# Patient Record
Sex: Male | Born: 1955 | Race: White | Hispanic: No | Marital: Married | Smoking: Former smoker
Health system: Southern US, Community
[De-identification: ages and names within clinical notes are randomized; demographics above are authoritative.]

## PROBLEM LIST (undated history)

## (undated) DIAGNOSIS — E119 Type 2 diabetes mellitus without complications: Secondary | ICD-10-CM

## (undated) DIAGNOSIS — I2510 Atherosclerotic heart disease of native coronary artery without angina pectoris: Secondary | ICD-10-CM

## (undated) DIAGNOSIS — E785 Hyperlipidemia, unspecified: Secondary | ICD-10-CM

## (undated) DIAGNOSIS — Z789 Other specified health status: Secondary | ICD-10-CM

## (undated) DIAGNOSIS — R570 Cardiogenic shock: Secondary | ICD-10-CM

## (undated) DIAGNOSIS — I509 Heart failure, unspecified: Secondary | ICD-10-CM

## (undated) HISTORY — PX: NO PAST SURGERIES: SHX2092

## (undated) HISTORY — PX: CARDIAC CATHETERIZATION: SHX172

## (undated) HISTORY — DX: Cardiogenic shock: R57.0

---

## 2005-01-17 ENCOUNTER — Emergency Department (HOSPITAL_COMMUNITY): Admission: EM | Admit: 2005-01-17 | Discharge: 2005-01-17 | Payer: Self-pay | Admitting: Emergency Medicine

## 2005-08-30 ENCOUNTER — Emergency Department (HOSPITAL_COMMUNITY): Admission: EM | Admit: 2005-08-30 | Discharge: 2005-08-30 | Payer: Self-pay | Admitting: Emergency Medicine

## 2010-08-21 ENCOUNTER — Emergency Department (HOSPITAL_COMMUNITY)
Admission: EM | Admit: 2010-08-21 | Discharge: 2010-08-21 | Payer: Self-pay | Source: Home / Self Care | Admitting: Emergency Medicine

## 2010-11-01 LAB — POCT I-STAT, CHEM 8
BUN: 15 mg/dL (ref 6–23)
Calcium, Ion: 1.12 mmol/L (ref 1.12–1.32)
HCT: 48 % (ref 39.0–52.0)
Hemoglobin: 16.3 g/dL (ref 13.0–17.0)
Sodium: 139 mEq/L (ref 135–145)

## 2010-11-01 LAB — PROTIME-INR
INR: 0.95 (ref 0.00–1.49)
Prothrombin Time: 12.9 seconds (ref 11.6–15.2)

## 2010-11-01 LAB — DIFFERENTIAL
Basophils Relative: 0 % (ref 0–1)
Eosinophils Absolute: 0.2 10*3/uL (ref 0.0–0.7)
Eosinophils Relative: 1 % (ref 0–5)
Lymphs Abs: 1.8 10*3/uL (ref 0.7–4.0)
Monocytes Absolute: 0.8 10*3/uL (ref 0.1–1.0)
Monocytes Relative: 7 % (ref 3–12)

## 2010-11-01 LAB — CBC
MCV: 89.8 fL (ref 78.0–100.0)
RDW: 13.6 % (ref 11.5–15.5)
WBC: 11.2 10*3/uL — ABNORMAL HIGH (ref 4.0–10.5)

## 2014-01-08 ENCOUNTER — Emergency Department (HOSPITAL_COMMUNITY)
Admission: EM | Admit: 2014-01-08 | Discharge: 2014-01-09 | Disposition: A | Discharge status: Home / Self Care | Payer: Self-pay | Attending: Emergency Medicine | Admitting: Emergency Medicine

## 2014-01-08 ENCOUNTER — Emergency Department (HOSPITAL_COMMUNITY)
Admission: EM | Admit: 2014-01-08 | Discharge: 2014-01-08 | Discharge status: Against Medical Advice | Payer: Self-pay | Attending: Emergency Medicine | Admitting: Emergency Medicine

## 2014-01-08 ENCOUNTER — Encounter (HOSPITAL_COMMUNITY): Payer: Self-pay | Admitting: Emergency Medicine

## 2014-01-08 DIAGNOSIS — Y939 Activity, unspecified: Secondary | ICD-10-CM | POA: Insufficient documentation

## 2014-01-08 DIAGNOSIS — X58XXXA Exposure to other specified factors, initial encounter: Secondary | ICD-10-CM | POA: Insufficient documentation

## 2014-01-08 DIAGNOSIS — Y929 Unspecified place or not applicable: Secondary | ICD-10-CM | POA: Insufficient documentation

## 2014-01-08 DIAGNOSIS — F17200 Nicotine dependence, unspecified, uncomplicated: Secondary | ICD-10-CM | POA: Insufficient documentation

## 2014-01-08 DIAGNOSIS — Y9389 Activity, other specified: Secondary | ICD-10-CM | POA: Insufficient documentation

## 2014-01-08 DIAGNOSIS — S058X9A Other injuries of unspecified eye and orbit, initial encounter: Secondary | ICD-10-CM | POA: Insufficient documentation

## 2014-01-08 DIAGNOSIS — T1590XA Foreign body on external eye, part unspecified, unspecified eye, initial encounter: Secondary | ICD-10-CM | POA: Insufficient documentation

## 2014-01-08 DIAGNOSIS — S0500XA Injury of conjunctiva and corneal abrasion without foreign body, unspecified eye, initial encounter: Secondary | ICD-10-CM

## 2014-01-08 MED ORDER — TETRACAINE HCL 0.5 % OP SOLN
1.0000 [drp] | Freq: Once | OPHTHALMIC | Status: AC
  Administered 2014-01-08: 1 [drp] via OPHTHALMIC
  Filled 2014-01-08: qty 2

## 2014-01-08 MED ORDER — FLUORESCEIN SODIUM 1 MG OP STRP
1.0000 | ORAL_STRIP | Freq: Once | OPHTHALMIC | Status: AC
  Administered 2014-01-08: 1 via OPHTHALMIC
  Filled 2014-01-08: qty 1

## 2014-01-08 NOTE — ED Notes (Signed)
Pt states he has metal shard in R eye after using metal grinder today @ 1600, eye red, no vision changes

## 2014-01-08 NOTE — ED Notes (Signed)
Patient angry and not wanting to wait.  Patient left ED at this time.

## 2014-01-08 NOTE — ED Notes (Signed)
Patient states that he may have a piece of metal in right eye.  Eye is red and tearing.

## 2014-01-09 MED ORDER — ERYTHROMYCIN 5 MG/GM OP OINT: TOPICAL_OINTMENT | Freq: Three times a day (TID) | OPHTHALMIC | Status: DC

## 2014-01-09 MED ORDER — TETRACAINE HCL 0.5 % OP SOLN
1.0000 [drp] | Freq: Once | OPHTHALMIC | Status: AC
  Administered 2014-01-09: 1 [drp] via OPHTHALMIC
  Filled 2014-01-09: qty 2

## 2014-01-09 MED ORDER — TETRACAINE HCL 0.5 % OP SOLN: 1.0000 [drp] | Freq: Once | OPHTHALMIC | Status: DC

## 2014-01-09 MED ORDER — ERYTHROMYCIN 5 MG/GM OP OINT
TOPICAL_OINTMENT | Freq: Once | OPHTHALMIC | Status: AC
  Administered 2014-01-09: 01:00:00 via OPHTHALMIC
  Filled 2014-01-09: qty 3.5

## 2014-01-09 NOTE — ED Provider Notes (Signed)
Medical screening examination/treatment/procedure(s) were performed by non-physician practitioner and as supervising physician I was immediately available for consultation/collaboration.   EKG Interpretation None       Kalman Drape, MD 01/09/14 [phone]

## 2014-01-09 NOTE — Discharge Instructions (Signed)
Abrasions An abrasion is a cut or scrape of the skin. Abrasions do not go through all layers of the skin. HOME CARE  If a bandage (dressing) was put on your wound, change it as told by your doctor. If the bandage sticks, soak it off with warm.  Wash the area with water and soap 2 times a day. Rinse off the soap. Pat the area dry with a clean towel.  Put on medicated cream (ointment) as told by your doctor.  Change your bandage right away if it gets wet or dirty.  Only take medicine as told by your doctor.  See your doctor within 24 48 hours to get your wound checked.  Check your wound for redness, puffiness (swelling), or yellowish-white fluid (pus). GET HELP RIGHT AWAY IF:   You have more pain in the wound.  You have redness, swelling, or tenderness around the wound.  You have pus coming from the wound.  You have a fever or lasting symptoms for more than 2 3 days.  You have a fever and your symptoms suddenly get worse.  You have a bad smell coming from the wound or bandage. MAKE SURE YOU:   Understand these instructions.  Will watch your condition.  Will get help right away if you are not doing well or get worse. Document Released: 01/25/2008 Document Revised: 05/02/2012 Document Reviewed: 07/12/2011 [facility] Patient Information 2014 [place], [place]. You have a small abrasion on the inside of your lower Right eye lid  Please use the supplied eye ointment 3 times a day for the next 5 days   You have also been referred to an eye doctor for follow up examination

## 2014-01-09 NOTE — ED Provider Notes (Signed)
CSN: [id number]     Arrival date & time 01/08/14  2313 History   First MD Initiated Contact with Patient 01/09/14 0012     Chief Complaint  Patient presents with  . Foreign Body in [place]     (Consider location/radiation/quality/duration/timing/severity/associated sxs/prior Treatment) HPI Comments: Patient was grinding today felt something hit his E eye  Pain and redness has gradually gotten worse over the day   Patient is a 58 y.o. male presenting with foreign body in eye. The history is provided by the patient.  Foreign Body in Eye This is a new problem. The current episode started today. The problem occurs constantly. The problem has been gradually worsening. Pertinent negatives include no fever, headaches, neck pain or rash. Exacerbated by: blinking. He has tried nothing for the symptoms. The treatment provided no relief.    History reviewed. No pertinent past medical history. History reviewed. No pertinent past surgical history. No family history on file. History  Substance Use Topics  . Smoking status: Current Every Day Smoker  . Smokeless tobacco: Not on file  . Alcohol Use: No    Review of Systems  Constitutional: Negative for fever.  Eyes: Positive for pain and redness. Negative for photophobia and visual disturbance.  Musculoskeletal: Negative for neck pain.  Skin: Negative for rash and wound.  Neurological: Negative for dizziness and headaches.  All other systems reviewed and are negative.     Allergies  Review of patient's allergies indicates no known allergies.  Home Medications   Prior to Admission medications   Medication Sig Start Date End Date Taking? Authorizing Provider  ibuprofen (ADVIL,MOTRIN) 200 MG tablet Take 400 mg by mouth every 6 (six) hours as needed (for pain.).   Yes Historical Provider, MD   BP 153/87  Pulse 67  Temp(Src) 97.7 F (36.5 C) (Oral)  Resp 16  SpO2 94% Physical Exam  Nursing note and vitals reviewed. Constitutional: He  appears well-developed and well-nourished.  HENT:  Head: Normocephalic.  Eyes: EOM are normal. Pupils are equal, round, and reactive to light. Lids are everted and swept, no foreign bodies found. Right eye exhibits no discharge. No foreign body present in the right eye. Left eye exhibits no discharge. Right conjunctiva is injected.      ED Course  Procedures (including critical care time) Labs Review Labs Reviewed - No data to display  Imaging Review No results found.   EKG Interpretation None      MDM  NO FB identified no fluid leak to make me think of globe puncture.  Started on Erythromycin ointment and opthalomology follow up  Final diagnoses:  Conjunctival abrasion         Garald Balding, NP 01/09/14 [phone]

## 2019-03-31 ENCOUNTER — Inpatient Hospital Stay (HOSPITAL_COMMUNITY)
Admission: EM | Disposition: A | Discharge status: Home Health | Payer: Self-pay | Source: Home / Self Care | Attending: Cardiovascular Disease

## 2019-03-31 ENCOUNTER — Inpatient Hospital Stay (HOSPITAL_COMMUNITY)
Admission: EM | Admit: 2019-03-31 | Discharge: 2019-04-16 | DRG: 215 | Disposition: A | Discharge status: Home Health | Payer: Self-pay | Attending: Cardiovascular Disease | Admitting: Cardiovascular Disease

## 2019-03-31 ENCOUNTER — Emergency Department (HOSPITAL_COMMUNITY): Payer: Self-pay

## 2019-03-31 ENCOUNTER — Encounter (HOSPITAL_COMMUNITY): Payer: Self-pay | Admitting: Cardiovascular Disease

## 2019-03-31 ENCOUNTER — Other Ambulatory Visit: Payer: Self-pay

## 2019-03-31 ENCOUNTER — Inpatient Hospital Stay (HOSPITAL_COMMUNITY): Payer: Self-pay

## 2019-03-31 DIAGNOSIS — I361 Nonrheumatic tricuspid (valve) insufficiency: Secondary | ICD-10-CM

## 2019-03-31 DIAGNOSIS — J690 Pneumonitis due to inhalation of food and vomit: Secondary | ICD-10-CM | POA: Diagnosis not present

## 2019-03-31 DIAGNOSIS — Z95818 Presence of other cardiac implants and grafts: Secondary | ICD-10-CM

## 2019-03-31 DIAGNOSIS — R402352 Coma scale, best motor response, localizes pain, at arrival to emergency department: Secondary | ICD-10-CM | POA: Diagnosis present

## 2019-03-31 DIAGNOSIS — I2510 Atherosclerotic heart disease of native coronary artery without angina pectoris: Secondary | ICD-10-CM | POA: Diagnosis present

## 2019-03-31 DIAGNOSIS — R7309 Other abnormal glucose: Secondary | ICD-10-CM

## 2019-03-31 DIAGNOSIS — J810 Acute pulmonary edema: Secondary | ICD-10-CM

## 2019-03-31 DIAGNOSIS — D62 Acute posthemorrhagic anemia: Secondary | ICD-10-CM | POA: Diagnosis not present

## 2019-03-31 DIAGNOSIS — F17200 Nicotine dependence, unspecified, uncomplicated: Secondary | ICD-10-CM | POA: Diagnosis present

## 2019-03-31 DIAGNOSIS — J9690 Respiratory failure, unspecified, unspecified whether with hypoxia or hypercapnia: Secondary | ICD-10-CM

## 2019-03-31 DIAGNOSIS — Z978 Presence of other specified devices: Secondary | ICD-10-CM

## 2019-03-31 DIAGNOSIS — T17910A Gastric contents in respiratory tract, part unspecified causing asphyxiation, initial encounter: Secondary | ICD-10-CM

## 2019-03-31 DIAGNOSIS — I493 Ventricular premature depolarization: Secondary | ICD-10-CM | POA: Diagnosis not present

## 2019-03-31 DIAGNOSIS — R570 Cardiogenic shock: Secondary | ICD-10-CM

## 2019-03-31 DIAGNOSIS — R402212 Coma scale, best verbal response, none, at arrival to emergency department: Secondary | ICD-10-CM | POA: Diagnosis present

## 2019-03-31 DIAGNOSIS — E1165 Type 2 diabetes mellitus with hyperglycemia: Secondary | ICD-10-CM | POA: Diagnosis present

## 2019-03-31 DIAGNOSIS — J9601 Acute respiratory failure with hypoxia: Secondary | ICD-10-CM

## 2019-03-31 DIAGNOSIS — J151 Pneumonia due to Pseudomonas: Secondary | ICD-10-CM | POA: Diagnosis not present

## 2019-03-31 DIAGNOSIS — R402142 Coma scale, eyes open, spontaneous, at arrival to emergency department: Secondary | ICD-10-CM | POA: Diagnosis present

## 2019-03-31 DIAGNOSIS — R140 Abdominal distension (gaseous): Secondary | ICD-10-CM

## 2019-03-31 DIAGNOSIS — I4891 Unspecified atrial fibrillation: Secondary | ICD-10-CM | POA: Diagnosis present

## 2019-03-31 DIAGNOSIS — N179 Acute kidney failure, unspecified: Secondary | ICD-10-CM | POA: Diagnosis present

## 2019-03-31 DIAGNOSIS — Z9289 Personal history of other medical treatment: Secondary | ICD-10-CM

## 2019-03-31 DIAGNOSIS — I371 Nonrheumatic pulmonary valve insufficiency: Secondary | ICD-10-CM

## 2019-03-31 DIAGNOSIS — G9341 Metabolic encephalopathy: Secondary | ICD-10-CM | POA: Diagnosis not present

## 2019-03-31 DIAGNOSIS — I110 Hypertensive heart disease with heart failure: Secondary | ICD-10-CM | POA: Diagnosis present

## 2019-03-31 DIAGNOSIS — Z9911 Dependence on respirator [ventilator] status: Secondary | ICD-10-CM

## 2019-03-31 DIAGNOSIS — Z781 Physical restraint status: Secondary | ICD-10-CM

## 2019-03-31 DIAGNOSIS — R410 Disorientation, unspecified: Secondary | ICD-10-CM | POA: Diagnosis not present

## 2019-03-31 DIAGNOSIS — J440 Chronic obstructive pulmonary disease with acute lower respiratory infection: Secondary | ICD-10-CM | POA: Diagnosis not present

## 2019-03-31 DIAGNOSIS — I469 Cardiac arrest, cause unspecified: Secondary | ICD-10-CM

## 2019-03-31 DIAGNOSIS — D696 Thrombocytopenia, unspecified: Secondary | ICD-10-CM | POA: Diagnosis not present

## 2019-03-31 DIAGNOSIS — Z20828 Contact with and (suspected) exposure to other viral communicable diseases: Secondary | ICD-10-CM | POA: Diagnosis present

## 2019-03-31 DIAGNOSIS — I2102 ST elevation (STEMI) myocardial infarction involving left anterior descending coronary artery: Principal | ICD-10-CM | POA: Diagnosis present

## 2019-03-31 DIAGNOSIS — I255 Ischemic cardiomyopathy: Secondary | ICD-10-CM | POA: Diagnosis present

## 2019-03-31 DIAGNOSIS — Z01818 Encounter for other preprocedural examination: Secondary | ICD-10-CM

## 2019-03-31 DIAGNOSIS — N289 Disorder of kidney and ureter, unspecified: Secondary | ICD-10-CM

## 2019-03-31 DIAGNOSIS — J95851 Ventilator associated pneumonia: Secondary | ICD-10-CM | POA: Diagnosis not present

## 2019-03-31 DIAGNOSIS — Y848 Other medical procedures as the cause of abnormal reaction of the patient, or of later complication, without mention of misadventure at the time of the procedure: Secondary | ICD-10-CM | POA: Diagnosis not present

## 2019-03-31 DIAGNOSIS — Z955 Presence of coronary angioplasty implant and graft: Secondary | ICD-10-CM

## 2019-03-31 DIAGNOSIS — I5021 Acute systolic (congestive) heart failure: Secondary | ICD-10-CM | POA: Diagnosis present

## 2019-03-31 DIAGNOSIS — I213 ST elevation (STEMI) myocardial infarction of unspecified site: Secondary | ICD-10-CM | POA: Diagnosis present

## 2019-03-31 DIAGNOSIS — I472 Ventricular tachycardia: Secondary | ICD-10-CM | POA: Diagnosis not present

## 2019-03-31 DIAGNOSIS — G9340 Encephalopathy, unspecified: Secondary | ICD-10-CM

## 2019-03-31 DIAGNOSIS — J811 Chronic pulmonary edema: Secondary | ICD-10-CM

## 2019-03-31 DIAGNOSIS — N189 Chronic kidney disease, unspecified: Secondary | ICD-10-CM

## 2019-03-31 DIAGNOSIS — Z452 Encounter for adjustment and management of vascular access device: Secondary | ICD-10-CM

## 2019-03-31 HISTORY — PX: LEFT HEART CATH AND CORONARY ANGIOGRAPHY: CATH118249

## 2019-03-31 HISTORY — PX: CORONARY/GRAFT ACUTE MI REVASCULARIZATION: CATH118305

## 2019-03-31 HISTORY — PX: RIGHT HEART CATH: CATH118263

## 2019-03-31 HISTORY — DX: Other specified health status: Z78.9

## 2019-03-31 HISTORY — PX: VENTRICULAR ASSIST DEVICE INSERTION: CATH118273

## 2019-03-31 LAB — HEPATIC FUNCTION PANEL
ALT: 28 U/L (ref 0–44)
AST: 45 U/L — ABNORMAL HIGH (ref 15–41)
Albumin: 3.9 g/dL (ref 3.5–5.0)
Alkaline Phosphatase: 109 U/L (ref 38–126)
Bilirubin, Direct: 0.2 mg/dL (ref 0.0–0.2)
Indirect Bilirubin: 0.8 mg/dL (ref 0.3–0.9)
Total Bilirubin: 1 mg/dL (ref 0.3–1.2)
Total Protein: 7.8 g/dL (ref 6.5–8.1)

## 2019-03-31 LAB — BASIC METABOLIC PANEL
Anion gap: 13 (ref 5–15)
Anion gap: 9 (ref 5–15)
Anion gap: 8 (ref 5–15)
BUN: 16 mg/dL (ref 8–23)
BUN: 17 mg/dL (ref 8–23)
BUN: 18 mg/dL (ref 8–23)
CO2: 24 mmol/L (ref 22–32)
CO2: 19 mmol/L — ABNORMAL LOW (ref 22–32)
CO2: 20 mmol/L — ABNORMAL LOW (ref 22–32)
Calcium: 8.8 mg/dL — ABNORMAL LOW (ref 8.9–10.3)
Calcium: 7.4 mg/dL — ABNORMAL LOW (ref 8.9–10.3)
Calcium: 7.6 mg/dL — ABNORMAL LOW (ref 8.9–10.3)
Chloride: 102 mmol/L (ref 98–111)
Chloride: 112 mmol/L — ABNORMAL HIGH (ref 98–111)
Chloride: 115 mmol/L — ABNORMAL HIGH (ref 98–111)
Creatinine, Ser: 1.72 mg/dL — ABNORMAL HIGH (ref 0.61–1.24)
Creatinine, Ser: 1.29 mg/dL — ABNORMAL HIGH (ref 0.61–1.24)
Creatinine, Ser: 1.29 mg/dL — ABNORMAL HIGH (ref 0.61–1.24)
GFR calc Af Amer: 48 mL/min — ABNORMAL LOW (ref >60)
GFR calc Af Amer: >60 mL/min (ref >60)
GFR calc Af Amer: >60 mL/min (ref >60)
GFR calc non Af Amer: 42 mL/min — ABNORMAL LOW (ref >60)
GFR calc non Af Amer: 59 mL/min — ABNORMAL LOW (ref >60)
GFR calc non Af Amer: 59 mL/min — ABNORMAL LOW (ref >60)
Glucose, Bld: 340 mg/dL — ABNORMAL HIGH (ref 70–99)
Glucose, Bld: 187 mg/dL — ABNORMAL HIGH (ref 70–99)
Glucose, Bld: 129 mg/dL — ABNORMAL HIGH (ref 70–99)
Potassium: 3.9 mmol/L (ref 3.5–5.1)
Potassium: 4.3 mmol/L (ref 3.5–5.1)
Potassium: 3.9 mmol/L (ref 3.5–5.1)
Sodium: 139 mmol/L (ref 135–145)
Sodium: 140 mmol/L (ref 135–145)
Sodium: 143 mmol/L (ref 135–145)

## 2019-03-31 LAB — CBC
HCT: 38.9 % — ABNORMAL LOW (ref 39.0–52.0)
HCT: 36.9 % — ABNORMAL LOW (ref 39.0–52.0)
Hemoglobin: 12.8 g/dL — ABNORMAL LOW (ref 13.0–17.0)
Hemoglobin: 12.1 g/dL — ABNORMAL LOW (ref 13.0–17.0)
MCH: 31 pg (ref 26.0–34.0)
MCH: 30.9 pg (ref 26.0–34.0)
MCHC: 32.9 g/dL (ref 30.0–36.0)
MCHC: 32.8 g/dL (ref 30.0–36.0)
MCV: 94.2 fL (ref 80.0–100.0)
MCV: 94.1 fL (ref 80.0–100.0)
Platelets: 330 10*3/uL (ref 150–400)
Platelets: 291 10*3/uL (ref 150–400)
RBC: 4.13 MIL/uL — ABNORMAL LOW (ref 4.22–5.81)
RBC: 3.92 MIL/uL — ABNORMAL LOW (ref 4.22–5.81)
RDW: 14.2 % (ref 11.5–15.5)
RDW: 14.2 % (ref 11.5–15.5)
WBC: 14.5 10*3/uL — ABNORMAL HIGH (ref 4.0–10.5)
WBC: 14 10*3/uL — ABNORMAL HIGH (ref 4.0–10.5)
nRBC: 0 % (ref 0.0–0.2)
nRBC: 0 % (ref 0.0–0.2)

## 2019-03-31 LAB — POCT I-STAT 7, (LYTES, BLD GAS, ICA,H+H)
Acid-base deficit: 10 mmol/L — ABNORMAL HIGH (ref 0.0–2.0)
Acid-base deficit: 14 mmol/L — ABNORMAL HIGH (ref 0.0–2.0)
Acid-base deficit: 3 mmol/L — ABNORMAL HIGH (ref 0.0–2.0)
Acid-base deficit: 3 mmol/L — ABNORMAL HIGH (ref 0.0–2.0)
Bicarbonate: 19.2 mmol/L — ABNORMAL LOW (ref 20.0–28.0)
Bicarbonate: 20.6 mmol/L (ref 20.0–28.0)
Bicarbonate: 26.1 mmol/L (ref 20.0–28.0)
Bicarbonate: 20.5 mmol/L (ref 20.0–28.0)
Calcium, Ion: 1.01 mmol/L — ABNORMAL LOW (ref 1.15–1.40)
Calcium, Ion: 1.03 mmol/L — ABNORMAL LOW (ref 1.15–1.40)
Calcium, Ion: 1.07 mmol/L — ABNORMAL LOW (ref 1.15–1.40)
Calcium, Ion: 1.09 mmol/L — ABNORMAL LOW (ref 1.15–1.40)
HCT: 37 % — ABNORMAL LOW (ref 39.0–52.0)
HCT: 41 % (ref 39.0–52.0)
HCT: 39 % (ref 39.0–52.0)
HCT: 34 % — ABNORMAL LOW (ref 39.0–52.0)
Hemoglobin: 12.6 g/dL — ABNORMAL LOW (ref 13.0–17.0)
Hemoglobin: 13.9 g/dL (ref 13.0–17.0)
Hemoglobin: 13.3 g/dL (ref 13.0–17.0)
Hemoglobin: 11.6 g/dL — ABNORMAL LOW (ref 13.0–17.0)
O2 Saturation: 100 %
O2 Saturation: 100 %
O2 Saturation: 100 %
O2 Saturation: 95 %
Patient temperature: 38
Potassium: 3.2 mmol/L — ABNORMAL LOW (ref 3.5–5.1)
Potassium: 3.5 mmol/L (ref 3.5–5.1)
Potassium: 4.7 mmol/L (ref 3.5–5.1)
Potassium: 4.1 mmol/L (ref 3.5–5.1)
Sodium: 120 mmol/L — ABNORMAL LOW (ref 135–145)
Sodium: 127 mmol/L — ABNORMAL LOW (ref 135–145)
Sodium: 145 mmol/L (ref 135–145)
Sodium: 146 mmol/L — ABNORMAL HIGH (ref 135–145)
TCO2: 22 mmol/L (ref 22–32)
TCO2: 23 mmol/L (ref 22–32)
TCO2: 28 mmol/L (ref 22–32)
TCO2: 21 mmol/L — ABNORMAL LOW (ref 22–32)
pCO2 arterial: 71.1 mmHg (ref 32.0–48.0)
pCO2 arterial: 84.8 mmHg (ref 32.0–48.0)
pCO2 arterial: 64.2 mmHg — ABNORMAL HIGH (ref 32.0–48.0)
pCO2 arterial: 31.2 mmHg — ABNORMAL LOW (ref 32.0–48.0)
pH, Arterial: 6.964 — CL (ref 7.350–7.450)
pH, Arterial: 7.069 — CL (ref 7.350–7.450)
pH, Arterial: 7.217 — ABNORMAL LOW (ref 7.350–7.450)
pH, Arterial: 7.43 (ref 7.350–7.450)
pO2, Arterial: 285 mmHg — ABNORMAL HIGH (ref 83.0–108.0)
pO2, Arterial: 329 mmHg — ABNORMAL HIGH (ref 83.0–108.0)
pO2, Arterial: 291 mmHg — ABNORMAL HIGH (ref 83.0–108.0)
pO2, Arterial: 75 mmHg — ABNORMAL LOW (ref 83.0–108.0)

## 2019-03-31 LAB — TROPONIN I (HIGH SENSITIVITY)
Troponin I (High Sensitivity): 465 ng/L (ref <18)
Troponin I (High Sensitivity): 5839 ng/L (ref <18)
Troponin I (High Sensitivity): 15081 ng/L (ref <18)
Troponin I (High Sensitivity): 17032 ng/L (ref <18)

## 2019-03-31 LAB — COOXEMETRY PANEL
Carboxyhemoglobin: 1.9 % — ABNORMAL HIGH (ref 0.5–1.5)
Carboxyhemoglobin: 1.3 % (ref 0.5–1.5)
Methemoglobin: 1.3 % (ref 0.0–1.5)
Methemoglobin: 1 % (ref 0.0–1.5)
O2 Saturation: 86.3 %
O2 Saturation: 76.5 %
Total hemoglobin: 12.3 g/dL (ref 12.0–16.0)
Total hemoglobin: 11.7 g/dL — ABNORMAL LOW (ref 12.0–16.0)

## 2019-03-31 LAB — URINALYSIS, ROUTINE W REFLEX MICROSCOPIC
RBC / HPF: >50 RBC/hpf — ABNORMAL HIGH (ref 0–5)
WBC, UA: >50 WBC/hpf — ABNORMAL HIGH (ref 0–5)

## 2019-03-31 LAB — CBC WITH DIFFERENTIAL/PLATELET
Abs Immature Granulocytes: 0.07 10*3/uL (ref 0.00–0.07)
Abs Immature Granulocytes: 0.09 10*3/uL — ABNORMAL HIGH (ref 0.00–0.07)
Basophils Absolute: 0 10*3/uL (ref 0.0–0.1)
Basophils Absolute: 0.1 10*3/uL (ref 0.0–0.1)
Basophils Relative: 0 %
Basophils Relative: 1 %
Eosinophils Absolute: 0 10*3/uL (ref 0.0–0.5)
Eosinophils Absolute: 0.5 10*3/uL (ref 0.0–0.5)
Eosinophils Relative: 0 %
Eosinophils Relative: 3 %
HCT: 37.4 % — ABNORMAL LOW (ref 39.0–52.0)
HCT: 52.6 % — ABNORMAL HIGH (ref 39.0–52.0)
Hemoglobin: 11.9 g/dL — ABNORMAL LOW (ref 13.0–17.0)
Hemoglobin: 17 g/dL (ref 13.0–17.0)
Immature Granulocytes: 1 %
Immature Granulocytes: 1 %
Lymphocytes Relative: 44 %
Lymphocytes Relative: 9 %
Lymphs Abs: 1.4 10*3/uL (ref 0.7–4.0)
Lymphs Abs: 6.8 10*3/uL — ABNORMAL HIGH (ref 0.7–4.0)
MCH: 30.8 pg (ref 26.0–34.0)
MCH: 31.2 pg (ref 26.0–34.0)
MCHC: 31.8 g/dL (ref 30.0–36.0)
MCHC: 32.3 g/dL (ref 30.0–36.0)
MCV: 96.5 fL (ref 80.0–100.0)
MCV: 96.9 fL (ref 80.0–100.0)
Monocytes Absolute: 1 10*3/uL (ref 0.1–1.0)
Monocytes Absolute: 1.4 10*3/uL — ABNORMAL HIGH (ref 0.1–1.0)
Monocytes Relative: 7 %
Monocytes Relative: 9 %
Neutro Abs: 13.8 10*3/uL — ABNORMAL HIGH (ref 1.7–7.7)
Neutro Abs: 6.6 10*3/uL (ref 1.7–7.7)
Neutrophils Relative %: 44 %
Neutrophils Relative %: 81 %
Platelets: 314 10*3/uL (ref 150–400)
Platelets: 388 10*3/uL (ref 150–400)
RBC: 3.86 MIL/uL — ABNORMAL LOW (ref 4.22–5.81)
RBC: 5.45 MIL/uL (ref 4.22–5.81)
RDW: 14 % (ref 11.5–15.5)
RDW: 14.2 % (ref 11.5–15.5)
WBC: 15 10*3/uL — ABNORMAL HIGH (ref 4.0–10.5)
WBC: 16.8 10*3/uL — ABNORMAL HIGH (ref 4.0–10.5)
nRBC: 0 % (ref 0.0–0.2)
nRBC: 0 % (ref 0.0–0.2)

## 2019-03-31 LAB — GLUCOSE, CAPILLARY
Glucose-Capillary: 228 mg/dL — ABNORMAL HIGH (ref 70–99)
Glucose-Capillary: 245 mg/dL — ABNORMAL HIGH (ref 70–99)
Glucose-Capillary: 191 mg/dL — ABNORMAL HIGH (ref 70–99)
Glucose-Capillary: 113 mg/dL — ABNORMAL HIGH (ref 70–99)
Glucose-Capillary: 147 mg/dL — ABNORMAL HIGH (ref 70–99)

## 2019-03-31 LAB — POCT ACTIVATED CLOTTING TIME
Activated Clotting Time: 390 seconds
Activated Clotting Time: 235 seconds
Activated Clotting Time: 202 seconds
Activated Clotting Time: 186 seconds
Activated Clotting Time: 175 seconds
Activated Clotting Time: 164 seconds
Activated Clotting Time: 158 seconds
Activated Clotting Time: 169 seconds
Activated Clotting Time: 158 seconds
Activated Clotting Time: 164 seconds
Activated Clotting Time: 158 seconds
Activated Clotting Time: 164 seconds
Activated Clotting Time: 158 seconds
Activated Clotting Time: 158 seconds
Activated Clotting Time: 158 seconds
Activated Clotting Time: 158 seconds

## 2019-03-31 LAB — TSH: TSH: 0.887 u[IU]/mL (ref 0.350–4.500)

## 2019-03-31 LAB — LIPID PANEL
Cholesterol: 253 mg/dL — ABNORMAL HIGH (ref 0–200)
HDL: 46 mg/dL (ref >40)
LDL Cholesterol: 188 mg/dL — ABNORMAL HIGH (ref 0–99)
Total CHOL/HDL Ratio: 5.5 RATIO
Triglycerides: 94 mg/dL (ref <150)
VLDL: 19 mg/dL (ref 0–40)

## 2019-03-31 LAB — POCT I-STAT EG7
Acid-base deficit: 5 mmol/L — ABNORMAL HIGH (ref 0.0–2.0)
Bicarbonate: 24.4 mmol/L (ref 20.0–28.0)
Calcium, Ion: 1.1 mmol/L — ABNORMAL LOW (ref 1.15–1.40)
HCT: 36 % — ABNORMAL LOW (ref 39.0–52.0)
Hemoglobin: 12.2 g/dL — ABNORMAL LOW (ref 13.0–17.0)
O2 Saturation: 51 %
Potassium: 4.8 mmol/L (ref 3.5–5.1)
Sodium: 144 mmol/L (ref 135–145)
TCO2: 26 mmol/L (ref 22–32)
pCO2, Ven: 66.1 mmHg — ABNORMAL HIGH (ref 44.0–60.0)
pH, Ven: 7.175 — CL (ref 7.250–7.430)
pO2, Ven: 35 mmHg (ref 32.0–45.0)

## 2019-03-31 LAB — COMPREHENSIVE METABOLIC PANEL
ALT: 18 U/L (ref 0–44)
AST: 55 U/L — ABNORMAL HIGH (ref 15–41)
Albumin: 2.4 g/dL — ABNORMAL LOW (ref 3.5–5.0)
Alkaline Phosphatase: 70 U/L (ref 38–126)
Anion gap: 9 (ref 5–15)
BUN: 17 mg/dL (ref 8–23)
CO2: 20 mmol/L — ABNORMAL LOW (ref 22–32)
Calcium: 6.6 mg/dL — ABNORMAL LOW (ref 8.9–10.3)
Chloride: 113 mmol/L — ABNORMAL HIGH (ref 98–111)
Creatinine, Ser: 1.19 mg/dL (ref 0.61–1.24)
GFR calc Af Amer: >60 mL/min (ref >60)
GFR calc non Af Amer: >60 mL/min (ref >60)
Glucose, Bld: 244 mg/dL — ABNORMAL HIGH (ref 70–99)
Potassium: 3.7 mmol/L (ref 3.5–5.1)
Sodium: 142 mmol/L (ref 135–145)
Total Bilirubin: 0.8 mg/dL (ref 0.3–1.2)
Total Protein: 4.5 g/dL — ABNORMAL LOW (ref 6.5–8.1)

## 2019-03-31 LAB — PROTIME-INR
INR: >10 (ref 0.8–1.2)
Prothrombin Time: 88.3 seconds — ABNORMAL HIGH (ref 11.4–15.2)

## 2019-03-31 LAB — APTT: aPTT: 176 seconds (ref 24–36)

## 2019-03-31 LAB — MRSA PCR SCREENING: MRSA by PCR: NEGATIVE

## 2019-03-31 LAB — ECHOCARDIOGRAM COMPLETE
Height: 69.016 in
Weight: 2960 oz

## 2019-03-31 LAB — HEMOGLOBIN A1C
Hgb A1c MFr Bld: 6 % — ABNORMAL HIGH (ref 4.8–5.6)
Mean Plasma Glucose: 125.5 mg/dL

## 2019-03-31 LAB — MAGNESIUM
Magnesium: 1.8 mg/dL (ref 1.7–2.4)
Magnesium: 1.8 mg/dL (ref 1.7–2.4)

## 2019-03-31 LAB — LACTIC ACID, PLASMA
Lactic Acid, Venous: 3 mmol/L (ref 0.5–1.9)
Lactic Acid, Venous: 3.5 mmol/L (ref 0.5–1.9)
Lactic Acid, Venous: 2.5 mmol/L (ref 0.5–1.9)

## 2019-03-31 LAB — BILIRUBIN, DIRECT: Bilirubin, Direct: 0.3 mg/dL — ABNORMAL HIGH (ref 0.0–0.2)

## 2019-03-31 LAB — LACTATE DEHYDROGENASE: LDH: 273 U/L — ABNORMAL HIGH (ref 98–192)

## 2019-03-31 LAB — SARS CORONAVIRUS 2 BY RT PCR (HOSPITAL ORDER, PERFORMED IN ~~LOC~~ HOSPITAL LAB): SARS Coronavirus 2: NEGATIVE

## 2019-03-31 LAB — BRAIN NATRIURETIC PEPTIDE: B Natriuretic Peptide: 1247.4 pg/mL — ABNORMAL HIGH (ref 0.0–100.0)

## 2019-03-31 SURGERY — CORONARY/GRAFT ACUTE MI REVASCULARIZATION
Anesthesia: LOCAL

## 2019-03-31 MED ORDER — PROPOFOL 1000 MG/100ML IV EMUL: 5.0000 ug/kg/min | INTRAVENOUS | Status: DC

## 2019-03-31 MED ORDER — PRO-STAT SUGAR FREE PO LIQD
30.0000 mL | Freq: Two times a day (BID) | ORAL | Status: DC
  Administered 2019-03-31 – 2019-04-01 (×3): 30 mL
  Filled 2019-03-31 (×3): qty 30

## 2019-03-31 MED ORDER — VITAL HIGH PROTEIN PO LIQD
1000.0000 mL | ORAL | Status: DC
  Administered 2019-03-31 – 2019-04-01 (×2): 1000 mL

## 2019-03-31 MED ORDER — TICAGRELOR 90 MG PO TABS: 90.0000 mg | ORAL_TABLET | Freq: Two times a day (BID) | ORAL | Status: DC

## 2019-03-31 MED ORDER — SUCCINYLCHOLINE CHLORIDE 20 MG/ML IJ SOLN
INTRAMUSCULAR | Status: AC | PRN
  Administered 2019-03-31: 125 mg via INTRAVENOUS

## 2019-03-31 MED ORDER — SODIUM CHLORIDE 0.9 % IV SOLN
2.0000 g | Freq: Two times a day (BID) | INTRAVENOUS | Status: DC
  Administered 2019-04-01 – 2019-04-05 (×9): 2 g via INTRAVENOUS
  Filled 2019-03-31 (×10): qty 2

## 2019-03-31 MED ORDER — EPINEPHRINE PF 1 MG/ML IJ SOLN
0.5000 ug/min | INTRAVENOUS | Status: DC
  Administered 2019-03-31: 5 ug/min via INTRAVENOUS
  Administered 2019-03-31: 8 ug/min via INTRAVENOUS
  Administered 2019-04-02 – 2019-04-04 (×2): 2 ug/min via INTRAVENOUS
  Filled 2019-03-31 (×5): qty 4

## 2019-03-31 MED ORDER — ASPIRIN 81 MG PO CHEW
81.0000 mg | CHEWABLE_TABLET | Freq: Every day | ORAL | Status: DC
  Administered 2019-04-01 – 2019-04-07 (×7): 81 mg
  Filled 2019-03-31 (×7): qty 1

## 2019-03-31 MED ORDER — SODIUM CHLORIDE 0.9 % IV SOLN
2.0000 g | Freq: Once | INTRAVENOUS | Status: AC
  Administered 2019-03-31: 18:00:00 2 g via INTRAVENOUS
  Filled 2019-03-31: qty 2

## 2019-03-31 MED ORDER — ORAL CARE MOUTH RINSE
15.0000 mL | OROMUCOSAL | Status: DC
  Administered 2019-03-31 – 2019-04-07 (×74): 15 mL via OROMUCOSAL

## 2019-03-31 MED ORDER — SODIUM BICARBONATE 8.4 % IV SOLN
INTRAVENOUS | Status: DC | PRN
  Administered 2019-03-31: 50 meq via INTRAVENOUS

## 2019-03-31 MED ORDER — NOREPINEPHRINE 4 MG/250ML-% IV SOLN
INTRAVENOUS | Status: AC
  Filled 2019-03-31: qty 250

## 2019-03-31 MED ORDER — HEPARIN SODIUM (PORCINE) 5000 UNIT/ML IJ SOLN
5000.0000 [IU] | Freq: Once | INTRAMUSCULAR | Status: AC
  Administered 2019-03-31: 01:00:00 5000 [IU] via INTRAVENOUS

## 2019-03-31 MED ORDER — CANGRELOR TETRASODIUM 50 MG IV SOLR
INTRAVENOUS | Status: AC
  Filled 2019-03-31: qty 50

## 2019-03-31 MED ORDER — FENTANYL 2500MCG IN NS 250ML (10MCG/ML) PREMIX INFUSION
0.0000 ug/h | INTRAVENOUS | Status: AC
  Administered 2019-03-31: 25 ug/h via INTRAVENOUS
  Filled 2019-03-31 (×2): qty 250

## 2019-03-31 MED ORDER — TIROFIBAN HCL IN NACL 5-0.9 MG/100ML-% IV SOLN
0.1500 ug/kg/min | INTRAVENOUS | Status: AC
  Administered 2019-03-31 (×3): 0.15 ug/kg/min via INTRAVENOUS
  Filled 2019-03-31 (×3): qty 100

## 2019-03-31 MED ORDER — SODIUM CHLORIDE 0.9 % IV SOLN: 250.0000 mL | INTRAVENOUS | Status: DC | PRN

## 2019-03-31 MED ORDER — ASPIRIN EC 81 MG PO TBEC
81.0000 mg | DELAYED_RELEASE_TABLET | Freq: Every day | ORAL | Status: DC
  Filled 2019-03-31: qty 1

## 2019-03-31 MED ORDER — ASPIRIN 300 MG RE SUPP
300.0000 mg | Freq: Once | RECTAL | Status: AC
  Administered 2019-03-31: 01:00:00 300 mg via RECTAL
  Filled 2019-03-31: qty 1

## 2019-03-31 MED ORDER — TICAGRELOR 90 MG PO TABS
90.0000 mg | ORAL_TABLET | Freq: Two times a day (BID) | ORAL | Status: DC
  Administered 2019-03-31 – 2019-04-07 (×13): 90 mg
  Filled 2019-03-31 (×13): qty 1

## 2019-03-31 MED ORDER — NITROGLYCERIN 1 MG/10 ML FOR IR/CATH LAB
INTRA_ARTERIAL | Status: AC
  Filled 2019-03-31: qty 10

## 2019-03-31 MED ORDER — HEPARIN (PORCINE) IN NACL 1000-0.9 UT/500ML-% IV SOLN
INTRAVENOUS | Status: AC
  Filled 2019-03-31: qty 1000

## 2019-03-31 MED ORDER — VERAPAMIL HCL 2.5 MG/ML IV SOLN
INTRAVENOUS | Status: DC | PRN
  Administered 2019-03-31: 02:00:00 10 mL via INTRA_ARTERIAL

## 2019-03-31 MED ORDER — CANGRELOR BOLUS VIA INFUSION
INTRAVENOUS | Status: DC | PRN
  Administered 2019-03-31: 2517 ug via INTRAVENOUS

## 2019-03-31 MED ORDER — VERAPAMIL HCL 2.5 MG/ML IV SOLN
INTRAVENOUS | Status: AC
  Filled 2019-03-31: qty 2

## 2019-03-31 MED ORDER — SODIUM CHLORIDE 0.9 % IV SOLN
1.7500 mg/kg/h | INTRAVENOUS | Status: DC
  Filled 2019-03-31: qty 250

## 2019-03-31 MED ORDER — LIDOCAINE HCL (PF) 1 % IJ SOLN
INTRAMUSCULAR | Status: AC
  Filled 2019-03-31: qty 30

## 2019-03-31 MED ORDER — PROPOFOL 1000 MG/100ML IV EMUL
INTRAVENOUS | Status: AC
  Filled 2019-03-31: qty 100

## 2019-03-31 MED ORDER — FENTANYL CITRATE (PF) 100 MCG/2ML IJ SOLN
INTRAMUSCULAR | Status: AC
  Filled 2019-03-31: qty 2

## 2019-03-31 MED ORDER — SODIUM CHLORIDE 0.9 % IV SOLN
INTRAVENOUS | Status: DC
  Administered 2019-03-31: 02:00:00 10 mL/h via INTRAVENOUS

## 2019-03-31 MED ORDER — HEPARIN SODIUM (PORCINE) 5000 UNIT/ML IJ SOLN
50000.0000 [IU] | INTRAVENOUS | Status: DC
  Administered 2019-03-31 – 2019-04-02 (×3): 50000 [IU]
  Filled 2019-03-31 (×4): qty 10

## 2019-03-31 MED ORDER — LABETALOL HCL 5 MG/ML IV SOLN: 10.0000 mg | INTRAVENOUS | Status: AC | PRN

## 2019-03-31 MED ORDER — CHLORHEXIDINE GLUCONATE 0.12% ORAL RINSE (MEDLINE KIT)
15.0000 mL | Freq: Two times a day (BID) | OROMUCOSAL | Status: DC
  Administered 2019-03-31 – 2019-04-07 (×16): 15 mL via OROMUCOSAL

## 2019-03-31 MED ORDER — BIVALIRUDIN TRIFLUOROACETATE 250 MG IV SOLR
INTRAVENOUS | Status: AC
  Filled 2019-03-31: qty 250

## 2019-03-31 MED ORDER — VANCOMYCIN HCL 10 G IV SOLR
1750.0000 mg | Freq: Once | INTRAVENOUS | Status: AC
  Administered 2019-03-31: 19:00:00 1750 mg via INTRAVENOUS
  Filled 2019-03-31: qty 1750

## 2019-03-31 MED ORDER — EPINEPHRINE 1 MG/10ML IJ SOSY
PREFILLED_SYRINGE | INTRAMUSCULAR | Status: DC | PRN
  Administered 2019-03-31: 0.5 mg via INTRAVENOUS
  Administered 2019-03-31: 1 mg via INTRAVENOUS
  Administered 2019-03-31: 0.5 mg via INTRAVENOUS

## 2019-03-31 MED ORDER — TIROFIBAN HCL IN NACL 5-0.9 MG/100ML-% IV SOLN
INTRAVENOUS | Status: AC
  Filled 2019-03-31: qty 100

## 2019-03-31 MED ORDER — MIDAZOLAM 50MG/50ML (1MG/ML) PREMIX INFUSION
1.0000 mg/h | INTRAVENOUS | Status: DC
  Administered 2019-03-31: 6 mg/h via INTRAVENOUS
  Administered 2019-04-01 – 2019-04-02 (×3): 5 mg/h via INTRAVENOUS
  Administered 2019-04-02: 20:00:00 2 mg/h via INTRAVENOUS
  Administered 2019-04-03: 3 mg/h via INTRAVENOUS
  Filled 2019-03-31 (×6): qty 50

## 2019-03-31 MED ORDER — MIDAZOLAM 50MG/50ML (1MG/ML) PREMIX INFUSION
0.0000 mg/h | INTRAVENOUS | Status: AC
  Administered 2019-03-31: 10:00:00 4 mg/h via INTRAVENOUS
  Filled 2019-03-31: qty 50

## 2019-03-31 MED ORDER — BIVALIRUDIN BOLUS VIA INFUSION - CUPID
INTRAVENOUS | Status: DC | PRN
  Administered 2019-03-31: 62.925 mg via INTRAVENOUS

## 2019-03-31 MED ORDER — MIDAZOLAM HCL 2 MG/2ML IJ SOLN
INTRAMUSCULAR | Status: AC
  Filled 2019-03-31: qty 2

## 2019-03-31 MED ORDER — LIDOCAINE HCL (PF) 1 % IJ SOLN
INTRAMUSCULAR | Status: DC | PRN
  Administered 2019-03-31: 2 mL

## 2019-03-31 MED ORDER — TICAGRELOR 90 MG PO TABS
180.0000 mg | ORAL_TABLET | Freq: Two times a day (BID) | ORAL | Status: AC
  Administered 2019-03-31: 07:00:00 180 mg via ORAL
  Filled 2019-03-31: qty 2

## 2019-03-31 MED ORDER — SODIUM CHLORIDE 0.9% FLUSH
3.0000 mL | Freq: Two times a day (BID) | INTRAVENOUS | Status: DC
  Administered 2019-03-31 – 2019-04-05 (×11): 3 mL via INTRAVENOUS

## 2019-03-31 MED ORDER — MIDAZOLAM HCL 2 MG/2ML IJ SOLN
INTRAMUSCULAR | Status: DC | PRN
  Administered 2019-03-31 (×3): 2 mg via INTRAVENOUS

## 2019-03-31 MED ORDER — ONDANSETRON HCL 4 MG/2ML IJ SOLN
4.0000 mg | Freq: Four times a day (QID) | INTRAMUSCULAR | Status: DC | PRN
  Filled 2019-03-31: qty 2

## 2019-03-31 MED ORDER — EPINEPHRINE 1 MG/10ML IJ SOSY
PREFILLED_SYRINGE | INTRAMUSCULAR | Status: AC
  Filled 2019-03-31: qty 10

## 2019-03-31 MED ORDER — MIDAZOLAM 50MG/50ML (1MG/ML) PREMIX INFUSION
0.5000 mg/h | INTRAVENOUS | Status: AC
  Administered 2019-03-31: 0.5 mg/h via INTRAVENOUS
  Filled 2019-03-31: qty 50

## 2019-03-31 MED ORDER — ACETAMINOPHEN 325 MG PO TABS: 650.0000 mg | ORAL_TABLET | ORAL | Status: DC | PRN

## 2019-03-31 MED ORDER — TIROFIBAN (AGGRASTAT) BOLUS VIA INFUSION
INTRAVENOUS | Status: DC | PRN
  Administered 2019-03-31: 2097.5 ug via INTRAVENOUS

## 2019-03-31 MED ORDER — FENTANYL 2500MCG IN NS 250ML (10MCG/ML) PREMIX INFUSION
0.0000 ug/h | INTRAVENOUS | Status: DC
  Administered 2019-03-31 – 2019-04-02 (×5): 300 ug/h via INTRAVENOUS
  Administered 2019-04-03 (×3): 250 ug/h via INTRAVENOUS
  Administered 2019-04-05: 175 ug/h via INTRAVENOUS
  Filled 2019-03-31 (×11): qty 250

## 2019-03-31 MED ORDER — SODIUM CHLORIDE 0.9 % IV SOLN
INTRAVENOUS | Status: AC | PRN
  Administered 2019-03-31 (×3): 1.75 mg/kg/h via INTRAVENOUS

## 2019-03-31 MED ORDER — IOHEXOL 350 MG/ML SOLN
INTRAVENOUS | Status: DC | PRN
  Administered 2019-03-31: 05:00:00 200 mL via INTRA_ARTERIAL

## 2019-03-31 MED ORDER — SODIUM CHLORIDE 0.9 % IV SOLN
INTRAVENOUS | Status: DC | PRN
  Administered 2019-03-31: 14:00:00 500 mL via INTRAVENOUS

## 2019-03-31 MED ORDER — NITROGLYCERIN 0.4 MG SL SUBL: 0.4000 mg | SUBLINGUAL_TABLET | SUBLINGUAL | Status: DC | PRN

## 2019-03-31 MED ORDER — SODIUM CHLORIDE 0.9% FLUSH: 10.0000 mL | INTRAVENOUS | Status: DC | PRN

## 2019-03-31 MED ORDER — HEPARIN (PORCINE) 25000 UT/250ML-% IV SOLN
200.0000 [IU]/h | INTRAVENOUS | Status: DC
  Administered 2019-03-31: 14:00:00 200 [IU]/h via INTRAVENOUS
  Administered 2019-04-02 – 2019-04-03 (×2): 800 [IU]/h via INTRAVENOUS
  Filled 2019-03-31 (×3): qty 250

## 2019-03-31 MED ORDER — POTASSIUM CHLORIDE 20 MEQ/15ML (10%) PO SOLN
40.0000 meq | Freq: Once | ORAL | Status: AC
  Administered 2019-03-31: 08:00:00 40 meq
  Filled 2019-03-31: qty 30

## 2019-03-31 MED ORDER — SODIUM CHLORIDE 0.9% FLUSH
10.0000 mL | Freq: Two times a day (BID) | INTRAVENOUS | Status: DC
  Administered 2019-03-31 – 2019-04-09 (×15): 10 mL
  Administered 2019-04-09: 30 mL
  Administered 2019-04-10 – 2019-04-16 (×12): 10 mL

## 2019-03-31 MED ORDER — FENTANYL CITRATE (PF) 100 MCG/2ML IJ SOLN
INTRAMUSCULAR | Status: DC | PRN
  Administered 2019-03-31: 50 ug via INTRAVENOUS

## 2019-03-31 MED ORDER — ATORVASTATIN CALCIUM 80 MG PO TABS
80.0000 mg | ORAL_TABLET | Freq: Every day | ORAL | Status: DC
  Administered 2019-03-31 – 2019-04-07 (×7): 80 mg
  Filled 2019-03-31 (×7): qty 1

## 2019-03-31 MED ORDER — MAGNESIUM SULFATE 2 GM/50ML IV SOLN: 2.0000 g | Freq: Once | INTRAVENOUS | Status: AC

## 2019-03-31 MED ORDER — PROPOFOL 1000 MG/100ML IV EMUL
5.0000 ug/kg/min | INTRAVENOUS | Status: DC
  Administered 2019-03-31: 50 ug/kg/min via INTRAVENOUS
  Administered 2019-03-31: 60 ug/kg/min via INTRAVENOUS
  Administered 2019-03-31: 5 ug/kg/min via INTRAVENOUS
  Filled 2019-03-31: qty 100

## 2019-03-31 MED ORDER — ONDANSETRON HCL 4 MG/2ML IJ SOLN: 4.0000 mg | Freq: Four times a day (QID) | INTRAMUSCULAR | Status: DC | PRN

## 2019-03-31 MED ORDER — MAGNESIUM SULFATE 2 GM/50ML IV SOLN
2.0000 g | Freq: Once | INTRAVENOUS | Status: AC
  Administered 2019-03-31: 17:00:00 2 g via INTRAVENOUS
  Filled 2019-03-31: qty 50

## 2019-03-31 MED ORDER — SODIUM CHLORIDE 0.9% FLUSH: 3.0000 mL | INTRAVENOUS | Status: DC | PRN

## 2019-03-31 MED ORDER — SODIUM CHLORIDE 0.9 % IV SOLN
INTRAVENOUS | Status: AC | PRN
  Administered 2019-03-31 (×2): 4 ug/kg/min via INTRAVENOUS

## 2019-03-31 MED ORDER — INSULIN ASPART 100 UNIT/ML ~~LOC~~ SOLN
0.0000 [IU] | SUBCUTANEOUS | Status: DC
  Administered 2019-03-31: 20:00:00 1 [IU] via SUBCUTANEOUS
  Administered 2019-03-31: 08:00:00 3 [IU] via SUBCUTANEOUS
  Administered 2019-03-31: 11:00:00 2 [IU] via SUBCUTANEOUS
  Administered 2019-04-01 (×2): 1 [IU] via SUBCUTANEOUS
  Administered 2019-04-01: 11:00:00 2 [IU] via SUBCUTANEOUS
  Administered 2019-04-01: 20:00:00 1 [IU] via SUBCUTANEOUS
  Administered 2019-04-01: 2 [IU] via SUBCUTANEOUS
  Administered 2019-04-01 (×2): 1 [IU] via SUBCUTANEOUS
  Administered 2019-04-02: 2 [IU] via SUBCUTANEOUS
  Administered 2019-04-02: 1 [IU] via SUBCUTANEOUS
  Administered 2019-04-02: 05:00:00 2 [IU] via SUBCUTANEOUS
  Administered 2019-04-02 – 2019-04-03 (×4): 1 [IU] via SUBCUTANEOUS
  Administered 2019-04-03 (×3): 2 [IU] via SUBCUTANEOUS
  Administered 2019-04-04: 20:00:00 1 [IU] via SUBCUTANEOUS
  Administered 2019-04-04 (×2): 2 [IU] via SUBCUTANEOUS
  Administered 2019-04-04: 1 [IU] via SUBCUTANEOUS
  Administered 2019-04-05: 2 [IU] via SUBCUTANEOUS
  Administered 2019-04-05 – 2019-04-15 (×19): 1 [IU] via SUBCUTANEOUS

## 2019-03-31 MED ORDER — ATORVASTATIN CALCIUM 80 MG PO TABS: 80.0000 mg | ORAL_TABLET | Freq: Every day | ORAL | Status: DC

## 2019-03-31 MED ORDER — TIROFIBAN (AGGRASTAT) BOLUS VIA INFUSION
25.0000 ug/kg | Freq: Once | INTRAVENOUS | Status: DC
  Filled 2019-03-31: qty 42

## 2019-03-31 MED ORDER — HEPARIN (PORCINE) IN NACL 1000-0.9 UT/500ML-% IV SOLN
INTRAVENOUS | Status: DC | PRN
  Administered 2019-03-31 (×2): 500 mL

## 2019-03-31 MED ORDER — ACETAMINOPHEN 325 MG PO TABS
650.0000 mg | ORAL_TABLET | ORAL | Status: DC | PRN
  Filled 2019-03-31: qty 2

## 2019-03-31 MED ORDER — PANTOPRAZOLE SODIUM 40 MG PO PACK
40.0000 mg | PACK | Freq: Every day | ORAL | Status: DC
  Administered 2019-03-31 – 2019-04-07 (×8): 40 mg
  Filled 2019-03-31 (×8): qty 20

## 2019-03-31 MED ORDER — FENTANYL 2500MCG IN NS 250ML (10MCG/ML) PREMIX INFUSION
0.0000 ug/h | INTRAVENOUS | Status: AC
  Administered 2019-03-31: 13:00:00 300 ug/h via INTRAVENOUS
  Filled 2019-03-31: qty 250

## 2019-03-31 MED ORDER — CHLORHEXIDINE GLUCONATE CLOTH 2 % EX PADS
6.0000 | MEDICATED_PAD | Freq: Every day | CUTANEOUS | Status: DC
  Administered 2019-03-31 – 2019-04-14 (×11): 6 via TOPICAL

## 2019-03-31 MED ORDER — VANCOMYCIN HCL 10 G IV SOLR
1500.0000 mg | INTRAVENOUS | Status: DC
  Filled 2019-03-31: qty 1500

## 2019-03-31 MED ORDER — NOREPINEPHRINE BITARTRATE 1 MG/ML IV SOLN
INTRAVENOUS | Status: AC | PRN
  Administered 2019-03-31: 4 ug/min via INTRAVENOUS

## 2019-03-31 MED ORDER — POTASSIUM CHLORIDE CRYS ER 20 MEQ PO TBCR: 40.0000 meq | EXTENDED_RELEASE_TABLET | Freq: Once | ORAL | Status: DC

## 2019-03-31 MED ORDER — NOREPINEPHRINE 16 MG/250ML-% IV SOLN
2.0000 ug/min | INTRAVENOUS | Status: DC
  Administered 2019-03-31: 14:00:00 28 ug/min via INTRAVENOUS
  Administered 2019-03-31: 07:00:00 40 ug/min via INTRAVENOUS
  Administered 2019-04-01: 23 ug/min via INTRAVENOUS
  Administered 2019-04-01: 15 ug/min via INTRAVENOUS
  Administered 2019-04-02: 10 ug/min via INTRAVENOUS
  Administered 2019-04-05: 5 ug/min via INTRAVENOUS
  Filled 2019-03-31 (×6): qty 250

## 2019-03-31 MED ORDER — MAGNESIUM SULFATE 2 GM/50ML IV SOLN
INTRAVENOUS | Status: AC
  Administered 2019-03-31: 07:00:00 2 g
  Filled 2019-03-31: qty 50

## 2019-03-31 MED ORDER — EPINEPHRINE PF 1 MG/ML IJ SOLN
0.5000 ug/min | INTRAVENOUS | Status: AC
  Administered 2019-03-31: 05:00:00 5 ug/min via INTRAVENOUS
  Filled 2019-03-31: qty 4

## 2019-03-31 MED ORDER — HYDRALAZINE HCL 20 MG/ML IJ SOLN: 10.0000 mg | INTRAMUSCULAR | Status: AC | PRN

## 2019-03-31 MED ORDER — TIROFIBAN HCL IN NACL 5-0.9 MG/100ML-% IV SOLN
INTRAVENOUS | Status: AC | PRN
  Administered 2019-03-31: 0.075 ug/kg/min via INTRAVENOUS

## 2019-03-31 MED ORDER — ASPIRIN 81 MG PO CHEW: 81.0000 mg | CHEWABLE_TABLET | Freq: Every day | ORAL | Status: DC

## 2019-03-31 MED ORDER — NOREPINEPHRINE 4 MG/250ML-% IV SOLN
2.0000 ug/min | INTRAVENOUS | Status: DC
  Administered 2019-03-31: 06:00:00 30 ug/min via INTRAVENOUS
  Filled 2019-03-31: qty 250

## 2019-03-31 MED ORDER — FENTANYL CITRATE (PF) 100 MCG/2ML IJ SOLN
INTRAMUSCULAR | Status: AC | PRN
  Administered 2019-03-31: 100 ug via INTRAVENOUS

## 2019-03-31 MED ORDER — INSULIN ASPART 100 UNIT/ML ~~LOC~~ SOLN
3.0000 [IU] | SUBCUTANEOUS | Status: DC
  Administered 2019-03-31 – 2019-04-12 (×32): 3 [IU] via SUBCUTANEOUS

## 2019-03-31 MED ORDER — ACETAMINOPHEN 325 MG PO TABS
650.0000 mg | ORAL_TABLET | ORAL | Status: DC | PRN
  Administered 2019-03-31 – 2019-04-14 (×10): 650 mg
  Filled 2019-03-31 (×10): qty 2

## 2019-03-31 MED ORDER — ONDANSETRON HCL 4 MG/2ML IJ SOLN
4.0000 mg | Freq: Once | INTRAMUSCULAR | Status: AC
  Administered 2019-03-31: 01:00:00 4 mg via INTRAVENOUS
  Filled 2019-03-31: qty 2

## 2019-03-31 MED ORDER — SODIUM CHLORIDE 0.9 % IV SOLN
1.7500 mg/kg/h | INTRAVENOUS | Status: AC
  Administered 2019-03-31: 06:00:00 1.75 mg/kg/h via INTRAVENOUS
  Filled 2019-03-31 (×2): qty 250

## 2019-03-31 MED ORDER — ETOMIDATE 2 MG/ML IV SOLN
INTRAVENOUS | Status: AC | PRN
  Administered 2019-03-31: 20 mg via INTRAVENOUS

## 2019-03-31 SURGICAL SUPPLY — 33 items
BALLN SAPPHIRE 2.0X12 (BALLOONS) ×2
BALLN SAPPHIRE 2.5X20 (BALLOONS) ×2
BALLN SAPPHIRE ~~LOC~~ 2.75X15 (BALLOONS) ×1 IMPLANT
BALLOON SAPPHIRE 2.0X12 (BALLOONS) IMPLANT
BALLOON SAPPHIRE 2.5X20 (BALLOONS) IMPLANT
CATH INFINITI 5FR ANG PIGTAIL (CATHETERS) ×1 IMPLANT
CATH OPTITORQUE TIG 4.0 5F (CATHETERS) ×1 IMPLANT
CATH SWAN GANZ 7F STRAIGHT (CATHETERS) ×1 IMPLANT
CATH VISTA GUIDE 6FR XBLAD3.5 (CATHETERS) ×1 IMPLANT
DEVICE RAD COMP TR BAND LRG (VASCULAR PRODUCTS) ×1 IMPLANT
FEM STOP ARCH (HEMOSTASIS) ×1
GLIDESHEATH SLEND SS 6F .021 (SHEATH) ×1 IMPLANT
GUIDEWIRE INQWIRE 1.5J.035X260 (WIRE) IMPLANT
HOVERMATT SINGLE USE (MISCELLANEOUS) ×1 IMPLANT
INQWIRE 1.5J .035X260CM (WIRE) ×2
KIT ENCORE 26 ADVANTAGE (KITS) ×1 IMPLANT
KIT HEART LEFT (KITS) ×2 IMPLANT
PACK CARDIAC CATHETERIZATION (CUSTOM PROCEDURE TRAY) ×2 IMPLANT
SET IMPELLA CP PUMP (CATHETERS) ×1 IMPLANT
SHEATH PINNACLE 6F 10CM (SHEATH) ×1 IMPLANT
SHEATH PINNACLE 7F 10CM (SHEATH) ×1 IMPLANT
SHEATH PROBE COVER 6X72 (BAG) ×1 IMPLANT
SLEEVE REPOSITIONING LENGTH 30 (MISCELLANEOUS) ×1 IMPLANT
STENT RESOLUTE ONYX 2.75X26 (Permanent Stent) ×1 IMPLANT
SYSTEM COMPRESSION FEMOSTOP (HEMOSTASIS) IMPLANT
TRANSDUCER W/STOPCOCK (MISCELLANEOUS) ×3 IMPLANT
TUBING ART PRESS 72  MALE/FEM (TUBING) ×1
TUBING ART PRESS 72 MALE/FEM (TUBING) IMPLANT
TUBING CIL FLEX 10 FLL-RA (TUBING) ×2 IMPLANT
WIRE EMERALD 3MM-J .025X260CM (WIRE) ×1 IMPLANT
WIRE EMERALD 3MM-J .035X150CM (WIRE) ×1 IMPLANT
WIRE HI TORQ BMW 190CM (WIRE) ×1 IMPLANT
WIRE PT2 MS 185 (WIRE) ×1 IMPLANT

## 2019-03-31 NOTE — H&P (Signed)
Cardiology History & Physical    Patient ID: Stephen Jennings MRN: [id number]; DOB: [birth year]   Admission date: 03/31/2019  Primary Care Provider: No primary care provider on file. Primary Cardiologist: No primary care provider on file.  Primary Electrophysiologist:  None   Chief Complaint:  Respiratory distress  Patient Profile:   Stephen Jennings is a 63 y.o. male with no known PMH who presents with respiratory distress.  History of Present Illness:   History is limited due to the patient being intubated.  The patient arrived by EMS in severe respiratory distress and was intubated in the ED.  12 lead ECG after intubation showed possible STEMI (ST elevation in V1-V2, aVL, ST depression inferiorly).  CXR showed pulmonary edema.  Patient was brought to the cath lab.  Angiography demonstrated an occluded proximal LAD which was successfully stented.  During the procedure the patient developed severe hypotension despite norepinephrine infusion.  RHC was performed which demonstrated elevated filling pressures and a Cardiac Index of 1.1.  An Impella CP device was placed via the RFA.  The patient required several pushes of epinephrine to maintain an adequate blood pressure.  Eventually he was [facility] to [facility] in stable but critical condition.  Heart Pathway Score:      Past Medical History:  Diagnosis Date  . Medical history non-contributory     Past Surgical History:  Procedure Laterality Date  . NO PAST SURGERIES       Medications Prior to Admission: Prior to Admission medications   Medication Sig Start Date End Date Taking? Authorizing Provider  erythromycin ophthalmic ointment Place into the right eye 3 (three) times daily. 01/09/14   Junius Creamer, NP  ibuprofen (ADVIL,MOTRIN) 200 MG tablet Take 400 mg by mouth every 6 (six) hours as needed (for pain.).    [provider]     Allergies:   No Known Allergies  Social History:   Social History   Socioeconomic History  .  Marital status: Single    Spouse name: Not on file  . Number of children: Not on file  . Years of education: Not on file  . Highest education level: Not on file  Occupational History  . Not on file  Social Needs  . Financial resource strain: Not on file  . Food insecurity    Worry: Not on file    Inability: Not on file  . Transportation needs    Medical: Not on file    Non-medical: Not on file  Tobacco Use  . Smoking status: Current Every Day Smoker  Substance and Sexual Activity  . Alcohol use: No  . Drug use: No  . Sexual activity: Not on file  Lifestyle  . Physical activity    Days per week: Not on file    Minutes per session: Not on file  . Stress: Not on file  Relationships  . Social Herbalist on phone: Not on file    Gets together: Not on file    Attends religious service: Not on file    Active member of club or organization: Not on file    Attends meetings of clubs or organizations: Not on file    Relationship status: Not on file  . Intimate partner violence    Fear of current or ex partner: Not on file    Emotionally abused: Not on file    Physically abused: Not on file    Forced sexual activity: Not on file  Other Topics  Concern  . Not on file  Social History Narrative  . Not on file     Family History:  Noncontributory  ROS:  Unable to perform due to patient being intubated.  Physical Exam/Data:   Vitals:   03/31/19 0452 03/31/19 0457 03/31/19 0502 03/31/19 0507  BP:      Pulse:      Resp: (!) 0 (!) 0 (!) 0 (!) 0  SpO2:      Weight:      Height:       No intake or output data in the 24 hours ending 03/31/19 0539 Last 3 Weights 03/31/2019 01/08/2014  Weight (lbs) 185 lb 192 lb  Weight (kg) 83.915 kg 87.091 kg     Body mass index is 27.31 kg/m.  Wt Readings from Last 3 Encounters:  03/31/19 83.9 kg  01/08/14 87.1 kg    Physical Exam: General: intubated, sedated Head: Normocephalic, atraumatic, sclera non-icteric, no  xanthomas, nares are without discharge.  Neck: Negative for carotid bruits. JVD not visible Lungs: Symmetric breath sounds, diffuse rhonchi Heart: RRR with S1 S2. No murmurs, rubs, or gallops appreciated. Abdomen: Soft, non-tender, non-distended with normoactive bowel sounds.  Msk:  Strength and tone appear normal for age. Extremities: No clubbing or cyanosis. No edema.  Neuro: Sedated Psych:  Sedated    EKG:  The ECG that was done demonstrated NSR with acute STEMI in V1-V2  Relevant CV Studies: none  Laboratory Data:  High Sensitivity Troponin:   Recent Labs  Lab 03/31/19 0013  TROPONINIHS 465*      Cardiac EnzymesNo results for input(s): TROPONINI in the last 168 hours. No results for input(s): TROPIPOC in the last 168 hours.  Chemistry Recent Labs  Lab 03/31/19 0013  NA 139  K 3.9  CL 102  CO2 24  GLUCOSE 340*  BUN 16  CREATININE 1.72*  CALCIUM 8.8*  GFRNONAA 42*  GFRAA 48*  ANIONGAP 13    Recent Labs  Lab 03/31/19 0013  PROT 7.8  ALBUMIN 3.9  AST 45*  ALT 28  ALKPHOS 109  BILITOT 1.0   Hematology Recent Labs  Lab 03/31/19 0013  WBC 15.0*  RBC 5.45  HGB 17.0  HCT 52.6*  MCV 96.5  MCH 31.2  MCHC 32.3  RDW 14.0  PLT 388   BNP Recent Labs  Lab 03/31/19 0013  BNP 1,247.4*    DDimer No results for input(s): DDIMER in the last 168 hours.   Radiology/Studies:  Dg Chest Port 1 View  Result Date: 03/31/2019 CLINICAL DATA:  Shortness of breath EXAM: PORTABLE CHEST 1 VIEW COMPARISON:  None. FINDINGS: Cardiac shadows within normal limits. The lungs are well aerated bilaterally. Endotracheal tube is noted just below the thoracic inlet. The nasogastric catheter is noted with the tip in the stomach. Proximal side port lies in the distal esophagus. This could be advanced several cm into the stomach. Vascular congestion is noted as well as some patchy infiltrate particularly in the right mid and lower lung. IMPRESSION: Changes consistent with vascular  congestion and diffuse edema worse on the right than the left. Electronically Signed   By: Inez Catalina M.D.   On: 03/31/2019 00:31    Assessment and Plan   1. Cardiogenic shock Filling pressures and cardiac index consistent with cardiogenic shock. Patient status remains tenuous.  Continue support with Impella and pressors.  Advanced HF is following. -- impella in place -- echo pending -- will need IV diuresis once stable  2. Respiratory failure  Likely due to pulmonary edema in setting of cardiogenic shock.  Will monitor for infection.  WBC 15k.  Low threshold to start antibiotics given shock.  Will consult pulm/CC for ventilator management.  3. STEMI Successful PCI to occluded proximal LAD.   -- asa 81 -- ticagrelor -- statin -- BB once stable  4. Acute vs chronic renal failure Cr 1.7 on admission, no recent baseline.  Pt is volume up.  Will monitor Cr with diuresis.  5. Undiagnosed diabetes mellitus BG 340 on admission with no history of DM.  Will send A1C and start SQ insulin q4h while NPO.   Severity of Illness: The appropriate patient status for this patient is INPATIENT. Inpatient status is judged to be reasonable and necessary in order to provide the required intensity of service to ensure the patient's safety. The patient's presenting symptoms, physical exam findings, and initial radiographic and laboratory data in the context of their chronic comorbidities is felt to place them at high risk for further clinical deterioration. Furthermore, it is not anticipated that the patient will be medically stable for discharge from the hospital within 2 midnights of admission. The following factors support the patient status of inpatient.   " The patient's presenting symptoms include dyspnea. " The worrisome physical exam findings include pulmonary edema. " The initial radiographic and laboratory data are worrisome because of acute MI. " The chronic co-morbidities include diabetes.    * I certify that at the point of admission it is my clinical judgment that the patient will require inpatient hospital care spanning beyond 2 midnights from the point of admission due to high intensity of service, high risk for further deterioration and high frequency of surveillance required.*    For questions or updates, please contact [place] Please consult [email or web address] for contact info under      Signed, BARNETT, ADAM S, MD 03/31/2019, 5:39 AM

## 2019-03-31 NOTE — Progress Notes (Signed)
Orthopedic Tech Progress Note Patient Details:  Stephen Jennings February 25, 1956 [id number]  Ortho Devices Type of Ortho Device: Knee Immobilizer Ortho Device/Splint Location: delivered to room. Ortho Device/Splint Interventions: Renard Matter 03/31/2019, 5:42 AM

## 2019-03-31 NOTE — Consult Note (Signed)
Advanced Heart Failure Team Consult Note   Primary Physician: No primary care provider on file. PCP-Cardiologist:  No primary care provider on file.  Reason for Consultation: Cardiogenic shock  HPI:    Stephen Jennings is seen today for evaluation of cardiogenic shock  at the request of Dr. Claiborne Billings   63 y/o male with h/o COPD. No known cardiac history. Brought to ER for respiratory failure/arrest. Intubated in ER. Initial ECG with anterior ST elevation. Intubated and transported to cath lab emergently  Cath by Dr. Claiborne Billings with 3v CAD and totally occluded prox LAD. LCX anomalous off RCA. Underwent PCI of LAD which was diffusely diseased vessel.   Developed cardiogenic shock started on NE 20 and Impella placed. I was called to help assist with management.   Developed several episodes of severe hypotension and was given epi pushes. Epi dripped started. I placed left radial arterial line and LIJ TLC. I personally assisted in transport to ICU   Review of Systems: Not available as patient intubated/sedated  Home Medications Prior to Admission medications   Medication Sig Start Date End Date Taking? Authorizing Provider  erythromycin ophthalmic ointment Place into the right eye 3 (three) times daily. 01/09/14   Junius Creamer, NP  ibuprofen (ADVIL,MOTRIN) 200 MG tablet Take 400 mg by mouth every 6 (six) hours as needed (for pain.).    [provider]    Past Medical History: Past Medical History:  Diagnosis Date  . Medical history non-contributory     Past Surgical History: Past Surgical History:  Procedure Laterality Date  . NO PAST SURGERIES      Family History: Not available. Patient intubated/sedated   Social History: Social History   Socioeconomic History  . Marital status: Single    Spouse name: Not on file  . Number of children: Not on file  . Years of education: Not on file  . Highest education level: Not on file  Occupational History  . Not on file   Social Needs  . Financial resource strain: Not on file  . Food insecurity    Worry: Not on file    Inability: Not on file  . Transportation needs    Medical: Not on file    Non-medical: Not on file  Tobacco Use  . Smoking status: Current Every Day Smoker  Substance and Sexual Activity  . Alcohol use: No  . Drug use: No  . Sexual activity: Not on file  Lifestyle  . Physical activity    Days per week: Not on file    Minutes per session: Not on file  . Stress: Not on file  Relationships  . Social Herbalist on phone: Not on file    Gets together: Not on file    Attends religious service: Not on file    Active member of club or organization: Not on file    Attends meetings of clubs or organizations: Not on file    Relationship status: Not on file  Other Topics Concern  . Not on file  Social History Narrative  . Not on file    Allergies:  No Known Allergies  Objective:    Vital Signs:   Pulse Rate:  [0-90] 72 (08/09 0442) Resp:  [0-70] 0 (08/09 0507) BP: (58-189)/(40-146) 133/99 (08/09 0442) SpO2:  [0 %-100 %] 99 % (08/09 0433) FiO2 (%):  [100 %] 100 % (08/09 0458) Weight:  [83.9 kg] 83.9 kg (08/09 0008)    Weight change: Danley Danker  Weights   03/31/19 0008  Weight: 83.9 kg    Intake/Output:  No intake or output data in the 24 hours ending 03/31/19 0534    Physical Exam    General: Intubated sedated  HEENT: normal Neck: supple. JVP up LIJ TLC . Carotids 2+ bilat; no bruits. No lymphadenopathy or thyromegaly appreciated. Cor: PMI nondisplaced. Regular rate & rhythm. + impella hum Lungs: coarse Abdomen: obese soft, nontender, nondistended. No hepatosplenomegaly. No bruits or masses. Good bowel sounds. Extremities: no cyanosis, clubbing, rash, edema  RFA impella RFV swan Neuro:intubated sedated   Telemetry   NSR 80-90s ,dbpr   EKG    Sinus tach 107 Anterior ST elevation   Labs   Basic Metabolic Panel: Recent Labs  Lab 03/31/19 0013  NA  139  K 3.9  CL 102  CO2 24  GLUCOSE 340*  BUN 16  CREATININE 1.72*  CALCIUM 8.8*    Liver Function Tests: Recent Labs  Lab 03/31/19 0013  AST 45*  ALT 28  ALKPHOS 109  BILITOT 1.0  PROT 7.8  ALBUMIN 3.9   No results for input(s): LIPASE, AMYLASE in the last 168 hours. No results for input(s): AMMONIA in the last 168 hours.  CBC: Recent Labs  Lab 03/31/19 0013  WBC 15.0*  NEUTROABS 6.6  HGB 17.0  HCT 52.6*  MCV 96.5  PLT 388    Cardiac Enzymes: No results for input(s): CKTOTAL, CKMB, CKMBINDEX, TROPONINI in the last 168 hours.  BNP: BNP (last 3 results) Recent Labs    03/31/19 0013  BNP 1,247.4*    ProBNP (last 3 results) No results for input(s): PROBNP in the last 8760 hours.   CBG: No results for input(s): GLUCAP in the last 168 hours.  Coagulation Studies: No results for input(s): LABPROT, INR in the last 72 hours.   Imaging   Dg Chest Port 1 View  Result Date: 03/31/2019 CLINICAL DATA:  Shortness of breath EXAM: PORTABLE CHEST 1 VIEW COMPARISON:  None. FINDINGS: Cardiac shadows within normal limits. The lungs are well aerated bilaterally. Endotracheal tube is noted just below the thoracic inlet. The nasogastric catheter is noted with the tip in the stomach. Proximal side port lies in the distal esophagus. This could be advanced several cm into the stomach. Vascular congestion is noted as well as some patchy infiltrate particularly in the right mid and lower lung. IMPRESSION: Changes consistent with vascular congestion and diffuse edema worse on the right than the left. Electronically Signed   By: Inez Catalina M.D.   On: 03/31/2019 00:31      Medications:     Current Medications: . [START ON 04/01/2019] aspirin EC  81 mg Oral Daily  . atorvastatin  80 mg Oral q1800  . chlorhexidine gluconate (MEDLINE KIT)  15 mL Mouth Rinse BID  . Chlorhexidine Gluconate Cloth  6 each Topical Daily  . fentaNYL      . insulin aspart  0-9 Units Subcutaneous  Q4H  . insulin aspart  3 Units Subcutaneous Q4H  . mouth rinse  15 mL Mouth Rinse 10 times per day  . sodium chloride flush  10-40 mL Intracatheter Q12H  . sodium chloride flush  3 mL Intravenous Q12H  . ticagrelor  180 mg Oral BID  . ticagrelor  90 mg Oral BID     Infusions: . sodium chloride 10 mL/hr (03/31/19 0201)  . sodium chloride    . epinephrine    . fentaNYL infusion INTRAVENOUS    . midazolam    .  norepinephrine (LEVOPHED) Adult infusion    . propofol (DIPRIVAN) infusion 60 mcg/kg/min (03/31/19 0345)      Assessment/Plan   1. Cardiogenic shock due to acute anterior MI - s/p PCI/DES to LAD on 8/9 - Now with Impella support - Continue NE/EPI support. Add milrinone as needed - Swan in follow hemodynamics  2. CAD with acute anterior MI 8/9 - s/p PCI/DES to LAD. 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 Acute hypoxic respiratory failure - in setting of #1 - Intubated. -Cehck ABG -Vent protocol. Consult CCM this am  4. DM2 - new diagnosis - cover with SSI  5. COPD - ongoing tobacco use - needs cessation    Prognosis very tenuous. Family updated.   Total CCT 120 minutes not including procedures.   Length of Stay: 0  [name], MD  03/31/2019, 5:34 AM  Advanced Heart Failure Team Pager [phone] (M-F; 7a - 4p)  Please contact [facility] for night-coverage after hours (4p -7a ) and weekends on [email or web address]

## 2019-03-31 NOTE — Progress Notes (Signed)
MAP 55 with SBP 54; no pulsatility showing in impella. Dr Haroldine Laws called in the room and started CPR. 1 amp epi given per MD orders. Will continue to monitor pt closely

## 2019-03-31 NOTE — Progress Notes (Addendum)
  Patient lost his pulse on arterial line and impella.   I began CPR personally. 1 amp epi given with immediate return of pulse. I turned epi drip to 12.   Swan numbers done personally  Ao 102/66 (77) RA 14 PA 44/21 (32) PCWP 17 CO/CI 5.6/2.8 MV sat 86%  CPO = 0.96 PAPi = 1.64  Impella P-9 with 3.6L flow  Lactic acid down 5.8 -> 3.0  Continue supportive care.   Additional 71min CCT  [name], MD  6:59 AM

## 2019-03-31 NOTE — ED Notes (Signed)
Called to activate code STEMI

## 2019-03-31 NOTE — CV Procedure (Cosign Needed)
Radial arterial line placement.    Emergent consent assumed. The left wrist was prepped and draped in the routine sterile fashion a single lumen radial arterial catheter was placed in the left radial artery using a modified Seldinger technique. Good blood flow and wave forms. A dressing was placed.     [name], MD  5:57 AM

## 2019-03-31 NOTE — Progress Notes (Addendum)
Brief Nutrition Note RD working remotely.  Consult received for enteral/tube feeding initiation and management.  Adult Enteral Nutrition Protocol initiated. Full assessment to follow.  Per chest x-ray this AM pt has enteric tube that terminates in stomach (pending documentation of tube in chart so unsure if it is NGT or OGT)  Admitting Dx: Acute pulmonary edema ([place]) [J81.0] ST elevation myocardial infarction (STEMI), unspecified artery ([place]) [I21.3] Acute hypoxemic respiratory failure ([place]) [J96.01]  Body mass index is 27.31 kg/m. Pt meets criteria for overweight based on current BMI.  Labs:  Recent Labs  Lab 03/31/19 0013  03/31/19 0304 03/31/19 0339 03/31/19 0503  NA 139   < > 144 145 142  K 3.9   < > 4.8 4.7 3.7  CL 102  --   --   --  113*  CO2 24  --   --   --  20*  BUN 16  --   --   --  17  CREATININE 1.72*  --   --   --  1.19  CALCIUM 8.8*  --   --   --  6.6*  MG  --   --   --   --  1.8  GLUCOSE 340*  --   --   --  244*   < > = values in this interval not displayed.   Willey Blade, MS, [place], LDN Office: [phone] Pager: [phone] After Hours/Weekend Pager: [phone]

## 2019-03-31 NOTE — Progress Notes (Signed)
CRITICAL VALUE ALERT  Critical Value:  pTT >176, troponin 5839  Date & Time Notied:  03/31/2019 0645  Provider Notified: Dr Haroldine Laws  Orders Received/Actions taken: none

## 2019-03-31 NOTE — Progress Notes (Signed)
ANTICOAGULATION CONSULT NOTE - Initial Consult  Pharmacy Consult for heparin Indication: Impella  No Known Allergies  Patient Measurements: Height: 5' 9.02" (175.3 cm) Weight: 185 lb (83.9 kg) IBW/kg (Calculated) : 70.74  Vital Signs: Temp: 100.6 F (38.1 C) (08/09 1400) Temp Source: Core (08/09 0800) BP: 134/102 (08/09 0800) Pulse Rate: 73 (08/09 1400)  Labs: Recent Labs    03/31/19 0013  03/31/19 0339 03/31/19 0503 03/31/19 1204  HGB 17.0   < > 13.3 11.9* 12.8*  HCT 52.6*   < > 39.0 37.4* 38.9*  PLT 388  --   --  314 330  APTT  --   --   --  176*  --   LABPROT  --   --   --  88.3*  --   INR  --   --   --  >10.0*  --   CREATININE 1.72*  --   --  1.19 1.29*  TROPONINIHS 465*  --   --  5,839* 15,081*   < > = values in this interval not displayed.    Estimated Creatinine Clearance: 59.4 mL/min (A) (by C-G formula based on SCr of 1.29 mg/dL (H)).   Medical History: Past Medical History:  Diagnosis Date  . Medical history non-contributory     Medications:  Scheduled:  . [START ON 04/01/2019] aspirin  81 mg Per Tube Daily  . atorvastatin  80 mg Per Tube q1800  . chlorhexidine gluconate (MEDLINE KIT)  15 mL Mouth Rinse BID  . Chlorhexidine Gluconate Cloth  6 each Topical Daily  . feeding supplement (PRO-STAT SUGAR FREE 64)  30 mL Per Tube BID  . feeding supplement (VITAL HIGH PROTEIN)  1,000 mL Per Tube Q24H  . insulin aspart  0-9 Units Subcutaneous Q4H  . insulin aspart  3 Units Subcutaneous Q4H  . mouth rinse  15 mL Mouth Rinse 10 times per day  . pantoprazole sodium  40 mg Per Tube Daily  . sodium chloride flush  10-40 mL Intracatheter Q12H  . sodium chloride flush  3 mL Intravenous Q12H  . ticagrelor  90 mg Per Tube BID    Assessment: 63 yo M admitted for STEMI and taken to the cath lab. 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  Received bivalirudin, cangrelor,  and tirofiban in the cath. Bivalirudin off at 0815. Cangrelor off this AM with subsequent Brilinta dose. Tirofiban for 18 hrs post cath.   Heparin dosing per nursing protocol. Able to initiate heparin now that ACT is < 160 (158 per RN).  Goal of Therapy:  ACT 160-180 Monitor platelets by anticoagulation protocol: Yes   Plan:  Initiate heparin at 200 mL/hr Check ACT in 1 hour and every hour until in target range x 3 consecutive hours, then check every 4 hours unless out of range. Monitor for bleeding.  Vertis Kelch, PharmD PGY2 Cardiology Pharmacy Resident Phone [phone] 03/31/2019       2:26 PM  Please check [email or web address] for unit-specific pharmacist phone numbers    8/9 1547 Update:  ACT is 167 1 hour after initiating heparin at 200 mL/hr  Check ACT as noted in plan above.

## 2019-03-31 NOTE — Consult Note (Signed)
NAME:  Stephen Jennings, MRN:  [id number], DOB:  Jul 15, 1956, LOS: 0 ADMISSION DATE:  03/31/2019, CONSULTATION DATE:  03/31/2019 REFERRING MD:  Cards Claiborne Billings, CHIEF COMPLAINT:  Acute respiratory failure   Brief History   63 year old with no known PMH who presents with respiratory distress via EMS that was intubated in the ED.  Code STEMI was called and patient was taken to the cath lab with complete occlusion of the LAD with cardiogenic shock.  PCCM consulted for vent management and hemodynamic management.  History of present illness   63 year old with no known PMH who presents with respiratory distress via EMS that was intubated in the ED.  Code STEMI was called and patient was taken to the cath lab with complete occlusion of the LAD with cardiogenic shock.  PCCM consulted for vent management and hemodynamic management.  Past Medical History  None  Significant Hospital Events   Cardiac arrest and intubation 8/9  Consults:  PCCM Cards  Procedures:  ETT 8/9>>> Impella 8/9>>> L IJ TLC 8/9>>> Swan 8/9>>>  Significant Diagnostic Tests:  CXR that I reviewed myself 8/9>>>ETT ok, pulmonary edema  Micro Data:  N/A  Antimicrobials:  N/A   Interim history/subjective:  No events since cardiac arrest  Objective   Blood pressure (!) 134/102, pulse 72, temperature (!) 95.5 F (35.3 C), resp. rate (!) 34, height 5' 9.02" (1.753 m), weight 83.9 kg, SpO2 97 %. PAP: (32-74)/(10-38) 50/19 CVP:  [6 mmHg-22 mmHg] 12 mmHg PCWP:  [17 mmHg] 17 mmHg CO:  [3.5 L/min-5.6 L/min] 5.6 L/min CI:  [1.7 L/min/m2-2.8 L/min/m2] 2.8 L/min/m2  Vent Mode: PRVC FiO2 (%):  [50 %-100 %] 50 % Set Rate:  [18 bmp-34 bmp] 34 bmp Vt Set:  [530 mL-560 mL] 560 mL PEEP:  [5 cmH20] 5 cmH20 Plateau Pressure:  [23 cmH20-32 cmH20] 23 cmH20   Intake/Output Summary (Last 24 hours) at 03/31/2019 9563 Last data filed at 03/31/2019 0800 Gross per 24 hour  Intake 379.75 ml  Output 375 ml  Net 4.75 ml   Filed Weights   03/31/19 0008  Weight: 83.9 kg    Examination: General: Acutely ill appearing male, NAD HENT: [place]/AT, pupils are pin point but reactive, ETT in place Lungs: Crackles diffusely Cardiovascular: RRR, Nl S1/S2 and -M/R/G Abdomen: Soft, NT, ND and +BS Extremities: -edema and -tenderness Neuro: Sedate, not following commands, withdraws to pain Skin: Intact, lines noted  Resolved Hospital Problem list   N/A  Assessment & Plan:  63 year old with no known PMH who presents with respiratory distress via EMS that was intubated in the ED.  Code STEMI was called and patient was taken to the cath lab with complete occlusion of the LAD with cardiogenic shock.  PCCM consulted for vent management and hemodynamic management.  Acute respiratory failure: - Full vent support for today, hold weaning given hemodynamics - Titrate FiO2 for sat of 92-95% - CXR and ABG in AM - VAP prevention - Will need diureses when hemodynamics allow  Cardiogenic shock: - Epi as ordered - Norepi as ordered - Impella in place - Maintain MAP of 65 mmHg for now  Renal:  - Monitor for AKi with shock state - BMET in AM - Replace K  Acute encephalopathy: - Versed - Fentanyl - Maintain sedate for today  PCCM will continue to follow   Labs   CBC: Recent Labs  Lab 03/31/19 0013 03/31/19 0202 03/31/19 0217 03/31/19 0304 03/31/19 0339 03/31/19 0503  WBC 15.0*  --   --   --   --  16.8*  NEUTROABS 6.6  --   --   --   --  13.8*  HGB 17.0 13.9 12.6* 12.2* 13.3 11.9*  HCT 52.6* 41.0 37.0* 36.0* 39.0 37.4*  MCV 96.5  --   --   --   --  96.9  PLT 388  --   --   --   --  956    Basic Metabolic Panel: Recent Labs  Lab 03/31/19 0013 03/31/19 0202 03/31/19 0217 03/31/19 0304 03/31/19 0339 03/31/19 0503  NA 139 120* 127* 144 145 142  K 3.9 3.2* 3.5 4.8 4.7 3.7  CL 102  --   --   --   --  113*  CO2 24  --   --   --   --  20*  GLUCOSE 340*  --   --   --   --  244*  BUN 16  --   --   --   --  17  CREATININE  1.72*  --   --   --   --  1.19  CALCIUM 8.8*  --   --   --   --  6.6*  MG  --   --   --   --   --  1.8   GFR: Estimated Creatinine Clearance: 64.4 mL/min (by C-G formula based on SCr of 1.19 mg/dL). Recent Labs  Lab 03/31/19 0013 03/31/19 0503  WBC 15.0* 16.8*  LATICACIDVEN  --  3.0*    Liver Function Tests: Recent Labs  Lab 03/31/19 0013 03/31/19 0503  AST 45* 55*  ALT 28 18  ALKPHOS 109 70  BILITOT 1.0 0.8  PROT 7.8 4.5*  ALBUMIN 3.9 2.4*   No results for input(s): LIPASE, AMYLASE in the last 168 hours. No results for input(s): AMMONIA in the last 168 hours.  ABG    Component Value Date/Time   PHART 7.352 03/31/2019 0512   PCO2ART 37.8 03/31/2019 0512   PO2ART 199 (H) 03/31/2019 0512   HCO3 21.4 03/31/2019 0512   TCO2 28 03/31/2019 0339   ACIDBASEDEF 3.8 (H) 03/31/2019 0512   O2SAT 86.3 03/31/2019 0515     Coagulation Profile: Recent Labs  Lab 03/31/19 0503  INR >10.0*    Cardiac Enzymes: No results for input(s): CKTOTAL, CKMB, CKMBINDEX, TROPONINI in the last 168 hours.  HbA1C: Hgb A1c MFr Bld  Date/Time Value Ref Range Status  03/31/2019 05:03 AM 6.0 (H) 4.8 - 5.6 % Final    Comment:    (NOTE) Pre diabetes:          5.7%-6.4% Diabetes:              >6.4% Glycemic control for   <7.0% adults with diabetes     CBG: Recent Labs  Lab 03/31/19 0523 03/31/19 0747  GLUCAP 228* 245*    Review of Systems:   Unattainable  Past Medical History  He,  has a past medical history of Medical history non-contributory.   Surgical History    Past Surgical History:  Procedure Laterality Date  . NO PAST SURGERIES       Social History   reports that he has been smoking. He does not have any smokeless tobacco history on file. He reports that he does not drink alcohol or use drugs.   Family History   His Family history is unknown by patient.   Allergies No Known Allergies   Home Medications  Prior to Admission medications   Medication Sig  Start Date End  Date Taking? Authorizing Provider  erythromycin ophthalmic ointment Place into the right eye 3 (three) times daily. 01/09/14   Junius Creamer, NP  ibuprofen (ADVIL,MOTRIN) 200 MG tablet Take 400 mg by mouth every 6 (six) hours as needed (for pain.).    [provider]    The patient is critically ill with multiple organ systems failure and requires high complexity decision making for assessment and support, frequent evaluation and titration of therapies, application of advanced monitoring technologies and extensive interpretation of multiple databases.   Critical Care Time devoted to patient care services described in this note is  45  Minutes. This time reflects time of care of this signee Dr Jennet Maduro. This critical care time does not reflect procedure time, or teaching time or supervisory time of PA/NP/Med student/Med Resident etc but could involve care discussion time.  Rush Farmer, M.D. [facility] Pulmonary/Critical Care Medicine. Pager: [phone]. After hours pager: [phone].

## 2019-03-31 NOTE — Progress Notes (Signed)
Pharmacy Antibiotic Note  Stephen Jennings is a 63 y.o. male admitted on 03/31/2019 with unknown infection. Pt presented with cardiogenic shock, respiratory distress that required intubation in the ED. WBC 14 (elevated), Scr 1.29, febrile (Tmax 100.81F). Pharmacy has been consulted for empiric vancomycin and cefepime dosing.  Plan: Give Vancomycin load dose of 1750 mg x1 dose, then continue Vancomycin 1500 mg q24h   - AUC goal 400 - 550   - Expected AUC 462  - Scr used 1.29 Cefepime load with 2gm x1 dose, then continue Cefepime 2 gm q12h Monitor renal fx, clinical improvement, cx results, and vanc levels as appropriate  Height: 5' 9.02" (175.3 cm) Weight: 185 lb (83.9 kg) IBW/kg (Calculated) : 70.74  Temp (24hrs), Avg:96.4 F (35.8 C), Min:93.4 F (34.1 C), Max:100.9 F (38.3 C)  Recent Labs  Lab 03/31/19 0013 03/31/19 0503 03/31/19 1204 03/31/19 1205 03/31/19 1552  WBC 15.0* 16.8* 14.5*  --  14.0*  CREATININE 1.72* 1.19 1.29*  --  1.29*  LATICACIDVEN  --  3.0*  --  3.5* 2.5*    Estimated Creatinine Clearance: 59.4 mL/min (A) (by C-G formula based on SCr of 1.29 mg/dL (H)).    No Known Allergies  Antimicrobials this admission: 8/9 Vancomycin >>  8/9 Cefepime >>   Microbiology results: 8/9 BCx (x2): Pending 8/9 UCx: Pending 8/9 MRSA PCR: Neg  Thank you for allowing pharmacy to be a part of this patient's care.  Acey Lav, PharmD  PGY1 Acute Care Pharmacy Resident [phone] 03/31/2019 5:22 PM

## 2019-03-31 NOTE — CV Procedure (Cosign Needed)
Central Venous Catheter Insertion Procedure Note Stephen Jennings [id number] [birth year]    Procedure: Insertion of Central Venous Catheter Indications: Drug and/or fluid administration   Procedure Details Consent: Emergent consent Time Out: Verified patient identification, verified procedure, site/side was marked, verified correct patient position, special equipment/implants available, medications/allergies/relevent history reviewed, required imaging and test results available.  Performed   Maximum sterile technique was used including antiseptics, cap, gloves, gown, hand hygiene, mask and sheet. Skin prep: Chlorhexidine; local anesthetic administered A antimicrobial bonded/coated triple lumen catheter was placed in the left  internal jugular vein using the Seldinger technique and u/s guidance.   Evaluation Blood flow good Complications: No apparent complications Patient did tolerate procedure well. Chest X-ray ordered to verify placement.  CXR: ok   Stephen Bickers, MD  5:57 AM

## 2019-03-31 NOTE — ED Provider Notes (Signed)
[place] EMERGENCY DEPARTMENT Provider Note   CSN: [id number] Arrival date & time: 03/31/19  0001    History   Chief Complaint Chief Complaint  Patient presents with  . Respiratory Distress    HPI Albie Arizpe is a 63 y.o. male.   The history is provided by the EMS personnel. The history is limited by the condition of the patient (Severe respiratory distress).  He has no significant past history and arrived by ambulance in severe respiratory distress.  Apparently, he was doing well until shortly before family called for an ambulance.  He suddenly became very dyspneic and diaphoretic.  He is not able to give any history.  No past medical history on file.  There are no active problems to display for this patient.   No past surgical history on file.      Home Medications    Prior to Admission medications   Medication Sig Start Date End Date Taking? Authorizing Provider  erythromycin ophthalmic ointment Place into the right eye 3 (three) times daily. 01/09/14   Junius Creamer, NP  ibuprofen (ADVIL,MOTRIN) 200 MG tablet Take 400 mg by mouth every 6 (six) hours as needed (for pain.).    [provider]    Family History No family history on file.  Social History Social History   Tobacco Use  . Smoking status: Current Every Day Smoker  Substance Use Topics  . Alcohol use: No  . Drug use: No     Allergies   Patient has no known allergies.   Review of Systems Review of Systems  Unable to perform ROS: Severe respiratory distress     Physical Exam Updated Vital Signs Ht 5' 9.02" (1.753 m)   Wt 83.9 kg   SpO2 (!) 80%   BMI 27.31 kg/m   Physical Exam Vitals signs and nursing note reviewed.    63 year old male, diaphoretic and in severe respiratory distress. Vital signs are significant for elevated respiratory rate and heart rate. Oxygen saturation is 80%, which is hypoxic. Head is normocephalic and atraumatic. PERRLA, EOMI.  Oropharynx is clear. Neck is nontender and supple without adenopathy or JVD. Back is nontender and there is no CVA tenderness. Lungs are diffuse rales bilaterally. Chest is nontender. Heart has regular rate and rhythm without murmur. Abdomen is soft, flat, nontender without masses or hepatosplenomegaly and peristalsis is normoactive. Extremities have trace edema, full range of motion is present. Skin is warm and dry without rash. Neurologic: Awake but not responding to verbal stimuli, cranial nerves are intact, there are no gross motor deficits.  ED Treatments / Results  Labs (all labs ordered are listed, but only abnormal results are displayed) Labs Reviewed  BASIC METABOLIC PANEL - Abnormal; Notable for the following components:      Result Value   Glucose, Bld 340 (*)    Creatinine, Ser 1.72 (*)    Calcium 8.8 (*)    GFR calc non Af Amer 42 (*)    GFR calc Af Amer 48 (*)    All other components within normal limits  BRAIN NATRIURETIC PEPTIDE - Abnormal; Notable for the following components:   B Natriuretic Peptide 1,247.4 (*)    All other components within normal limits  CBC WITH DIFFERENTIAL/PLATELET - Abnormal; Notable for the following components:   WBC 15.0 (*)    HCT 52.6 (*)    Lymphs Abs 6.8 (*)    All other components within normal limits  TROPONIN I (HIGH SENSITIVITY) -  Abnormal; Notable for the following components:   Troponin I (High Sensitivity) 465 (*)    All other components within normal limits  SARS CORONAVIRUS 2 (HOSPITAL ORDER, [facility])  PROTIME-INR  APTT  LIPID PANEL  HEPATIC FUNCTION PANEL  HEPATIC FUNCTION PANEL  TROPONIN I (HIGH SENSITIVITY)    EKG EKG Interpretation  Date/Time:  Sunday March 31 2019 00:15:41 EDT Ventricular Rate:  107 PR Interval:    QRS Duration: 105 QT Interval:  346 QTC Calculation: 462 R Axis:   -31 Text Interpretation:  Sinus tachycardia Probable left atrial enlargement Incomplete RBBB  and LAFB Probable anterolateral infarct, acute >>> Acute MI <<< No old tracing to compare Confirmed by Delora Fuel ([id number]) on 03/31/2019 12:18:25 AM   Radiology Dg Chest Port 1 View  Result Date: 03/31/2019 CLINICAL DATA:  Shortness of breath EXAM: PORTABLE CHEST 1 VIEW COMPARISON:  None. FINDINGS: Cardiac shadows within normal limits. The lungs are well aerated bilaterally. Endotracheal tube is noted just below the thoracic inlet. The nasogastric catheter is noted with the tip in the stomach. Proximal side port lies in the distal esophagus. This could be advanced several cm into the stomach. Vascular congestion is noted as well as some patchy infiltrate particularly in the right mid and lower lung. IMPRESSION: Changes consistent with vascular congestion and diffuse edema worse on the right than the left. Electronically Signed   By: Inez Catalina M.D.   On: 03/31/2019 00:31    Procedures Procedure Name: Intubation Date/Time: 03/31/2019 12:10 AM Performed by: Delora Fuel, MD Pre-anesthesia Checklist: Patient identified, Patient being monitored, Emergency Drugs available, Timeout performed and Suction available Oxygen Delivery Method: Non-rebreather mask Preoxygenation: Pre-oxygenation with 100% oxygen Induction Type: Rapid sequence Ventilation: Mask ventilation without difficulty Laryngoscope Size: Glidescope and 3 Grade View: Grade I Tube size: 7.5 mm Number of attempts: 1 Airway Equipment and Method: Rigid stylet and Video-laryngoscopy Placement Confirmation: ETT inserted through vocal cords under direct vision,  CO2 detector and Breath sounds checked- equal and bilateral Secured at: 21 cm Tube secured with: ETT holder Dental Injury: Teeth and Oropharynx as per pre-operative assessment        CRITICAL CARE Performed by: Delora Fuel Total critical care time: 45 minutes Critical care time was exclusive of separately billable procedures and treating other patients. Critical care was  necessary to treat or prevent imminent or life-threatening deterioration. Critical care was time spent personally by me on the following activities: development of treatment plan with patient and/or surrogate as well as nursing, discussions with consultants, evaluation of patient's response to treatment, examination of patient, obtaining history from patient or surrogate, ordering and performing treatments and interventions, ordering and review of laboratory studies, ordering and review of radiographic studies, pulse oximetry and re-evaluation of patient's condition.  Medications Ordered in ED Medications  0.9 %  sodium chloride infusion (has no administration in time range)  propofol (DIPRIVAN) 1000 MG/100ML infusion (50 mcg/kg/min  83.9 kg Intravenous Bolus 03/31/19 0135)  fentaNYL (SUBLIMAZE) 100 MCG/2ML injection (has no administration in time range)  midazolam (VERSED) injection (2 mg Intravenous Given 03/31/19 0132)  Heparin (Porcine) in NaCl 1000-0.9 UT/500ML-% SOLN (500 mLs  Given 03/31/19 0133)  lidocaine (PF) (XYLOCAINE) 1 % injection (2 mLs  Given 03/31/19 0133)  Radial Cocktail/Verapamil only (10 mLs Intra-arterial Given 03/31/19 0133)  etomidate (AMIDATE) injection (20 mg Intravenous Given 03/31/19 0010)  succinylcholine (ANECTINE) injection (125 mg Intravenous Given 03/31/19 0011)  aspirin suppository 300 mg (300 mg Rectal Given  03/31/19 0033)  heparin injection 5,000 Units (5,000 Units Intravenous Given 03/31/19 0031)  ondansetron (ZOFRAN) injection 4 mg (4 mg Intravenous Given 03/31/19 0032)  fentaNYL (SUBLIMAZE) injection (100 mcg Intravenous Given 03/31/19 0047)  nitroGLYCERIN 100 mcg/mL intra-arterial injection (has no administration in time range)  verapamil (ISOPTIN) 2.5 MG/ML injection (has no administration in time range)  lidocaine (PF) (XYLOCAINE) 1 % injection (has no administration in time range)  Heparin (Porcine) in NaCl 1000-0.9 UT/500ML-% SOLN (has no administration in time range)   midazolam (VERSED) 2 MG/2ML injection (has no administration in time range)  bivalirudin (ANGIOMAX) 250 MG injection (has no administration in time range)  cangrelor (KENGREAL) 50 MG SOLR (has no administration in time range)     Initial Impression / Assessment and Plan / ED Course  I have reviewed the triage vital signs and the nursing notes.  Pertinent labs & imaging results that were available during my care of the patient were reviewed by me and considered in my medical decision making (see chart for details).  Acute respiratory distress.  Patient appeared to be starting to tire, so he was intubated.  Following intubation, ECG was obtained which shows STEMI, so code STEMI was activated.  He is given rectal aspirin and intravenous heparin.  Chest x-ray post intubation confirms pulmonary edema and adequate placement of ET tube.  Dr. Riley Lam of cardiology service is here to assist with his care until he can be taken to the catheterization lab.  Dr. Claiborne Billings has arrived and has reviewed the ECGs and is taking the patient to the catheterization lab.  Please note that ECG was not obtained immediately as patient was being treated for severe respiratory distress.  Labs have come back after patient has been taken to Cath Lab showing renal insufficiency and significantly elevated glucose, no prior labs to with which to compare.  BNP is also markedly elevated consistent with his presentation.  Final Clinical Impressions(s) / ED Diagnoses   Final diagnoses:  ST elevation myocardial infarction (STEMI), unspecified artery ([place])  Acute pulmonary edema ([place])  Acute hypoxemic respiratory failure ([facility])  Renal insufficiency  Elevated glucose level    ED Discharge Orders    None       Delora Fuel, MD 19/41/74 [phone]

## 2019-03-31 NOTE — ED Notes (Signed)
Paged fellow chmg to Dr  Mins

## 2019-03-31 NOTE — Progress Notes (Signed)
  Echocardiogram 2D Echocardiogram has been performed.  Stephen Jennings 03/31/2019, 6:13 PM

## 2019-03-31 NOTE — ED Triage Notes (Signed)
Per [facility], pt from home w/ a c/o sudden onset of resp distress. No recent hx of injury or illness. No med hx. Pt would not tolerate CPAP. Unable to speak. Tachypneic. Pale and diaphoretic. Would not track with his eyes.

## 2019-03-31 NOTE — Progress Notes (Signed)
  Under u/s guidance I personally pulled the Impella back 3cm to establish position in midventricle.  Secured in place at 86cm.   Flows and waveforms looked good on P-8  [name], MD  5:58 AM

## 2019-03-31 NOTE — Progress Notes (Signed)
eLink Physician-Brief Progress Note Patient Name: Stephen Jennings DOB: [birth year] MRN: [id number]   Date of Service  03/31/2019  HPI/Events of Note  80 F history of COPD presented with respiratory distress and intubated. EKG showed STEMI V1-V2. LHC with successful PCI of proximal LAD. Impella was eventually placed via RFA as patient required norepinephrine and several pushes of epinephrine.  eICU Interventions   Vent support  Continued on heparin, norepinephrine, epinephrine  Sedated on versed and fentanyl        Judd Lien 03/31/2019, 5:39 AM

## 2019-04-01 ENCOUNTER — Encounter (HOSPITAL_COMMUNITY): Payer: Self-pay | Admitting: *Deleted

## 2019-04-01 ENCOUNTER — Inpatient Hospital Stay (HOSPITAL_COMMUNITY): Payer: Self-pay

## 2019-04-01 LAB — BASIC METABOLIC PANEL
Anion gap: 8 (ref 5–15)
Anion gap: 7 (ref 5–15)
BUN: 18 mg/dL (ref 8–23)
BUN: 19 mg/dL (ref 8–23)
CO2: 21 mmol/L — ABNORMAL LOW (ref 22–32)
CO2: 22 mmol/L (ref 22–32)
Calcium: 7.5 mg/dL — ABNORMAL LOW (ref 8.9–10.3)
Calcium: 7.7 mg/dL — ABNORMAL LOW (ref 8.9–10.3)
Chloride: 111 mmol/L (ref 98–111)
Chloride: 107 mmol/L (ref 98–111)
Creatinine, Ser: 1.11 mg/dL (ref 0.61–1.24)
Creatinine, Ser: 0.92 mg/dL (ref 0.61–1.24)
GFR calc Af Amer: >60 mL/min (ref >60)
GFR calc Af Amer: >60 mL/min (ref >60)
GFR calc non Af Amer: >60 mL/min (ref >60)
GFR calc non Af Amer: >60 mL/min (ref >60)
Glucose, Bld: 159 mg/dL — ABNORMAL HIGH (ref 70–99)
Glucose, Bld: 149 mg/dL — ABNORMAL HIGH (ref 70–99)
Potassium: 3.6 mmol/L (ref 3.5–5.1)
Potassium: 3.5 mmol/L (ref 3.5–5.1)
Sodium: 140 mmol/L (ref 135–145)
Sodium: 136 mmol/L (ref 135–145)

## 2019-04-01 LAB — POCT ACTIVATED CLOTTING TIME
Activated Clotting Time: 169 seconds
Activated Clotting Time: 169 seconds
Activated Clotting Time: 175 seconds
Activated Clotting Time: 169 seconds
Activated Clotting Time: 180 seconds
Activated Clotting Time: 169 seconds
Activated Clotting Time: 720 seconds
Activated Clotting Time: 180 seconds
Activated Clotting Time: 169 seconds

## 2019-04-01 LAB — ABO/RH: ABO/RH(D): O POS

## 2019-04-01 LAB — GLUCOSE, CAPILLARY
Glucose-Capillary: 145 mg/dL — ABNORMAL HIGH (ref 70–99)
Glucose-Capillary: 141 mg/dL — ABNORMAL HIGH (ref 70–99)
Glucose-Capillary: 149 mg/dL — ABNORMAL HIGH (ref 70–99)
Glucose-Capillary: 150 mg/dL — ABNORMAL HIGH (ref 70–99)
Glucose-Capillary: 182 mg/dL — ABNORMAL HIGH (ref 70–99)

## 2019-04-01 LAB — COOXEMETRY PANEL
Carboxyhemoglobin: 1.5 % (ref 0.5–1.5)
Carboxyhemoglobin: 1.7 % — ABNORMAL HIGH (ref 0.5–1.5)
Methemoglobin: 0.9 % (ref 0.0–1.5)
Methemoglobin: 1 % (ref 0.0–1.5)
O2 Saturation: 64.6 %
O2 Saturation: 79 %
Total hemoglobin: 10.7 g/dL — ABNORMAL LOW (ref 12.0–16.0)
Total hemoglobin: 10.8 g/dL — ABNORMAL LOW (ref 12.0–16.0)

## 2019-04-01 LAB — HEPATIC FUNCTION PANEL
ALT: 21 U/L (ref 0–44)
AST: 79 U/L — ABNORMAL HIGH (ref 15–41)
Albumin: 2.4 g/dL — ABNORMAL LOW (ref 3.5–5.0)
Alkaline Phosphatase: 60 U/L (ref 38–126)
Bilirubin, Direct: 0.3 mg/dL — ABNORMAL HIGH (ref 0.0–0.2)
Indirect Bilirubin: 1 mg/dL — ABNORMAL HIGH (ref 0.3–0.9)
Total Bilirubin: 1.3 mg/dL — ABNORMAL HIGH (ref 0.3–1.2)
Total Protein: 4.3 g/dL — ABNORMAL LOW (ref 6.5–8.1)

## 2019-04-01 LAB — PHOSPHORUS: Phosphorus: 2.2 mg/dL — ABNORMAL LOW (ref 2.5–4.6)

## 2019-04-01 LAB — BLOOD GAS, ARTERIAL
Acid-base deficit: 3.8 mmol/L — ABNORMAL HIGH (ref 0.0–2.0)
Bicarbonate: 21.4 mmol/L (ref 20.0–28.0)
Drawn by: 33100
FIO2: 100
MECHVT: 530 mL
O2 Saturation: 99.1 %
PEEP: 5 cmH2O
Patient temperature: 93
RATE: 34 resp/min
pCO2 arterial: 37.8 mmHg (ref 32.0–48.0)
pH, Arterial: 7.352 (ref 7.350–7.450)
pO2, Arterial: 199 mmHg — ABNORMAL HIGH (ref 83.0–108.0)

## 2019-04-01 LAB — CBC
HCT: 32.5 % — ABNORMAL LOW (ref 39.0–52.0)
HCT: 29.6 % — ABNORMAL LOW (ref 39.0–52.0)
Hemoglobin: 10.7 g/dL — ABNORMAL LOW (ref 13.0–17.0)
Hemoglobin: 9.8 g/dL — ABNORMAL LOW (ref 13.0–17.0)
MCH: 30.7 pg (ref 26.0–34.0)
MCH: 30.8 pg (ref 26.0–34.0)
MCHC: 32.9 g/dL (ref 30.0–36.0)
MCHC: 33.1 g/dL (ref 30.0–36.0)
MCV: 93.4 fL (ref 80.0–100.0)
MCV: 93.1 fL (ref 80.0–100.0)
Platelets: 183 10*3/uL (ref 150–400)
Platelets: 129 10*3/uL — ABNORMAL LOW (ref 150–400)
RBC: 3.48 MIL/uL — ABNORMAL LOW (ref 4.22–5.81)
RBC: 3.18 MIL/uL — ABNORMAL LOW (ref 4.22–5.81)
RDW: 14.6 % (ref 11.5–15.5)
RDW: 14.8 % (ref 11.5–15.5)
WBC: 12.5 10*3/uL — ABNORMAL HIGH (ref 4.0–10.5)
WBC: 10.3 10*3/uL (ref 4.0–10.5)
nRBC: 0 % (ref 0.0–0.2)
nRBC: 0 % (ref 0.0–0.2)

## 2019-04-01 LAB — POCT I-STAT 7, (LYTES, BLD GAS, ICA,H+H)
Acid-base deficit: 2 mmol/L (ref 0.0–2.0)
Bicarbonate: 22.1 mmol/L (ref 20.0–28.0)
Calcium, Ion: 1.17 mmol/L (ref 1.15–1.40)
HCT: 29 % — ABNORMAL LOW (ref 39.0–52.0)
Hemoglobin: 9.9 g/dL — ABNORMAL LOW (ref 13.0–17.0)
O2 Saturation: 95 %
Patient temperature: 38.1
Potassium: 3.6 mmol/L (ref 3.5–5.1)
Sodium: 143 mmol/L (ref 135–145)
TCO2: 23 mmol/L (ref 22–32)
pCO2 arterial: 34.3 mmHg (ref 32.0–48.0)
pH, Arterial: 7.422 (ref 7.350–7.450)
pO2, Arterial: 78 mmHg — ABNORMAL LOW (ref 83.0–108.0)

## 2019-04-01 LAB — TYPE AND SCREEN
ABO/RH(D): O POS
Antibody Screen: NEGATIVE

## 2019-04-01 LAB — MAGNESIUM
Magnesium: 2 mg/dL (ref 1.7–2.4)
Magnesium: 1.7 mg/dL (ref 1.7–2.4)

## 2019-04-01 LAB — LACTIC ACID, PLASMA: Lactic Acid, Venous: 1.4 mmol/L (ref 0.5–1.9)

## 2019-04-01 LAB — HIV ANTIBODY (ROUTINE TESTING W REFLEX): HIV Screen 4th Generation wRfx: NONREACTIVE

## 2019-04-01 MED ORDER — SODIUM CHLORIDE 0.9 % IV BOLUS
250.0000 mL | Freq: Once | INTRAVENOUS | Status: AC
  Administered 2019-04-01: 16:00:00 250 mL via INTRAVENOUS

## 2019-04-01 MED ORDER — POTASSIUM CHLORIDE 10 MEQ/50ML IV SOLN
10.0000 meq | INTRAVENOUS | Status: AC
  Administered 2019-04-01 (×2): 10 meq via INTRAVENOUS
  Filled 2019-04-01 (×2): qty 50

## 2019-04-01 MED ORDER — SPIRONOLACTONE 12.5 MG HALF TABLET
12.5000 mg | ORAL_TABLET | Freq: Every day | ORAL | Status: DC
  Administered 2019-04-02 – 2019-04-07 (×6): 12.5 mg
  Filled 2019-04-01 (×6): qty 1

## 2019-04-01 MED ORDER — SODIUM CHLORIDE 0.9 % IV SOLN
INTRAVENOUS | Status: DC | PRN
  Administered 2019-04-01: 08:00:00 1000 mL via INTRAVENOUS

## 2019-04-01 MED ORDER — SPIRONOLACTONE 12.5 MG HALF TABLET
12.5000 mg | ORAL_TABLET | Freq: Every day | ORAL | Status: DC
  Administered 2019-04-01: 11:00:00 12.5 mg via ORAL
  Filled 2019-04-01: qty 1

## 2019-04-01 MED ORDER — PRO-STAT SUGAR FREE PO LIQD
30.0000 mL | Freq: Three times a day (TID) | ORAL | Status: DC
  Administered 2019-04-01 – 2019-04-07 (×16): 30 mL
  Filled 2019-04-01 (×15): qty 30

## 2019-04-01 MED ORDER — AMIODARONE LOAD VIA INFUSION
150.0000 mg | Freq: Once | INTRAVENOUS | Status: AC
  Administered 2019-04-01: 11:00:00 150 mg via INTRAVENOUS
  Filled 2019-04-01: qty 83.34

## 2019-04-01 MED ORDER — FUROSEMIDE 10 MG/ML IJ SOLN
40.0000 mg | Freq: Two times a day (BID) | INTRAMUSCULAR | Status: DC
  Administered 2019-04-01: 08:00:00 40 mg via INTRAVENOUS
  Filled 2019-04-01: qty 4

## 2019-04-01 MED ORDER — POTASSIUM CHLORIDE 10 MEQ/50ML IV SOLN
10.0000 meq | INTRAVENOUS | Status: AC
  Administered 2019-04-01 – 2019-04-02 (×4): 10 meq via INTRAVENOUS

## 2019-04-01 MED ORDER — MAGNESIUM SULFATE 2 GM/50ML IV SOLN
2.0000 g | Freq: Once | INTRAVENOUS | Status: AC
  Administered 2019-04-01: 22:00:00 2 g via INTRAVENOUS
  Filled 2019-04-01: qty 50

## 2019-04-01 MED ORDER — POTASSIUM CHLORIDE 10 MEQ/50ML IV SOLN
10.0000 meq | INTRAVENOUS | Status: AC
  Administered 2019-04-01 (×6): 10 meq via INTRAVENOUS
  Filled 2019-04-01 (×6): qty 50

## 2019-04-01 MED ORDER — AMIODARONE HCL IN DEXTROSE 360-4.14 MG/200ML-% IV SOLN
60.0000 mg/h | INTRAVENOUS | Status: DC
  Administered 2019-04-01 (×2): 60 mg/h via INTRAVENOUS
  Filled 2019-04-01 (×2): qty 200

## 2019-04-01 MED ORDER — AMIODARONE HCL IN DEXTROSE 360-4.14 MG/200ML-% IV SOLN
30.0000 mg/h | INTRAVENOUS | Status: DC
  Administered 2019-04-01 – 2019-04-09 (×16): 30 mg/h via INTRAVENOUS
  Filled 2019-04-01 (×15): qty 200

## 2019-04-01 MED ORDER — SODIUM CHLORIDE 0.9 % IV BOLUS
500.0000 mL | Freq: Once | INTRAVENOUS | Status: AC
  Administered 2019-04-01: 10:00:00 500 mL via INTRAVENOUS

## 2019-04-01 MED ORDER — DIGOXIN 125 MCG PO TABS
0.1250 mg | ORAL_TABLET | Freq: Every day | ORAL | Status: DC
  Administered 2019-04-02 – 2019-04-07 (×6): 0.125 mg
  Filled 2019-04-01 (×6): qty 1

## 2019-04-01 MED ORDER — POTASSIUM CHLORIDE 10 MEQ/50ML IV SOLN
INTRAVENOUS | Status: AC
  Administered 2019-04-01: 10 meq via INTRAVENOUS
  Filled 2019-04-01: qty 200

## 2019-04-01 MED ORDER — VITAL 1.5 CAL PO LIQD
1000.0000 mL | ORAL | Status: DC
  Administered 2019-04-01 – 2019-04-03 (×3): 1000 mL
  Filled 2019-04-01 (×13): qty 1000

## 2019-04-01 MED ORDER — DIGOXIN 125 MCG PO TABS
0.1250 mg | ORAL_TABLET | Freq: Every day | ORAL | Status: DC
  Administered 2019-04-01: 11:00:00 0.125 mg via ORAL
  Filled 2019-04-01: qty 1

## 2019-04-01 MED FILL — Medication: Qty: 1 | Status: AC

## 2019-04-01 MED FILL — Nitroglycerin IV Soln 100 MCG/ML in D5W: INTRA_ARTERIAL | Qty: 10 | Status: AC

## 2019-04-01 MED FILL — Verapamil HCl IV Soln 2.5 MG/ML: INTRAVENOUS | Qty: 2 | Status: CN

## 2019-04-01 NOTE — Progress Notes (Signed)
Called Bensimhon MD about suction alarms, CVP of 7, flows down from 3.6 to 3.3. Orders for 250 mL NS bolus.

## 2019-04-01 NOTE — Progress Notes (Signed)
NAME:  Stephen Jennings, MRN:  [id number], DOB:  [birth year], LOS: 1 ADMISSION DATE:  03/31/2019, CONSULTATION DATE:  03/31/2019 REFERRING MD:  Dr. Claiborne Billings, Cardiology, CHIEF COMPLAINT:  Acute respiratory failure  Brief History   41M with no known PMHx who presented with respiratory distress via EMS, intubated in the ED.  Code STEMI was called and patient was taken to the cath lab with complete occlusion of the LAD with cardiogenic shock.  PCCM consulted for vent management and hemodynamic management.  History of present illness   Stephen Jennings is a 7 year old gentleman with no known PMHx who presented with respiratory distress via EMS. Per EMS notes, patient's wife advised that he had been fine all day and then as he was getting ready for bed suddenly became short of breath "out of nowhere." He was intubated in the ED. A code STEMI was called and patient was taken to the cath lab with complete occlusion of the LAD and subsequently developed cardiogenic shock. Impella device was placed and he was started on dual pressor support with norepinephrine and epinephrine. PCCM consulted for vent management and hemodynamic management.  Past Medical History  No known past medical history  Significant Hospital Events   03/31/19: Intubation; Anterior STEMI with 100% LAD occlusion, s/p PCI/DES, Impella  03/31/19: Code, CPR and 1 amp epi given with immediate ROSC    Consults:  Cardiology 8/9 Heart Failure Team 8/9   Procedures:  03/31/19: ETT placed  03/31/19: PCI to LAD with DES placement. Impella placement. Left IJ TLC and Swan Ganz catheter.  Significant Diagnostic Tests:  03/31/19: Left heart cath showing 100% occlusion of LAD, 70% stenosis of RCA.  Micro Data:  MRSA PCR 8/9 >> negative SARS CoV 2 8/9 >> negative Blood Cx 8/9 >> no growth to date Urine Cx 8/9 >>  Antimicrobials:  Vancomycin (8/9) Cefepime (8/9- )  Interim history/subjective:  Patient had code yesterday 8/9, he received CPR and 1 amp of  epinephrine with immediate ROSC.  Overnight, patient had Tmax of 38.3 and he remains febrile this morning (8/10) to 38.1.  Patient is intubated and sedated with midazolam and fentanyl. MAPs above goal on norepinephrine 16 and epinephrine 5, full support with Impella (P9).  Objective   Blood pressure 93/71, pulse 69, temperature 99.9 F (37.7 C), resp. rate (!) 30, height 5' 9.02" (1.753 m), weight 92.5 kg, SpO2 97 %. PAP: (25-52)/(11-21) 33/12 CVP:  [6 mmHg-18 mmHg] 7 mmHg PCWP:  [10 mmHg-15 mmHg] 10 mmHg CO:  [5.6 L/min-6.8 L/min] 6.8 L/min CI:  [2.8 L/min/m2-3.4 L/min/m2] 3.4 L/min/m2  Vent Mode: PRVC FiO2 (%):  [40 %] 40 % Set Rate:  [30 bmp-34 bmp] 30 bmp Vt Set:  [560 mL] 560 mL PEEP:  [5 cmH20] 5 cmH20 Plateau Pressure:  [19 cmH20-23 cmH20] 23 cmH20   Intake/Output Summary (Last 24 hours) at 04/01/2019 1244 Last data filed at 04/01/2019 1200 Gross per 24 hour  Intake 6071.28 ml  Output 4810 ml  Net 1261.28 ml   Filed Weights   03/31/19 0008 04/01/19 0345  Weight: 83.9 kg 92.5 kg    Examination: General: Intubated, sedated. HENT: ETT in place. Left IJ cath in place. Pupils equal, constricted, minimally reactive on fentanyl gtt. Lungs: Clear to anterior auscultation, no crackles appreciated; mild wheezing noted. Equal breath sounds bilaterally. Cardiovascular: Normal rate, regular rhythm, S3 noted. No murmurs appreciated. Abdomen: Soft, non-tender, non-distended. Positive bowel sounds. Extremities: Trace edema in bilateral lower extremities. Impella catheter in place in right groin. Skin:  Warm and dry. Neuro: Sedated. Does not follow commands. Does withdraw to noxious stimuli. GU: Foley catheter in place, draining normal appearing urine.  CXR: Consistent with pulmonary edema. Unchanged position of ETT, left IJ catheter, and Impella device. Swan-Ganz catheter may extend slightly distal to the mediastinal shadow, recommend pulling back.   Resolved Hospital Problem list      Assessment & Plan:  Stephen Jennings is a 63 year old gentleman with no known PMH who presented with acute respiratory distress via EMS, intubated in the ED. Code STEMI was called and patient was taken to the cath lab with complete occlusion of the LAD, subsequently developed cardiogenic shock. He is s/p PCI with DES of the LAD and Impella placement, requiring dual vasopressor support with norepinephrine and epinephrine. PCCM consulted for vent management and hemodynamic management.  Acute Respiratory Failure - Continue PRVC, will not pursue weaning at this time given that positive pressure ventilation is also providing support to underlying cardiogenic shock. - Titrate FiO2 for saturation of 92-95%. - Maintain sedation with midazolam and fentanyl at this time given cardiogenic shock on full Impella and dual pressor support.  - Daily ABG to ensure he does not become excessively alkalotic. - VAP precautions. - Diuresis as tolerated.  Cardiogenic Shock Patient remains on full Impella support (P9) as well as norepinephrine and epinephrine. Echo 8/9 post-procedure showed reduced LV systolic function with ERF 20-25%, septal apical and anterior wall hypokinesis; RV with moderately reduced systolic function. - Followed by Advanced Heart Failure Team. - Continue norepinephrine and epinephrine, weaning slowly. MAP goal > 65. - Continue Impella support, management per Heart Failure Team. - Per Heart Failure Team, diuresis with furosemide 40mg  IV BID, initiate IV amiodarone, digoxin, and spironolactone. - Supplement potassium. - Follow H/H. - Swan-Ganz catheter may extend slightly distal to mediastinal shadow on 8/10 CXR, recommend pulling back slightly.  Fever, Leukocytosis Patient febrile with Tmax 38.3, leukocytosis but white count trending down. Likely systemic inflammatory response given acute illness and cardiogenic shock. BCx with no growth to date. - Discontinue vancomycin, MRSA screen negative.  - Continue cefepime (8/9- ). - Follow culture data.  Anterior STEMI, s/p PCI/DES - Continue aspirin, ticagrelor, and atorvastatin.  Renal Serum Cr down-trending from 1.72 on admission to 1.11 today (8/10). - Continue to monitor for renal injury given shock state. - Follow BMP. - Replete potassium.   Elevated Blood Glucose Blood glucose elevated to 340 on admission and hemoglobin A1c of 6.0. No known history of diabetes.  - Continue SSI.  Best practice:  Diet: NPO Pain/Anxiety/Delirium protocol (if indicated): Yes VAP protocol (if indicated): Yes DVT prophylaxis: On heparin GI prophylaxis: Pantoprazole Glucose control: SSI Mobility: Bedrest Code Status: Full code Family Communication: Plan to update patient's primary contact Disposition: ICU  Labs   CBC: Recent Labs  Lab 03/31/19 0013  03/31/19 0503 03/31/19 1204 03/31/19 1552 03/31/19 1558 04/01/19 0317 04/01/19 0444  WBC 15.0*  --  16.8* 14.5* 14.0*  --  12.5*  --   NEUTROABS 6.6  --  13.8*  --   --   --   --   --   HGB 17.0   < > 11.9* 12.8* 12.1* 11.6* 10.7* 9.9*  HCT 52.6*   < > 37.4* 38.9* 36.9* 34.0* 32.5* 29.0*  MCV 96.5  --  96.9 94.2 94.1  --  93.4  --   PLT 388  --  314 330 291  --  183  --    < > = values in this  interval not displayed.    Basic Metabolic Panel: Recent Labs  Lab 03/31/19 0013  03/31/19 0503 03/31/19 1204 03/31/19 1552 03/31/19 1558 04/01/19 0317 04/01/19 0444  NA 139   < > 142 140 143 146* 140 143  K 3.9   < > 3.7 4.3 3.9 4.1 3.6 3.6  CL 102  --  113* 112* 115*  --  111  --   CO2 24  --  20* 19* 20*  --  21*  --   GLUCOSE 340*  --  244* 187* 129*  --  159*  --   BUN 16  --  17 17 18   --  18  --   CREATININE 1.72*  --  1.19 1.29* 1.29*  --  1.11  --   CALCIUM 8.8*  --  6.6* 7.4* 7.6*  --  7.5*  --   MG  --   --  1.8  --  1.8  --  2.0  --   PHOS  --   --   --   --   --   --  2.2*  --    < > = values in this interval not displayed.   GFR: Estimated Creatinine Clearance:  77.5 mL/min (by C-G formula based on SCr of 1.11 mg/dL). Recent Labs  Lab 03/31/19 0503 03/31/19 1204 03/31/19 1205 03/31/19 1552 04/01/19 0317  WBC 16.8* 14.5*  --  14.0* 12.5*  LATICACIDVEN 3.0*  --  3.5* 2.5*  --     Liver Function Tests: Recent Labs  Lab 03/31/19 0013 03/31/19 0503 04/01/19 0317  AST 45* 55* 79*  ALT 28 18 21   ALKPHOS 109 70 60  BILITOT 1.0 0.8 1.3*  PROT 7.8 4.5* 4.3*  ALBUMIN 3.9 2.4* 2.4*   No results for input(s): LIPASE, AMYLASE in the last 168 hours. No results for input(s): AMMONIA in the last 168 hours.  ABG    Component Value Date/Time   PHART 7.422 04/01/2019 0444   PCO2ART 34.3 04/01/2019 0444   PO2ART 78.0 (L) 04/01/2019 0444   HCO3 22.1 04/01/2019 0444   TCO2 23 04/01/2019 0444   ACIDBASEDEF 2.0 04/01/2019 0444   O2SAT 95.0 04/01/2019 0444     Coagulation Profile: Recent Labs  Lab 03/31/19 0503  INR >10.0*    Cardiac Enzymes: No results for input(s): CKTOTAL, CKMB, CKMBINDEX, TROPONINI in the last 168 hours.  HbA1C: Hgb A1c MFr Bld  Date/Time Value Ref Range Status  03/31/2019 05:03 AM 6.0 (H) 4.8 - 5.6 % Final    Comment:    (NOTE) Pre diabetes:          5.7%-6.4% Diabetes:              >6.4% Glycemic control for   <7.0% adults with diabetes     CBG: Recent Labs  Lab 03/31/19 0747 03/31/19 1104 03/31/19 1504 03/31/19 1954 03/31/19 2356  GLUCAP 245* 191* 113* 147* 145*    Past Medical History  He,  has a past medical history of Medical history non-contributory.   Surgical History    Past Surgical History:  Procedure Laterality Date  . CORONARY/GRAFT ACUTE MI REVASCULARIZATION N/A 03/31/2019   Procedure: Coronary/Graft Acute MI Revascularization;  Surgeon: Troy Sine, MD;  Location: [facility];  Service: Cardiovascular;  Laterality: N/A;  . LEFT HEART CATH AND CORONARY ANGIOGRAPHY N/A 03/31/2019   Procedure: LEFT HEART CATH AND CORONARY ANGIOGRAPHY;  Surgeon: Troy Sine, MD;  Location:  [facility];  Service: Cardiovascular;  Laterality: N/A;  . NO PAST SURGERIES    . RIGHT HEART CATH N/A 03/31/2019   Procedure: RIGHT HEART CATH;  Surgeon: Troy Sine, MD;  Location: [facility];  Service: Cardiovascular;  Laterality: N/A;  . VENTRICULAR ASSIST DEVICE INSERTION N/A 03/31/2019   Procedure: VENTRICULAR ASSIST DEVICE INSERTION;  Surgeon: Troy Sine, MD;  Location: [facility];  Service: Cardiovascular;  Laterality: N/A;     Social History   reports that he has been smoking. He does not have any smokeless tobacco history on file. He reports that he does not drink alcohol or use drugs.   Family History   His Family history is unknown by patient.   Allergies No Known Allergies   Home Medications  Prior to Admission medications   Medication Sig Start Date End Date Taking? Authorizing Provider  Naphazoline HCl (CLEAR EYES OP) Place 1 drop into both eyes daily as needed (dry eyes/ irritation).   Yes [provider]     Atilano Ina, MS4

## 2019-04-01 NOTE — Progress Notes (Signed)
ANTICOAGULATION CONSULT NOTE   Pharmacy Consult for heparin Indication: Impella  No Known Allergies  Patient Measurements: Height: 5' 9.02" (175.3 cm) Weight: 203 lb 14.8 oz (92.5 kg) IBW/kg (Calculated) : 70.74  Vital Signs: Temp: 99.9 F (37.7 C) (08/10 1215) Temp Source: Core (08/10 1200) BP: 93/71 (08/10 1200) Pulse Rate: 69 (08/10 1215)  Labs: Recent Labs    03/31/19 0503 03/31/19 1204 03/31/19 1552 03/31/19 1558 04/01/19 0317 04/01/19 0444  HGB 11.9* 12.8* 12.1* 11.6* 10.7* 9.9*  HCT 37.4* 38.9* 36.9* 34.0* 32.5* 29.0*  PLT 314 330 291  --  183  --   APTT 176*  --   --   --   --   --   LABPROT 88.3*  --   --   --   --   --   INR >10.0*  --   --   --   --   --   CREATININE 1.19 1.29* 1.29*  --  1.11  --   TROPONINIHS 5,839* 15,081* 17,032*  --   --   --     Estimated Creatinine Clearance: 77.5 mL/min (by C-G formula based on SCr of 1.11 mg/dL).   Medical History: Past Medical History:  Diagnosis Date  . Medical history non-contributory     Medications:  Scheduled:  . aspirin  81 mg Per Tube Daily  . atorvastatin  80 mg Per Tube q1800  . chlorhexidine gluconate (MEDLINE KIT)  15 mL Mouth Rinse BID  . Chlorhexidine Gluconate Cloth  6 each Topical Daily  . [START ON 04/02/2019] digoxin  0.125 mg Per Tube Daily  . feeding supplement (PRO-STAT SUGAR FREE 64)  30 mL Per Tube TID  . insulin aspart  0-9 Units Subcutaneous Q4H  . insulin aspart  3 Units Subcutaneous Q4H  . mouth rinse  15 mL Mouth Rinse 10 times per day  . pantoprazole sodium  40 mg Per Tube Daily  . sodium chloride flush  10-40 mL Intracatheter Q12H  . sodium chloride flush  3 mL Intravenous Q12H  . [START ON 04/02/2019] spironolactone  12.5 mg Per Tube Daily  . ticagrelor  90 mg Per Tube BID    Assessment: 63 yo M admitted for STEMI and taken to the cath lab. Found to have severe 3-vessel CAD with complete occlusion of the LAD and cardiogenic shock - s/p DES to LAD and Impella CP  placement.  Received bivalirudin, cangrelor, and tirofiban in the cath. These have now been discontinued.  Heparin dosing per nursing protocol. Systemic heparin currently running at 800 units/hr with ACTs in 160s. No bleeding noted, hgb continues to trend down.   Goal of Therapy:  ACT 160-180 Monitor platelets by anticoagulation protocol: Yes   Plan:  Continue heparin per ACT nomogram at bedside Follow cbc closely  Erin Hearing PharmD., BCPS Clinical Pharmacist 04/01/2019 12:23 PM

## 2019-04-01 NOTE — Progress Notes (Addendum)
D/w Dr. Haroldine Laws results of CBC and BMET. Orders received for 4 runs K, 2gm Mg. Made aware or oral bleeding and bloody snot at beginning of shift. BLE pulses dopplerable. CO/CI 5.2/2.6, BP 117/64(75), HR 61, impella flows 3.3. On 15 NE, 2 Epi. Orders received for CBC, LDH, and Coox for AM labs. Continuing to monitor closely.

## 2019-04-01 NOTE — Progress Notes (Addendum)
Advanced Heart Failure Rounding Note  PCP-Cardiologist: No primary care provider on file.   Subjective:   8/9 Code - CPR,  given 1 am epi. Epi drip increased to 12  Remains intubated/sedated. Norepi 20 mcg & Epi 5 mcg.  Blood CX pending.   Impella P 9- 3.6 liters  Cardiac Power 1  Swan #s  40/19 CVP 13  SVR 776   Papi 1.6  CO 6 CI 3   Objective:   Weight Range: 92.5 kg Body mass index is 30.1 kg/m.   Vital Signs:   Temp:  [94.8 F (34.9 C)-100.9 F (38.3 C)] 100.6 F (38.1 C) (08/10 0700) Pulse Rate:  [60-79] 70 (08/10 0700) Resp:  [20-34] 30 (08/10 0700) BP: (99-134)/(59-102) 99/67 (08/10 0600) SpO2:  [95 %-100 %] 98 % (08/10 0700) Arterial Line BP: (101-159)/(58-97) 127/62 (08/10 0700) FiO2 (%):  [40 %-60 %] 40 % (08/10 0330) Weight:  [92.5 kg] 92.5 kg (08/10 0345)    Weight change: Filed Weights   03/31/19 0008 04/01/19 0345  Weight: 83.9 kg 92.5 kg    Intake/Output:   Intake/Output Summary (Last 24 hours) at 04/01/2019 6599 Last data filed at 04/01/2019 0700 Gross per 24 hour  Intake 5122.79 ml  Output 2950 ml  Net 2172.79 ml      Physical Exam    General:  Intubated HEENT: ETT Neck: Supple. JVP 12-13 . Carotids 2+ bilat; no bruits. No lymphadenopathy or thyromegaly appreciated. Cor: PMI nondisplaced. Regular rate & rhythm. No rubs or murmurs. + S3  Lungs: Clear Abdomen: Soft, nontender, nondistended. No hepatosplenomegaly. No bruits or masses. Good bowel sounds. Extremities: warm, no cyanosis, clubbing, rash, 1+edema. R groin impella . RLE immobilizer  Neuro: Intubated  GU: Foley yellow urine  Telemetry   SR NSVT 70s personally reviewed.   EKG    N/a   Labs    CBC Recent Labs    03/31/19 0013  03/31/19 0503  03/31/19 1552  04/01/19 0317 04/01/19 0444  WBC 15.0*  --  16.8*   < > 14.0*  --  12.5*  --   NEUTROABS 6.6  --  13.8*  --   --   --   --   --   HGB 17.0   < > 11.9*   < > 12.1*   < > 10.7* 9.9*  HCT 52.6*   < >  37.4*   < > 36.9*   < > 32.5* 29.0*  MCV 96.5  --  96.9   < > 94.1  --  93.4  --   PLT 388  --  314   < > 291  --  183  --    < > = values in this interval not displayed.   Basic Metabolic Panel Recent Labs    03/31/19 1552  04/01/19 0317 04/01/19 0444  NA 143   < > 140 143  K 3.9   < > 3.6 3.6  CL 115*  --  111  --   CO2 20*  --  21*  --   GLUCOSE 129*  --  159*  --   BUN 18  --  18  --   CREATININE 1.29*  --  1.11  --   CALCIUM 7.6*  --  7.5*  --   MG 1.8  --  2.0  --   PHOS  --   --  2.2*  --    < > = values in this interval not displayed.  Liver Function Tests Recent Labs    03/31/19 0503 04/01/19 0317  AST 55* 79*  ALT 18 21  ALKPHOS 70 60  BILITOT 0.8 1.3*  PROT 4.5* 4.3*  ALBUMIN 2.4* 2.4*   No results for input(s): LIPASE, AMYLASE in the last 72 hours. Cardiac Enzymes No results for input(s): CKTOTAL, CKMB, CKMBINDEX, TROPONINI in the last 72 hours.  BNP: BNP (last 3 results) Recent Labs    03/31/19 0013  BNP 1,247.4*    ProBNP (last 3 results) No results for input(s): PROBNP in the last 8760 hours.   D-Dimer No results for input(s): DDIMER in the last 72 hours. Hemoglobin A1C Recent Labs    03/31/19 0503  HGBA1C 6.0*   Fasting Lipid Panel Recent Labs    03/31/19 0013  CHOL 253*  HDL 46  LDLCALC 188*  TRIG 94  CHOLHDL 5.5   Thyroid Function Tests Recent Labs    03/31/19 1205  TSH 0.887    Other results:   Imaging     No results found.   Medications:     Scheduled Medications: . aspirin  81 mg Per Tube Daily  . atorvastatin  80 mg Per Tube q1800  . chlorhexidine gluconate (MEDLINE KIT)  15 mL Mouth Rinse BID  . Chlorhexidine Gluconate Cloth  6 each Topical Daily  . feeding supplement (PRO-STAT SUGAR FREE 64)  30 mL Per Tube BID  . feeding supplement (VITAL HIGH PROTEIN)  1,000 mL Per Tube Q24H  . insulin aspart  0-9 Units Subcutaneous Q4H  . insulin aspart  3 Units Subcutaneous Q4H  . mouth rinse  15 mL Mouth  Rinse 10 times per day  . pantoprazole sodium  40 mg Per Tube Daily  . sodium chloride flush  10-40 mL Intracatheter Q12H  . sodium chloride flush  3 mL Intravenous Q12H  . ticagrelor  90 mg Per Tube BID     Infusions: . sodium chloride 10 mL/hr at 04/01/19 0700  . sodium chloride 10 mL/hr at 03/31/19 0700  . sodium chloride 10 mL/hr at 04/01/19 0700  . ceFEPime (MAXIPIME) IV    . epinephrine 5 mcg/min (04/01/19 0700)  . fentaNYL infusion INTRAVENOUS 300 mcg/hr (04/01/19 0700)  . impella catheter heparin 50 unit/mL in dextrose 5%    . heparin 800 Units/hr (04/01/19 0700)  . midazolam 5 mg/hr (04/01/19 0700)  . norepinephrine (LEVOPHED) Adult infusion 19 mcg/min (04/01/19 0700)  . propofol (DIPRIVAN) infusion Stopped (03/31/19 0551)  . vancomycin       PRN Medications:  sodium chloride, sodium chloride, acetaminophen, nitroGLYCERIN, ondansetron (ZOFRAN) IV, sodium chloride flush, sodium chloride flush   *  Assessment/Plan   1. Cardiogenic shock due to acute anterior MI - s/p PCI/DES to LAD on 8/9 - Now with Impella support. P9 with CO 6/ CI 3.  - Continue NE 20 mcg /EPI 5 mcg  support. Continue to wean NE down. Cut  - CVP trending up. Will need to start 40 mg IV lasix twice daily. K 3.6 . Give 6 runs of K .  -SBP >100. Lactic Acid trending down 3.5>2.5.    2. CAD with acute anterior MI 8/9 - s/p PCI/DES to LAD. 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 Acute hypoxic respiratory failure - in setting of #1 - Intubated. CCM managing.   4. DM2 - new diagnosis - cover with SSI  5. COPD - ongoing tobacco use - needs cessation  Length of Stay: 1  Amy Clegg, NP  04/01/2019, 7:12 AM  Advanced Heart Failure Team Pager [phone] (M-F; [place])  Please contact [facility] for night-coverage after hours (4p -7a ) and weekends on [email or web address]  Agree with above.   He remains critically ill.  Intubated and sedated. On epi 5/NE 20. Impella P-9. On vent 40. Got one dose IV lasix with > 1L out.   Echo EF 20-25% RV moderately down Personally reviewed  Swan numbers CVP 6 PCWP 15 CO 6.3L  Co-ox 76%   On exam  Intubated/sedated +ETT Cor RRR + impella sum Ab obese NT/ND Ext warm. No edema perfusion ok  R groin Impella site ok   Under u/s guidance I pulled Impella back 1cm. Now sitting midventricle.  Good flows and waveforms. Flow 3.7   He remains critically ill on dual pressors and full Impella support. Now beginning to stabilize. Will give back some fluid today as CVP low and having more ectopy. Start IV amio. Wean inotropes very slowly. Watch h/h. Start digoxin and spiro.   CRITICAL CARE Performed by: [name]  Total critical care time: 50 minutes  Critical care time was exclusive of separately billable procedures and treating other patients.  Critical care was necessary to treat or prevent imminent or life-threatening deterioration.  Critical care was time spent personally by me (independent of midlevel providers or residents) on the following activities: development of treatment plan with patient and/or surrogate as well as nursing, discussions with consultants, evaluation of patient's response to treatment, examination of patient, obtaining history from patient or surrogate, ordering and performing treatments and interventions, ordering and review of laboratory studies, ordering and review of radiographic studies, pulse oximetry and re-evaluation of patient's condition.  [name], MD  10:50 AM

## 2019-04-01 NOTE — Progress Notes (Signed)
Initial Nutrition Assessment  DOCUMENTATION CODES:   Not applicable  INTERVENTION:   Tube feeding: - Vital 1.5 @ 55 ml/hr (1320 ml/day) via OG tube - Pro-stat 30 ml TID  Tube feeding regimen provides 2280 kcal, 134 grams of protein, and 1071 ml of H2O.  NUTRITION DIAGNOSIS:   Inadequate oral intake related to inability to eat as evidenced by NPO status.  GOAL:   Patient will meet greater than or equal to 90% of their needs  MONITOR:   Labs, Weight trends, TF tolerance, I & O's  REASON FOR ASSESSMENT:   Ventilator, Consult Enteral/tube feeding initiation and management  ASSESSMENT:   63 year old male who presented to the ED on 8/09 with severe respiratory distress. PMH of COPD. Pt required intubation in the ED. Pt found to have STEMI. CXR showing pulmonary edema. Pt brought to the cath lab. Angiography demonstrated an occluded proximal LAD which was successfully stented. Pt now with cardiogenic shock. Impella placed.  8/09 - Code, CPR  Adult ICU Tube Feeding Protocol initiated 8/09. RD to adjust to better meet pt's needs.  Discussed pt with RN and during ICU rounds. Per RN, pt tolerating current tube feeding regimen well.  OGT in place.  Pt with mild pitting edema to BUE and BLE.  Patient is currently intubated on ventilator support MV: 16.3 L/min Temp (24hrs), Avg:100.4 F (38 C), Min:98.6 F (37 C), Max:100.9 F (38.3 C) BP (a-line): 113/64 MAP (a-line): 75  Drips: Amiodarone: 33.3 ml/hr Epinephrine: 18.7 ml/hr Fentanyl: 30 ml/hr Heparin: 8 ml/hr Versed: 5 ml/hr Levophed: 15 ml/hr  Medications reviewed and include: Lasix 40 mg BID, SSI q 4 hours, Novolog 3 units q 4 hours, Protonix, IV abx, IV KCl 10 mEq x 6 runs  Labs reviewed: hemoglobin 9., phosphorus 2.2 CBG's: 113-191 x 24 hours  UOP: 2800 ml x 24 hours OGT: 150 ml x 24 hours I/O's: +1.8 L since admit  Diet Order:   Diet Order            Diet NPO time specified  Diet effective now               EDUCATION NEEDS:   No education needs have been identified at this time  Skin:  Skin Assessment: Reviewed RN Assessment  Last BM:  no documented BM  Height:   Ht Readings from Last 1 Encounters:  03/31/19 5' 9.02" (1.753 m)    Weight:   Wt Readings from Last 1 Encounters:  04/01/19 92.5 kg    Ideal Body Weight:  72.7 kg  BMI:  Body mass index is 30.1 kg/m.  Estimated Nutritional Needs:   Kcal:  2274  Protein:  120-135 grams  Fluid:  >/= 2.0 L    Gaynell Face, MS, RD, LDN Inpatient Clinical Dietitian Pager: [phone] Weekend/After Hours: [phone]

## 2019-04-01 NOTE — Progress Notes (Signed)
[facility] ADULT ICU REPLACEMENT PROTOCOL FOR AM LAB REPLACEMENT ONLY  The patient does apply for the [facility] Adult ICU Electrolyte Replacment Protocol based on the criteria listed below:   1. Is GFR >/= 40 ml/min? Yes.    Patient's GFR today is >60 2. Is urine output >/= 0.5 ml/kg/hr for the last 6 hours? Yes.   Patient's UOP is 0.9 ml/kg/hr 3. Is BUN < 60 mg/dL? Yes.    Patient's BUN today is 18 4. Abnormal electrolyte(s): k 3.6 5. Ordered repletion with: protocol 6. If a panic level lab has been reported, has the CCM MD in charge been notified? No..   Physician:   Replaced via central line d/t ectopy  Laurance Heide A 04/01/2019 4:25 AM

## 2019-04-01 NOTE — Progress Notes (Signed)
Bensimhon MD to bedside to assess Impella placement under ultrasound. Position good. Impella turned down to P7 by MD.

## 2019-04-01 NOTE — Progress Notes (Signed)
  CTSP due to frequent suction alarms on P-9.  Remains on Epi 2 and NE 12. Has received 500cc NS. Now on amio for frequent ectopy. Suction alarms improved on P-8. Getting flows of 3.2-3.4 on P-8. CO ~ 6.8 L   I personally assessed position of Impella on bedside u/s. Impella sitting mid ventricle. Perhaps slightly deep but overall good position. Pump turned down to P-7.  Groin site and LE perfusion ok. Lactic acidosis has resolved.  CBC and BMET ordered.   If remains stable overnight will start Impella wean in am.  Additional 35 mins CCT.   [name], MD  9:05 PM

## 2019-04-02 ENCOUNTER — Inpatient Hospital Stay (HOSPITAL_COMMUNITY): Payer: Self-pay

## 2019-04-02 LAB — POCT ACTIVATED CLOTTING TIME
Activated Clotting Time: 169 seconds
Activated Clotting Time: 180 seconds
Activated Clotting Time: 175 seconds
Activated Clotting Time: 169 seconds
Activated Clotting Time: 169 seconds
Activated Clotting Time: 175 s

## 2019-04-02 LAB — HEPATIC FUNCTION PANEL
ALT: 18 U/L (ref 0–44)
AST: 47 U/L — ABNORMAL HIGH (ref 15–41)
Albumin: 2.3 g/dL — ABNORMAL LOW (ref 3.5–5.0)
Alkaline Phosphatase: 52 U/L (ref 38–126)
Bilirubin, Direct: 0.2 mg/dL (ref 0.0–0.2)
Indirect Bilirubin: 0.7 mg/dL (ref 0.3–0.9)
Total Bilirubin: 0.9 mg/dL (ref 0.3–1.2)
Total Protein: 4.9 g/dL — ABNORMAL LOW (ref 6.5–8.1)

## 2019-04-02 LAB — CBC
HCT: 30 % — ABNORMAL LOW (ref 39.0–52.0)
HCT: 29.5 % — ABNORMAL LOW (ref 39.0–52.0)
Hemoglobin: 10 g/dL — ABNORMAL LOW (ref 13.0–17.0)
Hemoglobin: 9.5 g/dL — ABNORMAL LOW (ref 13.0–17.0)
MCH: 31.2 pg (ref 26.0–34.0)
MCH: 30.5 pg (ref 26.0–34.0)
MCHC: 33.3 g/dL (ref 30.0–36.0)
MCHC: 32.2 g/dL (ref 30.0–36.0)
MCV: 93.5 fL (ref 80.0–100.0)
MCV: 94.9 fL (ref 80.0–100.0)
Platelets: 128 10*3/uL — ABNORMAL LOW (ref 150–400)
Platelets: 115 10*3/uL — ABNORMAL LOW (ref 150–400)
RBC: 3.21 MIL/uL — ABNORMAL LOW (ref 4.22–5.81)
RBC: 3.11 MIL/uL — ABNORMAL LOW (ref 4.22–5.81)
RDW: 14.9 % (ref 11.5–15.5)
RDW: 15 % (ref 11.5–15.5)
WBC: 11.7 10*3/uL — ABNORMAL HIGH (ref 4.0–10.5)
WBC: 10.5 10*3/uL (ref 4.0–10.5)
nRBC: 0 % (ref 0.0–0.2)
nRBC: 0 % (ref 0.0–0.2)

## 2019-04-02 LAB — POCT I-STAT 7, (LYTES, BLD GAS, ICA,H+H)
Acid-base deficit: 2 mmol/L (ref 0.0–2.0)
Bicarbonate: 21.9 mmol/L (ref 20.0–28.0)
Calcium, Ion: 1.23 mmol/L (ref 1.15–1.40)
HCT: 27 % — ABNORMAL LOW (ref 39.0–52.0)
Hemoglobin: 9.2 g/dL — ABNORMAL LOW (ref 13.0–17.0)
O2 Saturation: 95 %
Patient temperature: 37.3
Potassium: 4.3 mmol/L (ref 3.5–5.1)
Sodium: 138 mmol/L (ref 135–145)
TCO2: 23 mmol/L (ref 22–32)
pCO2 arterial: 33.3 mmHg (ref 32.0–48.0)
pH, Arterial: 7.427 (ref 7.350–7.450)
pO2, Arterial: 72 mmHg — ABNORMAL LOW (ref 83.0–108.0)

## 2019-04-02 LAB — COOXEMETRY PANEL
Carboxyhemoglobin: 1.2 % (ref 0.5–1.5)
Carboxyhemoglobin: 1.5 % (ref 0.5–1.5)
Methemoglobin: 0.8 % (ref 0.0–1.5)
Methemoglobin: 1.4 % (ref 0.0–1.5)
O2 Saturation: 61.4 %
O2 Saturation: 64.7 %
Total hemoglobin: 9.8 g/dL — ABNORMAL LOW (ref 12.0–16.0)
Total hemoglobin: 9.7 g/dL — ABNORMAL LOW (ref 12.0–16.0)

## 2019-04-02 LAB — PHOSPHORUS: Phosphorus: 2.2 mg/dL — ABNORMAL LOW (ref 2.5–4.6)

## 2019-04-02 LAB — BASIC METABOLIC PANEL
Anion gap: 8 (ref 5–15)
BUN: 19 mg/dL (ref 8–23)
CO2: 21 mmol/L — ABNORMAL LOW (ref 22–32)
Calcium: 8.1 mg/dL — ABNORMAL LOW (ref 8.9–10.3)
Chloride: 107 mmol/L (ref 98–111)
Creatinine, Ser: 0.92 mg/dL (ref 0.61–1.24)
GFR calc Af Amer: >60 mL/min (ref >60)
GFR calc non Af Amer: >60 mL/min (ref >60)
Glucose, Bld: 161 mg/dL — ABNORMAL HIGH (ref 70–99)
Potassium: 4.3 mmol/L (ref 3.5–5.1)
Sodium: 136 mmol/L (ref 135–145)

## 2019-04-02 LAB — GLUCOSE, CAPILLARY
Glucose-Capillary: 126 mg/dL — ABNORMAL HIGH (ref 70–99)
Glucose-Capillary: 133 mg/dL — ABNORMAL HIGH (ref 70–99)
Glucose-Capillary: 145 mg/dL — ABNORMAL HIGH (ref 70–99)
Glucose-Capillary: 145 mg/dL — ABNORMAL HIGH (ref 70–99)
Glucose-Capillary: 146 mg/dL — ABNORMAL HIGH (ref 70–99)
Glucose-Capillary: 152 mg/dL — ABNORMAL HIGH (ref 70–99)
Glucose-Capillary: 158 mg/dL — ABNORMAL HIGH (ref 70–99)
Glucose-Capillary: 172 mg/dL — ABNORMAL HIGH (ref 70–99)

## 2019-04-02 LAB — MAGNESIUM: Magnesium: 2 mg/dL (ref 1.7–2.4)

## 2019-04-02 LAB — LACTATE DEHYDROGENASE: LDH: 701 U/L — ABNORMAL HIGH (ref 98–192)

## 2019-04-02 LAB — URINE CULTURE: Culture: NO GROWTH

## 2019-04-02 MED ORDER — "THROMBI-PAD 3""X3"" EX PADS"
1.0000 | MEDICATED_PAD | Freq: Once | CUTANEOUS | Status: AC
  Administered 2019-04-02: 14:00:00 1 via TOPICAL
  Filled 2019-04-02: qty 1

## 2019-04-02 MED ORDER — MILRINONE LACTATE IN DEXTROSE 20-5 MG/100ML-% IV SOLN
0.1250 ug/kg/min | INTRAVENOUS | Status: DC
  Administered 2019-04-02 – 2019-04-10 (×7): 0.125 ug/kg/min via INTRAVENOUS
  Filled 2019-04-02 (×7): qty 100

## 2019-04-02 MED ORDER — FENTANYL BOLUS VIA INFUSION
30.0000 ug | INTRAVENOUS | Status: DC | PRN
  Administered 2019-04-02 – 2019-04-04 (×11): 30 ug via INTRAVENOUS
  Filled 2019-04-02: qty 30

## 2019-04-02 NOTE — Progress Notes (Addendum)
NAME:  Stephen Jennings, MRN:  [id number], DOB:  11-16-1955, LOS: 2 ADMISSION DATE:  03/31/2019, CONSULTATION DATE:  03/31/2019 REFERRING MD:  Cards Claiborne Billings, CHIEF COMPLAINT:  Acute respiratory failure   Brief History   63 year old with no known PMH who presents with respiratory distress via EMS that was intubated in the ED.  Code STEMI was called and patient was taken to the cath lab with complete occlusion of the LAD with cardiogenic shock.  PCCM consulted for vent management and hemodynamic management.  History of present illness   63 year old with no known PMH who presents with respiratory distress via EMS that was intubated in the ED.  Code STEMI was called and patient was taken to the cath lab with complete occlusion of the LAD with cardiogenic shock.  PCCM consulted for vent management and hemodynamic management.  Past Medical History  None  Significant Hospital Events   Cardiac arrest and intubation 8/9  Consults:  PCCM Cards  Procedures:  ETT 8/9>>> Impella 8/9>>> L IJ TLC 8/9>>> Swan 8/9>>>  Significant Diagnostic Tests:  CXR that I reviewed myself 8/11>> Haziness of the chest likely 2/2 atelectasis and layering pleural fluid. Vascular congestion with improved interstitial opacity. No pneumothorax. Normal heart size. Stable, unremarkable hardware positioning. Probable improvement in pulmonary edema.  03/31/2019 Echo The left ventricle has severely reduced systolic function, with an ejection fraction of 20-25%. The cavity size was moderately dilated. Left ventricular diastolic function could not be evaluated. Septal apical and anterior wall hyppkinesis Impella device seen in LV extending to mid ventrical CW through AV suggests low output. The right ventricle has moderately reduced systolic function. The cavity was mildly enlarged. There is no increase in right ventricular wall thickness. Left atrial size was moderately dilated. Right atrial size was mildly dilated. The  aortic valve was not well visualized. Impella device seen coursing across AV. The aorta is normal in size and structure. The interatrial septum was not well visualized.  Micro Data:  N/A  Antimicrobials:  N/A   Interim history/subjective:  Remains on 40%, 5 PEEP, rate of 30 , TV of 560 , PRVC mode with ABG 7.42/ 33.3/ 72 / 21 / Sat 95% Impella turned down overnight >> P7 Didn't tolerate diuresis well>> replaced fluid 8/11  pm CVP 9-10 SVR 1121 CO 4.7 CI 2.4 Norepi 13 mcg & Epi 2 mcg. Amio gtt remains at  ( 30 mg / HR) with decrease in arrythmias Lytes supplemented overnight ( Mag and K) Added Milrinone as CI and CO decreased overnight Fentanyl at 300 mcg/ hr and versed at 5 mg/hr Lactic Acid down trending>> 1.4 LDH increasing >> 701   Objective   Blood pressure 98/67, pulse 61, temperature 98.8 F (37.1 C), resp. rate (!) 30, height 5' 9.02" (1.753 m), weight 93.5 kg, SpO2 98 %. PAP: (25-50)/(10-20) 42/13 CVP:  [6 mmHg-15 mmHg] 8 mmHg PCWP:  [10 mmHg-14 mmHg] 10 mmHg CO:  [4.8 L/min-6.8 L/min] 4.8 L/min CI:  [2.4 L/min/m2-3.4 L/min/m2] 2.4 L/min/m2  Vent Mode: PRVC FiO2 (%):  [40 %] 40 % Set Rate:  [30 bmp] 30 bmp Vt Set:  [560 mL] 560 mL PEEP:  [5 cmH20] 5 cmH20 Plateau Pressure:  [21 cmH20-29 cmH20] 23 cmH20   Intake/Output Summary (Last 24 hours) at 04/02/2019 0923 Last data filed at 04/02/2019 0900 Gross per 24 hour  Intake 5895.47 ml  Output 3655 ml  Net 2240.47 ml   Filed Weights   03/31/19 0008 04/01/19 0345 04/02/19  0500  Weight: 83.9 kg 92.5 kg 93.5 kg    Examination: General: Acutely ill appearing male, sedated and intubated HENT: [place]/AT, pupils are pin point but reactive, ETT in place, bloody oral and nasal secretions Lungs: Bilateral chest excursion, Coarse throughout with wheezing noted bilaterally Cardiovascular: RRR, with Impella hum noted Abdomen: Soft, NT, ND and +BS, TF infusing Extremities: -edema and -tenderness,Impella and SG in place via  right groin with some oozing noted to site >> Right foot slightly cool to touch, pulses dopplerable Neuro: Sedate on fentanyl at 300 mcg/ hr , not following commands, withdraws to pain Skin: Intact, lines noted, no rash or lesions, some mottling to right foot  Resolved Hospital Problem list   N/A  Assessment & Plan:  63 year old with no known PMH who presents with respiratory distress via EMS that was intubated in the ED.  Code STEMI was called and patient was taken to the cath lab with complete occlusion of the LAD with cardiogenic shock.  PCCM consulted for vent management and hemodynamic management.  Acute respiratory failure: Smoker ? COPD Wheezing noted on exam - Full vent support for today, hold weaning given hemodynamics - Titrate FiO2 for sat of 92-95% - CXR and ABG in AM - VAP prevention - No diuresis for now as patient did not tolerate 8/10 - Consider addition of Xopenex prn  Will need smoking cessation as OP  Cardiogenic shock: CO and CI decreased overnight 8/11 - Continue Epi per cards - Norepi >> wean to 10 as able per cards goal - Added Milrinone at 0.125 mcg/kg/min - Impella in place>> P 7>> wean as hemodynamics improve - Maintain MAP of 65 mmHg for now  Renal:  Repleted K and Mag overnight 8/10 Creatinine stable - Monitor for AKI with shock state - BMET in AM - Replace K  Acute encephalopathy: - Versed - Fentanyl - Maintain sedate for today - prn added for breakthrough  CAD with recent STEMI - ASA and Brillinta. - Atorvastatin - hold BB and ACEi until hemodynamics have improved  Anemia>> HGB 9.2 On Heparin and anti platelets CBC today at 1300 per cards Trend CBC and platelets daily until stable  PCCM will continue to follow   Labs   CBC: Recent Labs  Lab 03/31/19 0013  03/31/19 0503 03/31/19 1204 03/31/19 1552  04/01/19 0317 04/01/19 0444 04/01/19 2204 04/02/19 0423 04/02/19 0439  WBC 15.0*  --  16.8* 14.5* 14.0*  --  12.5*  --  10.3  11.7*  --   NEUTROABS 6.6  --  13.8*  --   --   --   --   --   --   --   --   HGB 17.0   < > 11.9* 12.8* 12.1*   < > 10.7* 9.9* 9.8* 10.0* 9.2*  HCT 52.6*   < > 37.4* 38.9* 36.9*   < > 32.5* 29.0* 29.6* 30.0* 27.0*  MCV 96.5  --  96.9 94.2 94.1  --  93.4  --  93.1 93.5  --   PLT 388  --  314 330 291  --  183  --  129* 128*  --    < > = values in this interval not displayed.    Basic Metabolic Panel: Recent Labs  Lab 03/31/19 0503 03/31/19 1204 03/31/19 1552  04/01/19 0317 04/01/19 0444 04/01/19 2109 04/02/19 0423 04/02/19 0439  NA 142 140 143   < > 140 143 136 136 138  K 3.7 4.3 3.9   < >  3.6 3.6 3.5 4.3 4.3  CL 113* 112* 115*  --  111  --  107 107  --   CO2 20* 19* 20*  --  21*  --  22 21*  --   GLUCOSE 244* 187* 129*  --  159*  --  149* 161*  --   BUN 17 17 18   --  18  --  19 19  --   CREATININE 1.19 1.29* 1.29*  --  1.11  --  0.92 0.92  --   CALCIUM 6.6* 7.4* 7.6*  --  7.5*  --  7.7* 8.1*  --   MG 1.8  --  1.8  --  2.0  --  1.7 2.0  --   PHOS  --   --   --   --  2.2*  --   --  2.2*  --    < > = values in this interval not displayed.   GFR: Estimated Creatinine Clearance: 94 mL/min (by C-G formula based on SCr of 0.92 mg/dL). Recent Labs  Lab 03/31/19 0503  03/31/19 1205 03/31/19 1552 04/01/19 0317 04/01/19 1312 04/01/19 2204 04/02/19 0423  WBC 16.8*   < >  --  14.0* 12.5*  --  10.3 11.7*  LATICACIDVEN 3.0*  --  3.5* 2.5*  --  1.4  --   --    < > = values in this interval not displayed.    Liver Function Tests: Recent Labs  Lab 03/31/19 0013 03/31/19 0503 04/01/19 0317 04/02/19 0423  AST 45* 55* 79* 47*  ALT 28 18 21 18   ALKPHOS 109 70 60 52  BILITOT 1.0 0.8 1.3* 0.9  PROT 7.8 4.5* 4.3* 4.9*  ALBUMIN 3.9 2.4* 2.4* 2.3*   No results for input(s): LIPASE, AMYLASE in the last 168 hours. No results for input(s): AMMONIA in the last 168 hours.  ABG    Component Value Date/Time   PHART 7.427 04/02/2019 0439   PCO2ART 33.3 04/02/2019 0439   PO2ART 72.0  (L) 04/02/2019 0439   HCO3 21.9 04/02/2019 0439   TCO2 23 04/02/2019 0439   ACIDBASEDEF 2.0 04/02/2019 0439   O2SAT 61.4 04/02/2019 0440     Coagulation Profile: Recent Labs  Lab 03/31/19 0503  INR >10.0*    Cardiac Enzymes: No results for input(s): CKTOTAL, CKMB, CKMBINDEX, TROPONINI in the last 168 hours.  HbA1C: Hgb A1c MFr Bld  Date/Time Value Ref Range Status  03/31/2019 05:03 AM 6.0 (H) 4.8 - 5.6 % Final    Comment:    (NOTE) Pre diabetes:          5.7%-6.4% Diabetes:              >6.4% Glycemic control for   <7.0% adults with diabetes     CBG: Recent Labs  Lab 04/01/19 1107 04/01/19 1507 04/01/19 1943 04/01/19 2345 04/02/19 0436  GLUCAP 182* 150* 145* 158* 152*    Review of Systems:   Unattainable  Past Medical History  He,  has a past medical history of Medical history non-contributory.   Surgical History    Past Surgical History:  Procedure Laterality Date  . CORONARY/GRAFT ACUTE MI REVASCULARIZATION N/A 03/31/2019   Procedure: Coronary/Graft Acute MI Revascularization;  Surgeon: Troy Sine, MD;  Location: [facility];  Service: Cardiovascular;  Laterality: N/A;  . LEFT HEART CATH AND CORONARY ANGIOGRAPHY N/A 03/31/2019   Procedure: LEFT HEART CATH AND CORONARY ANGIOGRAPHY;  Surgeon: Troy Sine, MD;  Location: [facility];  Service: Cardiovascular;  Laterality: N/A;  . NO PAST SURGERIES    . RIGHT HEART CATH N/A 03/31/2019   Procedure: RIGHT HEART CATH;  Surgeon: Troy Sine, MD;  Location: [facility];  Service: Cardiovascular;  Laterality: N/A;  . VENTRICULAR ASSIST DEVICE INSERTION N/A 03/31/2019   Procedure: VENTRICULAR ASSIST DEVICE INSERTION;  Surgeon: Troy Sine, MD;  Location: [facility];  Service: Cardiovascular;  Laterality: N/A;     Social History   reports that he has been smoking. He has never used smokeless tobacco. He reports that he does not drink alcohol or use drugs.   Family History    His Family history is unknown by patient.   Allergies No Known Allergies   Home Medications  Prior to Admission medications   Medication Sig Start Date End Date Taking? Authorizing Provider  erythromycin ophthalmic ointment Place into the right eye 3 (three) times daily. 01/09/14   Junius Creamer, NP  ibuprofen (ADVIL,MOTRIN) 200 MG tablet Take 400 mg by mouth every 6 (six) hours as needed (for pain.).    [provider]    The patient is critically ill with multiple organ systems failure and requires high complexity decision making for assessment and support, frequent evaluation and titration of therapies, application of advanced monitoring technologies and extensive interpretation of multiple databases.   Critical Care Time devoted to patient care services described in this note is  45  Minutes. This time reflects time of care of this signee Dr Jennet Maduro. This critical care time does not reflect procedure time, or teaching time or supervisory time of PA/NP/Med student/Med Resident etc but could involve care discussion time.  Magdalen Spatz, MSN, [facility]. Pager: [phone] After 4 pm call : [phone].  04/02/2019 9:24 AM  Critical care attending attestation note:  Patient seen and examined and relevant ancillary tests reviewed.  I agree with the assessment and plan of care as outlined by Gladstone Pih, NP. This patient was not seen as a shared visit. The following reflects my independent critical care time.   Synopsis of assessment and plan:  63 year old man who presented with sudden dyspnea. Family reports prior episodes of RSCP and diaphoresis.  Underwent PCI of proximal LAD with DES for AS STEMI. TIMI 3 flow post intervention.  Impella CR placed for MCS.  Impella dropped to P7 due to suction events. Echo had previously shown that LV well decompressed by Impella.  On examination: Remains sedated. Minimal grimacing to painful  stimuli. PRVC with no dyssynchrony and clear chest. HS distant. No murmur. Capillary refill 3-6s. No edema. CVP 10. Abdomen soft. Distal pulses present by Doppler with no discoloration.  Creatinine 0.9. SCvO2 64  ASSESSMENT:  Critically ill due to cardiogenic shock requiring vasopressor and inotropic support in addition to MCS. LV decompressed with low filling pressures.  No diuresis today. Wean NE, once <10, can wean EPI to off if possible  Continue MIL, may require low dose NE to tolerate.  Critically ill due to respiratory failure requiring mechanical ventilation Continue full support until after Impella removed Slow sedation wean to prevent drug accumulation but maintain RASS -3 to -2.    CRITICAL CARE Performed by: Kipp Brood   Total critical care time: 35 minutes  Critical care time was exclusive of separately billable procedures and treating other patients.  Critical care was necessary to treat or prevent imminent or life-threatening deterioration.  Critical care was time  spent personally by me on the following activities: development of treatment plan with patient and/or surrogate as well as nursing, discussions with consultants, evaluation of patient's response to treatment, examination of patient, obtaining history from patient or surrogate, ordering and performing treatments and interventions, ordering and review of laboratory studies, ordering and review of radiographic studies, pulse oximetry, re-evaluation of patient's condition and participation in multidisciplinary rounds.  Kipp Brood, MD [facility] ICU Physician [place]  Pager: [phone] Mobile: [phone] After hours: [phone].  04/02/2019, 5:21 PM

## 2019-04-02 NOTE — Progress Notes (Signed)
  Removes on EPI at 3. NE down to 14. Tolerating low-dose milrinone. CO now back up to 6L on Impella p-7.  Impella flows and waveforms look ok.   Hgb stable at 9.5. Co-ox 65% LDH 701  Electrolytes and renal function now stable.   I have turned Impella down to P-6.  Will try to begin Impella wean tomorrow.   Additional CCT 35 mins.  [name], MD  5:54 PM

## 2019-04-02 NOTE — Progress Notes (Signed)
[facility] Progress Note Patient Name: Stephen Jennings DOB: 22-Oct-1955 MRN: [id number]   Date of Service  04/02/2019  HPI/Events of Note  Orders pended by medical student never co-signed.   eICU Interventions  Will order: 1. Mg++, Phosphorus and Hepatic function panel this AM. 2. Portable CXR now.      Intervention Category Major Interventions: Other:  Lysle Dingwall 04/02/2019, 4:46 AM

## 2019-04-02 NOTE — Progress Notes (Signed)
Called [facility] to check on AM labs being in process for extended amount of time. They are checking on the sample and it will result soon.

## 2019-04-02 NOTE — Progress Notes (Signed)
NAME:  Stephen Jennings, MRN:  [id number], DOB:  [birth year], LOS: 2 ADMISSION DATE:  03/31/2019, CONSULTATION DATE:  03/31/2019 REFERRING MD:  Dr. Claiborne Billings, Cardiology, CHIEF COMPLAINT:  Acute respiratory failure  Brief History   70M with no known PMHx who presented with respiratory distress via EMS, intubated in the ED.  Code STEMI was called and patient was taken to the cath lab with complete occlusion of the LAD with cardiogenic shock.  PCCM consulted for vent management and hemodynamic management.  History of present illness   Stephen Jennings is a 33 year old gentleman with no known PMHx who presented with respiratory distress via EMS. Per EMS notes, patient's wife advised that he had been fine all day and then as he was getting ready for bed suddenly became short of breath "out of nowhere." He was intubated in the ED. A code STEMI was called and patient was taken to the cath lab with complete occlusion of the LAD and subsequently developed cardiogenic shock. Impella device was placed and he was started on dual pressor support with norepinephrine and epinephrine. PCCM consulted for vent management and hemodynamic management.  Past Medical History  No known past medical history  Significant Hospital Events   03/31/19: Intubation; Anterior STEMI with 100% LAD occlusion, s/p PCI/DES, Impella  03/31/19: Code, CPR and 1 amp epi given with immediate ROSC    Consults:  Cardiology 8/9 Heart Failure Team 8/9   Procedures:  03/31/19: ETT placed  03/31/19: PCI to LAD with DES placement. Impella placement.  03/31/19: Left IJ TLC  03/31/19: Swan-Ganz catheter  Significant Diagnostic Tests:  03/31/19 Left heart cath: Revealed 100% occlusion of LAD, 70% stenosis of RCA.  03/31/19 Echocardiogram: The left ventricle has severely reduced systolic function, with an ejection fraction of 20-25%. The cavity size was moderately dilated. Left ventricular diastolic function could not be evaluated. Septal apical and anterior wall  hyppkinesis Impella device seen in LV extending to mid ventrical CW through AV suggests low output. The right ventricle has moderately reduced systolic function. The cavity was mildly enlarged. There is no increase in right ventricular wall thickness. Left atrial size was moderately dilated. Right atrial size was mildly dilated. The aortic valve was not well visualized. Impella device seen coursing across AV. The aorta is normal in size and structure. The interatrial septum was not well visualized   Micro Data:  MRSA PCR 8/9 >> negative SARS CoV 2 8/9 >> negative Blood Cx 8/9 >> no growth to date Urine Cx 8/9 >> no growth  Antimicrobials:  Vancomycin (8/9) Cefepime (8/9- )  Interim history/subjective:  Overnight, Impella turned down to P-7. Heart Failure Team deferring further weaning today given some reduction in CO (4.8) and CI (2.4). He received one dose of Lasix 8/10 am, but did not tolerate well and received 7110mL IVF. Ectopy improved on amiodorone gtt.  Stephen Jennings remains intubated and sedated, on norepinephrine 14 and epinephrine 2. He was afebrile overnight and has a good pulse pressure. Per nursing, he has had some oozing from R groin site as well as from oral and nasal secretions.    Objective   Blood pressure 96/64, pulse 68, temperature 99 F (37.2 C), resp. rate (!) 30, height 5' 9.02" (1.753 m), weight 93.5 kg, SpO2 97 %. PAP: (29-50)/(10-21) 35/14 CVP:  [6 mmHg-15 mmHg] 8 mmHg PCWP:  [10 mmHg-14 mmHg] 10 mmHg CO:  [4.8 L/min-5.9 L/min] 5.9 L/min CI:  [2.4 L/min/m2-2.9 L/min/m2] 2.9 L/min/m2  Vent Mode: PRVC FiO2 (%):  [40 %]  40 % Set Rate:  [30 bmp] 30 bmp Vt Set:  [560 mL] 560 mL PEEP:  [5 cmH20] 5 cmH20 Plateau Pressure:  [21 cmH20-29 cmH20] 23 cmH20   Intake/Output Summary (Last 24 hours) at 04/02/2019 1335 Last data filed at 04/02/2019 1300 Gross per 24 hour  Intake 4939.71 ml  Output 2380 ml  Net 2559.71 ml   Filed Weights   03/31/19 0008 04/01/19  0345 04/02/19 0500  Weight: 83.9 kg 92.5 kg 93.5 kg    Examination: General: Intubated, sedated. HENT: ETT in place. Left IJ cath in place. Pupils equal, constricted, minimally reactive on fentanyl gtt. There are bloody oral and nasal secretions noted. Lungs: Equal breath sounds bilaterally. Wheezing noted bilaterally. Cardiovascular: Normal rate, regular rhythm, distant heart sounds. No murmurs appreciated. Abdomen: Soft, non-distended. Positive bowel sounds. Extremities: No lower extremity edema. Impella catheter in place in right groin. Right foot is slightly cool to the touch. Pulses able to be obtained with Doppler. Neuro: Sedated. Does not follow commands. Does withdraw to noxious stimuli. GU: Foley catheter in place, draining normal appearing urine.  CXR: Stable positioning of lines. Haziness of the chest likely from atelectasis and layering pleural fluid. Vascular congestion with improved interstitial opacity. No pneumothorax. No cardiomegaly. Some improvement in pulmonary edema.  Resolved Hospital Problem list     Assessment & Plan:  Stephen Jennings is a 55 year old gentleman with no known PMH who presented with acute respiratory distress via EMS, intubated in the ED. Code STEMI was called and patient was taken to the cath lab with complete occlusion of the LAD, subsequently developed cardiogenic shock. He is s/p PCI with DES of the LAD and Impella placement, requiring dual vasopressor support with norepinephrine and epinephrine. PCCM consulted for vent management and hemodynamic management.  Acute Respiratory Failure - Continue PRVC, will not pursue weaning at this time given hemodynamics and support provided by positive pressure ventilation. - Titrate FiO2 for saturation of 92-95%. - Wean sedation with midazolam and fentanyl slowly. - Daily ABG to ensure he does not become excessively alkalotic. - VAP precautions. - Holding diuresis.   Cardiogenic Shock Patient is now on  Impella support of P-7, and continues on norepinephrine and epinephrine. Echo 8/9 post-procedure showed reduced LV systolic function with ERF 20-25%, septal apical and anterior wall hypokinesis; RV with moderately reduced systolic function. - Followed by Advanced Heart Failure Team. - Continue norepinephrine and epinephrine, weaning slowly. MAP goal > 65. - Continue Impella support, management per Heart Failure Team. - Per Heart Failure Team, he will start on milrinone today and continue on amiodarone gtt, digoxin, and spironolactone. - Supplement potassium as needed. - Follow H/H.  Anemia Hemoglobin trending down from 17.0 on admission to 9.5 and per nursing patient has had some oozing from R groin site as well as nasal and oral secretions.  - Continue to monitor. - Trend CBC.   Fever, Leukocytosis Improving, no fevers overnight. Likely systemic inflammatory response given acute illness and cardiogenic shock. BCx with no growth at 2 days. UCx with no growth. - Continue cefepime (8/9- ). - Follow culture data.  Anterior STEMI, s/p PCI/DES - Continue aspirin, ticagrelor, and atorvastatin. - Holding beta blocker and ace inhibitor.  Renal Serum Cr down-trending from 1.72 on admission to 0.92 today (8/11). - Continue to monitor for renal injury given shock state. - Follow BMP.  Elevated Blood Glucose Blood glucose elevated to 340 on admission and hemoglobin A1c of 6.0. No known history of diabetes.  - Continue SSI.  Best practice:  Diet: NPO Pain/Anxiety/Delirium protocol (if indicated): Yes VAP protocol (if indicated): Yes DVT prophylaxis: On heparin GI prophylaxis: Pantoprazole Glucose control: SSI Mobility: Bedrest Code Status: Full code Family Communication: Plan to update patient's family Disposition: ICU  Labs   CBC: Recent Labs  Lab 03/31/19 0013  03/31/19 0503  03/31/19 1552  04/01/19 0317 04/01/19 0444 04/01/19 2204 04/02/19 0423 04/02/19 0439 04/02/19 1250   WBC 15.0*  --  16.8*   < > 14.0*  --  12.5*  --  10.3 11.7*  --  10.5  NEUTROABS 6.6  --  13.8*  --   --   --   --   --   --   --   --   --   HGB 17.0   < > 11.9*   < > 12.1*   < > 10.7* 9.9* 9.8* 10.0* 9.2* 9.5*  HCT 52.6*   < > 37.4*   < > 36.9*   < > 32.5* 29.0* 29.6* 30.0* 27.0* 29.5*  MCV 96.5  --  96.9   < > 94.1  --  93.4  --  93.1 93.5  --  94.9  PLT 388  --  314   < > 291  --  183  --  129* 128*  --  PENDING   < > = values in this interval not displayed.    Basic Metabolic Panel: Recent Labs  Lab 03/31/19 0503 03/31/19 1204 03/31/19 1552  04/01/19 0317 04/01/19 0444 04/01/19 2109 04/02/19 0423 04/02/19 0439  NA 142 140 143   < > 140 143 136 136 138  K 3.7 4.3 3.9   < > 3.6 3.6 3.5 4.3 4.3  CL 113* 112* 115*  --  111  --  107 107  --   CO2 20* 19* 20*  --  21*  --  22 21*  --   GLUCOSE 244* 187* 129*  --  159*  --  149* 161*  --   BUN 17 17 18   --  18  --  19 19  --   CREATININE 1.19 1.29* 1.29*  --  1.11  --  0.92 0.92  --   CALCIUM 6.6* 7.4* 7.6*  --  7.5*  --  7.7* 8.1*  --   MG 1.8  --  1.8  --  2.0  --  1.7 2.0  --   PHOS  --   --   --   --  2.2*  --   --  2.2*  --    < > = values in this interval not displayed.   GFR: Estimated Creatinine Clearance: 94 mL/min (by C-G formula based on SCr of 0.92 mg/dL). Recent Labs  Lab 03/31/19 0503  03/31/19 1205 03/31/19 1552 04/01/19 0317 04/01/19 1312 04/01/19 2204 04/02/19 0423 04/02/19 1250  WBC 16.8*   < >  --  14.0* 12.5*  --  10.3 11.7* 10.5  LATICACIDVEN 3.0*  --  3.5* 2.5*  --  1.4  --   --   --    < > = values in this interval not displayed.    Liver Function Tests: Recent Labs  Lab 03/31/19 0013 03/31/19 0503 04/01/19 0317 04/02/19 0423  AST 45* 55* 79* 47*  ALT 28 18 21 18   ALKPHOS 109 70 60 52  BILITOT 1.0 0.8 1.3* 0.9  PROT 7.8 4.5* 4.3* 4.9*  ALBUMIN 3.9 2.4* 2.4* 2.3*   No results for input(s): LIPASE, AMYLASE in  the last 168 hours. No results for input(s): AMMONIA in the last 168  hours.  ABG    Component Value Date/Time   PHART 7.427 04/02/2019 0439   PCO2ART 33.3 04/02/2019 0439   PO2ART 72.0 (L) 04/02/2019 0439   HCO3 21.9 04/02/2019 0439   TCO2 23 04/02/2019 0439   ACIDBASEDEF 2.0 04/02/2019 0439   O2SAT 64.7 04/02/2019 1110     Coagulation Profile: Recent Labs  Lab 03/31/19 0503  INR >10.0*    Cardiac Enzymes: No results for input(s): CKTOTAL, CKMB, CKMBINDEX, TROPONINI in the last 168 hours.  HbA1C: Hgb A1c MFr Bld  Date/Time Value Ref Range Status  03/31/2019 05:03 AM 6.0 (H) 4.8 - 5.6 % Final    Comment:    (NOTE) Pre diabetes:          5.7%-6.4% Diabetes:              >6.4% Glycemic control for   <7.0% adults with diabetes     CBG: Recent Labs  Lab 04/01/19 1943 04/01/19 2345 04/02/19 0436 04/02/19 0741 04/02/19 1109  GLUCAP 145* 158* 152* 146* 133*    Past Medical History  He,  has a past medical history of Medical history non-contributory.   Surgical History    Past Surgical History:  Procedure Laterality Date  . CORONARY/GRAFT ACUTE MI REVASCULARIZATION N/A 03/31/2019   Procedure: Coronary/Graft Acute MI Revascularization;  Surgeon: Troy Sine, MD;  Location: [facility];  Service: Cardiovascular;  Laterality: N/A;  . LEFT HEART CATH AND CORONARY ANGIOGRAPHY N/A 03/31/2019   Procedure: LEFT HEART CATH AND CORONARY ANGIOGRAPHY;  Surgeon: Troy Sine, MD;  Location: [facility];  Service: Cardiovascular;  Laterality: N/A;  . NO PAST SURGERIES    . RIGHT HEART CATH N/A 03/31/2019   Procedure: RIGHT HEART CATH;  Surgeon: Troy Sine, MD;  Location: [facility];  Service: Cardiovascular;  Laterality: N/A;  . VENTRICULAR ASSIST DEVICE INSERTION N/A 03/31/2019   Procedure: VENTRICULAR ASSIST DEVICE INSERTION;  Surgeon: Troy Sine, MD;  Location: [facility];  Service: Cardiovascular;  Laterality: N/A;     Social History   reports that he has been smoking. He has never used smokeless  tobacco. He reports that he does not drink alcohol or use drugs.   Family History   His Family history is unknown by patient.   Allergies No Known Allergies   Home Medications  Prior to Admission medications   Medication Sig Start Date End Date Taking? Authorizing Provider  Naphazoline HCl (CLEAR EYES OP) Place 1 drop into both eyes daily as needed (dry eyes/ irritation).   Yes [provider]     Atilano Ina, MS4

## 2019-04-02 NOTE — Progress Notes (Addendum)
Advanced Heart Failure Rounding Note  PCP-Cardiologist: No primary care provider on file.   Subjective:   8/9 Code - CPR,  given 1 am epi. Epi drip increased to 12  Yesterday he was given IV lasix with brisk diuresis and was later given 750 cc NS. Last night impella was turned down to P7 due to suction events. Started on amio drip 30 mg per hour.    Norepi 13 mcg & Epi 3 mcg.  Blood CX- NGTD    Impella P 7 4.7  Cardiac Power 0.7  Swan #s  41/18 CVP 9  SVR 1121    CO 4.7  CI 2.4    Objective:   Weight Range: 93.5 kg Body mass index is 30.43 kg/m.   Vital Signs:   Temp:  [98.8 F (37.1 C)-100.6 F (38.1 C)] 99 F (37.2 C) (08/11 0700) Pulse Rate:  [49-84] 64 (08/11 0700) Resp:  [0-30] 0 (08/11 0700) BP: (93-118)/(64-77) 113/65 (08/11 0447) SpO2:  [95 %-99 %] 99 % (08/11 0700) Arterial Line BP: (92-134)/(57-71) 109/63 (08/11 0700) FiO2 (%):  [40 %] 40 % (08/11 0447) Weight:  [93.5 kg] 93.5 kg (08/11 0500) Last BM Date: (PTA)  Weight change: Filed Weights   03/31/19 0008 04/01/19 0345 04/02/19 0500  Weight: 83.9 kg 92.5 kg 93.5 kg    Intake/Output:   Intake/Output Summary (Last 24 hours) at 04/02/2019 0706 Last data filed at 04/02/2019 0700 Gross per 24 hour  Intake 5950.92 ml  Output 4655 ml  Net 1295.92 ml      Physical Exam  CVP 9   General:  Intubated/sedated.   HEENT: ETT  Neck: supple. JVP 8-9 . Carotids 2+ bilat; no bruits. No lymphadenopathy or thryomegaly appreciated. Cor: PMI nondisplaced. Regular rate & rhythm. No rubs, or murmurs. + S3  Lungs: Rhonchi  Abdomen: soft, nontender, nondistended. No hepatosplenomegaly. No bruits or masses. Good bowel sounds. Extremities: R groin oozing. No cyanosis, clubbing, rash, edema. RLE immobilizer. R Foot cold. Able to doppler pulses.  Neuro:sedated    Telemetry  SR 60-70s with NSVT personally reviewed.   EKG    N/a   Labs    CBC Recent Labs    03/31/19 0013  03/31/19 0503  04/01/19  2204 04/02/19 0423 04/02/19 0439  WBC 15.0*  --  16.8*   < > 10.3 11.7*  --   NEUTROABS 6.6  --  13.8*  --   --   --   --   HGB 17.0   < > 11.9*   < > 9.8* 10.0* 9.2*  HCT 52.6*   < > 37.4*   < > 29.6* 30.0* 27.0*  MCV 96.5  --  96.9   < > 93.1 93.5  --   PLT 388  --  314   < > 129* 128*  --    < > = values in this interval not displayed.   Basic Metabolic Panel Recent Labs    04/01/19 0317  04/01/19 2109 04/02/19 0423 04/02/19 0439  NA 140   < > 136 136 138  K 3.6   < > 3.5 4.3 4.3  CL 111  --  107 107  --   CO2 21*  --  22 21*  --   GLUCOSE 159*  --  149* 161*  --   BUN 18  --  19 19  --   CREATININE 1.11  --  0.92 0.92  --   CALCIUM 7.5*  --  7.7*  8.1*  --   MG 2.0  --  1.7 2.0  --   PHOS 2.2*  --   --  2.2*  --    < > = values in this interval not displayed.   Liver Function Tests Recent Labs    04/01/19 0317 04/02/19 0423  AST 79* 47*  ALT 21 18  ALKPHOS 60 52  BILITOT 1.3* 0.9  PROT 4.3* 4.9*  ALBUMIN 2.4* 2.3*   No results for input(s): LIPASE, AMYLASE in the last 72 hours. Cardiac Enzymes No results for input(s): CKTOTAL, CKMB, CKMBINDEX, TROPONINI in the last 72 hours.  BNP: BNP (last 3 results) Recent Labs    03/31/19 0013  BNP 1,247.4*    ProBNP (last 3 results) No results for input(s): PROBNP in the last 8760 hours.   D-Dimer No results for input(s): DDIMER in the last 72 hours. Hemoglobin A1C Recent Labs    03/31/19 0503  HGBA1C 6.0*   Fasting Lipid Panel Recent Labs    03/31/19 0013  CHOL 253*  HDL 46  LDLCALC 188*  TRIG 94  CHOLHDL 5.5   Thyroid Function Tests Recent Labs    03/31/19 1205  TSH 0.887    Other results:   Imaging    Dg Chest Port 1 View  Result Date: 04/02/2019 CLINICAL DATA:  Endotracheal tube EXAM: PORTABLE CHEST 1 VIEW COMPARISON:  Yesterday FINDINGS: Endotracheal tube tip between the clavicular heads and carina. Left IJ catheter with tip at the SVC. Impella device from below with loop  overlapping the left ventricle. The orogastric tube reaches the stomach. Haziness of the chest likely from atelectasis and layering pleural fluid. Vascular congestion with improved interstitial opacity. No pneumothorax. Normal heart size. IMPRESSION: Stable, unremarkable hardware positioning. Probable improvement in pulmonary edema. Electronically Signed   By: Monte Fantasia M.D.   On: 04/02/2019 05:49     Medications:     Scheduled Medications: . aspirin  81 mg Per Tube Daily  . atorvastatin  80 mg Per Tube q1800  . chlorhexidine gluconate (MEDLINE KIT)  15 mL Mouth Rinse BID  . Chlorhexidine Gluconate Cloth  6 each Topical Daily  . digoxin  0.125 mg Per Tube Daily  . feeding supplement (PRO-STAT SUGAR FREE 64)  30 mL Per Tube TID  . insulin aspart  0-9 Units Subcutaneous Q4H  . insulin aspart  3 Units Subcutaneous Q4H  . mouth rinse  15 mL Mouth Rinse 10 times per day  . pantoprazole sodium  40 mg Per Tube Daily  . sodium chloride flush  10-40 mL Intracatheter Q12H  . sodium chloride flush  3 mL Intravenous Q12H  . spironolactone  12.5 mg Per Tube Daily  . ticagrelor  90 mg Per Tube BID    Infusions: . sodium chloride 10 mL/hr at 04/02/19 0700  . sodium chloride 10 mL/hr at 03/31/19 0700  . sodium chloride Stopped (04/01/19 0947)  . sodium chloride Stopped (04/01/19 1022)  . amiodarone 30 mg/hr (04/02/19 0700)  . ceFEPime (MAXIPIME) IV Stopped (04/01/19 1958)  . epinephrine 3 mcg/min (04/02/19 0700)  . feeding supplement (VITAL 1.5 CAL) 1,000 mL (04/01/19 1725)  . fentaNYL infusion INTRAVENOUS 300 mcg/hr (04/02/19 0700)  . impella catheter heparin 50 unit/mL in dextrose 5%    . heparin 800 Units/hr (04/02/19 0700)  . midazolam 5 mg/hr (04/02/19 0700)  . norepinephrine (LEVOPHED) Adult infusion 13 mcg/min (04/02/19 0700)  . propofol (DIPRIVAN) infusion Stopped (03/31/19 0551)    PRN Medications: sodium chloride, sodium  chloride, sodium chloride, acetaminophen,  nitroGLYCERIN, ondansetron (ZOFRAN) IV, sodium chloride flush, sodium chloride flush   *  Assessment/Plan   1. Cardiogenic shock due to acute anterior MI - s/p PCI/DES to LAD on 8/9 - Now with Impella support. P7 with CO 4.7/ CI 2.4.  - Continue NE 13 mcg /EPI 2 mcg  support.  - Hold diuresis today. CVP 9.  -  Lactic Acid trending down 3.5>2.5>1.4   - LDH trending up.   2. CAD with acute anterior MI 8/9 - s/p PCI/DES to LAD. 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 Acute hypoxic respiratory failure - in setting of #1 - Intubated. CCM managing.   4. DM2 - new diagnosis - cover with SSI  5. COPD - ongoing tobacco use - needs cessation   6. NSVT/PVC On amio drip. Continue amio drip 30 mg per hour.  K/Mag stable.   7. Anemia  8/9 Hgb 17 trending down 9.2. Oozing R groin and oral secretions.  Check CBC 1300.   Length of Stay: 2  Darrick Grinder, NP  04/02/2019, 7:06 AM  Advanced Heart Failure Team Pager [phone] (M-F; 7a - 4p)  Please contact [facility] for night-coverage after hours (4p -7a ) and weekends on [email or web address]  Agree with above  He remains intubated/sedated. On NE 14 Epi 2. Impella at P-7. Suction events have slowed. Lytes supped over night and started on amio. Ectopy improved.   RA 10 PA 42/15 PCWP 10  Thermo CO/CI  4.8/2.4 SVR 1121  Co-ox 61%  Impella P-7 flow 3.2 L/min  Exam Intubated/sedated LIJ TLC Cor RRR Impella hum  Lungs CTA Ab obese NT Ext no edema R foot slightly cool (pulses dopplerable) R groin site ok   He remains critically ill. Based on hemodynamics will not begin wean today. Start milrinone 0.125 cautiously. Try to wean NE to 10. Keep CI > 2.0. Possible impella wean in am. No diuretics. Continue broad spectrum abx for possible line sepsis.   CRITICAL CARE Performed by: [name]  Total critical care time: 45 minutes  Critical care time was  exclusive of separately billable procedures and treating other patients.  Critical care was necessary to treat or prevent imminent or life-threatening deterioration.  Critical care was time spent personally by me (independent of midlevel providers or residents) on the following activities: development of treatment plan with patient and/or surrogate as well as nursing, discussions with consultants, evaluation of patient's response to treatment, examination of patient, obtaining history from patient or surrogate, ordering and performing treatments and interventions, ordering and review of laboratory studies, ordering and review of radiographic studies, pulse oximetry and re-evaluation of patient's condition.  [name], MD  8:32 AM

## 2019-04-03 ENCOUNTER — Inpatient Hospital Stay (HOSPITAL_COMMUNITY): Payer: Self-pay

## 2019-04-03 LAB — BASIC METABOLIC PANEL WITH GFR
Anion gap: 8 (ref 5–15)
BUN: 21 mg/dL (ref 8–23)
CO2: 23 mmol/L (ref 22–32)
Calcium: 8.1 mg/dL — ABNORMAL LOW (ref 8.9–10.3)
Chloride: 104 mmol/L (ref 98–111)
Creatinine, Ser: 0.81 mg/dL (ref 0.61–1.24)
GFR calc Af Amer: >60 mL/min
GFR calc non Af Amer: >60 mL/min
Glucose, Bld: 156 mg/dL — ABNORMAL HIGH (ref 70–99)
Potassium: 4.3 mmol/L (ref 3.5–5.1)
Sodium: 135 mmol/L (ref 135–145)

## 2019-04-03 LAB — MAGNESIUM: Magnesium: 1.6 mg/dL — ABNORMAL LOW (ref 1.7–2.4)

## 2019-04-03 LAB — CBC
HCT: 26.4 % — ABNORMAL LOW (ref 39.0–52.0)
HCT: 28 % — ABNORMAL LOW (ref 39.0–52.0)
HCT: 26.4 % — ABNORMAL LOW (ref 39.0–52.0)
Hemoglobin: 8.6 g/dL — ABNORMAL LOW (ref 13.0–17.0)
Hemoglobin: 9 g/dL — ABNORMAL LOW (ref 13.0–17.0)
Hemoglobin: 8.6 g/dL — ABNORMAL LOW (ref 13.0–17.0)
MCH: 30.7 pg (ref 26.0–34.0)
MCH: 30.9 pg (ref 26.0–34.0)
MCH: 30.5 pg (ref 26.0–34.0)
MCHC: 32.6 g/dL (ref 30.0–36.0)
MCHC: 32.1 g/dL (ref 30.0–36.0)
MCHC: 32.6 g/dL (ref 30.0–36.0)
MCV: 94.3 fL (ref 80.0–100.0)
MCV: 96.2 fL (ref 80.0–100.0)
MCV: 93.6 fL (ref 80.0–100.0)
Platelets: 93 10*3/uL — ABNORMAL LOW (ref 150–400)
Platelets: 102 K/uL — ABNORMAL LOW (ref 150–400)
Platelets: 96 10*3/uL — ABNORMAL LOW (ref 150–400)
RBC: 2.8 MIL/uL — ABNORMAL LOW (ref 4.22–5.81)
RBC: 2.91 MIL/uL — ABNORMAL LOW (ref 4.22–5.81)
RBC: 2.82 MIL/uL — ABNORMAL LOW (ref 4.22–5.81)
RDW: 14.9 % (ref 11.5–15.5)
RDW: 15.1 % (ref 11.5–15.5)
RDW: 14.9 % (ref 11.5–15.5)
WBC: 8.6 10*3/uL (ref 4.0–10.5)
WBC: 9.8 K/uL (ref 4.0–10.5)
WBC: 8.9 10*3/uL (ref 4.0–10.5)
nRBC: 0 % (ref 0.0–0.2)
nRBC: 0 % (ref 0.0–0.2)
nRBC: 0 % (ref 0.0–0.2)

## 2019-04-03 LAB — COOXEMETRY PANEL
Carboxyhemoglobin: 1.4 % (ref 0.5–1.5)
Carboxyhemoglobin: 1.6 % — ABNORMAL HIGH (ref 0.5–1.5)
Carboxyhemoglobin: 1.6 % — ABNORMAL HIGH (ref 0.5–1.5)
Methemoglobin: 1.1 % (ref 0.0–1.5)
Methemoglobin: 1.4 % (ref 0.0–1.5)
Methemoglobin: 1.1 % (ref 0.0–1.5)
O2 Saturation: 58.2 %
O2 Saturation: 65.2 %
O2 Saturation: 65.8 %
Total hemoglobin: 9.8 g/dL — ABNORMAL LOW (ref 12.0–16.0)
Total hemoglobin: 8.7 g/dL — ABNORMAL LOW (ref 12.0–16.0)
Total hemoglobin: 8.4 g/dL — ABNORMAL LOW (ref 12.0–16.0)

## 2019-04-03 LAB — POCT I-STAT 7, (LYTES, BLD GAS, ICA,H+H)
Bicarbonate: 24.2 mmol/L (ref 20.0–28.0)
Calcium, Ion: 1.21 mmol/L (ref 1.15–1.40)
HCT: 24 % — ABNORMAL LOW (ref 39.0–52.0)
Hemoglobin: 8.2 g/dL — ABNORMAL LOW (ref 13.0–17.0)
O2 Saturation: 95 %
Patient temperature: 37.2
Potassium: 4.3 mmol/L (ref 3.5–5.1)
Sodium: 137 mmol/L (ref 135–145)
TCO2: 25 mmol/L (ref 22–32)
pCO2 arterial: 36 mmHg (ref 32.0–48.0)
pH, Arterial: 7.436 (ref 7.350–7.450)
pO2, Arterial: 73 mmHg — ABNORMAL LOW (ref 83.0–108.0)

## 2019-04-03 LAB — POCT ACTIVATED CLOTTING TIME
Activated Clotting Time: 169 seconds
Activated Clotting Time: 164 seconds
Activated Clotting Time: 164 seconds
Activated Clotting Time: 169 seconds
Activated Clotting Time: 164 seconds
Activated Clotting Time: 169 seconds
Activated Clotting Time: 164 seconds

## 2019-04-03 LAB — GLUCOSE, CAPILLARY
Glucose-Capillary: 145 mg/dL — ABNORMAL HIGH (ref 70–99)
Glucose-Capillary: 179 mg/dL — ABNORMAL HIGH (ref 70–99)
Glucose-Capillary: 183 mg/dL — ABNORMAL HIGH (ref 70–99)
Glucose-Capillary: 155 mg/dL — ABNORMAL HIGH (ref 70–99)
Glucose-Capillary: 71 mg/dL (ref 70–99)

## 2019-04-03 LAB — PHOSPHORUS: Phosphorus: 3.4 mg/dL (ref 2.5–4.6)

## 2019-04-03 LAB — LACTATE DEHYDROGENASE: LDH: 536 U/L — ABNORMAL HIGH (ref 98–192)

## 2019-04-03 MED ORDER — "THROMBI-PAD 3""X3"" EX PADS"
1.0000 | MEDICATED_PAD | Freq: Once | CUTANEOUS | Status: DC
  Filled 2019-04-03: qty 1

## 2019-04-03 MED ORDER — MAGNESIUM SULFATE 4 GM/100ML IV SOLN
4.0000 g | Freq: Once | INTRAVENOUS | Status: AC
  Administered 2019-04-03: 16:00:00 4 g via INTRAVENOUS
  Filled 2019-04-03: qty 100

## 2019-04-03 NOTE — Progress Notes (Signed)
ANTICOAGULATION CONSULT NOTE   Pharmacy Consult for heparin Indication: Impella  No Known Allergies  Patient Measurements: Height: 5' 9.02" (175.3 cm) Weight: 206 lb 12.7 oz (93.8 kg) IBW/kg (Calculated) : 70.74  Vital Signs: Temp: 98.1 F (36.7 C) (08/12 1030) Temp Source: Core (08/12 0800) BP: 105/73 (08/12 0800) Pulse Rate: 60 (08/12 1030)  Labs: Recent Labs    03/31/19 1204 03/31/19 1552  04/01/19 2109  04/02/19 0423  04/02/19 1250 04/03/19 0137 04/03/19 0455 04/03/19 0500  HGB 12.8* 12.1*   < >  --    < > 10.0*   < > 9.5* 8.6* 9.0* 8.2*  HCT 38.9* 36.9*   < >  --    < > 30.0*   < > 29.5* 26.4* 28.0* 24.0*  PLT 330 291   < >  --    < > 128*  --  115* 93* 102*  --   CREATININE 1.29* 1.29*   < > 0.92  --  0.92  --   --   --  0.81  --   TROPONINIHS 15,081* 17,032*  --   --   --   --   --   --   --   --   --    < > = values in this interval not displayed.    Estimated Creatinine Clearance: 106.9 mL/min (by C-G formula based on SCr of 0.81 mg/dL).   Medical History: Past Medical History:  Diagnosis Date  . Medical history non-contributory     Medications:  Scheduled:  . aspirin  81 mg Per Tube Daily  . atorvastatin  80 mg Per Tube q1800  . chlorhexidine gluconate (MEDLINE KIT)  15 mL Mouth Rinse BID  . Chlorhexidine Gluconate Cloth  6 each Topical Daily  . digoxin  0.125 mg Per Tube Daily  . feeding supplement (PRO-STAT SUGAR FREE 64)  30 mL Per Tube TID  . insulin aspart  0-9 Units Subcutaneous Q4H  . insulin aspart  3 Units Subcutaneous Q4H  . mouth rinse  15 mL Mouth Rinse 10 times per day  . pantoprazole sodium  40 mg Per Tube Daily  . sodium chloride flush  10-40 mL Intracatheter Q12H  . sodium chloride flush  3 mL Intravenous Q12H  . spironolactone  12.5 mg Per Tube Daily  . Thrombi-Pad  1 each Topical Once  . ticagrelor  90 mg Per Tube BID    Assessment: 63 yo M admitted for STEMI and taken to the cath lab. Found to have severe 3-vessel CAD  with complete occlusion of the LAD and cardiogenic shock - s/p DES to LAD and Impella CP placement.  Received bivalirudin, cangrelor, and tirofiban in the cath. These have now been discontinued.  Heparin dosing per nursing protocol. Systemic heparin currently running at 800 units/hr with ACTs in 160s. No bleeding noted, hgb with AM labs is down slightly from yesterday. Pltc 102.  Goal of Therapy:  ACT 160-180 Monitor platelets by anticoagulation protocol: Yes   Plan:  Continue heparin per ACT nomogram at bedside Follow CBC and monitor for bleeding Follow up plans for Impella pull  Vertis Kelch, PharmD PGY2 Cardiology Pharmacy Resident Phone [phone] 04/03/2019       10:37 AM  Please check [email or web address] for unit-specific pharmacist phone numbers

## 2019-04-03 NOTE — Progress Notes (Signed)
Hemodynamics stable on P-2 throughout the afternoon.   Heparin stopped.   I personally removed Impella and held manual pressure for 40 mins with good hemostasis. Fem stop applied.   [name], MD  7:05 PM

## 2019-04-03 NOTE — Progress Notes (Signed)
Bensimhon MD pulled Impella at 1820. Manual pressure held for 40 mins. VSS. Bolus of 1 mg of Versed given during procedure. Femstop applied and night shift RN instructed on deflation procedure and to keep it in place the duration of the night once deflated. CBC ordered for 2300.

## 2019-04-03 NOTE — Progress Notes (Signed)
NAME:  Stephen Jennings, MRN:  [id number], DOB:  1955-10-17, LOS: 3 ADMISSION DATE:  03/31/2019, CONSULTATION DATE:  03/31/2019 REFERRING MD:  Dr. Claiborne Billings, Cardiology, CHIEF COMPLAINT:  Acute respiratory failure  Brief History   63M with no known PMHx who presented with respiratory distress via EMS, intubated in the ED.  Code STEMI was called and patient was taken to the cath lab with complete occlusion of the LAD with cardiogenic shock.  PCCM consulted for vent management and hemodynamic management.  History of present illness   Stephen Jennings is a 63 year old gentleman with no known PMHx who presented with respiratory distress via EMS. Patient's wife described that patient had episodes of chest pain/pressure and diaphoresis in the weeks leading up to presentation. On the day of admission, he was getting ready for bed and then became acutely dyspneic, at which point EMS was called. He was intubated in the ED. A code STEMI was called and patient was taken to the cath lab with complete occlusion of the LAD and subsequently developed cardiogenic shock. Impella device was placed and he was started on dual pressor support with norepinephrine and epinephrine. PCCM consulted for vent management and hemodynamic management.  Past Medical History  No known past medical history  Significant Hospital Events   03/31/19: Intubation; Anterior STEMI with 100% LAD occlusion, s/p PCI/DES, Impella  03/31/19: Code, CPR and 1 amp epi given with immediate ROSC    Consults:  Cardiology 8/9 Heart Failure Team 8/9   Procedures:  03/31/19: ETT placed  03/31/19: PCI to LAD with DES placement. Impella placement.  03/31/19: Left IJ TLC  03/31/19: Swan-Ganz catheter  Significant Diagnostic Tests:  03/31/19 Left heart cath: Revealed 100% occlusion of LAD, 70% stenosis of RCA.  03/31/19 Echocardiogram: The left ventricle has severely reduced systolic function, with an ejection fraction of 20-25%. The cavity size was moderately dilated.  Left ventricular diastolic function could not be evaluated. Septal apical and anterior wall hyppkinesis Impella device seen in LV extending to mid ventrical CW through AV suggests low output. The right ventricle has moderately reduced systolic function. The cavity was mildly enlarged. There is no increase in right ventricular wall thickness. Left atrial size was moderately dilated. Right atrial size was mildly dilated. The aortic valve was not well visualized. Impella device seen coursing across AV. The aorta is normal in size and structure. The interatrial septum was not well visualized   Micro Data:  MRSA PCR 8/9 >> negative SARS CoV 2 8/9 >> negative Blood Cx 8/9 >> no growth to date Urine Cx 8/9 >> no growth  Antimicrobials:  Vancomycin (8/9) Cefepime (8/9- )  Interim history/subjective:  Overnight, there were no acute events. Patient was tolerating low-dose milrinone. Impella was turned down to P-6. This morning, patient has CO of 7.2 on P-6 and Impella was subsequently turned down to P-4. Patient also has a good pulse pressure. Bedside echo today (8/12) shows EF of 42%.  Fentanyl able to be weaned to 250 and midazolam weaned to 2. He remains on norepinephrine 12 and epinephrine 2.  Per nursing, patient has been moving all extremities, although not on command. He was also noted by nursing to have difficulty mobilizing secretions, although not a candidate for aggressive chest physiotherapy until Impella removed.  Objective   Blood pressure 113/62, pulse 71, temperature 98.8 F (37.1 C), resp. rate (!) 30, height 5' 9.02" (1.753 m), weight 93.8 kg, SpO2 97 %. PAP: (28-42)/(11-21) 32/14 CVP:  [8 mmHg-15 mmHg] 8 mmHg CO:  [  5.2 L/min-7.2 L/min] 7.2 L/min CI:  [2.6 L/min/m2-3.6 L/min/m2] 3.6 L/min/m2  Vent Mode: PRVC FiO2 (%):  [40 %] 40 % Set Rate:  [30 bmp] 30 bmp Vt Set:  [560 mL] 560 mL PEEP:  [5 cmH20] 5 cmH20 Plateau Pressure:  [20 cmH20-23 cmH20] 20 cmH20    Intake/Output Summary (Last 24 hours) at 04/03/2019 0730 Last data filed at 04/03/2019 0700 Gross per 24 hour  Intake 4946.09 ml  Output 2235 ml  Net 2711.09 ml   Filed Weights   04/01/19 0345 04/02/19 0500 04/03/19 0600  Weight: 92.5 kg 93.5 kg 93.8 kg    Examination: General: Intubated, sedated. HENT: ETT in place. Left IJ cath in place. Pupils equal, constricted, minimally reactive on fentanyl gtt. Lungs: Equal breath sounds bilaterally, with rhonchi noted bilaterally.  Cardiovascular: Normal rate, regular rhythm, distant heart sounds. No murmurs appreciated. Abdomen: Soft, non-distended. Positive bowel sounds. Extremities: No lower extremity edema. Impella catheter in place in right groin. Right foot is slightly cool to the touch. Pulses able to be obtained with Doppler. Neuro: Sedated. Does not follow commands. Does withdraw to noxious stimuli. GU: Foley catheter in place, draining normal appearing urine.  CXR: Lines are stable in position. Minimal bibasilar subsegmental atelectasis noted.   Resolved Hospital Problem list     Assessment & Plan:  Stephen Jennings is a 63 year old gentleman with no known PMH who presented with acute respiratory distress via EMS, intubated in the ED. Code STEMI was called and patient was taken to the cath lab with complete occlusion of the LAD, subsequently developed cardiogenic shock. He is s/p PCI with DES of the LAD and Impella placement, requiring dual vasopressor support with norepinephrine and epinephrine. PCCM consulted for vent management and hemodynamic management.  Acute Respiratory Failure - Continue PRVC, continue to hold on weaning at this time given hemodynamics and support provided by positive pressure ventilation. - Titrate FiO2 for saturation of 92-95%. - Continue to wean sedation with midazolam and fentanyl slowly. - Daily ABG to ensure he does not become excessively alkalotic. - VAP precautions. - Holding diuresis today, ideally  will diurese after Impella removal prior to extubation. - Planning on aggressive pulmonary hygiene once Impella device is removed.    Cardiogenic Shock 2/2 anterior STEMI (8/9) Patient with good cardiac output and pulse pressure, Impella support weaned to P-4. He continues on norepinephrine and epinephrine. Echo 8/9 post-procedure showed reduced LV systolic function with ERF 20-25%, septal apical and anterior wall hypokinesis; RV with moderately reduced systolic function. Bedside echo today (8/12) showed EF of 42%. - Followed by Advanced Heart Failure Team. - Continue to wean norepinephrine and epinephrine. MAP goal > 65. - Continue to wean Impella per Heart Failure Team, they are plannig to remove today (8/12) or tomorrow (8/13) - Continue milrinone, digoxin, and spironolactone. - Supplement potassium (K>4) and magnesium (Mg>2) as needed as needed. - Follow H/H.  Anemia Hemoglobin has stabilized and is  trending down from 17.0 on admission to 9.5 and per nursing patient has had some oozing from R groin site as well as nasal and oral secretions.  - Continue to monitor. - Trend CBC.   Fever, Leukocytosis Improving, remains afebrile. Likely systemic inflammatory response given acute illness and cardiogenic shock. BCx with no growth to date. UCx with no growth. - Continue cefepime (8/9- ). - Follow culture data.  Anterior STEMI (8/9) s/p PCI/DES - Continue aspirin, ticagrelor, and atorvastatin. - Holding beta blocker and ace inhibitor.  Non-sustained VT/PVC Improving with amiodarone.  -  Continue amiodarone gtt. - Maintain potassium > 4.0 and Mag > 2.0. - Will supplement magnesium today.  Renal Serum Cr continues to trend down. 1.72 on admission to 0.81 today (8/11). - Continue to monitor for renal injury given shock state. - Follow BMP.  Thrombocytopenia Platelets decreased to 102, likely in setting of mechanical destruction due to Impella device. - Continue to monitor.    Elevated Blood Glucose Blood glucose elevated to 340 on admission and hemoglobin A1c of 6.0. No known history of diabetes.  - Continue SSI.  Best practice:  Diet: NPO Pain/Anxiety/Delirium protocol (if indicated): Yes VAP protocol (if indicated): Yes DVT prophylaxis: On heparin GI prophylaxis: Pantoprazole Glucose control: SSI Mobility: Bedrest Code Status: Full code Family Communication: Plan to update patient's family Disposition: ICU  Labs   CBC: Recent Labs  Lab 03/31/19 0013  03/31/19 0503  04/01/19 2204 04/02/19 0423 04/02/19 0439 04/02/19 1250 04/03/19 0137 04/03/19 0455 04/03/19 0500  WBC 15.0*  --  16.8*   < > 10.3 11.7*  --  10.5 8.6 9.8  --   NEUTROABS 6.6  --  13.8*  --   --   --   --   --   --   --   --   HGB 17.0   < > 11.9*   < > 9.8* 10.0* 9.2* 9.5* 8.6* 9.0* 8.2*  HCT 52.6*   < > 37.4*   < > 29.6* 30.0* 27.0* 29.5* 26.4* 28.0* 24.0*  MCV 96.5  --  96.9   < > 93.1 93.5  --  94.9 94.3 96.2  --   PLT 388  --  314   < > 129* 128*  --  115* 93* 102*  --    < > = values in this interval not displayed.    Basic Metabolic Panel: Recent Labs  Lab 03/31/19 1552  04/01/19 0317  04/01/19 2109 04/02/19 0423 04/02/19 0439 04/03/19 0455 04/03/19 0500  NA 143   < > 140   < > 136 136 138 135 137  K 3.9   < > 3.6   < > 3.5 4.3 4.3 4.3 4.3  CL 115*  --  111  --  107 107  --  104  --   CO2 20*  --  21*  --  22 21*  --  23  --   GLUCOSE 129*  --  159*  --  149* 161*  --  156*  --   BUN 18  --  18  --  19 19  --  21  --   CREATININE 1.29*  --  1.11  --  0.92 0.92  --  0.81  --   CALCIUM 7.6*  --  7.5*  --  7.7* 8.1*  --  8.1*  --   MG 1.8  --  2.0  --  1.7 2.0  --  1.6*  --   PHOS  --   --  2.2*  --   --  2.2*  --  3.4  --    < > = values in this interval not displayed.   GFR: Estimated Creatinine Clearance: 106.9 mL/min (by C-G formula based on SCr of 0.81 mg/dL). Recent Labs  Lab 03/31/19 0503  03/31/19 1205 03/31/19 1552  04/01/19 1312  04/02/19 0423  04/02/19 1250 04/03/19 0137 04/03/19 0455  WBC 16.8*   < >  --  14.0*   < >  --    < > 11.7* 10.5  8.6 9.8  LATICACIDVEN 3.0*  --  3.5* 2.5*  --  1.4  --   --   --   --   --    < > = values in this interval not displayed.    Liver Function Tests: Recent Labs  Lab 03/31/19 0013 03/31/19 0503 04/01/19 0317 04/02/19 0423  AST 45* 55* 79* 47*  ALT 28 18 21 18   ALKPHOS 109 70 60 52  BILITOT 1.0 0.8 1.3* 0.9  PROT 7.8 4.5* 4.3* 4.9*  ALBUMIN 3.9 2.4* 2.4* 2.3*   No results for input(s): LIPASE, AMYLASE in the last 168 hours. No results for input(s): AMMONIA in the last 168 hours.  ABG    Component Value Date/Time   PHART 7.436 04/03/2019 0500   PCO2ART 36.0 04/03/2019 0500   PO2ART 73.0 (L) 04/03/2019 0500   HCO3 24.2 04/03/2019 0500   TCO2 25 04/03/2019 0500   ACIDBASEDEF 2.0 04/02/2019 0439   O2SAT 58.2 04/03/2019 0618     Coagulation Profile: Recent Labs  Lab 03/31/19 0503  INR >10.0*    Cardiac Enzymes: No results for input(s): CKTOTAL, CKMB, CKMBINDEX, TROPONINI in the last 168 hours.  HbA1C: Hgb A1c MFr Bld  Date/Time Value Ref Range Status  03/31/2019 05:03 AM 6.0 (H) 4.8 - 5.6 % Final    Comment:    (NOTE) Pre diabetes:          5.7%-6.4% Diabetes:              >6.4% Glycemic control for   <7.0% adults with diabetes     CBG: Recent Labs  Lab 04/02/19 1109 04/02/19 1511 04/02/19 2008 04/02/19 2346 04/03/19 0457  GLUCAP 133* 145* 126* 172* 145*    Past Medical History  He,  has a past medical history of Medical history non-contributory.   Surgical History    Past Surgical History:  Procedure Laterality Date  . CORONARY/GRAFT ACUTE MI REVASCULARIZATION N/A 03/31/2019   Procedure: Coronary/Graft Acute MI Revascularization;  Surgeon: Troy Sine, MD;  Location: [place] [facility];  Service: Cardiovascular;  Laterality: N/A;  . LEFT HEART CATH AND CORONARY ANGIOGRAPHY N/A 03/31/2019   Procedure: LEFT HEART CATH AND CORONARY ANGIOGRAPHY;   Surgeon: Troy Sine, MD;  Location: [place] [facility];  Service: Cardiovascular;  Laterality: N/A;  . NO PAST SURGERIES    . RIGHT HEART CATH N/A 03/31/2019   Procedure: RIGHT HEART CATH;  Surgeon: Troy Sine, MD;  Location: [place] [facility];  Service: Cardiovascular;  Laterality: N/A;  . VENTRICULAR ASSIST DEVICE INSERTION N/A 03/31/2019   Procedure: VENTRICULAR ASSIST DEVICE INSERTION;  Surgeon: Troy Sine, MD;  Location: [place] [facility];  Service: Cardiovascular;  Laterality: N/A;     Social History   reports that he has been smoking. He has never used smokeless tobacco. He reports that he does not drink alcohol or use drugs.   Family History   His Family history is unknown by patient.   Allergies No Known Allergies   Home Medications  Prior to Admission medications   Medication Sig Start Date End Date Taking? Authorizing Provider  Naphazoline HCl (CLEAR EYES OP) Place 1 drop into both eyes daily as needed (dry eyes/ irritation).   Yes [provider]     Atilano Ina, MS4

## 2019-04-03 NOTE — Progress Notes (Signed)
Impella dropped to P2 by Bensimhon MD. Orders to do swan numbers and get a co-ox at 1400.

## 2019-04-03 NOTE — Progress Notes (Signed)
Advanced Heart Failure Rounding Note  PCP-Cardiologist: No primary care provider on file.   Subjective:     Impella remains in. I turned down to P-6 last night. Flows at 3L. Waveforms ok . On NE 13 and epi 3 and milrinone 0.125  Swan #s done personally with RN CVP 9  PA 38/14 PCWP 12 SVR 663 CO 6.6/3.3   Objective:   Weight Range: 93.8 kg Body mass index is 30.52 kg/m.   Vital Signs:   Temp:  [98.4 F (36.9 C)-99.1 F (37.3 C)] 98.8 F (37.1 C) (08/12 0745) Pulse Rate:  [60-77] 67 (08/12 0745) Resp:  [0-30] 25 (08/12 0745) BP: (91-113)/(59-67) 96/64 (08/12 0730) SpO2:  [94 %-100 %] 95 % (08/12 0745) Arterial Line BP: (100-138)/(49-72) 116/57 (08/12 0745) FiO2 (%):  [40 %] 40 % (08/12 0421) Weight:  [93.8 kg] 93.8 kg (08/12 0600) Last BM Date: (PTA)  Weight change: Filed Weights   04/01/19 0345 04/02/19 0500 04/03/19 0600  Weight: 92.5 kg 93.5 kg 93.8 kg    Intake/Output:   Intake/Output Summary (Last 24 hours) at 04/03/2019 0758 Last data filed at 04/03/2019 0700 Gross per 24 hour  Intake 4946.09 ml  Output 2235 ml  Net 2711.09 ml      Physical Exam  CVP 9   General:  Intubated/sedated.   HEENT: ETT  Neck: supple. JVP 7 LIJ TLC Carotids 2+ bilat; no bruits. No lymphadenopathy or thryomegaly appreciated. Cor: PMI nondisplaced. Regular rate & rhythm. No rubs, gallops or murmurs. Impella hum Lungs: diffuse rhonchi Abdomen: obese soft, nontender, nondistended. No hepatosplenomegaly. No bruits or masses. Good bowel sounds. Extremities: no cyanosis, clubbing, rash, edema R foot cool but dopplerable pulses. RFA site ok mild ooze Neuro: intubated/sedated    Telemetry  SR 60-70s with NSVT personally reviewed.   EKG    N/a   Labs    CBC Recent Labs    04/03/19 0137 04/03/19 0455 04/03/19 0500  WBC 8.6 9.8  --   HGB 8.6* 9.0* 8.2*  HCT 26.4* 28.0* 24.0*  MCV 94.3 96.2  --   PLT 93* 102*  --    Basic Metabolic Panel Recent Labs   04/02/19 0423  04/03/19 0455 04/03/19 0500  NA 136   < > 135 137  K 4.3   < > 4.3 4.3  CL 107  --  104  --   CO2 21*  --  23  --   GLUCOSE 161*  --  156*  --   BUN 19  --  21  --   CREATININE 0.92  --  0.81  --   CALCIUM 8.1*  --  8.1*  --   MG 2.0  --  1.6*  --   PHOS 2.2*  --  3.4  --    < > = values in this interval not displayed.   Liver Function Tests Recent Labs    04/01/19 0317 04/02/19 0423  AST 79* 47*  ALT 21 18  ALKPHOS 60 52  BILITOT 1.3* 0.9  PROT 4.3* 4.9*  ALBUMIN 2.4* 2.3*   No results for input(s): LIPASE, AMYLASE in the last 72 hours. Cardiac Enzymes No results for input(s): CKTOTAL, CKMB, CKMBINDEX, TROPONINI in the last 72 hours.  BNP: BNP (last 3 results) Recent Labs    03/31/19 0013  BNP 1,247.4*    ProBNP (last 3 results) No results for input(s): PROBNP in the last 8760 hours.   D-Dimer No results for input(s): DDIMER in the  last 72 hours. Hemoglobin A1C No results for input(s): HGBA1C in the last 72 hours. Fasting Lipid Panel No results for input(s): CHOL, HDL, LDLCALC, TRIG, CHOLHDL, LDLDIRECT in the last 72 hours. Thyroid Function Tests Recent Labs    03/31/19 1205  TSH 0.887    Other results:   Imaging    No results found.   Medications:     Scheduled Medications: . aspirin  81 mg Per Tube Daily  . atorvastatin  80 mg Per Tube q1800  . chlorhexidine gluconate (MEDLINE KIT)  15 mL Mouth Rinse BID  . Chlorhexidine Gluconate Cloth  6 each Topical Daily  . digoxin  0.125 mg Per Tube Daily  . feeding supplement (PRO-STAT SUGAR FREE 64)  30 mL Per Tube TID  . insulin aspart  0-9 Units Subcutaneous Q4H  . insulin aspart  3 Units Subcutaneous Q4H  . mouth rinse  15 mL Mouth Rinse 10 times per day  . pantoprazole sodium  40 mg Per Tube Daily  . sodium chloride flush  10-40 mL Intracatheter Q12H  . sodium chloride flush  3 mL Intravenous Q12H  . spironolactone  12.5 mg Per Tube Daily  . ticagrelor  90 mg Per Tube BID     Infusions: . sodium chloride 10 mL/hr at 04/03/19 0700  . sodium chloride 10 mL/hr at 04/02/19 1500  . sodium chloride Stopped (04/01/19 0947)  . sodium chloride Stopped (04/01/19 1022)  . amiodarone 30 mg/hr (04/03/19 0700)  . ceFEPime (MAXIPIME) IV 2 g (04/03/19 0738)  . epinephrine 2 mcg/min (04/03/19 0700)  . feeding supplement (VITAL 1.5 CAL) 1,000 mL (04/02/19 1610)  . fentaNYL infusion INTRAVENOUS 250 mcg/hr (04/03/19 0700)  . impella catheter heparin 50 unit/mL in dextrose 5%    . heparin 800 Units/hr (04/03/19 0700)  . midazolam 2 mg/hr (04/03/19 0700)  . milrinone 0.125 mcg/kg/min (04/03/19 0700)  . norepinephrine (LEVOPHED) Adult infusion 10 mcg/min (04/03/19 0700)    PRN Medications: sodium chloride, sodium chloride, sodium chloride, acetaminophen, fentaNYL, nitroGLYCERIN, ondansetron (ZOFRAN) IV, sodium chloride flush, sodium chloride flush   *  Assessment/Plan   1. Cardiogenic shock due to acute anterior MI - s/p PCI/DES to LAD on 8/9 - Now with Impella support. P6 with CO 6.6/3.3 but co-ox down to 58% - Continue NE 13 mcg /EPI 2 mcg  support.  - Hold diuresis today. CVP 9.  - Will turn Impella down to P-4 and reassess co-ox and hemodynamics in 2 hours to see if we can continue to wean . Would like to remove today or tomorrow. Continue heparin. Discussed dosing with PharmD personally.   2. CAD with acute anterior MI 8/9 - s/p PCI/DES to LAD. 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 Acute hypoxic respiratory failure - in setting of #1 - Intubated. CCM managing.   4. DM2 - new diagnosis - cover with SSI  5. COPD - ongoing tobacco use - needs cessation   6. NSVT/PVC - Improved on amio drip. Continue amio drip 30 mg per hour.  - Keep K> 4.0 Mg > 2.0  7. Anemia  - hgb stable today at 9.8   CRITICAL CARE Performed by: [name]  Total  critical care time: 35 minutes  Critical care time was exclusive of separately billable procedures and treating other patients.  Critical care was necessary to treat or prevent imminent or life-threatening deterioration.  Critical care was time spent  personally by me (independent of midlevel providers or residents) on the following activities: development of treatment plan with patient and/or surrogate as well as nursing, discussions with consultants, evaluation of patient's response to treatment, examination of patient, obtaining history from patient or surrogate, ordering and performing treatments and interventions, ordering and review of laboratory studies, ordering and review of radiographic studies, pulse oximetry and re-evaluation of patient's condition.    Length of Stay: 3  [name], MD  04/03/2019, 7:58 AM  Advanced Heart Failure Team Pager [phone] (M-F; 7a - 4p)  Please contact [facility] for night-coverage after hours (4p -7a ) and weekends on [email or web address]

## 2019-04-03 NOTE — Progress Notes (Signed)
Bensimhon MD notified of swan numbers and co-ox at 1100. Ordered to turn impella to P3.

## 2019-04-03 NOTE — Progress Notes (Signed)
Bensimhon MD at bedside. Turned down impella to P4. Orders for co-ox and full set of PA numbers at 1100 and to notify him with results.

## 2019-04-04 ENCOUNTER — Encounter (HOSPITAL_COMMUNITY): Payer: Self-pay | Admitting: Cardiovascular Disease

## 2019-04-04 ENCOUNTER — Inpatient Hospital Stay (HOSPITAL_COMMUNITY): Payer: Self-pay

## 2019-04-04 DIAGNOSIS — J810 Acute pulmonary edema: Secondary | ICD-10-CM

## 2019-04-04 DIAGNOSIS — R7309 Other abnormal glucose: Secondary | ICD-10-CM

## 2019-04-04 LAB — COOXEMETRY PANEL
Carboxyhemoglobin: 0.9 % (ref 0.5–1.5)
Methemoglobin: 0.8 % (ref 0.0–1.5)
O2 Saturation: 55 %
Total hemoglobin: 9 g/dL — ABNORMAL LOW (ref 12.0–16.0)

## 2019-04-04 LAB — BASIC METABOLIC PANEL
Anion gap: 8 (ref 5–15)
BUN: 23 mg/dL (ref 8–23)
CO2: 24 mmol/L (ref 22–32)
Calcium: 7.8 mg/dL — ABNORMAL LOW (ref 8.9–10.3)
Chloride: 103 mmol/L (ref 98–111)
Creatinine, Ser: 0.83 mg/dL (ref 0.61–1.24)
GFR calc Af Amer: >60 mL/min (ref >60)
GFR calc non Af Amer: >60 mL/min (ref >60)
Glucose, Bld: 208 mg/dL — ABNORMAL HIGH (ref 70–99)
Potassium: 4.5 mmol/L (ref 3.5–5.1)
Sodium: 135 mmol/L (ref 135–145)

## 2019-04-04 LAB — CBC
HCT: 25 % — ABNORMAL LOW (ref 39.0–52.0)
HCT: 25.4 % — ABNORMAL LOW (ref 39.0–52.0)
HCT: 26.2 % — ABNORMAL LOW (ref 39.0–52.0)
Hemoglobin: 8.1 g/dL — ABNORMAL LOW (ref 13.0–17.0)
Hemoglobin: 8.2 g/dL — ABNORMAL LOW (ref 13.0–17.0)
Hemoglobin: 8.3 g/dL — ABNORMAL LOW (ref 13.0–17.0)
MCH: 30.3 pg (ref 26.0–34.0)
MCH: 30.5 pg (ref 26.0–34.0)
MCH: 30.7 pg (ref 26.0–34.0)
MCHC: 31.7 g/dL (ref 30.0–36.0)
MCHC: 32.3 g/dL (ref 30.0–36.0)
MCHC: 32.4 g/dL (ref 30.0–36.0)
MCV: 94.4 fL (ref 80.0–100.0)
MCV: 94.7 fL (ref 80.0–100.0)
MCV: 95.6 fL (ref 80.0–100.0)
Platelets: 106 10*3/uL — ABNORMAL LOW (ref 150–400)
Platelets: 113 10*3/uL — ABNORMAL LOW (ref 150–400)
Platelets: 99 10*3/uL — ABNORMAL LOW (ref 150–400)
RBC: 2.64 MIL/uL — ABNORMAL LOW (ref 4.22–5.81)
RBC: 2.69 MIL/uL — ABNORMAL LOW (ref 4.22–5.81)
RBC: 2.74 MIL/uL — ABNORMAL LOW (ref 4.22–5.81)
RDW: 15.1 % (ref 11.5–15.5)
RDW: 15.2 % (ref 11.5–15.5)
RDW: 15.3 % (ref 11.5–15.5)
WBC: 7.1 10*3/uL (ref 4.0–10.5)
WBC: 7.2 10*3/uL (ref 4.0–10.5)
WBC: 7.3 10*3/uL (ref 4.0–10.5)
nRBC: 0 % (ref 0.0–0.2)
nRBC: 0 % (ref 0.0–0.2)
nRBC: 0 % (ref 0.0–0.2)

## 2019-04-04 LAB — GLUCOSE, CAPILLARY
Glucose-Capillary: 111 mg/dL — ABNORMAL HIGH (ref 70–99)
Glucose-Capillary: 131 mg/dL — ABNORMAL HIGH (ref 70–99)
Glucose-Capillary: 149 mg/dL — ABNORMAL HIGH (ref 70–99)
Glucose-Capillary: 157 mg/dL — ABNORMAL HIGH (ref 70–99)
Glucose-Capillary: 181 mg/dL — ABNORMAL HIGH (ref 70–99)
Glucose-Capillary: 185 mg/dL — ABNORMAL HIGH (ref 70–99)

## 2019-04-04 LAB — MAGNESIUM: Magnesium: 1.9 mg/dL (ref 1.7–2.4)

## 2019-04-04 LAB — PROCALCITONIN: Procalcitonin: 0.14 ng/mL

## 2019-04-04 LAB — LACTATE DEHYDROGENASE: LDH: 381 U/L — ABNORMAL HIGH (ref 98–192)

## 2019-04-04 MED ORDER — MIDAZOLAM HCL 2 MG/2ML IJ SOLN
2.0000 mg | Freq: Once | INTRAMUSCULAR | Status: AC
  Administered 2019-04-05: 2 mg via INTRAVENOUS

## 2019-04-04 MED ORDER — DEXMEDETOMIDINE HCL IN NACL 200 MCG/50ML IV SOLN
0.0000 ug/kg/h | INTRAVENOUS | Status: DC
  Administered 2019-04-05: 0.4 ug/kg/h via INTRAVENOUS
  Administered 2019-04-05: 06:00:00 1.2 ug/kg/h via INTRAVENOUS
  Administered 2019-04-05: 0.5 ug/kg/h via INTRAVENOUS
  Filled 2019-04-04: qty 100

## 2019-04-04 MED ORDER — INSULIN GLARGINE 100 UNIT/ML ~~LOC~~ SOLN
15.0000 [IU] | Freq: Every day | SUBCUTANEOUS | Status: DC
  Administered 2019-04-04 – 2019-04-16 (×13): 15 [IU] via SUBCUTANEOUS
  Filled 2019-04-04 (×13): qty 0.15

## 2019-04-04 MED ORDER — MAGNESIUM SULFATE IN D5W 1-5 GM/100ML-% IV SOLN
1.0000 g | Freq: Once | INTRAVENOUS | Status: AC
  Administered 2019-04-04: 13:00:00 1 g via INTRAVENOUS
  Filled 2019-04-04: qty 100

## 2019-04-04 MED ORDER — FUROSEMIDE 10 MG/ML IJ SOLN
20.0000 mg | Freq: Once | INTRAMUSCULAR | Status: AC
  Administered 2019-04-04: 08:00:00 20 mg via INTRAVENOUS
  Filled 2019-04-04: qty 2

## 2019-04-04 MED ORDER — HEPARIN SODIUM (PORCINE) 5000 UNIT/ML IJ SOLN
5000.0000 [IU] | Freq: Three times a day (TID) | INTRAMUSCULAR | Status: DC
  Administered 2019-04-04 – 2019-04-16 (×34): 5000 [IU] via SUBCUTANEOUS
  Filled 2019-04-04 (×31): qty 1

## 2019-04-04 NOTE — Progress Notes (Signed)
Advanced Heart Failure Rounding Note  PCP-Cardiologist: No primary care provider on file.   Subjective:    8/12 Impella removed.   Currently on Norepi 2 + Epi 2 + Milrinone 0.125 mcg.   Swan #s from 4 am CO-OX 55%.  CVP 13 PA 28/24 PCWP 24 CO 6.9/3.5    Objective:   Weight Range: 92.6 kg Body mass index is 30.13 kg/m.   Vital Signs:   Temp:  [97.9 F (36.6 C)-99 F (37.2 C)] 98.4 F (36.9 C) (08/13 0700) Pulse Rate:  [56-76] 61 (08/13 0700) Resp:  [13-31] 30 (08/13 0700) BP: (100-141)/(42-73) 114/44 (08/13 0428) SpO2:  [92 %-100 %] 100 % (08/13 0700) Arterial Line BP: (104-153)/(36-57) 107/36 (08/13 0700) FiO2 (%):  [40 %-60 %] 50 % (08/13 0428) Weight:  [92.6 kg] 92.6 kg (08/13 0500) Last BM Date: (PTA)  Weight change: Filed Weights   04/02/19 0500 04/03/19 0600 04/04/19 0500  Weight: 93.5 kg 93.8 kg 92.6 kg    Intake/Output:   Intake/Output Summary (Last 24 hours) at 04/04/2019 0738 Last data filed at 04/04/2019 0700 Gross per 24 hour  Intake 3789.41 ml  Output 3090 ml  Net 699.41 ml      Physical Exam  CVP 13-14 General:  Intubated/Sedated  HEENT: ETT Neck: supple. JVD 13-14 . Carotids 2+ bilat; no bruits. No lymphadenopathy or thryomegaly appreciated. Cor: PMI nondisplaced. Regular rate & rhythm. No rubs, gallops or murmurs. Lungs: Rhonchi throughout Abdomen: soft, nontender, nondistended. No hepatosplenomegaly. No bruits or masses. Good bowel sounds. Extremities: warm , no cyanosis, clubbing, rash, edema Neuro: Intubated/sedated GU: foley yellow urine.     Telemetry  SR 60-70s PVCs personally reviewed.  EKG    N/a   Labs    CBC Recent Labs    04/04/19 0012 04/04/19 0429  WBC 7.3 7.2  HGB 8.1* 8.2*  HCT 25.0* 25.4*  MCV 94.7 94.4  PLT 99* 588*   Basic Metabolic Panel Recent Labs    04/02/19 0423  04/03/19 0455 04/03/19 0500 04/04/19 0406  NA 136   < > 135 137 135  K 4.3   < > 4.3 4.3 4.5  CL 107  --  104  --   103  CO2 21*  --  23  --  24  GLUCOSE 161*  --  156*  --  208*  BUN 19  --  21  --  23  CREATININE 0.92  --  0.81  --  0.83  CALCIUM 8.1*  --  8.1*  --  7.8*  MG 2.0  --  1.6*  --   --   PHOS 2.2*  --  3.4  --   --    < > = values in this interval not displayed.   Liver Function Tests Recent Labs    04/02/19 0423  AST 47*  ALT 18  ALKPHOS 52  BILITOT 0.9  PROT 4.9*  ALBUMIN 2.3*   No results for input(s): LIPASE, AMYLASE in the last 72 hours. Cardiac Enzymes No results for input(s): CKTOTAL, CKMB, CKMBINDEX, TROPONINI in the last 72 hours.  BNP: BNP (last 3 results) Recent Labs    03/31/19 0013  BNP 1,247.4*    ProBNP (last 3 results) No results for input(s): PROBNP in the last 8760 hours.   D-Dimer No results for input(s): DDIMER in the last 72 hours. Hemoglobin A1C No results for input(s): HGBA1C in the last 72 hours. Fasting Lipid Panel No results for input(s): CHOL, HDL, LDLCALC,  TRIG, CHOLHDL, LDLDIRECT in the last 72 hours. Thyroid Function Tests No results for input(s): TSH, T4TOTAL, T3FREE, THYROIDAB in the last 72 hours.  Invalid input(s): FREET3  Other results:   Imaging    No results found.   Medications:     Scheduled Medications: . aspirin  81 mg Per Tube Daily  . atorvastatin  80 mg Per Tube q1800  . chlorhexidine gluconate (MEDLINE KIT)  15 mL Mouth Rinse BID  . Chlorhexidine Gluconate Cloth  6 each Topical Daily  . digoxin  0.125 mg Per Tube Daily  . feeding supplement (PRO-STAT SUGAR FREE 64)  30 mL Per Tube TID  . furosemide  20 mg Intravenous Once  . insulin aspart  0-9 Units Subcutaneous Q4H  . insulin aspart  3 Units Subcutaneous Q4H  . mouth rinse  15 mL Mouth Rinse 10 times per day  . pantoprazole sodium  40 mg Per Tube Daily  . sodium chloride flush  10-40 mL Intracatheter Q12H  . sodium chloride flush  3 mL Intravenous Q12H  . spironolactone  12.5 mg Per Tube Daily  . Thrombi-Pad  1 each Topical Once  . ticagrelor   90 mg Per Tube BID    Infusions: . sodium chloride 10 mL/hr at 04/04/19 0700  . sodium chloride 10 mL/hr at 04/02/19 1500  . sodium chloride Stopped (04/01/19 0947)  . sodium chloride Stopped (04/01/19 1022)  . amiodarone 30 mg/hr (04/04/19 0700)  . ceFEPime (MAXIPIME) IV Stopped (04/03/19 2037)  . epinephrine 2 mcg/min (04/04/19 0700)  . feeding supplement (VITAL 1.5 CAL) 55 mL/hr at 04/04/19 0600  . fentaNYL infusion INTRAVENOUS 175 mcg/hr (04/04/19 0700)  . impella catheter heparin 50 unit/mL in dextrose 5%    . heparin Stopped (04/03/19 1653)  . midazolam 1 mg/hr (04/04/19 0700)  . milrinone 0.125 mcg/kg/min (04/04/19 0700)  . norepinephrine (LEVOPHED) Adult infusion 2 mcg/min (04/04/19 0700)    PRN Medications: sodium chloride, sodium chloride, sodium chloride, acetaminophen, fentaNYL, nitroGLYCERIN, ondansetron (ZOFRAN) IV, sodium chloride flush, sodium chloride flush   *  Assessment/Plan   1. Cardiogenic shock due to acute anterior MI - s/p PCI/DES to LAD on 8/9 - Impella removed 8/12. CO-OX 55%. CO 7.4/CI 3.7  - Continue NE 2 mcg. Should be able to come off epi on milrinone 0.125 mcg.  - CVP trending up to 13-14. Give 20 mg IV lasix x1. .   2. CAD with acute anterior MI 8/9 - s/p PCI/DES to LAD. 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 Acute hypoxic respiratory failure - in setting of #1 - Intubated. CCM managing.   4. DM2 - new diagnosis - cover with SSI  5. COPD - ongoing tobacco use - needs cessation   6. NSVT/PVC - Improved on amio drip. Continue amio drip 30 mg per hour.  - Keep K> 4.0 Mg > 2.0 - K ok. Check Mag now.   7. Anemia  - hgb stable today at 9.8>8.2 . No obvious source of bleeding.     Length of Stay: 4  Amy Clegg, NP  04/04/2019, 7:38 AM  Advanced Heart Failure Team Pager [phone] (M-F; 7a - 4p)  Please contact [facility] for  night-coverage after hours (4p -7a ) and weekends on [email or web address]

## 2019-04-04 NOTE — Progress Notes (Signed)
Nutrition Follow-up  DOCUMENTATION CODES:   Not applicable  INTERVENTION:   -Monitor for BM (day 4 without)  Tube feeding:  -Vital 1.5 @ 30 ml/hr via OG tube -Increase per toleration to goal rate of 50 ml/hr (1200 ml) -Pro-stat 30 ml TID  At goal tube feeding regimen provides 2100 kcal, 126 grams of protein, and 917 ml of H2O.  NUTRITION DIAGNOSIS:   Inadequate oral intake related to inability to eat as evidenced by NPO status.  Ongoing  GOAL:   Patient will meet greater than or equal to 90% of their needs  Addressed via TF  MONITOR:   Labs, Weight trends, TF tolerance, I & O's  REASON FOR ASSESSMENT:   Ventilator, Consult Enteral/tube feeding initiation and management  ASSESSMENT:   63 year old male who presented to the ED on 8/09 with severe respiratory distress. PMH of COPD. Pt required intubation in the ED. Pt found to have STEMI. CXR showing pulmonary edema. Pt brought to the cath lab. Angiography demonstrated an occluded proximal LAD which was successfully stented. Pt now with cardiogenic shock. Impella placed.   8/9 - PCI with a DES and Impella placement, code/CPR 8/12- Impella removed   RD working remotely.  Pt too sedated for SBT today. Requiring pressors. Lasix started this am. Spoke with RN who reports pt had increased tan like secretions in mouth/ET tube last night. Vital 1.5 turned down to 20 ml/hr. Pt tolerating. Increase to 30 ml/hr today with plan to increase to goal tomorrow if pt is unable to extubate.   Admission weight: 83.9 Current weight: 92.6 kg   Patient remains intubated on ventilator support MV: 16.6 L/min Temp (24hrs), Avg:98.4 F (36.9 C), Min:97.9 F (36.6 C), Max:99.3 F (37.4 C)    I/O: +6,223 ml since admit UOP: 3,090 ml x 24 hrs   Drips: amiodarone, milrinone, levophed  Medications: SS novolog, lantus, aldactone Labs: CBG 149-185   Diet Order:   Diet Order            Diet NPO time specified  Diet effective now               EDUCATION NEEDS:   No education needs have been identified at this time  Skin:  Skin Assessment: Reviewed RN Assessment  Last BM:  PTA  Height:   Ht Readings from Last 1 Encounters:  03/31/19 5' 9.02" (1.753 m)    Weight:   Wt Readings from Last 1 Encounters:  04/04/19 92.6 kg    Ideal Body Weight:  72.7 kg  BMI:  Body mass index is 30.13 kg/m.  Estimated Nutritional Needs:   Kcal:  2029 kcal  Protein:  120-135 grams  Fluid:  >/= 2.0 L   Mariana Single RD, LDN Clinical Nutrition Pager # [phone]

## 2019-04-04 NOTE — Progress Notes (Signed)
NAME:  Stephen Jennings, MRN:  [id number], DOB:  [birth year], LOS: 4 ADMISSION DATE:  03/31/2019, CONSULTATION DATE:  03/31/2019 REFERRING MD:  Dr. Claiborne Billings, Cardiology, CHIEF COMPLAINT:  Acute respiratory failure, cardiogenic shock  Brief History   63M with no known PMHx who presented with respiratory distress via EMS, intubated in the ED.  Code STEMI was called and patient was taken to the cath lab with complete occlusion of the LAD with cardiogenic shock.  PCCM consulted for vent management and hemodynamic management.  History of present illness   Stephen Jennings is a 63 year old gentleman with no known PMHx who presented with respiratory distress via EMS. Patient's wife described that patient had episodes of chest pain/pressure and diaphoresis in the weeks leading up to presentation. On the day of admission, he was getting ready for bed and then became acutely dyspneic, at which point EMS was called. He was intubated in the ED. A code STEMI was called and patient was taken to the cath lab with complete occlusion of the LAD and subsequently developed cardiogenic shock. Impella device was placed and he was started on dual pressor support with norepinephrine and epinephrine. PCCM consulted for vent management and hemodynamic management.  Past Medical History  No known past medical history  Significant Hospital Events   03/31/19: Intubation; Anterior STEMI with 100% LAD occlusion, s/p PCI/DES, Impella  03/31/19: Code, CPR and 1 amp epi given with immediate ROSC   04/03/19: Impella removed  Consults:  Cardiology Heart Failure Team PCCM  Procedures:  03/31/19: ETT placed  03/31/19: PCI to LAD with DES placement.   Impella 03/31/19 >> 04/03/19  03/31/19: Left IJ TLC  03/31/19: Swan-Ganz catheter  Significant Diagnostic Tests:  03/31/19 Left heart cath: Revealed 100% occlusion of LAD, 70% stenosis of RCA.  03/31/19 Echocardiogram: The left ventricle has severely reduced systolic function, with an ejection fraction  of 20-25%. The cavity size was moderately dilated. Left ventricular diastolic function could not be evaluated. Septal apical and anterior wall hyppkinesis Impella device seen in LV extending to mid ventrical CW through AV suggests low output. The right ventricle has moderately reduced systolic function. The cavity was mildly enlarged. There is no increase in right ventricular wall thickness. Left atrial size was moderately dilated. Right atrial size was mildly dilated. The aortic valve was not well visualized. Impella device seen coursing across AV. The aorta is normal in size and structure. The interatrial septum was not well visualized   Micro Data:  MRSA PCR 8/9 >> negative SARS CoV 2 8/9 >> negative Blood Cx 8/9 >> no growth to date Urine Cx 8/9 >> no growth Sputum Cx 8/13 >>  Antimicrobials:  Vancomycin (8/9) Cefepime (8/9- )  Interim history/subjective:  In the interim, Impella device removed. He continues on norepinephrine (weaned to 2) and epinephrine 2 with good pulse pressure. Cardiac output 5.8 with Cardiac index 2.9. CVP 8 and PCWP 12.  Patient was having increased secretions overnight and FiO2 was increased to 50%. Tube feeds were also decreased. Sedation weaned to fentanyl 175 and midazolam 1.   Objective   Blood pressure (!) 114/44, pulse 65, temperature 98.2 F (36.8 C), resp. rate (!) 27, height 5' 9.02" (1.753 m), weight 92.6 kg, SpO2 100 %. PAP: (23-40)/(12-29) 27/20 CVP:  [4 mmHg-20 mmHg] 13 mmHg PCWP:  [11 mmHg-12 mmHg] 11 mmHg CO:  [5 L/min-7.4 L/min] 7.4 L/min CI:  [2.5 L/min/m2-3.7 L/min/m2] 3.7 L/min/m2  Vent Mode: PRVC FiO2 (%):  [40 %-60 %] 50 % Set Rate:  [  30 bmp] 30 bmp Vt Set:  [560 mL] 560 mL PEEP:  [5 cmH20] 5 cmH20 Plateau Pressure:  [20 cmH20-26 cmH20] 26 cmH20   Intake/Output Summary (Last 24 hours) at 04/04/2019 0709 Last data filed at 04/04/2019 0600 Gross per 24 hour  Intake 3728.38 ml  Output 3090 ml  Net 638.38 ml   Filed Weights    04/02/19 0500 04/03/19 0600 04/04/19 0500  Weight: 93.5 kg 93.8 kg 92.6 kg    Examination: General: Lying in bed. Intubated, sedated. HENT: ETT in place. Left IJ cath in place. Pupils equal, constricted, reactive Lungs: Equal breath sounds bilaterally, rhonchi noted. Cardiovascular: Normal rate, regular rhythm, distant heart sounds. No murmurs appreciated. Abdomen: Soft, non-distended. Positive bowel sounds. Extremities: No lower extremity edema. Dorsalis pedis pulse in RLE 2+. Unable to palpate dorsalis pedis pulse in LLE, but have been able to obtain with Doppler. Neuro: Sedated. Opens eyes at times. Does not follow commands. Not withdrawing to noxious stimuli. GU: Foley catheter in place, draining normal appearing urine.  CXR: Stable position of lines. Perihilar congestion and mild airspace disease unchanged.  Resolved Hospital Problem list     Assessment & Plan:  Stephen Jennings is a 63 year old gentleman with no known PMH who presented with acute respiratory distress via EMS, intubated in the ED. Code STEMI was called and patient was taken to the cath lab with complete occlusion of the LAD, subsequently developed cardiogenic shock. He is s/p PCI with DES of the LAD and Impella placement, requiring dual vasopressor support with norepinephrine and epinephrine. PCCM consulted for vent management and hemodynamic management.  Acute Respiratory Failure - Start wake up assessments and begin weaning vent settings. Will wean FiO2 to 35%. - Continue fentanyl. - Discontinue midazolam. - Continue cefepime (8/9- ) given concern for possible aspiration. - Obtain sputum culture. - Initiate diuresis with low-dose furosemide 20mg  IV x 1 and continue to diurese as tolerated prior to extubation. - Pulmonary hygiene. - VAP precautions.  Cardiogenic Shock 2/2 anterior STEMI (8/9) Patient with good cardiac output and pulse pressure s/p Impella removal (8/12). He continues on vasopressors with NE 2 and  epi 2. - Followed by Advanced Heart Failure Team. - Continue to wean norepinephrine and epinephrine as tolerated. - Continue milrinone.  Concern for Aspiration Pneumonia Fever and leukocytosis improved, but patient with increased secretions and CXR c/f for possible PNA. BCx with no growth to date. - Continue cefepime (8/9- ). - Obtain sputum culture. - Follow culture data.  Anemia Hemoglobin decreased to 8.2.  - Transfuse for Hgb > 8.0. - Trend CBC.   Anterior STEMI (8/9) s/p PCI/DES - Continue aspirin, ticagrelor, and atorvastatin. - Holding beta blocker and ace inhibitor.  Non-sustained VT/PVC Improving with amiodarone.  - Continue amiodarone gtt. - Maintain potassium > 4.0 and Mag > 2.0. - Will supplement magnesium.  Renal Serum Cr continues to trend down. 1.72 on admission to 0.83 today (8/13). - Continue to monitor for renal injury given shock state. - Follow BMP.  Thrombocytopenia Platelets 106, stable, likely in setting of mechanical destruction due to Impella device. Anticipate improvement now that Impella has been removed. - Continue to monitor.   Elevated Blood Glucose Blood glucose elevated to 340 on admission and hemoglobin A1c of 6.0. No known history of diabetes.  - Continue SSI.  Best practice:  Diet: NPO Pain/Anxiety/Delirium protocol (if indicated): Yes VAP protocol (if indicated): Yes DVT prophylaxis: On heparin GI prophylaxis: Pantoprazole Glucose control: SSI Mobility: Bedrest Code Status: Full code Family  Communication: Plan to update patient's family Disposition: ICU  Labs   CBC: Recent Labs  Lab 03/31/19 0013  03/31/19 0503  04/03/19 0137 04/03/19 0455 04/03/19 0500 04/03/19 1251 04/04/19 0012 04/04/19 0429  WBC 15.0*  --  16.8*   < > 8.6 9.8  --  8.9 7.3 7.2  NEUTROABS 6.6  --  13.8*  --   --   --   --   --   --   --   HGB 17.0   < > 11.9*   < > 8.6* 9.0* 8.2* 8.6* 8.1* 8.2*  HCT 52.6*   < > 37.4*   < > 26.4* 28.0* 24.0* 26.4*  25.0* 25.4*  MCV 96.5  --  96.9   < > 94.3 96.2  --  93.6 94.7 94.4  PLT 388  --  314   < > 93* 102*  --  96* 99* 106*   < > = values in this interval not displayed.    Basic Metabolic Panel: Recent Labs  Lab 03/31/19 1552  04/01/19 0317  04/01/19 2109 04/02/19 0423 04/02/19 0439 04/03/19 0455 04/03/19 0500 04/04/19 0406  NA 143   < > 140   < > 136 136 138 135 137 135  K 3.9   < > 3.6   < > 3.5 4.3 4.3 4.3 4.3 4.5  CL 115*  --  111  --  107 107  --  104  --  103  CO2 20*  --  21*  --  22 21*  --  23  --  24  GLUCOSE 129*  --  159*  --  149* 161*  --  156*  --  208*  BUN 18  --  18  --  19 19  --  21  --  23  CREATININE 1.29*  --  1.11  --  0.92 0.92  --  0.81  --  0.83  CALCIUM 7.6*  --  7.5*  --  7.7* 8.1*  --  8.1*  --  7.8*  MG 1.8  --  2.0  --  1.7 2.0  --  1.6*  --   --   PHOS  --   --  2.2*  --   --  2.2*  --  3.4  --   --    < > = values in this interval not displayed.   GFR: Estimated Creatinine Clearance: 103.8 mL/min (by C-G formula based on SCr of 0.83 mg/dL). Recent Labs  Lab 03/31/19 0503  03/31/19 1205 03/31/19 1552  04/01/19 1312  04/03/19 0455 04/03/19 1251 04/04/19 0012 04/04/19 0429  WBC 16.8*   < >  --  14.0*   < >  --    < > 9.8 8.9 7.3 7.2  LATICACIDVEN 3.0*  --  3.5* 2.5*  --  1.4  --   --   --   --   --    < > = values in this interval not displayed.    Liver Function Tests: Recent Labs  Lab 03/31/19 0013 03/31/19 0503 04/01/19 0317 04/02/19 0423  AST 45* 55* 79* 47*  ALT 28 18 21 18   ALKPHOS 109 70 60 52  BILITOT 1.0 0.8 1.3* 0.9  PROT 7.8 4.5* 4.3* 4.9*  ALBUMIN 3.9 2.4* 2.4* 2.3*   No results for input(s): LIPASE, AMYLASE in the last 168 hours. No results for input(s): AMMONIA in the last 168 hours.  ABG    Component Value Date/Time  PHART 7.436 04/03/2019 0500   PCO2ART 36.0 04/03/2019 0500   PO2ART 73.0 (L) 04/03/2019 0500   HCO3 24.2 04/03/2019 0500   TCO2 25 04/03/2019 0500   ACIDBASEDEF 2.0 04/02/2019 0439   O2SAT  55.0 04/04/2019 0420     Coagulation Profile: Recent Labs  Lab 03/31/19 0503  INR >10.0*    Cardiac Enzymes: No results for input(s): CKTOTAL, CKMB, CKMBINDEX, TROPONINI in the last 168 hours.  HbA1C: Hgb A1c MFr Bld  Date/Time Value Ref Range Status  03/31/2019 05:03 AM 6.0 (H) 4.8 - 5.6 % Final    Comment:    (NOTE) Pre diabetes:          5.7%-6.4% Diabetes:              >6.4% Glycemic control for   <7.0% adults with diabetes     CBG: Recent Labs  Lab 04/03/19 1112 04/03/19 1503 04/03/19 2024 04/04/19 0002 04/04/19 0420  GLUCAP 183* 155* 71 157* 185*    Past Medical History  He,  has a past medical history of Medical history non-contributory.   Surgical History    Past Surgical History:  Procedure Laterality Date  . CORONARY/GRAFT ACUTE MI REVASCULARIZATION N/A 03/31/2019   Procedure: Coronary/Graft Acute MI Revascularization;  Surgeon: Troy Sine, MD;  Location: [facility];  Service: Cardiovascular;  Laterality: N/A;  . LEFT HEART CATH AND CORONARY ANGIOGRAPHY N/A 03/31/2019   Procedure: LEFT HEART CATH AND CORONARY ANGIOGRAPHY;  Surgeon: Troy Sine, MD;  Location: [facility];  Service: Cardiovascular;  Laterality: N/A;  . NO PAST SURGERIES    . RIGHT HEART CATH N/A 03/31/2019   Procedure: RIGHT HEART CATH;  Surgeon: Troy Sine, MD;  Location: [facility];  Service: Cardiovascular;  Laterality: N/A;  . VENTRICULAR ASSIST DEVICE INSERTION N/A 03/31/2019   Procedure: VENTRICULAR ASSIST DEVICE INSERTION;  Surgeon: Troy Sine, MD;  Location: [facility];  Service: Cardiovascular;  Laterality: N/A;     Social History   reports that he has been smoking. He has never used smokeless tobacco. He reports that he does not drink alcohol or use drugs.   Family History   His Family history is unknown by patient.   Allergies No Known Allergies   Home Medications  Prior to Admission medications   Medication Sig Start Date  End Date Taking? Authorizing Provider  Naphazoline HCl (CLEAR EYES OP) Place 1 drop into both eyes daily as needed (dry eyes/ irritation).   Yes [provider]     Atilano Ina, MS4

## 2019-04-04 NOTE — Progress Notes (Signed)
[facility] Progress Note Patient Name: Stephen Jennings DOB: 1956-04-10 MRN: [id number]   Date of Service  04/04/2019  HPI/Events of Note  Patient agitated on fentanyl gtt, hypertensive, dropping sats.  Also with emesis coming out of ETT.    eICU Interventions  Plan: 2 mg versed x one now Precedex gtt for sedation Hold TF - OGT to intermittent wall suction. Use zofran already ordered for emesis     Intervention Category Major Interventions: Delirium, psychosis, severe agitation - evaluation and management  DETERDING,ELIZABETH 04/04/2019, 11:59 PM

## 2019-04-04 NOTE — Progress Notes (Signed)
Advanced Heart Failure Rounding Note  PCP-Cardiologist: No primary care provider on file.   Subjective:    Remains intubated/sedated. Had increased secretions las night and turned up to 0.50 FiO2  Impella pulled last night. On epi 2. NE down to 2. SBP 110. CO-ox   Swan #s done personally with RN CVP 8 PA 34/19 PCWP 12 SVR 750 CO 5.8/2.9   Objective:   Weight Range: 92.6 kg Body mass index is 30.13 kg/m.   Vital Signs:   Temp:  [97.9 F (36.6 C)-99 F (37.2 C)] 98.4 F (36.9 C) (08/13 0700) Pulse Rate:  [36-76] 36 (08/13 0750) Resp:  [13-31] 30 (08/13 0750) BP: (100-141)/(42-68) 114/44 (08/13 0428) SpO2:  [92 %-100 %] 100 % (08/13 0750) Arterial Line BP: (107-153)/(36-56) 107/36 (08/13 0700) FiO2 (%):  [40 %-60 %] 50 % (08/13 0750) Weight:  [92.6 kg] 92.6 kg (08/13 0500) Last BM Date: (PTA)  Weight change: Filed Weights   04/02/19 0500 04/03/19 0600 04/04/19 0500  Weight: 93.5 kg 93.8 kg 92.6 kg    Intake/Output:   Intake/Output Summary (Last 24 hours) at 04/04/2019 0905 Last data filed at 04/04/2019 0700 Gross per 24 hour  Intake 3220.71 ml  Output 2815 ml  Net 405.71 ml      Physical Exam  CVP 8  General:  Intubated/sedated.   HEENT: ETT  Neck: supple. JVP 9. Carotids 2+ bilat; no bruits. No lymphadenopathy or thryomegaly appreciated. Cor: PMI nondisplaced. Regular rate & rhythm. No rubs, gallops or murmurs. Lungs: +rhonchi Abdomen: obese soft, nontender, nondistended. No hepatosplenomegaly. No bruits or masses. Good bowel sounds. Extremities: no cyanosis, clubbing, rash, edema R groin site ok  Neuro: intubated/sedated   Telemetry  SR 60s Personally reviewed   EKG    N/a   Labs    CBC Recent Labs    04/04/19 0012 04/04/19 0429  WBC 7.3 7.2  HGB 8.1* 8.2*  HCT 25.0* 25.4*  MCV 94.7 94.4  PLT 99* 956*   Basic Metabolic Panel Recent Labs    04/02/19 0423  04/03/19 0455 04/03/19 0500 04/04/19 0406  NA 136   < > 135 137  135  K 4.3   < > 4.3 4.3 4.5  CL 107  --  104  --  103  CO2 21*  --  23  --  24  GLUCOSE 161*  --  156*  --  208*  BUN 19  --  21  --  23  CREATININE 0.92  --  0.81  --  0.83  CALCIUM 8.1*  --  8.1*  --  7.8*  MG 2.0  --  1.6*  --   --   PHOS 2.2*  --  3.4  --   --    < > = values in this interval not displayed.   Liver Function Tests Recent Labs    04/02/19 0423  AST 47*  ALT 18  ALKPHOS 52  BILITOT 0.9  PROT 4.9*  ALBUMIN 2.3*   No results for input(s): LIPASE, AMYLASE in the last 72 hours. Cardiac Enzymes No results for input(s): CKTOTAL, CKMB, CKMBINDEX, TROPONINI in the last 72 hours.  BNP: BNP (last 3 results) Recent Labs    03/31/19 0013  BNP 1,247.4*    ProBNP (last 3 results) No results for input(s): PROBNP in the last 8760 hours.   D-Dimer No results for input(s): DDIMER in the last 72 hours. Hemoglobin A1C No results for input(s): HGBA1C in the last 72 hours.  Fasting Lipid Panel No results for input(s): CHOL, HDL, LDLCALC, TRIG, CHOLHDL, LDLDIRECT in the last 72 hours. Thyroid Function Tests No results for input(s): TSH, T4TOTAL, T3FREE, THYROIDAB in the last 72 hours.  Invalid input(s): FREET3  Other results:   Imaging    Dg Chest 1 View  Result Date: 04/04/2019 CLINICAL DATA:  Inpatient encounter for pulmonary line advancement EXAM: CHEST  1 VIEW COMPARISON:  Radiograph 04/03/2019 FINDINGS: Impella device removed from field of view. Swan-Ganz catheter in the RIGHT pulmonary artery. Endotracheal tube unchanged in position 6.2 cm from carina. NG tube unchanged. Central venous line tip in the distal SVC. Mild central venous pulmonary congestion. Mild perihilar airspace disease. No pneumothorax. IMPRESSION: 1. Impella device removed.  Otherwise stable support apparatus. 2. Perihilar congestion and mild airspace disease unchanged. Electronically Signed   By: Suzy Bouchard M.D.   On: 04/04/2019 08:56     Medications:     Scheduled  Medications: . aspirin  81 mg Per Tube Daily  . atorvastatin  80 mg Per Tube q1800  . chlorhexidine gluconate (MEDLINE KIT)  15 mL Mouth Rinse BID  . Chlorhexidine Gluconate Cloth  6 each Topical Daily  . digoxin  0.125 mg Per Tube Daily  . feeding supplement (PRO-STAT SUGAR FREE 64)  30 mL Per Tube TID  . insulin aspart  0-9 Units Subcutaneous Q4H  . insulin aspart  3 Units Subcutaneous Q4H  . mouth rinse  15 mL Mouth Rinse 10 times per day  . pantoprazole sodium  40 mg Per Tube Daily  . sodium chloride flush  10-40 mL Intracatheter Q12H  . sodium chloride flush  3 mL Intravenous Q12H  . spironolactone  12.5 mg Per Tube Daily  . Thrombi-Pad  1 each Topical Once  . ticagrelor  90 mg Per Tube BID    Infusions: . sodium chloride 10 mL/hr at 04/04/19 0700  . sodium chloride 10 mL/hr at 04/02/19 1500  . sodium chloride Stopped (04/01/19 0947)  . sodium chloride Stopped (04/01/19 1022)  . amiodarone 30 mg/hr (04/04/19 0819)  . ceFEPime (MAXIPIME) IV 2 g (04/04/19 0843)  . epinephrine Stopped (04/04/19 0830)  . feeding supplement (VITAL 1.5 CAL) 55 mL/hr at 04/04/19 0600  . fentaNYL infusion INTRAVENOUS 175 mcg/hr (04/04/19 0700)  . impella catheter heparin 50 unit/mL in dextrose 5%    . heparin Stopped (04/03/19 1653)  . midazolam 1 mg/hr (04/04/19 0700)  . milrinone 0.125 mcg/kg/min (04/04/19 0700)  . norepinephrine (LEVOPHED) Adult infusion 1 mcg/min (04/04/19 0848)    PRN Medications: sodium chloride, sodium chloride, sodium chloride, acetaminophen, fentaNYL, nitroGLYCERIN, ondansetron (ZOFRAN) IV, sodium chloride flush, sodium chloride flush   *  Assessment/Plan   1. Cardiogenic shock due to acute anterior MI - s/p PCI/DES to LAD on 8/9 - Impella removed 8/12 - On NE 2 mcg /EPI 2 mcg . Wean today as possible - Hold diuresis today. CVP 8.   2. CAD with acute anterior MI 8/9 - s/p PCI/DES to LAD. 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 Acute hypoxic respiratory failure - in setting of #1 - Intubated. FiO2 increased yesterday due to increased secretion. CXR stable.  - Remains in broad spectrum abx - Hopefully can begin vent wean. Can give low-dose lasix as needed - CCM managing.   4. DM2 - new diagnosis - cover with SSI  5. COPD - ongoing tobacco use - needs cessation  6. NSVT/PVC - Improved on amio drip. Continue amio drip 30 mg per hour.  - Keep K> 4.0 Mg > 2.0  7. Anemia  - hgb down to 8.2. Transfuse to keep HGb > 8.0  CRITICAL CARE Performed by: [name]  Total critical care time: 45 minutes  Critical care time was exclusive of separately billable procedures and treating other patients.  Critical care was necessary to treat or prevent imminent or life-threatening deterioration.  Critical care was time spent personally by me (independent of midlevel providers or residents) on the following activities: development of treatment plan with patient and/or surrogate as well as nursing, discussions with consultants, evaluation of patient's response to treatment, examination of patient, obtaining history from patient or surrogate, ordering and performing treatments and interventions, ordering and review of laboratory studies, ordering and review of radiographic studies, pulse oximetry and re-evaluation of patient's condition.    Length of Stay: [place], MD  04/04/2019, 9:05 AM  Advanced Heart Failure Team Pager [phone] (M-F; [place])  Please contact [facility] for night-coverage after hours (4p -7a ) and weekends on [email or web address]

## 2019-04-05 ENCOUNTER — Inpatient Hospital Stay (HOSPITAL_COMMUNITY): Payer: Self-pay

## 2019-04-05 LAB — BASIC METABOLIC PANEL
Anion gap: 9 (ref 5–15)
BUN: 28 mg/dL — ABNORMAL HIGH (ref 8–23)
CO2: 22 mmol/L (ref 22–32)
Calcium: 8.6 mg/dL — ABNORMAL LOW (ref 8.9–10.3)
Chloride: 104 mmol/L (ref 98–111)
Creatinine, Ser: 0.85 mg/dL (ref 0.61–1.24)
GFR calc Af Amer: >60 mL/min (ref >60)
GFR calc non Af Amer: >60 mL/min (ref >60)
Glucose, Bld: 149 mg/dL — ABNORMAL HIGH (ref 70–99)
Potassium: 4.1 mmol/L (ref 3.5–5.1)
Sodium: 135 mmol/L (ref 135–145)

## 2019-04-05 LAB — CBC
HCT: 27.7 % — ABNORMAL LOW (ref 39.0–52.0)
HCT: 24.3 % — ABNORMAL LOW (ref 39.0–52.0)
Hemoglobin: 9 g/dL — ABNORMAL LOW (ref 13.0–17.0)
Hemoglobin: 8 g/dL — ABNORMAL LOW (ref 13.0–17.0)
MCH: 30.6 pg (ref 26.0–34.0)
MCH: 30.5 pg (ref 26.0–34.0)
MCHC: 32.5 g/dL (ref 30.0–36.0)
MCHC: 32.9 g/dL (ref 30.0–36.0)
MCV: 94.2 fL (ref 80.0–100.0)
MCV: 92.7 fL (ref 80.0–100.0)
Platelets: 138 10*3/uL — ABNORMAL LOW (ref 150–400)
Platelets: 138 10*3/uL — ABNORMAL LOW (ref 150–400)
RBC: 2.94 MIL/uL — ABNORMAL LOW (ref 4.22–5.81)
RBC: 2.62 MIL/uL — ABNORMAL LOW (ref 4.22–5.81)
RDW: 15 % (ref 11.5–15.5)
RDW: 14.5 % (ref 11.5–15.5)
WBC: 9.5 10*3/uL (ref 4.0–10.5)
WBC: 8.5 10*3/uL (ref 4.0–10.5)
nRBC: 0 % (ref 0.0–0.2)
nRBC: 0.2 % (ref 0.0–0.2)

## 2019-04-05 LAB — COOXEMETRY PANEL
Carboxyhemoglobin: 1.2 % (ref 0.5–1.5)
Methemoglobin: 0.8 % (ref 0.0–1.5)
O2 Saturation: 50.3 %
Total hemoglobin: 7.9 g/dL — ABNORMAL LOW (ref 12.0–16.0)

## 2019-04-05 LAB — GLUCOSE, CAPILLARY
Glucose-Capillary: 122 mg/dL — ABNORMAL HIGH (ref 70–99)
Glucose-Capillary: 122 mg/dL — ABNORMAL HIGH (ref 70–99)
Glucose-Capillary: 126 mg/dL — ABNORMAL HIGH (ref 70–99)
Glucose-Capillary: 129 mg/dL — ABNORMAL HIGH (ref 70–99)
Glucose-Capillary: 149 mg/dL — ABNORMAL HIGH (ref 70–99)
Glucose-Capillary: 156 mg/dL — ABNORMAL HIGH (ref 70–99)

## 2019-04-05 LAB — CULTURE, BLOOD (ROUTINE X 2)
Culture: NO GROWTH
Culture: NO GROWTH
Special Requests: ADEQUATE
Special Requests: ADEQUATE

## 2019-04-05 LAB — LACTATE DEHYDROGENASE: LDH: 415 U/L — ABNORMAL HIGH (ref 98–192)

## 2019-04-05 LAB — PROCALCITONIN: Procalcitonin: 0.12 ng/mL

## 2019-04-05 MED ORDER — IPRATROPIUM-ALBUTEROL 0.5-2.5 (3) MG/3ML IN SOLN
3.0000 mL | Freq: Four times a day (QID) | RESPIRATORY_TRACT | Status: DC
  Administered 2019-04-05 (×3): 3 mL via RESPIRATORY_TRACT
  Filled 2019-04-05 (×3): qty 3

## 2019-04-05 MED ORDER — MIDAZOLAM HCL 2 MG/2ML IJ SOLN
1.0000 mg | INTRAMUSCULAR | Status: DC | PRN
  Administered 2019-04-05: 05:00:00 2 mg via INTRAVENOUS

## 2019-04-05 MED ORDER — SODIUM CHLORIDE 0.9 % IV SOLN
2.0000 g | Freq: Three times a day (TID) | INTRAVENOUS | Status: DC
  Administered 2019-04-05 (×2): 2 g via INTRAVENOUS
  Filled 2019-04-05 (×5): qty 2

## 2019-04-05 MED ORDER — MIDAZOLAM HCL 2 MG/2ML IJ SOLN
INTRAMUSCULAR | Status: AC
  Filled 2019-04-05: qty 2

## 2019-04-05 MED ORDER — DEXMEDETOMIDINE HCL IN NACL 200 MCG/50ML IV SOLN
INTRAVENOUS | Status: AC
  Administered 2019-04-05: 0.4 ug/kg/h via INTRAVENOUS
  Filled 2019-04-05: qty 50

## 2019-04-05 MED ORDER — DEXMEDETOMIDINE HCL IN NACL 400 MCG/100ML IV SOLN
0.0000 ug/kg/h | INTRAVENOUS | Status: DC
  Administered 2019-04-05 – 2019-04-06 (×3): 1 ug/kg/h via INTRAVENOUS
  Filled 2019-04-05 (×3): qty 100

## 2019-04-05 NOTE — Progress Notes (Addendum)
Advanced Heart Failure Rounding Note  PCP-Cardiologist: No primary care provider on file.   Subjective:    Remains intubated/sedated. Impella pulled 8/12  Agitated over night requiring increased sedation.   Remain on norep 5 mcg + milrinone 0.125 mcg. CO-OX pending.   CVP 10  PA 27/13 PCWP 12 SVR 784 CPO 0.7 CO 5.1  CI 2.5    Objective:   Weight Range: 92.3 kg Body mass index is 30.04 kg/m.   Vital Signs:   Temp:  [98.2 F (36.8 C)-99.3 F (37.4 C)] 98.4 F (36.9 C) (08/14 0700) Pulse Rate:  [36-102] 53 (08/14 0700) Resp:  [24-34] 30 (08/14 0700) BP: (78-187)/(50-89) 78/61 (08/14 0700) SpO2:  [93 %-100 %] 100 % (08/14 0700) Arterial Line BP: (82-169)/(38-65) 82/41 (08/14 0700) FiO2 (%):  [30 %-50 %] 40 % (08/14 0310) Weight:  [92.3 kg] 92.3 kg (08/14 0400) Last BM Date: (PTA)  Weight change: Filed Weights   04/03/19 0600 04/04/19 0500 04/05/19 0400  Weight: 93.8 kg 92.6 kg 92.3 kg    Intake/Output:   Intake/Output Summary (Last 24 hours) at 04/05/2019 0732 Last data filed at 04/05/2019 0700 Gross per 24 hour  Intake 1897.36 ml  Output 3250 ml  Net -1352.64 ml      Physical Exam  CVP 10  General:  ETT sedated HEENT: ETT Neck: supple. JVP 8-9. Carotids 2+ bilat; no bruits. No lymphadenopathy or thryomegaly appreciated. Cor: PMI nondisplaced. Regular rate & rhythm. No rubs, gallops or murmurs. Lungs: clear Abdomen: soft, nontender, nondistended. No hepatosplenomegaly. No bruits or masses. Good bowel sounds. Extremities: no cyanosis, clubbing, rash, edema. R femoral swan.  Neuro: sedated on vent.     Telemetry   SR 60s   EKG    N/a   Labs    CBC Recent Labs    04/04/19 1240 04/05/19 0345  WBC 7.1 9.5  HGB 8.3* 9.0*  HCT 26.2* 27.7*  MCV 95.6 94.2  PLT 113* 409*   Basic Metabolic Panel Recent Labs    04/03/19 0455  04/04/19 0406 04/04/19 0740 04/05/19 0345  NA 135   < > 135  --  135  K 4.3   < > 4.5  --  4.1  CL 104   --  103  --  104  CO2 23  --  24  --  22  GLUCOSE 156*  --  208*  --  149*  BUN 21  --  23  --  28*  CREATININE 0.81  --  0.83  --  0.85  CALCIUM 8.1*  --  7.8*  --  8.6*  MG 1.6*  --   --  1.9  --   PHOS 3.4  --   --   --   --    < > = values in this interval not displayed.   Liver Function Tests No results for input(s): AST, ALT, ALKPHOS, BILITOT, PROT, ALBUMIN in the last 72 hours. No results for input(s): LIPASE, AMYLASE in the last 72 hours. Cardiac Enzymes No results for input(s): CKTOTAL, CKMB, CKMBINDEX, TROPONINI in the last 72 hours.  BNP: BNP (last 3 results) Recent Labs    03/31/19 0013  BNP 1,247.4*    ProBNP (last 3 results) No results for input(s): PROBNP in the last 8760 hours.   D-Dimer No results for input(s): DDIMER in the last 72 hours. Hemoglobin A1C No results for input(s): HGBA1C in the last 72 hours. Fasting Lipid Panel No results for input(s): CHOL, HDL,  LDLCALC, TRIG, CHOLHDL, LDLDIRECT in the last 72 hours. Thyroid Function Tests No results for input(s): TSH, T4TOTAL, T3FREE, THYROIDAB in the last 72 hours.  Invalid input(s): FREET3  Other results:   Imaging    Dg Chest 1 View  Result Date: 04/04/2019 CLINICAL DATA:  Inpatient encounter for pulmonary line advancement EXAM: CHEST  1 VIEW COMPARISON:  Radiograph 04/03/2019 FINDINGS: Impella device removed from field of view. Swan-Ganz catheter in the RIGHT pulmonary artery. Endotracheal tube unchanged in position 6.2 cm from carina. NG tube unchanged. Central venous line tip in the distal SVC. Mild central venous pulmonary congestion. Mild perihilar airspace disease. No pneumothorax. IMPRESSION: 1. Impella device removed.  Otherwise stable support apparatus. 2. Perihilar congestion and mild airspace disease unchanged. Electronically Signed   By: Suzy Bouchard M.D.   On: 04/04/2019 08:56     Medications:     Scheduled Medications: . aspirin  81 mg Per Tube Daily  . atorvastatin  80 mg  Per Tube q1800  . chlorhexidine gluconate (MEDLINE KIT)  15 mL Mouth Rinse BID  . Chlorhexidine Gluconate Cloth  6 each Topical Daily  . digoxin  0.125 mg Per Tube Daily  . feeding supplement (PRO-STAT SUGAR FREE 64)  30 mL Per Tube TID  . heparin injection (subcutaneous)  5,000 Units Subcutaneous Q8H  . insulin aspart  0-9 Units Subcutaneous Q4H  . insulin aspart  3 Units Subcutaneous Q4H  . insulin glargine  15 Units Subcutaneous Daily  . mouth rinse  15 mL Mouth Rinse 10 times per day  . midazolam      . midazolam      . pantoprazole sodium  40 mg Per Tube Daily  . sodium chloride flush  10-40 mL Intracatheter Q12H  . sodium chloride flush  3 mL Intravenous Q12H  . spironolactone  12.5 mg Per Tube Daily  . Thrombi-Pad  1 each Topical Once  . ticagrelor  90 mg Per Tube BID    Infusions: . sodium chloride 10 mL/hr at 04/05/19 0700  . sodium chloride 10 mL/hr at 04/02/19 1500  . sodium chloride Stopped (04/01/19 0947)  . sodium chloride Stopped (04/01/19 1022)  . amiodarone 30 mg/hr (04/05/19 0700)  . ceFEPime (MAXIPIME) IV Stopped (04/05/19 0014)  . dexmedetomidine    . epinephrine Stopped (04/04/19 6962)  . feeding supplement (VITAL 1.5 CAL) Stopped (04/04/19 1600)  . fentaNYL infusion INTRAVENOUS 150 mcg/hr (04/05/19 0700)  . milrinone 0.125 mcg/kg/min (04/05/19 0700)  . norepinephrine (LEVOPHED) Adult infusion 5 mcg/min (04/05/19 0700)    PRN Medications: sodium chloride, sodium chloride, sodium chloride, acetaminophen, fentaNYL, midazolam, nitroGLYCERIN, ondansetron (ZOFRAN) IV, sodium chloride flush, sodium chloride flush   *  Assessment/Plan   1. Cardiogenic shock due to acute anterior MI - s/p PCI/DES to LAD on 8/9 - Impella removed 8/12 - On NE 5 mcg. CO ok today. CPO 0.7.     2. CAD with acute anterior MI 8/9 - s/p PCI/DES to LAD. 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 Acute hypoxic respiratory failure - in setting of #1 - Intubated. FiO2   Remains in broad spectrum abx - CCM managing.   4. DM2 - new diagnosis - cover with SSI  5. COPD - ongoing tobacco use - needs cessation   6. NSVT/PVC - Improved on amio drip. Continue amio drip 30 mg per hour.  - Keep K> 4.0 Mg > 2.0  7. Anemia  - Hgb stable. Transfuse to keep HGb > 8.0    Length of Stay: [place], NP  04/05/2019, 7:32 AM  Advanced Heart Failure Team Pager [phone] (M-F; [place])  Please contact [facility] for night-coverage after hours (4p -7a ) and weekends on [email or web address]  Agree with above  Remains intubated and sedated on low-dose NE/milrinone. Hemodynamics much improved. Luiz Blare numbers done personally at bedside. PCWP 12. CO 5.5 L/min. PCT low  Exam Intubated/sedated JVP 10 + ETT Cor RRR Lungs coarse Ab obese NT Ext warm no edema RFV swan  He appears optimized from cardia perspective. Have d/w CCM and will plan to try and wean from vent today. Continue NE/milrinone for now. Hopefully can pull swan soon.   CRITICAL CARE Performed by: [name]  Total critical care time: 35 minutes  Critical care time was exclusive of separately billable procedures and treating other patients.  Critical care was necessary to treat or prevent imminent or life-threatening deterioration.  Critical care was time spent personally by me (independent of midlevel providers or residents) on the following activities: development of treatment plan with patient and/or surrogate as well as nursing, discussions with consultants, evaluation of patient's response to treatment, examination of patient, obtaining history from patient or surrogate, ordering and performing treatments and interventions, ordering and review of laboratory studies, ordering and review of radiographic studies, pulse oximetry and re-evaluation of patient's condition.   [name],  MD  12:46 PM

## 2019-04-05 NOTE — Progress Notes (Signed)
Pharmacy Antibiotic Note  Stephen Jennings is a 63 y.o. male admitted on 03/31/2019 with cardiogenic shock due to acute anterior MI. Initially started on vancomycin and cefepime on 8/9 for possible line infection. Cefepime was continued for possible pneumonia based on secretions and chest xray. Trach aspirate collected on 8/13 now with moderate pseudomonas. CCM concerned for possible resistance to cefepime. Pharmacy consulted for meropenem until sensitivities return.  WBC is wnl and patient has remained afebrile. LA 3>2.5>1.4 Pct 0.12  Plan: Meropenem 2 g IV q8h Monitor renal fx, clinical improvement, cx results  Height: 5' 9.02" (175.3 cm) Weight: 203 lb 7.8 oz (92.3 kg) IBW/kg (Calculated) : 70.74  Temp (24hrs), Avg:98.6 F (37 C), Min:98.1 F (36.7 C), Max:99 F (37.2 C)  Recent Labs  Lab 03/31/19 0503  03/31/19 1205 03/31/19 1552  04/01/19 1312 04/01/19 2109  04/02/19 0423  04/03/19 0455 04/03/19 1251 04/04/19 0012 04/04/19 0406 04/04/19 0429 04/04/19 1240 04/05/19 0345  WBC 16.8*   < >  --  14.0*   < >  --   --    < > 11.7*   < > 9.8 8.9 7.3  --  7.2 7.1 9.5  CREATININE 1.19   < >  --  1.29*   < >  --  0.92  --  0.92  --  0.81  --   --  0.83  --   --  0.85  LATICACIDVEN 3.0*  --  3.5* 2.5*  --  1.4  --   --   --   --   --   --   --   --   --   --   --    < > = values in this interval not displayed.    Estimated Creatinine Clearance: 101.1 mL/min (by C-G formula based on SCr of 0.85 mg/dL).    No Known Allergies  Antimicrobials this admission: 8/9 Vancomycin >> 8/10 8/9 Cefepime >> 8/14 8/14 Meropenem >>  Microbiology results: 8/9 BCx (x2): ngF 8/9 UCx: ngF 8/9 MRSA PCR: Neg 8/13 TA: moderate pseudomonas  Thank you for allowing pharmacy to be a part of this patient's care.  Vertis Kelch, PharmD PGY2 Cardiology Pharmacy Resident Phone [phone] 04/05/2019       10:11 AM  Please check [email or web address] for unit-specific pharmacist phone numbers

## 2019-04-05 NOTE — Progress Notes (Signed)
NAME:  Stephen Jennings, MRN:  [id number], DOB:  1956/05/15, LOS: 5 ADMISSION DATE:  03/31/2019, CONSULTATION DATE:  03/31/2019 REFERRING MD:  Dr. Claiborne Billings, Cardiology, CHIEF COMPLAINT:  Acute respiratory failure, cardiogenic shock  Brief History   65M with no known PMHx who presented with respiratory distress via EMS, intubated in the ED.  Code STEMI was called and patient was taken to the cath lab with complete occlusion of the LAD with cardiogenic shock.  PCCM consulted for vent management and hemodynamic management.  History of present illness   Stephen Jennings is a 26 year old gentleman with no known PMHx who presented with respiratory distress via EMS. Patient's wife described that patient had episodes of chest pain/pressure and diaphoresis in the weeks leading up to presentation. On the day of admission, he was getting ready for bed and then became acutely dyspneic, at which point EMS was called. He was intubated in the ED. A code STEMI was called and patient was taken to the cath lab with complete occlusion of the LAD and subsequently developed cardiogenic shock. Impella device was placed and he was started on dual pressor support with norepinephrine and epinephrine. PCCM consulted for vent management and hemodynamic management.  Past Medical History  No known past medical history  Significant Hospital Events   03/31/19: Intubation; Anterior STEMI with 100% LAD occlusion, s/p PCI/DES, Impella  03/31/19: Code, CPR and 1 amp epi given with immediate ROSC   04/03/19: Impella removed  Consults:  Cardiology Heart Failure Team PCCM  Procedures:  03/31/19: ETT placed  03/31/19: PCI to LAD with DES placement.   Impella 03/31/19 >> 04/03/19  03/31/19: Left IJ TLC  03/31/19: Swan-Ganz catheter  Significant Diagnostic Tests:  03/31/19 Left heart cath: Revealed 100% occlusion of LAD, 70% stenosis of RCA.  03/31/19 Echocardiogram: The left ventricle has severely reduced systolic function, with an ejection fraction  of 20-25%. The cavity size was moderately dilated. Left ventricular diastolic function could not be evaluated. Septal apical and anterior wall hyppkinesis Impella device seen in LV extending to mid ventrical CW through AV suggests low output. The right ventricle has moderately reduced systolic function. The cavity was mildly enlarged. There is no increase in right ventricular wall thickness. Left atrial size was moderately dilated. Right atrial size was mildly dilated. The aortic valve was not well visualized. Impella device seen coursing across AV. The aorta is normal in size and structure. The interatrial septum was not well visualized   Micro Data:  MRSA PCR 8/9 >> negative SARS CoV 2 8/9 >> negative Blood Cx 8/9 >> no growth to date Urine Cx 8/9 >> no growth Sputum Cx 8/13 >> moderate pseudomonas (susceptibilities pending)  Antimicrobials:  Vancomycin (8/9) Cefepime (8/9-8/14) Meropenem (8/14- )  Interim history/subjective:  Overnight, Stephen Jennings received midazolam 2mg  x 1 for agitation. He was also noted to have emesis from his ETT. He was started on Precedex gtt for sedation and tube feeds were held. This morning, Stephen Jennings is quite sedated and does not respond to voice.  He did develop some bradycardia to the 50s, likely secondary to Precedex initiation overnight.  Sputum culture returned with moderate pseudomonas. Patient remains afebrile; no leukocytosis but white count did increase from 7.3 to 9.5.  Objective   Blood pressure (!) 86/58, pulse (!) 54, temperature 98.2 F (36.8 C), resp. rate (!) 30, height 5' 9.02" (1.753 m), weight 92.3 kg, SpO2 100 %. PAP: (19-31)/(9-21) 23/16 CVP:  [3 mmHg-12 mmHg] 12 mmHg PCWP:  [12 mmHg-20 mmHg]  20 mmHg CO:  [5.8 L/min-8.4 L/min] 8.4 L/min CI:  [2.9 L/min/m2-4.2 L/min/m2] 4.2 L/min/m2  Vent Mode: PRVC FiO2 (%):  [30 %-50 %] 40 % Set Rate:  [30 bmp] 30 bmp Vt Set:  [560 mL] 560 mL PEEP:  [5 cmH20] 5 cmH20 Plateau Pressure:   [19 cmH20-27 cmH20] 19 cmH20   Intake/Output Summary (Last 24 hours) at 04/05/2019 0802 Last data filed at 04/05/2019 0800 Gross per 24 hour  Intake 1916.45 ml  Output 3075 ml  Net -1158.55 ml   Filed Weights   04/03/19 0600 04/04/19 0500 04/05/19 0400  Weight: 93.8 kg 92.6 kg 92.3 kg    Examination: General: Lying in bed. Intubated, sedated. HENT: ETT in place. Left IJ cath in place. Pupils equal, constricted, reactive Lungs: Equal breath sounds bilaterally, lungs clear to auscultation bilaterally. Cardiovascular: Normal rate, regular rhythm, distant heart sounds. No murmurs appreciated. Abdomen: Soft, non-distended. Positive bowel sounds. Extremities: No lower extremity edema. Dorsalis pedis pulse in RLE 2+. Unable to palpate dorsalis pedis pulse in LLE, but have been able to obtain with Doppler. Right femoral Swan-Ganz catheter in place. Neuro: Sedated. Does not open eyes to voice. Does not follow commands. Not withdrawing to noxious stimuli. GU: Foley catheter in place, draining normal appearing urine.  CXR: Swan-Ganz catheter tip in descending right interlobar pulmonary artery. Mild, hazy basilar airspace opacities are greater on the right and most compatible with atelectasis.  Resolved Hospital Problem list     Assessment & Plan:  Stephen Jennings is a 63 year old gentleman with no known PMH who presented with acute respiratory distress via EMS, intubated in the ED. Code STEMI was called and patient was taken to the cath lab with complete occlusion of the LAD, subsequently developed cardiogenic shock. He is s/p PCI with DES of the LAD and Impella placement, requiring dual vasopressor support with norepinephrine and epinephrine. PCCM consulted for vent management and hemodynamic management.  Acute Respiratory Failure - Patient continues to breathe above the rate set on the ventilator. Will reduce set rate.  - Planning on SBT once patient's mental status will permit.  - Discontinue  fentanyl. - Wean Precedex. - Transition cefepime (8/9-8/14) to meropenam (8/14- ) given concern for pseudomonas resistance. Will return to cefepime if susceptible; plan on 14 day total course of antibiotics. - Follow up susceptibilities on sputum culture. - Initiate Duonebs. - Holding diuresis today. - Pulmonary hygiene. - VAP precautions.  Cardiogenic Shock 2/2 anterior STEMI (8/9) Patient with reasonable cardiac output and pulse pressure s/p Impella removal (8/12). He has weaned off epinephrine. Continues on NE at 5 and milrinone at 0.125. - Followed by Advanced Heart Failure Team. - Continue to wean norepinephrine as tolerated. - Continue milrinone.  Aspiration Pneumonia, sputum culture growing pseudomonas Concern that the pseudomonas may be resistant to cefepime given that patient has been on cefepime since 8/9 and still growing pseudomonas. Patient remains afebrile, white count increased to 9.5. BCx with no growth to date. - Transition to meropenem (8/14- ) from cefepime (8/9- ). If susceptible to cefepime, will re-start cefepime. - Plan on 14 day total course of antibiotics. - Follow up susceptibilities.   Anemia Hemoglobin increased to 9.0.  - Transfuse for Hgb > 8.0. - Trend CBC.   Anterior STEMI (8/9) s/p PCI/DES - Continue aspirin, ticagrelor, and atorvastatin. - Holding beta blocker and ace inhibitor.  Non-sustained VT/PVC Improving with amiodarone.  - Amiodarone gtt per Heart Failure Team. - Maintain potassium > 4.0 and Mag > 2.0. - Will  supplement as needed.  Renal Serum Cr continues to trend down. 1.72 on admission to 0.85 today (8/14). - Continue to monitor for renal injury given shock state. - Follow BMP.  Thrombocytopenia Platelet count improving, 138 today. Anticipate continued improvement now that Impella has been removed. - Continue to monitor.   Elevated Blood Glucose Blood glucose elevated to 340 on admission and hemoglobin A1c of 6.0. No known  history of diabetes.  - Continue SSI.  Best practice:  Diet: NPO Pain/Anxiety/Delirium protocol (if indicated): Yes VAP protocol (if indicated): Yes DVT prophylaxis: On heparin GI prophylaxis: Pantoprazole Glucose control: SSI Mobility: Bedrest Code Status: Full code Family Communication: Plan to update patient's family Disposition: ICU  Labs   CBC: Recent Labs  Lab 03/31/19 0013  03/31/19 0503  04/03/19 1251 04/04/19 0012 04/04/19 0429 04/04/19 1240 04/05/19 0345  WBC 15.0*  --  16.8*   < > 8.9 7.3 7.2 7.1 9.5  NEUTROABS 6.6  --  13.8*  --   --   --   --   --   --   HGB 17.0   < > 11.9*   < > 8.6* 8.1* 8.2* 8.3* 9.0*  HCT 52.6*   < > 37.4*   < > 26.4* 25.0* 25.4* 26.2* 27.7*  MCV 96.5  --  96.9   < > 93.6 94.7 94.4 95.6 94.2  PLT 388  --  314   < > 96* 99* 106* 113* 138*   < > = values in this interval not displayed.    Basic Metabolic Panel: Recent Labs  Lab 04/01/19 0317  04/01/19 2109 04/02/19 0423 04/02/19 0439 04/03/19 0455 04/03/19 0500 04/04/19 0406 04/04/19 0740 04/05/19 0345  NA 140   < > 136 136 138 135 137 135  --  135  K 3.6   < > 3.5 4.3 4.3 4.3 4.3 4.5  --  4.1  CL 111  --  107 107  --  104  --  103  --  104  CO2 21*  --  22 21*  --  23  --  24  --  22  GLUCOSE 159*  --  149* 161*  --  156*  --  208*  --  149*  BUN 18  --  19 19  --  21  --  23  --  28*  CREATININE 1.11  --  0.92 0.92  --  0.81  --  0.83  --  0.85  CALCIUM 7.5*  --  7.7* 8.1*  --  8.1*  --  7.8*  --  8.6*  MG 2.0  --  1.7 2.0  --  1.6*  --   --  1.9  --   PHOS 2.2*  --   --  2.2*  --  3.4  --   --   --   --    < > = values in this interval not displayed.   GFR: Estimated Creatinine Clearance: 101.1 mL/min (by C-G formula based on SCr of 0.85 mg/dL). Recent Labs  Lab 03/31/19 0503  03/31/19 1205 03/31/19 1552  04/01/19 1312  04/04/19 0012 04/04/19 0429 04/04/19 1240 04/04/19 2320 04/05/19 0345  PROCALCITON  --   --   --   --   --   --   --   --   --   --  0.14 0.12   WBC 16.8*   < >  --  14.0*   < >  --    < >  7.3 7.2 7.1  --  9.5  LATICACIDVEN 3.0*  --  3.5* 2.5*  --  1.4  --   --   --   --   --   --    < > = values in this interval not displayed.    Liver Function Tests: Recent Labs  Lab 03/31/19 0013 03/31/19 0503 04/01/19 0317 04/02/19 0423  AST 45* 55* 79* 47*  ALT 28 18 21 18   ALKPHOS 109 70 60 52  BILITOT 1.0 0.8 1.3* 0.9  PROT 7.8 4.5* 4.3* 4.9*  ALBUMIN 3.9 2.4* 2.4* 2.3*   No results for input(s): LIPASE, AMYLASE in the last 168 hours. No results for input(s): AMMONIA in the last 168 hours.  ABG    Component Value Date/Time   PHART 7.436 04/03/2019 0500   PCO2ART 36.0 04/03/2019 0500   PO2ART 73.0 (L) 04/03/2019 0500   HCO3 24.2 04/03/2019 0500   TCO2 25 04/03/2019 0500   ACIDBASEDEF 2.0 04/02/2019 0439   O2SAT 50.3 04/05/2019 0730     Coagulation Profile: Recent Labs  Lab 03/31/19 0503  INR >10.0*    Cardiac Enzymes: No results for input(s): CKTOTAL, CKMB, CKMBINDEX, TROPONINI in the last 168 hours.  HbA1C: Hgb A1c MFr Bld  Date/Time Value Ref Range Status  03/31/2019 05:03 AM 6.0 (H) 4.8 - 5.6 % Final    Comment:    (NOTE) Pre diabetes:          5.7%-6.4% Diabetes:              >6.4% Glycemic control for   <7.0% adults with diabetes     CBG: Recent Labs  Lab 04/04/19 0822 04/04/19 1122 04/04/19 1643 04/04/19 2054 04/05/19 0028  GLUCAP 181* 149* 111* 131* 149*    Past Medical History  He,  has a past medical history of Medical history non-contributory.   Surgical History    Past Surgical History:  Procedure Laterality Date  . CORONARY/GRAFT ACUTE MI REVASCULARIZATION N/A 03/31/2019   Procedure: Coronary/Graft Acute MI Revascularization;  Surgeon: Troy Sine, MD;  Location: [facility];  Service: Cardiovascular;  Laterality: N/A;  . LEFT HEART CATH AND CORONARY ANGIOGRAPHY N/A 03/31/2019   Procedure: LEFT HEART CATH AND CORONARY ANGIOGRAPHY;  Surgeon: Troy Sine, MD;  Location:  [facility];  Service: Cardiovascular;  Laterality: N/A;  . NO PAST SURGERIES    . RIGHT HEART CATH N/A 03/31/2019   Procedure: RIGHT HEART CATH;  Surgeon: Troy Sine, MD;  Location: [facility];  Service: Cardiovascular;  Laterality: N/A;  . VENTRICULAR ASSIST DEVICE INSERTION N/A 03/31/2019   Procedure: VENTRICULAR ASSIST DEVICE INSERTION;  Surgeon: Troy Sine, MD;  Location: [facility];  Service: Cardiovascular;  Laterality: N/A;     Social History   reports that he has been smoking. He has never used smokeless tobacco. He reports that he does not drink alcohol or use drugs.   Family History   His Family history is unknown by patient.   Allergies No Known Allergies   Home Medications  Prior to Admission medications   Medication Sig Start Date End Date Taking? Authorizing Provider  Naphazoline HCl (CLEAR EYES OP) Place 1 drop into both eyes daily as needed (dry eyes/ irritation).   Yes [provider]     Atilano Ina, MS4

## 2019-04-05 NOTE — Procedures (Signed)
Extubation Procedure Note  Patient Details:   Name: Clois Treanor DOB: 08/22/1956 MRN: [id number]   Airway Documentation:    Vent end date: (not recorded) Vent end time: (not recorded)   Evaluation  O2 sats: stable throughout Complications: No apparent complications Patient did tolerate procedure well. Bilateral Breath Sounds: Diminished   No   RT extubated patient to bipap with MD and RN at bedside, per MD order. Positive cuff leak noted. No stridor noted. MD at bedside and aware of high tidal volumes and minute ventilation. Per MD leave patient on bipap. Patient seems to have some AMS, however MD had followed commands for her prior to extubation. RT will continue to monitor.   Vernona Rieger 04/05/2019, 12:29 PM

## 2019-04-05 NOTE — Progress Notes (Signed)
MD Clark placed pt on Cpap/PS 5/5 at 11:15. RN will continue to monitor.

## 2019-04-06 ENCOUNTER — Inpatient Hospital Stay (HOSPITAL_COMMUNITY): Payer: Self-pay

## 2019-04-06 DIAGNOSIS — I472 Ventricular tachycardia: Secondary | ICD-10-CM

## 2019-04-06 DIAGNOSIS — R579 Shock, unspecified: Secondary | ICD-10-CM

## 2019-04-06 DIAGNOSIS — Z9911 Dependence on respirator [ventilator] status: Secondary | ICD-10-CM

## 2019-04-06 LAB — CULTURE, RESPIRATORY W GRAM STAIN

## 2019-04-06 LAB — BASIC METABOLIC PANEL
Anion gap: 8 (ref 5–15)
BUN: 18 mg/dL (ref 8–23)
CO2: 23 mmol/L (ref 22–32)
Calcium: 8.2 mg/dL — ABNORMAL LOW (ref 8.9–10.3)
Chloride: 108 mmol/L (ref 98–111)
Creatinine, Ser: 0.78 mg/dL (ref 0.61–1.24)
GFR calc Af Amer: >60 mL/min (ref >60)
GFR calc non Af Amer: >60 mL/min (ref >60)
Glucose, Bld: 113 mg/dL — ABNORMAL HIGH (ref 70–99)
Potassium: 3.7 mmol/L (ref 3.5–5.1)
Sodium: 139 mmol/L (ref 135–145)

## 2019-04-06 LAB — POCT I-STAT 7, (LYTES, BLD GAS, ICA,H+H)
Acid-base deficit: 2 mmol/L (ref 0.0–2.0)
Bicarbonate: 23.7 mmol/L (ref 20.0–28.0)
Bicarbonate: 23.7 mmol/L (ref 20.0–28.0)
Calcium, Ion: 1.24 mmol/L (ref 1.15–1.40)
Calcium, Ion: 1.21 mmol/L (ref 1.15–1.40)
HCT: 23 % — ABNORMAL LOW (ref 39.0–52.0)
HCT: 22 % — ABNORMAL LOW (ref 39.0–52.0)
Hemoglobin: 7.8 g/dL — ABNORMAL LOW (ref 13.0–17.0)
Hemoglobin: 7.5 g/dL — ABNORMAL LOW (ref 13.0–17.0)
O2 Saturation: 100 %
O2 Saturation: 100 %
Patient temperature: 38
Potassium: 3.8 mmol/L (ref 3.5–5.1)
Potassium: 4.1 mmol/L (ref 3.5–5.1)
Sodium: 141 mmol/L (ref 135–145)
Sodium: 141 mmol/L (ref 135–145)
TCO2: 25 mmol/L (ref 22–32)
TCO2: 25 mmol/L (ref 22–32)
pCO2 arterial: 47.4 mmHg (ref 32.0–48.0)
pCO2 arterial: 34.7 mmHg (ref 32.0–48.0)
pH, Arterial: 7.311 — ABNORMAL LOW (ref 7.350–7.450)
pH, Arterial: 7.443 (ref 7.350–7.450)
pO2, Arterial: 212 mmHg — ABNORMAL HIGH (ref 83.0–108.0)
pO2, Arterial: 203 mmHg — ABNORMAL HIGH (ref 83.0–108.0)

## 2019-04-06 LAB — CBC
HCT: 22.6 % — ABNORMAL LOW (ref 39.0–52.0)
HCT: 26.5 % — ABNORMAL LOW (ref 39.0–52.0)
Hemoglobin: 7.2 g/dL — ABNORMAL LOW (ref 13.0–17.0)
Hemoglobin: 8.6 g/dL — ABNORMAL LOW (ref 13.0–17.0)
MCH: 30 pg (ref 26.0–34.0)
MCH: 30.7 pg (ref 26.0–34.0)
MCHC: 31.9 g/dL (ref 30.0–36.0)
MCHC: 32.5 g/dL (ref 30.0–36.0)
MCV: 94.2 fL (ref 80.0–100.0)
MCV: 94.6 fL (ref 80.0–100.0)
Platelets: 166 10*3/uL (ref 150–400)
Platelets: 175 10*3/uL (ref 150–400)
RBC: 2.4 MIL/uL — ABNORMAL LOW (ref 4.22–5.81)
RBC: 2.8 MIL/uL — ABNORMAL LOW (ref 4.22–5.81)
RDW: 14.7 % (ref 11.5–15.5)
RDW: 14.7 % (ref 11.5–15.5)
WBC: 8 10*3/uL (ref 4.0–10.5)
WBC: 9.3 10*3/uL (ref 4.0–10.5)
nRBC: 0 % (ref 0.0–0.2)
nRBC: 0.2 % (ref 0.0–0.2)

## 2019-04-06 LAB — GLUCOSE, CAPILLARY
Glucose-Capillary: 102 mg/dL — ABNORMAL HIGH (ref 70–99)
Glucose-Capillary: 102 mg/dL — ABNORMAL HIGH (ref 70–99)
Glucose-Capillary: 110 mg/dL — ABNORMAL HIGH (ref 70–99)
Glucose-Capillary: 111 mg/dL — ABNORMAL HIGH (ref 70–99)
Glucose-Capillary: 113 mg/dL — ABNORMAL HIGH (ref 70–99)
Glucose-Capillary: 128 mg/dL — ABNORMAL HIGH (ref 70–99)

## 2019-04-06 LAB — PROCALCITONIN: Procalcitonin: <0.1 ng/mL

## 2019-04-06 LAB — COOXEMETRY PANEL
Carboxyhemoglobin: 1.8 % — ABNORMAL HIGH (ref 0.5–1.5)
Methemoglobin: 1.1 % (ref 0.0–1.5)
O2 Saturation: 58.9 %
Total hemoglobin: 7.6 g/dL — ABNORMAL LOW (ref 12.0–16.0)

## 2019-04-06 LAB — LACTATE DEHYDROGENASE: LDH: 349 U/L — ABNORMAL HIGH (ref 98–192)

## 2019-04-06 LAB — PREPARE RBC (CROSSMATCH)

## 2019-04-06 LAB — MAGNESIUM: Magnesium: 1.8 mg/dL (ref 1.7–2.4)

## 2019-04-06 LAB — TRIGLYCERIDES: Triglycerides: 111 mg/dL (ref <150)

## 2019-04-06 LAB — DIGOXIN LEVEL: Digoxin Level: <0.2 ng/mL — ABNORMAL LOW (ref 0.8–2.0)

## 2019-04-06 MED ORDER — MAGNESIUM SULFATE 2 GM/50ML IV SOLN
2.0000 g | Freq: Once | INTRAVENOUS | Status: AC
  Administered 2019-04-06: 12:00:00 2 g via INTRAVENOUS
  Filled 2019-04-06: qty 50

## 2019-04-06 MED ORDER — IPRATROPIUM-ALBUTEROL 0.5-2.5 (3) MG/3ML IN SOLN
3.0000 mL | Freq: Four times a day (QID) | RESPIRATORY_TRACT | Status: DC
  Administered 2019-04-07 (×4): 3 mL via RESPIRATORY_TRACT
  Filled 2019-04-06 (×4): qty 3

## 2019-04-06 MED ORDER — MIDAZOLAM HCL 2 MG/2ML IJ SOLN
INTRAMUSCULAR | Status: AC
  Filled 2019-04-06: qty 2

## 2019-04-06 MED ORDER — SODIUM CHLORIDE 0.9 % IV SOLN
2.0000 g | Freq: Three times a day (TID) | INTRAVENOUS | Status: AC
  Administered 2019-04-06 – 2019-04-09 (×11): 2 g via INTRAVENOUS
  Filled 2019-04-06 (×14): qty 2

## 2019-04-06 MED ORDER — FENTANYL 2500MCG IN NS 250ML (10MCG/ML) PREMIX INFUSION
0.0000 ug/h | INTRAVENOUS | Status: DC
  Administered 2019-04-06: 250 ug/h via INTRAVENOUS
  Administered 2019-04-06: 300 ug/h via INTRAVENOUS
  Administered 2019-04-07: 200 ug/h via INTRAVENOUS
  Filled 2019-04-06 (×3): qty 250

## 2019-04-06 MED ORDER — POTASSIUM CHLORIDE 10 MEQ/50ML IV SOLN
10.0000 meq | INTRAVENOUS | Status: AC
  Administered 2019-04-06 (×2): 10 meq via INTRAVENOUS
  Filled 2019-04-06 (×2): qty 50

## 2019-04-06 MED ORDER — ETOMIDATE 2 MG/ML IV SOLN: 20.0000 mg | Freq: Once | INTRAVENOUS | Status: DC

## 2019-04-06 MED ORDER — PROPOFOL 1000 MG/100ML IV EMUL
5.0000 ug/kg/min | INTRAVENOUS | Status: DC
  Administered 2019-04-06: 06:00:00 10 ug/kg/min via INTRAVENOUS
  Filled 2019-04-06: qty 100

## 2019-04-06 MED ORDER — IPRATROPIUM-ALBUTEROL 0.5-2.5 (3) MG/3ML IN SOLN
3.0000 mL | Freq: Four times a day (QID) | RESPIRATORY_TRACT | Status: DC
  Administered 2019-04-06 (×3): 3 mL via RESPIRATORY_TRACT
  Filled 2019-04-06 (×3): qty 3

## 2019-04-06 MED ORDER — FENTANYL CITRATE (PF) 100 MCG/2ML IJ SOLN: 100.0000 ug | Freq: Once | INTRAMUSCULAR | Status: DC

## 2019-04-06 MED ORDER — MIDAZOLAM HCL 2 MG/2ML IJ SOLN
INTRAMUSCULAR | Status: AC
  Administered 2019-04-06: 05:00:00 2 mg
  Filled 2019-04-06: qty 2

## 2019-04-06 MED ORDER — SODIUM CHLORIDE 0.9% IV SOLUTION: Freq: Once | INTRAVENOUS | Status: DC

## 2019-04-06 MED ORDER — MIDAZOLAM HCL 2 MG/2ML IJ SOLN
2.0000 mg | Freq: Once | INTRAMUSCULAR | Status: AC
  Administered 2019-04-06: 2 mg via INTRAVENOUS

## 2019-04-06 MED ORDER — IPRATROPIUM-ALBUTEROL 0.5-2.5 (3) MG/3ML IN SOLN: 3.0000 mL | Freq: Four times a day (QID) | RESPIRATORY_TRACT | Status: DC | PRN

## 2019-04-06 MED ORDER — MIDAZOLAM HCL 2 MG/2ML IJ SOLN
2.0000 mg | INTRAMUSCULAR | Status: AC | PRN
  Administered 2019-04-06 (×3): 2 mg via INTRAVENOUS
  Filled 2019-04-06 (×3): qty 2

## 2019-04-06 MED ORDER — MIDAZOLAM HCL 2 MG/2ML IJ SOLN
2.0000 mg | INTRAMUSCULAR | Status: DC | PRN
  Administered 2019-04-06 – 2019-04-07 (×7): 2 mg via INTRAVENOUS
  Filled 2019-04-06 (×7): qty 2

## 2019-04-06 NOTE — Progress Notes (Signed)
Called to bedside for CPR in progress.  Prior to my arrival the patient developed pulseless V-tach and returned to sinus with 27m epi x2 and 2 cardioversion.  Patient was poorly responsive with poor respiratory effort and was urgently intubated.  VSS throughout.  CXR with adequate tube position.  Restarted on fentanyl, difficult to sedate.  ABG with pH 7.35, P02 in the 200s.  Hemoglobin has steadily dropped since yesterday to tonight and I will transfuse 1 unit PRBCs, now hgb is 7.2.

## 2019-04-06 NOTE — Progress Notes (Signed)
Advanced Heart Failure Rounding Note  PCP-Cardiologist: No primary care provider on file.   Subjective:    Extubated yesterday. Had torsades overnight requiring shock and re-intubation.   Agitated on vent but will follow some commands.   Rhythm now stable on IV amio. On NE at 3 and milrinone 0.125. Hgb 7.2  Swan RA 10 PA 20/12 Thermo 7.4/3.7 SVR 700  Objective:   Weight Range: 91 kg Body mass index is 29.61 kg/m.   Vital Signs:   Temp:  [97.9 F (36.6 C)-99.9 F (37.7 C)] 98.2 F (36.8 C) (08/15 0847) Pulse Rate:  [45-97] 61 (08/15 0847) Resp:  [13-41] 19 (08/15 0847) BP: (81-190)/(38-93) 99/59 (08/15 0847) SpO2:  [81 %-100 %] 100 % (08/15 0847) Arterial Line BP: (62-249)/(34-120) 126/66 (08/15 0847) FiO2 (%):  [40 %-100 %] (P) 70 % (08/15 0847) Weight:  [91 kg] 91 kg (08/15 0600) Last BM Date: (PTA)  Weight change: Filed Weights   04/04/19 0500 04/05/19 0400 04/06/19 0600  Weight: 92.6 kg 92.3 kg 91 kg    Intake/Output:   Intake/Output Summary (Last 24 hours) at 04/06/2019 0903 Last data filed at 04/06/2019 0847 Gross per 24 hour  Intake 1448.95 ml  Output 2675 ml  Net -1226.05 ml      Physical Exam   General:  Intubated sedated HEENT: ETT Neck: supple. no JVD. Carotids 2+ bilat; no bruits. No lymphadenopathy or thryomegaly appreciated. Cor: PMI nondisplaced. Regular rate & rhythm. No rubs, gallops or murmurs. Lungs: coarse Abdomen: obese soft, nontender, nondistended. No hepatosplenomegaly. No bruits or masses. Good bowel sounds. Extremities: no cyanosis, clubbing, rash, edema R groin swan Neuro: intubated/sedated   Telemetry   SR 60s with torsades overnight Personally reviewed  EKG    N/a   Labs    CBC Recent Labs    04/05/19 1540 04/06/19 0347  WBC 8.5 8.0  HGB 8.0* 7.2*  HCT 24.3* 22.6*  MCV 92.7 94.2  PLT 138* 350   Basic Metabolic Panel Recent Labs    04/04/19 0740 04/05/19 0345 04/06/19 0347  NA  --  135 139  K   --  4.1 3.7  CL  --  104 108  CO2  --  22 23  GLUCOSE  --  149* 113*  BUN  --  28* 18  CREATININE  --  0.85 0.78  CALCIUM  --  8.6* 8.2*  MG 1.9  --  1.8   Liver Function Tests No results for input(s): AST, ALT, ALKPHOS, BILITOT, PROT, ALBUMIN in the last 72 hours. No results for input(s): LIPASE, AMYLASE in the last 72 hours. Cardiac Enzymes No results for input(s): CKTOTAL, CKMB, CKMBINDEX, TROPONINI in the last 72 hours.  BNP: BNP (last 3 results) Recent Labs    03/31/19 0013  BNP 1,247.4*    ProBNP (last 3 results) No results for input(s): PROBNP in the last 8760 hours.   D-Dimer No results for input(s): DDIMER in the last 72 hours. Hemoglobin A1C No results for input(s): HGBA1C in the last 72 hours. Fasting Lipid Panel No results for input(s): CHOL, HDL, LDLCALC, TRIG, CHOLHDL, LDLDIRECT in the last 72 hours. Thyroid Function Tests No results for input(s): TSH, T4TOTAL, T3FREE, THYROIDAB in the last 72 hours.  Invalid input(s): FREET3  Other results:   Imaging    Portable Chest X-ray  Result Date: 04/06/2019 CLINICAL DATA:  Intubation EXAM: PORTABLE CHEST 1 VIEW COMPARISON:  04/05/2019 FINDINGS: Support Apparatus: --Endotracheal tube: Tip at the level of the clavicular  heads. --Enteric tube:Tip and sideport are below the field of view. --Catheter(s):Left internal jugular vein approach central venous catheter tip is at the mid SVC. A second catheter from an inferior approach terminates over the right pulmonary artery. --Other: None The heart size and mediastinal contours are within normal limits. The lungs are clear. No pleural effusion or pneumothorax. IMPRESSION: Unchanged support apparatus. Electronically Signed   By: Ulyses Jarred M.D.   On: 04/06/2019 05:56     Medications:     Scheduled Medications: . sodium chloride   Intravenous Once  . aspirin  81 mg Per Tube Daily  . atorvastatin  80 mg Per Tube q1800  . chlorhexidine gluconate (MEDLINE KIT)  15  mL Mouth Rinse BID  . Chlorhexidine Gluconate Cloth  6 each Topical Daily  . digoxin  0.125 mg Per Tube Daily  . etomidate  20 mg Intravenous Once  . feeding supplement (PRO-STAT SUGAR FREE 64)  30 mL Per Tube TID  . fentaNYL (SUBLIMAZE) injection  100 mcg Intravenous Once  . heparin injection (subcutaneous)  5,000 Units Subcutaneous Q8H  . insulin aspart  0-9 Units Subcutaneous Q4H  . insulin aspart  3 Units Subcutaneous Q4H  . insulin glargine  15 Units Subcutaneous Daily  . ipratropium-albuterol  3 mL Nebulization Q6H  . mouth rinse  15 mL Mouth Rinse 10 times per day  . midazolam      . pantoprazole sodium  40 mg Per Tube Daily  . sodium chloride flush  10-40 mL Intracatheter Q12H  . spironolactone  12.5 mg Per Tube Daily  . Thrombi-Pad  1 each Topical Once  . ticagrelor  90 mg Per Tube BID    Infusions: . sodium chloride 10 mL/hr at 04/06/19 0700  . amiodarone 30 mg/hr (04/06/19 0700)  . feeding supplement (VITAL 1.5 CAL) Stopped (04/04/19 1600)  . fentaNYL infusion INTRAVENOUS 250 mcg/hr (04/06/19 0700)  . meropenem (MERREM) IV Stopped (04/06/19 0330)  . milrinone 0.125 mcg/kg/min (04/06/19 0700)  . norepinephrine (LEVOPHED) Adult infusion 3 mcg/min (04/06/19 0700)  . potassium chloride    . propofol (DIPRIVAN) infusion 20 mcg/kg/min (04/06/19 0700)    PRN Medications: acetaminophen, ipratropium-albuterol, nitroGLYCERIN, ondansetron (ZOFRAN) IV, sodium chloride flush   *  Assessment/Plan    1. Cardiac arrest/VT/torsades on 8/15 - now reintubated. - supp K and Mg - cooling d/w CCM and felt that not candidate for cooling due to intact neuro status - if recovers will need Lifevest vs ICD prior to ICD  2. Acute systolic HF -> Cardiogenic shock due to acute anterior MI - s/p PCI/DES to LAD on 8/9 - Impella removed 8/12 - On NE 3 mcg. Milrinone 0.125. Co-ox 59% Swan numbers ok - Echo 20-25% - Can likely pull swan tomorrow  3. CAD with acute anterior MI 8/9 - s/p  PCI/DES to LAD. 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 Acute hypoxic respiratory failure - in setting of #1 - extubated 8/14. Reintubated 8/15 in setting of torsades - CCM managing.   4. DM2 - new diagnosis - cover with SSI  5. COPD - ongoing tobacco use - needs cessation   6. NSVT/PVC - Improved on amio drip. Continue amio drip 30 mg per hour.  - Keep K> 4.0 Mg > 2.0  7. Anemia, acute blood loss - Hgb 7.2. transfuse today  CRITICAL CARE Performed by: [name]  Total critical care time: 45 minutes  Critical care time was exclusive of separately billable procedures and treating other patients.  Critical care was necessary to treat or prevent imminent or life-threatening deterioration.  Critical care was time spent personally by me (independent of midlevel providers or residents) on the following activities: development of treatment plan with patient and/or surrogate as well as nursing, discussions with consultants, evaluation of patient's response to treatment, examination of patient, obtaining history from patient or surrogate, ordering and performing treatments and interventions, ordering and review of laboratory studies, ordering and review of radiographic studies, pulse oximetry and re-evaluation of patient's condition.   Length of Stay: [place], MD  04/06/2019, 9:03 AM  Advanced Heart Failure Team Pager [phone] (M-F; [place])  Please contact [facility] for night-coverage after hours (4p -7a ) and weekends on [email or web address]

## 2019-04-06 NOTE — Plan of Care (Signed)
  Problem: Cardiac: Goal: Ability to achieve and maintain adequate cardiopulmonary perfusion will improve Outcome: Not Progressing  Pt with VT during night requiring defib and intubation

## 2019-04-06 NOTE — Progress Notes (Signed)
Responded to code blue page. Staff tending to pt. Nurse stated no family present at this time. I offered ministry of presence and silent prayer. Chaplain available as upon request.  Colonel Bald  [phone]

## 2019-04-06 NOTE — Progress Notes (Signed)
RT note: patient does not meet SBT criteria this AM.  Tolerating current ventilator settings well.  Will continue to monitor.  

## 2019-04-06 NOTE — Progress Notes (Signed)
NAME:  Stephen Jennings, MRN:  [id number], DOB:  1955-10-12, LOS: 6 ADMISSION DATE:  03/31/2019, CONSULTATION DATE:  03/31/2019 REFERRING MD:  Dr. Claiborne Billings, Cardiology, CHIEF COMPLAINT:  Acute respiratory failure, cardiogenic shock  Brief History   63M with no known PMHx who presented with respiratory distress via EMS, intubated in the ED.  Code STEMI was called and patient was taken to the cath lab with complete occlusion of the LAD with cardiogenic shock.  PCCM consulted for vent management and hemodynamic management.  History of present illness   Stephen Jennings is a 63 year old gentleman year old gentleman with no known PMHx who presented with respiratory distress via EMS. Patient's wife described that patient had episodes of chest pain/pressure and diaphoresis in the weeks leading up to presentation. On the day of admission, he was getting ready for bed and then became acutely dyspneic, at which point EMS was called. He was intubated in the ED. A code STEMI was called and patient was taken to the cath lab with complete occlusion of the LAD and subsequently developed cardiogenic shock. Impella device was placed and he was started on dual pressor support with norepinephrine and epinephrine. PCCM consulted for vent management and hemodynamic management.  Past Medical History  No known past medical history  Significant Hospital Events   03/31/19: Intubation; Anterior STEMI with 100% LAD occlusion, s/p PCI/DES, Impella  03/31/19: Code, CPR and 1 amp epi given with immediate ROSC   04/03/19: Impella removed  04/06/2019: Cardiac arrest, VT, defibrillation x2, 2 rounds of epi approximately 10 minutes CPR with ROSC  Consults:  Cardiology Heart Failure Team PCCM  Procedures:  03/31/19: ETT placed  03/31/19: PCI to LAD with DES placement.   Impella 03/31/19 >> 04/03/19  03/31/19: Left IJ TLC  03/31/19: Swan-Ganz catheter  04/06/2019: Intubation, CPR, defibrillation x2 for VT  Significant Diagnostic Tests:  03/31/19 Left heart cath:  Revealed 100% occlusion of LAD, 70% stenosis of RCA.  03/31/19 Echocardiogram: The left ventricle has severely reduced systolic function, with an ejection fraction of 20-25%. The cavity size was moderately dilated. Left ventricular diastolic function could not be evaluated. Septal apical and anterior wall hyppkinesis Impella device seen in LV extending to mid ventrical CW through AV suggests low output. The right ventricle has moderately reduced systolic function. The cavity was mildly enlarged. There is no increase in right ventricular wall thickness. Left atrial size was moderately dilated. Right atrial size was mildly dilated. The aortic valve was not well visualized. Impella device seen coursing across AV. The aorta is normal in size and structure. The interatrial septum was not well visualized   Micro Data:  MRSA PCR 8/9 >> negative SARS CoV 2 8/9 >> negative Blood Cx 8/9 >> no growth to date Urine Cx 8/9 >> no growth Sputum Cx 8/13 >> moderate pseudomonas (susceptibilities pending)  Antimicrobials:  Vancomycin (8/9) Cefepime (8/9-8/14) Meropenem (8/14- )  Interim history/subjective:   Overnight last night patient suffered cardiac arrest with ROSC.  Approximately 10 minutes of CPR.  Was found to be in VT.  Return to normal sinus after 2 defibrillations and 1 round of epi.  Patient was reintubated.  Objective   Blood pressure 95/60, pulse 70, temperature 98.2 F (36.8 C), resp. rate 19, height 5' 9.02" (1.753 m), weight 91 kg, SpO2 100 %. PAP: (20-55)/(7-45) 29/25 CVP:  [4 mmHg-19 mmHg] 12 mmHg PCWP:  [17 mmHg] 17 mmHg CO:  [5.2 L/min-7.4 L/min] 5.8 L/min CI:  [2.6 L/min/m2-3.7 L/min/m2] 2.9 L/min/m2  Vent Mode: PRVC FiO2 (%):  [  40 %-100 %] 70 % Set Rate:  [8 bmp-20 bmp] 20 bmp Vt Set:  [560 mL] 560 mL PEEP:  [5 cmH20] 5 cmH20 Plateau Pressure:  [14 cmH20-25 cmH20] 14 cmH20   Intake/Output Summary (Last 24 hours) at 04/06/2019 1042 Last data filed at 04/06/2019 1000 Gross  per 24 hour  Intake 1528.25 ml  Output 2725 ml  Net -1196.75 ml   Filed Weights   04/04/19 0500 04/05/19 0400 04/06/19 0600  Weight: 92.6 kg 92.3 kg 91 kg    Examination: General: Critically ill, intubated on mechanical ventilation. HENT: Endotracheal tube in place, left IJ Lungs: Bilateral ventilated breath sounds Cardiovascular: Regular rate and rhythm, S1-S2 Abdomen: Soft, nontender nondistended Extremities: No significant edema, Swan and right femoral sheath in place Neuro: Following commands, opens eyes to voice, moves all 4 extremities GU: Foley catheter in place  CXR:  Chest x-ray 04/06/2019: Swan-Ganz catheter, endotracheal tube in place. The patient's images have been independently reviewed by me.    Resolved Hospital Problem list     Assessment & Plan:  Stephen Jennings is a 63 year old gentleman with no known PMH who presented with acute respiratory distress via EMS, intubated in the ED. Code STEMI was called and patient was taken to the cath lab with complete occlusion of the LAD, subsequently developed cardiogenic shock. He is s/p PCI with DES of the LAD and Impella placement, requiring dual vasopressor support with norepinephrine and epinephrine. PCCM consulted for vent management and hemodynamic management.  Acute hypoxemic respiratory failure requiring intubation mechanical ventilation -Remains intubated on mechanical life support. -Continue daily SBT SAT -Continue sedation with fentanyl and PRN Versed - VAP precautions - Low tidal volume ventilation strategy -Sedation needs while in the ICU.  Stopped propofol. -Continue fentanyl infusion, PRN fentanyl bolus and PRN Versed  Cardiogenic Shock 2/2 anterior STEMI (8/9) Cardiac arrest on 04/06/2019, VT, defibrillation x2, CPR x10 minutes 2 rounds epinephrine - Remains on norepinephrine infusion, continue vasopressor support to maintain mean arterial pressure greater than 65 -Continue milrinone -Appreciate cardiology  input  Aspiration Pneumonia, sputum culture growing pseudomonas Sputum culture with Pseudomonas. -Complete 14-day course of antibiotics. -Restart cefepime, pansensitive  Anemia Hemoglobin increased to 9.0.  -Transfuse to maintain hemoglobin greater than 8  Anterior STEMI (8/9) s/p PCI/DES -Continue aspirin to Kegler lower atorvastatin Holding beta-blocker and ACE inhibitor  Non-sustained VT/PVC Improving with amiodarone.  -Amiodarone per advanced heart failure team -Maintain potassium greater than 4 and magnesium greater than 2  Renal -Continue to follow urine output  Thrombocytopenia -  Likely critical illness related -Also had Impella. - Improving slowly we will continue to observe  Hyperglycemia -Continue SSI and CBG  Best practice:  Diet: NPO Pain/Anxiety/Delirium protocol (if indicated): Yes VAP protocol (if indicated): Yes DVT prophylaxis: On heparin GI prophylaxis: Pantoprazole Glucose control: SSI Mobility: Bedrest Code Status: Full code Family Communication: Plan to update patient's family Disposition: ICU  Labs    CBC: Recent Labs  Lab 03/31/19 0013  03/31/19 0503  04/04/19 0429 04/04/19 1240 04/05/19 0345 04/05/19 1540 04/06/19 0347  WBC 15.0*  --  16.8*   < > 7.2 7.1 9.5 8.5 8.0  NEUTROABS 6.6  --  13.8*  --   --   --   --   --   --   HGB 17.0   < > 11.9*   < > 8.2* 8.3* 9.0* 8.0* 7.2*  HCT 52.6*   < > 37.4*   < > 25.4* 26.2* 27.7* 24.3* 22.6*  MCV 96.5  --  96.9   < > 94.4 95.6 94.2 92.7 94.2  PLT 388  --  314   < > 106* 113* 138* 138* 166   < > = values in this interval not displayed.    Basic Metabolic Panel: Recent Labs  Lab 04/01/19 0317  04/01/19 2109 04/02/19 0423  04/03/19 0455 04/03/19 0500 04/04/19 0406 04/04/19 0740 04/05/19 0345 04/06/19 0347  NA 140   < > 136 136   < > 135 137 135  --  135 139  K 3.6   < > 3.5 4.3   < > 4.3 4.3 4.5  --  4.1 3.7  CL 111  --  107 107  --  104  --  103  --  104 108  CO2 21*  --  22  21*  --  23  --  24  --  22 23  GLUCOSE 159*  --  149* 161*  --  156*  --  208*  --  149* 113*  BUN 18  --  19 19  --  21  --  23  --  28* 18  CREATININE 1.11  --  0.92 0.92  --  0.81  --  0.83  --  0.85 0.78  CALCIUM 7.5*  --  7.7* 8.1*  --  8.1*  --  7.8*  --  8.6* 8.2*  MG 2.0  --  1.7 2.0  --  1.6*  --   --  1.9  --  1.8  PHOS 2.2*  --   --  2.2*  --  3.4  --   --   --   --   --    < > = values in this interval not displayed.   GFR: Estimated Creatinine Clearance: 106.7 mL/min (by C-G formula based on SCr of 0.78 mg/dL). Recent Labs  Lab 03/31/19 0503  03/31/19 1205 03/31/19 1552  04/01/19 1312  04/04/19 1240 04/04/19 2320 04/05/19 0345 04/05/19 1540 04/06/19 0347  PROCALCITON  --   --   --   --   --   --   --   --  0.14 0.12  --  <0.10  WBC 16.8*   < >  --  14.0*   < >  --    < > 7.1  --  9.5 8.5 8.0  LATICACIDVEN 3.0*  --  3.5* 2.5*  --  1.4  --   --   --   --   --   --    < > = values in this interval not displayed.    Liver Function Tests: Recent Labs  Lab 03/31/19 0013 03/31/19 0503 04/01/19 0317 04/02/19 0423  AST 45* 55* 79* 47*  ALT 28 18 21 18   ALKPHOS 109 70 60 52  BILITOT 1.0 0.8 1.3* 0.9  PROT 7.8 4.5* 4.3* 4.9*  ALBUMIN 3.9 2.4* 2.4* 2.3*   No results for input(s): LIPASE, AMYLASE in the last 168 hours. No results for input(s): AMMONIA in the last 168 hours.  ABG    Component Value Date/Time   PHART 7.436 04/03/2019 0500   PCO2ART 36.0 04/03/2019 0500   PO2ART 73.0 (L) 04/03/2019 0500   HCO3 24.2 04/03/2019 0500   TCO2 25 04/03/2019 0500   ACIDBASEDEF 2.0 04/02/2019 0439   O2SAT 58.9 04/06/2019 0400     Coagulation Profile: Recent Labs  Lab 03/31/19 0503  INR >10.0*    Cardiac Enzymes: No results for input(s): CKTOTAL, CKMB, CKMBINDEX, TROPONINI in  the last 168 hours.  HbA1C: Hgb A1c MFr Bld  Date/Time Value Ref Range Status  03/31/2019 05:03 AM 6.0 (H) 4.8 - 5.6 % Final    Comment:    (NOTE) Pre diabetes:          5.7%-6.4%  Diabetes:              >6.4% Glycemic control for   <7.0% adults with diabetes     CBG: Recent Labs  Lab 04/05/19 1616 04/05/19 2124 04/05/19 2331 04/06/19 0338 04/06/19 0810  GLUCAP 122* 126* 122* 111* 113*    This patient is critically ill with multiple organ system failure; which, requires frequent high complexity decision making, assessment, support, evaluation, and titration of therapies. This was completed through the application of advanced monitoring technologies and extensive interpretation of multiple databases. During this encounter critical care time was devoted to patient care services described in this note for 34 minutes.   Garner Nash, DO [facility] 04/06/2019 10:42 AM  Personal pager: [phone] If unanswered, please page CCM On-call: [phone]

## 2019-04-06 NOTE — Procedures (Signed)
Intubation Procedure Note Stephen Jennings [id number] Dec 21, 1955  Procedure: Intubation Indications: Respiratory insufficiency  Procedure Details Consent: Unable to obtain consent because of emergent medical necessity. Time Out: Verified patient identification, verified procedure, site/side was marked, verified correct patient position, special equipment/implants available, medications/allergies/relevent history reviewed, required imaging and test results available.  Performed  Maximum sterile technique was used including gloves and mask.  3    Evaluation Hemodynamic Status: BP stable throughout; O2 sats: stable throughout Patient's Current Condition: stable Complications: No apparent complications Patient did tolerate procedure well. Chest X-ray ordered to verify placement.  CXR: tube position acceptable.   Shellia Cleverly 04/06/2019

## 2019-04-07 ENCOUNTER — Inpatient Hospital Stay (HOSPITAL_COMMUNITY): Payer: Self-pay

## 2019-04-07 LAB — CBC
HCT: 27.1 % — ABNORMAL LOW (ref 39.0–52.0)
HCT: 29.5 % — ABNORMAL LOW (ref 39.0–52.0)
Hemoglobin: 8.7 g/dL — ABNORMAL LOW (ref 13.0–17.0)
Hemoglobin: 9.5 g/dL — ABNORMAL LOW (ref 13.0–17.0)
MCH: 30.3 pg (ref 26.0–34.0)
MCH: 30.6 pg (ref 26.0–34.0)
MCHC: 32.1 g/dL (ref 30.0–36.0)
MCHC: 32.2 g/dL (ref 30.0–36.0)
MCV: 94.4 fL (ref 80.0–100.0)
MCV: 95.2 fL (ref 80.0–100.0)
Platelets: 214 10*3/uL (ref 150–400)
Platelets: 268 10*3/uL (ref 150–400)
RBC: 2.87 MIL/uL — ABNORMAL LOW (ref 4.22–5.81)
RBC: 3.1 MIL/uL — ABNORMAL LOW (ref 4.22–5.81)
RDW: 15.1 % (ref 11.5–15.5)
RDW: 15.4 % (ref 11.5–15.5)
WBC: 8.7 10*3/uL (ref 4.0–10.5)
WBC: 9.3 10*3/uL (ref 4.0–10.5)
nRBC: 0 % (ref 0.0–0.2)
nRBC: 0.2 % (ref 0.0–0.2)

## 2019-04-07 LAB — COOXEMETRY PANEL
Carboxyhemoglobin: 1.8 % — ABNORMAL HIGH (ref 0.5–1.5)
Methemoglobin: 1.2 % (ref 0.0–1.5)
O2 Saturation: 61.8 %
Total hemoglobin: 8.7 g/dL — ABNORMAL LOW (ref 12.0–16.0)

## 2019-04-07 LAB — GLUCOSE, CAPILLARY
Glucose-Capillary: 110 mg/dL — ABNORMAL HIGH (ref 70–99)
Glucose-Capillary: 117 mg/dL — ABNORMAL HIGH (ref 70–99)
Glucose-Capillary: 141 mg/dL — ABNORMAL HIGH (ref 70–99)
Glucose-Capillary: 143 mg/dL — ABNORMAL HIGH (ref 70–99)
Glucose-Capillary: 124 mg/dL — ABNORMAL HIGH (ref 70–99)

## 2019-04-07 LAB — TYPE AND SCREEN
ABO/RH(D): O POS
Antibody Screen: NEGATIVE
Unit division: 0

## 2019-04-07 LAB — BPAM RBC
Blood Product Expiration Date: 202009092359
ISSUE DATE / TIME: 202008150802
Unit Type and Rh: 5100

## 2019-04-07 LAB — BASIC METABOLIC PANEL
Anion gap: 9 (ref 5–15)
BUN: 23 mg/dL (ref 8–23)
CO2: 26 mmol/L (ref 22–32)
Calcium: 8.6 mg/dL — ABNORMAL LOW (ref 8.9–10.3)
Chloride: 107 mmol/L (ref 98–111)
Creatinine, Ser: 0.79 mg/dL (ref 0.61–1.24)
GFR calc Af Amer: >60 mL/min (ref >60)
GFR calc non Af Amer: >60 mL/min (ref >60)
Glucose, Bld: 127 mg/dL — ABNORMAL HIGH (ref 70–99)
Potassium: 4.1 mmol/L (ref 3.5–5.1)
Sodium: 142 mmol/L (ref 135–145)

## 2019-04-07 LAB — LACTATE DEHYDROGENASE: LDH: 336 U/L — ABNORMAL HIGH (ref 98–192)

## 2019-04-07 MED ORDER — MIDAZOLAM HCL 2 MG/2ML IJ SOLN
2.0000 mg | INTRAMUSCULAR | Status: DC | PRN
  Administered 2019-04-07: 10:00:00 2 mg via INTRAVENOUS
  Filled 2019-04-07 (×2): qty 2

## 2019-04-07 MED ORDER — DIGOXIN 125 MCG PO TABS
0.1250 mg | ORAL_TABLET | Freq: Every day | ORAL | Status: DC
  Administered 2019-04-08 – 2019-04-16 (×9): 0.125 mg via ORAL
  Filled 2019-04-07 (×10): qty 1

## 2019-04-07 MED ORDER — ORAL CARE MOUTH RINSE
15.0000 mL | Freq: Two times a day (BID) | OROMUCOSAL | Status: DC
  Administered 2019-04-08 – 2019-04-15 (×11): 15 mL via OROMUCOSAL

## 2019-04-07 MED ORDER — CHLORHEXIDINE GLUCONATE 0.12 % MT SOLN
15.0000 mL | Freq: Two times a day (BID) | OROMUCOSAL | Status: DC
  Administered 2019-04-08 – 2019-04-16 (×14): 15 mL via OROMUCOSAL
  Filled 2019-04-07 (×13): qty 15

## 2019-04-07 MED ORDER — SORBITOL 70 % PO SOLN
30.0000 mL | Freq: Once | ORAL | Status: AC
  Administered 2019-04-07: 11:00:00 30 mL
  Filled 2019-04-07: qty 30

## 2019-04-07 MED ORDER — FUROSEMIDE 10 MG/ML IJ SOLN
40.0000 mg | Freq: Once | INTRAMUSCULAR | Status: AC
  Administered 2019-04-07: 10:00:00 40 mg via INTRAVENOUS
  Filled 2019-04-07: qty 4

## 2019-04-07 MED ORDER — SPIRONOLACTONE 12.5 MG HALF TABLET
12.5000 mg | ORAL_TABLET | Freq: Every day | ORAL | Status: DC
  Administered 2019-04-08: 11:00:00 12.5 mg via ORAL
  Filled 2019-04-07: qty 1

## 2019-04-07 MED ORDER — ASPIRIN 81 MG PO CHEW
81.0000 mg | CHEWABLE_TABLET | Freq: Every day | ORAL | Status: DC
  Administered 2019-04-08 – 2019-04-16 (×9): 81 mg via ORAL
  Filled 2019-04-07 (×9): qty 1

## 2019-04-07 MED ORDER — PANTOPRAZOLE SODIUM 40 MG PO PACK
40.0000 mg | PACK | Freq: Every day | ORAL | Status: DC
  Filled 2019-04-07: qty 20

## 2019-04-07 MED ORDER — TICAGRELOR 90 MG PO TABS
90.0000 mg | ORAL_TABLET | Freq: Two times a day (BID) | ORAL | Status: DC
  Administered 2019-04-07 – 2019-04-16 (×18): 90 mg via ORAL
  Filled 2019-04-07 (×18): qty 1

## 2019-04-07 MED ORDER — ATORVASTATIN CALCIUM 80 MG PO TABS
80.0000 mg | ORAL_TABLET | Freq: Every day | ORAL | Status: DC
  Administered 2019-04-08 – 2019-04-15 (×8): 80 mg via ORAL
  Filled 2019-04-07 (×8): qty 1

## 2019-04-07 MED ORDER — HALOPERIDOL LACTATE 5 MG/ML IJ SOLN
2.0000 mg | INTRAMUSCULAR | Status: AC | PRN
  Administered 2019-04-07 – 2019-04-08 (×2): 2 mg via INTRAVENOUS
  Filled 2019-04-07 (×2): qty 1

## 2019-04-07 NOTE — Procedures (Signed)
Extubation Procedure Note  Patient Details:   Name: Stephen Jennings DOB: 05-Jan-1956 MRN: [id number]   Airway Documentation:    Vent end date: 04/07/19 Vent end time: 1145   Evaluation  O2 sats: stable throughout Complications: No apparent complications Patient did tolerate procedure well. Bilateral Breath Sounds: Clear, Diminished, Rhonchi   Yes   Patient extubated to 3L nasal cannula.  Positive cuff leak noted.  No evidence of stridor.  Patient able to speak post extubation.  Sats currently 95%.  Vitals are stable.  No complications noted.    Judith Part 04/07/2019, 12:08 PM

## 2019-04-07 NOTE — Progress Notes (Signed)
Pt throwing items in the room, pulling at lines, agitated in bed. Cards MD on call paged and made aware. Haldol given to pt per previous order. No changes.   Will continue to monitor patient.   Elza Sortor E [name], [place]

## 2019-04-07 NOTE — Progress Notes (Signed)
RT note: patient placed on CPAP/PSV 8/5 at 0800.  Currently tolerating well.  Will continue to monitor.

## 2019-04-07 NOTE — Progress Notes (Signed)
Advanced Heart Failure Rounding Note  PCP-Cardiologist: No primary care provider on file.   Subjective:    Extubated 8/14. Had torsades 8/15 requiring shock and re-intubation.   Now awake on vent. Following all commands. Wants tube out.   Rhythm now stable on IV amio. NE off. On milrinone 0.125. Hgb 8.7 (got 1u RBC yesterday) co-ox 62%  Swan RA 12 PA 26/20 Thermo 5.7/2.9  Objective:   Weight Range: 91.2 kg Body mass index is 29.68 kg/m.   Vital Signs:   Temp:  [98.1 F (36.7 C)-99 F (37.2 C)] 98.4 F (36.9 C) (08/16 0900) Pulse Rate:  [67-86] 78 (08/16 0900) Resp:  [15-32] 15 (08/16 0900) BP: (95-181)/(50-97) 115/64 (08/16 0900) SpO2:  [96 %-100 %] 98 % (08/16 0900) Arterial Line BP: (112-174)/(45-81) 145/50 (08/16 0900) FiO2 (%):  [40 %-55 %] 40 % (08/16 0800) Weight:  [91.2 kg] 91.2 kg (08/16 0500) Last BM Date: (PTA)  Weight change: Filed Weights   04/05/19 0400 04/06/19 0600 04/07/19 0500  Weight: 92.3 kg 91 kg 91.2 kg    Intake/Output:   Intake/Output Summary (Last 24 hours) at 04/07/2019 0946 Last data filed at 04/07/2019 0800 Gross per 24 hour  Intake 1631.9 ml  Output 1275 ml  Net 356.9 ml      Physical Exam   General:  Intubated awake on vent.. Following commands HEENT: ETT Neck: supple.JVP 10 LIJ TLC Carotids 2+ bilat; no bruits. No lymphadenopathy or thryomegaly appreciated. Cor: PMI nondisplaced. Regular rate & rhythm. No rubs, gallops or murmurs. Lungs: coarse Abdomen: soft, nontender, mildly distended. No hepatosplenomegaly. No bruits or masses. Hypoactive bowel sounds. Extremities: no cyanosis, clubbing, rash, edema RFV san Neuro: alert & orientedx3, cranial nerves grossly intact. moves all 4 extremities w/o difficulty. Affect pleasant   Telemetry   SR 70s  Personally reviewed  EKG    N/a   Labs    CBC Recent Labs    04/06/19 1317 04/07/19 0026  WBC 9.3 8.7  HGB 8.6* 8.7*  HCT 26.5* 27.1*  MCV 94.6 94.4  PLT 175  767   Basic Metabolic Panel Recent Labs    04/06/19 0347  04/06/19 1033 04/07/19 0515  NA 139   < > 141 142  K 3.7   < > 4.1 4.1  CL 108  --   --  107  CO2 23  --   --  26  GLUCOSE 113*  --   --  127*  BUN 18  --   --  23  CREATININE 0.78  --   --  0.79  CALCIUM 8.2*  --   --  8.6*  MG 1.8  --   --   --    < > = values in this interval not displayed.   Liver Function Tests No results for input(s): AST, ALT, ALKPHOS, BILITOT, PROT, ALBUMIN in the last 72 hours. No results for input(s): LIPASE, AMYLASE in the last 72 hours. Cardiac Enzymes No results for input(s): CKTOTAL, CKMB, CKMBINDEX, TROPONINI in the last 72 hours.  BNP: BNP (last 3 results) Recent Labs    03/31/19 0013  BNP 1,247.4*    ProBNP (last 3 results) No results for input(s): PROBNP in the last 8760 hours.   D-Dimer No results for input(s): DDIMER in the last 72 hours. Hemoglobin A1C No results for input(s): HGBA1C in the last 72 hours. Fasting Lipid Panel Recent Labs    04/06/19 0347  TRIG 111   Thyroid Function Tests No results  for input(s): TSH, T4TOTAL, T3FREE, THYROIDAB in the last 72 hours.  Invalid input(s): FREET3  Other results:   Imaging    No results found.   Medications:     Scheduled Medications: . sodium chloride   Intravenous Once  . aspirin  81 mg Per Tube Daily  . atorvastatin  80 mg Per Tube q1800  . chlorhexidine gluconate (MEDLINE KIT)  15 mL Mouth Rinse BID  . Chlorhexidine Gluconate Cloth  6 each Topical Daily  . digoxin  0.125 mg Per Tube Daily  . etomidate  20 mg Intravenous Once  . feeding supplement (PRO-STAT SUGAR FREE 64)  30 mL Per Tube TID  . fentaNYL (SUBLIMAZE) injection  100 mcg Intravenous Once  . heparin injection (subcutaneous)  5,000 Units Subcutaneous Q8H  . insulin aspart  0-9 Units Subcutaneous Q4H  . insulin aspart  3 Units Subcutaneous Q4H  . insulin glargine  15 Units Subcutaneous Daily  . ipratropium-albuterol  3 mL Nebulization QID   . mouth rinse  15 mL Mouth Rinse 10 times per day  . pantoprazole sodium  40 mg Per Tube Daily  . sodium chloride flush  10-40 mL Intracatheter Q12H  . spironolactone  12.5 mg Per Tube Daily  . Thrombi-Pad  1 each Topical Once  . ticagrelor  90 mg Per Tube BID    Infusions: . sodium chloride Stopped (04/06/19 0930)  . amiodarone 30 mg/hr (04/07/19 0800)  . ceFEPime (MAXIPIME) IV Stopped (04/07/19 0700)  . feeding supplement (VITAL 1.5 CAL) Stopped (04/04/19 1600)  . fentaNYL infusion INTRAVENOUS 300 mcg/hr (04/07/19 0800)  . milrinone 0.125 mcg/kg/min (04/07/19 0800)  . norepinephrine (LEVOPHED) Adult infusion Stopped (04/06/19 0933)    PRN Medications: acetaminophen, ipratropium-albuterol, midazolam, nitroGLYCERIN, ondansetron (ZOFRAN) IV, sodium chloride flush   *  Assessment/Plan    1. Cardiac arrest/VT/torsades on 8/15 - now reintubated. - rhythm stable on IV amio - Keep K> 4.0 Mg > 2.0 - if recovers will need Lifevest vs ICD prior to ICD  2. Acute systolic HF -> Cardiogenic shock due to acute anterior MI - s/p PCI/DES to LAD on 8/9 - Impella removed 8/12 - Off NE Milrinone 0.125. Co-ox 62% Swan numbers ok - Echo 20-25% - Pull swan - Give one dose IV lasix  3. CAD with acute anterior MI 8/9 - s/p PCI/DES to LAD. 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 Acute hypoxic respiratory failure - in setting of #1 - extubated 8/14. Reintubated 8/15 in setting of torsades - CCM managing. Hopefully we can extubate today  4. DM2 - new diagnosis - cover with SSI  5. COPD - ongoing tobacco use - needs cessation   6. NSVT/Torsades - Improved on amio drip. Continue amio drip 30 mg per hour.  - Keep K> 4.0 Mg > 2.0  7. Anemia, acute blood loss - Got 1u RBC on 8/15. Hgb 8.7 today  8. Possible ileus.  - check KUB - give sorbitol   CRITICAL CARE Performed by: [name]  Total critical care time: 35 minutes  Critical care time was exclusive of separately billable procedures and treating other patients.  Critical care was necessary to treat or prevent imminent or life-threatening deterioration.  Critical care was time spent personally by me (independent of midlevel providers or residents) on the following activities: development of treatment plan with patient and/or surrogate as well as nursing, discussions with consultants, evaluation of  patient's response to treatment, examination of patient, obtaining history from patient or surrogate, ordering and performing treatments and interventions, ordering and review of laboratory studies, ordering and review of radiographic studies, pulse oximetry and re-evaluation of patient's condition.   Length of Stay: [place], MD  04/07/2019, 9:46 AM  Advanced Heart Failure Team Pager [phone] (M-F; 7a - 4p)  Please contact [facility] for night-coverage after hours (4p -7a ) and weekends on [email or web address]

## 2019-04-07 NOTE — Progress Notes (Signed)
NAME:  Stephen Jennings, MRN:  [id number], DOB:  03-Sep-1955, LOS: 7 ADMISSION DATE:  03/31/2019, CONSULTATION DATE:  03/31/2019 REFERRING MD:  Dr. Claiborne Billings, Cardiology, CHIEF COMPLAINT:  Acute respiratory failure, cardiogenic shock  Brief History   15M with no known PMHx who presented with respiratory distress via EMS, intubated in the ED.  Code STEMI was called and patient was taken to the cath lab with complete occlusion of the LAD with cardiogenic shock.  PCCM consulted for vent management and hemodynamic management.  History of present illness   Stephen Jennings is a 63 year old gentleman with no known PMHx who presented with respiratory distress via EMS. Patient's wife described that patient had episodes of chest pain/pressure and diaphoresis in the weeks leading up to presentation. On the day of admission, he was getting ready for bed and then became acutely dyspneic, at which point EMS was called. He was intubated in the ED. A code STEMI was called and patient was taken to the cath lab with complete occlusion of the LAD and subsequently developed cardiogenic shock. Impella device was placed and he was started on dual pressor support with norepinephrine and epinephrine. PCCM consulted for vent management and hemodynamic management.  Past Medical History  No known past medical history  Significant Hospital Events   03/31/19: Intubation; Anterior STEMI with 100% LAD occlusion, s/p PCI/DES, Impella  03/31/19: Code, CPR and 1 amp epi given with immediate ROSC   04/03/19: Impella removed  04/06/2019: Cardiac arrest, VT, defibrillation x2, 2 rounds of epi approximately 10 minutes CPR with ROSC  Consults:  Cardiology Heart Failure Team PCCM  Procedures:  03/31/19: ETT placed  03/31/19: PCI to LAD with DES placement.   Impella 03/31/19 >> 04/03/19  03/31/19: Left IJ TLC  03/31/19: Swan-Ganz catheter  04/06/2019: Intubation, CPR, defibrillation x2 for VT  Significant Diagnostic Tests:  03/31/19 Left heart cath:  Revealed 100% occlusion of LAD, 70% stenosis of RCA.  03/31/19 Echocardiogram: The left ventricle has severely reduced systolic function, with an ejection fraction of 20-25%. The cavity size was moderately dilated. Left ventricular diastolic function could not be evaluated. Septal apical and anterior wall hyppkinesis Impella device seen in LV extending to mid ventrical CW through AV suggests low output. The right ventricle has moderately reduced systolic function. The cavity was mildly enlarged. There is no increase in right ventricular wall thickness. Left atrial size was moderately dilated. Right atrial size was mildly dilated. The aortic valve was not well visualized. Impella device seen coursing across AV. The aorta is normal in size and structure. The interatrial septum was not well visualized   Micro Data:  MRSA PCR 8/9 >> negative SARS CoV 2 8/9 >> negative Blood Cx 8/9 >> no growth to date Urine Cx 8/9 >> no growth Sputum Cx 8/13 >> moderate pseudomonas (susceptibilities pending)  Antimicrobials:  Vancomycin (8/9) Cefepime (8/9-8/14) Meropenem (8/14- )  Interim history/subjective:   Critically ill intubated on mechanical life support remains in the intensive care unit  Objective   Blood pressure 115/64, pulse 78, temperature 98.4 F (36.9 C), resp. rate 15, height 5' 9.02" (1.753 m), weight 91.2 kg, SpO2 98 %. PAP: (20-38)/(13-31) 26/20 CVP:  [12 mmHg] 12 mmHg CO:  [5.7 L/min] 5.7 L/min CI:  [2.9 L/min/m2] 2.9 L/min/m2  Vent Mode: PSV;CPAP FiO2 (%):  [40 %-55 %] 40 % Set Rate:  [20 bmp] 20 bmp Vt Set:  [560 mL] 560 mL PEEP:  [5 cmH20] 5 cmH20 Pressure Support:  [8 [place]  Pressure:  [14 cmH20-16 cmH20] 16 cmH20   Intake/Output Summary (Last 24 hours) at 04/07/2019 0913 Last data filed at 04/07/2019 0800 Gross per 24 hour  Intake 1631.9 ml  Output 1275 ml  Net 356.9 ml   Filed Weights   04/05/19 0400 04/06/19 0600 04/07/19 0500  Weight: 92.3 kg 91  kg 91.2 kg    Examination: General: Critically ill, intubated on mechanical life support HENT: Endotracheal tube in place, left IJ Lungs: Bilateral ventilated breath sounds Cardiovascular: Regular rate and rhythm, S1-S2 Abdomen: Soft, nontender, nondistended Extremities: No significant edema Neuro: Alert oriented following commands on mechanical ventilation no focal deficit GU: Foley in place  CXR:  Chest x-ray 04/06/2019: Swan-Ganz catheter, endotracheal tube in place. The patient's images have been independently reviewed by me.    Resolved Hospital Problem list     Assessment & Plan:  Stephen Jennings is a 63 year old gentleman with no known PMH who presented with acute respiratory distress via EMS, intubated in the ED. Code STEMI was called and patient was taken to the cath lab with complete occlusion of the LAD, subsequently developed cardiogenic shock. He is s/p PCI with DES of the LAD and Impella placement, requiring dual vasopressor support with norepinephrine and epinephrine. PCCM consulted for vent management and hemodynamic management.  Acute hypoxemic respiratory failure requiring intubation mechanical ventilation -Remains intubated on mechanical life support -Continuing SBT SAT, tolerating this morning -Continue fentanyl with PRN Versed -VAP precautions - Patient meets SBT SAT criteria this morning.  However would like to make sure cardiac function optimized prior to liberation from ventilator as this will potentially precipitate repeat, acute failure, repeat arrest event after putting more load on the heart.  Cardiogenic Shock 2/2 anterior STEMI (8/9) Cardiac arrest on 04/06/2019, VT, defibrillation x2, CPR x10 minutes 2 rounds epinephrine - Titrated off of norepinephrine yesterday Continue milrinone Appreciate cardiology input Once cardiac stable patient meets all criteria for liberation from ventilator.  Aspiration Pneumonia, sputum culture growing pseudomonas Complete  14-day course, cefepime pansensitive Pseudomonas  Anemia Hemoglobin transfusion goal greater than 8  Anterior STEMI (8/9) s/p PCI/DES -Continue aspirin, ticagralor, statin  Non-sustained VT/PVC -Continue amiodarone -Maintain electrolytes at goal  Renal -Follow urine output, at risk for AK  Thrombocytopenia -Presumed critical illness related, had Impella, numbers improving -Continue to observe  Hyperglycemia -Continue CBG plus SSI  Best practice:  Diet: NPO Pain/Anxiety/Delirium protocol (if indicated): Yes VAP protocol (if indicated): Yes DVT prophylaxis: On heparin GI prophylaxis: Pantoprazole Glucose control: SSI Mobility: Bedrest Code Status: Full code Family Communication: we will attempt to call again  Disposition: ICU  Labs    CBC: Recent Labs  Lab 04/05/19 0345 04/05/19 1540 04/06/19 0347 04/06/19 0512 04/06/19 1033 04/06/19 1317 04/07/19 0026  WBC 9.5 8.5 8.0  --   --  9.3 8.7  HGB 9.0* 8.0* 7.2* 7.8* 7.5* 8.6* 8.7*  HCT 27.7* 24.3* 22.6* 23.0* 22.0* 26.5* 27.1*  MCV 94.2 92.7 94.2  --   --  94.6 94.4  PLT 138* 138* 166  --   --  175 824    Basic Metabolic Panel: Recent Labs  Lab 04/01/19 0317  04/01/19 2109 04/02/19 0423  04/03/19 0455  04/04/19 0406 04/04/19 0740 04/05/19 0345 04/06/19 0347 04/06/19 0512 04/06/19 1033 04/07/19 0515  NA 140   < > 136 136   < > 135   < > 135  --  135 139 141 141 142  K 3.6   < > 3.5 4.3   < >  4.3   < > 4.5  --  4.1 3.7 3.8 4.1 4.1  CL 111  --  107 107  --  104  --  103  --  104 108  --   --  107  CO2 21*  --  22 21*  --  23  --  24  --  22 23  --   --  26  GLUCOSE 159*  --  149* 161*  --  156*  --  208*  --  149* 113*  --   --  127*  BUN 18  --  19 19  --  21  --  23  --  28* 18  --   --  23  CREATININE 1.11  --  0.92 0.92  --  0.81  --  0.83  --  0.85 0.78  --   --  0.79  CALCIUM 7.5*  --  7.7* 8.1*  --  8.1*  --  7.8*  --  8.6* 8.2*  --   --  8.6*  MG 2.0  --  1.7 2.0  --  1.6*  --   --  1.9  --   1.8  --   --   --   PHOS 2.2*  --   --  2.2*  --  3.4  --   --   --   --   --   --   --   --    < > = values in this interval not displayed.   GFR: Estimated Creatinine Clearance: 106.8 mL/min (by C-G formula based on SCr of 0.79 mg/dL). Recent Labs  Lab 03/31/19 1205 03/31/19 1552  04/01/19 1312  04/04/19 2320 04/05/19 0345 04/05/19 1540 04/06/19 0347 04/06/19 1317 04/07/19 0026  PROCALCITON  --   --   --   --   --  0.14 0.12  --  <0.10  --   --   WBC  --  14.0*   < >  --    < >  --  9.5 8.5 8.0 9.3 8.7  LATICACIDVEN 3.5* 2.5*  --  1.4  --   --   --   --   --   --   --    < > = values in this interval not displayed.    Liver Function Tests: Recent Labs  Lab 04/01/19 0317 04/02/19 0423  AST 79* 47*  ALT 21 18  ALKPHOS 60 52  BILITOT 1.3* 0.9  PROT 4.3* 4.9*  ALBUMIN 2.4* 2.3*   No results for input(s): LIPASE, AMYLASE in the last 168 hours. No results for input(s): AMMONIA in the last 168 hours.  ABG    Component Value Date/Time   PHART 7.443 04/06/2019 1033   PCO2ART 34.7 04/06/2019 1033   PO2ART 203.0 (H) 04/06/2019 1033   HCO3 23.7 04/06/2019 1033   TCO2 25 04/06/2019 1033   ACIDBASEDEF 2.0 04/06/2019 0512   O2SAT 61.8 04/07/2019 0530     Coagulation Profile: No results for input(s): INR, PROTIME in the last 168 hours.  Cardiac Enzymes: No results for input(s): CKTOTAL, CKMB, CKMBINDEX, TROPONINI in the last 168 hours.  HbA1C: Hgb A1c MFr Bld  Date/Time Value Ref Range Status  03/31/2019 05:03 AM 6.0 (H) 4.8 - 5.6 % Final    Comment:    (NOTE) Pre diabetes:          5.7%-6.4% Diabetes:              >  6.4% Glycemic control for   <7.0% adults with diabetes     CBG: Recent Labs  Lab 04/06/19 1137 04/06/19 1701 04/06/19 1950 04/06/19 2335 04/07/19 0330  GLUCAP 110* 128* 102* 102* 110*    This patient is critically ill with multiple organ system failure; which, requires frequent high complexity decision making, assessment, support,  evaluation, and titration of therapies. This was completed through the application of advanced monitoring technologies and extensive interpretation of multiple databases. During this encounter critical care time was devoted to patient care services described in this note for 34 minutes.  [facility] 04/07/2019 9:14 AM

## 2019-04-07 NOTE — Progress Notes (Signed)
Pt restless and agitated, paged E-Link MD to notify and ask for sedation. Per E-Link MD, RN to page Cardiology MD on call to ask for any new orders.   RN paged Cards MD on call, waiting to receive call/new orders.   Pt stable and calm at the moment.   Will continue to monitor.   Stephen Jennings E [name], [place]

## 2019-04-07 NOTE — Progress Notes (Signed)
eLink Physician-Brief Progress Note Patient Name: Stephen Jennings DOB: 04/07/1956 MRN: [id number]   Date of Service  04/07/2019  HPI/Events of Note  Agitation - Patient extubated today and RN notes he gets very confused. No focal signs, easy to re-orient but showing signs of delirium. No hemodynamic instability, remains on amio/milrinone IV with HR in 78-80 range  eICU Interventions  RN is going to check with cardiology about use of precedex. High risk cardiac case given his multiple arrhythmias. At present, he is calm, on the phone.     Intervention Category Major Interventions: Arrhythmia - evaluation and management;Delirium, psychosis, severe agitation - evaluation and management  [name] 04/07/2019, 8:00 PM

## 2019-04-08 LAB — BASIC METABOLIC PANEL
Anion gap: 11 (ref 5–15)
BUN: 24 mg/dL — ABNORMAL HIGH (ref 8–23)
CO2: 24 mmol/L (ref 22–32)
Calcium: 8.3 mg/dL — ABNORMAL LOW (ref 8.9–10.3)
Chloride: 104 mmol/L (ref 98–111)
Creatinine, Ser: 0.84 mg/dL (ref 0.61–1.24)
GFR calc Af Amer: >60 mL/min (ref >60)
GFR calc non Af Amer: >60 mL/min (ref >60)
Glucose, Bld: 127 mg/dL — ABNORMAL HIGH (ref 70–99)
Potassium: 3.2 mmol/L — ABNORMAL LOW (ref 3.5–5.1)
Sodium: 139 mmol/L (ref 135–145)

## 2019-04-08 LAB — GLUCOSE, CAPILLARY
Glucose-Capillary: 108 mg/dL — ABNORMAL HIGH (ref 70–99)
Glucose-Capillary: 109 mg/dL — ABNORMAL HIGH (ref 70–99)
Glucose-Capillary: 112 mg/dL — ABNORMAL HIGH (ref 70–99)
Glucose-Capillary: 122 mg/dL — ABNORMAL HIGH (ref 70–99)
Glucose-Capillary: 144 mg/dL — ABNORMAL HIGH (ref 70–99)

## 2019-04-08 LAB — CBC
HCT: 27.4 % — ABNORMAL LOW (ref 39.0–52.0)
HCT: 28.9 % — ABNORMAL LOW (ref 39.0–52.0)
Hemoglobin: 8.8 g/dL — ABNORMAL LOW (ref 13.0–17.0)
Hemoglobin: 9.2 g/dL — ABNORMAL LOW (ref 13.0–17.0)
MCH: 30.1 pg (ref 26.0–34.0)
MCH: 30.6 pg (ref 26.0–34.0)
MCHC: 32.1 g/dL (ref 30.0–36.0)
MCHC: 31.8 g/dL (ref 30.0–36.0)
MCV: 93.8 fL (ref 80.0–100.0)
MCV: 96 fL (ref 80.0–100.0)
Platelets: 305 10*3/uL (ref 150–400)
Platelets: 352 10*3/uL (ref 150–400)
RBC: 2.92 MIL/uL — ABNORMAL LOW (ref 4.22–5.81)
RBC: 3.01 MIL/uL — ABNORMAL LOW (ref 4.22–5.81)
RDW: 14.8 % (ref 11.5–15.5)
RDW: 14.8 % (ref 11.5–15.5)
WBC: 9.8 10*3/uL (ref 4.0–10.5)
WBC: 9.2 10*3/uL (ref 4.0–10.5)
nRBC: 0 % (ref 0.0–0.2)
nRBC: 0 % (ref 0.0–0.2)

## 2019-04-08 LAB — COOXEMETRY PANEL
Carboxyhemoglobin: 1.5 % (ref 0.5–1.5)
Carboxyhemoglobin: 2 % — ABNORMAL HIGH (ref 0.5–1.5)
Carboxyhemoglobin: 1.8 % — ABNORMAL HIGH (ref 0.5–1.5)
Methemoglobin: 0.8 % (ref 0.0–1.5)
Methemoglobin: 0.8 % (ref 0.0–1.5)
Methemoglobin: 0.6 % (ref 0.0–1.5)
O2 Saturation: 51.9 %
O2 Saturation: 63.6 %
O2 Saturation: 91.7 %
Total hemoglobin: 9 g/dL — ABNORMAL LOW (ref 12.0–16.0)
Total hemoglobin: 17.7 g/dL — ABNORMAL HIGH (ref 12.0–16.0)
Total hemoglobin: 9 g/dL — ABNORMAL LOW (ref 12.0–16.0)

## 2019-04-08 LAB — MAGNESIUM: Magnesium: 1.8 mg/dL (ref 1.7–2.4)

## 2019-04-08 LAB — LACTATE DEHYDROGENASE: LDH: 333 U/L — ABNORMAL HIGH (ref 98–192)

## 2019-04-08 MED ORDER — POTASSIUM CHLORIDE 10 MEQ/50ML IV SOLN
10.0000 meq | INTRAVENOUS | Status: AC
  Administered 2019-04-08 (×4): 10 meq via INTRAVENOUS
  Filled 2019-04-08 (×4): qty 50

## 2019-04-08 MED ORDER — POTASSIUM CHLORIDE 20 MEQ/15ML (10%) PO SOLN
30.0000 meq | ORAL | Status: AC
  Administered 2019-04-08: 06:00:00 30 meq
  Filled 2019-04-08 (×2): qty 30

## 2019-04-08 MED ORDER — POTASSIUM CHLORIDE 20 MEQ PO PACK
40.0000 meq | PACK | Freq: Once | ORAL | Status: DC
  Filled 2019-04-08: qty 2

## 2019-04-08 MED ORDER — MAGNESIUM SULFATE 2 GM/50ML IV SOLN
2.0000 g | Freq: Once | INTRAVENOUS | Status: AC
  Administered 2019-04-08: 09:00:00 2 g via INTRAVENOUS
  Filled 2019-04-08: qty 50

## 2019-04-08 MED ORDER — ALTEPLASE 2 MG IJ SOLR
2.0000 mg | Freq: Once | INTRAMUSCULAR | Status: AC
  Administered 2019-04-08: 2 mg
  Filled 2019-04-08: qty 2

## 2019-04-08 MED ORDER — IPRATROPIUM-ALBUTEROL 0.5-2.5 (3) MG/3ML IN SOLN
3.0000 mL | Freq: Three times a day (TID) | RESPIRATORY_TRACT | Status: DC
  Administered 2019-04-08 – 2019-04-11 (×9): 3 mL via RESPIRATORY_TRACT
  Filled 2019-04-08 (×11): qty 3

## 2019-04-08 MED ORDER — PANTOPRAZOLE SODIUM 40 MG IV SOLR
40.0000 mg | Freq: Every day | INTRAVENOUS | Status: DC
  Administered 2019-04-08 – 2019-04-12 (×5): 40 mg via INTRAVENOUS
  Filled 2019-04-08 (×5): qty 40

## 2019-04-08 MED ORDER — SORBITOL 70 % PO SOLN
30.0000 mL | Freq: Once | ORAL | Status: DC
  Filled 2019-04-08: qty 30

## 2019-04-08 NOTE — Plan of Care (Signed)
  Problem: Nutrition: Goal: Adequate nutrition will be maintained Outcome: Not Progressing Note: Still working with speech to be able to swallow    Problem: Elimination: Goal: Will not experience complications related to urinary retention Outcome: Not Progressing Note: Having some urinary retention, required in and out cath today    Problem: Activity: Goal: Ability to tolerate increased activity will improve Outcome: Not Progressing

## 2019-04-08 NOTE — Progress Notes (Signed)
[facility] ADULT ICU REPLACEMENT PROTOCOL FOR AM LAB REPLACEMENT ONLY  The patient does apply for the [facility] Adult ICU Electrolyte Replacment Protocol based on the criteria listed below:   1. Is GFR >/= 40 ml/min? Yes.    Patient's GFR today is >60 2. Is urine output >/= 0.5 ml/kg/hr for the last 6 hours? Yes.   Patient's UOP is 0.5 ml/kg/hr 3. Is BUN < 60 mg/dL? Yes.    Patient's BUN today is 24  4. Abnormal electrolyte(s): k (3.2) 5. Ordered repletion with: protocol 6. If a panic level lab has been reported, has the CCM MD in charge been notified? No..   Physician:    Ronda Fairly A 04/08/2019 5:30 AM

## 2019-04-08 NOTE — Progress Notes (Signed)
IV team consulted for patient's central line. RN unable to get co ox sample this AM because ports don't pull back. IV team placed alteplase in catheter and it will sit for 2 hours prior to removal.   RN will re-try to collect blood sample prior to shift change.   Kenroy Timberman E [name], [place]

## 2019-04-08 NOTE — Progress Notes (Addendum)
Advanced Heart Failure Rounding Note  PCP-Cardiologist: No primary care provider on file.   Subjective:    Extubated 8/14. Had torsades 8/15 requiring shock and re-intubation.   Extubated 8/16.   Remains on milrinone 0.125 mcg + amio 30 mg per hour.  CO-OX 52%.   Confused and tachypneic. Oriented to self.     Objective:   Weight Range: 86.8 kg Body mass index is 28.25 kg/m.   Vital Signs:   Temp:  [97 F (36.1 C)-98.6 F (37 C)] 97 F (36.1 C) (08/17 0405) Pulse Rate:  [68-84] 82 (08/17 0600) Resp:  [12-47] 47 (08/17 0600) BP: (88-187)/(48-97) 122/79 (08/17 0600) SpO2:  [95 %-100 %] 99 % (08/17 0600) Arterial Line BP: (125-189)/(36-81) 179/69 (08/17 0600) FiO2 (%):  [40 %] 40 % (08/16 0800) Weight:  [86.8 kg] 86.8 kg (08/17 0405) Last BM Date: (PTA)  Weight change: Filed Weights   04/06/19 0600 04/07/19 0500 04/08/19 0405  Weight: 91 kg 91.2 kg 86.8 kg    Intake/Output:   Intake/Output Summary (Last 24 hours) at 04/08/2019 0745 Last data filed at 04/08/2019 0600 Gross per 24 hour  Intake 1152.85 ml  Output 2755 ml  Net -1602.15 ml      Physical Exam   General: Using accessory muscles.  HEENT: normal Neck: supple. JVD 7-8 . Carotids 2+ bilat; no bruits. No lymphadenopathy or thryomegaly appreciated. Cor: PMI nondisplaced. Regular rate & rhythm. No rubs, gallops or murmurs. Lungs: Shallow/rapid/Rhonchi throughout on 3 liters  Abdomen: soft, nontender, nondistended. No hepatosplenomegaly. No bruits or masses. Good bowel sounds. Extremities: no cyanosis, clubbing, rash, edema Neuro: alert & orientedx1 self.  cranial nerves grossly intact. moves all 4 extremities w/o difficulty. Affect pleasant   Telemetry  SR 70-80s personally reviewed.   EKG    N/a   Labs    CBC Recent Labs    04/07/19 1217 04/08/19 0400  WBC 9.3 9.8  HGB 9.5* 8.8*  HCT 29.5* 27.4*  MCV 95.2 93.8  PLT 268 921   Basic Metabolic Panel Recent Labs    04/06/19 0347   04/07/19 0515 04/08/19 0400  NA 139   < > 142 139  K 3.7   < > 4.1 3.2*  CL 108  --  107 104  CO2 23  --  26 24  GLUCOSE 113*  --  127* 127*  BUN 18  --  23 24*  CREATININE 0.78  --  0.79 0.84  CALCIUM 8.2*  --  8.6* 8.3*  MG 1.8  --   --  1.8   < > = values in this interval not displayed.   Liver Function Tests No results for input(s): AST, ALT, ALKPHOS, BILITOT, PROT, ALBUMIN in the last 72 hours. No results for input(s): LIPASE, AMYLASE in the last 72 hours. Cardiac Enzymes No results for input(s): CKTOTAL, CKMB, CKMBINDEX, TROPONINI in the last 72 hours.  BNP: BNP (last 3 results) Recent Labs    03/31/19 0013  BNP 1,247.4*    ProBNP (last 3 results) No results for input(s): PROBNP in the last 8760 hours.   D-Dimer No results for input(s): DDIMER in the last 72 hours. Hemoglobin A1C No results for input(s): HGBA1C in the last 72 hours. Fasting Lipid Panel Recent Labs    04/06/19 0347  TRIG 111   Thyroid Function Tests No results for input(s): TSH, T4TOTAL, T3FREE, THYROIDAB in the last 72 hours.  Invalid input(s): FREET3  Other results:   Imaging    Dg  Abd Portable 1v  Result Date: 04/07/2019 CLINICAL DATA:  Abdominal distension EXAM: PORTABLE ABDOMEN - 1 VIEW COMPARISON:  Plain film of the abdomen dated 04/06/2019. FINDINGS: Enteric tube in the stomach with tip directed towards the stomach pylorus/duodenal bulb. Visualized bowel gas pattern is nonobstructive. IMPRESSION: Enteric tube in the stomach with tip directed towards the stomach pylorus/duodenal bulb. Electronically Signed   By: Franki Cabot M.D.   On: 04/07/2019 13:27     Medications:     Scheduled Medications: . sodium chloride   Intravenous Once  . aspirin  81 mg Oral Daily  . atorvastatin  80 mg Oral q1800  . chlorhexidine  15 mL Mouth Rinse BID  . Chlorhexidine Gluconate Cloth  6 each Topical Daily  . digoxin  0.125 mg Oral Daily  . heparin injection (subcutaneous)  5,000 Units  Subcutaneous Q8H  . insulin aspart  0-9 Units Subcutaneous Q4H  . insulin aspart  3 Units Subcutaneous Q4H  . insulin glargine  15 Units Subcutaneous Daily  . ipratropium-albuterol  3 mL Nebulization TID  . mouth rinse  15 mL Mouth Rinse q12n4p  . pantoprazole sodium  40 mg Oral Daily  . potassium chloride  30 mEq Per Tube Q4H  . sodium chloride flush  10-40 mL Intracatheter Q12H  . spironolactone  12.5 mg Oral Daily  . Thrombi-Pad  1 each Topical Once  . ticagrelor  90 mg Oral BID    Infusions: . sodium chloride Stopped (04/06/19 0930)  . amiodarone 30 mg/hr (04/08/19 0600)  . ceFEPime (MAXIPIME) IV 2 g (04/08/19 2683)  . feeding supplement (VITAL 1.5 CAL) Stopped (04/04/19 1600)  . milrinone 0.125 mcg/kg/min (04/08/19 0600)  . norepinephrine (LEVOPHED) Adult infusion Stopped (04/06/19 0933)    PRN Medications: acetaminophen, ipratropium-albuterol, nitroGLYCERIN, ondansetron (ZOFRAN) IV, sodium chloride flush   *  Assessment/Plan    1. Cardiac arrest/VT/torsades on 8/15 - rhythm stable on IV amio - Keep K> 4.0 Mg > 2.0 - if recovers will need Lifevest vs ICD prior to ICD  2. Acute systolic HF -> Cardiogenic shock due to acute anterior MI - s/p PCI/DES to LAD on 8/9 - Impella removed 8/12 - Off NE - Continue  Milrinone 0.125. Co-ox 52%.  - Continue digoxin 0.125 + 12.5 mg spiro daily.  - Echo 20-25%  3. CAD with acute anterior MI 8/9 - s/p PCI/DES to LAD. 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 Acute hypoxic respiratory failure - in setting of #1 - extubated 8/14. Reintubated 8/15 in setting of torsades. Extubated 8/16.  - Respiratory status again tenuous today   4. DM2 - new diagnosis - cover with SSI  5. COPD - ongoing tobacco use - needs cessation   6. NSVT/Torsades - Improved on amio drip. Continue amio drip 30 mg per hour.  - Keep K> 4.0 Mg > 2.0 - Supp  K . Give 2 grams Mag.   7. Anemia, acute blood loss - Got 1u RBC on 8/15. Hgb 8.8   8. Possible ileus.  - KUB 8/16 no ileus   Swallow evaluation today.   Length of Stay: 8  Amy Clegg, NP  04/08/2019, 7:45 AM  Advanced Heart Failure Team Pager [phone] (M-F; [place])  Please contact [facility] for night-coverage after hours (4p -7a ) and weekends on [email or web address]  Remains very tenuous after large MI. On milrinone 0.125 co-ox low at 52%. Respiratory status  worse. Remains confused. Weight down.   On exam  Tachypneic confused Cor RRR Lungs coarse Ab obese NT/NT hypoactive BD Ext no edema.   Both respiratory and cardiac systems tenuous. Co-ox low on milrinone. Will repeat co-ox and titrate as needed. Volume status doesn't look too bad but will give another dose of iv lasix to see if we can help with respiratory status. CVX ok. Swallow study today. At high risk for aspiration.   CRITICAL CARE Performed by: [name]  Total critical care time: 35 minutes  Critical care time was exclusive of separately billable procedures and treating other patients.  Critical care was necessary to treat or prevent imminent or life-threatening deterioration.  Critical care was time spent personally by me (independent of midlevel providers or residents) on the following activities: development of treatment plan with patient and/or surrogate as well as nursing, discussions with consultants, evaluation of patient's response to treatment, examination of patient, obtaining history from patient or surrogate, ordering and performing treatments and interventions, ordering and review of laboratory studies, ordering and review of radiographic studies, pulse oximetry and re-evaluation of patient's condition.   [name], MD  8:30 AM

## 2019-04-08 NOTE — Evaluation (Signed)
Clinical/Bedside Swallow Evaluation Patient Details  Name: Stephen Jennings MRN: [id number] Date of Birth: 1956-05-22  Today's Date: 04/08/2019 Time: SLP Start Time (ACUTE ONLY): 1050 SLP Stop Time (ACUTE ONLY): 1112 SLP Time Calculation (min) (ACUTE ONLY): 22 min  Past Medical History:  Past Medical History:  Diagnosis Date  . Medical history non-contributory    Past Surgical History:  Past Surgical History:  Procedure Laterality Date  . CORONARY/GRAFT ACUTE MI REVASCULARIZATION N/A 03/31/2019   Procedure: Coronary/Graft Acute MI Revascularization;  Surgeon: Troy Sine, MD;  Location: [place] [facility];  Service: Cardiovascular;  Laterality: N/A;  . LEFT HEART CATH AND CORONARY ANGIOGRAPHY N/A 03/31/2019   Procedure: LEFT HEART CATH AND CORONARY ANGIOGRAPHY;  Surgeon: Troy Sine, MD;  Location: [place] [facility];  Service: Cardiovascular;  Laterality: N/A;  . NO PAST SURGERIES    . RIGHT HEART CATH N/A 03/31/2019   Procedure: RIGHT HEART CATH;  Surgeon: Troy Sine, MD;  Location: [place] [facility];  Service: Cardiovascular;  Laterality: N/A;  . VENTRICULAR ASSIST DEVICE INSERTION N/A 03/31/2019   Procedure: VENTRICULAR ASSIST DEVICE INSERTION;  Surgeon: Troy Sine, MD;  Location: [facility] [facility];  Service: Cardiovascular;  Laterality: N/A;   HPI:  24M with no known PMHx who presented with respiratory distress via EMS, intubated in the ED 8/9.  Code STEMI was called and patient was taken to the cath lab with complete occlusion of the LAD with cardiogenic shock. Pt was extubated 8/14 but was reintubated on 8/15 when he suffered a cardiac arrest requiring defibrillation x2, 2 rounds of epi approximately 10 minutes CPR with ROSC. He was extubated 8/16. Diet had been initiated, but RN noticed coughing with PO intake.   Assessment / Plan / Recommendation Clinical Impression  Pt's voice is relatively clear considering his repeated intubations, but his volitional cough is  significantly weak and at times, not even elicited. Consistent coughing is triggered after trials of thin liquids, before he nears the 3 ounce mark. His RR at baseline is in the 30s, increasing into the mid-40s during intake across consistencies. Subjectively, he reports feeling like swallowing is "hard" and takes all of his energy. Recommend that he be NPO except for small snacks of puree from the floor stock, which could also be used to administer meds. RN could also administer a few ice chips after oral care if RR is near 30 or below. SLP will f/u for readiness to resume diet with improvements in endurance and respirations.  SLP Visit Diagnosis: Dysphagia, unspecified (R13.10)    Aspiration Risk  Moderate aspiration risk    Diet Recommendation NPO except meds;Ice chips PRN after oral care   Medication Administration: Whole meds with puree(crush any larger pills)    Other  Recommendations Oral Care Recommendations: Oral care QID;Oral care prior to ice chip/H20 Other Recommendations: Have oral suction available   Follow up Recommendations (tba)      Frequency and Duration min 2x/week  2 weeks       Prognosis Prognosis for Safe Diet Advancement: Good      Swallow Study   General HPI: 24M with no known PMHx who presented with respiratory distress via EMS, intubated in the ED 8/9.  Code STEMI was called and patient was taken to the cath lab with complete occlusion of the LAD with cardiogenic shock. Pt was extubated 8/14 but was reintubated on 8/15 when he suffered a cardiac arrest requiring defibrillation x2, 2 rounds of epi approximately 10  minutes CPR with ROSC. He was extubated 8/16. Diet had been initiated, but RN noticed coughing with PO intake. Type of Study: Bedside Swallow Evaluation Previous Swallow Assessment: none in chart Diet Prior to this Study: Dysphagia 3 (soft);Thin liquids Temperature Spikes Noted: No Respiratory Status: Nasal cannula History of Recent Intubation:  Yes Length of Intubations (days): 8 days(across 2 intubations) Date extubated: 04/07/19 Behavior/Cognition: Alert;Cooperative Oral Cavity Assessment: Within Functional Limits Oral Care Completed by SLP: No Oral Cavity - Dentition: Missing dentition Vision: Functional for self-feeding Self-Feeding Abilities: Able to feed self Patient Positioning: Upright in bed Baseline Vocal Quality: (minimally rough, soft) Volitional Cough: Weak(at times not elicited) Volitional Swallow: Able to elicit    Oral/Motor/Sensory Function Overall Oral Motor/Sensory Function: Within functional limits   Ice Chips Ice chips: Impaired Presentation: Spoon Pharyngeal Phase Impairments: Cough - Immediate(x1)   Thin Liquid Thin Liquid: Impaired Presentation: Cup;Self Fed Pharyngeal  Phase Impairments: Cough - Immediate;Cough - Delayed;Change in Vital Signs    Nectar Thick Nectar Thick Liquid: Not tested   Honey Thick Honey Thick Liquid: Not tested   Puree Puree: Impaired Presentation: Spoon;Self Fed Pharyngeal Phase Impairments: Change in Vital Signs;Multiple swallows   Solid     Solid: Not tested      Stephen Jennings 04/08/2019,12:46 PM   Stephen Jennings, M.A. [place] Acute Environmental education officer [phone] Office [phone]

## 2019-04-08 NOTE — Progress Notes (Addendum)
NAME:  Stephen Jennings, MRN:  [id number], DOB:  1956/02/19, LOS: 8 ADMISSION DATE:  03/31/2019, CONSULTATION DATE:  03/31/2019 REFERRING MD:  Dr. Claiborne Billings, Cardiology, CHIEF COMPLAINT:  Acute respiratory failure, cardiogenic shock  Brief History   63M with no known PMHx who presented with respiratory distress via EMS, intubated in the ED.  Code STEMI was called and patient was taken to the cath lab with complete occlusion of the LAD with cardiogenic shock.  PCCM consulted for vent management and hemodynamic management.  History of present illness   Stephen Jennings is a 63 year old gentleman with no known PMHx who presented with respiratory distress via EMS. Patient's wife described that patient had episodes of chest pain/pressure and diaphoresis in the weeks leading up to presentation. On the day of admission, he was getting ready for bed and then became acutely dyspneic, at which point EMS was called. He was intubated in the ED. A code STEMI was called and patient was taken to the cath lab with complete occlusion of the LAD and subsequently developed cardiogenic shock. Impella device was placed and he was started on dual pressor support with norepinephrine and epinephrine. PCCM consulted for vent management and hemodynamic management.  Past Medical History  No known past medical history  Significant Hospital Events   03/31/19: Intubation; Anterior STEMI with 100% LAD occlusion, s/p PCI/DES, Impella  03/31/19: Code, CPR and 1 amp epi given with immediate ROSC   04/03/19: Impella removed  04/06/2019: Cardiac arrest, VT, defibrillation x2, 2 rounds of epi approximately 10 minutes CPR with ROSC  Consults:  Cardiology Heart Failure Team PCCM  Procedures:  03/31/19: ETT placed  03/31/19: PCI to LAD with DES placement.   Impella 03/31/19 >> 04/03/19  03/31/19: Left IJ TLC  03/31/19: Swan-Ganz catheter  04/06/2019: Intubation, CPR, defibrillation x2 for VT  04/07/2019: extubated, weaned off NE   Significant  Diagnostic Tests:  03/31/19 Left heart cath: Revealed 100% occlusion of LAD, 70% stenosis of RCA.  03/31/19 Echocardiogram: The left ventricle has severely reduced systolic function, with an ejection fraction of 20-25%. The cavity size was moderately dilated. Left ventricular diastolic function could not be evaluated. Septal apical and anterior wall hyppkinesis Impella device seen in LV extending to mid ventrical CW through AV suggests low output. The right ventricle has moderately reduced systolic function. The cavity was mildly enlarged. There is no increase in right ventricular wall thickness. Left atrial size was moderately dilated. Right atrial size was mildly dilated. The aortic valve was not well visualized. Impella device seen coursing across AV. The aorta is normal in size and structure. The interatrial septum was not well visualized   CXR:  Chest x-ray 04/06/2019: Swan-Ganz catheter, endotracheal tube in place.     Micro Data:  MRSA PCR 8/9 >> negative SARS CoV 2 8/9 >> negative Blood Cx 8/9 >> no growth to date Urine Cx 8/9 >> no growth Sputum Cx 8/13 >> moderate pseudomonas (susceptibilities pending)  Antimicrobials:  Vancomycin (8/9) Cefepime (8/9-8/14) 8/15>>> Meropenem (8/14-8/15 )  Interim history/subjective:   Extubated and off of NE Remains on milrinone, digoxin per cardiology   Objective   Blood pressure (!) 132/50, pulse 73, temperature (!) 97 F (36.1 C), temperature source Oral, resp. rate (!) 31, height 5' 9.02" (1.753 m), weight 86.8 kg, SpO2 100 %.        Intake/Output Summary (Last 24 hours) at 04/08/2019 1045 Last data filed at 04/08/2019 1000 Gross per 24 hour  Intake 1233.53 ml  Output 2455 ml  Net -1221.47 ml   Filed Weights   04/06/19 0600 04/07/19 0500 04/08/19 0405  Weight: 91 kg 91.2 kg 86.8 kg    Examination: General: Chronically ill, older adult M, reclined in bed NAD HENT: NCAT. Patent nares, [place] on face but not positioned within  nares. Trachea midline. Poor dentition, pink mmm.  Lungs: Course crackles throughout. Symmetrical chest expansion. No accessory muscle recruitment  Cardiovascular: RRR s1s2 no rgm. Cap refill < 3 seconds  Abdomen: Protuberant. Soft, round, ndnt. Normoactive  Extremities: Symmetrical bulk and tone, no cyanosis, no clubbing, no BLE edema  Neuro: AAOx 3, some intermittent confusion. Following commands Skin: pale, damp, warm, without rash Psych: euthymic mood congruent affect    Resolved Hospital Problem list     Assessment & Plan:  Stephen Jennings is a 63 year old gentleman with no known PMH who presented with acute respiratory distress via EMS, intubated in the ED. Code STEMI was called and patient was taken to the cath lab with complete occlusion of the LAD, subsequently developed cardiogenic shock. He is s/p PCI with DES of the LAD and Impella placement, requiring dual vasopressor support with norepinephrine and epinephrine. PCCM consulted for vent management and hemodynamic management.  Acute hypoxemic respiratory failure requiring intubation mechanical ventilation -suspect pulmonary edema 8/17 on exam P -Extubated 8/16 -Supplemental O2 for SpO2 goal > 94% -IS, flutter -AM CXR -8/17 Heart failure is diuresing, anticipate this will help possible pulm edema  Cardiogenic Shock 2/2 anterior STEMI (8/9) Cardiac arrest on 04/06/2019, VT, defibrillation x2, CPR x10 minutes 2 rounds epinephrine P Cardiology primary, heart failure following  Continue milrinone, digoxin  Aspiration Pneumonia, sputum culture growing pseudomonas P Continue cefepime for 14 day course   Anemia Hemoglobin transfusion goal greater than 8 Trend CBC  Anterior STEMI (8/9) s/p PCI/DES Continue aspirin, ticagralor, statin  Non-sustained VT/PVC P Continue tele Continue amiodarone Maintain K > 4 Mag > 2  Renal P MAP goal > 65 for renal perfusion Trend UOP   Thrombocytopenia -Presumed critical illness  related, had Impella, numbers improving P Continue to observe, follow CBC  Hyperglycemia SSI  Dysphagia, at risk P SLP eval    Best practice:  Diet: NPO, pending SLP eval  Pain/Anxiety/Delirium protocol (if indicated): no VAP protocol (if indicated): no DVT prophylaxis: heparin GI prophylaxis: Pantoprazole Glucose control: SSI Mobility: BR  Code Status: Full code Family Communication: per primary  Disposition: ICU  Labs    CBC: Recent Labs  Lab 04/06/19 0347  04/06/19 1033 04/06/19 1317 04/07/19 0026 04/07/19 1217 04/08/19 0400  WBC 8.0  --   --  9.3 8.7 9.3 9.8  HGB 7.2*   < > 7.5* 8.6* 8.7* 9.5* 8.8*  HCT 22.6*   < > 22.0* 26.5* 27.1* 29.5* 27.4*  MCV 94.2  --   --  94.6 94.4 95.2 93.8  PLT 166  --   --  175 214 268 305   < > = values in this interval not displayed.    Basic Metabolic Panel: Recent Labs  Lab 04/02/19 0423  04/03/19 0455  04/04/19 0406 04/04/19 0740 04/05/19 0345 04/06/19 0347 04/06/19 0512 04/06/19 1033 04/07/19 0515 04/08/19 0400  NA 136   < > 135   < > 135  --  135 139 141 141 142 139  K 4.3   < > 4.3   < > 4.5  --  4.1 3.7 3.8 4.1 4.1 3.2*  CL 107  --  104  --  103  --  104  108  --   --  107 104  CO2 21*  --  23  --  24  --  22 23  --   --  26 24  GLUCOSE 161*  --  156*  --  208*  --  149* 113*  --   --  127* 127*  BUN 19  --  21  --  23  --  28* 18  --   --  23 24*  CREATININE 0.92  --  0.81  --  0.83  --  0.85 0.78  --   --  0.79 0.84  CALCIUM 8.1*  --  8.1*  --  7.8*  --  8.6* 8.2*  --   --  8.6* 8.3*  MG 2.0  --  1.6*  --   --  1.9  --  1.8  --   --   --  1.8  PHOS 2.2*  --  3.4  --   --   --   --   --   --   --   --   --    < > = values in this interval not displayed.   GFR: Estimated Creatinine Clearance: 99.4 mL/min (by C-G formula based on SCr of 0.84 mg/dL). Recent Labs  Lab 04/01/19 1312  04/04/19 2320 04/05/19 0345  04/06/19 0347 04/06/19 1317 04/07/19 0026 04/07/19 1217 04/08/19 0400  PROCALCITON  --    --  0.14 0.12  --  <0.10  --   --   --   --   WBC  --    < >  --  9.5   < > 8.0 9.3 8.7 9.3 9.8  LATICACIDVEN 1.4  --   --   --   --   --   --   --   --   --    < > = values in this interval not displayed.    Liver Function Tests: Recent Labs  Lab 04/02/19 0423  AST 47*  ALT 18  ALKPHOS 52  BILITOT 0.9  PROT 4.9*  ALBUMIN 2.3*   No results for input(s): LIPASE, AMYLASE in the last 168 hours. No results for input(s): AMMONIA in the last 168 hours.  ABG    Component Value Date/Time   PHART 7.443 04/06/2019 1033   PCO2ART 34.7 04/06/2019 1033   PO2ART 203.0 (H) 04/06/2019 1033   HCO3 23.7 04/06/2019 1033   TCO2 25 04/06/2019 1033   ACIDBASEDEF 2.0 04/06/2019 0512   O2SAT 63.6 04/08/2019 0905     Coagulation Profile: No results for input(s): INR, PROTIME in the last 168 hours.  Cardiac Enzymes: No results for input(s): CKTOTAL, CKMB, CKMBINDEX, TROPONINI in the last 168 hours.  HbA1C: Hgb A1c MFr Bld  Date/Time Value Ref Range Status  03/31/2019 05:03 AM 6.0 (H) 4.8 - 5.6 % Final    Comment:    (NOTE) Pre diabetes:          5.7%-6.4% Diabetes:              >6.4% Glycemic control for   <7.0% adults with diabetes     CBG: Recent Labs  Lab 04/07/19 1109 04/07/19 1459 04/07/19 1944 04/07/19 2344 04/08/19 0422  GLUCAP 141* 143* 124* 108* 122*    Critical Care Time 35 min  Eliseo Gum MSN, AGACNP-BC [place] [phone] If no answer, [phone] 04/08/2019, 10:45 AM

## 2019-04-09 ENCOUNTER — Inpatient Hospital Stay (HOSPITAL_COMMUNITY): Payer: Self-pay

## 2019-04-09 LAB — CBC
HCT: 27.1 % — ABNORMAL LOW (ref 39.0–52.0)
HCT: 30.8 % — ABNORMAL LOW (ref 39.0–52.0)
Hemoglobin: 8.6 g/dL — ABNORMAL LOW (ref 13.0–17.0)
Hemoglobin: 9.8 g/dL — ABNORMAL LOW (ref 13.0–17.0)
MCH: 29.8 pg (ref 26.0–34.0)
MCH: 30.3 pg (ref 26.0–34.0)
MCHC: 31.7 g/dL (ref 30.0–36.0)
MCHC: 31.8 g/dL (ref 30.0–36.0)
MCV: 93.8 fL (ref 80.0–100.0)
MCV: 95.4 fL (ref 80.0–100.0)
Platelets: 390 10*3/uL (ref 150–400)
Platelets: 488 10*3/uL — ABNORMAL HIGH (ref 150–400)
RBC: 2.89 MIL/uL — ABNORMAL LOW (ref 4.22–5.81)
RBC: 3.23 MIL/uL — ABNORMAL LOW (ref 4.22–5.81)
RDW: 14.7 % (ref 11.5–15.5)
RDW: 14.8 % (ref 11.5–15.5)
WBC: 8.2 10*3/uL (ref 4.0–10.5)
WBC: 8.8 10*3/uL (ref 4.0–10.5)
nRBC: 0 % (ref 0.0–0.2)
nRBC: 0 % (ref 0.0–0.2)

## 2019-04-09 LAB — MAGNESIUM: Magnesium: 2 mg/dL (ref 1.7–2.4)

## 2019-04-09 LAB — GLUCOSE, CAPILLARY
Glucose-Capillary: 111 mg/dL — ABNORMAL HIGH (ref 70–99)
Glucose-Capillary: 108 mg/dL — ABNORMAL HIGH (ref 70–99)
Glucose-Capillary: 87 mg/dL (ref 70–99)
Glucose-Capillary: 94 mg/dL (ref 70–99)
Glucose-Capillary: 95 mg/dL (ref 70–99)
Glucose-Capillary: 129 mg/dL — ABNORMAL HIGH (ref 70–99)
Glucose-Capillary: 104 mg/dL — ABNORMAL HIGH (ref 70–99)
Glucose-Capillary: 108 mg/dL — ABNORMAL HIGH (ref 70–99)

## 2019-04-09 LAB — CBC WITH DIFFERENTIAL/PLATELET
Abs Immature Granulocytes: 0.04 10*3/uL (ref 0.00–0.07)
Basophils Absolute: 0 10*3/uL (ref 0.0–0.1)
Basophils Relative: 0 %
Eosinophils Absolute: 0.3 10*3/uL (ref 0.0–0.5)
Eosinophils Relative: 3 %
HCT: 29.9 % — ABNORMAL LOW (ref 39.0–52.0)
Hemoglobin: 9.6 g/dL — ABNORMAL LOW (ref 13.0–17.0)
Immature Granulocytes: 1 %
Lymphocytes Relative: 10 %
Lymphs Abs: 0.9 10*3/uL (ref 0.7–4.0)
MCH: 30.4 pg (ref 26.0–34.0)
MCHC: 32.1 g/dL (ref 30.0–36.0)
MCV: 94.6 fL (ref 80.0–100.0)
Monocytes Absolute: 0.9 10*3/uL (ref 0.1–1.0)
Monocytes Relative: 10 %
Neutro Abs: 6.4 10*3/uL (ref 1.7–7.7)
Neutrophils Relative %: 76 %
Platelets: 446 10*3/uL — ABNORMAL HIGH (ref 150–400)
RBC: 3.16 MIL/uL — ABNORMAL LOW (ref 4.22–5.81)
RDW: 14.7 % (ref 11.5–15.5)
WBC: 8.5 10*3/uL (ref 4.0–10.5)
nRBC: 0 % (ref 0.0–0.2)

## 2019-04-09 LAB — COOXEMETRY PANEL
Carboxyhemoglobin: 1.9 % — ABNORMAL HIGH (ref 0.5–1.5)
Carboxyhemoglobin: 1.9 % — ABNORMAL HIGH (ref 0.5–1.5)
Methemoglobin: 0.5 % (ref 0.0–1.5)
Methemoglobin: 0.6 % (ref 0.0–1.5)
O2 Saturation: 83.8 %
O2 Saturation: 59.8 %
Total hemoglobin: 6.9 g/dL — CL (ref 12.0–16.0)
Total hemoglobin: 8.5 g/dL — ABNORMAL LOW (ref 12.0–16.0)

## 2019-04-09 LAB — BASIC METABOLIC PANEL
Anion gap: 9 (ref 5–15)
BUN: 24 mg/dL — ABNORMAL HIGH (ref 8–23)
CO2: 23 mmol/L (ref 22–32)
Calcium: 8.3 mg/dL — ABNORMAL LOW (ref 8.9–10.3)
Chloride: 109 mmol/L (ref 98–111)
Creatinine, Ser: 0.82 mg/dL (ref 0.61–1.24)
GFR calc Af Amer: >60 mL/min (ref >60)
GFR calc non Af Amer: >60 mL/min (ref >60)
Glucose, Bld: 98 mg/dL (ref 70–99)
Potassium: 3.6 mmol/L (ref 3.5–5.1)
Sodium: 141 mmol/L (ref 135–145)

## 2019-04-09 LAB — TRIGLYCERIDES: Triglycerides: 88 mg/dL (ref <150)

## 2019-04-09 LAB — LACTATE DEHYDROGENASE: LDH: 315 U/L — ABNORMAL HIGH (ref 98–192)

## 2019-04-09 MED ORDER — RESOURCE THICKENUP CLEAR PO POWD
ORAL | Status: DC | PRN
  Filled 2019-04-09: qty 125

## 2019-04-09 MED ORDER — POTASSIUM CHLORIDE 10 MEQ/50ML IV SOLN
10.0000 meq | INTRAVENOUS | Status: AC
  Administered 2019-04-09 (×4): 10 meq via INTRAVENOUS
  Filled 2019-04-09 (×4): qty 50

## 2019-04-09 MED ORDER — LOSARTAN POTASSIUM 25 MG PO TABS
12.5000 mg | ORAL_TABLET | Freq: Every day | ORAL | Status: DC
  Administered 2019-04-09: 12.5 mg via ORAL
  Filled 2019-04-09: qty 1

## 2019-04-09 MED ORDER — SPIRONOLACTONE 25 MG PO TABS
25.0000 mg | ORAL_TABLET | Freq: Every day | ORAL | Status: DC
  Administered 2019-04-09 – 2019-04-16 (×8): 25 mg via ORAL
  Filled 2019-04-09 (×8): qty 1

## 2019-04-09 MED ORDER — AMIODARONE HCL 200 MG PO TABS
200.0000 mg | ORAL_TABLET | Freq: Two times a day (BID) | ORAL | Status: DC
  Administered 2019-04-09 – 2019-04-13 (×9): 200 mg via ORAL
  Filled 2019-04-09 (×9): qty 1

## 2019-04-09 MED ORDER — ENSURE ENLIVE PO LIQD
237.0000 mL | Freq: Two times a day (BID) | ORAL | Status: DC
  Administered 2019-04-10 – 2019-04-16 (×8): 237 mL via ORAL

## 2019-04-09 NOTE — Progress Notes (Signed)
Modified Barium Swallow Progress Note  Patient Details  Name: Stephen Jennings MRN: [id number] Date of Birth: 09/13/1955  Today's Date: 04/09/2019  Modified Barium Swallow completed.  Full report located under Chart Review in the Imaging Section.  Brief recommendations include the following:  Clinical Impression  Pt has a mild oropharyngeal dysphagia characterized by lingual weakness that slows oral transit and reduced base of tongue retraction for epiglottic deflection and full vallecular clearance. Mild-moderate remained in the valleculae post-swallow, often cleared spontaneously by a second swallow. He is additionally impacted by his limited dentition, resulting prolonged and incomplete oral clearance of soft solids. Pt initially demonstrated adequate airway protection even with thin liquids, but as the study progressed his work and rate of breathing began to increase with fatigue, and he started to have deep laryngeal penetration with thin liquids. He was not observed to aspirate, but also could not clear penetrates despite cues for a volitional cough, and liquids were consistently entering the laryngeal vestibule on each trial. He continued to have penetration with large, consecutive boluses of nectar thick liquids via straw, although not quite reaching the vocal folds. When the straw was removed, SLP could better regulate his pacing and bolus size and he subsequently had better airway protection. Recommend starting with a full liquid diet thickened to nectar thick liquids to increase airway protection and conserve energy. Would provide full supervision to assist with small, single cup sips and to take frequent rest breaks to accomodate his current respiratory status. SLP will continue to follow for potential to advance pending improved overall endurance.   Swallow Evaluation Recommendations       SLP Diet Recommendations: Nectar thick liquid   Liquid Administration via: Cup;Spoon;No straw   Medication Administration: Whole meds with puree   Supervision: Staff to assist with self feeding;Full supervision/cueing for compensatory strategies   Compensations: Slow rate;Small sips/bites;Clear throat intermittently   Postural Changes: Seated upright at 90 degrees;Remain semi-upright after after feeds/meals (Comment)   Oral Care Recommendations: Oral care BID   Other Recommendations: Order thickener from pharmacy;Prohibited food (jello, ice cream, thin soups);Remove water pitcher    Venita Sheffield Foxx Klarich 04/09/2019,1:58 PM   [name], M.A. [place] Acute Environmental education officer [phone] Office [phone]

## 2019-04-09 NOTE — Evaluation (Signed)
Physical Therapy Evaluation Patient Details Name: Stephen Jennings MRN: [id number] DOB: July 20, 1956 Today's Date: 04/09/2019   History of Present Illness  16M with no known PMHx who presented with respiratory distress via EMS, intubated in the ED 8/9.  Code STEMI was called and patient was taken to the cath lab with complete occlusion of the LAD with cardiogenic shock. Pt was extubated 8/14 but was reintubated on 8/15 when he suffered a cardiac arrest requiring defibrillation x2, 2 rounds of epi approximately 10 minutes CPR with ROSC. He was extubated 8/16.  Clinical Impression  Patient presents with decreased independence with mobility due to generalized weakness, decreased balance, and decreased activity tolerance.  He will benefit from skilled PT in the acute setting to allow return home with wife support following acute stay with follow up HHPT.  Currently min a for OOB to chair.      Follow Up Recommendations Home health PT;Supervision/Assistance - 24 hour    Equipment Recommendations  Rolling walker with 5" wheels    Recommendations for Other Services       Precautions / Restrictions Precautions Precautions: Fall      Mobility  Bed Mobility Overal bed mobility: Needs Assistance Bed Mobility: Supine to Sit     Supine to sit: Min guard;Min assist     General bed mobility comments: for scooting to EOB  Transfers Overall transfer level: Needs assistance Equipment used: Rolling walker (2 wheeled) Transfers: Sit to/from [facility] Sit to Stand: Min assist Stand pivot transfers: Min assist       General transfer comment: to balance, for anterior weight shift; assist for balance and lines to step around to chair, some SOB noted  Ambulation/Gait                Stairs            Wheelchair Mobility    Modified Rankin (Stroke Patients Only)       Balance Overall balance assessment: Needs assistance Sitting-balance support: Feet  supported Sitting balance-Leahy Scale: Fair     Standing balance support: During functional activity;Bilateral upper extremity supported Standing balance-Leahy Scale: Poor Standing balance comment: UE support for static balance, min A for dynamic                             Pertinent Vitals/Pain Pain Assessment: No/denies pain Faces Pain Scale: No hurt    Home Living Family/patient expects to be discharged to:: Private residence Living Arrangements: Spouse/significant other Available Help at Discharge: Family Type of Home: House Home Access: Stairs to enter   Technical brewer of Steps: 1 Home Layout: One level Home Equipment: None      Prior Function Level of Independence: Independent         Comments: worked driving dump Aeronautical engineer        Extremity/Trunk Assessment   Upper Extremity Assessment Upper Extremity Assessment: Generalized weakness    Lower Extremity Assessment Lower Extremity Assessment: Generalized weakness       Communication   Communication: No difficulties  Cognition Arousal/Alertness: Awake/alert Behavior During Therapy: WFL for tasks assessed/performed Overall Cognitive Status: Within Functional Limits for tasks assessed                                 General Comments: seems WFL, but a little slow to respond at times  General Comments General comments (skin integrity, edema, etc.): VSS througout    Exercises     Assessment/Plan    PT Assessment Patient needs continued PT services  PT Problem List Decreased strength;Decreased activity tolerance;Decreased mobility;Cardiopulmonary status limiting activity;Decreased knowledge of use of DME;Decreased balance       PT Treatment Interventions DME instruction;Stair training;Therapeutic activities;Balance training;Therapeutic exercise;Functional mobility training;Gait training;Patient/family education    PT Goals  (Current goals can be found in the Care Plan section)  Acute Rehab PT Goals Patient Stated Goal: to go home PT Goal Formulation: With patient Time For Goal Achievement: 04/23/19 Potential to Achieve Goals: Good    Frequency Min 3X/week   Barriers to discharge        Co-evaluation               AM-PAC PT "6 Clicks" Mobility  Outcome Measure Help needed turning from your back to your side while in a flat bed without using bedrails?: A Little Help needed moving from lying on your back to sitting on the side of a flat bed without using bedrails?: A Little Help needed moving to and from a bed to a chair (including a wheelchair)?: A Little Help needed standing up from a chair using your arms (e.g., wheelchair or bedside chair)?: A Little Help needed to walk in hospital room?: Total Help needed climbing 3-5 steps with a railing? : Total 6 Click Score: 14    End of Session Equipment Utilized During Treatment: Oxygen Activity Tolerance: Patient tolerated treatment well Patient left: in chair;with call bell/phone within reach Nurse Communication: Mobility status PT Visit Diagnosis: Other abnormalities of gait and mobility (R26.89);Muscle weakness (generalized) (M62.81)    Time: 4695-0722 PT Time Calculation (min) (ACUTE ONLY): 21 min   Charges:   PT Evaluation $PT Eval Moderate Complexity: [place], [place] Acute Rehabilitation Services [phone] 04/09/2019   Stephen Jennings 04/09/2019, 3:01 PM

## 2019-04-09 NOTE — Progress Notes (Addendum)
Advanced Heart Failure Rounding Note  PCP-Cardiologist: No primary care provider on file.   Subjective:    Extubated 8/14. Had torsades 8/15 requiring shock and re-intubation.   Extubated 8/16.   Remains on milrinone 0.125 mcg + amio 30 mg per hour. Co-ox 60%  Remains confused.    Objective:   Weight Range: 85.7 kg Body mass index is 27.89 kg/m.   Vital Signs:   Temp:  [97.8 F (36.6 C)-100.5 F (38.1 C)] 98.2 F (36.8 C) (08/18 0353) Pulse Rate:  [70-84] 79 (08/18 0500) Resp:  [24-46] 44 (08/18 0500) BP: (112-157)/(50-107) 146/83 (08/18 0500) SpO2:  [92 %-100 %] 96 % (08/18 0727) Arterial Line BP: (101-181)/(52-100) 101/74 (08/18 0500) Weight:  [85.7 kg] 85.7 kg (08/18 0500) Last BM Date: 04/07/19  Weight change: Filed Weights   04/07/19 0500 04/08/19 0405 04/09/19 0500  Weight: 91.2 kg 86.8 kg 85.7 kg    Intake/Output:   Intake/Output Summary (Last 24 hours) at 04/09/2019 0735 Last data filed at 04/09/2019 0545 Gross per 24 hour  Intake 806.54 ml  Output 1125 ml  Net -318.46 ml      Physical Exam   General:   No resp difficulty. Sitting in the chair.  HEENT: normal Neck: supple. JVP 5-6  Carotids 2+ bilat; no bruits. No lymphadenopathy or thryomegaly appreciated. LIJ Cor: PMI nondisplaced. Regular rate & rhythm. No rubs, gallops or murmurs. Lungs: Rhonchi throughout on 2 liters.  Abdomen: soft, nontender, nondistended. No hepatosplenomegaly. No bruits or masses. Good bowel sounds. Extremities: no cyanosis, clubbing, rash, edema Neuro: alert & orientedx1, cranial nerves grossly intact. moves all 4 extremities w/o difficulty. Affect pleasant   Telemetry  SR 70-80s with occasional PVCs.   EKG    N/a   Labs    CBC Recent Labs    04/09/19 0100 04/09/19 0615  WBC 8.2 8.5  NEUTROABS  --  6.4  HGB 8.6* 9.6*  HCT 27.1* 29.9*  MCV 93.8 94.6  PLT 390 193*   Basic Metabolic Panel Recent Labs    04/08/19 0400 04/09/19 0423  NA 139 141   K 3.2* 3.6  CL 104 109  CO2 24 23  GLUCOSE 127* 98  BUN 24* 24*  CREATININE 0.84 0.82  CALCIUM 8.3* 8.3*  MG 1.8 2.0   Liver Function Tests No results for input(s): AST, ALT, ALKPHOS, BILITOT, PROT, ALBUMIN in the last 72 hours. No results for input(s): LIPASE, AMYLASE in the last 72 hours. Cardiac Enzymes No results for input(s): CKTOTAL, CKMB, CKMBINDEX, TROPONINI in the last 72 hours.  BNP: BNP (last 3 results) Recent Labs    03/31/19 0013  BNP 1,247.4*    ProBNP (last 3 results) No results for input(s): PROBNP in the last 8760 hours.   D-Dimer No results for input(s): DDIMER in the last 72 hours. Hemoglobin A1C No results for input(s): HGBA1C in the last 72 hours. Fasting Lipid Panel Recent Labs    04/09/19 0423  TRIG 88   Thyroid Function Tests No results for input(s): TSH, T4TOTAL, T3FREE, THYROIDAB in the last 72 hours.  Invalid input(s): FREET3  Other results:   Imaging    No results found.   Medications:     Scheduled Medications: . sodium chloride   Intravenous Once  . aspirin  81 mg Oral Daily  . atorvastatin  80 mg Oral q1800  . chlorhexidine  15 mL Mouth Rinse BID  . Chlorhexidine Gluconate Cloth  6 each Topical Daily  . digoxin  0.125  mg Oral Daily  . heparin injection (subcutaneous)  5,000 Units Subcutaneous Q8H  . insulin aspart  0-9 Units Subcutaneous Q4H  . insulin aspart  3 Units Subcutaneous Q4H  . insulin glargine  15 Units Subcutaneous Daily  . ipratropium-albuterol  3 mL Nebulization TID  . mouth rinse  15 mL Mouth Rinse q12n4p  . pantoprazole (PROTONIX) IV  40 mg Intravenous Daily  . sodium chloride flush  10-40 mL Intracatheter Q12H  . sorbitol  30 mL Per Tube Once  . spironolactone  12.5 mg Oral Daily  . Thrombi-Pad  1 each Topical Once  . ticagrelor  90 mg Oral BID    Infusions: . sodium chloride Stopped (04/06/19 0930)  . amiodarone 30 mg/hr (04/09/19 0422)  . ceFEPime (MAXIPIME) IV 2 g (04/09/19 0544)  .  feeding supplement (VITAL 1.5 CAL) Stopped (04/04/19 1600)  . milrinone 0.125 mcg/kg/min (04/09/19 0400)  . potassium chloride 10 mEq (04/09/19 0702)    PRN Medications: acetaminophen, ipratropium-albuterol, nitroGLYCERIN, ondansetron (ZOFRAN) IV, sodium chloride flush   *  Assessment/Plan    1. Cardiac arrest/VT/torsades on 8/15 - rhythm stable on IV amio - Keep K> 4.0 Mg > 2.0 - if recovers will need Lifevest vs ICD prior to ICD  2. Acute systolic HF -> Cardiogenic shock due to acute anterior MI - s/p PCI/DES to LAD on 8/9 - Impella removed 8/12 - Off NE - Continue  Milrinone 0.125. CO-OX 84%. Repeat now.   - Continue digoxin 0.125  - Increase spiro to 25 mg daily.  Add 12.5 losartan daily.  - No BB for now.  -Renal function stable  - Echo 20-25%  3. CAD with acute anterior MI 8/9 - s/p PCI/DES to LAD. 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 Acute hypoxic respiratory failure - in setting of #1 - extubated 8/14. Reintubated 8/15 in setting of torsades. Extubated 8/16.  - Respiratory status again tenuous today   4. DM2 - new diagnosis - cover with SSI  5. COPD - ongoing tobacco use - needs cessation   6. NSVT/Torsades - Improved on amio drip. Has had > 10 gram load amio. Switch to amio 200 mg twice a day.   - Keep K> 4.0 Mg > 2.0 - Supp K .  - Mag 2.    7. Anemia, acute blood loss - Got 1u RBC on 8/15.  - Hgb stable today 9.6   8. Possible ileus.  - KUB 8/16 no ileus   Consult PT/OT today. Speech consult appreciated. NPO except meds/ice chips.     Length of Stay: [place], NP  04/09/2019, 7:35 AM  Advanced Heart Failure Team Pager [phone] (M-F; 7a - 4p)  Please contact [facility] for night-coverage after hours (4p -7a ) and weekends on [email or web address]   Patient seen and examined with the above-signed Advanced Practice Provider and/or Housestaff.  I personally reviewed laboratory data, imaging studies and relevant notes. I independently examined the patient and formulated the important aspects of the plan. I have edited the note to reflect any of my changes or salient points. I have personally discussed the plan with the patient and/or family.  Hemodynamically a bit more stable. Volume ok. Co-ox marginal but improved. Maintaining NSR. Rhythm stable. Respiratory stable.   Main issue now is delirium. Failed swallow study except for meds.   Will change amio to po. Agree with spiro and losartan.  Hopefully can get out of ICU soon.  PT/OT to see.   [name], MD  8:15 AM

## 2019-04-09 NOTE — Progress Notes (Signed)
CR following for mobility progression. Noted pt does not have a PT or OT order. Please order if you agree.  Yves Dill CES, ACSM 7:43 AM 04/09/2019

## 2019-04-09 NOTE — Progress Notes (Signed)
[facility] Progress Note Patient Name: Stephen Jennings DOB: Sep 20, 1955 MRN: [id number]   Date of Service  04/09/2019  HPI/Events of Note  Multiple issues: 1. K+ = 3.6 and Creatinine = 0.82 and 2. Hgb = 6.9 on COOX.  eICU Interventions  Will order: 1. Replace K+. 2. CBC with platelet count now.      Intervention Category Major Interventions: Electrolyte abnormality - evaluation and management;Other:  Sommer,Steven Cornelia Copa 04/09/2019, 6:15 AM

## 2019-04-09 NOTE — Progress Notes (Signed)
Nutrition Follow-up  DOCUMENTATION CODES:   Not applicable  INTERVENTION:    Ensure Enlive po BID, each supplement provides 350 kcal and 20 grams of protein  MVI with minerals   NUTRITION DIAGNOSIS:   Inadequate oral intake related to inability to eat as evidenced by NPO status.  Diet advanced   GOAL:   Patient will meet greater than or equal to 90% of their needs  Diet advanced   MONITOR:   Labs, Weight trends, TF tolerance, I & O's  REASON FOR ASSESSMENT:   Ventilator, Consult Enteral/tube feeding initiation and management  ASSESSMENT:   63 year old male who presented to the ED on 8/09 with severe respiratory distress. PMH of COPD. Pt required intubation in the ED. Pt found to have STEMI. CXR showing pulmonary edema. Pt brought to the cath lab. Angiography demonstrated an occluded proximal LAD which was successfully stented. Pt now with cardiogenic shock. Impella placed.   8/9 - PCI with a DES and Impella placement, code/CPR 8/12- Impella removed  8/14- extubated  8/15- cardiac arrest, intubated   8/16- extubated, KUB show no ileus  Pt discussed during ICU rounds and with RN.   Requiring milrinone. Likely will need lifevest prior to ICD. Had FEEs this afternoon- diet advanced to full liquids. Pt seemed confused at bedside but willing to try Ensure.   Admission weight: 83.9 Current weight: 85.7 kg   I/O: +3,457 ml since admit UOP: 1,125 ml x 24 hrs   Drips: milrinone Medications: SS novolog, lantus, aldactone Labs: CBG 87-144   Diet Order:   Diet Order            Diet full liquid Room service appropriate? Yes; Fluid consistency: Nectar Thick  Diet effective now              EDUCATION NEEDS:   No education needs have been identified at this time  Skin:  Skin Assessment: Reviewed RN Assessment  Last BM:  8/16  Height:   Ht Readings from Last 1 Encounters:  03/31/19 5' 9.02" (1.753 m)    Weight:   Wt Readings from Last 1 Encounters:   04/09/19 85.7 kg    Ideal Body Weight:  72.7 kg  BMI:  Body mass index is 27.89 kg/m.  Estimated Nutritional Needs:   Kcal:  2000-2200 kcal  Protein:  100-120 grams  Fluid:  >/= 2 L/day   Mariana Single RD, LDN Clinical Nutrition Pager # - [phone]

## 2019-04-09 NOTE — Progress Notes (Signed)
  Speech Language Pathology Treatment: Dysphagia  Patient Details Name: Stephen Jennings MRN: [id number] DOB: 21-Sep-1955 Today's Date: 04/09/2019 Time: 3354-5625 SLP Time Calculation (min) (ACUTE ONLY): 19 min  Assessment / Plan / Recommendation Clinical Impression  Pt has less overt coughing with PO intake today, but still has intermittent delayed coughs that remain weak. SLP provided Min-Mod cues for more effortful coughs, smaller boluses. His WOB is not as labored compared to initial evaluation on previous date. Suspect that he may be ready for an oral diet, but given that he remains more tenuous and his cough is week, would pursue instrumental testing prior to initiating a PO diet. Discussed with RN - will attempt MBS today.   HPI HPI: 65M with no known PMHx who presented with respiratory distress via EMS, intubated in the ED 8/9.  Code STEMI was called and patient was taken to the cath lab with complete occlusion of the LAD with cardiogenic shock. Pt was extubated 8/14 but was reintubated on 8/15 when he suffered a cardiac arrest requiring defibrillation x2, 2 rounds of epi approximately 10 minutes CPR with ROSC. He was extubated 8/16. Diet had been initiated, but RN noticed coughing with PO intake.      SLP Plan  MBS       Recommendations  Diet recommendations: NPO;Other(comment)(ice chips okay after oral care) Medication Administration: Whole meds with puree(crush larger ones)                Oral Care Recommendations: Oral care QID Follow up Recommendations: (tba) SLP Visit Diagnosis: Dysphagia, unspecified (R13.10) Plan: MBS       GO                Venita Sheffield Ediel Unangst 04/09/2019, 9:37 AM  [name], M.A. [facility] Acute Environmental education officer [phone] Office [phone]

## 2019-04-10 DIAGNOSIS — I4901 Ventricular fibrillation: Secondary | ICD-10-CM

## 2019-04-10 DIAGNOSIS — I255 Ischemic cardiomyopathy: Secondary | ICD-10-CM

## 2019-04-10 LAB — BASIC METABOLIC PANEL
Anion gap: 8 (ref 5–15)
BUN: 24 mg/dL — ABNORMAL HIGH (ref 8–23)
CO2: 24 mmol/L (ref 22–32)
Calcium: 8.5 mg/dL — ABNORMAL LOW (ref 8.9–10.3)
Chloride: 110 mmol/L (ref 98–111)
Creatinine, Ser: 0.82 mg/dL (ref 0.61–1.24)
GFR calc Af Amer: >60 mL/min (ref >60)
GFR calc non Af Amer: >60 mL/min (ref >60)
Glucose, Bld: 99 mg/dL (ref 70–99)
Potassium: 3.7 mmol/L (ref 3.5–5.1)
Sodium: 142 mmol/L (ref 135–145)

## 2019-04-10 LAB — CBC
HCT: 29.9 % — ABNORMAL LOW (ref 39.0–52.0)
HCT: 30.3 % — ABNORMAL LOW (ref 39.0–52.0)
Hemoglobin: 9.5 g/dL — ABNORMAL LOW (ref 13.0–17.0)
Hemoglobin: 9.5 g/dL — ABNORMAL LOW (ref 13.0–17.0)
MCH: 29.9 pg (ref 26.0–34.0)
MCH: 30 pg (ref 26.0–34.0)
MCHC: 31.4 g/dL (ref 30.0–36.0)
MCHC: 31.8 g/dL (ref 30.0–36.0)
MCV: 94 fL (ref 80.0–100.0)
MCV: 95.6 fL (ref 80.0–100.0)
Platelets: 557 10*3/uL — ABNORMAL HIGH (ref 150–400)
Platelets: 606 10*3/uL — ABNORMAL HIGH (ref 150–400)
RBC: 3.17 MIL/uL — ABNORMAL LOW (ref 4.22–5.81)
RBC: 3.18 MIL/uL — ABNORMAL LOW (ref 4.22–5.81)
RDW: 14.5 % (ref 11.5–15.5)
RDW: 14.6 % (ref 11.5–15.5)
WBC: 11.5 10*3/uL — ABNORMAL HIGH (ref 4.0–10.5)
WBC: 9.1 10*3/uL (ref 4.0–10.5)
nRBC: 0 % (ref 0.0–0.2)
nRBC: 0 % (ref 0.0–0.2)

## 2019-04-10 LAB — GLUCOSE, CAPILLARY
Glucose-Capillary: 98 mg/dL (ref 70–99)
Glucose-Capillary: 132 mg/dL — ABNORMAL HIGH (ref 70–99)
Glucose-Capillary: 118 mg/dL — ABNORMAL HIGH (ref 70–99)
Glucose-Capillary: 125 mg/dL — ABNORMAL HIGH (ref 70–99)
Glucose-Capillary: 96 mg/dL (ref 70–99)

## 2019-04-10 LAB — MAGNESIUM: Magnesium: 1.9 mg/dL (ref 1.7–2.4)

## 2019-04-10 LAB — COOXEMETRY PANEL
Carboxyhemoglobin: 1.6 % — ABNORMAL HIGH (ref 0.5–1.5)
Methemoglobin: 0.6 % (ref 0.0–1.5)
O2 Saturation: 61.4 %
Total hemoglobin: 12.9 g/dL (ref 12.0–16.0)

## 2019-04-10 LAB — LACTATE DEHYDROGENASE: LDH: 277 U/L — ABNORMAL HIGH (ref 98–192)

## 2019-04-10 MED ORDER — POTASSIUM CHLORIDE CRYS ER 20 MEQ PO TBCR
40.0000 meq | EXTENDED_RELEASE_TABLET | Freq: Once | ORAL | Status: AC
  Administered 2019-04-10: 40 meq via ORAL
  Filled 2019-04-10: qty 2

## 2019-04-10 MED ORDER — SACUBITRIL-VALSARTAN 24-26 MG PO TABS
1.0000 | ORAL_TABLET | Freq: Two times a day (BID) | ORAL | Status: DC
  Administered 2019-04-10 – 2019-04-15 (×8): 1 via ORAL
  Filled 2019-04-10 (×13): qty 1

## 2019-04-10 MED ORDER — MAGNESIUM SULFATE 2 GM/50ML IV SOLN
2.0000 g | Freq: Once | INTRAVENOUS | Status: AC
  Administered 2019-04-10: 09:00:00 2 g via INTRAVENOUS
  Filled 2019-04-10: qty 50

## 2019-04-10 NOTE — Evaluation (Addendum)
Occupational Therapy Evaluation Patient Details Name: Stephen Jennings MRN: [id number] DOB: 1956/04/14 Today's Date: 04/10/2019    History of Present Illness Pt is a 63 y/o M with no known PMHx who presented with respiratory distress via EMS, intubated in the ED 8/9.  Code STEMI was called and patient was taken to the cath lab with complete occlusion of the LAD with cardiogenic shock. Pt was extubated 8/14 but was reintubated on 8/15 when he suffered a cardiac arrest requiring defibrillation x2, 2 rounds of epi approximately 10 minutes CPR with ROSC. He was extubated 8/16.   Clinical Impression   PTA patient independent and working. Admitted for above and limited by problem list below, including impaired cognition, decreased activity tolerance, impaired balance.  He currently requires min guard for transfers, LB ADLs and grooming standing at sink.  Min assist for mobility in room using RW, poor safety awareness.  Cognitively assessed with short blessed test, scoring 10/28 (impairment- consistent with dementia) with deficits noted in sequencing, attention and memory. VSS during session. He will require 24/7 support at discharge, reports his spouse (wc bound) and his sister are available.  He will benefit from continued OT services while admitted and after dc at [facility] level in order to maximize independence and safety with ADLs/IADLs upon dc home.       Follow Up Recommendations  Home health OT;Supervision/Assistance - 24 hour    Equipment Recommendations  3 in 1 bedside commode    Recommendations for Other Services Speech consult     Precautions / Restrictions Precautions Precautions: Fall Restrictions Weight Bearing Restrictions: No      Mobility Bed Mobility               General bed mobility comments: OOB in recliner upon entry   Transfers Overall transfer level: Needs assistance Equipment used: Rolling walker (2 wheeled) Transfers: Sit to/from Stand Sit to Stand: Min guard          General transfer comment: cueing for hand placement and safety, min guard for balance but not physical assist required     Balance Overall balance assessment: Needs assistance Sitting-balance support: Feet supported Sitting balance-Leahy Scale: Fair     Standing balance support: Bilateral upper extremity supported;No upper extremity supported;During functional activity Standing balance-Leahy Scale: Poor Standing balance comment: able to engage in grooming standing at sink with min guard but reliant on BUE support dynamically                            ADL either performed or assessed with clinical judgement   ADL Overall ADL's : Needs assistance/impaired     Grooming: Min guard;Wash/dry hands;Standing   Upper Body Bathing: Set up;Supervision/ safety;Sitting   Lower Body Bathing: Min guard;Sit to/from stand Lower Body Bathing Details (indicate cue type and reason): figure 4 to reach feet, min guard sit <>stand  Upper Body Dressing : Set up;Sitting   Lower Body Dressing: Min guard;Sit to/from stand Lower Body Dressing Details (indicate cue type and reason): demonstrates ability to don/doff socks with supervision seated, min guard sit to stand  Toilet Transfer: Min guard;Ambulation;RW Toilet Transfer Details (indicate cue type and reason): simulated to/from recliner, cueing for safeyt          Functional mobility during ADLs: Rolling walker;Minimal assistance General ADL Comments: pt limited by cognition, safety awareness, and balance      Vision   Vision Assessment?: No apparent visual deficits  Perception     Praxis      Pertinent Vitals/Pain Pain Assessment: No/denies pain     Hand Dominance     Extremity/Trunk Assessment Upper Extremity Assessment Upper Extremity Assessment: Generalized weakness   Lower Extremity Assessment Lower Extremity Assessment: Defer to PT evaluation       Communication Communication Communication: No  difficulties   Cognition Arousal/Alertness: Awake/alert Behavior During Therapy: WFL for tasks assessed/performed Overall Cognitive Status: Impaired/Different from baseline Area of Impairment: Safety/judgement;Attention;Problem solving;Memory;Following commands;Awareness                   Current Attention Level: Sustained Memory: Decreased recall of precautions;Decreased short-term memory Following Commands: Follows one step commands with increased time;Follows multi-step commands inconsistently Safety/Judgement: Decreased awareness of safety;Decreased awareness of deficits Awareness: Emergent Problem Solving: Slow processing;Requires verbal cues;Difficulty sequencing General Comments: patient alert and oriented, cooperative and engages appropriately.  Short blessed test completed: 10/28 (impairment) with difficutlies noted with attention, STM and sequencing    General Comments  SpO2 on RA 92% or greater; dropped during mobility but poor waveform and recovered once waveform improved    Exercises     Shoulder Instructions      Home Living Family/patient expects to be discharged to:: Private residence Living Arrangements: Spouse/significant other;Other relatives(sister) Available Help at Discharge: Family Type of Home: House Home Access: Stairs to enter Technical brewer of Steps: 1   Home Layout: One level     Bathroom Shower/Tub: Occupational psychologist: Standard     Home Equipment: None   Additional Comments: reports spouse is in w/c, sister is also available to help       Prior Functioning/Environment Level of Independence: Independent        Comments: worked driving dump Personal assistant Problem List: Decreased activity tolerance;Impaired balance (sitting and/or standing);Decreased cognition;Decreased safety awareness;Decreased knowledge of use of DME or AE;Decreased knowledge of precautions      OT  Treatment/Interventions: Self-care/ADL training;DME and/or AE instruction;Therapeutic activities;Cognitive remediation/compensation;Patient/family education;Balance training    OT Goals(Current goals can be found in the care plan section) Acute Rehab OT Goals Patient Stated Goal: to go home OT Goal Formulation: With patient Time For Goal Achievement: 04/24/19 Potential to Achieve Goals: Good  OT Frequency: Min 2X/week   Barriers to D/C:            Co-evaluation              AM-PAC OT "6 Clicks" Daily Activity     Outcome Measure Help from another person eating meals?: A Little Help from another person taking care of personal grooming?: A Little Help from another person toileting, which includes using toliet, bedpan, or urinal?: A Little Help from another person bathing (including washing, rinsing, drying)?: A Little Help from another person to put on and taking off regular upper body clothing?: A Little Help from another person to put on and taking off regular lower body clothing?: A Little 6 Click Score: 18   End of Session Equipment Utilized During Treatment: Gait belt;Rolling walker Nurse Communication: Mobility status  Activity Tolerance: Patient tolerated treatment well Patient left: in chair;with call bell/phone within reach;with chair alarm set  OT Visit Diagnosis: Other abnormalities of gait and mobility (R26.89);Other symptoms and signs involving cognitive function                Time: 1415-1438 OT Time Calculation (min): 23 min Charges:  OT General Charges $OT  Visit: 1 Visit OT Evaluation $OT Eval Moderate Complexity: 1 Mod OT Treatments $Self Care/Home Management : 8-22 mins  Delight Stare, OT Acute Rehabilitation Services Pager [phone] Office [phone]    Delight Stare 04/10/2019, 3:21 PM

## 2019-04-10 NOTE — Consult Note (Addendum)
Cardiology Consultation:   Patient ID: Stephen Jennings MRN: [id number]; DOB: [birth year]  Admit date: 03/31/2019 Date of Consult: 04/10/2019  Primary Care Provider: No primary care provider on file. Primary Cardiologist: none prior Primary Electrophysiologist:  None    Patient Profile:   Stephen Jennings is a 63 y.o. male with a hx of COPD, smoker, no other known PMHx until this admission with STEMI who is being seen today for the evaluation of Torsades at the request of Stephen Jennings.  History of Present Illness:   Stephen Jennings was admitted to [facility] with STEMI 03/31/2019, he underwent emergent cath with totally occluded prox LAD. LCX anomalous off RCA. Underwent PCI of LAD which was diffusely diseased vessel, his cath complicated by shock requiring pressors, Impella and was intubated  He was extubated 04/05/2019, early morning of 04/06/2019 he had cardiac arrest, code note mentions pulseless V-tach and returned to sinus with 67m epi x2 and 2 cardioversion and was poorly responsive immediately afterwards and re-intubate, appears he was already on amio gtt at the time of his arrest, continued Stephen Jennings note reports event was Torsades. K+ was 3.7, 3.8 Mag 1.8 He had 1u PRBC for hgb 7.2  Trops not repeated  Extubated 8/16  EP is asked to weigh in life vest vs ICD implant prior to discharge  LABS today K+ 3.7 Mag 1.9 BUN/Creat 24/0.82 WBC 9.1 H/H 9.5/30.3 Plts 557  The patient is happy to be out of ICU, denies any complaints currently, would like a [employer].   Heart Pathway Score:     Past Medical History:  Diagnosis Date  . Medical history non-contributory     Past Surgical History:  Procedure Laterality Date  . CORONARY/GRAFT ACUTE MI REVASCULARIZATION N/A 03/31/2019   Procedure: Coronary/Graft Acute MI Revascularization;  Surgeon: Stephen Sine, MD;  Location: [place] [facility];  Service: Cardiovascular;  Laterality: N/A;  . LEFT HEART CATH AND CORONARY ANGIOGRAPHY N/A  03/31/2019   Procedure: LEFT HEART CATH AND CORONARY ANGIOGRAPHY;  Surgeon: Stephen Sine, MD;  Location: [place] [facility];  Service: Cardiovascular;  Laterality: N/A;  . NO PAST SURGERIES    . RIGHT HEART CATH N/A 03/31/2019   Procedure: RIGHT HEART CATH;  Surgeon: Stephen Sine, MD;  Location: [place] [facility];  Service: Cardiovascular;  Laterality: N/A;  . VENTRICULAR ASSIST DEVICE INSERTION N/A 03/31/2019   Procedure: VENTRICULAR ASSIST DEVICE INSERTION;  Surgeon: Stephen Sine, MD;  Location: [place] [facility];  Service: Cardiovascular;  Laterality: N/A;     Home Medications:  Prior to Admission medications   Medication Sig Start Date End Date Taking? Authorizing Provider  Naphazoline HCl (CLEAR EYES OP) Place 1 drop into both eyes daily as needed (dry eyes/ irritation).   Yes [provider]    Inpatient Medications: Scheduled Meds: . sodium chloride   Intravenous Once  . amiodarone  200 mg Oral BID  . aspirin  81 mg Oral Daily  . atorvastatin  80 mg Oral q1800  . chlorhexidine  15 mL Mouth Rinse BID  . Chlorhexidine Gluconate Cloth  6 each Topical Daily  . digoxin  0.125 mg Oral Daily  . feeding supplement (ENSURE ENLIVE)  237 mL Oral BID BM  . heparin injection (subcutaneous)  5,000 Units Subcutaneous Q8H  . insulin aspart  0-9 Units Subcutaneous Q4H  . insulin aspart  3 Units Subcutaneous Q4H  . insulin glargine  15 Units Subcutaneous Daily  . ipratropium-albuterol  3 mL Nebulization TID  .  mouth rinse  15 mL Mouth Rinse q12n4p  . pantoprazole (PROTONIX) IV  40 mg Intravenous Daily  . sacubitril-valsartan  1 tablet Oral BID  . sodium chloride flush  10-40 mL Intracatheter Q12H  . sorbitol  30 mL Per Tube Once  . spironolactone  25 mg Oral Daily  . Thrombi-Pad  1 each Topical Once  . ticagrelor  90 mg Oral BID   Continuous Infusions: . sodium chloride Stopped (04/06/19 0930)   PRN Meds: acetaminophen, ipratropium-albuterol, nitroGLYCERIN, ondansetron  (ZOFRAN) IV, Resource ThickenUp Clear, sodium chloride flush  Allergies:   No Known Allergies  Social History:   Social History   Socioeconomic History  . Marital status: Single    Spouse name: Not on file  . Number of children: Not on file  . Years of education: Not on file  . Highest education level: Not on file  Occupational History  . Not on file  Social Needs  . Financial resource strain: Not on file  . Food insecurity    Worry: Not on file    Inability: Not on file  . Transportation needs    Medical: Not on file    Non-medical: Not on file  Tobacco Use  . Smoking status: Current Every Day Smoker  . Smokeless tobacco: Never Used  Substance and Sexual Activity  . Alcohol use: No  . Drug use: No  . Sexual activity: Not on file  Lifestyle  . Physical activity    Days per week: Not on file    Minutes per session: Not on file  . Stress: Not on file  Relationships  . Social Herbalist on phone: Not on file    Gets together: Not on file    Attends religious service: Not on file    Active member of club or organization: Not on file    Attends meetings of clubs or organizations: Not on file    Relationship status: Not on file  . Intimate partner violence    Fear of current or ex partner: Not on file    Emotionally abused: Not on file    Physically abused: Not on file    Forced sexual activity: Not on file  Other Topics Concern  . Not on file  Social History Narrative  . Not on file    Family History:   Family History  Family history unknown: Yes     ROS:  Please see the history of present illness.  All other ROS reviewed and negative.     Physical Exam/Data:   Vitals:   04/10/19 0700 04/10/19 0800 04/10/19 0826 04/10/19 0906  BP: 137/72 129/74    Pulse: 71 86  80  Resp: (!) 31 (!) 41    Temp:   98 F (36.7 C)   TempSrc:   Oral   SpO2: 96% 97%    Weight:      Height:        Intake/Output Summary (Last 24 hours) at 04/10/2019 1023 Last  data filed at 04/10/2019 0800 Gross per 24 hour  Intake 959.13 ml  Output 725 ml  Net 234.13 ml   Last 3 Weights 04/10/2019 04/09/2019 04/08/2019  Weight (lbs) 187 lb 13.3 oz 188 lb 15 oz 191 lb 5.8 oz  Weight (kg) 85.2 kg 85.7 kg 86.8 kg     Body mass index is 27.73 kg/m.  General:  Well nourished, well developed, in no acute distress HEENT: normal Lymph: no adenopathy Neck: 6 JVD  Endocrine:  No thryomegaly Vascular: No carotid bruits  Cardiac:  RRR; soft SM Lungs:  CTA b/l,  no wheezing, rhonchi or rales  Abd: soft, nontender  Ext: no edema Musculoskeletal:  No deformities, BUE and BLE strength normal and equal Skin: warm and dry  Neuro:  no gross focal abnormalities noted Psych:  Normal affect   EKG:  The EKG was personally reviewed and demonstrates:    SR, 107, diffuse ST/T changes, elevation anterior SR 65, improved ST/T changes SR 72, baseline motion, ST/T changes better  Telemetry:  Telemetry was personally reviewed and demonstrates:   SR, no significant ectopy, rare PVCs 04/06/2019 PMVT > VF event  Relevant CV Studies:  03/31/2019: TTE IMPRESSIONS  1. The left ventricle has severely reduced systolic function, with an ejection fraction of 20-25%. The cavity size was moderately dilated. Left ventricular diastolic function could not be evaluated.  2. Septal apical and anterior wall hyppkinesis Impella device seen in LV extending to mid ventrical CW through AV suggests low output.  3. The right ventricle has moderately reduced systolic function. The cavity was mildly enlarged. There is no increase in right ventricular wall thickness.  4. Left atrial size was moderately dilated.  5. Right atrial size was mildly dilated.  6. The aortic valve was not well visualized.  7. Impella device seen coursing across AV.  8. The aorta is normal in size and structure.  9. The interatrial septum was not well visualized.  03/31/2019: R/LHC w/PCI to LAD/Diag  Mid LM to Prox LAD lesion  is 100% stenosed.  Mid LAD lesion is 100% stenosed.  A drug-eluting stent was successfully placed using a STENT RESOLUTE ONYX 2.75X26.  Post intervention, there is a 0% residual stenosis.  Post intervention, there is a 0% residual stenosis.  Prox RCA lesion is 70% stenosed.  Post intervention, there is a 0% residual stenosis.   Respiratory distress and cardiac shock secondary to ST segment elevation anterior wall myocardial infarction.  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  The RCA has 70% stenosis in its proximal segment.  There is faint collateralization to small calibered distal LAD and diagonal vessels arising from the right and circumflex vessels.  Total proximal occlusion of the LAD with TIMI 0 flow.  Difficult but successful intervention to the LAD PTCA and stenting of the proximal LAD, PTCA of the ostium of the diagonal vessel and PTCA of the mid LAD with ultimate insertion of a 2.75 x 26 mm Resolute Onyx stent of the proximal LAD to just before the diagonal bifurcation.  Mid distal LAD and diagonal vessels are very small caliber.  Right heart catheterization demonstrated  RV volume overload. Cardiogenic shock requiring insertion of an Impella CR regular assist device.  RECOMMENDATION: The patient ultimately stabilized his hemodynamics and was transported to the coronary care unit on a levophed an epinephrine drip in addition to Aggrastat, Angiomax, cangrelor, propofol.  Brilinta will be inserted via OG in the CCU and cangrelor will subsequently be discontinued.  Plan  for echo Doppler study.   Laboratory Data:  High Sensitivity Troponin:   Recent Labs  Lab 03/31/19 0013 03/31/19 0503 03/31/19 1204 03/31/19 1552  TROPONINIHS 465* 5,839* 15,081* 17,032*     Cardiac EnzymesNo results for input(s): TROPONINI in the last 168 hours. No results for input(s):  TROPIPOC in the last 168 hours.  Chemistry Recent Labs  Lab 04/08/19 0400 04/09/19 0423 04/10/19 0515  NA  139 141 142  K 3.2* 3.6 3.7  CL 104 109 110  CO2 24 23 24   GLUCOSE 127* 98 99  BUN 24* 24* 24*  CREATININE 0.84 0.82 0.82  CALCIUM 8.3* 8.3* 8.5*  GFRNONAA >60 >60 >60  GFRAA >60 >60 >60  ANIONGAP 11 9 8     No results for input(s): PROT, ALBUMIN, AST, ALT, ALKPHOS, BILITOT in the last 168 hours. Hematology Recent Labs  Lab 04/09/19 0615 04/09/19 1219 04/10/19 0018  WBC 8.5 8.8 9.1  RBC 3.16* 3.23* 3.17*  HGB 9.6* 9.8* 9.5*  HCT 29.9* 30.8* 30.3*  MCV 94.6 95.4 95.6  MCH 30.4 30.3 30.0  MCHC 32.1 31.8 31.4  RDW 14.7 14.8 14.6  PLT 446* 488* 557*   BNPNo results for input(s): BNP, PROBNP in the last 168 hours.  DDimer No results for input(s): DDIMER in the last 168 hours.   Radiology/Studies:   Dg Chest Port 1 View Result Date: 04/09/2019 CLINICAL DATA:  Shortness of breath EXAM: PORTABLE CHEST 1 VIEW COMPARISON:  04/06/2019 FINDINGS: Cardiac shadow is stable. Endotracheal tube and gastric catheter have been removed. Left jugular central line is again seen in the mid SVC. The lungs are well aerated with increasing right effusion and basilar infiltrate when compared with the prior exam. No bony abnormality is noted. IMPRESSION: Increased right basilar infiltrate and effusion. Electronically Signed   By: Inez Catalina M.D.   On: 04/09/2019 07:45         Assessment and Plan:   1. STEMI (anterior wall) > PCI to LAD/Diag, 03/31/2019     Associated with shock, Impella, intubated, pressors     TTE with LVEF 20-25% same day  Extubated 8/14 Torsdes arrest 8/15 > re-intubated Extubated 04/07/2019  on milrinone 0.125 today  > planned for stop today amio gtt >> PO yesterday No BB yet  S/p Aggrastat, Angiomax, cangrelor with his intervention Remains on ASA, Brilinta  He has ICM with LVEF 20-25% associated with STEMI and revascularization. He had arrest 6 days post  revascularization , given the timing,  I think will need ICD prior to discharge.   I have discussed this with the patient, possible ICD vs life vest MD will see later today         For questions or updates, please contact [place] Please consult [email or web address] for contact info under     Signed, Baldwin Jamaica, [name]  04/10/2019 10:23 AM  VF arrest, D 7 post MI  STEMI--anterior wall LAD PCI  Cx by shock and respiratory failure Requiring impella and milrinone/NE   Cardiomyopathy--Ischemic  EF 20-25 % (acutely)    Anemia    HTN  Tobacco abuse   The patient had a cardiac arrest on day 7 late post revascularization.  No acute triggers were identified.  Hence, it is appropriate to consider this as part of the convalescent/chronic phase of his MI, i.e. specifically not acute and hence unrelated to the event itself.  Therefore, he should be considered for secondary prevention ICD.  I have reviewed this with Dr. Reine Just.  I have discussed with Dr. Elliot Cousin.  All are in agreement.  The plan will need to be reviewed with the patient; I told him that I wanted to have this discussion with Dr. Reine Just prior to outlining it for him.  The patient's disposition will determine the timeline.  Currently discharge is anticipated in the next 48 to 72 hours.  We will plan to reassess him in the morning and make  a decision at that time as to whether we should proceed tomorrow or Friday.  I have not reviewed with him the issue of driving at this juncture because of concerns of his memory.  This will need to be addressed prior to discharge  ECG was reviewed from today.  No changes were noted compared to 04/01/2019.  Both were personally reviewed.

## 2019-04-10 NOTE — Progress Notes (Signed)
CARDIAC REHAB PHASE I   PRE:  Rate/Rhythm: 70 SR  BP:  Supine:   Sitting: 116/65  Standing:    SaO2: 92-93%RA  MODE:  Ambulation: 300 ft   POST:  Rate/Rhythm: 93 SR  BP:  Supine:   Sitting: 144/80  Standing:    SaO2: 92%RA 1305-1340 Pt willing to walk. Pt walked 300 ft on RA with gait belt use, rolling walker and asst x 2. Encouraged pt to stay close to walker. Gets easily distracted and then more unsteady.  To recliner with call bell and chair alarm. No c/o CP. Tolerated increase in distance well.   Graylon Good, RN BSN  04/10/2019 1:35 PM

## 2019-04-10 NOTE — Plan of Care (Signed)
  Problem: Education: Goal: Knowledge of General Education information will improve Description: Including pain rating scale, medication(s)/side effects and non-pharmacologic comfort measures Outcome: Progressing   Problem: Health Behavior/Discharge Planning: Goal: Ability to manage health-related needs will improve Outcome: Progressing   Problem: Clinical Measurements: Goal: Ability to maintain clinical measurements within normal limits will improve Outcome: Progressing Goal: Will remain free from infection Outcome: Progressing Goal: Diagnostic test results will improve Outcome: Progressing Goal: Respiratory complications will improve Outcome: Progressing Goal: Cardiovascular complication will be avoided Outcome: Progressing   Problem: Activity: Goal: Risk for activity intolerance will decrease Outcome: Progressing   Problem: Nutrition: Goal: Adequate nutrition will be maintained Outcome: Progressing   Problem: Coping: Goal: Level of anxiety will decrease Outcome: Progressing   Problem: Elimination: Goal: Will not experience complications related to bowel motility Outcome: Progressing Goal: Will not experience complications related to urinary retention Outcome: Progressing   Problem: Pain Managment: Goal: General experience of comfort will improve Outcome: Progressing   Problem: Safety: Goal: Ability to remain free from injury will improve Outcome: Progressing   Problem: Skin Integrity: Goal: Risk for impaired skin integrity will decrease Outcome: Progressing   Problem: Education: Goal: Ability to demonstrate management of disease process will improve Outcome: Progressing Goal: Ability to verbalize understanding of medication therapies will improve Outcome: Progressing Goal: Individualized Educational Video(s) Outcome: Progressing   Problem: Activity: Goal: Capacity to carry out activities will improve Outcome: Progressing   Problem: Cardiac: Goal:  Ability to achieve and maintain adequate cardiopulmonary perfusion will improve Outcome: Progressing   Problem: Activity: Goal: Ability to tolerate increased activity will improve Outcome: Progressing   Problem: Respiratory: Goal: Ability to maintain a clear airway and adequate ventilation will improve Outcome: Progressing   Problem: Education: Goal: Understanding of cardiac disease, CV risk reduction, and recovery process will improve Outcome: Progressing Goal: Understanding of medication regimen will improve Outcome: Progressing Goal: Individualized Educational Video(s) Outcome: Progressing   Problem: Activity: Goal: Ability to tolerate increased activity will improve Outcome: Progressing   Problem: Cardiac: Goal: Ability to achieve and maintain adequate cardiopulmonary perfusion will improve Outcome: Progressing Goal: Vascular access site(s) Level 0-1 will be maintained Outcome: Progressing   Problem: Health Behavior/Discharge Planning: Goal: Ability to safely manage health-related needs after discharge will improve Outcome: Progressing

## 2019-04-10 NOTE — Progress Notes (Addendum)
Advanced Heart Failure Rounding Note  PCP-Cardiologist: No primary care provider on file.   Subjective:    Extubated 8/14. Had torsades 8/15 requiring shock and re-intubation. Extubated 8/16.   Remains on milrinone 0.125 mcg. CO-OX 61%.   Denies SOB. Feeling better. Sitting in chair. More oriented   Objective:   Weight Range: 85.2 kg Body mass index is 27.73 kg/m.   Vital Signs:   Temp:  [98.1 F (36.7 C)-99.6 F (37.6 C)] 98.2 F (36.8 C) (08/19 0339) Pulse Rate:  [66-90] 86 (08/19 0800) Resp:  [19-46] 41 (08/19 0800) BP: (104-144)/(57-97) 137/72 (08/19 0700) SpO2:  [90 %-100 %] 97 % (08/19 0800) Arterial Line BP: (110-124)/(56-63) 116/58 (08/18 1200) Weight:  [85.2 kg] 85.2 kg (08/19 0431) Last BM Date: 04/07/19  Weight change: Filed Weights   04/08/19 0405 04/09/19 0500 04/10/19 0431  Weight: 86.8 kg 85.7 kg 85.2 kg    Intake/Output:   Intake/Output Summary (Last 24 hours) at 04/10/2019 0806 Last data filed at 04/10/2019 0700 Gross per 24 hour  Intake 587.47 ml  Output 725 ml  Net -137.53 ml      Physical Exam   General:   No resp difficulty. Sitting in the chair.  HEENT: normal Neck: supple. JVP 6-7 . Carotids 2+ bilat; no bruits. No lymphadenopathy or thryomegaly appreciated. Cor: PMI nondisplaced. Regular rate & rhythm. No rubs, gallops or murmurs. Lungs: Decreased  Abdomen: soft, nontender, nondistended. No hepatosplenomegaly. No bruits or masses. Good bowel sounds. Extremities: no cyanosis, clubbing, rash, edema Neuro: alert & orientedx3, cranial nerves grossly intact. moves all 4 extremities w/o difficulty. Affect pleasant   Telemetry  NSR 80s  Personally reviewed   EKG    N/a   Labs    CBC Recent Labs    04/09/19 0615 04/09/19 1219 04/10/19 0018  WBC 8.5 8.8 9.1  NEUTROABS 6.4  --   --   HGB 9.6* 9.8* 9.5*  HCT 29.9* 30.8* 30.3*  MCV 94.6 95.4 95.6  PLT 446* 488* 983*   Basic Metabolic Panel Recent Labs    04/09/19  0423 04/10/19 0515  NA 141 142  K 3.6 3.7  CL 109 110  CO2 23 24  GLUCOSE 98 99  BUN 24* 24*  CREATININE 0.82 0.82  CALCIUM 8.3* 8.5*  MG 2.0 1.9   Liver Function Tests No results for input(s): AST, ALT, ALKPHOS, BILITOT, PROT, ALBUMIN in the last 72 hours. No results for input(s): LIPASE, AMYLASE in the last 72 hours. Cardiac Enzymes No results for input(s): CKTOTAL, CKMB, CKMBINDEX, TROPONINI in the last 72 hours.  BNP: BNP (last 3 results) Recent Labs    03/31/19 0013  BNP 1,247.4*    ProBNP (last 3 results) No results for input(s): PROBNP in the last 8760 hours.   D-Dimer No results for input(s): DDIMER in the last 72 hours. Hemoglobin A1C No results for input(s): HGBA1C in the last 72 hours. Fasting Lipid Panel Recent Labs    04/09/19 0423  TRIG 88   Thyroid Function Tests No results for input(s): TSH, T4TOTAL, T3FREE, THYROIDAB in the last 72 hours.  Invalid input(s): FREET3  Other results:   Imaging    Dg Swallowing Func-speech Pathology  Result Date: 04/09/2019 Objective Swallowing Evaluation: Type of Study: MBS-Modified Barium Swallow Study  Patient Details Name: Stephen Jennings MRN: [id number] Date of Birth: [birth year] Today's Date: 04/09/2019 Time: SLP Start Time (ACUTE ONLY): 6734 -SLP Stop Time (ACUTE ONLY): 1331 SLP Time Calculation (min) (ACUTE ONLY): 19 min Past  Medical History: Past Medical History: Diagnosis Date . Medical history non-contributory  Past Surgical History: Past Surgical History: Procedure Laterality Date . CORONARY/GRAFT ACUTE MI REVASCULARIZATION N/A 03/31/2019  Procedure: Coronary/Graft Acute MI Revascularization;  Surgeon: Troy Sine, MD;  Location: [facility];  Service: Cardiovascular;  Laterality: N/A; . LEFT HEART CATH AND CORONARY ANGIOGRAPHY N/A 03/31/2019  Procedure: LEFT HEART CATH AND CORONARY ANGIOGRAPHY;  Surgeon: Troy Sine, MD;  Location: [facility];  Service: Cardiovascular;  Laterality: N/A; . NO  PAST SURGERIES   . RIGHT HEART CATH N/A 03/31/2019  Procedure: RIGHT HEART CATH;  Surgeon: Troy Sine, MD;  Location: [facility];  Service: Cardiovascular;  Laterality: N/A; . VENTRICULAR ASSIST DEVICE INSERTION N/A 03/31/2019  Procedure: VENTRICULAR ASSIST DEVICE INSERTION;  Surgeon: Troy Sine, MD;  Location: [facility];  Service: Cardiovascular;  Laterality: N/A; HPI: 3M with no known PMHx who presented with respiratory distress via EMS, intubated in the ED 8/9.  Code STEMI was called and patient was taken to the cath lab with complete occlusion of the LAD with cardiogenic shock. Pt was extubated 8/14 but was reintubated on 8/15 when he suffered a cardiac arrest requiring defibrillation x2, 2 rounds of epi approximately 10 minutes CPR with ROSC. He was extubated 8/16. Diet had been initiated, but RN noticed coughing with PO intake.  Subjective: pt says it's "hard" to swallow, referring to his energy levels Assessment / Plan / Recommendation CHL IP CLINICAL IMPRESSIONS 04/09/2019 Clinical Impression Pt has a mild oropharyngeal dysphagia characterized by lingual weakness that slows oral transit and reduced base of tongue retraction for epiglottic deflection and full vallecular clearance. Mild-moderate remained in the valleculae post-swallow, often cleared spontaneously by a second swallow. He is additionally impacted by his limited dentition, resulting prolonged and incomplete oral clearance of soft solids. Pt initially demonstrated adequate airway protection even with thin liquids, but as the study progressed his work and rate of breathing began to increase with fatigue, and he started to have deep laryngeal penetration with thin liquids. He was not observed to aspirate, but also could not clear penetrates despite cues for a volitional cough, and liquids were consistently entering the laryngeal vestibule on each trial. He continued to have penetration with large, consecutive boluses of nectar  thick liquids via straw, although not quite reaching the vocal folds. When the straw was removed, SLP could better regulate his pacing and bolus size and he subsequently had better airway protection. Recommend starting with a full liquid diet thickened to nectar thick liquids to increase airway protection and conserve energy. Would provide full supervision to assist with small, single cup sips and to take frequent rest breaks to accomodate his current respiratory status. SLP will continue to follow for potential to advance pending improved overall endurance. SLP Visit Diagnosis Dysphagia, oropharyngeal phase (R13.12) Attention and concentration deficit following -- Frontal lobe and executive function deficit following -- Impact on safety and function Mild aspiration risk;Moderate aspiration risk   CHL IP TREATMENT RECOMMENDATION 04/09/2019 Treatment Recommendations Therapy as outlined in treatment plan below   Prognosis 04/09/2019 Prognosis for Safe Diet Advancement Good Barriers to Reach Goals Cognitive deficits Barriers/Prognosis Comment -- CHL IP DIET RECOMMENDATION 04/09/2019 SLP Diet Recommendations Nectar thick liquid Liquid Administration via Cup;Spoon;No straw Medication Administration Whole meds with puree Compensations Slow rate;Small sips/bites;Clear throat intermittently Postural Changes Seated upright at 90 degrees;Remain semi-upright after after feeds/meals (Comment)   CHL IP OTHER RECOMMENDATIONS 04/09/2019 Recommended Consults --  Oral Care Recommendations Oral care BID Other Recommendations Order thickener from pharmacy;Prohibited food (jello, ice cream, thin soups);Remove water pitcher   CHL IP FOLLOW UP RECOMMENDATIONS 04/09/2019 Follow up Recommendations (No Data)   CHL IP FREQUENCY AND DURATION 04/09/2019 Speech Therapy Frequency (ACUTE ONLY) min 2x/week Treatment Duration 2 weeks      CHL IP ORAL PHASE 04/09/2019 Oral Phase Impaired Oral - Pudding Teaspoon -- Oral - Pudding Cup -- Oral - Honey Teaspoon  -- Oral - Honey Cup -- Oral - Nectar Teaspoon -- Oral - Nectar Cup Decreased bolus cohesion;Weak lingual manipulation Oral - Nectar Straw Decreased bolus cohesion;Weak lingual manipulation Oral - Thin Teaspoon -- Oral - Thin Cup -- Oral - Thin Straw Decreased bolus cohesion;Weak lingual manipulation Oral - Puree Decreased bolus cohesion;Weak lingual manipulation Oral - Mech Soft Decreased bolus cohesion;Weak lingual manipulation;Impaired mastication;Lingual/palatal residue Oral - Regular -- Oral - Multi-Consistency -- Oral - Pill Weak lingual manipulation Oral Phase - Comment --  CHL IP PHARYNGEAL PHASE 04/09/2019 Pharyngeal Phase Impaired Pharyngeal- Pudding Teaspoon -- Pharyngeal -- Pharyngeal- Pudding Cup -- Pharyngeal -- Pharyngeal- Honey Teaspoon -- Pharyngeal -- Pharyngeal- Honey Cup -- Pharyngeal -- Pharyngeal- Nectar Teaspoon -- Pharyngeal -- Pharyngeal- Nectar Cup Reduced tongue base retraction;Reduced epiglottic inversion;Pharyngeal residue - valleculae;Penetration/Aspiration before swallow Pharyngeal Material enters airway, remains ABOVE vocal cords then ejected out Pharyngeal- Nectar Straw Reduced tongue base retraction;Reduced epiglottic inversion;Pharyngeal residue - valleculae;Penetration/Aspiration before swallow Pharyngeal Material enters airway, remains ABOVE vocal cords and not ejected out Pharyngeal- Thin Teaspoon -- Pharyngeal -- Pharyngeal- Thin Cup -- Pharyngeal -- Pharyngeal- Thin Straw Reduced tongue base retraction;Reduced epiglottic inversion;Pharyngeal residue - valleculae;Penetration/Aspiration before swallow Pharyngeal Material enters airway, CONTACTS cords and not ejected out Pharyngeal- Puree Reduced tongue base retraction;Reduced epiglottic inversion;Pharyngeal residue - valleculae Pharyngeal -- Pharyngeal- Mechanical Soft Reduced tongue base retraction;Reduced epiglottic inversion;Pharyngeal residue - valleculae Pharyngeal -- Pharyngeal- Regular -- Pharyngeal -- Pharyngeal-  Multi-consistency -- Pharyngeal -- Pharyngeal- Pill Reduced tongue base retraction;Reduced epiglottic inversion;Pharyngeal residue - pyriform Pharyngeal -- Pharyngeal Comment --  CHL IP CERVICAL ESOPHAGEAL PHASE 04/09/2019 Cervical Esophageal Phase Impaired Pudding Teaspoon -- Pudding Cup -- Honey Teaspoon -- Honey Cup -- Nectar Teaspoon -- Nectar Cup Reduced cricopharyngeal relaxation Nectar Straw Reduced cricopharyngeal relaxation Thin Teaspoon -- Thin Cup -- Thin Straw Reduced cricopharyngeal relaxation Puree Reduced cricopharyngeal relaxation Mechanical Soft Reduced cricopharyngeal relaxation Regular -- Multi-consistency -- Pill Reduced cricopharyngeal relaxation Cervical Esophageal Comment -- Venita Sheffield Nix 04/09/2019, 1:59 PM  [name], M.A. CCC-SLP Acute Rehabilitation Services Pager [phone] Office [phone]               Medications:     Scheduled Medications: . sodium chloride   Intravenous Once  . amiodarone  200 mg Oral BID  . aspirin  81 mg Oral Daily  . atorvastatin  80 mg Oral q1800  . chlorhexidine  15 mL Mouth Rinse BID  . Chlorhexidine Gluconate Cloth  6 each Topical Daily  . digoxin  0.125 mg Oral Daily  . feeding supplement (ENSURE ENLIVE)  237 mL Oral BID BM  . heparin injection (subcutaneous)  5,000 Units Subcutaneous Q8H  . insulin aspart  0-9 Units Subcutaneous Q4H  . insulin aspart  3 Units Subcutaneous Q4H  . insulin glargine  15 Units Subcutaneous Daily  . ipratropium-albuterol  3 mL Nebulization TID  . losartan  12.5 mg Oral Daily  . mouth rinse  15 mL Mouth Rinse q12n4p  . pantoprazole (PROTONIX) IV  40 mg Intravenous Daily  . sodium chloride  flush  10-40 mL Intracatheter Q12H  . sorbitol  30 mL Per Tube Once  . spironolactone  25 mg Oral Daily  . Thrombi-Pad  1 each Topical Once  . ticagrelor  90 mg Oral BID    Infusions: . sodium chloride Stopped (04/06/19 0930)  . milrinone 0.125 mcg/kg/min (04/10/19 0700)    PRN Medications: acetaminophen,  ipratropium-albuterol, nitroGLYCERIN, ondansetron (ZOFRAN) IV, Resource ThickenUp Clear, sodium chloride flush   *  Assessment/Plan    1. Cardiac arrest/VT/torsades on 8/15 - rhythm stable on IV amio - Keep K> 4.0 Mg > 2.0 - if recovers will need Lifevest vs ICD prior to ICD  2. Acute systolic HF -> Cardiogenic shock due to acute anterior MI - s/p PCI/DES to LAD on 8/9 - Impella removed 8/12 - Stop  Milrinone.  CO-OX 61%.  - Continue digoxin 0.125  -  Continue spiro to 25 mg daily.   - Stop losartan. Start entresto 24-26 mg twice a day.   - No BB for now.  -Renal function stable  - Echo 20-25%  3. CAD with acute anterior MI 8/9 - s/p PCI/DES to LAD. 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 Acute hypoxic respiratory failure - in setting of #1 - extubated 8/14. Reintubated 8/15 in setting of torsades. Extubated 8/16.  -respiratory status improving.    4. DM2 - new diagnosis - cover with SSI  5. COPD - ongoing tobacco use - needs cessation   6. NSVT/Torsades - Improved on amio drip. Has had > 10 gram load amio. Continue amio 200 mg twice a day.   - Keep K> 4.0 Mg > 2.0 - Supp K .  - Give 2 grams mag - Consult EP for ICD.     7. Anemia, acute blood loss - Got 1u RBC on 8/15.  - Hgb stable today 9.5   8. Possible ileus.  - KUB 8/16 no ileus   PT recommending HHPT. Speech consult appreciated. Able to take nectar thick liquids.    Transfer to [facility]. Consult care management for [facility].     Length of Stay: [place], NP  04/10/2019, 8:06 AM  Advanced Heart Failure Team Pager [phone] (M-F; 7a - 4p)  Please contact [facility] for night-coverage after hours (4p -7a ) and weekends on [email or web address]  Patient seen and examined with the above-signed Advanced Practice Provider and/or Housestaff. I personally reviewed laboratory data, imaging studies and relevant notes. I  independently examined the patient and formulated the important aspects of the plan. I have edited the note to reflect any of my changes or salient points. I have personally discussed the plan with the patient and/or family.  He is sitting in the chair. More oriented. No CP or SOB. Volume status looks good. Co-ox ok on low dose milrinone. Will stop.   Rhythm stable on po amio. No further VT or torsades. Will ask EP to see regarding Lifevest vs ICD. Supp K. Start Entresto. Hopefully can start b-blocker soon.  Move to SDU.   [name], MD  8:25 AM

## 2019-04-10 NOTE — Progress Notes (Signed)
Physical Therapy Treatment Patient Details Name: Stephen Jennings MRN: [id number] DOB: [birth year] Today's Date: 04/10/2019    History of Present Illness 13M with no known PMHx who presented with respiratory distress via EMS, intubated in the ED 8/9.  Code STEMI was called and patient was taken to the cath lab with complete occlusion of the LAD with cardiogenic shock. Pt was extubated 8/14 but was reintubated on 8/15 when he suffered a cardiac arrest requiring defibrillation x2, 2 rounds of epi approximately 10 minutes CPR with ROSC. He was extubated 8/16.    PT Comments    Patient progressing to hallway ambulation this session.  Balance and gait deficits remain with mod overall for ambulation with RW and need for redirection with veering with head turns.  Patient with decreased safety awareness and decreased deficit awareness.  Still plan for home with wife assist and follow up HHPT.    Follow Up Recommendations  Home health PT;Supervision/Assistance - 24 hour     Equipment Recommendations  Rolling walker with 5" wheels    Recommendations for Other Services       Precautions / Restrictions Precautions Precautions: Fall Restrictions Weight Bearing Restrictions: No    Mobility  Bed Mobility               General bed mobility comments: up in recliner  Transfers Overall transfer level: Needs assistance Equipment used: Rolling walker (2 wheeled) Transfers: Sit to/from Stand Sit to Stand: Min assist         General transfer comment: assist for balance  Ambulation/Gait Ambulation/Gait assistance: Mod assist Gait Distance (Feet): 150 Feet Assistive device: Rolling walker (2 wheeled) Gait Pattern/deviations: Step-through pattern;Decreased stride length;Drifts right/left     General Gait Details: veers to side with head turns, caught on door facing on R then on housekeeping cart on R, LOB in room trying to walk around computer mod to max A to recover   Stairs              Wheelchair Mobility    Modified Rankin (Stroke Patients Only)       Balance Overall balance assessment: Needs assistance Sitting-balance support: Feet supported Sitting balance-Leahy Scale: Fair     Standing balance support: Bilateral upper extremity supported Standing balance-Leahy Scale: Poor Standing balance comment: UE support for balance                            Cognition Arousal/Alertness: Awake/alert Behavior During Therapy: WFL for tasks assessed/performed Overall Cognitive Status: Impaired/Different from baseline Area of Impairment: Safety/judgement;Attention;Problem solving                   Current Attention Level: Sustained     Safety/Judgement: Decreased awareness of safety;Decreased awareness of deficits   Problem Solving: Slow processing;Requires verbal cues        Exercises      General Comments General comments (skin integrity, edema, etc.): SpO2 on Ra down to 83% ambulating on RA, back up to 90% in 30 seconds in room seated rest still on RA, then 95% after resting      Pertinent Vitals/Pain Faces Pain Scale: No hurt    Home Living                      Prior Function            PT Goals (current goals can now be found in the care plan section) Progress towards  PT goals: Progressing toward goals    Frequency    Min 3X/week      PT Plan Current plan remains appropriate    Co-evaluation              AM-PAC PT "6 Clicks" Mobility   Outcome Measure  Help needed turning from your back to your side while in a flat bed without using bedrails?: A Little Help needed moving from lying on your back to sitting on the side of a flat bed without using bedrails?: A Little Help needed moving to and from a bed to a chair (including a wheelchair)?: A Little Help needed standing up from a chair using your arms (e.g., wheelchair or bedside chair)?: A Little Help needed to walk in hospital room?: A  Little Help needed climbing 3-5 steps with a railing? : A Lot 6 Click Score: 17    End of Session   Activity Tolerance: Patient tolerated treatment well Patient left: with call bell/phone within reach;in chair   PT Visit Diagnosis: Other abnormalities of gait and mobility (R26.89);Muscle weakness (generalized) (M62.81)     Time: 6438-3818 PT Time Calculation (min) (ACUTE ONLY): 16 min  Charges:  $Gait Training: 8-22 mins                     Magda Kiel, [place] [phone] 04/10/2019    Reginia Naas 04/10/2019, 10:45 AM

## 2019-04-11 DIAGNOSIS — R4182 Altered mental status, unspecified: Secondary | ICD-10-CM

## 2019-04-11 LAB — BASIC METABOLIC PANEL
Anion gap: 9 (ref 5–15)
BUN: 22 mg/dL (ref 8–23)
CO2: 24 mmol/L (ref 22–32)
Calcium: 8.7 mg/dL — ABNORMAL LOW (ref 8.9–10.3)
Chloride: 109 mmol/L (ref 98–111)
Creatinine, Ser: 0.78 mg/dL (ref 0.61–1.24)
GFR calc Af Amer: >60 mL/min (ref >60)
GFR calc non Af Amer: >60 mL/min (ref >60)
Glucose, Bld: 109 mg/dL — ABNORMAL HIGH (ref 70–99)
Potassium: 3.7 mmol/L (ref 3.5–5.1)
Sodium: 142 mmol/L (ref 135–145)

## 2019-04-11 LAB — GLUCOSE, CAPILLARY
Glucose-Capillary: 104 mg/dL — ABNORMAL HIGH (ref 70–99)
Glucose-Capillary: 109 mg/dL — ABNORMAL HIGH (ref 70–99)
Glucose-Capillary: 109 mg/dL — ABNORMAL HIGH (ref 70–99)
Glucose-Capillary: 119 mg/dL — ABNORMAL HIGH (ref 70–99)
Glucose-Capillary: 122 mg/dL — ABNORMAL HIGH (ref 70–99)
Glucose-Capillary: 72 mg/dL (ref 70–99)
Glucose-Capillary: 83 mg/dL (ref 70–99)

## 2019-04-11 LAB — CBC
HCT: 30.6 % — ABNORMAL LOW (ref 39.0–52.0)
Hemoglobin: 9.8 g/dL — ABNORMAL LOW (ref 13.0–17.0)
MCH: 30.2 pg (ref 26.0–34.0)
MCHC: 32 g/dL (ref 30.0–36.0)
MCV: 94.4 fL (ref 80.0–100.0)
Platelets: 621 10*3/uL — ABNORMAL HIGH (ref 150–400)
RBC: 3.24 MIL/uL — ABNORMAL LOW (ref 4.22–5.81)
RDW: 14.6 % (ref 11.5–15.5)
WBC: 10.2 10*3/uL (ref 4.0–10.5)
nRBC: 0 % (ref 0.0–0.2)

## 2019-04-11 LAB — COOXEMETRY PANEL
Carboxyhemoglobin: 1.9 % — ABNORMAL HIGH (ref 0.5–1.5)
Methemoglobin: 0.5 % (ref 0.0–1.5)
O2 Saturation: 58.4 %
Total hemoglobin: 8.8 g/dL — ABNORMAL LOW (ref 12.0–16.0)

## 2019-04-11 LAB — LACTATE DEHYDROGENASE: LDH: 262 U/L — ABNORMAL HIGH (ref 98–192)

## 2019-04-11 LAB — MAGNESIUM: Magnesium: 2 mg/dL (ref 1.7–2.4)

## 2019-04-11 MED ORDER — LORAZEPAM 2 MG/ML IJ SOLN
0.5000 mg | Freq: Once | INTRAMUSCULAR | Status: AC
  Administered 2019-04-11: 04:00:00 via INTRAVENOUS
  Filled 2019-04-11: qty 1

## 2019-04-11 MED ORDER — IPRATROPIUM-ALBUTEROL 0.5-2.5 (3) MG/3ML IN SOLN: 3.0000 mL | Freq: Four times a day (QID) | RESPIRATORY_TRACT | Status: DC | PRN

## 2019-04-11 MED ORDER — POTASSIUM CHLORIDE CRYS ER 20 MEQ PO TBCR
40.0000 meq | EXTENDED_RELEASE_TABLET | Freq: Once | ORAL | Status: AC
  Administered 2019-04-11: 18:00:00 40 meq via ORAL
  Filled 2019-04-11: qty 2

## 2019-04-11 MED ORDER — LORAZEPAM 2 MG/ML IJ SOLN
INTRAMUSCULAR | Status: AC
  Administered 2019-04-11: 04:00:00 via INTRAVENOUS
  Filled 2019-04-11: qty 1

## 2019-04-11 MED ORDER — CLONAZEPAM 0.125 MG PO TBDP
0.1250 mg | ORAL_TABLET | Freq: Two times a day (BID) | ORAL | Status: DC | PRN
  Administered 2019-04-11 – 2019-04-14 (×5): 0.125 mg via ORAL
  Filled 2019-04-11 (×5): qty 1

## 2019-04-11 MED ORDER — QUETIAPINE FUMARATE 50 MG PO TABS
25.0000 mg | ORAL_TABLET | Freq: Two times a day (BID) | ORAL | Status: DC
  Administered 2019-04-11 – 2019-04-15 (×8): 25 mg via ORAL
  Filled 2019-04-11 (×9): qty 1

## 2019-04-11 NOTE — Progress Notes (Signed)
Alarm sounding in patient room.  2 nurse techs attempting to restrain patient on the bed.  Patient swinging arms at nurse techs and using foul language, banging head on the side rail.  Soft restraints applied to bilateral wrists and ankles to protect the patient and the staff.  Once restrained, the night shift nurse tech reported to writer that the patient had be extremely agitated during the night and had become worse in the last hour.  Reports patient has been taking nonsense all night, making threats toward sitter, getting up and down out of the bed, and falling on 3 separate occasions.  Five staff members were required to arange patient in the bed properly

## 2019-04-11 NOTE — Progress Notes (Signed)
CARDIAC REHAB PHASE I   Went to ambulate with pt, pt currently in restraints. Pt verbally abusive to sitter and attempts to swing his arms at sitter. Tried to calm pt down and help pt relax in bed. Pt continues to be aggressive and non-redirectable. Pt offered food and drink, states "I'd rather starve". Will f/u as appropriate.     Rufina Falco, RN BSN 04/11/2019 1:46 PM

## 2019-04-11 NOTE — Progress Notes (Signed)
Over the past hour, patient became more confused than earlier during the shift and decided to leave the hospital. Wife was called multiple times, but unfortunately was not able to redirect or convince patient to stay. Cardiologist notified and 1mg  Ativan IV was ordered and given without any significant change. 1 on 1 supervision was started with NT and RN rotating in patient's room to keep patient safe and calm for the rest of the shift.

## 2019-04-11 NOTE — Progress Notes (Signed)
Nurse was called by unit secretary about patient being on the floor. Upon entering the room, patient was already back in bed, stating he felt on his buttock but didn't get injured. Patient stated he was going to the bathroom. Of note, patient had a condom catheter that fell off. VSS and neuro assessment unchanged. No physical injury noted by nurse. Physician notified. A new condom cath put on, bed alarm on, and patient reeducated about the need to use the call bed before getting out of bed.

## 2019-04-11 NOTE — Progress Notes (Addendum)
Advanced Heart Failure Rounding Note  PCP-Cardiologist: No primary care provider on file.   Subjective:    Extubated 8/14. Had torsades 8/15 requiring shock and re-intubation. Extubated 8/16. Milrinone stopped 8/19. Co-OX 58%.   ? Fall. Overnight he was agitated and combative. Sitter at bedside and placed in restraints.  Denies chest pain. Denies SOB. Oriented to self and year.     Objective:   Weight Range: 83.3 kg Body mass index is 27.11 kg/m.   Vital Signs:   Temp:  [98 F (36.7 C)-99.4 F (37.4 C)] 98 F (36.7 C) (08/20 0400) Pulse Rate:  [61-86] 76 (08/20 0400) Resp:  [19-41] 20 (08/20 0400) BP: (117-149)/(66-83) 122/83 (08/20 0400) SpO2:  [90 %-98 %] 98 % (08/20 0400) Weight:  [83.3 kg] 83.3 kg (08/20 0400) Last BM Date: 04/10/19  Weight change: Filed Weights   04/09/19 0500 04/10/19 0431 04/11/19 0400  Weight: 85.7 kg 85.2 kg 83.3 kg    Intake/Output:   Intake/Output Summary (Last 24 hours) at 04/11/2019 0756 Last data filed at 04/11/2019 0642 Gross per 24 hour  Intake 973.5 ml  Output 475 ml  Net 498.5 ml      Physical Exam   General:  No resp difficulty. Sitter at bedside.  HEENT: normal Neck: supple. JVD 5-6 . Carotids 2+ bilat; no bruits. No lymphadenopathy or thryomegaly appreciated. LIJ  Cor: PMI nondisplaced. Regular rate & rhythm. No rubs, gallops or murmurs. Lungs: Decreased Abdomen: soft, nontender, nondistended. No hepatosplenomegaly. No bruits or masses. Good bowel sounds. Extremities: no cyanosis, clubbing, rash, edema Neuro: alert & oriented x2 to self/year. Cranial nerves grossly intact. moves all 4 extremities w/o difficulty. Affect flat    Telemetry  NSr 70-80s personally reviewed.    EKG    N/a   Labs    CBC Recent Labs    04/09/19 0615  04/10/19 1325 04/11/19 0108  WBC 8.5   < > 11.5* 10.2  NEUTROABS 6.4  --   --   --   HGB 9.6*   < > 9.5* 9.8*  HCT 29.9*   < > 29.9* 30.6*  MCV 94.6   < > 94.0 94.4  PLT  446*   < > 606* 621*   < > = values in this interval not displayed.   Basic Metabolic Panel Recent Labs    04/10/19 0515 04/11/19 0108  NA 142 142  K 3.7 3.7  CL 110 109  CO2 24 24  GLUCOSE 99 109*  BUN 24* 22  CREATININE 0.82 0.78  CALCIUM 8.5* 8.7*  MG 1.9 2.0   Liver Function Tests No results for input(s): AST, ALT, ALKPHOS, BILITOT, PROT, ALBUMIN in the last 72 hours. No results for input(s): LIPASE, AMYLASE in the last 72 hours. Cardiac Enzymes No results for input(s): CKTOTAL, CKMB, CKMBINDEX, TROPONINI in the last 72 hours.  BNP: BNP (last 3 results) Recent Labs    03/31/19 0013  BNP 1,247.4*    ProBNP (last 3 results) No results for input(s): PROBNP in the last 8760 hours.   D-Dimer No results for input(s): DDIMER in the last 72 hours. Hemoglobin A1C No results for input(s): HGBA1C in the last 72 hours. Fasting Lipid Panel Recent Labs    04/09/19 0423  TRIG 88   Thyroid Function Tests No results for input(s): TSH, T4TOTAL, T3FREE, THYROIDAB in the last 72 hours.  Invalid input(s): FREET3  Other results:   Imaging    No results found.   Medications:  Scheduled Medications: . sodium chloride   Intravenous Once  . amiodarone  200 mg Oral BID  . aspirin  81 mg Oral Daily  . atorvastatin  80 mg Oral q1800  . chlorhexidine  15 mL Mouth Rinse BID  . Chlorhexidine Gluconate Cloth  6 each Topical Daily  . digoxin  0.125 mg Oral Daily  . feeding supplement (ENSURE ENLIVE)  237 mL Oral BID BM  . heparin injection (subcutaneous)  5,000 Units Subcutaneous Q8H  . insulin aspart  0-9 Units Subcutaneous Q4H  . insulin aspart  3 Units Subcutaneous Q4H  . insulin glargine  15 Units Subcutaneous Daily  . ipratropium-albuterol  3 mL Nebulization TID  . mouth rinse  15 mL Mouth Rinse q12n4p  . pantoprazole (PROTONIX) IV  40 mg Intravenous Daily  . sacubitril-valsartan  1 tablet Oral BID  . sodium chloride flush  10-40 mL Intracatheter Q12H  .  sorbitol  30 mL Per Tube Once  . spironolactone  25 mg Oral Daily  . Thrombi-Pad  1 each Topical Once  . ticagrelor  90 mg Oral BID    Infusions: . sodium chloride Stopped (04/06/19 0930)    PRN Medications: acetaminophen, ipratropium-albuterol, nitroGLYCERIN, ondansetron (ZOFRAN) IV, Resource ThickenUp Clear, sodium chloride flush   *  Assessment/Plan    1. Cardiac arrest/VT/torsades on 8/15 - rhythm stable on IV amio - Keep K> 4.0 Mg > 2.0 -EP following for ICD.   2. Acute systolic HF -> Cardiogenic shock due to acute anterior MI - s/p PCI/DES to LAD on 8/9 - Impella removed 8/12 - Stable off Milrinone.  CO-OX 58%.  - Renal function stable. Volume status stable.  - Continue digoxin 0.125  -  Continue spiro to 25 mg daily.   - Continue entresto 24-26 mg twice a day.   - No BB for now.  - Echo 20-25%  3. CAD with acute anterior MI 8/9 - s/p PCI/DES to LAD. 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 Acute hypoxic respiratory failure - in setting of #1 - extubated 8/14. Reintubated 8/15 in setting of torsades. Extubated 8/16.  -On room air.    4. DM2 - new diagnosis - cover with SSI  5. COPD - ongoing tobacco use - needs cessation   6. NSVT/Torsades - Improved on amio drip. Has had > 10 gram load amio. Continue amio 200 mg twice a day.   - Keep K> 4.0 Mg > 2.0 - Supp K .  - EP appreciated. Plan for ICD this admit for secondary prevention.    - Hold ICD today.   7. Anemia, acute blood loss - Got 1u RBC on 8/15.  - Hgb stable today 9.8   8. Possible ileus.  - KUB 8/16 no ileus   9. Delirium  Add clonopin.    PT/OT following. Recommending HH--> ordered.  Considered CIR but no insurance.     Length of Stay: [place], NP  04/11/2019, 7:56 AM  Advanced Heart Failure Team Pager [phone] (M-F; [place])  Please contact [facility] for  night-coverage after hours (4p -7a ) and weekends on [email or web address]  Patient seen and examined with the above-signed Advanced Practice Provider and/or Housestaff. I personally reviewed laboratory data, imaging studies and relevant notes. I independently examined the patient and formulated the important aspects of the plan. I have edited the note to reflect any of my changes or  salient points. I have personally discussed the plan with the patient and/or family.  HF and rhythm stable. Co-ox 58%. Volume status ok. Confused and agitated overnight and this am. I sat and tried to re-orient him this am with inly moderate success. D/w EP. Will hold off on ICD today. Possibly Monday. Watch rhythm closely on monitor. Consider Seroquel. Discussed dosing with PharmD personally.  [name], MD  9:39 AM

## 2019-04-11 NOTE — Progress Notes (Signed)
Patient refuses to allow RN to draw blood from Left IJ for CBC.  Numerous attempts have been made

## 2019-04-11 NOTE — Progress Notes (Addendum)
Patient with AMS, became agitated, combative over night/this AM.  He is in restraints this AM Dr. Haroldine Laws at bedside, inappropriate with some of his answers, appropriate with others.  Telemetry SR, VSS  Will hold off device implant until mental status improves Discussed with Dr. Haroldine Laws Will make Dr. Lovena Le aware  D/w RN, OK to resume diet from our standpoint this AM  Tommye Standard, [name]  EP Attending  Patient seen and examined. Agree with the findings as noted above. The patient has developed altered mentation today and will hopefully improve over the coming days. He has a secondary indication for ICD insertion for prevention of sudden death. We await his return to more normal neuro function. His ICD will be inserted once his encephalopathy resolves.   Mikle Bosworth.D.

## 2019-04-12 LAB — CBC
HCT: 30.9 % — ABNORMAL LOW (ref 39.0–52.0)
Hemoglobin: 9.7 g/dL — ABNORMAL LOW (ref 13.0–17.0)
MCH: 29.9 pg (ref 26.0–34.0)
MCHC: 31.4 g/dL (ref 30.0–36.0)
MCV: 95.4 fL (ref 80.0–100.0)
Platelets: 662 10*3/uL — ABNORMAL HIGH (ref 150–400)
RBC: 3.24 MIL/uL — ABNORMAL LOW (ref 4.22–5.81)
RDW: 14.6 % (ref 11.5–15.5)
WBC: 10 10*3/uL (ref 4.0–10.5)
nRBC: 0 % (ref 0.0–0.2)

## 2019-04-12 LAB — GLUCOSE, CAPILLARY
Glucose-Capillary: 101 mg/dL — ABNORMAL HIGH (ref 70–99)
Glucose-Capillary: 116 mg/dL — ABNORMAL HIGH (ref 70–99)
Glucose-Capillary: 44 mg/dL — CL (ref 70–99)
Glucose-Capillary: 67 mg/dL — ABNORMAL LOW (ref 70–99)
Glucose-Capillary: 75 mg/dL (ref 70–99)
Glucose-Capillary: 85 mg/dL (ref 70–99)
Glucose-Capillary: 95 mg/dL (ref 70–99)

## 2019-04-12 LAB — COOXEMETRY PANEL
Carboxyhemoglobin: 1.6 % — ABNORMAL HIGH (ref 0.5–1.5)
Methemoglobin: 0.6 % (ref 0.0–1.5)
O2 Saturation: 55.3 %
Total hemoglobin: 9.7 g/dL — ABNORMAL LOW (ref 12.0–16.0)

## 2019-04-12 LAB — BASIC METABOLIC PANEL
Anion gap: 9 (ref 5–15)
BUN: 17 mg/dL (ref 8–23)
CO2: 22 mmol/L (ref 22–32)
Calcium: 8.6 mg/dL — ABNORMAL LOW (ref 8.9–10.3)
Chloride: 110 mmol/L (ref 98–111)
Creatinine, Ser: 0.82 mg/dL (ref 0.61–1.24)
GFR calc Af Amer: >60 mL/min (ref >60)
GFR calc non Af Amer: >60 mL/min (ref >60)
Glucose, Bld: 83 mg/dL (ref 70–99)
Potassium: 3.7 mmol/L (ref 3.5–5.1)
Sodium: 141 mmol/L (ref 135–145)

## 2019-04-12 LAB — MAGNESIUM: Magnesium: 1.9 mg/dL (ref 1.7–2.4)

## 2019-04-12 LAB — LACTATE DEHYDROGENASE: LDH: 262 U/L — ABNORMAL HIGH (ref 98–192)

## 2019-04-12 LAB — TRIGLYCERIDES: Triglycerides: 99 mg/dL (ref <150)

## 2019-04-12 MED ORDER — PANTOPRAZOLE SODIUM 40 MG PO TBEC
40.0000 mg | DELAYED_RELEASE_TABLET | Freq: Every day | ORAL | Status: DC
  Administered 2019-04-13 – 2019-04-16 (×4): 40 mg via ORAL
  Filled 2019-04-12 (×4): qty 1

## 2019-04-12 MED ORDER — GLUCOSE 40 % PO GEL
ORAL | Status: AC
  Administered 2019-04-12: 12:00:00 37.5 g
  Filled 2019-04-12: qty 1

## 2019-04-12 MED ORDER — CARVEDILOL 3.125 MG PO TABS
3.1250 mg | ORAL_TABLET | Freq: Two times a day (BID) | ORAL | Status: DC
  Administered 2019-04-12 – 2019-04-16 (×8): 3.125 mg via ORAL
  Filled 2019-04-12 (×8): qty 1

## 2019-04-12 MED ORDER — MAGNESIUM SULFATE 2 GM/50ML IV SOLN
2.0000 g | Freq: Once | INTRAVENOUS | Status: AC
  Administered 2019-04-12: 15:00:00 2 g via INTRAVENOUS
  Filled 2019-04-12: qty 50

## 2019-04-12 MED ORDER — QUETIAPINE FUMARATE 50 MG PO TABS
25.0000 mg | ORAL_TABLET | Freq: Once | ORAL | Status: AC
  Administered 2019-04-12: 01:00:00 25 mg via ORAL
  Filled 2019-04-12: qty 1

## 2019-04-12 MED ORDER — POTASSIUM CHLORIDE CRYS ER 20 MEQ PO TBCR
40.0000 meq | EXTENDED_RELEASE_TABLET | Freq: Once | ORAL | Status: AC
  Administered 2019-04-12: 40 meq via ORAL
  Filled 2019-04-12: qty 2

## 2019-04-12 NOTE — Progress Notes (Signed)
Occupational Therapy Treatment Patient Details Name: Stephen Jennings MRN: [id number] DOB: 1956-04-19 Today's Date: 04/12/2019    History of present illness Pt is a 63 y/o M with no known PMHx who presented with respiratory distress via EMS, intubated in the ED 8/9.  Code STEMI was called and patient was taken to the cath lab with complete occlusion of the LAD with cardiogenic shock. Pt was extubated 8/14 but was reintubated on 8/15 when he suffered a cardiac arrest requiring defibrillation x2, 2 rounds of epi approximately 10 minutes CPR with ROSC. He was extubated 8/16.   OT comments  This 63 yo male admitted with above presents to acute OT making progress by doing some activities without AD when up on his feet but needing more A for balance. He will continue to benefit from acute OT with follow up [place] and 24 hour S/prn A anytime he is up on his feet.  Follow Up Recommendations  Home health OT;Supervision/Assistance - 24 hour    Equipment Recommendations  3 in 1 bedside commode       Precautions / Restrictions Precautions Precautions: Fall Restrictions Weight Bearing Restrictions: No       Mobility Bed Mobility Overal bed mobility: Needs Assistance Bed Mobility: Supine to Sit;Sit to Supine     Supine to sit: Supervision Sit to supine: Supervision      Transfers Overall transfer level: Needs assistance Equipment used: Rolling walker (2 wheeled);None Transfers: Sit to/from Stand Sit to Stand: Min guard         General transfer comment: cues for hand placement. Started out with ambulation without AD (20 feet) then patient pointed to RW that I was carrying and wanted it (completed ambulation at RW level)    Balance Overall balance assessment: Needs assistance Sitting-balance support: No upper extremity supported;Feet supported Sitting balance-Leahy Scale: Good     Standing balance support: Single extremity supported;During functional activity Standing balance-Leahy  Scale: Poor Standing balance comment: standing at sink for grooming (LUE propped on sink and thighs up against vanity)                           ADL either performed or assessed with clinical judgement   ADL Overall ADL's : Needs assistance/impaired     Grooming: Wash/dry face;Oral care;Min guard;Standing Grooming Details (indicate cue type and reason): at sink             Lower Body Dressing: Min guard;Sit to/from stand Lower Body Dressing Details (indicate cue type and reason): crosses one leg over the other to doff and don socks Toilet Transfer: Minimal assistance;Ambulation;Regular Toilet;Grab bars Toilet Transfer Details (indicate cue type and reason): no AD for ambulation                 Vision Patient Visual Report: No change from baseline            Cognition Arousal/Alertness: Awake/alert Behavior During Therapy: WFL for tasks assessed/performed Overall Cognitive Status: Impaired/Different from baseline Area of Impairment: Safety/judgement;Orientation                         Safety/Judgement: Decreased awareness of deficits(with posture for ambulation and moves fast)     General Comments: For orientation he got year wrong (1920) but self corrected. For place it took him increased time to come up with [facility]  Pertinent Vitals/ Pain       Pain Assessment: No/denies pain     Prior Functioning/Environment              Frequency  Min 2X/week        Progress Toward Goals  OT Goals(current goals can now be found in the care plan section)  Progress towards OT goals: Progressing toward goals     Plan Discharge plan remains appropriate       AM-PAC OT "6 Clicks" Daily Activity     Outcome Measure   Help from another person eating meals?: None Help from another person taking care of personal grooming?: A Little Help from another person toileting, which includes using toliet, bedpan, or  urinal?: A Little Help from another person bathing (including washing, rinsing, drying)?: A Little Help from another person to put on and taking off regular upper body clothing?: A Little Help from another person to put on and taking off regular lower body clothing?: A Little 6 Click Score: 19    End of Session Equipment Utilized During Treatment: Gait belt;Rolling walker  OT Visit Diagnosis: Other abnormalities of gait and mobility (R26.89);Other symptoms and signs involving cognitive function   Activity Tolerance Patient tolerated treatment well   Patient Left in bed;with call bell/phone within reach;with bed alarm set   Nurse Communication          Time: [id number] OT Time Calculation (min): 26 min  Charges: OT General Charges $OT Visit: 1 Visit OT Treatments $Self Care/Home Management : 8-22 mins  [place], OTR/L Acute [employer] Pager [phone] Office [phone]      Almon Register 04/12/2019, 3:46 PM

## 2019-04-12 NOTE — Progress Notes (Addendum)
Advanced Heart Failure Rounding Note  PCP-Cardiologist: No primary care provider on file.   Subjective:    Extubated 8/14. Had torsades 8/15 requiring shock and re-intubation. Extubated 8/16. Milrinone stopped 8/19. Co-OX 58%.   Yesterday agiatated and confused and was placed in restraints. Started on klonopin+ seroquel.  Today he is oriented. Denies SOB.   Objective:   Weight Range: 81.7 kg Body mass index is 26.59 kg/m.   Vital Signs:   Temp:  [97.6 F (36.4 C)-98.3 F (36.8 C)] 97.7 F (36.5 C) (08/21 0726) Pulse Rate:  [68-82] 68 (08/21 0900) Resp:  [25-45] 25 (08/21 0726) BP: (96-139)/(60-108) 139/71 (08/21 0726) SpO2:  [91 %-100 %] 100 % (08/21 0726) Weight:  [81.7 kg] 81.7 kg (08/21 0501) Last BM Date: 04/10/19  Weight change: Filed Weights   04/10/19 0431 04/11/19 0400 04/12/19 0501  Weight: 85.2 kg 83.3 kg 81.7 kg    Intake/Output:   Intake/Output Summary (Last 24 hours) at 04/12/2019 0905 Last data filed at 04/12/2019 0750 Gross per 24 hour  Intake 240 ml  Output 400 ml  Net -160 ml      Physical Exam  General:   No resp difficulty HEENT: normal Neck: supple. JVP 5-6  Carotids 2+ bilat; no bruits. No lymphadenopathy or thryomegaly appreciated. RIJ TLC Cor: PMI nondisplaced. Regular rate & rhythm. No rubs, gallops or murmurs. Lungs: clear with decreased BS throughtout Abdomen: soft, nontender, nondistended. No hepatosplenomegaly. No bruits or masses. Good bowel sounds. Extremities: no cyanosis, clubbing, rash, edema Neuro: alert & orientedx3, cranial nerves grossly intact. moves all 4 extremities w/o difficulty. Affect pleasant  Telemetry  NSR 70-80s  Personally reviewed.    EKG    N/a   Labs    CBC Recent Labs    04/11/19 0108 04/12/19 0530  WBC 10.2 10.0  HGB 9.8* 9.7*  HCT 30.6* 30.9*  MCV 94.4 95.4  PLT 621* [id number]   Basic Metabolic Panel Recent Labs    04/11/19 0108 04/12/19 0530  NA 142 141  K 3.7 3.7  CL 109 110   CO2 24 22  GLUCOSE 109* 83  BUN 22 17  CREATININE 0.78 0.82  CALCIUM 8.7* 8.6*  MG 2.0 1.9   Liver Function Tests No results for input(s): AST, ALT, ALKPHOS, BILITOT, PROT, ALBUMIN in the last 72 hours. No results for input(s): LIPASE, AMYLASE in the last 72 hours. Cardiac Enzymes No results for input(s): CKTOTAL, CKMB, CKMBINDEX, TROPONINI in the last 72 hours.  BNP: BNP (last 3 results) Recent Labs    03/31/19 0013  BNP 1,247.4*    ProBNP (last 3 results) No results for input(s): PROBNP in the last 8760 hours.   D-Dimer No results for input(s): DDIMER in the last 72 hours. Hemoglobin A1C No results for input(s): HGBA1C in the last 72 hours. Fasting Lipid Panel Recent Labs    04/12/19 0530  TRIG 99   Thyroid Function Tests No results for input(s): TSH, T4TOTAL, T3FREE, THYROIDAB in the last 72 hours.  Invalid input(s): FREET3  Other results:   Imaging    No results found.   Medications:     Scheduled Medications: . sodium chloride   Intravenous Once  . amiodarone  200 mg Oral BID  . aspirin  81 mg Oral Daily  . atorvastatin  80 mg Oral q1800  . chlorhexidine  15 mL Mouth Rinse BID  . Chlorhexidine Gluconate Cloth  6 each Topical Daily  . digoxin  0.125 mg Oral Daily  .  feeding supplement (ENSURE ENLIVE)  237 mL Oral BID BM  . heparin injection (subcutaneous)  5,000 Units Subcutaneous Q8H  . insulin aspart  0-9 Units Subcutaneous Q4H  . insulin aspart  3 Units Subcutaneous Q4H  . insulin glargine  15 Units Subcutaneous Daily  . mouth rinse  15 mL Mouth Rinse q12n4p  . pantoprazole (PROTONIX) IV  40 mg Intravenous Daily  . QUEtiapine  25 mg Oral BID  . sacubitril-valsartan  1 tablet Oral BID  . sodium chloride flush  10-40 mL Intracatheter Q12H  . sorbitol  30 mL Per Tube Once  . spironolactone  25 mg Oral Daily  . Thrombi-Pad  1 each Topical Once  . ticagrelor  90 mg Oral BID    Infusions: . sodium chloride Stopped (04/06/19 0930)     PRN Medications: acetaminophen, clonazepam, ipratropium-albuterol, nitroGLYCERIN, ondansetron (ZOFRAN) IV, Resource ThickenUp Clear, sodium chloride flush   *  Assessment/Plan    1. Cardiac arrest/VT/torsades on 8/15 - rhythm stable on IV amio - Keep K> 4.0 Mg > 2.0 -EP following for ICD.   2. Acute systolic HF -> Cardiogenic shock due to acute anterior MI - s/p PCI/DES to LAD on 8/9 - Impella removed 8/12 - Stable off Milrinone.  CO-OX 55%. Remove central line.  - Volume status stable.  - Continue digoxin 0.125  - Continue spiro to 25 mg daily.   - Continue entresto 24-26 mg twice a day.   - Start low dose carvedilol - Echo 20-25%  3. CAD with acute anterior MI 8/9 - s/p PCI/DES to LAD. 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 Acute hypoxic respiratory failure - in setting of #1 - extubated 8/14. Reintubated 8/15 in setting of torsades. Extubated 8/16.  -On room air.    4. DM2 - new diagnosis - cover with SSI  5. COPD - ongoing tobacco use - needs cessation   6. NSVT/Torsades - Improved on amio drip. Has had > 10 gram load amio. Continue amio 200 mg twice a day.   - Keep K> 4.0 Mg > 2.0 - Supp K .  - EP appreciated.  - Remove central line.  - Confusion resolving.   7. Anemia, acute blood loss - Got 1u RBC on 8/15.  - Hgb stable today 9.7  8. Delirium  Continue Klonopin + seroquel. Resolving.   Ambulate today.   PT/OT following. Recommending HH--> ordered. Considered CIR but no insurance.    Length of Stay: [place], NP  04/12/2019, 9:05 AM  Advanced Heart Failure Team Pager [phone] (M-F; [place])  Please contact [facility] for night-coverage after hours (4p -7a ) and weekends on [email or web address]  Patient seen and examined with the above-signed Advanced Practice Provider and/or Housestaff. I personally reviewed laboratory data, imaging studies and  relevant notes. I independently examined the patient and formulated the important aspects of the plan. I have edited the note to reflect any of my changes or salient points. I have personally discussed the plan with the patient and/or family.  Delirium much improved. Denias CP or SOB. Rhythm stable. Will add low dose carvedilol. Pull central line. Ambulate. Hopefully can get ICD soon.   [name], MD  9:29 AM

## 2019-04-12 NOTE — TOC Progression Note (Addendum)
Transition of Care [facility]) - Progression Note    Patient Details  Name: Stephen Jennings MRN: [id number] Date of Birth: 1956/01/16  Transition of Care [facility]) CM/SW Contact  Zenon Mayo, RN Phone Number: 04/12/2019, 2:36 PM  Clinical Narrative:    Patient does not have insurance or PCP, NCM scheduled a follow up apt at the [facility] clinic for 9/16 at 10:30, he will be on Entresto and Brilinta.  He has a follow up apt at [facility] clinic 9/16 at 10:30. Will need patient ast application for medications proir to dc. Patient will need charity for [facility], will need to see if he qualifies.          Expected Discharge Plan and Services           Expected Discharge Date: (unknown)                                     Social Determinants of Health (SDOH) Interventions    Readmission Risk Interventions No flowsheet data found.

## 2019-04-12 NOTE — Progress Notes (Signed)
Physical Therapy Treatment Patient Details Name: Stephen Jennings MRN: [id number] DOB: Oct 08, 1955 Today's Date: 04/12/2019    History of Present Illness Pt is a 63 y/o M with no known PMHx who presented with respiratory distress via EMS, intubated in the ED 8/9.  Code STEMI was called and patient was taken to the cath lab with complete occlusion of the LAD with cardiogenic shock. Pt was extubated 8/14 but was reintubated on 8/15 when he suffered a cardiac arrest requiring defibrillation x2, 2 rounds of epi approximately 10 minutes CPR with ROSC. He was extubated 8/16.    PT Comments    Pt admitted with above diagnosis. Pt was able to ambulate with min assist with mod cues for safety as pt flexes trunk forward and cannot stand fully upright with or without the RW.  Pt was able to incr distance and tolerance of therapy session today.  Will need 24 hour care at home and use of RW.  Will continue acute PT.   Pt currently with functional limitations due to balance and endurance deficits. Pt will benefit from skilled PT to increase their independence and safety with mobility to allow discharge to the venue listed below.     Follow Up Recommendations  Home health PT;Supervision/Assistance - 24 hour     Equipment Recommendations  Rolling walker with 5" wheels    Recommendations for Other Services       Precautions / Restrictions Precautions Precautions: Fall Restrictions Weight Bearing Restrictions: No    Mobility  Bed Mobility Overal bed mobility: Needs Assistance Bed Mobility: Supine to Sit;Sit to Supine     Supine to sit: Supervision Sit to supine: Supervision      Transfers Overall transfer level: Needs assistance Equipment used: Rolling walker (2 wheeled);None Transfers: Sit to/from Stand Sit to Stand: Min guard         General transfer comment: cues for hand placement. Started out with ambulation without AD (20 feet) then patient pointed to RW that I was carrying and wanted  it (completed ambulation at RW level), min A for ambulation with Mod VCs for upright posture instead of forward head posture  Ambulation/Gait Ambulation/Gait assistance: Min assist;Mod assist Gait Distance (Feet): 280 Feet Assistive device: Rolling walker (2 wheeled);None Gait Pattern/deviations: Step-through pattern;Decreased stride length;Drifts right/left;Trunk flexed;Wide base of support   Gait velocity interpretation: <1.31 ft/sec, indicative of household ambulator General Gait Details: Pt initially started without RW and pt with flexed posture and staggers at times.  Pt did better with RW however still flexes and needs constant mod cues to stand tall and cannot sustain unless constantly cued.  Pt never needed steadying assist with RW but did without RW need steadying due to the forward lean.    Stairs             Wheelchair Mobility    Modified Rankin (Stroke Patients Only)       Balance Overall balance assessment: Needs assistance Sitting-balance support: No upper extremity supported;Feet supported Sitting balance-Leahy Scale: Good     Standing balance support: Single extremity supported;During functional activity Standing balance-Leahy Scale: Poor Standing balance comment: standing at sink for grooming (LUE propped on sink and thighs up against vanity)                            Cognition Arousal/Alertness: Awake/alert Behavior During Therapy: WFL for tasks assessed/performed Overall Cognitive Status: Impaired/Different from baseline Area of Impairment: Safety/judgement;Orientation  Current Attention Level: Sustained Memory: Decreased recall of precautions;Decreased short-term memory Following Commands: Follows one step commands with increased time;Follows multi-step commands inconsistently Safety/Judgement: Decreased awareness of deficits(with posture for ambulation and moves fast) Awareness: Emergent Problem Solving: Slow  processing;Requires verbal cues;Difficulty sequencing General Comments: For orientation he got year wrong (1920) but self corrected. For place it took him increased time to come up with [facility]      Exercises      General Comments General comments (skin integrity, edema, etc.): VSS on RA with activity      Pertinent Vitals/Pain Pain Assessment: No/denies pain Faces Pain Scale: No hurt    Home Living                      Prior Function            PT Goals (current goals can now be found in the care plan section) Acute Rehab PT Goals Patient Stated Goal: to go home Progress towards PT goals: Progressing toward goals    Frequency    Min 3X/week      PT Plan Current plan remains appropriate    Co-evaluation PT/OT/SLP Co-Evaluation/Treatment: Yes Reason for Co-Treatment: For patient/therapist safety PT goals addressed during session: Mobility/safety with mobility        AM-PAC PT "6 Clicks" Mobility   Outcome Measure  Help needed turning from your back to your side while in a flat bed without using bedrails?: A Little Help needed moving from lying on your back to sitting on the side of a flat bed without using bedrails?: A Little Help needed moving to and from a bed to a chair (including a wheelchair)?: A Little Help needed standing up from a chair using your arms (e.g., wheelchair or bedside chair)?: A Little Help needed to walk in hospital room?: A Little Help needed climbing 3-5 steps with a railing? : A Lot 6 Click Score: 17    End of Session Equipment Utilized During Treatment: Gait belt Activity Tolerance: Patient tolerated treatment well Patient left: with call bell/phone within reach;in bed;with bed alarm set Nurse Communication: Mobility status PT Visit Diagnosis: Other abnormalities of gait and mobility (R26.89);Muscle weakness (generalized) (M62.81)     Time: [id number] PT Time Calculation (min) (ACUTE ONLY): 26 min  Charges:  $Gait  Training: 8-22 mins                     [place] Pager:  [phone]  Office:  [place] 04/12/2019, 4:10 PM

## 2019-04-12 NOTE — Progress Notes (Signed)
CARDIAC REHAB PHASE I   PRE:  Rate/Rhythm: 67 SR  BP:  Supine: 96/60  Sitting:   Standing:    SaO2: 94%RA  MODE:  Ambulation: 300 ft   POST:  Rate/Rhythm: 83 SR  BP:  Supine: 114/68  Sitting:   Standing:    SaO2: 100%RA 1320-1350 When we sat pt up to side of bed, noticed that pt's gown and bed wet. Helped pt get cleaned and changed bed. Pt walked 300 ft on RA with gait belt use, rolling walker and asst x 2. Pt denied dizziness or CP. Tolerated well. To bed with alarm on and wife in room. Pt stated felt good to walk. Very cooperative.   Graylon Good, RN BSN  04/12/2019 1:45 PM

## 2019-04-12 NOTE — Plan of Care (Signed)
  Problem: Education: Goal: Knowledge of General Education information will improve Description: Including pain rating scale, medication(s)/side effects and non-pharmacologic comfort measures Outcome: Progressing   Problem: Health Behavior/Discharge Planning: Goal: Ability to manage health-related needs will improve Outcome: Progressing   Problem: Clinical Measurements: Goal: Ability to maintain clinical measurements within normal limits will improve Outcome: Progressing Goal: Will remain free from infection Outcome: Progressing Goal: Diagnostic test results will improve Outcome: Progressing Goal: Respiratory complications will improve Outcome: Progressing Goal: Cardiovascular complication will be avoided Outcome: Progressing   Problem: Activity: Goal: Risk for activity intolerance will decrease Outcome: Progressing   Problem: Nutrition: Goal: Adequate nutrition will be maintained Outcome: Progressing   Problem: Coping: Goal: Level of anxiety will decrease Outcome: Progressing   Problem: Elimination: Goal: Will not experience complications related to bowel motility Outcome: Progressing Goal: Will not experience complications related to urinary retention Outcome: Progressing   Problem: Pain Managment: Goal: General experience of comfort will improve Outcome: Progressing   Problem: Safety: Goal: Ability to remain free from injury will improve Outcome: Progressing   Problem: Skin Integrity: Goal: Risk for impaired skin integrity will decrease Outcome: Progressing   Problem: Education: Goal: Ability to demonstrate management of disease process will improve Outcome: Progressing Goal: Ability to verbalize understanding of medication therapies will improve Outcome: Progressing Goal: Individualized Educational Video(s) Outcome: Progressing   Problem: Activity: Goal: Capacity to carry out activities will improve Outcome: Progressing   Problem: Cardiac: Goal:  Ability to achieve and maintain adequate cardiopulmonary perfusion will improve Outcome: Progressing   Problem: Activity: Goal: Ability to tolerate increased activity will improve Outcome: Progressing   Problem: Respiratory: Goal: Ability to maintain a clear airway and adequate ventilation will improve Outcome: Progressing   Problem: Education: Goal: Understanding of cardiac disease, CV risk reduction, and recovery process will improve Outcome: Progressing Goal: Understanding of medication regimen will improve Outcome: Progressing Goal: Individualized Educational Video(s) Outcome: Progressing   Problem: Activity: Goal: Ability to tolerate increased activity will improve Outcome: Progressing   Problem: Cardiac: Goal: Ability to achieve and maintain adequate cardiopulmonary perfusion will improve Outcome: Progressing Goal: Vascular access site(s) Level 0-1 will be maintained Outcome: Progressing   Problem: Health Behavior/Discharge Planning: Goal: Ability to safely manage health-related needs after discharge will improve Outcome: Progressing   

## 2019-04-12 NOTE — Progress Notes (Signed)
  Speech Language Pathology Treatment: Dysphagia  Patient Details Name: Stephen Jennings MRN: [id number] DOB: 04/06/1956 Today's Date: 04/12/2019 Time: 8841-6606 SLP Time Calculation (min) (ACUTE ONLY): 12 min  Assessment / Plan / Recommendation Clinical Impression  F/u after MBS - pt's diet had been advanced to regular solids, thin liquids.  He demonstrates improved orientation today and ability to follow commands.  Delirium abating during our session. Pt self fed crackers and thin liquids with no overt s/s of aspiration. He continues to demonstrate increased WOB noted during intake; reiterated to pt the need to pace himself, prioritize rest breaks for breathing over eating/drinking too quickly.  Pt verbalizes understanding. No further swallowing f/u warranted - our service will sign off.    HPI HPI: 62M with no known PMHx who presented with respiratory distress via EMS, intubated in the ED 8/9.  Code STEMI was called and patient was taken to the cath lab with complete occlusion of the LAD with cardiogenic shock. Pt was extubated 8/14 but was reintubated on 8/15 when he suffered a cardiac arrest requiring defibrillation x2, 2 rounds of epi approximately 10 minutes CPR with ROSC. He was extubated 8/16.      SLP Plan  All goals met       Recommendations  Diet recommendations: Regular;Thin liquid Liquids provided via: Cup;Straw Medication Administration: Whole meds with puree Supervision: Patient able to self feed Compensations: Slow rate                Oral Care Recommendations: Oral care BID Follow up Recommendations: None Plan: All goals met       GO                Juan Quam Laurice 04/12/2019, 11:33 AM Estill Bamberg L. Tivis Ringer, [place] Office number [phone] Pager [phone]

## 2019-04-12 NOTE — Progress Notes (Addendum)
Patient remains with AMS, agitated/combative with staff. VSS Dr. Lovena Le has seen the patient  EP will stand by, please recall when mental status improves, ready for revisit of ICD implant.  Tommye Standard, [name]  EP Attending  Agree with above. The patient remains encephalopathic although he appears to be a little calmer today. Plan for ICD insertion next week.   Mikle Bosworth.D.

## 2019-04-12 NOTE — TOC Benefit Eligibility Note (Signed)
Transition of Care [facility]) Benefit Eligibility Note    Patient Details  Name: Stephen Jennings MRN: [id number] Date of Birth: [birth year]                           Additional Notes: This Paient does not have any insurance    [employer] Phone Number: 04/12/2019, 11:35 AM

## 2019-04-13 DIAGNOSIS — I2102 ST elevation (STEMI) myocardial infarction involving left anterior descending coronary artery: Principal | ICD-10-CM

## 2019-04-13 LAB — CBC
HCT: 29.4 % — ABNORMAL LOW (ref 39.0–52.0)
Hemoglobin: 9.1 g/dL — ABNORMAL LOW (ref 13.0–17.0)
MCH: 29.6 pg (ref 26.0–34.0)
MCHC: 31 g/dL (ref 30.0–36.0)
MCV: 95.8 fL (ref 80.0–100.0)
Platelets: 658 10*3/uL — ABNORMAL HIGH (ref 150–400)
RBC: 3.07 MIL/uL — ABNORMAL LOW (ref 4.22–5.81)
RDW: 14.6 % (ref 11.5–15.5)
WBC: 7.9 10*3/uL (ref 4.0–10.5)
nRBC: 0 % (ref 0.0–0.2)

## 2019-04-13 LAB — GLUCOSE, CAPILLARY
Glucose-Capillary: 89 mg/dL (ref 70–99)
Glucose-Capillary: 122 mg/dL — ABNORMAL HIGH (ref 70–99)
Glucose-Capillary: 89 mg/dL (ref 70–99)
Glucose-Capillary: 117 mg/dL — ABNORMAL HIGH (ref 70–99)
Glucose-Capillary: 100 mg/dL — ABNORMAL HIGH (ref 70–99)

## 2019-04-13 LAB — BASIC METABOLIC PANEL
Anion gap: 8 (ref 5–15)
BUN: 18 mg/dL (ref 8–23)
CO2: 25 mmol/L (ref 22–32)
Calcium: 8.5 mg/dL — ABNORMAL LOW (ref 8.9–10.3)
Chloride: 109 mmol/L (ref 98–111)
Creatinine, Ser: 0.92 mg/dL (ref 0.61–1.24)
GFR calc Af Amer: >60 mL/min (ref >60)
GFR calc non Af Amer: >60 mL/min (ref >60)
Glucose, Bld: 87 mg/dL (ref 70–99)
Potassium: 4.2 mmol/L (ref 3.5–5.1)
Sodium: 142 mmol/L (ref 135–145)

## 2019-04-13 LAB — LACTATE DEHYDROGENASE: LDH: 252 U/L — ABNORMAL HIGH (ref 98–192)

## 2019-04-13 LAB — MAGNESIUM: Magnesium: 2.2 mg/dL (ref 1.7–2.4)

## 2019-04-13 MED ORDER — AMIODARONE HCL 200 MG PO TABS
200.0000 mg | ORAL_TABLET | Freq: Every day | ORAL | Status: DC
  Administered 2019-04-14 – 2019-04-16 (×3): 200 mg via ORAL
  Filled 2019-04-13 (×3): qty 1

## 2019-04-13 MED ORDER — LOPERAMIDE HCL 2 MG PO CAPS
2.0000 mg | ORAL_CAPSULE | Freq: Once | ORAL | Status: AC
  Administered 2019-04-13: 21:00:00 2 mg via ORAL
  Filled 2019-04-13: qty 1

## 2019-04-13 NOTE — Plan of Care (Signed)
  Problem: Education: Goal: Knowledge of General Education information will improve Description: Including pain rating scale, medication(s)/side effects and non-pharmacologic comfort measures Outcome: Progressing   Problem: Health Behavior/Discharge Planning: Goal: Ability to manage health-related needs will improve Outcome: Progressing   Problem: Clinical Measurements: Goal: Ability to maintain clinical measurements within normal limits will improve Outcome: Progressing Goal: Will remain free from infection Outcome: Progressing Goal: Diagnostic test results will improve Outcome: Progressing Goal: Respiratory complications will improve Outcome: Progressing Goal: Cardiovascular complication will be avoided Outcome: Progressing   Problem: Activity: Goal: Risk for activity intolerance will decrease Outcome: Progressing   Problem: Nutrition: Goal: Adequate nutrition will be maintained Outcome: Progressing   Problem: Coping: Goal: Level of anxiety will decrease Outcome: Progressing   Problem: Elimination: Goal: Will not experience complications related to bowel motility Outcome: Progressing Goal: Will not experience complications related to urinary retention Outcome: Progressing   Problem: Pain Managment: Goal: General experience of comfort will improve Outcome: Progressing   Problem: Safety: Goal: Ability to remain free from injury will improve Outcome: Progressing   Problem: Skin Integrity: Goal: Risk for impaired skin integrity will decrease Outcome: Progressing   Problem: Education: Goal: Ability to demonstrate management of disease process will improve Outcome: Progressing Goal: Ability to verbalize understanding of medication therapies will improve Outcome: Progressing Goal: Individualized Educational Video(s) Outcome: Progressing   Problem: Activity: Goal: Capacity to carry out activities will improve Outcome: Progressing   Problem: Cardiac: Goal:  Ability to achieve and maintain adequate cardiopulmonary perfusion will improve Outcome: Progressing   Problem: Activity: Goal: Ability to tolerate increased activity will improve Outcome: Progressing   Problem: Respiratory: Goal: Ability to maintain a clear airway and adequate ventilation will improve Outcome: Progressing   Problem: Education: Goal: Understanding of cardiac disease, CV risk reduction, and recovery process will improve Outcome: Progressing Goal: Understanding of medication regimen will improve Outcome: Progressing Goal: Individualized Educational Video(s) Outcome: Progressing   Problem: Education: Goal: Understanding of medication regimen will improve Outcome: Progressing   Problem: Health Behavior/Discharge Planning: Goal: Ability to safely manage health-related needs after discharge will improve Outcome: Progressing   Problem: Cardiac: Goal: Ability to achieve and maintain adequate cardiopulmonary perfusion will improve Outcome: Progressing Goal: Vascular access site(s) Level 0-1 will be maintained Outcome: Progressing

## 2019-04-13 NOTE — Progress Notes (Signed)
Advanced Heart Failure Rounding Note  PCP-Cardiologist: No primary care provider on file.   Subjective:    Extubated 8/14. Had torsades 8/15 requiring shock and re-intubation. Extubated 8/16. Milrinone stopped 8/19.   Confusion resolved this am. Sitting in chair. Comfortable. Denies CP or SOB. Asking when he can go home.   Rhythm stable SBPs in 80-90s. No dizziness  Objective:   Weight Range: 80.9 kg Body mass index is 26.33 kg/m.   Vital Signs:   Temp:  [97.3 F (36.3 C)-99 F (37.2 C)] 97.5 F (36.4 C) (08/22 0731) Pulse Rate:  [53-73] 61 (08/22 0500) Resp:  [18-35] 25 (08/22 0500) BP: (83-123)/(44-96) 84/58 (08/22 0500) SpO2:  [93 %-98 %] 96 % (08/22 0500) Weight:  [80.9 kg] 80.9 kg (08/22 0250) Last BM Date: 04/13/19  Weight change: Filed Weights   04/11/19 0400 04/12/19 0501 04/13/19 0250  Weight: 83.3 kg 81.7 kg 80.9 kg    Intake/Output:   Intake/Output Summary (Last 24 hours) at 04/13/2019 0924 Last data filed at 04/13/2019 0300 Gross per 24 hour  Intake 835.18 ml  Output 1075 ml  Net -239.82 ml      Physical Exam   General:  Well appearing. No resp difficulty HEENT: normal Neck: supple. no JVD. Carotids 2+ bilat; no bruits. No lymphadenopathy or thryomegaly appreciated. Cor: PMI nondisplaced. Regular rate & rhythm. No rubs, gallops or murmurs. Lungs: clear Abdomen: soft, nontender, nondistended. No hepatosplenomegaly. No bruits or masses. Good bowel sounds. Extremities: no cyanosis, clubbing, rash, edema Neuro: alert & orientedx3, cranial nerves grossly intact. moves all 4 extremities w/o difficulty. Affect pleasant   Telemetry   NSR 60-70s  Personally reviewed.    EKG    N/a   Labs    CBC Recent Labs    04/12/19 0530 04/13/19 0211  WBC 10.0 7.9  HGB 9.7* 9.1*  HCT 30.9* 29.4*  MCV 95.4 95.8  PLT 662* [id number]*   Basic Metabolic Panel Recent Labs    04/12/19 0530 04/13/19 0211  NA 141 142  K 3.7 4.2  CL 110 109  CO2 22 25   GLUCOSE 83 87  BUN 17 18  CREATININE 0.82 0.92  CALCIUM 8.6* 8.5*  MG 1.9 2.2   Liver Function Tests No results for input(s): AST, ALT, ALKPHOS, BILITOT, PROT, ALBUMIN in the last 72 hours. No results for input(s): LIPASE, AMYLASE in the last 72 hours. Cardiac Enzymes No results for input(s): CKTOTAL, CKMB, CKMBINDEX, TROPONINI in the last 72 hours.  BNP: BNP (last 3 results) Recent Labs    03/31/19 0013  BNP 1,247.4*    ProBNP (last 3 results) No results for input(s): PROBNP in the last 8760 hours.   D-Dimer No results for input(s): DDIMER in the last 72 hours. Hemoglobin A1C No results for input(s): HGBA1C in the last 72 hours. Fasting Lipid Panel Recent Labs    04/12/19 0530  TRIG 99   Thyroid Function Tests No results for input(s): TSH, T4TOTAL, T3FREE, THYROIDAB in the last 72 hours.  Invalid input(s): FREET3  Other results:   Imaging    No results found.   Medications:     Scheduled Medications: . sodium chloride   Intravenous Once  . amiodarone  200 mg Oral BID  . aspirin  81 mg Oral Daily  . atorvastatin  80 mg Oral q1800  . carvedilol  3.125 mg Oral BID WC  . chlorhexidine  15 mL Mouth Rinse BID  . Chlorhexidine Gluconate Cloth  6 each Topical Daily  .  digoxin  0.125 mg Oral Daily  . feeding supplement (ENSURE ENLIVE)  237 mL Oral BID BM  . heparin injection (subcutaneous)  5,000 Units Subcutaneous Q8H  . insulin aspart  0-9 Units Subcutaneous Q4H  . insulin glargine  15 Units Subcutaneous Daily  . mouth rinse  15 mL Mouth Rinse q12n4p  . pantoprazole  40 mg Oral Daily  . QUEtiapine  25 mg Oral BID  . sacubitril-valsartan  1 tablet Oral BID  . sodium chloride flush  10-40 mL Intracatheter Q12H  . sorbitol  30 mL Per Tube Once  . spironolactone  25 mg Oral Daily  . Thrombi-Pad  1 each Topical Once  . ticagrelor  90 mg Oral BID    Infusions: . sodium chloride Stopped (04/06/19 0930)    PRN Medications: acetaminophen, clonazepam,  ipratropium-albuterol, nitroGLYCERIN, ondansetron (ZOFRAN) IV, Resource ThickenUp Clear, sodium chloride flush   *  Assessment/Plan    1. Cardiac arrest/VT/torsades on 8/15 - rhythm stable on IV amio - Keep K> 4.0 Mg > 2.0. K 4.2 today - EP following for ICD. Hopefully Monday  2. Acute systolic HF -> Cardiogenic shock due to acute anterior MI - s/p PCI/DES to LAD on 8/9 - Impella removed 8/12 - Volume status stable. No need for lasix - Continue digoxin 0.125  - Continue spiro to 25 mg daily.   - Continue entresto 24-26 mg twice a day. May need to change to losartan if BP remains soft - Continuelow dose carvedilol - Echo 20-25%  3. CAD with acute anterior MI 8/9 - s/p PCI/DES to LAD. 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 Acute hypoxic respiratory failure - in setting of #1 - extubated 8/14. Reintubated 8/15 in setting of torsades. Extubated 8/16.  -On room air.    4. DM2 - new diagnosis - cover with SSI  5. COPD - ongoing tobacco use - needs cessation   6. NSVT/Torsades - Improved on amio drip. Has had > 10 gram load amio.  - Decrease amio to 200 daily - Keep K> 4.0 Mg > 2.0 - EP appreciated. ICD hopefully Monday  7. Anemia, acute blood loss - Got 1u RBC on 8/15.  - Hgb stable today 9.1  8. Delirium  - resolved Continue Klonopin + seroquel.  Ambulate today.   PT/OT following. Recommending HH--> ordered. Considered CIR but no insurance.   I called his wife with an update.    Length of Stay: 13  [name], MD  04/13/2019, 9:24 AM  Advanced Heart Failure Team Pager [phone] (M-F; 7a - 4p)  Please contact [facility] for night-coverage after hours (4p -7a ) and weekends on [email or web address]

## 2019-04-13 NOTE — Progress Notes (Signed)
MEWS Guidelines - (patients age 63 and over)  Patient MEWS yellow-red r/t soft pressures, increase in RR. This is not a change. Will continue to monitor according to MEWS guidelines.   Red - At High Risk for Deterioration Yellow - At risk for Deterioration  1. Go to room and assess patient 2. Validate data. Is this patient's baseline? If data confirmed: 3. Is this an acute change? 4. Administer prn meds/treatments as ordered. 5. Note Sepsis score 6. Review goals of care 7. Sports coach, RRT nurse and Provider. 8. Ask Provider to come to bedside.  9. Document patient condition/interventions/response. 10. Increase frequency of vital signs and focused assessments to at least q15 minutes x 4, then q30 minutes x2. - If stable, then q1h x3, then q4h x3 and then q8h or dept. routine. - If unstable, contact Provider & RRT nurse. Prepare for possible transfer. 11. Add entry in progress notes using the smart phrase ".MEWS". 1. Go to room and assess patient 2. Validate data. Is this patient's baseline? If data confirmed: 3. Is this an acute change? 4. Administer prn meds/treatments as ordered? 5. Note Sepsis score 6. Review goals of care 7. Sports coach and Provider 8. Call RRT nurse as needed. 9. Document patient condition/interventions/response. 10. Increase frequency of vital signs and focused assessments to at least q2h x2. - If stable, then q4h x2 and then q8h or dept. routine. - If unstable, contact Provider & RRT nurse. Prepare for possible transfer. 11. Add entry in progress notes using the smart phrase ".MEWS".  Green - Likely stable Lavender - Comfort Care Only  1. Continue routine/ordered monitoring.  2. Review goals of care. 1. Continue routine/ordered monitoring. 2. Review goals of care.

## 2019-04-13 NOTE — Progress Notes (Signed)
Pt c/o 4 episodes of loose stool, Dr Christell Constant informed, order given for 1 dose of imodium tablet. Once and observation.

## 2019-04-14 DIAGNOSIS — I5021 Acute systolic (congestive) heart failure: Secondary | ICD-10-CM

## 2019-04-14 LAB — BASIC METABOLIC PANEL
Anion gap: 9 (ref 5–15)
BUN: 16 mg/dL (ref 8–23)
CO2: 25 mmol/L (ref 22–32)
Calcium: 8.6 mg/dL — ABNORMAL LOW (ref 8.9–10.3)
Chloride: 106 mmol/L (ref 98–111)
Creatinine, Ser: 0.95 mg/dL (ref 0.61–1.24)
GFR calc Af Amer: >60 mL/min (ref >60)
GFR calc non Af Amer: >60 mL/min (ref >60)
Glucose, Bld: 78 mg/dL (ref 70–99)
Potassium: 4.1 mmol/L (ref 3.5–5.1)
Sodium: 140 mmol/L (ref 135–145)

## 2019-04-14 LAB — CBC
HCT: 30.1 % — ABNORMAL LOW (ref 39.0–52.0)
Hemoglobin: 9.3 g/dL — ABNORMAL LOW (ref 13.0–17.0)
MCH: 29.2 pg (ref 26.0–34.0)
MCHC: 30.9 g/dL (ref 30.0–36.0)
MCV: 94.7 fL (ref 80.0–100.0)
Platelets: 672 10*3/uL — ABNORMAL HIGH (ref 150–400)
RBC: 3.18 MIL/uL — ABNORMAL LOW (ref 4.22–5.81)
RDW: 14.4 % (ref 11.5–15.5)
WBC: 7.7 10*3/uL (ref 4.0–10.5)
nRBC: 0 % (ref 0.0–0.2)

## 2019-04-14 LAB — GLUCOSE, CAPILLARY
Glucose-Capillary: 73 mg/dL (ref 70–99)
Glucose-Capillary: 97 mg/dL (ref 70–99)
Glucose-Capillary: 133 mg/dL — ABNORMAL HIGH (ref 70–99)
Glucose-Capillary: 150 mg/dL — ABNORMAL HIGH (ref 70–99)
Glucose-Capillary: 119 mg/dL — ABNORMAL HIGH (ref 70–99)

## 2019-04-14 LAB — LACTATE DEHYDROGENASE: LDH: 226 U/L — ABNORMAL HIGH (ref 98–192)

## 2019-04-14 LAB — MAGNESIUM: Magnesium: 1.9 mg/dL (ref 1.7–2.4)

## 2019-04-14 MED ORDER — MAGNESIUM SULFATE IN D5W 1-5 GM/100ML-% IV SOLN
1.0000 g | Freq: Once | INTRAVENOUS | Status: AC
  Administered 2019-04-14: 16:00:00 1 g via INTRAVENOUS
  Filled 2019-04-14: qty 100

## 2019-04-14 NOTE — Progress Notes (Signed)
Advanced Heart Failure Rounding Note  PCP-Cardiologist: No primary care provider on file.   Subjective:    Extubated 8/14. Had torsades 8/15 requiring shock and re-intubation. Extubated 8/16. Milrinone stopped 8/19.   Confusion resolved this am. Sitting in chair.   Feels good. Anxious to go home. Denies CP or SOB. Entresto on hold with SBP 85-95.   Rhythm stable SBPs in 80-90s.   Objective:   Weight Range: 79.6 kg Body mass index is 25.9 kg/m.   Vital Signs:   Temp:  [97.7 F (36.5 C)-98.7 F (37.1 C)] 97.7 F (36.5 C) (08/23 0738) Pulse Rate:  [50-57] 55 (08/23 0906) Resp:  [19-26] 25 (08/23 0738) BP: (93-104)/(55-70) 101/65 (08/23 0738) SpO2:  [97 %-99 %] 99 % (08/22 1940) Weight:  [79.6 kg] 79.6 kg (08/23 0500) Last BM Date: 04/13/19  Weight change: Filed Weights   04/12/19 0501 04/13/19 0250 04/14/19 0500  Weight: 81.7 kg 80.9 kg 79.6 kg    Intake/Output:   Intake/Output Summary (Last 24 hours) at 04/14/2019 0950 Last data filed at 04/13/2019 2132 Gross per 24 hour  Intake 232 ml  Output -  Net 232 ml      Physical Exam   General:  Well appearing. No resp difficulty HEENT: normal Neck: supple. no JVD. Carotids 2+ bilat; no bruits. No lymphadenopathy or thryomegaly appreciated. Cor: PMI nondisplaced. Regular rate & rhythm. No rubs, gallops or murmurs. Lungs: clear Abdomen: soft, nontender, nondistended. No hepatosplenomegaly. No bruits or masses. Good bowel sounds. Extremities: no cyanosis, clubbing, rash, edema Neuro: alert & orientedx3, cranial nerves grossly intact. moves all 4 extremities w/o difficulty. Affect pleasant   Telemetry   NSR 50-60s  Personally reviewed.    EKG    N/a   Labs    CBC Recent Labs    04/13/19 0211 04/14/19 0218  WBC 7.9 7.7  HGB 9.1* 9.3*  HCT 29.4* 30.1*  MCV 95.8 94.7  PLT 658* [id number]*   Basic Metabolic Panel Recent Labs    04/13/19 0211 04/14/19 0218  NA 142 140  K 4.2 4.1  CL 109 106  CO2  25 25  GLUCOSE 87 78  BUN 18 16  CREATININE 0.92 0.95  CALCIUM 8.5* 8.6*  MG 2.2 1.9   Liver Function Tests No results for input(s): AST, ALT, ALKPHOS, BILITOT, PROT, ALBUMIN in the last 72 hours. No results for input(s): LIPASE, AMYLASE in the last 72 hours. Cardiac Enzymes No results for input(s): CKTOTAL, CKMB, CKMBINDEX, TROPONINI in the last 72 hours.  BNP: BNP (last 3 results) Recent Labs    03/31/19 0013  BNP 1,247.4*    ProBNP (last 3 results) No results for input(s): PROBNP in the last 8760 hours.   D-Dimer No results for input(s): DDIMER in the last 72 hours. Hemoglobin A1C No results for input(s): HGBA1C in the last 72 hours. Fasting Lipid Panel Recent Labs    04/12/19 0530  TRIG 99   Thyroid Function Tests No results for input(s): TSH, T4TOTAL, T3FREE, THYROIDAB in the last 72 hours.  Invalid input(s): FREET3  Other results:   Imaging    No results found.   Medications:     Scheduled Medications: . sodium chloride   Intravenous Once  . amiodarone  200 mg Oral Daily  . aspirin  81 mg Oral Daily  . atorvastatin  80 mg Oral q1800  . carvedilol  3.125 mg Oral BID WC  . chlorhexidine  15 mL Mouth Rinse BID  . Chlorhexidine Gluconate Cloth  6 each Topical Daily  . digoxin  0.125 mg Oral Daily  . feeding supplement (ENSURE ENLIVE)  237 mL Oral BID BM  . heparin injection (subcutaneous)  5,000 Units Subcutaneous Q8H  . insulin aspart  0-9 Units Subcutaneous Q4H  . insulin glargine  15 Units Subcutaneous Daily  . mouth rinse  15 mL Mouth Rinse q12n4p  . pantoprazole  40 mg Oral Daily  . QUEtiapine  25 mg Oral BID  . sacubitril-valsartan  1 tablet Oral BID  . sodium chloride flush  10-40 mL Intracatheter Q12H  . sorbitol  30 mL Per Tube Once  . spironolactone  25 mg Oral Daily  . Thrombi-Pad  1 each Topical Once  . ticagrelor  90 mg Oral BID    Infusions: . sodium chloride Stopped (04/06/19 0930)    PRN Medications: acetaminophen,  clonazepam, ipratropium-albuterol, nitroGLYCERIN, ondansetron (ZOFRAN) IV, Resource ThickenUp Clear, sodium chloride flush   *  Assessment/Plan    1. Cardiac arrest/VT/torsades on 8/15 - rhythm stable on IV amio - Keep K> 4.0 Mg > 2.0. K 4.2 today - EP following for ICD. I d/w them today. Likely ICD tomorrow  2. Acute systolic HF -> Cardiogenic shock due to acute anterior MI - s/p PCI/DES to LAD on 8/9 - Impella removed 8/12 - Volume status stable. No need for lasix - Continue digoxin 0.125  - Continue spiro to 25 mg daily.   - Stop Entresto with low BP. Add losartan 12.5 qhs - Continuelow dose carvedilol - Echo 20-25%  3. CAD with acute anterior MI 8/9 - s/p PCI/DES to LAD. 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 Acute hypoxic respiratory failure - in setting of #1 - extubated 8/14. Reintubated 8/15 in setting of torsades. Extubated 8/16.  -On room air.    4. DM2 - new diagnosis - cover with SSI  5. COPD - ongoing tobacco use - needs cessation   6. NSVT/Torsades - Has had > 10 gram load amio.  - Now on po amio 200 daily - Keep K> 4.0 Mg > 2.0 - EP appreciated. ICD tomorrow  7. Anemia, acute blood loss - Got 1u RBC on 8/15.  - Hgb stable today 9.3  8. Delirium  - resolved Continue Klonopin + seroquel.   PT/OT following. Recommending HH--> ordered. Considered CIR but no insurance.    Length of Stay: Follett, MD  04/14/2019, 9:50 AM  Advanced Heart Failure Team Pager [phone] (M-F; 7a - 4p)  Please contact [facility] for night-coverage after hours (4p -7a ) and weekends on [email or web address]

## 2019-04-14 NOTE — Plan of Care (Signed)
  Problem: Education: Goal: Knowledge of General Education information will improve Description: Including pain rating scale, medication(s)/side effects and non-pharmacologic comfort measures Outcome: Progressing   Problem: Health Behavior/Discharge Planning: Goal: Ability to manage health-related needs will improve Outcome: Progressing   Problem: Clinical Measurements: Goal: Ability to maintain clinical measurements within normal limits will improve Outcome: Progressing Goal: Will remain free from infection Outcome: Progressing Goal: Diagnostic test results will improve Outcome: Progressing Goal: Respiratory complications will improve Outcome: Progressing Goal: Cardiovascular complication will be avoided Outcome: Progressing   Problem: Activity: Goal: Risk for activity intolerance will decrease Outcome: Progressing   Problem: Nutrition: Goal: Adequate nutrition will be maintained Outcome: Progressing   Problem: Coping: Goal: Level of anxiety will decrease Outcome: Progressing   Problem: Elimination: Goal: Will not experience complications related to bowel motility Outcome: Progressing Goal: Will not experience complications related to urinary retention Outcome: Progressing   Problem: Pain Managment: Goal: General experience of comfort will improve Outcome: Progressing   Problem: Safety: Goal: Ability to remain free from injury will improve Outcome: Progressing   Problem: Skin Integrity: Goal: Risk for impaired skin integrity will decrease Outcome: Progressing   Problem: Education: Goal: Ability to demonstrate management of disease process will improve Outcome: Progressing Goal: Ability to verbalize understanding of medication therapies will improve Outcome: Progressing Goal: Individualized Educational Video(s) Outcome: Progressing   Problem: Activity: Goal: Capacity to carry out activities will improve Outcome: Progressing   Problem: Cardiac: Goal:  Ability to achieve and maintain adequate cardiopulmonary perfusion will improve Outcome: Progressing   Problem: Activity: Goal: Ability to tolerate increased activity will improve Outcome: Progressing   Problem: Respiratory: Goal: Ability to maintain a clear airway and adequate ventilation will improve Outcome: Progressing   Problem: Education: Goal: Understanding of cardiac disease, CV risk reduction, and recovery process will improve Outcome: Progressing Goal: Understanding of medication regimen will improve Outcome: Progressing Goal: Individualized Educational Video(s) Outcome: Progressing   Problem: Activity: Goal: Ability to tolerate increased activity will improve Outcome: Progressing   Problem: Cardiac: Goal: Ability to achieve and maintain adequate cardiopulmonary perfusion will improve Outcome: Progressing Goal: Vascular access site(s) Level 0-1 will be maintained Outcome: Progressing   Problem: Health Behavior/Discharge Planning: Goal: Ability to safely manage health-related needs after discharge will improve Outcome: Progressing   

## 2019-04-15 ENCOUNTER — Inpatient Hospital Stay (HOSPITAL_COMMUNITY)
Admission: EM | Disposition: A | Discharge status: Home Health | Payer: Self-pay | Source: Home / Self Care | Attending: Cardiovascular Disease

## 2019-04-15 ENCOUNTER — Encounter (HOSPITAL_COMMUNITY): Payer: Self-pay | Admitting: Cardiology

## 2019-04-15 DIAGNOSIS — I255 Ischemic cardiomyopathy: Secondary | ICD-10-CM

## 2019-04-15 DIAGNOSIS — I2510 Atherosclerotic heart disease of native coronary artery without angina pectoris: Secondary | ICD-10-CM

## 2019-04-15 DIAGNOSIS — I472 Ventricular tachycardia: Secondary | ICD-10-CM

## 2019-04-15 HISTORY — PX: ICD IMPLANT: EP1208

## 2019-04-15 LAB — CBC
HCT: 29.1 % — ABNORMAL LOW (ref 39.0–52.0)
Hemoglobin: 9.2 g/dL — ABNORMAL LOW (ref 13.0–17.0)
MCH: 29.6 pg (ref 26.0–34.0)
MCHC: 31.6 g/dL (ref 30.0–36.0)
MCV: 93.6 fL (ref 80.0–100.0)
Platelets: 668 10*3/uL — ABNORMAL HIGH (ref 150–400)
RBC: 3.11 MIL/uL — ABNORMAL LOW (ref 4.22–5.81)
RDW: 14.2 % (ref 11.5–15.5)
WBC: 7.9 10*3/uL (ref 4.0–10.5)
nRBC: 0 % (ref 0.0–0.2)

## 2019-04-15 LAB — BASIC METABOLIC PANEL
Anion gap: 8 (ref 5–15)
BUN: 17 mg/dL (ref 8–23)
CO2: 26 mmol/L (ref 22–32)
Calcium: 8.7 mg/dL — ABNORMAL LOW (ref 8.9–10.3)
Chloride: 105 mmol/L (ref 98–111)
Creatinine, Ser: 0.94 mg/dL (ref 0.61–1.24)
GFR calc Af Amer: >60 mL/min (ref >60)
GFR calc non Af Amer: >60 mL/min (ref >60)
Glucose, Bld: 95 mg/dL (ref 70–99)
Potassium: 4.3 mmol/L (ref 3.5–5.1)
Sodium: 139 mmol/L (ref 135–145)

## 2019-04-15 LAB — GLUCOSE, CAPILLARY
Glucose-Capillary: 83 mg/dL (ref 70–99)
Glucose-Capillary: 107 mg/dL — ABNORMAL HIGH (ref 70–99)
Glucose-Capillary: 128 mg/dL — ABNORMAL HIGH (ref 70–99)

## 2019-04-15 LAB — MAGNESIUM: Magnesium: 2 mg/dL (ref 1.7–2.4)

## 2019-04-15 LAB — LACTATE DEHYDROGENASE: LDH: 202 U/L — ABNORMAL HIGH (ref 98–192)

## 2019-04-15 SURGERY — ICD IMPLANT

## 2019-04-15 MED ORDER — LIDOCAINE HCL 1 % IJ SOLN
INTRAMUSCULAR | Status: AC
  Filled 2019-04-15: qty 60

## 2019-04-15 MED ORDER — MIDAZOLAM HCL 5 MG/5ML IJ SOLN
INTRAMUSCULAR | Status: DC | PRN
  Administered 2019-04-15 (×2): 1 mg via INTRAVENOUS

## 2019-04-15 MED ORDER — HEPARIN (PORCINE) IN NACL 1000-0.9 UT/500ML-% IV SOLN
INTRAVENOUS | Status: DC | PRN
  Administered 2019-04-15: 500 mL

## 2019-04-15 MED ORDER — CHLORHEXIDINE GLUCONATE 4 % EX LIQD
60.0000 mL | Freq: Once | CUTANEOUS | Status: DC
  Administered 2019-04-15: 4 via TOPICAL
  Filled 2019-04-15: qty 60

## 2019-04-15 MED ORDER — CEFAZOLIN SODIUM-DEXTROSE 2-4 GM/100ML-% IV SOLN
INTRAVENOUS | Status: AC
  Filled 2019-04-15: qty 100

## 2019-04-15 MED ORDER — HEPARIN (PORCINE) IN NACL 1000-0.9 UT/500ML-% IV SOLN
INTRAVENOUS | Status: AC
  Filled 2019-04-15: qty 500

## 2019-04-15 MED ORDER — MIDAZOLAM HCL 5 MG/5ML IJ SOLN
INTRAMUSCULAR | Status: AC
  Filled 2019-04-15: qty 5

## 2019-04-15 MED ORDER — CHLORHEXIDINE GLUCONATE 4 % EX LIQD: 60.0000 mL | Freq: Once | CUTANEOUS | Status: DC

## 2019-04-15 MED ORDER — SODIUM CHLORIDE 0.9 % IV SOLN: INTRAVENOUS | Status: DC

## 2019-04-15 MED ORDER — SODIUM CHLORIDE 0.9 % IV SOLN
80.0000 mg | INTRAVENOUS | Status: AC
  Administered 2019-04-15: 80 mg

## 2019-04-15 MED ORDER — CEFAZOLIN SODIUM-DEXTROSE 2-4 GM/100ML-% IV SOLN
2.0000 g | INTRAVENOUS | Status: AC
  Administered 2019-04-15: 12:00:00 2 g via INTRAVENOUS

## 2019-04-15 MED ORDER — FENTANYL CITRATE (PF) 100 MCG/2ML IJ SOLN
INTRAMUSCULAR | Status: DC | PRN
  Administered 2019-04-15 (×2): 25 ug via INTRAVENOUS

## 2019-04-15 MED ORDER — CEFAZOLIN SODIUM-DEXTROSE 1-4 GM/50ML-% IV SOLN
1.0000 g | Freq: Four times a day (QID) | INTRAVENOUS | Status: AC
  Administered 2019-04-15 – 2019-04-16 (×3): 1 g via INTRAVENOUS
  Filled 2019-04-15 (×3): qty 50

## 2019-04-15 MED ORDER — FENTANYL CITRATE (PF) 100 MCG/2ML IJ SOLN
INTRAMUSCULAR | Status: AC
  Filled 2019-04-15: qty 2

## 2019-04-15 MED ORDER — SODIUM CHLORIDE 0.9 % IV SOLN
INTRAVENOUS | Status: AC
  Filled 2019-04-15: qty 2

## 2019-04-15 MED ORDER — LOSARTAN POTASSIUM 25 MG PO TABS
12.5000 mg | ORAL_TABLET | Freq: Every day | ORAL | Status: DC
  Administered 2019-04-15: 12.5 mg via ORAL
  Filled 2019-04-15: qty 1

## 2019-04-15 MED ORDER — LIDOCAINE HCL (PF) 1 % IJ SOLN
INTRAMUSCULAR | Status: DC | PRN
  Administered 2019-04-15: 45 mL

## 2019-04-15 SURGICAL SUPPLY — 8 items
CABLE SURGICAL S-101-97-12 (CABLE) ×3 IMPLANT
ICD ELLIPSE DR CD2411-36Q (ICD Generator) ×2 IMPLANT
LEAD DURATA 7122Q-65CM (Lead) ×2 IMPLANT
LEAD TENDRIL MRI 52CM LPA1200M (Lead) ×2 IMPLANT
PAD PRO RADIOLUCENT 2001M-C (PAD) ×3 IMPLANT
SHEATH 7FR PRELUDE SNAP 13 (SHEATH) ×2 IMPLANT
SHEATH 8FR PRELUDE SNAP 13 (SHEATH) ×2 IMPLANT
TRAY PACEMAKER INSERTION (PACKS) ×3 IMPLANT

## 2019-04-15 NOTE — Progress Notes (Addendum)
Physical Therapy Treatment Patient Details Name: Stephen Jennings MRN: [id number] DOB: [birth year] Today's Date: 04/15/2019    History of Present Illness Pt is a 63 y/o M with no known PMHx who presented with respiratory distress via EMS, intubated in the ED 8/9.  Code STEMI was called and patient was taken to the cath lab with complete occlusion of the LAD with cardiogenic shock. Pt was extubated 8/14 but was reintubated on 8/15 when he suffered a cardiac arrest requiring defibrillation x2, 2 rounds of epi approximately 10 minutes CPR with ROSC. He was extubated 8/16.    PT Comments    Pt is improving well and moving away from need for AD in all situations.  Emphasis today on ease of transitions OOB, sit to stand, progressing gait stability and stamina without AD and stairs.   Follow Up Recommendations  Supervision/Assistance - 24 hour;No PT follow up;Other (comment)(maybe CRP2)     Equipment Recommendations  None recommended by PT;Other (comment)(TBA further)    Recommendations for Other Services       Precautions / Restrictions Precautions Precautions: Fall;ICD/Pacemaker Required Braces or Orthoses: Sling    Mobility  Bed Mobility         Supine to sit: Supervision Sit to supine: Supervision   General bed mobility comments: To EOB with momentum and little effort.  Discussed pacer precautions  Transfers Overall transfer level: Needs assistance Equipment used: None Transfers: Sit to/from Stand Sit to Stand: Supervision         General transfer comment: no assist needed, light use of right UE.  Discussed pacer precautions as it relates to sit to stand.  Ambulation/Gait Ambulation/Gait assistance: Supervision Gait Distance (Feet): 400 Feet Assistive device: None Gait Pattern/deviations: Step-through pattern     General Gait Details: generally steady, able to change speeds, scan, turn around and back up without deviation..  VSS, HR 80's and sats mid  90's   Stairs Stairs: Yes Stairs assistance: Supervision Stair Management: One rail Right;Alternating pattern;Forwards Number of Stairs: 2 General stair comments: safe with rail   Wheelchair Mobility    Modified Rankin (Stroke Patients Only)       Balance     Sitting balance-Leahy Scale: Good       Standing balance-Leahy Scale: Fair                              Cognition Arousal/Alertness: Awake/alert Behavior During Therapy: WFL for tasks assessed/performed Overall Cognitive Status: (NT formally, functional for the session)                             Awareness: Emergent          Exercises      General Comments General comments (skin integrity, edema, etc.): VSS through out      Pertinent Vitals/Pain Faces Pain Scale: No hurt    Home Living                      Prior Function            PT Goals (current goals can now be found in the care plan section) Acute Rehab PT Goals Patient Stated Goal: to go home PT Goal Formulation: With patient Time For Goal Achievement: 04/23/19 Potential to Achieve Goals: Good Progress towards PT goals: Progressing toward goals    Frequency    Min 3X/week  PT Plan Current plan remains appropriate    Co-evaluation              AM-PAC PT "6 Clicks" Mobility   Outcome Measure  Help needed turning from your back to your side while in a flat bed without using bedrails?: None Help needed moving from lying on your back to sitting on the side of a flat bed without using bedrails?: None Help needed moving to and from a bed to a chair (including a wheelchair)?: None Help needed standing up from a chair using your arms (e.g., wheelchair or bedside chair)?: None Help needed to walk in hospital room?: None Help needed climbing 3-5 steps with a railing? : None 6 Click Score: 24    End of Session   Activity Tolerance: Patient tolerated treatment well Patient left: in  bed;with call bell/phone within reach;with family/visitor present Nurse Communication: Mobility status PT Visit Diagnosis: Other abnormalities of gait and mobility (R26.89)     Time: [id number] PT Time Calculation (min) (ACUTE ONLY): 19 min  Charges:  $Gait Training: 8-22 mins                     04/15/2019  Donnella Sham, [place] [phone]  (pager) [phone]  (office)   Tessie Fass Stephen Jennings 04/15/2019, 5:04 PM

## 2019-04-15 NOTE — Discharge Summary (Addendum)
Advanced Heart Failure Team  Discharge Summary   Patient ID: Stephen Jennings MRN: [id number], DOB/AGE: 63-Mar-1957 63 y.o. Admit date: 03/31/2019 D/C date:     04/16/2019   Primary Discharge Diagnoses:  1. Cardiac arrest/VT/torsades on 8/15 2. Acute systolic HF -> Cardiogenic shock due to acute anterior MI 3. CAD with acute anterior MI 8/9 4. Acute hypoxic respiratory failure 5.  DM2 6. COPD 7. NSVT/Torsades 8.  Anemia, acute blood loss 9.  Delirium   Stephen Jennings is a 63 y/o male with h/o COPD. No known cardiac history. Brought to ER for respiratory failure/arrest. Intubated in ER. Initial ECG with anterior ST elevation. Intubated and transported to cath lab emergently. Cath by Dr. Claiborne Billings with 3v CAD and totally occluded prox LAD. LCX anomalous off RCA. Underwent PCI of LAD which was diffusely diseased vessel. Developed cardiogenic shock requiring pressors and mechanical support with impella. Hospital course complicated by Torsades. EP consulted placed ICD prior to discharge.   HF medications provided via HF fund. Plan to follow closely in the HF clinic. He will follow up at [facility].  Please see below for detailed problem list.  Hospital Course:  1. Cardiac arrest/VT/torsades on 8/15 - Loaded on amio . Rhythm stable on IV amio - EP placed ICD 8/24  2. Acute systolic HF -> Cardiogenic shock due to acute anterior MI - s/p PCI/DES to LAD on 8/9 - Required mechanical support with Impella, This was removed 04/03/19 - Continue digoxin 0.125  - Continue spiro to 25 mg daily.   - He did not tolerate Entresto due to low BP. Placed on losartan 12.5 qhs. BP improved - Continue low dose carvedilol. - 03/31/2019 Echo 20-25%  3. CAD with acute anterior MI 8/9 - s/p PCI/DES to LAD. 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 On Asa+ brillinta. Suspect he will not be able to afford brilliant anticipate switching due to cost.  Switch to  plavix 300 mg load then 75 mg daily.   4. Acute hypoxic respiratory failure - in setting of #1 - extubated 8/14. Reintubated 8/15 in setting of torsades. Extubated 8/16.  -As he improved he was stable on room air.   4. DM2 - new diagnosis - Diabetes Coordinators consulted. Placed on metformin 500 mg twice a day. He will follow up at [facility].  Hgb A1C 6.   5. COPD - ongoing tobacco use - needs cessation   6. NSVT/Torsades - Has had > 10 gram load amio.  - Now on po amio 200 daily. Continue amio 200 mg daily - Keep K> 4.0 Mg > 2.0 - EP appreciated.  -S/P ICD  7. Anemia, acute blood loss - Got 1u RBC on 8/15.   - Hgb stable the day discharge.    8. Delirium  - resolved with brief Klonopin + seroquel. -   Discharge Weight: 175 pounds  Discharge Vitals: Blood pressure 115/63, pulse 63, temperature (!) 97.4 F (36.3 C), temperature source Oral, resp. rate (!) 24, height 5' 9.02" (1.753 m), weight 78.9 kg, SpO2 94 %.  Labs: Lab Results  Component Value Date   WBC 7.7 04/16/2019   HGB 10.0 (L) 04/16/2019   HCT 30.9 (L) 04/16/2019   MCV 94.2 04/16/2019   PLT 670 (H) 04/16/2019    Recent Labs  Lab 04/16/19 0202  NA 138  K 4.1  CL 104  CO2 23  BUN 17  CREATININE 0.97  CALCIUM  8.6*  GLUCOSE 104*   Lab Results  Component Value Date   CHOL 253 (H) 03/31/2019   HDL 46 03/31/2019   LDLCALC 188 (H) 03/31/2019   TRIG 99 04/12/2019   BNP (last 3 results) Recent Labs    03/31/19 0013  BNP 1,247.4*    ProBNP (last 3 results) No results for input(s): PROBNP in the last 8760 hours.   Diagnostic Studies/Procedures   No results found.  Discharge Medications   Allergies as of 04/16/2019   No Known Allergies     Medication List    TAKE these medications   amiodarone 200 MG tablet Commonly known as: PACERONE Take 1 tablet (200 mg total) by mouth daily. Start taking on: April 17, 2019   aspirin 81 MG chewable  tablet Chew 1 tablet (81 mg total) by mouth daily.   atorvastatin 80 MG tablet Commonly known as: LIPITOR Take 1 tablet (80 mg total) by mouth daily at 6 PM.   carvedilol 3.125 MG tablet Commonly known as: COREG Take 1 tablet (3.125 mg total) by mouth 2 (two) times daily with a meal.   CLEAR EYES OP Place 1 drop into both eyes daily as needed (dry eyes/ irritation).   digoxin 0.125 MG tablet Commonly known as: LANOXIN Take 1 tablet (0.125 mg total) by mouth daily.   losartan 25 MG tablet Commonly known as: COZAAR Take 0.5 tablets (12.5 mg total) by mouth at bedtime.   metFORMIN 500 MG tablet Commonly known as: GLUCOPHAGE Take 1 tablet (500 mg total) by mouth 2 (two) times daily with a meal.   nitroGLYCERIN 0.4 MG SL tablet Commonly known as: NITROSTAT Place 1 tablet (0.4 mg total) under the tongue every 5 (five) minutes x 3 doses as needed for chest pain.   spironolactone 25 MG tablet Commonly known as: ALDACTONE Take 1 tablet (25 mg total) by mouth daily. Start taking on: April 17, 2019   ticagrelor 90 MG Tabs tablet Commonly known as: BRILINTA Take 1 tablet (90 mg total) by mouth 2 (two) times daily.            Durable Medical Equipment  (From admission, onward)         Start     Ordered   04/10/19 1541  For home use only DME Bedside commode  Once    Comments: 3N1  Question:  Patient needs a bedside commode to treat with the following condition  Answer:  Physical deconditioning   04/10/19 1540   04/10/19 1541  Heart failure home health orders  (Heart failure home health orders / Face to face)  Once    Comments: Heart Failure Follow-up Care:  Verify follow-up appointments per Patient Discharge Instructions. Confirm transportation arranged. Reconcile home medications with discharge medication list. Remove discontinued medications from use. Assist patient/caregiver to manage medications using pill box. Reinforce low sodium food selection Assessments: Vital  signs and oxygen saturation at each visit. Assess home environment for safety concerns, caregiver support and availability of low-sodium foods. Consult Education officer, museum, PT/OT, Dietitian, and CNA based on assessments. Perform comprehensive cardiopulmonary assessment. Notify MD for any change in condition or weight gain of 3 pounds in one day or 5 pounds in one week with symptoms. Daily Weights and Symptom Monitoring: Ensure patient has access to scales. Teach patient/caregiver to weigh daily before breakfast and after voiding using same scale and record.    Teach patient/caregiver to track weight and symptoms and when to notify Provider. Activity: Develop individualized activity plan with  patient/caregiver.  HHRN 2 wk 2 for CP assessment    PT at [facility] add order:PT/OT 2 wk 2 for CP Rehab.  Question Answer Comment  Heart Failure Follow-up Care Advanced Heart Failure (AHF) Clinic at [phone]   Obtain the following labs Basic Metabolic Panel   Lab frequency Other see comments   Fax lab results to Other see comments   Diet Low Sodium Heart Healthy   Fluid restrictions: 2000 mL Fluid      04/10/19 1541   04/10/19 1519  For home use only DME Walker rolling  Once    Comments: Rolling walker with 5" wheels  Question:  Patient needs a walker to treat with the following condition  Answer:  Physical deconditioning   04/10/19 1519          Disposition   The patient will be discharged in stable condition to home. Discharge Instructions    Amb Referral to Cardiac Rehabilitation   Complete by: As directed    Diagnosis:  Coronary Stents STEMI     After initial evaluation and assessments completed: Virtual Based Care may be provided alone or in conjunction with Phase 2 Cardiac Rehab based on patient barriers.: Yes   Diet - low sodium heart healthy   Complete by: As directed    Heart Failure patients record your daily weight using the same scale at the same time of day   Complete by: As  directed    Increase activity slowly   Complete by: As directed      [facility] Follow up on 05/08/2019.   Why: hospital follow up at 10:30 Contact information: [place] [phone]       [facility] Follow up on 04/25/2019.   Specialty: Cardiology Why: at [place] information: [address] [id number] [place] [id number] [phone]       Baldwin [place], [name] Follow up.   Specialty: Cardiology Contact information: [address] [place] [address] [phone]             Duration of Discharge Encounter: Greater than 35 minutes   Signed, Stephen [place] Np-C  04/16/2019, 9:02 AM

## 2019-04-15 NOTE — H&P (Signed)
ICD Criteria  Current LVEF:20-25%. Within 12 months prior to implant: Yes   Heart failure history: Yes, Class II  Cardiomyopathy history: Yes, Ischemic Cardiomyopathy - Prior MI.  Atrial Fibrillation/Atrial Flutter: No.  Ventricular tachycardia history: Yes, Hemodynamic instability present. 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  Cardiac arrest history: Yes, Ventricular Tachycardia.  History of syndromes with risk of sudden death: No.  Previous ICD: No.  Current ICD indication: Secondary  PPM indication: No.  Class I or II Bradycardia indication present: Yes  Beta Blocker therapy for 3 or more months: No, medical reason.  Ace Inhibitor/ARB therapy for 3 or more months: No, medical reason.   I have seen Stephen Jennings is a 63 y.o. malepre-procedural and has been referred by [facility] for consideration of ICD implant for secondary prevention of sudden death.  The patient's chart has been reviewed and they meet criteria for ICD implant.  I have had a thorough discussion with the patient reviewing options.  The patient and their family (if available) have had opportunities to ask questions and have them answered. The patient and I have decided together through the [employer] to implant ICD at this time.  Risks, benefits, alternatives to ICD implantation were discussed in detail with the patient today. The patient  understands that the risks include but are not limited to bleeding, infection, pneumothorax, perforation, tamponade, vascular damage, renal failure, MI, stroke, death, inappropriate shocks, and lead dislodgement and  wishes to proceed.

## 2019-04-15 NOTE — Progress Notes (Signed)
Occupational Therapy Treatment Patient Details Name: Stephen Jennings MRN: [id number] DOB: Jun 21, 1956 Today's Date: 04/15/2019    History of present illness Pt is a 63 y/o M with no known PMHx who presented with respiratory distress via EMS, intubated in the ED 8/9.  Code STEMI was called and patient was taken to the cath lab with complete occlusion of the LAD with cardiogenic shock. Pt was extubated 8/14 but was reintubated on 8/15 when he suffered a cardiac arrest requiring defibrillation x2, 2 rounds of epi approximately 10 minutes CPR with ROSC. He was extubated 8/16. S/P ICD placement 8/24.    OT comments  Patient progressing well.  Educated on pacemaker precautions, sling, safety and ADl compensatory techniques. Requires min assist for UB /LB ADls, supervision for transfers.  Verbalized understanding with precautions.  Reports he will have 24/7 support.  Continue to recommend HHOT in order to maximize safety and pacemaker precaution adherence during functional tasks in order to return to PLOF.    Follow Up Recommendations  Home health OT;Supervision/Assistance - 24 hour    Equipment Recommendations  3 in 1 bedside commode    Recommendations for Other Services      Precautions / Restrictions Precautions Precautions: Fall;ICD/Pacemaker Required Braces or Orthoses: Sling Restrictions Weight Bearing Restrictions: No       Mobility Bed Mobility Overal bed mobility: Needs Assistance Bed Mobility: Supine to Sit;Sit to Supine     Supine to sit: Min assist Sit to supine: Supervision   General bed mobility comments: reaching for R UE support to ascend trunk to EOB, returned to supine with supervision   Transfers Overall transfer level: Needs assistance Equipment used: None Transfers: Sit to/from Stand Sit to Stand: Supervision         General transfer comment: supervision for safety, cueing for R UE placement     Balance Overall balance assessment: Needs  assistance Sitting-balance support: No upper extremity supported;Feet supported Sitting balance-Leahy Scale: Good     Standing balance support: Single extremity supported;During functional activity Standing balance-Leahy Scale: Fair Standing balance comment: preference to UE support                           ADL either performed or assessed with clinical judgement   ADL Overall ADL's : Needs assistance/impaired     Grooming: Standing;Wash/dry hands;Supervision/safety           Upper Body Dressing : Minimal assistance;Sitting Upper Body Dressing Details (indicate cue type and reason): s/p ICD with limited L UE ROM and sling  Lower Body Dressing: Minimal assistance;Sit to/from stand Lower Body Dressing Details (indicate cue type and reason): limited functional use of L UE, assist with starting socks in sitting  Toilet Transfer: Supervision/safety;Ambulation       Tub/ Shower Transfer: Walk-in shower;Supervision/safety;3 in Risk manager Details (indicate cue type and reason): simulated in room, using 3:1 for seat Functional mobility during ADLs: Cueing for safety       Vision   Vision Assessment?: No apparent visual deficits   Perception     Praxis      Cognition Arousal/Alertness: Awake/alert Behavior During Therapy: WFL for tasks assessed/performed Overall Cognitive Status: Impaired/Different from baseline Area of Impairment: Problem solving;Awareness                           Awareness: Emergent Problem Solving: Difficulty sequencing;Requires verbal cues  Exercises     Shoulder Instructions       General Comments VSS; reviewed pacer precautions and sling     Pertinent Vitals/ Pain       Pain Assessment: No/denies pain Faces Pain Scale: No hurt  Home Living                                          Prior Functioning/Environment              Frequency  Min 2X/week         Progress Toward Goals  OT Goals(current goals can now be found in the care plan section)  Progress towards OT goals: Progressing toward goals  Acute Rehab OT Goals Patient Stated Goal: to go home OT Goal Formulation: With patient  Plan Discharge plan remains appropriate;Frequency remains appropriate    Co-evaluation                 AM-PAC OT "6 Clicks" Daily Activity     Outcome Measure   Help from another person eating meals?: None Help from another person taking care of personal grooming?: A Little Help from another person toileting, which includes using toliet, bedpan, or urinal?: A Little Help from another person bathing (including washing, rinsing, drying)?: A Little Help from another person to put on and taking off regular upper body clothing?: A Little Help from another person to put on and taking off regular lower body clothing?: A Little 6 Click Score: 19    End of Session Equipment Utilized During Treatment: Other (comment)(sling)  OT Visit Diagnosis: Other abnormalities of gait and mobility (R26.89);Other symptoms and signs involving cognitive function   Activity Tolerance Patient tolerated treatment well   Patient Left in bed;with call bell/phone within reach;with bed alarm set   Nurse Communication Mobility status;Other (comment)(pacer precautions )        Time: [id number] OT Time Calculation (min): 20 min  Charges: OT General Charges $OT Visit: 1 Visit OT Treatments $Self Care/Home Management : 8-22 mins  Delight Stare, OT Acute Rehabilitation Services Pager [phone] Office [phone]    Delight Stare 04/15/2019, 5:15 PM

## 2019-04-15 NOTE — Progress Notes (Signed)
Advanced Heart Failure Rounding Note  PCP-Cardiologist: No primary care provider on file.   Subjective:    Extubated 8/14. Had torsades 8/15 requiring shock and re-intubation. Extubated 8/16. Milrinone stopped 8/19.   Feels good this am. Anxious to go home. BP improved today after switching entresto to low-dose losartan.   Denies CP or SOB. Rhythm stable.   Objective:   Weight Range: 79.6 kg Body mass index is 25.9 kg/m.   Vital Signs:   Temp:  [97.4 F (36.3 C)-98.3 F (36.8 C)] 98.3 F (36.8 C) (08/24 0817) Pulse Rate:  [56-60] 57 (08/24 0817) Resp:  [18-21] 19 (08/24 0817) BP: (89-127)/(47-67) 127/67 (08/24 0817) SpO2:  [96 %-100 %] 100 % (08/24 0817) Weight:  [79.6 kg] 79.6 kg (08/24 0300) Last BM Date: 04/13/19  Weight change: Filed Weights   04/13/19 0250 04/14/19 0500 04/15/19 0300  Weight: 80.9 kg 79.6 kg 79.6 kg    Intake/Output:   Intake/Output Summary (Last 24 hours) at 04/15/2019 0950 Last data filed at 04/15/2019 0445 Gross per 24 hour  Intake 250 ml  Output 1600 ml  Net -1350 ml      Physical Exam   General:  Well appearing. No resp difficulty HEENT: normal Neck: supple. no JVD. Carotids 2+ bilat; no bruits. No lymphadenopathy or thryomegaly appreciated. Cor: PMI nondisplaced. Regular rate & rhythm. No rubs, gallops or murmurs. Lungs: clear decreased BS throguhout Abdomen: soft, nontender, nondistended. No hepatosplenomegaly. No bruits or masses. Good bowel sounds. Extremities: no cyanosis, clubbing, rash, edema Neuro: alert & orientedx3, cranial nerves grossly intact. moves all 4 extremities w/o difficulty. Affect pleasant  Telemetry   NSR 50-60s  Personally reviewed.    EKG    N/a   Labs    CBC Recent Labs    04/14/19 0218 04/15/19 0223  WBC 7.7 7.9  HGB 9.3* 9.2*  HCT 30.1* 29.1*  MCV 94.7 93.6  PLT 672* [id number]*   Basic Metabolic Panel Recent Labs    04/14/19 0218 04/15/19 0223  NA 140 139  K 4.1 4.3  CL 106 105   CO2 25 26  GLUCOSE 78 95  BUN 16 17  CREATININE 0.95 0.94  CALCIUM 8.6* 8.7*  MG 1.9 2.0   Liver Function Tests No results for input(s): AST, ALT, ALKPHOS, BILITOT, PROT, ALBUMIN in the last 72 hours. No results for input(s): LIPASE, AMYLASE in the last 72 hours. Cardiac Enzymes No results for input(s): CKTOTAL, CKMB, CKMBINDEX, TROPONINI in the last 72 hours.  BNP: BNP (last 3 results) Recent Labs    03/31/19 0013  BNP 1,247.4*    ProBNP (last 3 results) No results for input(s): PROBNP in the last 8760 hours.   D-Dimer No results for input(s): DDIMER in the last 72 hours. Hemoglobin A1C No results for input(s): HGBA1C in the last 72 hours. Fasting Lipid Panel No results for input(s): CHOL, HDL, LDLCALC, TRIG, CHOLHDL, LDLDIRECT in the last 72 hours. Thyroid Function Tests No results for input(s): TSH, T4TOTAL, T3FREE, THYROIDAB in the last 72 hours.  Invalid input(s): FREET3  Other results:   Imaging    No results found.   Medications:     Scheduled Medications: . sodium chloride   Intravenous Once  . amiodarone  200 mg Oral Daily  . aspirin  81 mg Oral Daily  . atorvastatin  80 mg Oral q1800  . carvedilol  3.125 mg Oral BID WC  . chlorhexidine  60 mL Topical Once  . chlorhexidine  15 mL Mouth  Rinse BID  . Chlorhexidine Gluconate Cloth  6 each Topical Daily  . digoxin  0.125 mg Oral Daily  . feeding supplement (ENSURE ENLIVE)  237 mL Oral BID BM  . gentamicin irrigation  80 mg Irrigation To Cath  . heparin injection (subcutaneous)  5,000 Units Subcutaneous Q8H  . insulin aspart  0-9 Units Subcutaneous Q4H  . insulin glargine  15 Units Subcutaneous Daily  . mouth rinse  15 mL Mouth Rinse q12n4p  . pantoprazole  40 mg Oral Daily  . QUEtiapine  25 mg Oral BID  . sacubitril-valsartan  1 tablet Oral BID  . sodium chloride flush  10-40 mL Intracatheter Q12H  . sorbitol  30 mL Per Tube Once  . spironolactone  25 mg Oral Daily  . Thrombi-Pad  1 each  Topical Once  . ticagrelor  90 mg Oral BID    Infusions: . sodium chloride Stopped (04/06/19 0930)  . sodium chloride    .  ceFAZolin (ANCEF) IV      PRN Medications: acetaminophen, clonazepam, ipratropium-albuterol, nitroGLYCERIN, ondansetron (ZOFRAN) IV, Resource ThickenUp Clear, sodium chloride flush   *  Assessment/Plan    1. Cardiac arrest/VT/torsades on 8/15 - rhythm stable on IV amio - Keep K> 4.0 Mg > 2.0. K 4.2 today - EP following for ICD. I d/w Dr. Curt Bears. ICD later today with atrial lead.   2. Acute systolic HF -> Cardiogenic shock due to acute anterior MI - s/p PCI/DES to LAD on 8/9 - Impella removed 8/12 - Volume status stable. No need for lasix - Continue digoxin 0.125  - Continue spiro to 25 mg daily.   - Entresto with low BP. No on losartan 12.5 qhs. BP improved - Continue low dose carvedilol - Echo 20-25%  3. CAD with acute anterior MI 8/9 - s/p PCI/DES to LAD. 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 Acute hypoxic respiratory failure - in setting of #1 - extubated 8/14. Reintubated 8/15 in setting of torsades. Extubated 8/16.  -On room air.    4. DM2 - new diagnosis - cover with SSI  5. COPD - ongoing tobacco use - needs cessation   6. NSVT/Torsades - Has had > 10 gram load amio.  - Now on po amio 200 daily - Keep K> 4.0 Mg > 2.0 - EP appreciated. ICD today  7. Anemia, acute blood loss - Got 1u RBC on 8/15.  - Hgb stable today 9.2  8. Delirium  - resolved Continue Klonopin + seroquel.  Home in am after ICD today.   Length of Stay: [place], MD  04/15/2019, 9:50 AM  Advanced Heart Failure Team Pager [phone] (M-F; 7a - 4p)  Please contact [facility] for night-coverage after hours (4p -7a ) and weekends on [email or web address]

## 2019-04-15 NOTE — Progress Notes (Addendum)
Electrophysiology Rounding Note  Patient Name: Stephen Jennings Date of Encounter: 04/15/2019  Primary Cardiologist: [place] Electrophysiologist: Curt Bears   Subjective   The patient is doing well today.  At this time, the patient denies chest pain, shortness of breath, or any new concerns.  He is anxious to go home.   Inpatient Medications    Scheduled Meds: . sodium chloride   Intravenous Once  . amiodarone  200 mg Oral Daily  . aspirin  81 mg Oral Daily  . atorvastatin  80 mg Oral q1800  . carvedilol  3.125 mg Oral BID WC  . chlorhexidine  15 mL Mouth Rinse BID  . Chlorhexidine Gluconate Cloth  6 each Topical Daily  . digoxin  0.125 mg Oral Daily  . feeding supplement (ENSURE ENLIVE)  237 mL Oral BID BM  . heparin injection (subcutaneous)  5,000 Units Subcutaneous Q8H  . insulin aspart  0-9 Units Subcutaneous Q4H  . insulin glargine  15 Units Subcutaneous Daily  . mouth rinse  15 mL Mouth Rinse q12n4p  . pantoprazole  40 mg Oral Daily  . QUEtiapine  25 mg Oral BID  . sacubitril-valsartan  1 tablet Oral BID  . sodium chloride flush  10-40 mL Intracatheter Q12H  . sorbitol  30 mL Per Tube Once  . spironolactone  25 mg Oral Daily  . Thrombi-Pad  1 each Topical Once  . ticagrelor  90 mg Oral BID   Continuous Infusions: . sodium chloride Stopped (04/06/19 0930)   PRN Meds: acetaminophen, clonazepam, ipratropium-albuterol, nitroGLYCERIN, ondansetron (ZOFRAN) IV, Resource ThickenUp Clear, sodium chloride flush   Vital Signs    Vitals:   04/14/19 1539 04/14/19 1714 04/14/19 2013 04/15/19 0300  BP: (!) 100/58 (!) 109/53 (!) 96/55 (!) 106/47  Pulse: 60 (!) 56 (!) 56 (!) 59  Resp:   18 18  Temp: 97.9 F (36.6 C)  97.6 F (36.4 C) 98.3 F (36.8 C)  TempSrc: Oral  Oral Oral  SpO2: 97%  96% 99%  Weight:    79.6 kg  Height:        Intake/Output Summary (Last 24 hours) at 04/15/2019 0744 Last data filed at 04/15/2019 0445 Gross per 24 hour  Intake 250 ml  Output 1600  ml  Net -1350 ml   Filed Weights   04/13/19 0250 04/14/19 0500 04/15/19 0300  Weight: 80.9 kg 79.6 kg 79.6 kg    Physical Exam    GEN- The patient is elderly appearing, alert and oriented x 3 today.   Head- normocephalic, atraumatic Eyes-  Sclera clear, conjunctiva pink Ears- hearing intact Oropharynx- clear Neck- supple Lungs- Clear to ausculation bilaterally, normal work of breathing Heart- Regular rate and rhythm  GI- soft, NT, ND, + BS Extremities- no clubbing, cyanosis, or edema Skin- no rash or lesion Psych- euthymic mood, full affect Neuro- strength and sensation are intact  Labs    CBC Recent Labs    04/14/19 0218 04/15/19 0223  WBC 7.7 7.9  HGB 9.3* 9.2*  HCT 30.1* 29.1*  MCV 94.7 93.6  PLT 672* [id number]*   Basic Metabolic Panel Recent Labs    04/14/19 0218 04/15/19 0223  NA 140 139  K 4.1 4.3  CL 106 105  CO2 25 26  GLUCOSE 78 95  BUN 16 17  CREATININE 0.95 0.94  CALCIUM 8.6* 8.7*  MG 1.9 2.0     Telemetry    Sinus rhythm/sinus brady  (personally reviewed)  Radiology    No results found.  Assessment & Plan    1.  Cardiac arrest/torsades Plan for ICD implant today Risks, benefits reviewed with patient who wishes to proceed Keep K>3.9, Mg >1.8 No driving x6 months  2.  CAD  S/p DES to LAD Continue medical therapy  3.  Acute systolic heart failure Per AHF team   For questions or updates, please contact [place] Please consult [email or web address] for contact info under Cardiology/STEMI.  Signed, Chanetta Marshall, NP  04/15/2019, 7:44 AM   I have seen and examined this patient with Chanetta Marshall.  Agree with above, note added to reflect my findings.  On exam, RRR, no murmurs, lungs clear. Patient with cardiac arrest post stemi outside of 48 hour window. [place] plan for ICD implant today.    Ruey Storer M. Eidan Muellner MD 04/15/2019 9:24 AM

## 2019-04-15 NOTE — Progress Notes (Signed)
CARDIAC REHAB PHASE I   PRE:  Rate/Rhythm: 59 SB  BP:  Supine:   Sitting: 127/67  Standing:    SaO2: 95%RA  MODE:  Ambulation: 490 ft   POST:  Rate/Rhythm: 78 SR  BP:  Supine:   Sitting: 127/65  Standing:    SaO2: 97%RA 0830-0850 Pt doing much better. Ambulated 490 ft on RA with gait belt use, rolling walker and asst x 2. Can be asst x 1. Cooperative. Wants to go home. To recliner after walk. No CP. Will educate prior to discharge.   Graylon Good, RN BSN  04/15/2019 8:46 AM

## 2019-04-16 ENCOUNTER — Inpatient Hospital Stay (HOSPITAL_COMMUNITY): Payer: Self-pay

## 2019-04-16 DIAGNOSIS — I2109 ST elevation (STEMI) myocardial infarction involving other coronary artery of anterior wall: Secondary | ICD-10-CM

## 2019-04-16 LAB — BASIC METABOLIC PANEL
Anion gap: 11 (ref 5–15)
BUN: 17 mg/dL (ref 8–23)
CO2: 23 mmol/L (ref 22–32)
Calcium: 8.6 mg/dL — ABNORMAL LOW (ref 8.9–10.3)
Chloride: 104 mmol/L (ref 98–111)
Creatinine, Ser: 0.97 mg/dL (ref 0.61–1.24)
GFR calc Af Amer: >60 mL/min (ref >60)
GFR calc non Af Amer: >60 mL/min (ref >60)
Glucose, Bld: 104 mg/dL — ABNORMAL HIGH (ref 70–99)
Potassium: 4.1 mmol/L (ref 3.5–5.1)
Sodium: 138 mmol/L (ref 135–145)

## 2019-04-16 LAB — CBC
HCT: 30.9 % — ABNORMAL LOW (ref 39.0–52.0)
Hemoglobin: 10 g/dL — ABNORMAL LOW (ref 13.0–17.0)
MCH: 30.5 pg (ref 26.0–34.0)
MCHC: 32.4 g/dL (ref 30.0–36.0)
MCV: 94.2 fL (ref 80.0–100.0)
Platelets: 670 10*3/uL — ABNORMAL HIGH (ref 150–400)
RBC: 3.28 MIL/uL — ABNORMAL LOW (ref 4.22–5.81)
RDW: 14.2 % (ref 11.5–15.5)
WBC: 7.7 10*3/uL (ref 4.0–10.5)
nRBC: 0 % (ref 0.0–0.2)

## 2019-04-16 LAB — GLUCOSE, CAPILLARY
Glucose-Capillary: 107 mg/dL — ABNORMAL HIGH (ref 70–99)
Glucose-Capillary: 98 mg/dL (ref 70–99)

## 2019-04-16 LAB — MAGNESIUM: Magnesium: 1.8 mg/dL (ref 1.7–2.4)

## 2019-04-16 LAB — LACTATE DEHYDROGENASE: LDH: 254 U/L — ABNORMAL HIGH (ref 98–192)

## 2019-04-16 MED ORDER — NITROGLYCERIN 0.4 MG SL SUBL: 0.4000 mg | SUBLINGUAL_TABLET | SUBLINGUAL | 12 refills | Status: DC | PRN

## 2019-04-16 MED ORDER — METFORMIN HCL 500 MG PO TABS: 500.0000 mg | ORAL_TABLET | Freq: Two times a day (BID) | ORAL | 6 refills | Status: DC

## 2019-04-16 MED ORDER — ATORVASTATIN CALCIUM 80 MG PO TABS: 80.0000 mg | ORAL_TABLET | Freq: Every day | ORAL | 6 refills | Status: DC

## 2019-04-16 MED ORDER — TICAGRELOR 90 MG PO TABS: 90.0000 mg | ORAL_TABLET | Freq: Two times a day (BID) | ORAL | 6 refills | Status: DC

## 2019-04-16 MED ORDER — ASPIRIN 81 MG PO CHEW: 81.0000 mg | CHEWABLE_TABLET | Freq: Every day | ORAL | 6 refills | Status: DC

## 2019-04-16 MED ORDER — AMIODARONE HCL 200 MG PO TABS: 200.0000 mg | ORAL_TABLET | Freq: Every day | ORAL | 6 refills | Status: DC

## 2019-04-16 MED ORDER — DIGOXIN 125 MCG PO TABS: 0.1250 mg | ORAL_TABLET | Freq: Every day | ORAL | 6 refills | Status: DC

## 2019-04-16 MED ORDER — MAGNESIUM SULFATE 2 GM/50ML IV SOLN
2.0000 g | Freq: Once | INTRAVENOUS | Status: AC
  Administered 2019-04-16: 10:00:00 2 g via INTRAVENOUS
  Filled 2019-04-16: qty 50

## 2019-04-16 MED ORDER — CARVEDILOL 3.125 MG PO TABS: 3.1250 mg | ORAL_TABLET | Freq: Two times a day (BID) | ORAL | 6 refills | Status: DC

## 2019-04-16 MED ORDER — LOSARTAN POTASSIUM 25 MG PO TABS: 12.5000 mg | ORAL_TABLET | Freq: Every day | ORAL | 6 refills | Status: DC

## 2019-04-16 MED ORDER — METFORMIN HCL 500 MG PO TABS: 500.0000 mg | ORAL_TABLET | Freq: Two times a day (BID) | ORAL | Status: DC

## 2019-04-16 MED ORDER — SPIRONOLACTONE 25 MG PO TABS: 25.0000 mg | ORAL_TABLET | Freq: Every day | ORAL | 6 refills | Status: DC

## 2019-04-16 MED FILL — Lidocaine HCl Local Inj 1%: INTRAMUSCULAR | Qty: 60 | Status: AC

## 2019-04-16 MED FILL — metFORMIN HCL 500 MG TABS: 500 | 30 days supply | Qty: 60 | Fill #0

## 2019-04-16 MED FILL — CARVEDILOL 3.125 MG TABLET: 3.125 | 30 days supply | Qty: 60 | Fill #0

## 2019-04-16 MED FILL — NITROGLYCERIN 0.4 MG TAB SL: 0.4 | 8 days supply | Qty: 25 | Fill #0

## 2019-04-16 MED FILL — DIGOXIN 0.125 MG TABLET: 125 | 30 days supply | Qty: 30 | Fill #0

## 2019-04-16 MED FILL — AMIODARONE HCL 200 MG TAB: 200 | 30 days supply | Qty: 30 | Fill #0

## 2019-04-16 MED FILL — LOSARTAN POTASSIUM 25 MG TA: 25 | 30 days supply | Qty: 15 | Fill #0

## 2019-04-16 MED FILL — BRILINTA 90 MG TABLET: 90 | 30 days supply | Qty: 60 | Fill #0

## 2019-04-16 MED FILL — ATORVASTATIN CALCIUM 80 MG: 80 | 30 days supply | Qty: 30 | Fill #0

## 2019-04-16 MED FILL — SPIRONOLACTONE 25 MG TABLET: 25 | 30 days supply | Qty: 30 | Fill #0

## 2019-04-16 MED FILL — ASPIRIN LOW DOSE 81 MG CHEW: 81 | 30 days supply | Qty: 30 | Fill #0

## 2019-04-16 NOTE — TOC Transition Note (Addendum)
Transition of Care [facility]) - CM/SW Discharge Note   Patient Details  Name: Stephen Jennings MRN: [id number] Date of Birth: Nov 01, 1955  Transition of Care [facility] CM/SW Contact:  Zenon Mayo, RN Phone Number: 04/16/2019, 10:07 AM   Clinical Narrative:    Patient  Is for dc today, he will need ast with medications, NCM ast with [employer] and will give him the patient ast application for briinta.  He will also need rolling walker thru charity and [facility] thru [place],  Patient stastes he is ok with [facility] who is doing charity this week.  Dorian Pod with [facility] notified , awaiting a call back.  Dorian Pod called back she states she will check to see if this patient qualifies for charity.  Per Dorian Pod, he does qualify for charity.   Final next level of care: Home/Self Care Barriers to Discharge: No Barriers Identified   Patient Goals and CMS Choice Patient states their goals for this hospitalization and ongoing recovery are:: get better CMS Medicare.gov Compare Post Acute Care list provided to:: Patient Choice offered to / list presented to : Patient  Discharge Placement                       Discharge Plan and Services                DME Arranged: Walker rolling DME Agency: [employer] Date DME Agency Contacted: 04/16/19 Time DME Agency Contacted: [id number] Representative spoke with at DME Agency: zack HH Arranged: RN [place] Agency: [facility] [place] Date [place]: 04/16/19 Time [place]: 1007 Representative spoke with at [place]: [place] ([place]) Interventions     Readmission Risk Interventions No flowsheet data found.

## 2019-04-16 NOTE — Progress Notes (Addendum)
Advanced Heart Failure Rounding Note  PCP-Cardiologist: No primary care provider on file.   Subjective:    S/P ICD St Jude .   Wants to go home. Denies SOB.   Objective:   Weight Range: 78.9 kg Body mass index is 25.68 kg/m.   Vital Signs:   Temp:  [97.4 F (36.3 C)-97.9 F (36.6 C)] 97.4 F (36.3 C) (08/25 0336) Pulse Rate:  [53-153] 63 (08/25 0756) Resp:  [16-30] 24 (08/25 0400) BP: (90-116)/(52-84) 115/63 (08/25 0400) SpO2:  [0 %-100 %] 94 % (08/25 0400) Weight:  [78.9 kg] 78.9 kg (08/25 0336) Last BM Date: 04/13/19  Weight change: Filed Weights   04/14/19 0500 04/15/19 0300 04/16/19 0336  Weight: 79.6 kg 79.6 kg 78.9 kg    Intake/Output:   Intake/Output Summary (Last 24 hours) at 04/16/2019 0841 Last data filed at 04/16/2019 0400 Gross per 24 hour  Intake 50 ml  Output 400 ml  Net -350 ml      Physical Exam   General:  Well appearing. No resp difficulty HEENT: normal Neck: supple. no JVD. Carotids 2+ bilat; no bruits. No lymphadenopathy or thryomegaly appreciated. Cor: PMI nondisplaced. Regular rate & rhythm. No rubs, gallops or murmurs. L upper chest steri strips.  Lungs: clear Abdomen: soft, nontender, nondistended. No hepatosplenomegaly. No bruits or masses. Good bowel sounds. Extremities: no cyanosis, clubbing, rash, edema Neuro: alert & orientedx3, cranial nerves grossly intact. moves all 4 extremities w/o difficulty. Affect pleasant  Telemetry   NSR 60-70s   EKG    N/a   Labs    CBC Recent Labs    04/15/19 0223 04/16/19 0202  WBC 7.9 7.7  HGB 9.2* 10.0*  HCT 29.1* 30.9*  MCV 93.6 94.2  PLT 668* [id number]   Basic Metabolic Panel Recent Labs    04/15/19 0223 04/16/19 0202  NA 139 138  K 4.3 4.1  CL 105 104  CO2 26 23  GLUCOSE 95 104*  BUN 17 17  CREATININE 0.94 0.97  CALCIUM 8.7* 8.6*  MG 2.0 1.8   Liver Function Tests No results for input(s): AST, ALT, ALKPHOS, BILITOT, PROT, ALBUMIN in the last 72 hours. No results  for input(s): LIPASE, AMYLASE in the last 72 hours. Cardiac Enzymes No results for input(s): CKTOTAL, CKMB, CKMBINDEX, TROPONINI in the last 72 hours.  BNP: BNP (last 3 results) Recent Labs    03/31/19 0013  BNP 1,247.4*    ProBNP (last 3 results) No results for input(s): PROBNP in the last 8760 hours.   D-Dimer No results for input(s): DDIMER in the last 72 hours. Hemoglobin A1C No results for input(s): HGBA1C in the last 72 hours. Fasting Lipid Panel No results for input(s): CHOL, HDL, LDLCALC, TRIG, CHOLHDL, LDLDIRECT in the last 72 hours. Thyroid Function Tests No results for input(s): TSH, T4TOTAL, T3FREE, THYROIDAB in the last 72 hours.  Invalid input(s): FREET3  Other results:   Imaging    No results found.   Medications:     Scheduled Medications: . sodium chloride   Intravenous Once  . amiodarone  200 mg Oral Daily  . aspirin  81 mg Oral Daily  . atorvastatin  80 mg Oral q1800  . carvedilol  3.125 mg Oral BID WC  . chlorhexidine  15 mL Mouth Rinse BID  . Chlorhexidine Gluconate Cloth  6 each Topical Daily  . digoxin  0.125 mg Oral Daily  . feeding supplement (ENSURE ENLIVE)  237 mL Oral BID BM  . heparin injection (subcutaneous)  5,000 Units Subcutaneous Q8H  . insulin aspart  0-9 Units Subcutaneous Q4H  . insulin glargine  15 Units Subcutaneous Daily  . losartan  12.5 mg Oral QHS  . mouth rinse  15 mL Mouth Rinse q12n4p  . pantoprazole  40 mg Oral Daily  . sodium chloride flush  10-40 mL Intracatheter Q12H  . sorbitol  30 mL Per Tube Once  . spironolactone  25 mg Oral Daily  . Thrombi-Pad  1 each Topical Once  . ticagrelor  90 mg Oral BID    Infusions: . sodium chloride Stopped (04/06/19 0930)    PRN Medications: acetaminophen, clonazepam, ipratropium-albuterol, nitroGLYCERIN, ondansetron (ZOFRAN) IV, Resource ThickenUp Clear, sodium chloride flush   *  Assessment/Plan    1. Cardiac arrest/VT/torsades on 8/15 - rhythm stable on IV  amio - Keep K> 4.0 Mg > 2.0. K 4.2 today - EP appreciated.  - S/P ICD  - He has follow up with EP.   2. Acute systolic HF -> Cardiogenic shock due to acute anterior MI - s/p PCI/DES to LAD on 8/9 - Impella removed 8/12 - Volume status stable. No need for lasix - Continue digoxin 0.125  - Continue spiro to 25 mg daily.   - Entresto with low BP. No on losartan 12.5 qhs.  - Continue low dose carvedilol - Echo 20-25%  3. CAD with acute anterior MI 8/9 - s/p PCI/DES to LAD. 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 Acute hypoxic respiratory failure - in setting of #1 - extubated 8/14. Reintubated 8/15 in setting of torsades. Extubated 8/16.  -On room air.    4. DM2 - new diagnosis - Diabetes coordinator consulted.   - Stop lantus and start metoformin 500 mg twice daily - He has follow up at [facility].   5. COPD - ongoing tobacco use - needs cessation   6. NSVT/Torsades - Has had > 10 gram load amio.  - Now on po amio 200 daily - Keep K> 4.0 Mg > 2.0 Give 2 grams of Mag now.  - EP appreciated.S/P ICD   7. Anemia, acute blood loss - Got 1u RBC on 8/15.  - Hgb stable at 10   8. Delirium  - resolved with clonidine + Seroquel.  - Does not need for discharge.   Home today.     Length of Stay: [place], NP  04/16/2019, 8:41 AM  Advanced Heart Failure Team Pager [phone] (M-F; [place])  Please contact [facility] for night-coverage after hours (4p -7a ) and weekends on [email or web address]  Patient seen and examined with Darrick Grinder, NP. We discussed all aspects of the encounter. I agree with the assessment and plan as stated above.   Doing well s/p ICD implant. Site looks good. Rhythm stable. No CP or SOB.  Meds reviewed. F/u HF Clinic 1 week.   [name], MD  1:16 PM

## 2019-04-16 NOTE — Progress Notes (Addendum)
Electrophysiology Rounding Note  Patient Name: Stephen Jennings Date of Encounter: 04/16/2019  Primary Cardiologist: [place] Electrophysiologist: Curt Bears   Subjective   The patient is doing well today.  At this time, the patient denies chest pain, shortness of breath, or any new concerns.  Inpatient Medications    Scheduled Meds: . sodium chloride   Intravenous Once  . amiodarone  200 mg Oral Daily  . aspirin  81 mg Oral Daily  . atorvastatin  80 mg Oral q1800  . carvedilol  3.125 mg Oral BID WC  . chlorhexidine  15 mL Mouth Rinse BID  . Chlorhexidine Gluconate Cloth  6 each Topical Daily  . digoxin  0.125 mg Oral Daily  . feeding supplement (ENSURE ENLIVE)  237 mL Oral BID BM  . heparin injection (subcutaneous)  5,000 Units Subcutaneous Q8H  . insulin aspart  0-9 Units Subcutaneous Q4H  . losartan  12.5 mg Oral QHS  . mouth rinse  15 mL Mouth Rinse q12n4p  . metFORMIN  500 mg Oral BID WC  . pantoprazole  40 mg Oral Daily  . sodium chloride flush  10-40 mL Intracatheter Q12H  . sorbitol  30 mL Per Tube Once  . spironolactone  25 mg Oral Daily  . Thrombi-Pad  1 each Topical Once  . ticagrelor  90 mg Oral BID   Continuous Infusions: . sodium chloride Stopped (04/06/19 0930)  . magnesium sulfate bolus IVPB     PRN Meds: acetaminophen, clonazepam, ipratropium-albuterol, nitroGLYCERIN, ondansetron (ZOFRAN) IV, Resource ThickenUp Clear, sodium chloride flush   Vital Signs    Vitals:   04/15/19 2335 04/16/19 0336 04/16/19 0400 04/16/19 0756  BP: (!) 90/52 (!) 93/57 115/63   Pulse:    63  Resp: (!) 24 16 (!) 24   Temp: 97.7 F (36.5 C) (!) 97.4 F (36.3 C)    TempSrc: Oral Oral    SpO2: 95% 98% 94%   Weight:  78.9 kg    Height:        Intake/Output Summary (Last 24 hours) at 04/16/2019 0858 Last data filed at 04/16/2019 0400 Gross per 24 hour  Intake 50 ml  Output 400 ml  Net -350 ml   Filed Weights   04/14/19 0500 04/15/19 0300 04/16/19 0336  Weight: 79.6  kg 79.6 kg 78.9 kg    Physical Exam    GEN- The patient is well appearing, alert and oriented x 3 today.   Head- normocephalic, atraumatic Eyes-  Sclera clear, conjunctiva pink Ears- hearing intact Oropharynx- clear Neck- supple Lungs- Clear to ausculation bilaterally, normal work of breathing Heart- Regular rate and rhythm  GI- soft, NT, ND, + BS Extremities- no clubbing, cyanosis, or edema Skin- no rash or lesion, left chest without hematoma  Psych- euthymic mood, full affect Neuro- strength and sensation are intact  Labs    CBC Recent Labs    04/15/19 0223 04/16/19 0202  WBC 7.9 7.7  HGB 9.2* 10.0*  HCT 29.1* 30.9*  MCV 93.6 94.2  PLT 668* [id number]   Basic Metabolic Panel Recent Labs    04/15/19 0223 04/16/19 0202  NA 139 138  K 4.3 4.1  CL 105 104  CO2 26 23  GLUCOSE 95 104*  BUN 17 17  CREATININE 0.94 0.97  CALCIUM 8.7* 8.6*  MG 2.0 1.8     Telemetry    SR (personally reviewed)  Radiology    No results found.   Assessment & Plan    1.  Cardiac arrest Doing  well s/p ICD CXR without obvious ptx Device interrogated and functioning normally Routine wound care and follow up Continue amiodarone No driving x6 months  2.  CAD S/p DES to LAD Continue medical therapy  3.  Systolic heart failure Per AHF team  EP Stephen Jennings sign off.  Follow up appointments/instructions entered in AVS   For questions or updates, please contact [place] Please consult [email or web address] for contact info under Cardiology/STEMI.  Signed, Stephen Marshall, NP  04/16/2019, 8:58 AM   I have seen and examined this patient with Stephen Jennings.  Agree with above, note added to reflect my findings.  On exam, RRR, no murmurs, lungs clear.  Patient status post [facility] Jude dual-chamber defibrillator.  Device functioning appropriately.  Chest x-ray and interrogation without issues.  EP to sign off today.  We Stephen Jennings arrange for follow-up in EP clinic.  Stephen Dewalt M. Kyle Luppino MD 04/16/2019 9:14  AM

## 2019-04-16 NOTE — Progress Notes (Signed)
CARDIAC REHAB PHASE I   D/c education completed with pt. Pt educated on importance of ASA, Brilinta, and NTG. Pt given MI book, heart healthy and diabetic diets. Pt able to recall restrictions related to ICD placement. Reviewed restrictions, site care and exercise guidelines. Pt educated on daily weights and signs any symptoms of heart failure. Pt given smoking cessation tip sheet. Will refer to CRP II [place], pt not appropriate for [facility]. Pt requesting rolling walker for home use, CSW aware.  [id number] Rufina Falco, RN BSN 04/16/2019 10:40 AM

## 2019-04-16 NOTE — Plan of Care (Signed)
Problem: Education: Goal: Knowledge of General Education information will improve Description: Including pain rating scale, medication(s)/side effects and non-pharmacologic comfort measures 04/16/2019 1115 by Lurline Idol, RN Outcome: Adequate for Discharge 04/16/2019 1109 by Lurline Idol, RN Outcome: Adequate for Discharge   Problem: Health Behavior/Discharge Planning: Goal: Ability to manage health-related needs will improve 04/16/2019 1115 by Lurline Idol, RN Outcome: Adequate for Discharge 04/16/2019 1109 by Lurline Idol, RN Outcome: Adequate for Discharge   Problem: Clinical Measurements: Goal: Ability to maintain clinical measurements within normal limits will improve 04/16/2019 1115 by Lurline Idol, RN Outcome: Adequate for Discharge 04/16/2019 1109 by Lurline Idol, RN Outcome: Adequate for Discharge Goal: Will remain free from infection 04/16/2019 1115 by Lurline Idol, RN Outcome: Adequate for Discharge 04/16/2019 1109 by Lurline Idol, RN Outcome: Adequate for Discharge Goal: Diagnostic test results will improve 04/16/2019 1115 by Lurline Idol, RN Outcome: Adequate for Discharge 04/16/2019 1109 by Lurline Idol, RN Outcome: Adequate for Discharge Goal: Respiratory complications will improve 04/16/2019 1115 by Lurline Idol, RN Outcome: Adequate for Discharge 04/16/2019 1109 by Lurline Idol, RN Outcome: Adequate for Discharge Goal: Cardiovascular complication will be avoided 04/16/2019 1115 by Lurline Idol, RN Outcome: Adequate for Discharge 04/16/2019 1109 by Lurline Idol, RN Outcome: Adequate for Discharge   Problem: Activity: Goal: Risk for activity intolerance will decrease 04/16/2019 1115 by Lurline Idol, RN Outcome: Adequate for Discharge 04/16/2019 1109 by Lurline Idol, RN Outcome: Adequate for Discharge   Problem: Nutrition: Goal: Adequate nutrition will be maintained 04/16/2019 1115 by Lurline Idol, RN Outcome: Adequate for  Discharge 04/16/2019 1109 by Lurline Idol, RN Outcome: Adequate for Discharge   Problem: Coping: Goal: Level of anxiety will decrease 04/16/2019 1115 by Lurline Idol, RN Outcome: Adequate for Discharge 04/16/2019 1109 by Lurline Idol, RN Outcome: Adequate for Discharge   Problem: Elimination: Goal: Will not experience complications related to bowel motility 04/16/2019 1115 by Lurline Idol, RN Outcome: Adequate for Discharge 04/16/2019 1109 by Lurline Idol, RN Outcome: Adequate for Discharge Goal: Will not experience complications related to urinary retention 04/16/2019 1115 by Lurline Idol, RN Outcome: Adequate for Discharge 04/16/2019 1109 by Lurline Idol, RN Outcome: Adequate for Discharge   Problem: Pain Managment: Goal: General experience of comfort will improve 04/16/2019 1115 by Lurline Idol, RN Outcome: Adequate for Discharge 04/16/2019 1109 by Lurline Idol, RN Outcome: Adequate for Discharge   Problem: Safety: Goal: Ability to remain free from injury will improve 04/16/2019 1115 by Lurline Idol, RN Outcome: Adequate for Discharge 04/16/2019 1109 by Lurline Idol, RN Outcome: Adequate for Discharge   Problem: Skin Integrity: Goal: Risk for impaired skin integrity will decrease 04/16/2019 1115 by Lurline Idol, RN Outcome: Adequate for Discharge 04/16/2019 1109 by Lurline Idol, RN Outcome: Adequate for Discharge   Problem: Education: Goal: Ability to demonstrate management of disease process will improve 04/16/2019 1115 by Lurline Idol, RN Outcome: Adequate for Discharge 04/16/2019 1109 by Lurline Idol, RN Outcome: Adequate for Discharge Goal: Ability to verbalize understanding of medication therapies will improve 04/16/2019 1115 by Lurline Idol, RN Outcome: Adequate for Discharge 04/16/2019 1109 by Lurline Idol, RN Outcome: Adequate for Discharge Goal: Individualized Educational Video(s) 04/16/2019 1115 by Lurline Idol, RN Outcome:  Adequate for Discharge 04/16/2019 1109 by Lurline Idol, RN Outcome: Adequate for Discharge   Problem: Activity: Goal: Capacity to carry out  activities will improve 04/16/2019 1115 by Lurline Idol, RN Outcome: Adequate for Discharge 04/16/2019 1109 by Lurline Idol, RN Outcome: Adequate for Discharge   Problem: Cardiac: Goal: Ability to achieve and maintain adequate cardiopulmonary perfusion will improve 04/16/2019 1115 by Lurline Idol, RN Outcome: Adequate for Discharge 04/16/2019 1109 by Lurline Idol, RN Outcome: Adequate for Discharge   Problem: Activity: Goal: Ability to tolerate increased activity will improve 04/16/2019 1115 by Lurline Idol, RN Outcome: Adequate for Discharge 04/16/2019 1109 by Lurline Idol, RN Outcome: Adequate for Discharge   Problem: Respiratory: Goal: Ability to maintain a clear airway and adequate ventilation will improve 04/16/2019 1115 by Lurline Idol, RN Outcome: Adequate for Discharge 04/16/2019 1109 by Lurline Idol, RN Outcome: Adequate for Discharge   Problem: Education: Goal: Understanding of cardiac disease, CV risk reduction, and recovery process will improve 04/16/2019 1115 by Lurline Idol, RN Outcome: Adequate for Discharge 04/16/2019 1109 by Lurline Idol, RN Outcome: Adequate for Discharge Goal: Understanding of medication regimen will improve 04/16/2019 1115 by Lurline Idol, RN Outcome: Adequate for Discharge 04/16/2019 1109 by Lurline Idol, RN Outcome: Adequate for Discharge Goal: Individualized Educational Video(s) 04/16/2019 1115 by Lurline Idol, RN Outcome: Adequate for Discharge 04/16/2019 1109 by Lurline Idol, RN Outcome: Adequate for Discharge   Problem: Activity: Goal: Ability to tolerate increased activity will improve 04/16/2019 1115 by Lurline Idol, RN Outcome: Adequate for Discharge 04/16/2019 1109 by Lurline Idol, RN Outcome: Adequate for Discharge   Problem: Cardiac: Goal: Ability to achieve  and maintain adequate cardiopulmonary perfusion will improve 04/16/2019 1115 by Lurline Idol, RN Outcome: Adequate for Discharge 04/16/2019 1109 by Lurline Idol, RN Outcome: Adequate for Discharge Goal: Vascular access site(s) Level 0-1 will be maintained 04/16/2019 1115 by Lurline Idol, RN Outcome: Adequate for Discharge 04/16/2019 1109 by Lurline Idol, RN Outcome: Adequate for Discharge   Problem: Health Behavior/Discharge Planning: Goal: Ability to safely manage health-related needs after discharge will improve 04/16/2019 1115 by Lurline Idol, RN Outcome: Adequate for Discharge 04/16/2019 1109 by Lurline Idol, RN Outcome: Adequate for Discharge

## 2019-04-16 NOTE — Progress Notes (Addendum)
Patient is being d/c today  Vitals WNL and patient has no complaints of pain.  Tele called during D/C to report that at 07:25 this morning the patient had 7 beats of VTAC.  Will discuss with MD before discharging.  Spoke with charge nurse aboun vtac beats this morning and she said it was ok to D/C at this time since it was early this morning and the patient is fine now.

## 2019-04-16 NOTE — Discharge Instructions (Signed)
Supplemental Discharge Instructions for  Pacemaker/Defibrillator Patients  Activity No heavy lifting or vigorous activity with your left/right arm for 6 to 8 weeks.  Do not raise your left/right arm above your head for one week.  Gradually raise your affected arm as drawn below.           __         04/20/19                       04/21/19                04/22/19                   04/23/19  NO DRIVING for  6 months      WOUND CARE - Keep the wound area clean and dry.  Do not get this area wet for one week. No showers for one week; you may shower on  04/23/19   . - The tape/steri-strips on your wound will fall off; do not pull them off.  No bandage is needed on the site.  DO  NOT apply any creams, oils, or ointments to the wound area. - If you notice any drainage or discharge from the wound, any swelling or bruising at the site, or you develop a fever > 101? F after you are discharged home, call the office at once.  Special Instructions - You are still able to use cellular telephones; use the ear opposite the side where you have your pacemaker/defibrillator.  Avoid carrying your cellular phone near your device. - When traveling through airports, show security personnel your identification card to avoid being screened in the metal detectors.  Ask the security personnel to use the hand wand. - Avoid arc welding equipment, MRI testing (magnetic resonance imaging), TENS units (transcutaneous nerve stimulators).  Call the office for questions about other devices. - Avoid electrical appliances that are in poor condition or are not properly grounded. - Microwave ovens are safe to be near or to operate.  Additional information for defibrillator patients should your device go off: - If your device goes off ONCE and you feel fine afterward, notify the device clinic nurses. - If your device goes off ONCE and you do not feel well afterward, call 911. - If your device goes off TWICE, call 911. - If your  device goes off THREE times in one day, call 911.  DO NOT DRIVE YOURSELF OR A FAMILY MEMBER WITH A DEFIBRILLATOR TO THE HOSPITAL--CALL 911.      Information about your medication: Brilinta (anti-platelet agent)  Generic Name (Brand): ticagrelor (Brilinta), twice daily medication  PURPOSE: You are taking this medication along with aspirin to lower your chance of having a heart attack, stroke, or blood clots in your heart stent. These can be fatal. Brilinta and aspirin help prevent platelets from sticking together and forming a clot that can block an artery or your stent.   Common SIDE EFFECTS you may experience include: bruising or bleeding more easily, shortness of breath  Do not stop taking BRILINTA without talking to the doctor who prescribes it for you. People who are treated with a stent and stop taking Brilinta too soon, have a higher risk of getting a blood clot in the stent, having a heart attack, or dying. If you stop Brilinta because of bleeding, or for other reasons, your risk of a heart attack or stroke may increase.   Tell all of  your doctors and dentists that you are taking Brilinta. They should talk to the doctor who prescribed Brilinta for you before you have any surgery or invasive procedure.   Contact your health care provider if you experience: severe or uncontrollable bleeding, pink/red/brown urine, vomiting blood or vomit that looks like "coffee grounds", red or black stools (looks like tar), coughing up blood or blood clots ----------------------------------------------------------------------------------------------------------------------

## 2019-04-16 NOTE — Progress Notes (Addendum)
Inpatient Diabetes Program Recommendations  AACE/ADA: New Consensus Statement on Inpatient Glycemic Control (2015)  Target Ranges:  Prepandial:   less than 140 mg/dL      Peak postprandial:   less than 180 mg/dL (1-2 hours)      Critically ill patients:  140 - 180 mg/dL   Lab Results  Component Value Date   GLUCAP 98 04/16/2019   HGBA1C 6.0 (H) 03/31/2019    Review of Glycemic Control  Diabetes history: None Pre DM this admission  Current orders for Inpatient glycemic control:  Metformin 500 mg bid Novolog 0-9 units Q4 hours  Inpatient Diabetes Program Recommendations:    Noted A1c 6%, pre DM level. Diagnosis of DM starts at 6.5%. Agree with Metformin bid. Will see patient today.  Change Diet to ACHS since diet ordered and patient is eating May add carb modified diet to diet order  Addendum 11:30  Spoke with patient and wife at bedside regarding A1c of 6% and pre-DM diet. Spoke with patient about monitoring glucose trends with meter from [facility] and watch for hypoglycemia. Spoke with patient and wife regarding metformin and common side effects of the medication. Patient to take with food.   Thanks,  Tama Headings RN, MSN, BC-ADM Inpatient Diabetes Coordinator Team Pager [phone] (8a-5p)

## 2019-04-16 NOTE — Progress Notes (Signed)
Orthopedic Tech Progress Note Patient Details:  Stephen Jennings 11-28-1955 [id number] RN said patient has on arm sling Patient ID: Stephen Jennings, male   DOB: 08-Apr-1956, 63 y.o.   MRN: [id number]   Stephen Jennings 04/16/2019, 8:15 AM

## 2019-04-16 NOTE — Plan of Care (Signed)
  Problem: Education: Goal: Knowledge of General Education information will improve Description: Including pain rating scale, medication(s)/side effects and non-pharmacologic comfort measures Outcome: Adequate for Discharge   Problem: Health Behavior/Discharge Planning: Goal: Ability to manage health-related needs will improve Outcome: Adequate for Discharge   Problem: Clinical Measurements: Goal: Ability to maintain clinical measurements within normal limits will improve Outcome: Adequate for Discharge Goal: Will remain free from infection Outcome: Adequate for Discharge Goal: Diagnostic test results will improve Outcome: Adequate for Discharge Goal: Respiratory complications will improve Outcome: Adequate for Discharge Goal: Cardiovascular complication will be avoided Outcome: Adequate for Discharge   Problem: Activity: Goal: Risk for activity intolerance will decrease Outcome: Adequate for Discharge   Problem: Nutrition: Goal: Adequate nutrition will be maintained Outcome: Adequate for Discharge   Problem: Coping: Goal: Level of anxiety will decrease Outcome: Adequate for Discharge   Problem: Elimination: Goal: Will not experience complications related to bowel motility Outcome: Adequate for Discharge Goal: Will not experience complications related to urinary retention Outcome: Adequate for Discharge   Problem: Pain Managment: Goal: General experience of comfort will improve Outcome: Adequate for Discharge   Problem: Safety: Goal: Ability to remain free from injury will improve Outcome: Adequate for Discharge   Problem: Skin Integrity: Goal: Risk for impaired skin integrity will decrease Outcome: Adequate for Discharge   Problem: Education: Goal: Ability to demonstrate management of disease process will improve Outcome: Adequate for Discharge Goal: Ability to verbalize understanding of medication therapies will improve Outcome: Adequate for Discharge Goal:  Individualized Educational Video(s) Outcome: Adequate for Discharge   Problem: Activity: Goal: Capacity to carry out activities will improve Outcome: Adequate for Discharge   Problem: Cardiac: Goal: Ability to achieve and maintain adequate cardiopulmonary perfusion will improve Outcome: Adequate for Discharge   Problem: Activity: Goal: Ability to tolerate increased activity will improve Outcome: Adequate for Discharge   Problem: Respiratory: Goal: Ability to maintain a clear airway and adequate ventilation will improve Outcome: Adequate for Discharge   Problem: Education: Goal: Understanding of cardiac disease, CV risk reduction, and recovery process will improve Outcome: Adequate for Discharge Goal: Understanding of medication regimen will improve Outcome: Adequate for Discharge Goal: Individualized Educational Video(s) Outcome: Adequate for Discharge   Problem: Activity: Goal: Ability to tolerate increased activity will improve Outcome: Adequate for Discharge   Problem: Cardiac: Goal: Ability to achieve and maintain adequate cardiopulmonary perfusion will improve Outcome: Adequate for Discharge Goal: Vascular access site(s) Level 0-1 will be maintained Outcome: Adequate for Discharge   Problem: Health Behavior/Discharge Planning: Goal: Ability to safely manage health-related needs after discharge will improve Outcome: Adequate for Discharge

## 2019-04-24 ENCOUNTER — Encounter (HOSPITAL_COMMUNITY): Payer: Self-pay | Admitting: Cardiovascular Disease

## 2019-04-25 ENCOUNTER — Other Ambulatory Visit: Payer: Self-pay

## 2019-04-25 ENCOUNTER — Ambulatory Visit (HOSPITAL_COMMUNITY)
Admission: RE | Admit: 2019-04-25 | Discharge: 2019-04-25 | Disposition: A | Discharge status: Home / Self Care | Payer: Medicaid Other | Source: Ambulatory Visit | Attending: Internal Medicine | Admitting: Internal Medicine

## 2019-04-25 ENCOUNTER — Encounter (HOSPITAL_COMMUNITY): Payer: Self-pay

## 2019-04-25 VITALS — BP 124/64 | HR 61 | Wt 172.4 lb

## 2019-04-25 DIAGNOSIS — I2510 Atherosclerotic heart disease of native coronary artery without angina pectoris: Secondary | ICD-10-CM | POA: Diagnosis not present

## 2019-04-25 DIAGNOSIS — J449 Chronic obstructive pulmonary disease, unspecified: Secondary | ICD-10-CM | POA: Insufficient documentation

## 2019-04-25 DIAGNOSIS — Z7984 Long term (current) use of oral hypoglycemic drugs: Secondary | ICD-10-CM | POA: Diagnosis not present

## 2019-04-25 DIAGNOSIS — Z7902 Long term (current) use of antithrombotics/antiplatelets: Secondary | ICD-10-CM | POA: Insufficient documentation

## 2019-04-25 DIAGNOSIS — Z8674 Personal history of sudden cardiac arrest: Secondary | ICD-10-CM | POA: Insufficient documentation

## 2019-04-25 DIAGNOSIS — Z87891 Personal history of nicotine dependence: Secondary | ICD-10-CM | POA: Diagnosis not present

## 2019-04-25 DIAGNOSIS — I252 Old myocardial infarction: Secondary | ICD-10-CM | POA: Diagnosis not present

## 2019-04-25 DIAGNOSIS — I255 Ischemic cardiomyopathy: Secondary | ICD-10-CM | POA: Diagnosis not present

## 2019-04-25 DIAGNOSIS — Z7982 Long term (current) use of aspirin: Secondary | ICD-10-CM | POA: Insufficient documentation

## 2019-04-25 DIAGNOSIS — Z79899 Other long term (current) drug therapy: Secondary | ICD-10-CM | POA: Insufficient documentation

## 2019-04-25 DIAGNOSIS — I2102 ST elevation (STEMI) myocardial infarction involving left anterior descending coronary artery: Secondary | ICD-10-CM

## 2019-04-25 DIAGNOSIS — Z955 Presence of coronary angioplasty implant and graft: Secondary | ICD-10-CM | POA: Diagnosis not present

## 2019-04-25 DIAGNOSIS — E119 Type 2 diabetes mellitus without complications: Secondary | ICD-10-CM | POA: Insufficient documentation

## 2019-04-25 DIAGNOSIS — E785 Hyperlipidemia, unspecified: Secondary | ICD-10-CM

## 2019-04-25 DIAGNOSIS — Z9861 Coronary angioplasty status: Secondary | ICD-10-CM

## 2019-04-25 LAB — BASIC METABOLIC PANEL
Anion gap: 11 (ref 5–15)
BUN: 17 mg/dL (ref 8–23)
CO2: 25 mmol/L (ref 22–32)
Calcium: 9.5 mg/dL (ref 8.9–10.3)
Chloride: 100 mmol/L (ref 98–111)
Creatinine, Ser: 0.9 mg/dL (ref 0.61–1.24)
GFR calc Af Amer: >60 mL/min (ref >60)
GFR calc non Af Amer: >60 mL/min (ref >60)
Glucose, Bld: 132 mg/dL — ABNORMAL HIGH (ref 70–99)
Potassium: 4.7 mmol/L (ref 3.5–5.1)
Sodium: 136 mmol/L (ref 135–145)

## 2019-04-25 MED ORDER — SPIRONOLACTONE 25 MG PO TABS: 25.0000 mg | ORAL_TABLET | Freq: Every day | ORAL | 2 refills | Status: DC

## 2019-04-25 MED ORDER — TICAGRELOR 90 MG PO TABS: 90.0000 mg | ORAL_TABLET | Freq: Two times a day (BID) | ORAL | 2 refills | Status: DC

## 2019-04-25 MED ORDER — DIGOXIN 125 MCG PO TABS: 0.1250 mg | ORAL_TABLET | Freq: Every day | ORAL | 2 refills | Status: DC

## 2019-04-25 MED ORDER — AMIODARONE HCL 200 MG PO TABS: 200.0000 mg | ORAL_TABLET | Freq: Every day | ORAL | 6 refills | Status: DC

## 2019-04-25 MED ORDER — LOSARTAN POTASSIUM 25 MG PO TABS: 25.0000 mg | ORAL_TABLET | Freq: Every day | ORAL | 2 refills | Status: DC

## 2019-04-25 MED ORDER — CARVEDILOL 3.125 MG PO TABS: 3.1250 mg | ORAL_TABLET | Freq: Two times a day (BID) | ORAL | 2 refills | Status: DC

## 2019-04-25 MED ORDER — ATORVASTATIN CALCIUM 80 MG PO TABS: 80.0000 mg | ORAL_TABLET | Freq: Every day | ORAL | 2 refills | Status: DC

## 2019-04-25 MED ORDER — ASPIRIN 81 MG PO CHEW: 81.0000 mg | CHEWABLE_TABLET | Freq: Every day | ORAL | 2 refills | Status: DC

## 2019-04-25 MED FILL — LOSARTAN POTASSIUM 25 MG TA: 25 | 30 days supply | Qty: 30 | Fill #0

## 2019-04-25 MED FILL — AMIODARONE HCL 200 MG TAB: 200 | 30 days supply | Qty: 30 | Fill #0

## 2019-04-25 MED FILL — CARVEDILOL 3.125 MG TABLET: 3.125 | 30 days supply | Qty: 60 | Fill #0

## 2019-04-25 MED FILL — BRILINTA 90 MG TABLET: 90 | 30 days supply | Qty: 60 | Fill #0

## 2019-04-25 MED FILL — ASPIRIN CHILD 81 MG TAB CHE: 81 | 30 days supply | Qty: 30 | Fill #0

## 2019-04-25 MED FILL — ATORVASTATIN 80 MG TABLET: 80 | 30 days supply | Qty: 30 | Fill #0

## 2019-04-25 MED FILL — SPIRONOLACTONE 25 MG TABLET: 25 | 30 days supply | Qty: 30 | Fill #0

## 2019-04-25 MED FILL — DIGOXIN 0.125 MG TABLET: 125 | 30 days supply | Qty: 30 | Fill #0

## 2019-04-25 NOTE — Progress Notes (Signed)
CSW consulted to speak with pt regarding medication cost concerns and lack of insurance.  CSW met with pt in clinic and discussed current lack of insurance.  Pt was recently hospitalized and states he has submitted a medicaid and disability application with the help of the financial counseling teams.  CSW reviewed financial counseling notes and confirmed this is correct.  CSW explained that patient would need to wait to hear determination regarding this application before he would be eligible for other assistance programs such as [facility] Assistance Fund or the Blue Card through [facility].  If pt is denied for Medicaid the financial counselors will supply him with a [facility] Assistance application.  Patient has initial appt with [facility] this month so if he is not eligible for Medicaid their financial counselor can assist in getting him the blue card.  In the meantime the Clinic staff have sent all eligible medications to the outpatient pharmacy under the [facility].  The only medication that is expensive and is not covered by the fund is Brilinta.  CSW found assistance application through [employer] and patient will plan to come to the clinic tomorrow to sign it so we can submit.  Discuss with APP what to do if pt is not eligible- will plan to change medication if he can't get assistance- CSW confirmed with pt that he has 3 weeks left of medication so will hopefully hear regarding assistance prior to that running out.  CSW will continue to follow and assist as needed   H. , LCSW Clinical Social Worker [facility] Desk#: [phone] Cell#: [phone]   

## 2019-04-25 NOTE — Progress Notes (Signed)
Cardiology Office Note Date:  04/26/2019  Patient ID:  Stephen Jennings, DOB [birth year], MRN [id number] PCP:  Patient, No Pcp Per  Cardiologist:  Dr. Haroldine Laws Electrophysiologist: Dr. Curt Bears     Chief Complaint: post ICD implant  History of Present Illness: Stephen Jennings is a 63 y.o. male with history of only until his hospital stay last month.  He was admitted to [facility] with STEMI 03/31/2019, he underwent emergent cath with totally occluded prox LAD. LCX anomalous off RCA. Underwent PCI of LAD which was diffusely diseased vessel, his cath complicated by shock requiring pressors, Impella and was intubated  He was extubated 04/05/2019, early morning of 04/06/2019 he had cardiac arrest, code note mentions pulseless V-tach *tele reviewed was PMVT) and returned to sinus with 71m epi x2 and 2 cardioversion and was poorly responsive immediately afterwards and re-intubate, K+ was 3.7, 3.8, Mag 1.8, He had 1u PRBC for hgb 7.2  He underwent SJM dual chamber ICD implant 04/15/2019. Discharged 04/16/2019   He has done well since discharge, no CP, palpitations, no dizzy spells, near syncope or syncope.  No SOB or DOE, he denies symptoms of PND or orthopnea as well.  He has kept off the cigarettes !!   Device information: SJM dual chamber ICD, implanted 04/15/2019, secondary prevention   Past Medical History:  Diagnosis Date  . Medical history non-contributory     Past Surgical History:  Procedure Laterality Date  . CORONARY/GRAFT ACUTE MI REVASCULARIZATION N/A 03/31/2019   Procedure: Coronary/Graft Acute MI Revascularization;  Surgeon: Troy Sine, MD;  Location: [place] CV LAB;  Service: Cardiovascular;  Laterality: N/A;  . ICD IMPLANT N/A 04/15/2019   Procedure: ICD IMPLANT;  Surgeon: Constance Haw, MD;  Location: [place] CV LAB;  Service: Cardiovascular;  Laterality: N/A;  . LEFT HEART CATH AND CORONARY ANGIOGRAPHY N/A 03/31/2019   Procedure: LEFT HEART CATH AND CORONARY  ANGIOGRAPHY;  Surgeon: Troy Sine, MD;  Location: [place] CV LAB;  Service: Cardiovascular;  Laterality: N/A;  . NO PAST SURGERIES    . RIGHT HEART CATH N/A 03/31/2019   Procedure: RIGHT HEART CATH;  Surgeon: Troy Sine, MD;  Location: [place] CV LAB;  Service: Cardiovascular;  Laterality: N/A;  . VENTRICULAR ASSIST DEVICE INSERTION N/A 03/31/2019   Procedure: VENTRICULAR ASSIST DEVICE INSERTION;  Surgeon: Troy Sine, MD;  Location: [place] CV LAB;  Service: Cardiovascular;  Laterality: N/A;    Current Outpatient Medications  Medication Sig Dispense Refill  . amiodarone (PACERONE) 200 MG tablet Take 1 tablet (200 mg total) by mouth daily. Patient is in the HF Assistance Program. 30 tablet 6  . aspirin 81 MG chewable tablet Chew 1 tablet (81 mg total) by mouth daily. 30 tablet 2  . atorvastatin (LIPITOR) 80 MG tablet Take 1 tablet (80 mg total) by mouth daily at 6 PM. 30 tablet 2  . carvedilol (COREG) 3.125 MG tablet Take 1 tablet (3.125 mg total) by mouth 2 (two) times daily with a meal. 60 tablet 2  . digoxin (LANOXIN) 0.125 MG tablet Take 1 tablet (0.125 mg total) by mouth daily. 30 tablet 2  . losartan (COZAAR) 25 MG tablet Take 1 tablet (25 mg total) by mouth at bedtime. 30 tablet 2  . metFORMIN (GLUCOPHAGE) 500 MG tablet Take 1 tablet (500 mg total) by mouth 2 (two) times daily with a meal. 60 tablet 6  . Naphazoline HCl (CLEAR EYES OP) Place 1 drop into both eyes daily as needed (dry  eyes/ irritation).    . nitroGLYCERIN (NITROSTAT) 0.4 MG SL tablet Place 1 tablet (0.4 mg total) under the tongue every 5 (five) minutes x 3 doses as needed for chest pain. (Patient not taking: Reported on 04/25/2019) 25 tablet 12  . spironolactone (ALDACTONE) 25 MG tablet Take 1 tablet (25 mg total) by mouth daily. 30 tablet 2  . ticagrelor (BRILINTA) 90 MG TABS tablet Take 1 tablet (90 mg total) by mouth 2 (two) times daily. 60 tablet 2   No current facility-administered medications for  this visit.     Allergies:   Patient has no known allergies.   Social History:  The patient  reports that he quit smoking about 2 weeks ago. He has never used smokeless tobacco. He reports that he does not drink alcohol or use drugs.   Family History:  The patient's Family history is unknown by patient.  ROS:  Please see the history of present illness.  All other systems are reviewed and otherwise negative.   PHYSICAL EXAM:  VS:  BP 122/66   Pulse (!) 58   Ht 5\' 9"  (1.753 m)   Wt 169 lb (76.7 kg)   BMI 24.96 kg/m  BMI: Body mass index is 24.96 kg/m. Well nourished, well developed, in no acute distress  HEENT: normocephalic, atraumatic  Neck: no JVD, carotid bruits or masses Cardiac:  RRR; no significant murmurs, no rubs, or gallops Lungs:  CTA b/l, no wheezing, rhonchi or rales  Abd: soft, nontender MS: no deformity or atrophy Ext:  no edema  Skin: warm and dry, no rash Neuro:  No gross deficits appreciated Psych: euthymic mood, full affect  ICD site is stable, steri strips were removed without difficulty, wound edges are well approximated and healed.  No erythema edema, fluctuation.  No increased heat to the surrounding tissues.   EKG:  Not done today ICD interrogation done today and reviewed by myself battery and lead measurements are stable, no device observations, acute implant outputs remain   03/31/2019: TTE IMPRESSIONS 1. The left ventricle has severely reduced systolic function, with an ejection fraction of 20-25%. The cavity size was moderately dilated. Left ventricular diastolic function could not be evaluated. 2. Septal apical and anterior wall hyppkinesis Impella device seen in LV extending to mid ventrical CW through AV suggests low output. 3. The right ventricle has moderately reduced systolic function. The cavity was mildly enlarged. There is no increase in right ventricular wall thickness. 4. Left atrial size was moderately dilated. 5. Right atrial size  was mildly dilated. 6. The aortic valve was not well visualized. 7. Impella device seen coursing across AV. 8. The aorta is normal in size and structure. 9. The interatrial septum was not well visualized.  03/31/2019: R/LHC w/PCI to LAD/Diag  Mid LM to Prox LAD lesion is 100% stenosed.  Mid LAD lesion is 100% stenosed.  A drug-eluting stent was successfully placed using a STENT RESOLUTE ONYX 2.75X26.  Post intervention, there is a 0% residual stenosis.  Post intervention, there is a 0% residual stenosis.  Prox RCA lesion is 70% stenosed.  Post intervention, there is a 0% residual stenosis.  Respiratory distress and cardiac shock secondary to ST segment elevation anterior wall myocardial infarction.  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 The RCA has 70% stenosis in its proximal segment. There is faint collateralization to small calibered distal LAD and diagonal vessels arising from the right and circumflex vessels.  Total proximal occlusion of the LAD with TIMI 0 flow.  Difficult but successful intervention to the LAD PTCA and stenting of the proximal LAD, PTCA of the ostium of the diagonal vessel and PTCA of the mid LAD with ultimate insertion of a 2.75 x 26 mm Resolute Onyx stent of the proximal LAD to just before the diagonal bifurcation. Mid distal LAD and diagonal vessels are very small caliber.  Right heart catheterization demonstrated RV volume overload. Cardiogenic shock requiring insertion of an Impella CR regular assist device.  RECOMMENDATION: The patient ultimately stabilized his hemodynamics and was transported to the coronary care unit on a levophed an epinephrine drip in addition to Aggrastat, Angiomax, cangrelor, propofol. Brilinta will be inserted via OG in the CCU and cangrelor will subsequently be discontinued. Plan for echo Doppler study.      Recent Labs: 03/31/2019: B Natriuretic Peptide 1,247.4; TSH 0.887 04/02/2019: ALT 18 04/16/2019: Hemoglobin 10.0; Magnesium 1.8; Platelets 670 04/25/2019: BUN 17; Creatinine, Ser 0.90; Potassium 4.7; Sodium 136  03/31/2019: Cholesterol 253; HDL 46; LDL Cholesterol 188; Total CHOL/HDL Ratio 5.5; VLDL 19 04/12/2019: Triglycerides 99   Estimated Creatinine Clearance: 85.1 mL/min (by C-G formula based on SCr of 0.9 mg/dL).   Wt Readings from Last 3 Encounters:  04/26/19 169 lb (76.7 kg)  04/25/19 172 lb 6.4 oz (78.2 kg)  04/16/19 173 lb 15.1 oz (78.9 kg)     Other studies reviewed: Additional studies/records reviewed today include: summarized above  ASSESSMENT AND PLAN:  1. ICD, 2/2 prevention    Site looks great, no evidence of infection    shock plan was reviewed with the patient and his wife and provided fridge magnet and waller card    Reminded no driving 104mo   2. CAD 3. ICM     No exam findings or symptoms of fluid OL.  CorVue not yet trending     Pending f/u with Dr. Haroldine Laws   4. He has not picked smoking back up     Congratulated and encouraged   Disposition: F/u with Dr. Curt Bears in Nov as scheduled, Q 3 mo remotes.  Current medicines are reviewed at length with the patient today.  The patient did not have any concerns regarding medicines.  Venetia Night, [name] 04/26/2019 10:07 AM     [address] [place]  [place] [address] [phone] (office)  [phone] (fax)

## 2019-04-25 NOTE — Patient Instructions (Signed)
INCREASE Losartan to 25 mg, one tab daily at bedtime  Labs today We will only contact you if something comes back abnormal or we need to make some changes. Otherwise no news is good news!  Your physician recommends that you schedule a follow-up appointment in: 3-4 weeks with Dr. Haroldine Laws  Do the following things EVERYDAY: 1) Weigh yourself in the morning before breakfast. Write it down and keep it in a log. 2) Take your medicines as prescribed 3) Eat low salt foods-Limit salt (sodium) to 2000 mg per day.  4) Stay as active as you can everyday 5) Limit all fluids for the day to less than 2 liters  At the [facility], you and your health needs are our priority. As part of our continuing mission to provide you with exceptional heart care, we have created designated Provider Care Teams. These Care Teams include your primary Cardiologist (physician) and Advanced Practice Providers (APPs- Physician Assistants and Nurse Practitioners) who all work together to provide you with the care you need, when you need it.   You may see any of the following providers on your designated Care Team at your next follow up: Marland Kitchen Dr [name] . Dr Loralie Champagne . Darrick Grinder, NP   Please be sure to bring in all your medications bottles to every appointment.

## 2019-04-25 NOTE — Progress Notes (Signed)
Advanced Heart Failure Clinic Note   04/25/2019 Mackinley Bullen   1955-10-19  [id number]  Primary Physician Patient, No Pcp Per Primary Cardiologist: Dr. Haroldine Laws Electrophysiologist: Dr. Curt Bears  Reason for Visit/CC: [facility] F/u s/p STEMI c/b Cardiogenic Shock   HPI:  Jonny Spragg is a 63 y.o. male who is being seen today for post hospital f/u after admission for acute anterior STEMI c/b cardiogenic shock. Prior to admit, his only other previously diagnosed medical problem was COPD.   He was brought to the [facility] on 03/31/19 by EMS for respiratory failure/arrest. Intubated in ER. Initial ECG with anterior ST elevation. Intubated and transported to cath lab emergently. Cath by Dr. Claiborne Billings with 3v CAD and totally occluded prox LAD. LCX anomalous off RCA. Underwent PCI of LAD which was diffusely diseased vessel. Started on DAPT w/ ASA + Brilinta and high dose statin. He developed cardiogenic shock started on NE 20 and Impella placed. Advanced HF team was consulted to assist with management. He required pressor and inotropic support. Required milrinone. Echo showed severely reduced LV systolic function w/ EF at 20-25%, septal apical and anterior wall hypokinesis and moderate RV systolic function.  He was weaned off of pressors and inotropes. Hospital course complicated by Torsades. Started on amiodarone. EP consulted placed ICD prior to discharge. Received Saint Jude dual-chamber defibrillator. Lipid panel showed elevated LDL at 188 mg/dL>>>atorvastatin 80 mg added. Hgb A1c 6.0. Started on guidelines directed therapy. Did not tolerate Entresto due to low BP. Changed to low dose losartan. Spironolactone, Coreg and digoxin also added. He was discharged home on Brilinta, but may need to switch to Plavix if cost issues.   He presents to the [facility] today for post hospital f/u. Here with his wife. Doing well. No complaints. Denies CP and dyspnea. No exertional symptoms. No LEE, orthopnea or PND.  Denies  palpitations. No ICD shocks. Device pocket well healed. Reports full med compliance. Tolerating well w/o side effects. He quit smoking. Had changed diet. Cut out fried foods and limiting salt intake. He was able to get all of his meds at d/c for $25 but concerned about ability to afford future refills.     Cardiac Studies   [facility] 03/31/19 Procedures  Coronary/Graft Acute MI Revascularization  LEFT HEART CATH AND CORONARY ANGIOGRAPHY  RIGHT HEART CATH  VENTRICULAR ASSIST DEVICE INSERTION  Conclusion    Mid LM to Prox LAD lesion is 100% stenosed.  Mid LAD lesion is 100% stenosed.  A drug-eluting stent was successfully placed using a STENT RESOLUTE ONYX 2.75X26.  Post intervention, there is a 0% residual stenosis.  Post intervention, there is a 0% residual stenosis.  Prox RCA lesion is 70% stenosed.  Post intervention, there is a 0% residual stenosis.     Right Heart  Right Heart Pressures Initial LV pressure 88/30  RA: A-wave 19, V wave 19, mean 17 RV: 37/20 PA: 38/23 PW: A-wave 30, V wave 25; mean 21  The patient's central aortic pressure decreased to 68/48.  Oxygen saturation and PA 51% and AO 100%.  By the Fick method, cardiac output 2.3 L/min with a cardiac index of 1.2 L/min/m.  SVR 17.52 PVR 2.56 WU      Coronary Diagrams  Diagnostic Dominance: Right  Intervention     2D Echo 03/31/19 1. The left ventricle has severely reduced systolic function, with an ejection fraction of 20-25%. The cavity size was moderately dilated. Left ventricular diastolic function could not be evaluated. 2. Septal apical and  anterior wall hyppkinesis Impella device seen in LV extending to mid ventrical CW through AV suggests low output. 3. The right ventricle has moderately reduced systolic function. The cavity was mildly enlarged. There is no increase in right ventricular wall thickness. 4. Left atrial size was moderately dilated. 5. Right atrial size was  mildly dilated. 6. The aortic valve was not well visualized. 7. Impella device seen coursing across AV. 8. The aorta is normal in size and structure. 9. The interatrial septum was not well visualized.  Current Meds  Medication Sig   amiodarone (PACERONE) 200 MG tablet Take 1 tablet (200 mg total) by mouth daily.   aspirin 81 MG chewable tablet Chew 1 tablet (81 mg total) by mouth daily.   atorvastatin (LIPITOR) 80 MG tablet Take 1 tablet (80 mg total) by mouth daily at 6 PM.   carvedilol (COREG) 3.125 MG tablet Take 1 tablet (3.125 mg total) by mouth 2 (two) times daily with a meal.   digoxin (LANOXIN) 0.125 MG tablet Take 1 tablet (0.125 mg total) by mouth daily.   losartan (COZAAR) 25 MG tablet Take 0.5 tablets (12.5 mg total) by mouth at bedtime.   metFORMIN (GLUCOPHAGE) 500 MG tablet Take 1 tablet (500 mg total) by mouth 2 (two) times daily with a meal.   spironolactone (ALDACTONE) 25 MG tablet Take 1 tablet (25 mg total) by mouth daily.   ticagrelor (BRILINTA) 90 MG TABS tablet Take 1 tablet (90 mg total) by mouth 2 (two) times daily.   No Known Allergies Past Medical History:  Diagnosis Date   Medical history non-contributory    Family History  Family history unknown: Yes   Past Surgical History:  Procedure Laterality Date   CORONARY/GRAFT ACUTE MI REVASCULARIZATION N/A 03/31/2019   Procedure: Coronary/Graft Acute MI Revascularization;  Surgeon: Troy Sine, MD;  Location: [place] CV LAB;  Service: Cardiovascular;  Laterality: N/A;   ICD IMPLANT N/A 04/15/2019   Procedure: ICD IMPLANT;  Surgeon: Constance Haw, MD;  Location: [place] CV LAB;  Service: Cardiovascular;  Laterality: N/A;   LEFT HEART CATH AND CORONARY ANGIOGRAPHY N/A 03/31/2019   Procedure: LEFT HEART CATH AND CORONARY ANGIOGRAPHY;  Surgeon: Troy Sine, MD;  Location: [place] CV LAB;  Service: Cardiovascular;  Laterality: N/A;   NO PAST SURGERIES     RIGHT HEART CATH N/A  03/31/2019   Procedure: RIGHT HEART CATH;  Surgeon: Troy Sine, MD;  Location: [place] CV LAB;  Service: Cardiovascular;  Laterality: N/A;   VENTRICULAR ASSIST DEVICE INSERTION N/A 03/31/2019   Procedure: VENTRICULAR ASSIST DEVICE INSERTION;  Surgeon: Troy Sine, MD;  Location: [place] CV LAB;  Service: Cardiovascular;  Laterality: N/A;   Social History   Socioeconomic History   Marital status: Single    Spouse name: Not on file   Number of children: Not on file   Years of education: Not on file   Highest education level: Not on file  Occupational History   Not on file  Social Needs   Financial resource strain: Not on file   Food insecurity    Worry: Not on file    Inability: Not on file   Transportation needs    Medical: Not on file    Non-medical: Not on file  Tobacco Use   Smoking status: Former Smoker    Quit date: 04/08/2019    Years since quitting: 0.0   Smokeless tobacco: Never Used  Substance and Sexual Activity   Alcohol  use: No   Drug use: No   Sexual activity: Not on file  Lifestyle   Physical activity    Days per week: Not on file    Minutes per session: Not on file   Stress: Not on file  Relationships   Social connections    Talks on phone: Not on file    Gets together: Not on file    Attends religious service: Not on file    Active member of club or organization: Not on file    Attends meetings of clubs or organizations: Not on file    Relationship status: Not on file   Intimate partner violence    Fear of current or ex partner: Not on file    Emotionally abused: Not on file    Physically abused: Not on file    Forced sexual activity: Not on file  Other Topics Concern   Not on file  Social History Narrative   Not on file     Lipid Panel     Component Value Date/Time   CHOL 253 (H) 03/31/2019 0013   TRIG 99 04/12/2019 0530   HDL 46 03/31/2019 0013   CHOLHDL 5.5 03/31/2019 0013   VLDL 19 03/31/2019 0013    LDLCALC 188 (H) 03/31/2019 0013    Review of Systems: General: negative for chills, fever, night sweats or weight changes.  Cardiovascular: negative for chest pain, dyspnea on exertion, edema, orthopnea, palpitations, paroxysmal nocturnal dyspnea or shortness of breath Dermatological: negative for rash Respiratory: negative for cough or wheezing Urologic: negative for hematuria Abdominal: negative for nausea, vomiting, diarrhea, bright red blood per rectum, melena, or hematemesis Neurologic: negative for visual changes, syncope, or dizziness All other systems reviewed and are otherwise negative except as noted above.   Physical Exam:  Blood pressure 124/64, pulse 61, weight 78.2 kg (172 lb 6.4 oz), SpO2 97 %.  General appearance: alert, cooperative and no distress Neck: no carotid bruit and no JVD Lungs: clear to auscultation bilaterally Heart: regular rate and rhythm, S1, S2 normal, no murmur, click, rub or gallop Abdomen: soft, non-tender; bowel sounds normal; no masses,  no organomegaly Extremities: extremities normal, atraumatic, no cyanosis or edema Pulses: 2+ and symmetric Skin: Skin color, texture, turgor normal. No rashes or lesions Neurologic: Grossly normal  EKG not performed -- personally reviewed   ASSESSMENT AND PLAN:   1. CAD s/p Acute Anterior STEMI c/b Cardiogenic Shock: acute MI 03/31/19 with 3v CAD and totally occluded prox LAD. LCX anomalous off RCA. Underwent PCI of LAD w/ DES which was diffusely diseased vessel. Residual prox-mid 70% RCA, being treated medically.  - Stable w/o angina. No dyspnea or exertional symptoms. -Continue DAPT for a minimum of 12 months, ASA+ Brilinta.  - SW consulted to assist w/ cost of Brilinta. Paperwork provided for financial assistance. If he dose not qualify, will need to change to Plavix.  -Continue high intensity statin (atorvastatin 80) for agressive LDL reduction w/ target < 70 mg and for plaque stabilization given residual RCA  disease -Continue  blocker, Coreg 3.125 BID -Continue ARB, Losartan>>increase dosage to 25 mg daily - Place referral for cardiac rehab  2. Ischemic Cardiomyopathy/ Systolic Heart Failure: EF severely reduced at 20-25% following acute anterior STEMI.  -NYHA Functional Class II -Euvolemic on Exam -He did not tolerate Entresto due to low BP. -tolerating Losartan, BP 124/64>> increase dose to 25 mg daily -Continue Spironolactone 25 mg daily -Continue digoxin -Check BMP today  -We discussed continuation of low sodium  diet and daily weights, call if > 3lb wt gain in 24 hrs or > 5 lb in 1 week  3. S/p Cardiac Arrest/ Torsades: s/p dual chamber ICD (St. Jude) for secondary prevention.  - Device pocket well healed - Denies palpitations. - No ICD shocks - Continue on amiodarone.  - Has EP f/u on 9/4.  - Check BMP today  4. HLD w/ LDL goal < 70 mg/dL: LDL elevated 188 mg/dl. Started on high intensity statin, atorvastatin 80 mg nightly.  -Reports full compliance. No side effects - Will continue atorvastatin 80 mg for LDL reduction and plaque stabilization given moderate, residual RCA disease.   - Will repeat FLP and HFTs in 6 weeks.  - If not at goal in 6 weeks, will add Zetia. Other considerations include addition of PCSK9i therapy if unable to achieve goal.   5. Tobacco Use: long time smoker but recently quit 03/2019 after MI -continues to refrain from use. No cravings. He was congratulated on his efforts and encouraged to continue to refrain from use to reduce risk of recurrent CV events.  6. T2DM:   - Hgb A1c 6.0 - On Metformin - Has f/u w/ [facility] on 9/16 - Consider later addition of SGLT2i  6. Medication Assistance: pt concerned about long term medication cost and financial strain. SW asked to see today for assistance through HF fund   Follow-Up in 3 weeks w/ MD.  Lyda Jester [name], [facility] 04/25/2019 10:28 AM

## 2019-04-26 ENCOUNTER — Ambulatory Visit (INDEPENDENT_AMBULATORY_CARE_PROVIDER_SITE_OTHER): Payer: Self-pay | Admitting: Physician Assistant

## 2019-04-26 ENCOUNTER — Encounter: Payer: Self-pay | Admitting: Physician Assistant

## 2019-04-26 VITALS — BP 122/66 | HR 58 | Ht 69.0 in | Wt 169.0 lb

## 2019-04-26 DIAGNOSIS — I2510 Atherosclerotic heart disease of native coronary artery without angina pectoris: Secondary | ICD-10-CM

## 2019-04-26 DIAGNOSIS — R570 Cardiogenic shock: Secondary | ICD-10-CM

## 2019-04-26 DIAGNOSIS — Z95810 Presence of automatic (implantable) cardiac defibrillator: Secondary | ICD-10-CM

## 2019-04-26 DIAGNOSIS — I2102 ST elevation (STEMI) myocardial infarction involving left anterior descending coronary artery: Secondary | ICD-10-CM

## 2019-04-26 DIAGNOSIS — I255 Ischemic cardiomyopathy: Secondary | ICD-10-CM

## 2019-04-26 NOTE — Patient Instructions (Signed)
Medication Instructions:  Your physician recommends that you continue on your current medications as directed. Please refer to the Current Medication list given to you today.   If you need a refill on your cardiac medications before your next appointment, please call your pharmacy.   Lab work: NONE ORDERED  TODAY   If you have labs (blood work) drawn today and your tests are completely normal, you will receive your results only by: . MyChart Message (if you have MyChart) OR . A paper copy in the mail If you have any lab test that is abnormal or we need to change your treatment, we will call you to review the results.  Testing/Procedures: NONE ORDERED  TODAY  Follow-Up:   AS SCHEDULED   Any Other Special Instructions Will Be Listed Below (If Applicable).     

## 2019-05-07 ENCOUNTER — Other Ambulatory Visit (HOSPITAL_COMMUNITY): Payer: Self-pay | Admitting: *Deleted

## 2019-05-07 ENCOUNTER — Telehealth (HOSPITAL_COMMUNITY): Payer: Self-pay | Admitting: Licensed Clinical Social Worker

## 2019-05-07 MED ORDER — METFORMIN HCL 500 MG PO TABS: 500.0000 mg | ORAL_TABLET | Freq: Two times a day (BID) | ORAL | 0 refills | Status: DC

## 2019-05-07 NOTE — Telephone Encounter (Signed)
CSW received call from pt wife requesting help with getting metformin sent to a new pharmacy so they can pick it up.  CSW had RN send in 30 day supply to [facility] where pt can get it for $4- wife updated.  CSW also called [place] [employer] to check in on application for assistance with Brilinta.  CSW informed that pt is approved to receive the medications for free until 05/01/2020- CSW arranged first shipment- should arrive in approximately 5 business days- CSW called and left message with wife to inform.  CSW will continue to follow and assist as needed  Jorge Ny, [facility] Desk#: [phone] Cell#: [phone]

## 2019-05-07 NOTE — Telephone Encounter (Signed)
Pt is out of Metformin and needs it refilled, he currently does not have a PCP but is sch to see [facility] tomorrow to est care with them.  Advised would send in RX for 1 time and then further refills have to come from them.

## 2019-05-08 ENCOUNTER — Encounter: Payer: Self-pay | Admitting: Critical Care Medicine

## 2019-05-08 ENCOUNTER — Ambulatory Visit: Payer: Medicaid Other | Attending: Critical Care Medicine | Admitting: Critical Care Medicine

## 2019-05-08 ENCOUNTER — Other Ambulatory Visit: Payer: Self-pay

## 2019-05-08 VITALS — BP 131/80 | HR 57 | Temp 98.8°F | Ht 71.0 in | Wt 178.6 lb

## 2019-05-08 DIAGNOSIS — Z1159 Encounter for screening for other viral diseases: Secondary | ICD-10-CM

## 2019-05-08 DIAGNOSIS — I2510 Atherosclerotic heart disease of native coronary artery without angina pectoris: Secondary | ICD-10-CM

## 2019-05-08 DIAGNOSIS — R7303 Prediabetes: Secondary | ICD-10-CM | POA: Diagnosis not present

## 2019-05-08 DIAGNOSIS — E119 Type 2 diabetes mellitus without complications: Secondary | ICD-10-CM | POA: Insufficient documentation

## 2019-05-08 DIAGNOSIS — I2102 ST elevation (STEMI) myocardial infarction involving left anterior descending coronary artery: Secondary | ICD-10-CM

## 2019-05-08 DIAGNOSIS — Z1211 Encounter for screening for malignant neoplasm of colon: Secondary | ICD-10-CM

## 2019-05-08 DIAGNOSIS — E1169 Type 2 diabetes mellitus with other specified complication: Secondary | ICD-10-CM

## 2019-05-08 DIAGNOSIS — Z955 Presence of coronary angioplasty implant and graft: Secondary | ICD-10-CM

## 2019-05-08 DIAGNOSIS — E785 Hyperlipidemia, unspecified: Secondary | ICD-10-CM

## 2019-05-08 DIAGNOSIS — E118 Type 2 diabetes mellitus with unspecified complications: Secondary | ICD-10-CM

## 2019-05-08 DIAGNOSIS — I252 Old myocardial infarction: Secondary | ICD-10-CM

## 2019-05-08 DIAGNOSIS — I255 Ischemic cardiomyopathy: Secondary | ICD-10-CM

## 2019-05-08 LAB — GLUCOSE, POCT (MANUAL RESULT ENTRY): POC Glucose: 128 mg/dl — AB (ref 70–99)

## 2019-05-08 MED ORDER — CLEAR EYES COMPLETE OP SOLN: 1.0000 [drp] | Freq: Three times a day (TID) | OPHTHALMIC | 1 refills | Status: DC

## 2019-05-08 MED ORDER — TRUEPLUS LANCETS 28G MISC: 1 refills | Status: DC

## 2019-05-08 MED ORDER — TRUE METRIX BLOOD GLUCOSE TEST VI STRP: ORAL_STRIP | 12 refills | Status: DC

## 2019-05-08 MED ORDER — TRUE METRIX METER W/DEVICE KIT: PACK | 0 refills | Status: DC

## 2019-05-08 NOTE — Assessment & Plan Note (Signed)
Ischemic cardiomyopathy with improvement on current program of Coreg, digoxin, amiodarone, losartan, and spironolactone  Follow-up per cardiology

## 2019-05-08 NOTE — Progress Notes (Signed)
Subjective:    Patient ID: Stephen Jennings, male    DOB: [birth year], 63 y.o.   MRN: [id number]  This is a 63 year old male who underwent an acute cardiac event with a STEMI August 9.  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  The cath was complicated by hypotension and shock requiring vasopressors Impella placement and intubation.  Below is the discharge summary from that admission  He was admitted to [facility] with STEMI 03/31/2019, he underwent emergent cath withtotally occluded prox LAD. LCX anomalous off RCA. Underwent PCI of LAD which was diffusely diseased vessel, his cath complicated by shock requiring pressors, Impella and was intubated  He was extubated 04/05/2019, early morning of 04/06/2019 he had cardiac arrest, code note mentionspulseless V-tach *tele reviewed was PMVT) and returned to sinus with 53mepi x2 and 2 cardioversionand was poorly responsive immediately afterwards and re-intubate, K+ was 3.7, 3.8, Mag 1.8, He had 1u PRBC for hgb 7.2  He underwent SJM dual chamber ICD implant 04/15/2019. Discharged 04/16/2019   He has done well since discharge, no CP, palpitations, no dizzy spells, near syncope or syncope.  No SOB or DOE, he denies symptoms of PND or orthopnea as well.  He has kept off the cigarettes !!   Device information: SJM dual chamber ICD, implanted 04/15/2019, secondary prevention   Primary Discharge Diagnoses:  1. Cardiac arrest/VT/torsades on 8/15 2. Acute systolic HF -> Cardiogenic shock due to acute anterior MI 3. CAD with acute anterior MI 8/9 4. Acute hypoxic respiratory failure 5.  DM2 6. COPD 7. NSVT/Torsades 8.  Anemia, acute blood loss 9.  Delirium   Stephen Jennings a 63y/o male with h/o COPD. No known cardiac history. Brought to ER for respiratory failure/arrest. Intubated in  ER. Initial ECG with anterior ST elevation. Intubated and transported to cath lab emergently. Cath by Dr. KClaiborne Billingswith 3v CAD and totally occluded prox LAD. LCX anomalous off RCA. Underwent PCI of LAD which was diffusely diseased vessel. Developed cardiogenic shock requiring pressors and mechanical support with impella. Hospital course complicated by Torsades. EP consulted placed ICD prior to discharge.   HF medications provided via HF fund. Plan to follow closely in the HF clinic. [facility] will follow [facility]  Please see below for detailed problem list.  Hospital Course:  1. Cardiac arrest/VT/torsades on 8/15 - Loaded on amio . Rhythm stable on IV amio - EP placed ICD 8/24  2. Acute systolic HF -> Cardiogenic shock due to acute anterior MI - s/p PCI/DES to LAD on 8/9 - Required mechanical support with Impella, This was removed 04/03/19 - Continue digoxin 0.125  - Continue spiro to 25 mg daily.  - He did not tolerate Entresto due to low BP.Placed onlosartan 12.5 qhs. BP improved - Continue low dose carvedilol. - 03/31/2019 Echo 20-25%  3. CAD with acute anterior MI 8/9 - s/p PCI/DES to LAD. 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 On Asa+ brillinta. Suspect he will not be able to afford brilliant anticipate switching due to cost. Switch to  plavix 300 mg load then 75 mg daily.   4. Acute hypoxic respiratory failure - in setting of #1 - extubated 8/14. Reintubated 8/15 in setting of torsades. Extubated 8/16.  -As he improved  he was stable on room air.   4. DM2 - new diagnosis - Diabetes Coordinators consulted. Placed on metformin 500 mg twice a day. He will follow up at [place].  Hgb A1C 6.   5. COPD - ongoing tobacco use - needs cessation   6. NSVT/Torsades - Has had >10 gram load amio.  - Now on po amio 200 daily. Continue amio 200 mg daily - Keep K> 4.0 Mg > 2.0 - EP  appreciated.  -S/P ICD  7. Anemia, acute blood loss - Got 1u RBC on 8/15.   - Hgb stable the day discharge.    8. Delirium  - resolved with brief Klonopin + seroquel.   Since discharge the patient's been doing well.  He is no longer smoking.  He is less short of breath and not having active chest pain at this time.  He was found to be diagnosed with diabetes and is on the metformin 500 mg twice a day but he does not have a glucose meter at home at this time.  The patient also has been placed on amiodarone 200 mg daily he is achieving all of his medicines including the Brilinta from the cardiology office with patient assistance.  He gets all of his medicines at the [facility] outpatient pharmacy.  He had delirium in the hospital but this is completely resolved and he is off medications for this as well.  His weight has gone up a little bit since discharge.  He has had 1 visit with cardiology prior to this visit.  He is here to establish for primary care in [facility].  The patient denies any lower extremity edema SINCE dc is ok.   No Cigs!!!     Wt Readings from Last 3 Encounters: 05/08/19 : 178 lb 9.6 oz (81 kg) 04/26/19 : 169 lb (76.7 kg) 04/25/19 : 172 lb 6.4 oz (78.2 kg)  Past Medical History:  Diagnosis Date  . Cardiogenic shock ([place])   . Medical history non-contributory      Family History  Family history unknown: Yes     Social History   Socioeconomic History  . Marital status: Single    Spouse name: Not on file  . Number of children: Not on file  . Years of education: Not on file  . Highest education level: Not on file  Occupational History  . Not on file  Social Needs  . Financial resource strain: Not on file  . Food insecurity    Worry: Not on file    Inability: Not on file  . Transportation needs    Medical: Not on file    Non-medical: Not on file  Tobacco Use  . Smoking status: Former Smoker    Quit date: 04/08/2019    Years  since quitting: 0.0  . Smokeless tobacco: Never Used  Substance and Sexual Activity  . Alcohol use: No  . Drug use: No  . Sexual activity: Not on file  Lifestyle  . Physical activity    Days per week: Not on file    Minutes per session: Not on file  . Stress: Not on file  Relationships  . Social Herbalist on phone: Not on file    Gets together: Not on file    Attends religious service: Not on file    Active member of club or organization: Not on file    Attends meetings of clubs or organizations: Not  on file    Relationship status: Not on file  . Intimate partner violence    Fear of current or ex partner: Not on file    Emotionally abused: Not on file    Physically abused: Not on file    Forced sexual activity: Not on file  Other Topics Concern  . Not on file  Social History Narrative  . Not on file     No Known Allergies   Outpatient Medications Prior to Visit  Medication Sig Dispense Refill  . amiodarone (PACERONE) 200 MG tablet Take 1 tablet (200 mg total) by mouth daily. Patient is in the HF Assistance Program. 30 tablet 6  . aspirin 81 MG chewable tablet Chew 1 tablet (81 mg total) by mouth daily. 30 tablet 2  . atorvastatin (LIPITOR) 80 MG tablet Take 1 tablet (80 mg total) by mouth daily at 6 PM. 30 tablet 2  . carvedilol (COREG) 3.125 MG tablet Take 1 tablet (3.125 mg total) by mouth 2 (two) times daily with a meal. 60 tablet 2  . digoxin (LANOXIN) 0.125 MG tablet Take 1 tablet (0.125 mg total) by mouth daily. 30 tablet 2  . losartan (COZAAR) 25 MG tablet Take 1 tablet (25 mg total) by mouth at bedtime. 30 tablet 2  . metFORMIN (GLUCOPHAGE) 500 MG tablet Take 1 tablet (500 mg total) by mouth 2 (two) times daily with a meal. 60 tablet 0  . spironolactone (ALDACTONE) 25 MG tablet Take 1 tablet (25 mg total) by mouth daily. 30 tablet 2  . ticagrelor (BRILINTA) 90 MG TABS tablet Take 1 tablet (90 mg total) by mouth 2 (two) times daily. 60 tablet 2  .  Naphazoline HCl (CLEAR EYES OP) Place 1 drop into both eyes daily as needed (dry eyes/ irritation).    . nitroGLYCERIN (NITROSTAT) 0.4 MG SL tablet Place 1 tablet (0.4 mg total) under the tongue every 5 (five) minutes x 3 doses as needed for chest pain. (Patient not taking: Reported on 04/25/2019) 25 tablet 12   No facility-administered medications prior to visit.      Review of Systems Constitutional:   No  weight loss, night sweats,  Fevers, chills, fatigue, lassitude. HEENT:   No headaches,  Difficulty swallowing,  Tooth/dental problems,  Sore throat,                No sneezing, itching, ear ache, nasal congestion, post nasal drip,   CV:  No chest pain,  Orthopnea, PND, swelling in lower extremities, anasarca, dizziness, palpitations  GI  No heartburn, indigestion, abdominal pain, nausea, vomiting, diarrhea, change in bowel habits, loss of appetite  Resp: No shortness of breath with exertion or at rest.  No excess mucus, no productive cough,  No non-productive cough,  No coughing up of blood.  No change in color of mucus.  No wheezing.  No chest wall deformity  Skin: no rash or lesions.  GU: no dysuria, change in color of urine, no urgency or frequency.  No flank pain.  MS:  No joint pain or swelling.  No decreased range of motion.  No back pain.  Psych:  No change in mood or affect. No depression or anxiety.  No memory loss.     Objective:   Physical Exam Vitals:   05/08/19 1049  BP: 131/80  Pulse: (!) 57  Temp: 98.8 F (37.1 C)  TempSrc: Oral  SpO2: 94%  Weight: 178 lb 9.6 oz (81 kg)  Height: _0  (1.803 m)  Gen: Pleasant, well-nourished, in no distress,  normal affect  ENT: No lesions,  mouth clear,  oropharynx clear, no postnasal drip  Neck: No JVD, no TMG, no carotid bruits  Lungs: No use of accessory muscles, no dullness to percussion, clear without rales or rhonchi  Cardiovascular: RRR, heart sounds normal, no murmur or gallops, no peripheral edema   Abdomen: soft and NT, no HSM,  BS normal  Musculoskeletal: No deformities, no cyanosis or clubbing  Neuro: alert, non focal  Skin: Warm, no lesions or rashes   BMP Latest Ref Rng & Units 04/25/2019 04/16/2019 04/15/2019  Glucose 70 - 99 mg/dL 132(H) 104(H) 95  BUN 8 - 23 mg/dL _0 Creatinine 0.61 - 1.24 mg/dL 0.90 0.97 0.94  Sodium 135 - 145 mmol/L 136 138 139  Potassium 3.5 - 5.1 mmol/L 4.7 4.1 4.3  Chloride 98 - 111 mmol/L 100 104 105  CO2 22 - 32 mmol/L _1 Calcium 8.9 - 10.3 mg/dL 9.5 8.6(L) 8.7(L)   CBC Latest Ref Rng & Units 04/16/2019 04/15/2019 04/14/2019  WBC 4.0 - 10.5 K/uL 7.7 7.9 7.7  Hemoglobin 13.0 - 17.0 g/dL 10.0(L) 9.2(L) 9.3(L)  Hematocrit 39.0 - 52.0 % 30.9(L) 29.1(L) 30.1(L)  Platelets 150 - 400 K/uL 670(H) 668(H) 672(H)   Hepatic Function Latest Ref Rng & Units 04/02/2019 04/01/2019 03/31/2019  Total Protein 6.5 - 8.1 g/dL 4.9(L) 4.3(L) 4.5(L)  Albumin 3.5 - 5.0 g/dL 2.3(L) 2.4(L) 2.4(L)  AST 15 - 41 U/L 47(H) 79(H) 55(H)  ALT 0 - 44 U/L _2 Alk Phosphatase 38 - 126 U/L 52 60 70  Total Bilirubin 0.3 - 1.2 mg/dL 0.9 1.3(H) 0.8  Bilirubin, Direct 0.0 - 0.2 mg/dL 0.2 0.3(H) 0.3(H)         Assessment & Plan:  I personally reviewed all images and lab data in the [facility] system as well as any outside material available during this office visit and agree with the  radiology impressions.   Coronary artery disease involving left main coronary artery History of coronary artery disease involving left main coronary artery with acute STEMI and severe ischemic cardiomyopathy with acute respiratory failure on vent support with shock both of which have resolved  There is a drug-eluting stents to the patient will maintain Brilinta and aspirin 81 mg daily and the patient is receiving the Brilinta through patient assistance via the advanced heart failure clinic  Ischemic cardiomyopathy Ischemic cardiomyopathy with improvement on current program of Coreg, digoxin,  amiodarone, losartan, and spironolactone  Follow-up per cardiology  History of placement of stent in LAD coronary artery As noted above  History of ST elevation myocardial infarction (STEMI) History of anterior wall acute myocardial infarction now stabilizing with cardiology support  Type 2 diabetes mellitus with complications ([place]) Type 2 diabetes hemoglobin A1c of 6.6 with coronary artery disease and hyperlipidemia  We will continue metformin 500 mg twice daily and obtain for the patient glucose testing supplies  Hyperlipidemia associated with type 2 diabetes mellitus ([place]) Hyperlipidemia associated with type 2 diabetes  We will continue atorvastatin at 80 mg daily   Amaziah was seen today for hospitalization follow-up.  Diagnoses and all orders for this visit:  Prediabetes -     Glucose (CBG) -     Comprehensive metabolic panel  Need for hepatitis C screening test -     Hepatitis c antibody (reflex)  Colon cancer screening -     Fecal occult blood, imunochemical  ST elevation myocardial  infarction involving left anterior descending (LAD) coronary artery (HCC) -     Comprehensive metabolic panel -     CBC with Differential/Platelet; Future  History of ST elevation myocardial infarction (STEMI)  Ischemic cardiomyopathy  Coronary artery disease involving left main coronary artery  History of placement of stent in LAD coronary artery  Type 2 diabetes mellitus with complications (HCC)  Hyperlipidemia associated with type 2 diabetes mellitus ([place])  Other orders -     Hyprom-Naphaz-Polysorb-Zn Sulf (CLEAR EYES COMPLETE) SOLN; Apply 1 drop to eye 3 (three) times daily. -     Blood Glucose Monitoring Suppl (TRUE METRIX METER) w/Device KIT; Use to measure blood sugar twice a day -     TRUEplus Lancets 28G MISC; Use to measure blood sugar twice a day -     glucose blood (TRUE METRIX BLOOD GLUCOSE TEST) test strip; Use as instructed   Will administer flu vaccine today  in the office obtain hepatitis C and colon cancer screening

## 2019-05-08 NOTE — Assessment & Plan Note (Signed)
Hyperlipidemia associated with type 2 diabetes  We will continue atorvastatin at 80 mg daily

## 2019-05-08 NOTE — Assessment & Plan Note (Addendum)
History of coronary artery disease involving left main coronary artery with acute STEMI and severe ischemic cardiomyopathy with acute respiratory failure on vent support with shock both of which have resolved  There is a drug-eluting stents to the patient will maintain Brilinta and aspirin 81 mg daily and the patient is receiving the Brilinta through patient assistance via the advanced heart failure clinic

## 2019-05-08 NOTE — Assessment & Plan Note (Signed)
History of anterior wall acute myocardial infarction now stabilizing with cardiology support

## 2019-05-08 NOTE — Assessment & Plan Note (Signed)
As noted above 

## 2019-05-08 NOTE — Assessment & Plan Note (Signed)
Type 2 diabetes hemoglobin A1c of 6.6 with coronary artery disease and hyperlipidemia  We will continue metformin 500 mg twice daily and obtain for the patient glucose testing supplies

## 2019-05-08 NOTE — Patient Instructions (Signed)
Your eyedrops were sent to your [place] today include a complete metabolic panel blood count and hepatitis C screen  Stool card and kit will be given to you to screen for colon cancer  Flu vaccine and tetanus vaccine were given  A glucose meter and supplies was sent to the [facility] outpatient pharmacy  Follow-up again with Dr. Joya Gaskins in 1 month

## 2019-05-09 LAB — COMPREHENSIVE METABOLIC PANEL
ALT: 39 IU/L (ref 0–44)
AST: 28 IU/L (ref 0–40)
Albumin/Globulin Ratio: 2 (ref 1.2–2.2)
Albumin: 4.5 g/dL (ref 3.8–4.8)
Alkaline Phosphatase: 148 IU/L — ABNORMAL HIGH (ref 39–117)
BUN/Creatinine Ratio: 16 (ref 10–24)
BUN: 14 mg/dL (ref 8–27)
Bilirubin Total: 0.4 mg/dL (ref 0.0–1.2)
CO2: 25 mmol/L (ref 20–29)
Calcium: 9.4 mg/dL (ref 8.6–10.2)
Chloride: 102 mmol/L (ref 96–106)
Creatinine, Ser: 0.86 mg/dL (ref 0.76–1.27)
GFR calc Af Amer: 107 mL/min/{1.73_m2} (ref >59)
GFR calc non Af Amer: 93 mL/min/{1.73_m2} (ref >59)
Globulin, Total: 2.2 g/dL (ref 1.5–4.5)
Glucose: 73 mg/dL (ref 65–99)
Potassium: 4.4 mmol/L (ref 3.5–5.2)
Sodium: 141 mmol/L (ref 134–144)
Total Protein: 6.7 g/dL (ref 6.0–8.5)

## 2019-05-09 LAB — HEPATITIS C ANTIBODY (REFLEX): HCV Ab: <0.1 s/co ratio (ref 0.0–0.9)

## 2019-05-09 LAB — HCV COMMENT:

## 2019-05-16 ENCOUNTER — Encounter (HOSPITAL_COMMUNITY): Payer: Self-pay | Admitting: Internal Medicine

## 2019-05-23 MED FILL — DIGOXIN 0.125 MG TABLET: 125 | 30 days supply | Qty: 30 | Fill #1

## 2019-05-23 MED FILL — ASPIRIN CHILD 81 MG TAB CHE: 81 | 30 days supply | Qty: 30 | Fill #1

## 2019-05-23 MED FILL — SPIRONOLACTONE 25 MG TABLET: 25 | 30 days supply | Qty: 30 | Fill #1

## 2019-05-23 MED FILL — LOSARTAN POTASSIUM 25 MG TA: 25 | 30 days supply | Qty: 30 | Fill #1

## 2019-05-23 MED FILL — AMIODARONE HCL 200 MG TAB: 200 | 30 days supply | Qty: 30 | Fill #1

## 2019-05-23 MED FILL — ATORVASTATIN 80 MG TABLET: 80 | 30 days supply | Qty: 30 | Fill #1

## 2019-05-23 MED FILL — CARVEDILOL 3.125 MG TABLET: 3.125 | 30 days supply | Qty: 60 | Fill #1

## 2019-05-25 NOTE — Progress Notes (Signed)
Entered chart for [employer].

## 2019-06-05 ENCOUNTER — Other Ambulatory Visit: Payer: Self-pay

## 2019-06-05 ENCOUNTER — Encounter: Payer: Self-pay | Admitting: Critical Care Medicine

## 2019-06-05 ENCOUNTER — Ambulatory Visit: Payer: Medicaid Other | Attending: Critical Care Medicine | Admitting: Critical Care Medicine

## 2019-06-05 ENCOUNTER — Ambulatory Visit (HOSPITAL_BASED_OUTPATIENT_CLINIC_OR_DEPARTMENT_OTHER): Payer: Medicaid Other | Admitting: Pharmacist

## 2019-06-05 VITALS — BP 163/85 | HR 62 | Temp 98.0°F | Ht 71.0 in | Wt 183.0 lb

## 2019-06-05 DIAGNOSIS — E1165 Type 2 diabetes mellitus with hyperglycemia: Secondary | ICD-10-CM | POA: Diagnosis not present

## 2019-06-05 DIAGNOSIS — I255 Ischemic cardiomyopathy: Secondary | ICD-10-CM

## 2019-06-05 DIAGNOSIS — R7303 Prediabetes: Secondary | ICD-10-CM | POA: Diagnosis not present

## 2019-06-05 DIAGNOSIS — I252 Old myocardial infarction: Secondary | ICD-10-CM

## 2019-06-05 DIAGNOSIS — E1169 Type 2 diabetes mellitus with other specified complication: Secondary | ICD-10-CM

## 2019-06-05 DIAGNOSIS — E785 Hyperlipidemia, unspecified: Secondary | ICD-10-CM

## 2019-06-05 DIAGNOSIS — Z23 Encounter for immunization: Secondary | ICD-10-CM

## 2019-06-05 DIAGNOSIS — E118 Type 2 diabetes mellitus with unspecified complications: Secondary | ICD-10-CM

## 2019-06-05 LAB — GLUCOSE, POCT (MANUAL RESULT ENTRY): POC Glucose: 124 mg/dl — AB (ref 70–99)

## 2019-06-05 NOTE — Patient Instructions (Signed)
No change in medications at this time  Please follow diabetic diet and hypertension diet as outlined below  Return in follow-up in 4 months to establish with primary care at this clinic   Hypertension, Adult Hypertension is another name for high blood pressure. High blood pressure forces your heart to work harder to pump blood. This can cause problems over time. There are two numbers in a blood pressure reading. There is a top number (systolic) over a bottom number (diastolic). It is best to have a blood pressure that is below 120/80. Healthy choices can help lower your blood pressure, or you may need medicine to help lower it. What are the causes? The cause of this condition is not known. Some conditions may be related to high blood pressure. What increases the risk?  Smoking.  Having type 2 diabetes mellitus, high cholesterol, or both.  Not getting enough exercise or physical activity.  Being overweight.  Having too much fat, sugar, calories, or salt (sodium) in your diet.  Drinking too much alcohol.  Having long-term (chronic) kidney disease.  Having a family history of high blood pressure.  Age. Risk increases with age.  Race. You may be at higher risk if you are African American.  Gender. Men are at higher risk than women before age 72. After age 100, women are at higher risk than men.  Having obstructive sleep apnea.  Stress. What are the signs or symptoms?  High blood pressure may not cause symptoms. Very high blood pressure (hypertensive crisis) may cause: ? Headache. ? Feelings of worry or nervousness (anxiety). ? Shortness of breath. ? Nosebleed. ? A feeling of being sick to your stomach (nausea). ? Throwing up (vomiting). ? Changes in how you see. ? Very bad chest pain. ? Seizures. How is this treated?  This condition is treated by making healthy lifestyle changes, such as: ? Eating healthy foods. ? Exercising more. ? Drinking less alcohol.  Your  health care provider may prescribe medicine if lifestyle changes are not enough to get your blood pressure under control, and if: ? Your top number is above 130. ? Your bottom number is above 80.  Your personal target blood pressure may vary. Follow these instructions at home: Eating and drinking   If told, follow the DASH eating plan. To follow this plan: ? Fill one half of your plate at each meal with fruits and vegetables. ? Fill one fourth of your plate at each meal with whole grains. Whole grains include whole-wheat pasta, brown rice, and whole-grain bread. ? Eat or drink low-fat dairy products, such as skim milk or low-fat yogurt. ? Fill one fourth of your plate at each meal with low-fat (lean) proteins. Low-fat proteins include fish, chicken without skin, eggs, beans, and tofu. ? Avoid fatty meat, cured and processed meat, or chicken with skin. ? Avoid pre-made or processed food.  Eat less than 1,500 mg of salt each day.  Do not drink alcohol if: ? Your doctor tells you not to drink. ? You are pregnant, may be pregnant, or are planning to become pregnant.  If you drink alcohol: ? Limit how much you use to:  0-1 drink a day for women.  0-2 drinks a day for men. ? Be aware of how much alcohol is in your drink. In the [place] one drink equals one 12 oz bottle of beer (355 mL), one 5 oz glass of wine (148 mL), or one 1 oz glass of hard liquor (44 mL). Lifestyle  Work with your doctor to stay at a healthy weight or to lose weight. Ask your doctor what the best weight is for you.  Get at least 30 minutes of exercise most days of the week. This may include walking, swimming, or biking.  Get at least 30 minutes of exercise that strengthens your muscles (resistance exercise) at least 3 days a week. This may include lifting weights or doing Pilates.  Do not use any products that contain nicotine or tobacco, such as cigarettes, e-cigarettes, and chewing tobacco. If you need help  quitting, ask your doctor.  Check your blood pressure at home as told by your doctor.  Keep all follow-up visits as told by your doctor. This is important. Medicines  Take over-the-counter and prescription medicines only as told by your doctor. Follow directions carefully.  Do not skip doses of blood pressure medicine. The medicine does not work as well if you skip doses. Skipping doses also puts you at risk for problems.  Ask your doctor about side effects or reactions to medicines that you should watch for. Contact a doctor if you:  Think you are having a reaction to the medicine you are taking.  Have headaches that keep coming back (recurring).  Feel dizzy.  Have swelling in your ankles.  Have trouble with your vision. Get help right away if you:  Get a very bad headache.  Start to feel mixed up (confused).  Feel weak or numb.  Feel faint.  Have very bad pain in your: ? Chest. ? Belly (abdomen).  Throw up more than once.  Have trouble breathing. Summary  Hypertension is another name for high blood pressure.  High blood pressure forces your heart to work harder to pump blood.  For most people, a normal blood pressure is less than 120/80.  Making healthy choices can help lower blood pressure. If your blood pressure does not get lower with healthy choices, you may need to take medicine. This information is not intended to replace advice given to you by your health care provider. Make sure you discuss any questions you have with your health care provider. Document Released: 01/25/2008 Document Revised: 04/18/2018 Document Reviewed: 04/18/2018 Elsevier Patient Education  [place].  Diabetes Mellitus and Nutrition, Adult When you have diabetes (diabetes mellitus), it is very important to have healthy eating habits because your blood sugar (glucose) levels are greatly affected by what you eat and drink. Eating healthy foods in the appropriate amounts, at  about the same times every day, can help you:  Control your blood glucose.  Lower your risk of heart disease.  Improve your blood pressure.  Reach or maintain a healthy weight. Every person with diabetes is different, and each person has different needs for a meal plan. Your health care provider may recommend that you work with a diet and nutrition specialist (dietitian) to make a meal plan that is best for you. Your meal plan may vary depending on factors such as:  The calories you need.  The medicines you take.  Your weight.  Your blood glucose, blood pressure, and cholesterol levels.  Your activity level.  Other health conditions you have, such as heart or kidney disease. How do carbohydrates affect me? Carbohydrates, also called carbs, affect your blood glucose level more than any other type of food. Eating carbs naturally raises the amount of glucose in your blood. Carb counting is a method for keeping track of how many carbs you eat. Counting carbs is important to  keep your blood glucose at a healthy level, especially if you use insulin or take certain oral diabetes medicines. It is important to know how many carbs you can safely have in each meal. This is different for every person. Your dietitian can help you calculate how many carbs you should have at each meal and for each snack. Foods that contain carbs include:  Bread, cereal, rice, pasta, and crackers.  Potatoes and corn.  Peas, beans, and lentils.  Milk and yogurt.  Fruit and juice.  Desserts, such as cakes, cookies, ice cream, and candy. How does alcohol affect me? Alcohol can cause a sudden decrease in blood glucose (hypoglycemia), especially if you use insulin or take certain oral diabetes medicines. Hypoglycemia can be a life-threatening condition. Symptoms of hypoglycemia (sleepiness, dizziness, and confusion) are similar to symptoms of having too much alcohol. If your health care provider says that alcohol  is safe for you, follow these guidelines:  Limit alcohol intake to no more than 1 drink per day for nonpregnant women and 2 drinks per day for men. One drink equals 12 oz of beer, 5 oz of wine, or 1 oz of hard liquor.  Do not drink on an empty stomach.  Keep yourself hydrated with water, diet soda, or unsweetened iced tea.  Keep in mind that regular soda, juice, and other mixers may contain a lot of sugar and must be counted as carbs. What are tips for following this plan?  Reading food labels  Start by checking the serving size on the "Nutrition Facts" label of packaged foods and drinks. The amount of calories, carbs, fats, and other nutrients listed on the label is based on one serving of the item. Many items contain more than one serving per package.  Check the total grams (g) of carbs in one serving. You can calculate the number of servings of carbs in one serving by dividing the total carbs by 15. For example, if a food has 30 g of total carbs, it would be equal to 2 servings of carbs.  Check the number of grams (g) of saturated and trans fats in one serving. Choose foods that have low or no amount of these fats.  Check the number of milligrams (mg) of salt (sodium) in one serving. Most people should limit total sodium intake to less than 2,300 mg per day.  Always check the nutrition information of foods labeled as "low-fat" or "nonfat". These foods may be higher in added sugar or refined carbs and should be avoided.  Talk to your dietitian to identify your daily goals for nutrients listed on the label. Shopping  Avoid buying canned, premade, or processed foods. These foods tend to be high in fat, sodium, and added sugar.  Shop around the outside edge of the grocery store. This includes fresh fruits and vegetables, bulk grains, fresh meats, and fresh dairy. Cooking  Use low-heat cooking methods, such as baking, instead of high-heat cooking methods like deep frying.  Cook using  healthy oils, such as olive, canola, or sunflower oil.  Avoid cooking with butter, cream, or high-fat meats. Meal planning  Eat meals and snacks regularly, preferably at the same times every day. Avoid going long periods of time without eating.  Eat foods high in fiber, such as fresh fruits, vegetables, beans, and whole grains. Talk to your dietitian about how many servings of carbs you can eat at each meal.  Eat 4-6 ounces (oz) of lean protein each day, such as lean meat,  chicken, fish, eggs, or tofu. One oz of lean protein is equal to: ? 1 oz of meat, chicken, or fish. ? 1 egg. ?  cup of tofu.  Eat some foods each day that contain healthy fats, such as avocado, nuts, seeds, and fish. Lifestyle  Check your blood glucose regularly.  Exercise regularly as told by your health care provider. This may include: ? 150 minutes of moderate-intensity or vigorous-intensity exercise each week. This could be brisk walking, biking, or water aerobics. ? Stretching and doing strength exercises, such as yoga or weightlifting, at least 2 times a week.  Take medicines as told by your health care provider.  Do not use any products that contain nicotine or tobacco, such as cigarettes and e-cigarettes. If you need help quitting, ask your health care provider.  Work with a Social worker or diabetes educator to identify strategies to manage stress and any emotional and social challenges. Questions to ask a health care provider  Do I need to meet with a diabetes educator?  Do I need to meet with a dietitian?  What number can I call if I have questions?  When are the best times to check my blood glucose? Where to find more information:  American Diabetes Association: diabetes.org  Academy of Nutrition and Dietetics: www.eatright.[employer] of Diabetes and Digestive and Kidney Diseases (NIH): [employer] Summary  A healthy meal plan will help you control your blood glucose and  maintain a healthy lifestyle.  Working with a diet and nutrition specialist (dietitian) can help you make a meal plan that is best for you.  Keep in mind that carbohydrates (carbs) and alcohol have immediate effects on your blood glucose levels. It is important to count carbs and to use alcohol carefully. This information is not intended to replace advice given to you by your health care provider. Make sure you discuss any questions you have with your health care provider. Document Released: 05/05/2005 Document Revised: 07/21/2017 Document Reviewed: 09/12/2016 Elsevier Patient Education  2020 [employer].

## 2019-06-05 NOTE — Assessment & Plan Note (Signed)
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

## 2019-06-05 NOTE — Progress Notes (Signed)
Patient presents for vaccination against S. pneumoniae per orders of Dr. Margarita Rana. Consent given. Counseling provided. No contraindications exists. Vaccine administered without incident.

## 2019-06-05 NOTE — Progress Notes (Signed)
Pt. Is here for follow up.

## 2019-06-05 NOTE — Assessment & Plan Note (Signed)
Patient will continue atorvastatin at current dose level

## 2019-06-05 NOTE — Progress Notes (Signed)
Subjective:    Patient ID: Stephen Jennings, male    DOB: [birth year], 63 y.o.   MRN: [id number]  This is a 63 year old male who had previous acute STEMI August 9 and underwent intervention to an occluded proximal LAD lesion.  The patient had significant hypotension and shock during the procedure with required Impella placement and intubation.  Below is the discharge summary    Note this patient was previously seen in this clinic and this is a follow-up visit for the patient's type 2 diabetes and hypertension and hyperlipidemia and to establish for primary care  He underwent SJM dual chamber ICD implant 04/15/2019. Discharged 04/16/2019   Primary Discharge Diagnoses:  1. Cardiac arrest/VT/torsades on 8/15 2. Acute systolic HF -> Cardiogenic shock due to acute anterior MI 3. CAD with acute anterior MI 8/9 4. Acute hypoxic respiratory failure 5.  DM2 6. COPD 7. NSVT/Torsades 8.  Anemia, acute blood loss 9.  Delirium   [name] is a 63 y/o male with h/o COPD. No known cardiac history. Brought to ER for respiratory failure/arrest. Intubated in ER. Initial ECG with anterior ST elevation. Intubated and transported to cath lab emergently. Cath by Dr. Claiborne Billings with 3v CAD and totally occluded prox LAD. LCX anomalous off RCA. Underwent PCI of LAD which was diffusely diseased vessel. Developed cardiogenic shock requiring pressors and mechanical support with impella. Hospital course complicated by Torsades. EP consulted placed ICD prior to discharge.   HF medications provided via HF fund. Plan to follow closely in the HF clinic. He will follow up at [facility].  Please see below for detailed problem list.  Hospital Course:  1. Cardiac arrest/VT/torsades on 8/15 - Loaded on amio . Rhythm stable on IV amio - EP placed ICD 8/24  2. Acute systolic HF -> Cardiogenic shock due to acute anterior MI - s/p PCI/DES to LAD on 8/9 - Required mechanical support with Impella, This  was removed 04/03/19 - Continue digoxin 0.125  - Continue spiro to 25 mg daily.  - He did not tolerate Entresto due to low BP.Placed onlosartan 12.5 qhs. BP improved - Continue low dose carvedilol. - 03/31/2019 Echo 20-25%  3. CAD with acute anterior MI 8/9 - s/p PCI/DES to LAD. 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 On Asa+ brillinta. Suspect he will not be able to afford brilliant anticipate switching due to cost. Switch to  plavix 300 mg load then 75 mg daily.   4. Acute hypoxic respiratory failure - in setting of #1 - extubated 8/14. Reintubated 8/15 in setting of torsades. Extubated 8/16.  -As he improved he was stable on room air.   4. DM2 - new diagnosis - Diabetes Coordinators consulted. Placed on metformin 500 mg twice a day. He will follow up at [facility].  Hgb A1C 6.   5. COPD - ongoing tobacco use - needs cessation   6. NSVT/Torsades - Has had >10 gram load amio.  - Now on po amio 200 daily. Continue amio 200 mg daily - Keep K> 4.0 Mg > 2.0 - EP appreciated.  -S/P ICD  7. Anemia, acute blood loss - Got 1u RBC on 8/15.   - Hgb stable the day discharge.    8. Delirium  - resolved with brief Klonopin + seroquel.  The patient notes that his symptoms have improved.  He is no longer having chest pain.  He is no longer smoking.  He has  less shortness of breath.  Note his weight has gone up as noted below.  He is maintaining all of his medications.  He gets these at the [facility] and continues to follow-up with the advanced heart failure clinic.  Wt Readings from Last 3 Encounters: 06/05/19 : 183 lb (83 kg) 05/08/19 : 178 lb 9.6 oz (81 kg) 04/26/19 : 169 lb (76.7 kg)  Note in reviewing his diet he appears to be noncompliant with salt reduction and his diabetic diet he has however not smoking   Past Medical History:  Diagnosis Date  . Cardiogenic shock ([place])    . Medical history non-contributory      Family History  Family history unknown: Yes     Social History   Socioeconomic History  . Marital status: Single    Spouse name: Not on file  . Number of children: Not on file  . Years of education: Not on file  . Highest education level: Not on file  Occupational History  . Not on file  Social Needs  . Financial resource strain: Not on file  . Food insecurity    Worry: Not on file    Inability: Not on file  . Transportation needs    Medical: Not on file    Non-medical: Not on file  Tobacco Use  . Smoking status: Former Smoker    Quit date: 04/08/2019    Years since quitting: 0.1  . Smokeless tobacco: Never Used  Substance and Sexual Activity  . Alcohol use: No  . Drug use: No  . Sexual activity: Not on file  Lifestyle  . Physical activity    Days per week: Not on file    Minutes per session: Not on file  . Stress: Not on file  Relationships  . Social Herbalist on phone: Not on file    Gets together: Not on file    Attends religious service: Not on file    Active member of club or organization: Not on file    Attends meetings of clubs or organizations: Not on file    Relationship status: Not on file  . Intimate partner violence    Fear of current or ex partner: Not on file    Emotionally abused: Not on file    Physically abused: Not on file    Forced sexual activity: Not on file  Other Topics Concern  . Not on file  Social History Narrative  . Not on file     No Known Allergies   Outpatient Medications Prior to Visit  Medication Sig Dispense Refill  . amiodarone (PACERONE) 200 MG tablet Take 1 tablet (200 mg total) by mouth daily. Patient is in the HF Assistance Program. 30 tablet 6  . aspirin 81 MG chewable tablet Chew 1 tablet (81 mg total) by mouth daily. 30 tablet 2  . atorvastatin (LIPITOR) 80 MG tablet Take 1 tablet (80 mg total) by mouth daily at 6 PM. 30 tablet 2  . Blood Glucose Monitoring  Suppl (TRUE METRIX METER) w/Device KIT Use to measure blood sugar twice a day 1 kit 0  . carvedilol (COREG) 3.125 MG tablet Take 1 tablet (3.125 mg total) by mouth 2 (two) times daily with a meal. 60 tablet 2  . digoxin (LANOXIN) 0.125 MG tablet Take 1 tablet (0.125 mg total) by mouth daily. 30 tablet 2  . glucose blood (TRUE METRIX BLOOD GLUCOSE TEST) test strip Use as instructed 100 each 12  .  Hyprom-Naphaz-Polysorb-Zn Sulf (CLEAR EYES COMPLETE) SOLN Apply 1 drop to eye 3 (three) times daily. 15 mL 1  . losartan (COZAAR) 25 MG tablet Take 1 tablet (25 mg total) by mouth at bedtime. 30 tablet 2  . metFORMIN (GLUCOPHAGE) 500 MG tablet Take 1 tablet (500 mg total) by mouth 2 (two) times daily with a meal. 60 tablet 0  . spironolactone (ALDACTONE) 25 MG tablet Take 1 tablet (25 mg total) by mouth daily. 30 tablet 2  . ticagrelor (BRILINTA) 90 MG TABS tablet Take 1 tablet (90 mg total) by mouth 2 (two) times daily. 60 tablet 2  . TRUEplus Lancets 28G MISC Use to measure blood sugar twice a day 100 each 1  . nitroGLYCERIN (NITROSTAT) 0.4 MG SL tablet Place 1 tablet (0.4 mg total) under the tongue every 5 (five) minutes x 3 doses as needed for chest pain. (Patient not taking: Reported on 04/25/2019) 25 tablet 12   No facility-administered medications prior to visit.      Review of Systems Constitutional:   No  weight loss, night sweats,  Fevers, chills, fatigue, lassitude. HEENT:   No headaches,  Difficulty swallowing,  Tooth/dental problems,  Sore throat,                No sneezing, itching, ear ache, nasal congestion, post nasal drip,   CV:  No chest pain,  Orthopnea, PND, swelling in lower extremities, anasarca, dizziness, palpitations  GI  No heartburn, indigestion, abdominal pain, nausea, vomiting, diarrhea, change in bowel habits, loss of appetite  Resp: No shortness of breath with exertion or at rest.  No excess mucus, no productive cough,  No non-productive cough,  No coughing up of blood.   No change in color of mucus.  No wheezing.  No chest wall deformity  Skin: no rash or lesions.  GU: no dysuria, change in color of urine, no urgency or frequency.  No flank pain.  MS:  No joint pain or swelling.  No decreased range of motion.  No back pain.  Psych:  No change in mood or affect. No depression or anxiety.  No memory loss.     Objective:   Physical Exam Vitals:   06/05/19 0857  BP: (!) 163/85  Pulse: 62  Temp: 98 F (36.7 C)  TempSrc: Oral  SpO2: 96%  Weight: 183 lb (83 kg)  Height: 5' 11"  (1.803 m)    Gen: Pleasant, well-nourished, in no distress,  normal affect  ENT: No lesions,  mouth clear,  oropharynx clear, no postnasal drip  Neck: No JVD, no TMG, no carotid bruits  Lungs: No use of accessory muscles, no dullness to percussion, clear without rales or rhonchi  Cardiovascular: RRR, heart sounds normal, no murmur or gallops, no peripheral edema  Abdomen: soft and NT, no HSM,  BS normal  Musculoskeletal: No deformities, no cyanosis or clubbing  Neuro: alert, non focal  Skin: Warm, no lesions or rashes   BMP Latest Ref Rng & Units 05/08/2019 04/25/2019 04/16/2019  Glucose 65 - 99 mg/dL 73 132(H) 104(H)  BUN 8 - 27 mg/dL 14 17 17   Creatinine 0.76 - 1.27 mg/dL 0.86 0.90 0.97  BUN/Creat Ratio 10 - 24 16 - -  Sodium 134 - 144 mmol/L 141 136 138  Potassium 3.5 - 5.2 mmol/L 4.4 4.7 4.1  Chloride 96 - 106 mmol/L 102 100 104  CO2 20 - 29 mmol/L 25 25 23   Calcium 8.6 - 10.2 mg/dL 9.4 9.5 8.6(L)   CBC Latest  Ref Rng & Units 04/16/2019 04/15/2019 04/14/2019  WBC 4.0 - 10.5 K/uL 7.7 7.9 7.7  Hemoglobin 13.0 - 17.0 g/dL 10.0(L) 9.2(L) 9.3(L)  Hematocrit 39.0 - 52.0 % 30.9(L) 29.1(L) 30.1(L)  Platelets 150 - 400 K/uL 670(H) 668(H) 672(H)   Hepatic Function Latest Ref Rng & Units 05/08/2019 04/02/2019 04/01/2019  Total Protein 6.0 - 8.5 g/dL 6.7 4.9(L) 4.3(L)  Albumin 3.8 - 4.8 g/dL 4.5 2.3(L) 2.4(L)  AST 0 - 40 IU/L 28 47(H) 79(H)  ALT 0 - 44 IU/L 39 18 21   Alk Phosphatase 39 - 117 IU/L 148(H) 52 60  Total Bilirubin 0.0 - 1.2 mg/dL 0.4 0.9 1.3(H)  Bilirubin, Direct 0.0 - 0.2 mg/dL - 0.2 0.3(H)   Blood glucose today was 124 in the office      Assessment & Plan:  I personally reviewed all images and lab data in the [facility] system as well as any outside material available during this office visit and agree with the  radiology impressions.   Ischemic cardiomyopathy Ischemic cardiomyopathy with upcoming cardiology appointment pending.  The patient appears to be stable for this time however his weight is up.  However on exam he seems to be euvolemic.  Systolic pressure is elevated today.  He does not appear to be compliant with his low-salt diet.  Plan will be for the review with the patient again dietary needs and restrictions and as well for him to continue his current program of atorvastatin, carvedilol, digoxin, aspirin, Brilinta, amiodarone, and Aldactone as well as losartan  The patient is encouraged to keep his upcoming cardiology visit  Hyperlipidemia associated with type 2 diabetes mellitus ([place]) Patient will continue atorvastatin at current dose level  Type 2 diabetes mellitus with complications ([place]) Type 2 diabetes with complications will continue the metformin at 500 mg twice daily continue blood glucose monitoring   Jamani was seen today for follow-up.  Diagnoses and all orders for this visit:  Prediabetes -     Glucose (CBG)  History of ST elevation myocardial infarction (STEMI)  Ischemic cardiomyopathy  Hyperlipidemia associated with type 2 diabetes mellitus ([place])  Type 2 diabetes mellitus with complications ([place])

## 2019-06-05 NOTE — Assessment & Plan Note (Signed)
Ischemic cardiomyopathy with upcoming cardiology appointment pending.  The patient appears to be stable for this time however his weight is up.  However on exam he seems to be euvolemic.  Systolic pressure is elevated today.  He does not appear to be compliant with his low-salt diet.  Plan will be for the review with the patient again dietary needs and restrictions and as well for him to continue his current program of atorvastatin, carvedilol, digoxin, aspirin, Brilinta, amiodarone, and Aldactone as well as losartan  The patient is encouraged to keep his upcoming cardiology visit

## 2019-06-10 ENCOUNTER — Telehealth: Payer: Self-pay | Admitting: General Practice

## 2019-06-10 MED ORDER — METFORMIN HCL 500 MG PO TABS: 500.0000 mg | ORAL_TABLET | Freq: Two times a day (BID) | ORAL | 2 refills | Status: DC

## 2019-06-10 NOTE — Telephone Encounter (Signed)
New Message  1) Medication(s) Requested (by name): Metformin   2) Pharmacy of Choice: [facility] on [address]  3) Special Requests: Seen Joya Gaskins on 06/05/19   Approved medications will be sent to the pharmacy, we will reach out if there is an issue.  Requests made after 3pm may not be addressed until the following business day!  If a patient is unsure of the name of the medication(s) please note and ask patient to call back when they are able to provide all info, do not send to responsible party until all information is available!

## 2019-06-20 ENCOUNTER — Other Ambulatory Visit: Payer: Self-pay

## 2019-06-20 ENCOUNTER — Encounter (HOSPITAL_COMMUNITY): Payer: Self-pay | Admitting: Internal Medicine

## 2019-06-20 ENCOUNTER — Other Ambulatory Visit (HOSPITAL_COMMUNITY): Payer: Self-pay | Admitting: *Deleted

## 2019-06-20 ENCOUNTER — Ambulatory Visit (HOSPITAL_COMMUNITY)
Admission: RE | Admit: 2019-06-20 | Discharge: 2019-06-20 | Disposition: A | Discharge status: Home / Self Care | Payer: Medicaid Other | Source: Ambulatory Visit | Attending: Internal Medicine | Admitting: Internal Medicine

## 2019-06-20 VITALS — BP 140/90 | HR 58 | Wt 185.0 lb

## 2019-06-20 DIAGNOSIS — I5021 Acute systolic (congestive) heart failure: Secondary | ICD-10-CM | POA: Insufficient documentation

## 2019-06-20 DIAGNOSIS — E119 Type 2 diabetes mellitus without complications: Secondary | ICD-10-CM | POA: Diagnosis not present

## 2019-06-20 DIAGNOSIS — Z87891 Personal history of nicotine dependence: Secondary | ICD-10-CM | POA: Diagnosis not present

## 2019-06-20 DIAGNOSIS — R001 Bradycardia, unspecified: Secondary | ICD-10-CM | POA: Insufficient documentation

## 2019-06-20 DIAGNOSIS — R570 Cardiogenic shock: Secondary | ICD-10-CM | POA: Diagnosis not present

## 2019-06-20 DIAGNOSIS — I2510 Atherosclerotic heart disease of native coronary artery without angina pectoris: Secondary | ICD-10-CM | POA: Diagnosis not present

## 2019-06-20 DIAGNOSIS — Z79899 Other long term (current) drug therapy: Secondary | ICD-10-CM | POA: Diagnosis not present

## 2019-06-20 DIAGNOSIS — I5022 Chronic systolic (congestive) heart failure: Secondary | ICD-10-CM

## 2019-06-20 DIAGNOSIS — Z7984 Long term (current) use of oral hypoglycemic drugs: Secondary | ICD-10-CM | POA: Insufficient documentation

## 2019-06-20 DIAGNOSIS — Z8674 Personal history of sudden cardiac arrest: Secondary | ICD-10-CM | POA: Diagnosis not present

## 2019-06-20 DIAGNOSIS — R9431 Abnormal electrocardiogram [ECG] [EKG]: Secondary | ICD-10-CM | POA: Diagnosis not present

## 2019-06-20 DIAGNOSIS — Z7982 Long term (current) use of aspirin: Secondary | ICD-10-CM | POA: Insufficient documentation

## 2019-06-20 DIAGNOSIS — J449 Chronic obstructive pulmonary disease, unspecified: Secondary | ICD-10-CM | POA: Diagnosis not present

## 2019-06-20 DIAGNOSIS — I252 Old myocardial infarction: Secondary | ICD-10-CM | POA: Insufficient documentation

## 2019-06-20 DIAGNOSIS — Z955 Presence of coronary angioplasty implant and graft: Secondary | ICD-10-CM | POA: Diagnosis not present

## 2019-06-20 DIAGNOSIS — Z95810 Presence of automatic (implantable) cardiac defibrillator: Secondary | ICD-10-CM | POA: Insufficient documentation

## 2019-06-20 LAB — BASIC METABOLIC PANEL
Anion gap: 9 (ref 5–15)
BUN: 14 mg/dL (ref 8–23)
CO2: 24 mmol/L (ref 22–32)
Calcium: 9.3 mg/dL (ref 8.9–10.3)
Chloride: 107 mmol/L (ref 98–111)
Creatinine, Ser: 1 mg/dL (ref 0.61–1.24)
GFR calc Af Amer: >60 mL/min (ref >60)
GFR calc non Af Amer: >60 mL/min (ref >60)
Glucose, Bld: 131 mg/dL — ABNORMAL HIGH (ref 70–99)
Potassium: 4.2 mmol/L (ref 3.5–5.1)
Sodium: 140 mmol/L (ref 135–145)

## 2019-06-20 LAB — CBC
HCT: 40 % (ref 39.0–52.0)
Hemoglobin: 12.8 g/dL — ABNORMAL LOW (ref 13.0–17.0)
MCH: 29.2 pg (ref 26.0–34.0)
MCHC: 32 g/dL (ref 30.0–36.0)
MCV: 91.3 fL (ref 80.0–100.0)
Platelets: 229 10*3/uL (ref 150–400)
RBC: 4.38 MIL/uL (ref 4.22–5.81)
RDW: 14.5 % (ref 11.5–15.5)
WBC: 6.4 10*3/uL (ref 4.0–10.5)
nRBC: 0 % (ref 0.0–0.2)

## 2019-06-20 LAB — BRAIN NATRIURETIC PEPTIDE: B Natriuretic Peptide: 209.9 pg/mL — ABNORMAL HIGH (ref 0.0–100.0)

## 2019-06-20 MED ORDER — SACUBITRIL-VALSARTAN 24-26 MG PO TABS: 1.0000 | ORAL_TABLET | Freq: Two times a day (BID) | ORAL | 5 refills | Status: DC

## 2019-06-20 NOTE — Patient Instructions (Addendum)
STOP Amiodarone  STOP Losartan  START Entresto 24/26mg  (1 tab) twice a day  Labs today We will only contact you if something comes back abnormal or we need to make some changes. Otherwise no news is good news!  You have been referred to cardiac rehab.  They will call you to schedule an appointment.   Your physician recommends that you schedule a follow-up appointment in: 2 weeks with pharmacy and 2-3 months with Dr Haroldine Laws and an ECHO  Your physician has requested that you have an echocardiogram. Echocardiography is a painless test that uses sound waves to create images of your heart. It provides your doctor with information about the size and shape of your heart and how well your heart's chambers and valves are working. This procedure takes approximately one hour. There are no restrictions for this procedure.  At the [facility], you and your health needs are our priority. As part of our continuing mission to provide you with exceptional heart care, we have created designated Provider Care Teams. These Care Teams include your primary Cardiologist (physician) and Advanced Practice Providers (APPs- Physician Assistants and Nurse Practitioners) who all work together to provide you with the care you need, when you need it.   You may see any of the following providers on your designated Care Team at your next follow up: Marland Kitchen Dr [name] . Dr Loralie Champagne . Darrick Grinder, NP . Lyda Jester, PA   Please be sure to bring in all your medications bottles to every appointment.

## 2019-06-20 NOTE — Addendum Note (Signed)
Encounter addended by: Valeda Malm, RN on: 06/20/2019 12:02 PM  Actions taken: Medication long-term status modified, Charge Capture section accepted, Order list changed, Diagnosis association updated, Clinical Note Signed

## 2019-06-20 NOTE — Progress Notes (Signed)
Advanced Heart Failure Clinic Note   Referring Physician: PCP: Patient, No Pcp Per PCP-Cardiologist: No primary care provider on file.   HPI:  Mr Eggert is a 63 y/o male with h/o COPD, DM2,  No known cardiac history. Brought to ER for on 03/31/19 with respiratory failure/cardiac arrest. Intubated in ER. Initial ECG with anterior ST elevation. Intubated and transported to cath lab emergently. Cath by Dr. Claiborne Billings with 3v CAD and totally occluded prox LAD. LCX anomalous off RCA. Underwent PCI of LAD which was diffusely diseased vessel. Developed cardiogenic shock requiring pressors and mechanical support with impella.  03/31/2019 Echo 20-25%. Hospital course complicated by Torsades. EP consulted placed ICD prior to discharge.    Here for post-hospital f/u. Says he is doing ok. Legs still a little weak but walking to mailbox and back. No CP, SOB or edema. Taking all meds without problem. Has stopped smoking. No ICD shocks.    Past Medical History:  Diagnosis Date  . Cardiogenic shock ([place])   . Medical history non-contributory     Current Outpatient Medications  Medication Sig Dispense Refill  . amiodarone (PACERONE) 200 MG tablet Take 1 tablet (200 mg total) by mouth daily. Patient is in the HF Assistance Program. 30 tablet 6  . aspirin 81 MG chewable tablet Chew 1 tablet (81 mg total) by mouth daily. 30 tablet 2  . atorvastatin (LIPITOR) 80 MG tablet Take 1 tablet (80 mg total) by mouth daily at 6 PM. 30 tablet 2  . Blood Glucose Monitoring Suppl (TRUE METRIX METER) w/Device KIT Use to measure blood sugar twice a day 1 kit 0  . carvedilol (COREG) 3.125 MG tablet Take 1 tablet (3.125 mg total) by mouth 2 (two) times daily with a meal. 60 tablet 2  . digoxin (LANOXIN) 0.125 MG tablet Take 1 tablet (0.125 mg total) by mouth daily. 30 tablet 2  . glucose blood (TRUE METRIX BLOOD GLUCOSE TEST) test strip Use as instructed 100 each 12  . Hyprom-Naphaz-Polysorb-Zn Sulf (CLEAR EYES COMPLETE) SOLN  Apply 1 drop to eye 3 (three) times daily. 15 mL 1  . losartan (COZAAR) 25 MG tablet Take 1 tablet (25 mg total) by mouth at bedtime. 30 tablet 2  . metFORMIN (GLUCOPHAGE) 500 MG tablet Take 1 tablet (500 mg total) by mouth 2 (two) times daily with a meal. 60 tablet 2  . nitroGLYCERIN (NITROSTAT) 0.4 MG SL tablet Place 1 tablet (0.4 mg total) under the tongue every 5 (five) minutes x 3 doses as needed for chest pain. 25 tablet 12  . spironolactone (ALDACTONE) 25 MG tablet Take 1 tablet (25 mg total) by mouth daily. 30 tablet 2  . ticagrelor (BRILINTA) 90 MG TABS tablet Take 1 tablet (90 mg total) by mouth 2 (two) times daily. 60 tablet 2  . TRUEplus Lancets 28G MISC Use to measure blood sugar twice a day 100 each 1   No current facility-administered medications for this encounter.     No Known Allergies    Social History   Socioeconomic History  . Marital status: Single    Spouse name: Not on file  . Number of children: Not on file  . Years of education: Not on file  . Highest education level: Not on file  Occupational History  . Not on file  Social Needs  . Financial resource strain: Not on file  . Food insecurity    Worry: Not on file    Inability: Not on file  . Transportation needs  Medical: Not on file    Non-medical: Not on file  Tobacco Use  . Smoking status: Former Smoker    Quit date: 04/08/2019    Years since quitting: 0.2  . Smokeless tobacco: Never Used  Substance and Sexual Activity  . Alcohol use: No  . Drug use: No  . Sexual activity: Not on file  Lifestyle  . Physical activity    Days per week: Not on file    Minutes per session: Not on file  . Stress: Not on file  Relationships  . Social Herbalist on phone: Not on file    Gets together: Not on file    Attends religious service: Not on file    Active member of club or organization: Not on file    Attends meetings of clubs or organizations: Not on file    Relationship status: Not on file   . Intimate partner violence    Fear of current or ex partner: Not on file    Emotionally abused: Not on file    Physically abused: Not on file    Forced sexual activity: Not on file  Other Topics Concern  . Not on file  Social History Narrative  . Not on file      Family History  Family history unknown: Yes    Vitals:   06/20/19 1131  BP: 140/90  Pulse: (!) 58  SpO2: 96%  Weight: 83.9 kg (185 lb)     PHYSICAL EXAM: General:  Well appearing. No resp difficulty HEENT: normal Neck: supple. no JVD. Carotids 2+ bilat; no bruits. No lymphadenopathy or thryomegaly appreciated. Cor: PMI nondisplaced. Regular rate & rhythm. No rubs, gallops or murmurs. Lungs: clear with decreased BS throughout Abdomen: soft, nontender, nondistended. No hepatosplenomegaly. No bruits or masses. Good bowel sounds. Extremities: no cyanosis, clubbing, rash, edema Neuro: alert & orientedx3, cranial nerves grossly intact. moves all 4 extremities w/o difficulty. Affect pleasant   ECG: SB 57 anterior qwaves. Personally reviewed    ASSESSMENT & PLAN:  1. Cardiac arrest/VT/torsades on 8/15 - Now s/p ICD - can stop amio   2. Acute systolic HF -> Cardiogenic shock due to acute anterior MI - s/p PCI/DES to LAD on 8/9 - Continue digoxin 0.125  - Continue spiro to 25 mg daily.  - He did not tolerate Entresto due to low BP in hospital - Switch losartan to Entresto 24/26 bid - Continue low dose carvedilol. - 03/31/2019 Echo 20-25% - Refer to PharmD for med titration - Labs today - SGLT2i eventually  3. CAD with acute anterior MI 8/9 - s/p PCI/DES to LAD. 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 On Asa+ brillinta. - No s/s ischemia - Refer to CR  4. DM2 - new diagnosis - Diabetes Coordinators consulted. Placed on metformin 500 mg twice a day. He will follow up at [place].  Hgb A1C 6.  - As above. Consider SGLT2i  eventually  5. COPD - congratulated on smoking cessation    [name], MD 06/20/19

## 2019-07-15 ENCOUNTER — Ambulatory Visit (HOSPITAL_COMMUNITY)
Admission: RE | Admit: 2019-07-15 | Discharge: 2019-07-15 | Disposition: A | Discharge status: Home / Self Care | Payer: Medicaid Other | Source: Ambulatory Visit | Attending: Cardiology | Admitting: Cardiology

## 2019-07-15 ENCOUNTER — Telehealth (HOSPITAL_COMMUNITY): Payer: Self-pay | Admitting: Pharmacist

## 2019-07-15 ENCOUNTER — Other Ambulatory Visit: Payer: Self-pay

## 2019-07-15 VITALS — BP 140/82 | HR 57 | Wt 190.0 lb

## 2019-07-15 DIAGNOSIS — J449 Chronic obstructive pulmonary disease, unspecified: Secondary | ICD-10-CM | POA: Diagnosis not present

## 2019-07-15 DIAGNOSIS — Z79899 Other long term (current) drug therapy: Secondary | ICD-10-CM | POA: Insufficient documentation

## 2019-07-15 DIAGNOSIS — I252 Old myocardial infarction: Secondary | ICD-10-CM | POA: Diagnosis not present

## 2019-07-15 DIAGNOSIS — Z87891 Personal history of nicotine dependence: Secondary | ICD-10-CM | POA: Insufficient documentation

## 2019-07-15 DIAGNOSIS — Z95810 Presence of automatic (implantable) cardiac defibrillator: Secondary | ICD-10-CM | POA: Diagnosis not present

## 2019-07-15 DIAGNOSIS — I255 Ischemic cardiomyopathy: Secondary | ICD-10-CM

## 2019-07-15 DIAGNOSIS — I2510 Atherosclerotic heart disease of native coronary artery without angina pectoris: Secondary | ICD-10-CM | POA: Insufficient documentation

## 2019-07-15 DIAGNOSIS — Z8674 Personal history of sudden cardiac arrest: Secondary | ICD-10-CM | POA: Insufficient documentation

## 2019-07-15 DIAGNOSIS — E119 Type 2 diabetes mellitus without complications: Secondary | ICD-10-CM | POA: Insufficient documentation

## 2019-07-15 DIAGNOSIS — Z955 Presence of coronary angioplasty implant and graft: Secondary | ICD-10-CM | POA: Insufficient documentation

## 2019-07-15 DIAGNOSIS — I5021 Acute systolic (congestive) heart failure: Secondary | ICD-10-CM | POA: Insufficient documentation

## 2019-07-15 LAB — BASIC METABOLIC PANEL
Anion gap: 5 (ref 5–15)
BUN: 13 mg/dL (ref 8–23)
CO2: 27 mmol/L (ref 22–32)
Calcium: 9.1 mg/dL (ref 8.9–10.3)
Chloride: 106 mmol/L (ref 98–111)
Creatinine, Ser: 1.08 mg/dL (ref 0.61–1.24)
GFR calc Af Amer: >60 mL/min (ref >60)
GFR calc non Af Amer: >60 mL/min (ref >60)
Glucose, Bld: 97 mg/dL (ref 70–99)
Potassium: 4.2 mmol/L (ref 3.5–5.1)
Sodium: 138 mmol/L (ref 135–145)

## 2019-07-15 LAB — MAGNESIUM: Magnesium: 2 mg/dL (ref 1.7–2.4)

## 2019-07-15 LAB — BRAIN NATRIURETIC PEPTIDE: B Natriuretic Peptide: 217.2 pg/mL — ABNORMAL HIGH (ref 0.0–100.0)

## 2019-07-15 MED ORDER — ENTRESTO 49-51 MG PO TABS: 1.0000 | ORAL_TABLET | Freq: Two times a day (BID) | ORAL | 6 refills | Status: DC

## 2019-07-15 MED FILL — CARVEDILOL 3.125 MG TABLET: 3.125 | 30 days supply | Qty: 60 | Fill #2

## 2019-07-15 MED FILL — ASPIRIN CHILD 81 MG TAB CHE: 81 | 30 days supply | Qty: 30 | Fill #2

## 2019-07-15 MED FILL — ATORVASTATIN 80 MG TABLET: 80 | 30 days supply | Qty: 30 | Fill #2

## 2019-07-15 MED FILL — DIGOXIN 0.125 MG TABLET: 125 | 30 days supply | Qty: 30 | Fill #2

## 2019-07-15 MED FILL — spIRONOLACTONE 25 MG TABS: 25 | 30 days supply | Qty: 30 | Fill #2

## 2019-07-15 NOTE — Progress Notes (Signed)
PCP: Patient, No Pcp Per Cardiologist: Dr. Haroldine Laws  HPI:  Stephen Jennings is a56 y/o male with h/o COPD, DM2,  No known cardiac history. Brought to ER on 03/31/19 with respiratory failure/cardiac arrest. Intubated in ER. Initial ECG with anterior ST elevation. Intubated and transported to cath lab emergently.Cath by Dr. Claiborne Billings with 3v CAD and totally occluded prox LAD. LCX anomalous off RCA. Underwent PCI of LAD which was diffusely diseased vessel. Developed cardiogenic shockrequiring pressors and mechanical support with impella. 8/9/2020Echo 20-25%. Hospital course complicated by Torsades. EP consulted placed ICD prior to discharge.   Recently presented to HF Clinic on 06/20/19 for follow-up with Dr. Haroldine Laws. Stated he was doing ok. He reported his legs were still a little weak but able to walk to mailbox and back. No CP, SOB or edema. Taking all meds without problem. Has stopped smoking. No ICD shocks.  Today he returns to HF clinic for pharmacist medication titration. At last visit with MD, losartan was changed to Entresto 24/26 mg BID. He has been well since last visit. Stated he felt poorly after starting the Entresto at first, but has not had any issues since moving the administration time to 8am/8pm. No dizziness, lightheadedness, chest pain or palpitations. His breathing is better since starting the medications. He can walk ~1/5 of a mile without stopping and can now make it to his mailbox before getting SOB. He will start cardiac rehab on 07/25/19. He does not weigh himself daily but he does not take any diuretic. No LEE, PND or orthopnea. His appetite is good. He follows a low sodium diet.       . Shortness of breath/dyspnea on exertion? yes - can walk to mailbox . Orthopnea/PND? no . Edema? no . Lightheadedness/dizziness? no . Daily weights at home? no . Blood pressure/heart rate monitoring at home? Yes - usually 130-140/80s . Following low-sodium/fluid-restricted diet? yes  HF  Medications: Carvedilol 3.125 mg BID Entresto 24/26 mg BID Spironolactone 25 mg daily Digoxin 0.125 mg daily  Has the patient been experiencing any side effects to the medications prescribed?  no  Does the patient have any problems obtaining medications due to transportation or finances?   No - [place] Medicaid  Understanding of regimen: good Understanding of indications: good Potential of compliance: good Patient understands to avoid NSAIDs. Patient understands to avoid decongestants.    Pertinent Lab Values: . Serum creatinine 1.08, BUN 13, Potassium 4.2, Sodium 138, Magnesium 2.0, Digoxin <0.2 ng/mL; BNP 217.2 pg/mL  Vital Signs: . Weight: 190 lbs (last clinic weight: 185) . Blood pressure: 140/82  . Heart rate: 57   Assessment: 1. Cardiac arrest/VT/torsades on 8/15 -Now s/p ICD - Amiodarone discontinued at last clinic visit on 06/20/19.  2. Acute systolic HF -> Cardiogenic shock due to acute anterior MI - s/p PCI/DES to LAD on 8/9 -8/9/2020Echo 20-25% - NYHA class II-III symptoms. Euvolemic on exam. - Labs: Scr stable at 1.08, K 4.2 - Vitals: BP 140/82, HR 57 - Not currently taking diuretic.   - Continue carvedilol 3.125 mg BID. HR currently limiting up-titration.  - Increase Entresto to 49/51 mg bid - Continue spironolactone 25 mg daily.  - Continue digoxin 0.125 mg daily. Digoxin level <0.2 ng/mL on 04/06/19. - Would be good candidate for SGLT2i given concomitant HF and T2DM. He will need PA for [facility]) once initiated.    3. CAD with acute anterior MI 8/9 - s/p PCI/DES to LAD. Diffusely diseased vessel - + residual CAD in RCA and  anomalous LCX - Continue DAPT/statin. On Asa+ brillinta. - No s/s ischemia - Refer to CR  4. DM2 - new diagnosis - Diabetes Coordinators consulted. Placed on metformin 500 mg twice a day. He will follow up at [facility]. Hgb A1C 6. - As above. Consider SGLT2i eventually  5. COPD -  congratulated on smoking cessation   Plan: 1) Medication changes: Based on clinical presentation, vital signs and recent labs will increase Entresto to 49/51 mg BID.  2) Labs stable: Scr 1.08, K 4.2 3) Follow-up: 4 weeks with Pharmacy Clinic.   Stephen Jennings, PharmD, BCPS, BCCP, CPP Heart Failure Clinic Pharmacist [phone]  Discussed patient with Dr. Lynelle Smoke.  Agree with assessment and plan.  Stephen Jennings Pharm.D. CPP, BCPS Clinical Pharmacist [phone] 07/15/2019 10:25 PM

## 2019-07-15 NOTE — Telephone Encounter (Signed)
Advanced Heart Failure Patient Advocate Encounter  Prior Authorization for Delene Loll has been approved.    PA# [id number] Recipient ID: [id number] L Effective dates: 07/15/19 through 07/14/20  Patients co-pay is $3.00  Audry Riles, PharmD, BCPS, BCCP, CPP Heart Failure Clinic Pharmacist [phone]

## 2019-07-15 NOTE — Patient Instructions (Addendum)
It was a pleasure seeing you today!  MEDICATIONS: -We are changing your medications today -Increase Entresto to 49/51 mg (1 tab) twice daily. You may take 2 tablets of the Entresto 24/26 mg strength twice daily until you pick up the new strength. I will complete the prior authorization for your insurance. -Call if you have questions about your medications.  LABS: -We will call you if your labs need attention.  NEXT APPOINTMENT: Return to clinic in 4 weeks with Pharmacy Clinic.  In general, to take care of your heart failure: -Limit your fluid intake to 2 Liters (half-gallon) per day.   -Limit your salt intake to ideally 2-3 grams (2000-3000 mg) per day. -Weigh yourself daily and record, and bring that "weight diary" to your next appointment.  (Weight gain of 2-3 pounds in 1 day typically means fluid weight.) -The medications for your heart are to help your heart and help you live longer.   -Please contact us before stopping any of your heart medications.  Call the clinic at [phone] with questions or to reschedule future appointments.

## 2019-07-16 ENCOUNTER — Encounter: Payer: Self-pay | Admitting: Cardiology

## 2019-07-16 ENCOUNTER — Ambulatory Visit (INDEPENDENT_AMBULATORY_CARE_PROVIDER_SITE_OTHER): Payer: Medicaid Other | Admitting: Cardiology

## 2019-07-16 VITALS — BP 148/84 | HR 55 | Ht 71.0 in | Wt 192.6 lb

## 2019-07-16 DIAGNOSIS — I255 Ischemic cardiomyopathy: Secondary | ICD-10-CM

## 2019-07-16 LAB — CUP PACEART REMOTE DEVICE CHECK
Battery Remaining Longevity: 81 mo
Battery Remaining Percentage: 95 %
Battery Voltage: 3.19 V
Brady Statistic AP VP Percent: 1 %
Brady Statistic AP VS Percent: 5.8 %
Brady Statistic AS VP Percent: 1 %
Brady Statistic AS VS Percent: 94 %
Brady Statistic RA Percent Paced: 5 %
Brady Statistic RV Percent Paced: 1 %
Date Time Interrogation Session: 20201124020023
HighPow Impedance: 68 Ohm
HighPow Impedance: 68 Ohm
Implantable Lead Implant Date: 20200824
Implantable Lead Implant Date: 20200824
Implantable Lead Location: 753859
Implantable Lead Location: 753860
Implantable Pulse Generator Implant Date: 20200824
Lead Channel Impedance Value: 390 Ohm
Lead Channel Impedance Value: 390 Ohm
Lead Channel Pacing Threshold Amplitude: 1 V
Lead Channel Pacing Threshold Amplitude: 1 V
Lead Channel Pacing Threshold Pulse Width: 0.5 ms
Lead Channel Pacing Threshold Pulse Width: 0.5 ms
Lead Channel Sensing Intrinsic Amplitude: 1 mV
Lead Channel Sensing Intrinsic Amplitude: 11.9 mV
Lead Channel Setting Pacing Amplitude: 3.5 V
Lead Channel Setting Pacing Amplitude: 3.5 V
Lead Channel Setting Pacing Pulse Width: 0.5 ms
Lead Channel Setting Sensing Sensitivity: 0.5 mV
Pulse Gen Serial Number: 1300954

## 2019-07-16 NOTE — Progress Notes (Signed)
 Electrophysiology Office Note   Date:  07/16/2019   ID:  Stephen Jennings, DOB 10/20/1955, MRN [id number]  PCP:  Fulp, Cammie, MD  Cardiologist:  Bensimhon Primary Electrophysiologist:   Martin , MD    Chief Complaint: torsades   History of Present Illness: Stephen Jennings is a 63 y.o. male who is being seen today for the evaluation of ICD at the request of Daniel Bemsimhon. Presenting today for electrophysiology evaluation.  He has a history of COPD, tobacco abuse who presented to the hospital with STEMI and had a episode of torsades greater than 48 hours after his MI.  He had a [employer] Jude ICD implanted 04/15/2019.  Today, he denies symptoms of palpitations, chest pain, shortness of breath, orthopnea, PND, lower extremity edema, claudication, dizziness, presyncope, syncope, bleeding, or neurologic sequela. The patient is tolerating medications without difficulties.    Past Medical History:  Diagnosis Date  . Cardiogenic shock ([facility])   . Medical history non-contributory    Past Surgical History:  Procedure Laterality Date  . CORONARY/GRAFT ACUTE MI REVASCULARIZATION N/A 03/31/2019   Procedure: Coronary/Graft Acute MI Revascularization;  Surgeon: Kelly, Thomas A, MD;  Location: [facility];  Service: Cardiovascular;  Laterality: N/A;  . ICD IMPLANT N/A 04/15/2019   Procedure: ICD IMPLANT;  Surgeon: ,  Martin, MD;  Location: [facility];  Service: Cardiovascular;  Laterality: N/A;  . LEFT HEART CATH AND CORONARY ANGIOGRAPHY N/A 03/31/2019   Procedure: LEFT HEART CATH AND CORONARY ANGIOGRAPHY;  Surgeon: Kelly, Thomas A, MD;  Location: [facility] CV [facility];  Service: Cardiovascular;  Laterality: N/A;  . NO PAST SURGERIES    . RIGHT HEART CATH N/A 03/31/2019   Procedure: RIGHT HEART CATH;  Surgeon: Kelly, Thomas A, MD;  Location: [facility];  Service: Cardiovascular;  Laterality: N/A;  . VENTRICULAR ASSIST DEVICE INSERTION N/A 03/31/2019   Procedure:  VENTRICULAR ASSIST DEVICE INSERTION;  Surgeon: Kelly, Thomas A, MD;  Location: [facility];  Service: Cardiovascular;  Laterality: N/A;     Current Outpatient Medications  Medication Sig Dispense Refill  . aspirin 81 MG chewable tablet Chew 1 tablet (81 mg total) by mouth daily. 30 tablet 2  . atorvastatin (LIPITOR) 80 MG tablet Take 1 tablet (80 mg total) by mouth daily at 6 PM. 30 tablet 2  . Blood Glucose Monitoring Suppl (TRUE METRIX METER) w/Device KIT Use to measure blood sugar twice a day 1 kit 0  . carvedilol (COREG) 3.125 MG tablet Take 1 tablet (3.125 mg total) by mouth 2 (two) times daily with a meal. 60 tablet 2  . digoxin (LANOXIN) 0.125 MG tablet Take 1 tablet (0.125 mg total) by mouth daily. 30 tablet 2  . glucose blood (TRUE METRIX BLOOD GLUCOSE TEST) test strip Use as instructed 100 each 12  . Hyprom-Naphaz-Polysorb-Zn Sulf (CLEAR EYES COMPLETE) SOLN Apply 1 drop to eye 3 (three) times daily. 15 mL 1  . metFORMIN (GLUCOPHAGE) 500 MG tablet Take 1 tablet (500 mg total) by mouth 2 (two) times daily with a meal. 60 tablet 2  . nitroGLYCERIN (NITROSTAT) 0.4 MG SL tablet Place 1 tablet (0.4 mg total) under the tongue every 5 (five) minutes x 3 doses as needed for chest pain. 25 tablet 12  . sacubitril-valsartan (ENTRESTO) 49-51 MG Take 1 tablet by mouth 2 (two) times daily. 60 tablet 6  . spironolactone (ALDACTONE) 25 MG tablet Take 1 tablet (25 mg total) by mouth daily. 30 tablet 2  . ticagrelor (  BRILINTA) 90 MG TABS tablet Take 1 tablet (90 mg total) by mouth 2 (two) times daily. 60 tablet 2  . TRUEplus Lancets 28G MISC Use to measure blood sugar twice a day 100 each 1   No current facility-administered medications for this visit.     Allergies:   Patient has no known allergies.   Social History:  The patient  reports that he quit smoking about 3 months ago. He has never used smokeless tobacco. He reports that he does not drink alcohol or use drugs.   Family History:   The patient's family history includes Heart failure in his mother; Hypertension in his mother.    ROS:  Please see the history of present illness.   Otherwise, review of systems is positive for none.   All other systems are reviewed and negative.    PHYSICAL EXAM: VS:  BP (!) 148/84   Pulse (!) 55   Ht 5' 11" (1.803 m)   Wt 192 lb 9.6 oz (87.4 kg)   SpO2 97%   BMI 26.86 kg/m  , BMI Body mass index is 26.86 kg/m. GEN: Well nourished, well developed, in no acute distress  HEENT: normal  Neck: no JVD, carotid bruits, or masses Cardiac: RRR; no murmurs, rubs, or gallops,no edema  Respiratory:  clear to auscultation bilaterally, normal work of breathing GI: soft, nontender, nondistended, + BS MS: no deformity or atrophy  Skin: warm and dry, device pocket is well healed Neuro:  Strength and sensation are intact Psych: euthymic mood, full affect  EKG:  EKG is ordered today. Personal review of the ekg ordered shows sinus rhythm, anterior infarct, rate 55  Device interrogation is reviewed today in detail.  See PaceArt for details.   Recent Labs: 03/31/2019: TSH 0.887 05/08/2019: ALT 39 06/20/2019: Hemoglobin 12.8; Platelets 229 07/15/2019: B Natriuretic Peptide 217.2; BUN 13; Creatinine, Ser 1.08; Magnesium 2.0; Potassium 4.2; Sodium 138    Lipid Panel     Component Value Date/Time   CHOL 253 (H) 03/31/2019 0013   TRIG 99 04/12/2019 0530   HDL 46 03/31/2019 0013   CHOLHDL 5.5 03/31/2019 0013   VLDL 19 03/31/2019 0013   LDLCALC 188 (H) 03/31/2019 0013     Wt Readings from Last 3 Encounters:  07/16/19 192 lb 9.6 oz (87.4 kg)  07/15/19 190 lb (86.2 kg)  06/20/19 185 lb (83.9 kg)      Other studies Reviewed: Additional studies/ records that were reviewed today include: TTE 03/31/19  Review of the above records today demonstrates:   1. The left ventricle has severely reduced systolic function, with an ejection fraction of 20-25%. The cavity size was moderately dilated. Left  ventricular diastolic function could not be evaluated.  2. Septal apical and anterior wall hyppkinesis Impella device seen in LV extending to mid ventrical CW through AV suggests low output.  3. The right ventricle has moderately reduced systolic function. The cavity was mildly enlarged. There is no increase in right ventricular wall thickness.  4. Left atrial size was moderately dilated.  5. Right atrial size was mildly dilated.  6. The aortic valve was not well visualized.  7. Impella device seen coursing across AV.  8. The aorta is normal in size and structure.  9. The interatrial septum was not well visualized.   ASSESSMENT AND PLAN:  1.  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  He is now status post [place] ICD.  Device functioning appropriately.  No changes.  2.  Ischemic cardiomyopathy:  Currently on optimal medical therapy per heart failure cardiology.  They are handling increasing his Entresto dose for his hypertension.  Was increased yesterday.  3.  Coronary artery disease: No current chest pain.    Current medicines are reviewed at length with the patient today.   The patient does not have concerns regarding his medicines.  The following changes were made today:  none  Labs/ tests ordered today include:  Orders Placed This Encounter  Procedures  . EKG 12-Lead     Disposition:   FU with   9 months  Signed,  Martin , MD  07/16/2019 2:37 PM     [facility] [address] [place] [address] [phone] (office) [phone] (fax)  

## 2019-07-22 ENCOUNTER — Telehealth (HOSPITAL_COMMUNITY): Payer: Self-pay

## 2019-07-22 ENCOUNTER — Other Ambulatory Visit (HOSPITAL_COMMUNITY): Payer: Self-pay | Admitting: *Deleted

## 2019-07-22 DIAGNOSIS — I5022 Chronic systolic (congestive) heart failure: Secondary | ICD-10-CM

## 2019-07-22 MED ORDER — LOSARTAN POTASSIUM 25 MG PO TABS: 25.0000 mg | ORAL_TABLET | Freq: Every day | ORAL | 3 refills | Status: DC

## 2019-07-22 MED ORDER — LOSARTAN POTASSIUM 25 MG PO TABS: 25.0000 mg | ORAL_TABLET | Freq: Every day | ORAL | 5 refills | Status: DC

## 2019-07-22 MED FILL — LOSARTAN POTASSIUM 25 MG TA: 25 | 30 days supply | Qty: 30 | Fill #0

## 2019-07-22 NOTE — Telephone Encounter (Signed)
Pt advised of recommendations. Pt will discontinue Entresto and start Losartan 25mg  at bedtime.  Will return to clinic on Friday 12/4 for blood work.

## 2019-07-22 NOTE — Telephone Encounter (Signed)
He refuses entresto due to side effects.  Please start losartan 25 mg at bed time.   Check BMET this week.   Kamaree Wheatley NP-C   12:42 PM

## 2019-07-22 NOTE — Addendum Note (Signed)
Addended by: Valeda Malm on: 07/22/2019 01:39 PM   Modules accepted: Orders

## 2019-07-22 NOTE — Telephone Encounter (Signed)
Returned call to patient as he left a message stating that a new medication he was started was causing side effects.  When called, he reports he was started on Entresto at last visit with MD and increased dose last week with pharmacist.  Since he increased his dose last week, he has been experiencing jitters and increasing nervousness. He states its hard for him to be still or sleep.  He said he stopped medication about 3-4 days ago.  His symptoms have subsided some however he still have some jitters.  He is reluctant to take it again.  He states that wants to go back on Losartan as he did not have side effects.  Please advise.

## 2019-07-22 NOTE — Addendum Note (Signed)
Addended by: Valeda Malm on: 07/22/2019 12:56 PM   Modules accepted: Orders

## 2019-07-23 ENCOUNTER — Encounter: Payer: Self-pay | Admitting: Cardiology

## 2019-07-23 ENCOUNTER — Telehealth (HOSPITAL_COMMUNITY): Payer: Self-pay

## 2019-07-23 NOTE — Telephone Encounter (Signed)
Cardiac Rehab Medication Review by a Pharmacist  Does the patient  feel that his/her medications are working for him?  yes  Has the patient been experiencing any side effects to the medications prescribed?  No, believes he is not feeling overall well due to recent diet not medication related.   Does the patient measure his/her own blood pressure or blood glucose at home?  Yes, measure blood pressure once a day.  Does the patient have any problems obtaining medications due to transportation or finances?   no  Understanding of regimen: excellent Understanding of indications: excellent Potential of compliance: excellent   Pharmacist comments: Blood pressure has been running slightly high, last night blood pressure was 140/80's but he is concern he ate too much salt over Thanksgiving holiday. Expressed he is having headaches and not feeling overall well. Patient expressed he has ben eating a good amount of salt and drinking a good amount of caffeine.   Acey Lav, PharmD  PGY1 Acute Care Pharmacy Resident [phone] 07/23/2019 11:40 AM

## 2019-07-24 ENCOUNTER — Telehealth (HOSPITAL_COMMUNITY): Payer: Self-pay

## 2019-07-24 NOTE — Telephone Encounter (Signed)
Successful telephone encounter to Stephen Jennings to confirm Cardiac Rehab orientation appointment for 07/25/2019 at 0900. Nursing assessment completed. Instructions for appointment provided. Patient screening for Covid-19 negative. Vaughn Beaumier E. Laray Anger, BSN

## 2019-07-25 ENCOUNTER — Other Ambulatory Visit: Payer: Self-pay

## 2019-07-25 ENCOUNTER — Encounter (HOSPITAL_COMMUNITY)
Admission: RE | Admit: 2019-07-25 | Discharge: 2019-07-25 | Disposition: A | Discharge status: Home / Self Care | Payer: Medicaid Other | Source: Ambulatory Visit | Attending: Cardiovascular Disease | Admitting: Cardiovascular Disease

## 2019-07-25 ENCOUNTER — Encounter (HOSPITAL_COMMUNITY): Payer: Self-pay

## 2019-07-25 ENCOUNTER — Ambulatory Visit (HOSPITAL_COMMUNITY)
Admission: RE | Admit: 2019-07-25 | Discharge: 2019-07-25 | Disposition: A | Discharge status: Home / Self Care | Payer: Medicaid Other | Source: Ambulatory Visit | Attending: Cardiology | Admitting: Cardiology

## 2019-07-25 VITALS — BP 122/64 | HR 60 | Temp 97.7°F | Ht 70.75 in | Wt 182.3 lb

## 2019-07-25 DIAGNOSIS — Z955 Presence of coronary angioplasty implant and graft: Secondary | ICD-10-CM

## 2019-07-25 DIAGNOSIS — I2102 ST elevation (STEMI) myocardial infarction involving left anterior descending coronary artery: Secondary | ICD-10-CM

## 2019-07-25 DIAGNOSIS — I5022 Chronic systolic (congestive) heart failure: Secondary | ICD-10-CM | POA: Diagnosis not present

## 2019-07-25 HISTORY — DX: Type 2 diabetes mellitus without complications: E11.9

## 2019-07-25 HISTORY — DX: Hyperlipidemia, unspecified: E78.5

## 2019-07-25 HISTORY — DX: Heart failure, unspecified: I50.9

## 2019-07-25 HISTORY — DX: Atherosclerotic heart disease of native coronary artery without angina pectoris: I25.10

## 2019-07-25 LAB — BASIC METABOLIC PANEL
Anion gap: 8 (ref 5–15)
BUN: 18 mg/dL (ref 8–23)
CO2: 27 mmol/L (ref 22–32)
Calcium: 9.3 mg/dL (ref 8.9–10.3)
Chloride: 104 mmol/L (ref 98–111)
Creatinine, Ser: 1.09 mg/dL (ref 0.61–1.24)
GFR calc Af Amer: >60 mL/min (ref >60)
GFR calc non Af Amer: >60 mL/min (ref >60)
Glucose, Bld: 168 mg/dL — ABNORMAL HIGH (ref 70–99)
Potassium: 4.1 mmol/L (ref 3.5–5.1)
Sodium: 139 mmol/L (ref 135–145)

## 2019-07-25 LAB — GLUCOSE, CAPILLARY: Glucose-Capillary: 111 mg/dL — ABNORMAL HIGH (ref 70–99)

## 2019-07-25 NOTE — Progress Notes (Signed)
Cardiac Individual Treatment Plan  Patient Details  Name: Stephen Jennings MRN: [id number] Date of Birth: Sep 30, 1955 Referring Provider:     [place] from 07/25/2019 in [place]  Referring Provider  Dr. Shelva Majestic      Initial Encounter Date:    [place] from 07/25/2019 in [place]  Date  07/25/19      Visit Diagnosis: ST elevation myocardial infarction involving left anterior descending (LAD) coronary artery ([place]) 03/31/19  S/P coronary artery stent placement S/P DES LAD 03/31/19  Patient's Home Medications on Admission:  Current Outpatient Medications:  .  aspirin 81 MG chewable tablet, Chew 1 tablet (81 mg total) by mouth daily., Disp: 30 tablet, Rfl: 2 .  atorvastatin (LIPITOR) 80 MG tablet, Take 1 tablet (80 mg total) by mouth daily at 6 PM., Disp: 30 tablet, Rfl: 2 .  Blood Glucose Monitoring Suppl (TRUE METRIX METER) w/Device KIT, Use to measure blood sugar twice a day, Disp: 1 kit, Rfl: 0 .  carvedilol (COREG) 3.125 MG tablet, Take 1 tablet (3.125 mg total) by mouth 2 (two) times daily with a meal., Disp: 60 tablet, Rfl: 2 .  digoxin (LANOXIN) 0.125 MG tablet, Take 1 tablet (0.125 mg total) by mouth daily., Disp: 30 tablet, Rfl: 2 .  glucose blood (TRUE METRIX BLOOD GLUCOSE TEST) test strip, Use as instructed, Disp: 100 each, Rfl: 12 .  Hyprom-Naphaz-Polysorb-Zn Sulf (CLEAR EYES COMPLETE) SOLN, Apply 1 drop to eye 3 (three) times daily., Disp: 15 mL, Rfl: 1 .  losartan (COZAAR) 25 MG tablet, Take 1 tablet (25 mg total) by mouth at bedtime., Disp: 30 tablet, Rfl: 5 .  metFORMIN (GLUCOPHAGE) 500 MG tablet, Take 1 tablet (500 mg total) by mouth 2 (two) times daily with a meal., Disp: 60 tablet, Rfl: 2 .  nitroGLYCERIN (NITROSTAT) 0.4 MG SL tablet, Place 1 tablet (0.4 mg total) under the tongue every 5 (five) minutes x 3 doses as needed for chest pain., Disp: 25 tablet,  Rfl: 12 .  spironolactone (ALDACTONE) 25 MG tablet, Take 1 tablet (25 mg total) by mouth daily., Disp: 30 tablet, Rfl: 2 .  ticagrelor (BRILINTA) 90 MG TABS tablet, Take 1 tablet (90 mg total) by mouth 2 (two) times daily., Disp: 60 tablet, Rfl: 2 .  TRUEplus Lancets 28G MISC, Use to measure blood sugar twice a day, Disp: 100 each, Rfl: 1  Past Medical History: Past Medical History:  Diagnosis Date  . Cardiogenic shock ([place])   . CHF (congestive heart failure) ([place])   . Coronary artery disease   . Diabetes mellitus without complication ([place])   . Hyperlipidemia   . Medical history non-contributory     Tobacco Use: Social History   Tobacco Use  Smoking Status Former Smoker  . Quit date: 04/08/2019  . Years since quitting: 0.2  Smokeless Tobacco Never Used    Labs: Recent Review Scientist, physiological    Labs for ITP Cardiac and Pulmonary Rehab Latest Ref Rng & Units 04/09/2019 04/09/2019 04/10/2019 04/11/2019 04/12/2019   Cholestrol 0 - 200 mg/dL - - - - -   LDLCALC 0 - 99 mg/dL - - - - -   HDL >40 mg/dL - - - - -   Trlycerides <150 mg/dL - - - - 99   Hemoglobin A1c 4.8 - 5.6 % - - - - -   PHART 7.350 - 7.450 - - - - -   PCO2ART  32.0 - 48.0 mmHg - - - - -   HCO3 20.0 - 28.0 mmol/L - - - - -   TCO2 22 - 32 mmol/L - - - - -   ACIDBASEDEF 0.0 - 2.0 mmol/L - - - - -   O2SAT % 83.8 59.8 61.4 58.4 55.3      Capillary Blood Glucose: Lab Results  Component Value Date   GLUCAP 111 (H) 07/25/2019   GLUCAP 98 04/16/2019   GLUCAP 107 (H) 04/15/2019   GLUCAP 128 (H) 04/15/2019   GLUCAP 107 (H) 04/15/2019     Exercise Target Goals: Exercise Program Goal: Individual exercise prescription set using results from initial 6 min walk test and THRR while considering  patient's activity barriers and safety.   Exercise Prescription Goal: Starting with aerobic activity 30 plus minutes a day, 3 days per week for initial exercise prescription. Provide home exercise prescription and guidelines that  participant acknowledges understanding prior to discharge.  Activity Barriers & Risk Stratification: Activity Barriers & Cardiac Risk Stratification - 07/25/19 1143      Activity Barriers & Cardiac Risk Stratification   Activity Barriers  Deconditioning;Muscular Weakness    Cardiac Risk Stratification  High       6 Minute Walk: 6 Minute Walk    Row Name 07/25/19 1142         6 Minute Walk   Phase  Initial     Distance  1376 feet     Walk Time  6 minutes     # of Rest Breaks  0     MPH  2.6     METS  3.3     RPE  9     Perceived Dyspnea   0     VO2 Peak  11.79     Symptoms  No     Resting HR  60 bpm     Resting BP  122/64     Resting Oxygen Saturation   98 %     Exercise Oxygen Saturation  during 6 min walk  100 %     Max Ex. HR  76 bpm     Max Ex. BP  132/72     2 Minute Post BP  124/68        Oxygen Initial Assessment:   Oxygen Re-Evaluation:   Oxygen Discharge (Final Oxygen Re-Evaluation):   Initial Exercise Prescription: Initial Exercise Prescription - 07/25/19 1100      Date of Initial Exercise RX and Referring Provider   Date  07/25/19    Referring Provider  Dr. Shelva Majestic    Expected Discharge Date  09/20/19      Recumbant Bike   Level  2    Watts  35    Minutes  15    METs  3.38      NuStep   Level  3    SPM  85    Minutes  15    METs  3      Prescription Details   Frequency (times per week)  3    Duration  Progress to 30 minutes of continuous aerobic without signs/symptoms of physical distress      Intensity   THRR 40-80% of Max Heartrate  63-126    Ratings of Perceived Exertion  11-13      Progression   Progression  Continue to progress workloads to maintain intensity without signs/symptoms of physical distress.      Horticulturist, commercial Prescription  Yes    Weight  3 lbs.     Reps  10-15       Perform Capillary Blood Glucose checks as needed.  Exercise Prescription Changes:   Exercise  Comments:   Exercise Goals and Review: Exercise Goals    Row Name 07/25/19 1153             Exercise Goals   Increase Physical Activity  Yes       Intervention  Provide advice, education, support and counseling about physical activity/exercise needs.;Develop an individualized exercise prescription for aerobic and resistive training based on initial evaluation findings, risk stratification, comorbidities and participant's personal goals.       Expected Outcomes  Short Term: Attend rehab on a regular basis to increase amount of physical activity.;Long Term: Add in home exercise to make exercise part of routine and to increase amount of physical activity.;Long Term: Exercising regularly at least 3-5 days a week.       Increase Strength and Stamina  Yes       Intervention  Provide advice, education, support and counseling about physical activity/exercise needs.;Develop an individualized exercise prescription for aerobic and resistive training based on initial evaluation findings, risk stratification, comorbidities and participant's personal goals.       Expected Outcomes  Short Term: Increase workloads from initial exercise prescription for resistance, speed, and METs.;Short Term: Perform resistance training exercises routinely during rehab and add in resistance training at home;Long Term: Improve cardiorespiratory fitness, muscular endurance and strength as measured by increased METs and functional capacity (6MWT)       Able to understand and use rate of perceived exertion (RPE) scale  Yes       Intervention  Provide education and explanation on how to use RPE scale       Expected Outcomes  Short Term: Able to use RPE daily in rehab to express subjective intensity level;Long Term:  Able to use RPE to guide intensity level when exercising independently       Knowledge and understanding of Target Heart Rate Range (THRR)  Yes       Intervention  Provide education and explanation of THRR including how  the numbers were predicted and where they are located for reference       Expected Outcomes  Short Term: Able to state/look up THRR;Long Term: Able to use THRR to govern intensity when exercising independently;Short Term: Able to use daily as guideline for intensity in rehab       Able to check pulse independently  Yes       Intervention  Provide education and demonstration on how to check pulse in carotid and radial arteries.;Review the importance of being able to check your own pulse for safety during independent exercise       Expected Outcomes  Short Term: Able to explain why pulse checking is important during independent exercise;Long Term: Able to check pulse independently and accurately       Understanding of Exercise Prescription  Yes       Intervention  Provide education, explanation, and written materials on patient's individual exercise prescription       Expected Outcomes  Short Term: Able to explain program exercise prescription;Long Term: Able to explain home exercise prescription to exercise independently          Exercise Goals Re-Evaluation :    Discharge Exercise Prescription (Final Exercise Prescription Changes):   Nutrition:  Target Goals: Understanding of nutrition guidelines, daily intake of sodium <1525m, cholesterol <  236m, calories 30% from fat and 7% or less from saturated fats, daily to have 5 or more servings of fruits and vegetables.  Biometrics: Pre Biometrics - 07/25/19 1153      Pre Biometrics   Height  5' 10.75" (1.797 m)    Weight  82.7 kg    Waist Circumference  38 inches    Hip Circumference  39.5 inches    Waist to Hip Ratio  0.96 %    BMI (Calculated)  25.61    Triceps Skinfold  18 mm    % Body Fat  26.2 %    Grip Strength  37 kg    Flexibility  13.25 in    Single Leg Stand  14.12 seconds        Nutrition Therapy Plan and Nutrition Goals:   Nutrition Assessments:   Nutrition Goals Re-Evaluation:   Nutrition Goals Discharge (Final  Nutrition Goals Re-Evaluation):   Psychosocial: Target Goals: Acknowledge presence or absence of significant depression and/or stress, maximize coping skills, provide positive support system. Participant is able to verbalize types and ability to use techniques and skills needed for reducing stress and depression.  Initial Review & Psychosocial Screening: Initial Psych Review & Screening - 07/25/19 1003      Initial Review   Current issues with  Current Abuse or Neglect to Report;Current Stress Concerns    Source of Stress Concerns  Chronic Illness;Occupation;Financial    Comments  [place] stress related to his CV event and lack of occupation.      Family Dynamics   Good Support System?  Yes   Pt states his wife is a source of support.     Barriers   Psychosocial barriers to participate in program  The patient should benefit from training in stress management and relaxation.      Screening Interventions   Interventions  Encouraged to exercise       Quality of Life Scores: Quality of Life - 07/25/19 0958      Quality of Life   Select  Quality of Life      Quality of Life Scores   Health/Function Pre  24.63 %    Socioeconomic Pre  17.81 %    Psych/Spiritual Pre  29.14 %    Family Pre  27.6 %    GLOBAL Pre  24.4 %      Scores of 19 and below usually indicate a poorer quality of life in these areas.  A difference of  2-3 points is a clinically meaningful difference.  A difference of 2-3 points in the total score of the Quality of Life Index has been associated with significant improvement in overall quality of life, self-image, physical symptoms, and general health in studies assessing change in quality of life.  PHQ-9: Recent Review Flowsheet Data    Depression screen [facility] 07/25/2019 05/08/2019   Decreased Interest 0 0   Down, Depressed, Hopeless 0 0   PHQ - 2 Score 0 0   Altered sleeping - 0   Tired, decreased energy - 0   Change in appetite - 0   Feeling bad or  failure about yourself  - 0   Trouble concentrating - 0   Moving slowly or fidgety/restless - 0   Suicidal thoughts - 0   PHQ-9 Score - 0     Interpretation of Total Score  Total Score Depression Severity:  1-4 = Minimal depression, 5-9 = Mild depression, 10-14 = Moderate depression, 15-19 = Moderately severe  depression, 20-27 = Severe depression   Psychosocial Evaluation and Intervention:   Psychosocial Re-Evaluation:   Psychosocial Discharge (Final Psychosocial Re-Evaluation):   Vocational Rehabilitation: Provide vocational rehab assistance to qualifying candidates.   Vocational Rehab Evaluation & Intervention: Vocational Rehab - 07/25/19 1239      Initial Vocational Rehab Evaluation & Intervention   Assessment shows need for Vocational Rehabilitation  --   Offered vocational rehab packet to the patient. Declined at this time may ask later      Education: Education Goals: Education classes will be provided on a weekly basis, covering required topics. Participant will state understanding/return demonstration of topics presented.  Learning Barriers/Preferences: Learning Barriers/Preferences - 07/25/19 1154      Learning Barriers/Preferences   Learning Barriers  Sight    Learning Preferences  Video;Skilled Demonstration;Verbal Instruction;Audio;Pictoral       Education Topics: Hypertension, Hypertension Reduction -Define heart disease and high blood pressure. Discus how high blood pressure affects the body and ways to reduce high blood pressure.   Exercise and Your Heart -Discuss why it is important to exercise, the FITT principles of exercise, normal and abnormal responses to exercise, and how to exercise safely.   Angina -Discuss definition of angina, causes of angina, treatment of angina, and how to decrease risk of having angina.   Cardiac Medications -Review what the following cardiac medications are used for, how they affect the body, and side effects  that may occur when taking the medications.  Medications include Aspirin, Beta blockers, calcium channel blockers, ACE Inhibitors, angiotensin receptor blockers, diuretics, digoxin, and antihyperlipidemics.   Congestive Heart Failure -Discuss the definition of CHF, how to live with CHF, the signs and symptoms of CHF, and how keep track of weight and sodium intake.   Heart Disease and Intimacy -Discus the effect sexual activity has on the heart, how changes occur during intimacy as we age, and safety during sexual activity.   Smoking Cessation / COPD -Discuss different methods to quit smoking, the health benefits of quitting smoking, and the definition of COPD.   Nutrition I: Fats -Discuss the types of cholesterol, what cholesterol does to the heart, and how cholesterol levels can be controlled.   Nutrition II: Labels -Discuss the different components of food labels and how to read food label   Heart Parts/Heart Disease and PAD -Discuss the anatomy of the heart, the pathway of blood circulation through the heart, and these are affected by heart disease.   Stress I: Signs and Symptoms -Discuss the causes of stress, how stress may lead to anxiety and depression, and ways to limit stress.   Stress II: Relaxation -Discuss different types of relaxation techniques to limit stress.   Warning Signs of Stroke / TIA -Discuss definition of a stroke, what the signs and symptoms are of a stroke, and how to identify when someone is having stroke.   Knowledge Questionnaire Score: Knowledge Questionnaire Score - 07/25/19 1012      Knowledge Questionnaire Score   Pre Score  21/28       Core Components/Risk Factors/Patient Goals at Admission: Personal Goals and Risk Factors at Admission - 07/25/19 1241      Core Components/Risk Factors/Patient Goals on Admission    Weight Management  Yes;Weight Maintenance    Intervention  Weight Management: Develop a combined nutrition and exercise  program designed to reach desired caloric intake, while maintaining appropriate intake of nutrient and fiber, sodium and fats, and appropriate energy expenditure required for the weight goal.;Weight Management: Provide education  and appropriate resources to help participant work on and attain dietary goals.    Admit Weight  182 lb 5.1 oz (82.7 kg)    Expected Outcomes  Short Term: Continue to assess and modify interventions until short term weight is achieved;Long Term: Adherence to nutrition and physical activity/exercise program aimed toward attainment of established weight goal;Weight Maintenance: Understanding of the daily nutrition guidelines, which includes 25-35% calories from fat, 7% or less cal from saturated fats, less than 227m cholesterol, less than 1.5gm of sodium, & 5 or more servings of fruits and vegetables daily;Understanding recommendations for meals to include 15-35% energy as protein, 25-35% energy from fat, 35-60% energy from carbohydrates, less than 2014mof dietary cholesterol, 20-35 gm of total fiber daily;Understanding of distribution of calorie intake throughout the day with the consumption of 4-5 meals/snacks    Tobacco Cessation  Yes   Patient quit on 04/08/19   Intervention  Offer self-teaching materials, assist with locating and accessing local/national Quit Smoking programs, and support quit date choice.    Expected Outcomes  Long Term: Complete abstinence from all tobacco products for at least 12 months from quit date.    Diabetes  Yes    Intervention  Provide education about signs/symptoms and action to take for hypo/hyperglycemia.;Provide education about proper nutrition, including hydration, and aerobic/resistive exercise prescription along with prescribed medications to achieve blood glucose in normal ranges: Fasting glucose 65-99 mg/dL    Expected Outcomes  Short Term: Participant verbalizes understanding of the signs/symptoms and immediate care of hyper/hypoglycemia,  proper foot care and importance of medication, aerobic/resistive exercise and nutrition plan for blood glucose control.;Long Term: Attainment of HbA1C < 7%.    Heart Failure  Yes    Intervention  --   Patient will monitor weight, follow a low sodium heart healthy diet and exercise at cardiac rehab and at home   Expected Outcomes  Short term: Attendance in program 2-3 days a week with increased exercise capacity. Reported lower sodium intake. Reported increased fruit and vegetable intake. Reports medication compliance.;Short term: Daily weights obtained and reported for increase. Utilizing diuretic protocols set by physician.;Long term: Adoption of self-care skills and reduction of barriers for early signs and symptoms recognition and intervention leading to self-care maintenance.    Lipids  Yes    Intervention  Provide education and support for participant on nutrition & aerobic/resistive exercise along with prescribed medications to achieve LDL <7060mHDL >60m7m  Expected Outcomes  Short Term: Participant states understanding of desired cholesterol values and is compliant with medications prescribed. Participant is following exercise prescription and nutrition guidelines.;Long Term: Cholesterol controlled with medications as prescribed, with individualized exercise RX and with personalized nutrition plan. Value goals: LDL < 70mg25mL > 40 mg.    Stress  Yes    Intervention  Offer individual and/or small group education and counseling on adjustment to heart disease, stress management and health-related lifestyle change. Teach and support self-help strategies.;Refer participants experiencing significant psychosocial distress to appropriate mental health specialists for further evaluation and treatment. When possible, include family members and significant others in education/counseling sessions.    Expected Outcomes  Short Term: Participant demonstrates changes in health-related behavior, relaxation and  other stress management skills, ability to obtain effective social support, and compliance with psychotropic medications if prescribed.;Long Term: Emotional wellbeing is indicated by absence of clinically significant psychosocial distress or social isolation.       Core Components/Risk Factors/Patient Goals Review:    Core Components/Risk Factors/Patient Goals at  Discharge (Final Review):    ITP Comments: ITP Comments    Row Name 07/25/19 1223           ITP Comments  Dr Fransico Him MD, Medical Director          Comments: Alvester Chou  attended orientation on 07/25/2019 to review rules and guidelines for program.  Completed 6 minute walk test, Intitial ITP, and exercise prescription.  VSS. Telemetry-Sinus Rhythm.  Asymptomatic. Safety measures and social distancing in place per CDC guidelines.Patient to go community health and wellness to pick up CBG meter.Offered Mining engineer. Mr Vea declined at this time. Mr Tugwell wants to wait to see if he can qualify to receive disability.Barnet Pall, RN,BSN 07/25/2019 12:57 PM

## 2019-07-26 ENCOUNTER — Other Ambulatory Visit (HOSPITAL_COMMUNITY): Payer: Medicaid Other

## 2019-07-29 ENCOUNTER — Other Ambulatory Visit: Payer: Self-pay

## 2019-07-29 ENCOUNTER — Encounter (HOSPITAL_COMMUNITY)
Admission: RE | Admit: 2019-07-29 | Discharge: 2019-07-29 | Disposition: A | Discharge status: Home / Self Care | Payer: Medicaid Other | Source: Ambulatory Visit | Attending: Cardiovascular Disease | Admitting: Cardiovascular Disease

## 2019-07-29 DIAGNOSIS — I2102 ST elevation (STEMI) myocardial infarction involving left anterior descending coronary artery: Secondary | ICD-10-CM | POA: Diagnosis not present

## 2019-07-29 DIAGNOSIS — Z955 Presence of coronary angioplasty implant and graft: Secondary | ICD-10-CM

## 2019-07-29 LAB — GLUCOSE, CAPILLARY: Glucose-Capillary: 109 mg/dL — ABNORMAL HIGH (ref 70–99)

## 2019-07-29 NOTE — Progress Notes (Signed)
Daily Session Note  Patient Details  Name: Stephen Jennings MRN: [id number] Date of Birth: 19-Apr-1956 Referring Provider:     CARDIAC REHAB PHASE II ORIENTATION from 07/25/2019 in [place]  Referring Provider  Dr. Shelva Majestic      Encounter Date: 07/29/2019  Check In:   Capillary Blood Glucose: No results found for this or any previous visit (from the past 24 hour(s)).    Social History   Tobacco Use  Smoking Status Former Smoker  . Quit date: 04/08/2019  . Years since quitting: 0.3  Smokeless Tobacco Never Used    Goals Met:  Exercise tolerated well No report of cardiac concerns or symptoms Strength training completed today  Goals Unmet:  Not Applicable  Comments: [facility] started cardiac rehab today.  Pt tolerated light exercise without difficulty. VSS, telemetry-Sinus Rhythm, asymptomatic.  Medication list reconciled. Pt denies barriers to medicaiton compliance.  PSYCHOSOCIAL ASSESSMENT:  PHQ-0. Pt exhibits positive coping skills, hopeful outlook with supportive family. No psychosocial needs identified at this time, no psychosocial interventions necessary.    Pt enjoys fishing.   Pt oriented to exercise equipment and routine.    Understanding verbalized.Barnet Pall, RN,BSN 07/30/2019 2:33 PM   Dr. Fransico Him is Medical Director for Cardiac Rehab at [facility].

## 2019-07-30 LAB — GLUCOSE, CAPILLARY: Glucose-Capillary: 121 mg/dL — ABNORMAL HIGH (ref 70–99)

## 2019-07-31 ENCOUNTER — Encounter (HOSPITAL_COMMUNITY)
Admission: RE | Admit: 2019-07-31 | Discharge: 2019-07-31 | Disposition: A | Discharge status: Home / Self Care | Payer: Medicaid Other | Source: Ambulatory Visit | Attending: Cardiovascular Disease | Admitting: Cardiovascular Disease

## 2019-07-31 ENCOUNTER — Other Ambulatory Visit: Payer: Self-pay

## 2019-07-31 DIAGNOSIS — I2102 ST elevation (STEMI) myocardial infarction involving left anterior descending coronary artery: Secondary | ICD-10-CM

## 2019-07-31 DIAGNOSIS — Z955 Presence of coronary angioplasty implant and graft: Secondary | ICD-10-CM

## 2019-07-31 LAB — GLUCOSE, CAPILLARY
Glucose-Capillary: 164 mg/dL — ABNORMAL HIGH (ref 70–99)
Glucose-Capillary: 94 mg/dL (ref 70–99)

## 2019-08-01 NOTE — Progress Notes (Signed)
Cardiac Individual Treatment Plan  Patient Details  Name: Stephen Jennings MRN: [id number] Date of Birth: 02/21/1956 Referring Provider:     [place] from 07/25/2019 in [place]  Referring Provider  Dr. Shelva Majestic      Initial Encounter Date:    [place] from 07/25/2019 in [place]  Date  07/25/19      Visit Diagnosis: ST elevation myocardial infarction involving left anterior descending (LAD) coronary artery ([place]) 03/31/19  S/P coronary artery stent placement S/P DES LAD 03/31/19  Patient's Home Medications on Admission:  Current Outpatient Medications:  .  aspirin 81 MG chewable tablet, Chew 1 tablet (81 mg total) by mouth daily., Disp: 30 tablet, Rfl: 2 .  atorvastatin (LIPITOR) 80 MG tablet, Take 1 tablet (80 mg total) by mouth daily at 6 PM., Disp: 30 tablet, Rfl: 2 .  Blood Glucose Monitoring Suppl (TRUE METRIX METER) w/Device KIT, Use to measure blood sugar twice a day, Disp: 1 kit, Rfl: 0 .  carvedilol (COREG) 3.125 MG tablet, Take 1 tablet (3.125 mg total) by mouth 2 (two) times daily with a meal., Disp: 60 tablet, Rfl: 2 .  digoxin (LANOXIN) 0.125 MG tablet, Take 1 tablet (0.125 mg total) by mouth daily., Disp: 30 tablet, Rfl: 2 .  glucose blood (TRUE METRIX BLOOD GLUCOSE TEST) test strip, Use as instructed, Disp: 100 each, Rfl: 12 .  Hyprom-Naphaz-Polysorb-Zn Sulf (CLEAR EYES COMPLETE) SOLN, Apply 1 drop to eye 3 (three) times daily., Disp: 15 mL, Rfl: 1 .  losartan (COZAAR) 25 MG tablet, Take 1 tablet (25 mg total) by mouth at bedtime., Disp: 30 tablet, Rfl: 5 .  metFORMIN (GLUCOPHAGE) 500 MG tablet, Take 1 tablet (500 mg total) by mouth 2 (two) times daily with a meal., Disp: 60 tablet, Rfl: 2 .  nitroGLYCERIN (NITROSTAT) 0.4 MG SL tablet, Place 1 tablet (0.4 mg total) under the tongue every 5 (five) minutes x 3 doses as needed for chest pain., Disp: 25 tablet,  Rfl: 12 .  spironolactone (ALDACTONE) 25 MG tablet, Take 1 tablet (25 mg total) by mouth daily., Disp: 30 tablet, Rfl: 2 .  ticagrelor (BRILINTA) 90 MG TABS tablet, Take 1 tablet (90 mg total) by mouth 2 (two) times daily., Disp: 60 tablet, Rfl: 2 .  TRUEplus Lancets 28G MISC, Use to measure blood sugar twice a day, Disp: 100 each, Rfl: 1  Past Medical History: Past Medical History:  Diagnosis Date  . Cardiogenic shock ([place])   . CHF (congestive heart failure) ([place])   . Coronary artery disease   . Diabetes mellitus without complication ([place])   . Hyperlipidemia   . Medical history non-contributory     Tobacco Use: Social History   Tobacco Use  Smoking Status Former Smoker  . Quit date: 04/08/2019  . Years since quitting: 0.3  Smokeless Tobacco Never Used    Labs: Recent Review Scientist, physiological    Labs for ITP Cardiac and Pulmonary Rehab Latest Ref Rng & Units 04/09/2019 04/09/2019 04/10/2019 04/11/2019 04/12/2019   Cholestrol 0 - 200 mg/dL - - - - -   LDLCALC 0 - 99 mg/dL - - - - -   HDL >40 mg/dL - - - - -   Trlycerides <150 mg/dL - - - - 99   Hemoglobin A1c 4.8 - 5.6 % - - - - -   PHART 7.350 - 7.450 - - - - -   PCO2ART  32.0 - 48.0 mmHg - - - - -   HCO3 20.0 - 28.0 mmol/L - - - - -   TCO2 22 - 32 mmol/L - - - - -   ACIDBASEDEF 0.0 - 2.0 mmol/L - - - - -   O2SAT % 83.8 59.8 61.4 58.4 55.3      Capillary Blood Glucose: Lab Results  Component Value Date   GLUCAP 94 07/31/2019   GLUCAP 164 (H) 07/31/2019   GLUCAP 121 (H) 07/29/2019   GLUCAP 109 (H) 07/29/2019   GLUCAP 111 (H) 07/25/2019     Exercise Target Goals: Exercise Program Goal: Individual exercise prescription set using results from initial 6 min walk test and THRR while considering  patient's activity barriers and safety.   Exercise Prescription Goal: Starting with aerobic activity 30 plus minutes a day, 3 days per week for initial exercise prescription. Provide home exercise prescription and guidelines that  participant acknowledges understanding prior to discharge.  Activity Barriers & Risk Stratification: Activity Barriers & Cardiac Risk Stratification - 07/25/19 1143      Activity Barriers & Cardiac Risk Stratification   Activity Barriers  Deconditioning;Muscular Weakness    Cardiac Risk Stratification  High       6 Minute Walk: 6 Minute Walk    Row Name 07/25/19 1142         6 Minute Walk   Phase  Initial     Distance  1376 feet     Walk Time  6 minutes     # of Rest Breaks  0     MPH  2.6     METS  3.3     RPE  9     Perceived Dyspnea   0     VO2 Peak  11.79     Symptoms  No     Resting HR  60 bpm     Resting BP  122/64     Resting Oxygen Saturation   98 %     Exercise Oxygen Saturation  during 6 min walk  100 %     Max Ex. HR  76 bpm     Max Ex. BP  132/72     2 Minute Post BP  124/68        Oxygen Initial Assessment:   Oxygen Re-Evaluation:   Oxygen Discharge (Final Oxygen Re-Evaluation):   Initial Exercise Prescription: Initial Exercise Prescription - 07/25/19 1100      Date of Initial Exercise RX and Referring Provider   Date  07/25/19    Referring Provider  Dr. Shelva Majestic    Expected Discharge Date  09/20/19      Recumbant Bike   Level  2    Watts  35    Minutes  15    METs  3.38      NuStep   Level  3    SPM  85    Minutes  15    METs  3      Prescription Details   Frequency (times per week)  3    Duration  Progress to 30 minutes of continuous aerobic without signs/symptoms of physical distress      Intensity   THRR 40-80% of Max Heartrate  63-126    Ratings of Perceived Exertion  11-13      Progression   Progression  Continue to progress workloads to maintain intensity without signs/symptoms of physical distress.      Horticulturist, commercial Prescription  Yes    Weight  3 lbs.     Reps  10-15       Perform Capillary Blood Glucose checks as needed.  Exercise Prescription Changes: Exercise Prescription Changes     Row Name 07/29/19 1000             Response to Exercise   Blood Pressure (Admit)  130/78       Blood Pressure (Exercise)  132/60       Blood Pressure (Exit)  130/72       Heart Rate (Admit)  64 bpm       Heart Rate (Exercise)  97 bpm       Heart Rate (Exit)  69 bpm       Rating of Perceived Exertion (Exercise)  11       Symptoms  None       Comments  Pt first day of exercise.        Duration  Continue with 30 min of aerobic exercise without signs/symptoms of physical distress.       Intensity  THRR unchanged         Progression   Progression  Continue to progress workloads to maintain intensity without signs/symptoms of physical distress.       Average METs  2.1         Resistance Training   Training Prescription  Yes       Weight  3 lbs.        Reps  10-15       Time  10 Minutes         Recumbant Bike   Level  2       Watts  35       Minutes  15       METs  1.9         NuStep   Level  3       SPM  85       Minutes  15       METs  2.4          Exercise Comments: Exercise Comments    Row Name 07/29/19 1027           Exercise Comments  Pt first day of exercise. Pt tolerated exercise Rx well.          Exercise Goals and Review: Exercise Goals    Row Name 07/25/19 1153             Exercise Goals   Increase Physical Activity  Yes       Intervention  Provide advice, education, support and counseling about physical activity/exercise needs.;Develop an individualized exercise prescription for aerobic and resistive training based on initial evaluation findings, risk stratification, comorbidities and participant's personal goals.       Expected Outcomes  Short Term: Attend rehab on a regular basis to increase amount of physical activity.;Long Term: Add in home exercise to make exercise part of routine and to increase amount of physical activity.;Long Term: Exercising regularly at least 3-5 days a week.       Increase Strength and Stamina  Yes       Intervention   Provide advice, education, support and counseling about physical activity/exercise needs.;Develop an individualized exercise prescription for aerobic and resistive training based on initial evaluation findings, risk stratification, comorbidities and participant's personal goals.       Expected Outcomes  Short Term: Increase workloads from initial exercise prescription for resistance, speed,  and METs.;Short Term: Perform resistance training exercises routinely during rehab and add in resistance training at home;Long Term: Improve cardiorespiratory fitness, muscular endurance and strength as measured by increased METs and functional capacity (6MWT)       Able to understand and use rate of perceived exertion (RPE) scale  Yes       Intervention  Provide education and explanation on how to use RPE scale       Expected Outcomes  Short Term: Able to use RPE daily in rehab to express subjective intensity level;Long Term:  Able to use RPE to guide intensity level when exercising independently       Knowledge and understanding of Target Heart Rate Range (THRR)  Yes       Intervention  Provide education and explanation of THRR including how the numbers were predicted and where they are located for reference       Expected Outcomes  Short Term: Able to state/look up THRR;Long Term: Able to use THRR to govern intensity when exercising independently;Short Term: Able to use daily as guideline for intensity in rehab       Able to check pulse independently  Yes       Intervention  Provide education and demonstration on how to check pulse in carotid and radial arteries.;Review the importance of being able to check your own pulse for safety during independent exercise       Expected Outcomes  Short Term: Able to explain why pulse checking is important during independent exercise;Long Term: Able to check pulse independently and accurately       Understanding of Exercise Prescription  Yes       Intervention  Provide  education, explanation, and written materials on patient's individual exercise prescription       Expected Outcomes  Short Term: Able to explain program exercise prescription;Long Term: Able to explain home exercise prescription to exercise independently          Exercise Goals Re-Evaluation : Exercise Goals Re-Evaluation    Row Name 07/29/19 1025             Exercise Goal Re-Evaluation   Exercise Goals Review  Increase Physical Activity;Increase Strength and Stamina;Able to understand and use rate of perceived exertion (RPE) scale;Knowledge and understanding of Target Heart Rate Range (THRR);Understanding of Exercise Prescription       Comments  Pt first day of exercsie in [facility] program. Pt tolerated exercise Rx well. Pt understands THRR, RPE scale, and exercise Rx.       Expected Outcomes  Will continue to monitor and progress Pt as tolerated.           Discharge Exercise Prescription (Final Exercise Prescription Changes): Exercise Prescription Changes - 07/29/19 1000      Response to Exercise   Blood Pressure (Admit)  130/78    Blood Pressure (Exercise)  132/60    Blood Pressure (Exit)  130/72    Heart Rate (Admit)  64 bpm    Heart Rate (Exercise)  97 bpm    Heart Rate (Exit)  69 bpm    Rating of Perceived Exertion (Exercise)  11    Symptoms  None    Comments  Pt first day of exercise.     Duration  Continue with 30 min of aerobic exercise without signs/symptoms of physical distress.    Intensity  THRR unchanged      Progression   Progression  Continue to progress workloads to maintain intensity without signs/symptoms of physical distress.  Average METs  2.1      Resistance Training   Training Prescription  Yes    Weight  3 lbs.     Reps  10-15    Time  10 Minutes      Recumbant Bike   Level  2    Watts  35    Minutes  15    METs  1.9      NuStep   Level  3    SPM  85    Minutes  15    METs  2.4       Nutrition:  Target Goals: Understanding of nutrition  guidelines, daily intake of sodium <1578m, cholesterol <2084m calories 30% from fat and 7% or less from saturated fats, daily to have 5 or more servings of fruits and vegetables.  Biometrics: Pre Biometrics - 07/25/19 1153      Pre Biometrics   Height  5' 10.75" (1.797 m)    Weight  82.7 kg    Waist Circumference  38 inches    Hip Circumference  39.5 inches    Waist to Hip Ratio  0.96 %    BMI (Calculated)  25.61    Triceps Skinfold  18 mm    % Body Fat  26.2 %    Grip Strength  37 kg    Flexibility  13.25 in    Single Leg Stand  14.12 seconds        Nutrition Therapy Plan and Nutrition Goals:   Nutrition Assessments:   Nutrition Goals Re-Evaluation:   Nutrition Goals Discharge (Final Nutrition Goals Re-Evaluation):   Psychosocial: Target Goals: Acknowledge presence or absence of significant depression and/or stress, maximize coping skills, provide positive support system. Participant is able to verbalize types and ability to use techniques and skills needed for reducing stress and depression.  Initial Review & Psychosocial Screening: Initial Psych Review & Screening - 07/25/19 1003      Initial Review   Current issues with  Current Abuse or Neglect to Report;Current Stress Concerns    Source of Stress Concerns  Chronic Illness;Occupation;Financial    Comments  [place] stress related to his CV event and lack of occupation.      Family Dynamics   Good Support System?  Yes   Pt states his wife is a source of support.     Barriers   Psychosocial barriers to participate in program  The patient should benefit from training in stress management and relaxation.      Screening Interventions   Interventions  Encouraged to exercise       Quality of Life Scores: Quality of Life - 07/25/19 0958      Quality of Life   Select  Quality of Life      Quality of Life Scores   Health/Function Pre  24.63 %    Socioeconomic Pre  17.81 %    Psych/Spiritual Pre  29.14 %     Family Pre  27.6 %    GLOBAL Pre  24.4 %      Scores of 19 and below usually indicate a poorer quality of life in these areas.  A difference of  2-3 points is a clinically meaningful difference.  A difference of 2-3 points in the total score of the Quality of Life Index has been associated with significant improvement in overall quality of life, self-image, physical symptoms, and general health in studies assessing change in quality of life.  PHQ-9: Recent Review Flowsheet Data  Depression screen [facility] 2/9 07/25/2019 05/08/2019   Decreased Interest 0 0   Down, Depressed, Hopeless 0 0   PHQ - 2 Score 0 0   Altered sleeping - 0   Tired, decreased energy - 0   Change in appetite - 0   Feeling bad or failure about yourself  - 0   Trouble concentrating - 0   Moving slowly or fidgety/restless - 0   Suicidal thoughts - 0   PHQ-9 Score - 0     Interpretation of Total Score  Total Score Depression Severity:  1-4 = Minimal depression, 5-9 = Mild depression, 10-14 = Moderate depression, 15-19 = Moderately severe depression, 20-27 = Severe depression   Psychosocial Evaluation and Intervention:   Psychosocial Re-Evaluation: Psychosocial Re-Evaluation    [place] Name 08/01/19 1506             Psychosocial Re-Evaluation   Current issues with  Current Stress Concerns       Comments  Will continue to       Interventions  Encouraged to attend Cardiac Rehabilitation for the exercise;Stress management education       Continue Psychosocial Services   Follow up required by staff       Comments  Kaevon has stress related to his CV event and lack of occupation.         Initial Review   Source of Stress Concerns  Financial          Psychosocial Discharge (Final Psychosocial Re-Evaluation): Psychosocial Re-Evaluation - 08/01/19 1506      Psychosocial Re-Evaluation   Current issues with  Current Stress Concerns    Comments  Will continue to    Interventions  Encouraged to attend Cardiac  Rehabilitation for the exercise;Stress management education    Continue Psychosocial Services   Follow up required by staff    Comments  [place] has stress related to his CV event and lack of occupation.      Initial Review   Source of Stress Concerns  Financial       Vocational Rehabilitation: Provide vocational rehab assistance to qualifying candidates.   Vocational Rehab Evaluation & Intervention: Vocational Rehab - 07/25/19 1239      Initial Vocational Rehab Evaluation & Intervention   Assessment shows need for Vocational Rehabilitation  --   Offered vocational rehab packet to the patient. Declined at this time may ask later      Education: Education Goals: Education classes will be provided on a weekly basis, covering required topics. Participant will state understanding/return demonstration of topics presented.  Learning Barriers/Preferences: Learning Barriers/Preferences - 07/25/19 1154      Learning Barriers/Preferences   Learning Barriers  Sight    Learning Preferences  Video;Skilled Demonstration;Verbal Instruction;Audio;Pictoral       Education Topics: Hypertension, Hypertension Reduction -Define heart disease and high blood pressure. Discus how high blood pressure affects the body and ways to reduce high blood pressure.   Exercise and Your Heart -Discuss why it is important to exercise, the FITT principles of exercise, normal and abnormal responses to exercise, and how to exercise safely.   Angina -Discuss definition of angina, causes of angina, treatment of angina, and how to decrease risk of having angina.   Cardiac Medications -Review what the following cardiac medications are used for, how they affect the body, and side effects that may occur when taking the medications.  Medications include Aspirin, Beta blockers, calcium channel blockers, ACE Inhibitors, angiotensin receptor blockers, diuretics, digoxin, and antihyperlipidemics.  Congestive Heart  Failure -Discuss the definition of CHF, how to live with CHF, the signs and symptoms of CHF, and how keep track of weight and sodium intake.   Heart Disease and Intimacy -Discus the effect sexual activity has on the heart, how changes occur during intimacy as we age, and safety during sexual activity.   Smoking Cessation / COPD -Discuss different methods to quit smoking, the health benefits of quitting smoking, and the definition of COPD.   Nutrition I: Fats -Discuss the types of cholesterol, what cholesterol does to the heart, and how cholesterol levels can be controlled.   Nutrition II: Labels -Discuss the different components of food labels and how to read food label   Heart Parts/Heart Disease and PAD -Discuss the anatomy of the heart, the pathway of blood circulation through the heart, and these are affected by heart disease.   Stress I: Signs and Symptoms -Discuss the causes of stress, how stress may lead to anxiety and depression, and ways to limit stress.   Stress II: Relaxation -Discuss different types of relaxation techniques to limit stress.   Warning Signs of Stroke / TIA -Discuss definition of a stroke, what the signs and symptoms are of a stroke, and how to identify when someone is having stroke.   Knowledge Questionnaire Score: Knowledge Questionnaire Score - 07/25/19 1012      Knowledge Questionnaire Score   Pre Score  21/28       Core Components/Risk Factors/Patient Goals at Admission: Personal Goals and Risk Factors at Admission - 07/25/19 1241      Core Components/Risk Factors/Patient Goals on Admission    Weight Management  Yes;Weight Maintenance    Intervention  Weight Management: Develop a combined nutrition and exercise program designed to reach desired caloric intake, while maintaining appropriate intake of nutrient and fiber, sodium and fats, and appropriate energy expenditure required for the weight goal.;Weight Management: Provide education  and appropriate resources to help participant work on and attain dietary goals.    Admit Weight  182 lb 5.1 oz (82.7 kg)    Expected Outcomes  Short Term: Continue to assess and modify interventions until short term weight is achieved;Long Term: Adherence to nutrition and physical activity/exercise program aimed toward attainment of established weight goal;Weight Maintenance: Understanding of the daily nutrition guidelines, which includes 25-35% calories from fat, 7% or less cal from saturated fats, less than 258m cholesterol, less than 1.5gm of sodium, & 5 or more servings of fruits and vegetables daily;Understanding recommendations for meals to include 15-35% energy as protein, 25-35% energy from fat, 35-60% energy from carbohydrates, less than 2078mof dietary cholesterol, 20-35 gm of total fiber daily;Understanding of distribution of calorie intake throughout the day with the consumption of 4-5 meals/snacks    Tobacco Cessation  Yes   Patient quit on 04/08/19   Intervention  Offer self-teaching materials, assist with locating and accessing local/national Quit Smoking programs, and support quit date choice.    Expected Outcomes  Long Term: Complete abstinence from all tobacco products for at least 12 months from quit date.    Diabetes  Yes    Intervention  Provide education about signs/symptoms and action to take for hypo/hyperglycemia.;Provide education about proper nutrition, including hydration, and aerobic/resistive exercise prescription along with prescribed medications to achieve blood glucose in normal ranges: Fasting glucose 65-99 mg/dL    Expected Outcomes  Short Term: Participant verbalizes understanding of the signs/symptoms and immediate care of hyper/hypoglycemia, proper foot care and importance of medication, aerobic/resistive exercise  and nutrition plan for blood glucose control.;Long Term: Attainment of HbA1C < 7%.    Heart Failure  Yes    Intervention  --   Patient will monitor  weight, follow a low sodium heart healthy diet and exercise at cardiac rehab and at home   Expected Outcomes  Short term: Attendance in program 2-3 days a week with increased exercise capacity. Reported lower sodium intake. Reported increased fruit and vegetable intake. Reports medication compliance.;Short term: Daily weights obtained and reported for increase. Utilizing diuretic protocols set by physician.;Long term: Adoption of self-care skills and reduction of barriers for early signs and symptoms recognition and intervention leading to self-care maintenance.    Lipids  Yes    Intervention  Provide education and support for participant on nutrition & aerobic/resistive exercise along with prescribed medications to achieve LDL <37m, HDL >425m    Expected Outcomes  Short Term: Participant states understanding of desired cholesterol values and is compliant with medications prescribed. Participant is following exercise prescription and nutrition guidelines.;Long Term: Cholesterol controlled with medications as prescribed, with individualized exercise RX and with personalized nutrition plan. Value goals: LDL < 7055mHDL > 40 mg.    Stress  Yes    Intervention  Offer individual and/or small group education and counseling on adjustment to heart disease, stress management and health-related lifestyle change. Teach and support self-help strategies.;Refer participants experiencing significant psychosocial distress to appropriate mental health specialists for further evaluation and treatment. When possible, include family members and significant others in education/counseling sessions.    Expected Outcomes  Short Term: Participant demonstrates changes in health-related behavior, relaxation and other stress management skills, ability to obtain effective social support, and compliance with psychotropic medications if prescribed.;Long Term: Emotional wellbeing is indicated by absence of clinically significant  psychosocial distress or social isolation.       Core Components/Risk Factors/Patient Goals Review:  Goals and Risk Factor Review    Row Name 07/30/19 1435 07/30/19 1436           Core Components/Risk Factors/Patient Goals Review   Personal Goals Review  Weight Management/Obesity;Lipids;Tobacco Cessation;Diabetes;Hypertension;Stress  Weight Management/Obesity;Lipids;Tobacco Cessation;Diabetes;Hypertension;Stress      Review  --  BarJosianarted exercise on 07/29/19 and exercised without difficulty.      Expected Outcomes  --  BarToanll continue to particpate in phase 2 cardiac rehab for exercise, nutrition and lifestyle modifications.         Core Components/Risk Factors/Patient Goals at Discharge (Final Review):  Goals and Risk Factor Review - 07/30/19 1436      Core Components/Risk Factors/Patient Goals Review   Personal Goals Review  Weight Management/Obesity;Lipids;Tobacco Cessation;Diabetes;Hypertension;Stress    Review  BarWilmaarted exercise on 07/29/19 and exercised without difficulty.    Expected Outcomes  BarLlewynll continue to particpate in phase 2 cardiac rehab for exercise, nutrition and lifestyle modifications.       ITP Comments: ITP Comments    Row Name 07/25/19 1223 07/30/19 1433         ITP Comments  Dr TraFransico Him, Medical Director  30 Day ITP Review. Patient started exercise on 07/29/19 and exercised without difficulty.         Comments: See ITP comments.MarBarnet PallN,BSN 08/01/2019 3:10 PM

## 2019-08-02 ENCOUNTER — Encounter (HOSPITAL_COMMUNITY)
Admission: RE | Admit: 2019-08-02 | Discharge: 2019-08-02 | Disposition: A | Discharge status: Home / Self Care | Payer: Medicaid Other | Source: Ambulatory Visit | Attending: Cardiovascular Disease | Admitting: Cardiovascular Disease

## 2019-08-02 ENCOUNTER — Other Ambulatory Visit: Payer: Self-pay

## 2019-08-02 DIAGNOSIS — I2102 ST elevation (STEMI) myocardial infarction involving left anterior descending coronary artery: Secondary | ICD-10-CM

## 2019-08-02 DIAGNOSIS — Z955 Presence of coronary angioplasty implant and graft: Secondary | ICD-10-CM

## 2019-08-02 LAB — GLUCOSE, CAPILLARY: Glucose-Capillary: 128 mg/dL — ABNORMAL HIGH (ref 70–99)

## 2019-08-02 NOTE — Progress Notes (Signed)
Stephen Jennings 63 y.o. male Nutrition Note  Visit Diagnosis: ST elevation myocardial infarction involving left anterior descending (LAD) coronary artery ([place]) 03/31/19   Past Medical History:  Diagnosis Date  . Cardiogenic shock ([place])   . CHF (congestive heart failure) ([place])   . Coronary artery disease   . Diabetes mellitus without complication ([place])   . Hyperlipidemia   . Medical history non-contributory      Medications reviewed.   Current Outpatient Medications:  .  aspirin 81 MG chewable tablet, Chew 1 tablet (81 mg total) by mouth daily., Disp: 30 tablet, Rfl: 2 .  atorvastatin (LIPITOR) 80 MG tablet, Take 1 tablet (80 mg total) by mouth daily at 6 PM., Disp: 30 tablet, Rfl: 2 .  Blood Glucose Monitoring Suppl (TRUE METRIX METER) w/Device KIT, Use to measure blood sugar twice a day, Disp: 1 kit, Rfl: 0 .  carvedilol (COREG) 3.125 MG tablet, Take 1 tablet (3.125 mg total) by mouth 2 (two) times daily with a meal., Disp: 60 tablet, Rfl: 2 .  digoxin (LANOXIN) 0.125 MG tablet, Take 1 tablet (0.125 mg total) by mouth daily., Disp: 30 tablet, Rfl: 2 .  glucose blood (TRUE METRIX BLOOD GLUCOSE TEST) test strip, Use as instructed, Disp: 100 each, Rfl: 12 .  Hyprom-Naphaz-Polysorb-Zn Sulf (CLEAR EYES COMPLETE) SOLN, Apply 1 drop to eye 3 (three) times daily., Disp: 15 mL, Rfl: 1 .  losartan (COZAAR) 25 MG tablet, Take 1 tablet (25 mg total) by mouth at bedtime., Disp: 30 tablet, Rfl: 5 .  metFORMIN (GLUCOPHAGE) 500 MG tablet, Take 1 tablet (500 mg total) by mouth 2 (two) times daily with a meal., Disp: 60 tablet, Rfl: 2 .  nitroGLYCERIN (NITROSTAT) 0.4 MG SL tablet, Place 1 tablet (0.4 mg total) under the tongue every 5 (five) minutes x 3 doses as needed for chest pain., Disp: 25 tablet, Rfl: 12 .  spironolactone (ALDACTONE) 25 MG tablet, Take 1 tablet (25 mg total) by mouth daily., Disp: 30 tablet, Rfl: 2 .  ticagrelor (BRILINTA) 90 MG TABS tablet, Take 1 tablet (90 mg total) by mouth 2  (two) times daily., Disp: 60 tablet, Rfl: 2 .  TRUEplus Lancets 28G MISC, Use to measure blood sugar twice a day, Disp: 100 each, Rfl: 1   Ht Readings from Last 1 Encounters:  07/25/19 5' 10.75" (1.797 m)     Wt Readings from Last 3 Encounters:  07/25/19 182 lb 5.1 oz (82.7 kg)  07/16/19 192 lb 9.6 oz (87.4 kg)  07/15/19 190 lb (86.2 kg)     There is no height or weight on file to calculate BMI.   Social History   Tobacco Use  Smoking Status Former Smoker  . Quit date: 04/08/2019  . Years since quitting: 0.3  Smokeless Tobacco Never Used     Lab Results  Component Value Date   CHOL 253 (H) 03/31/2019   Lab Results  Component Value Date   HDL 46 03/31/2019   Lab Results  Component Value Date   LDLCALC 188 (H) 03/31/2019   Lab Results  Component Value Date   TRIG 99 04/12/2019   Lab Results  Component Value Date   CHOLHDL 5.5 03/31/2019     Lab Results  Component Value Date   HGBA1C 6.0 (H) 03/31/2019     CBG (last 3)  Recent Labs    07/31/19 0910 07/31/19 1005  GLUCAP 164* 94     Nutrition Note  Spoke with pt. Nutrition Plan and Nutrition Survey goals  reviewed with pt. Pt is not following a Heart Healthy diet.   Pt has Pre-diabetes. Last A1c indicates blood glucose well-controlled. This Probation officer went over Diabetes Education (foods this influence blood sugar). Pt is in the process of purchasing a blood sugar monitor. Discussed normal fasting blood sugars and instructed pt to check before eating or drinking anything. Pt verbalized understanding. Will provide continued reinforcement and education.   Per discussion, pt does use canned/convenience foods often. Pt does add salt to food. Pt does eat out frequently.   Pt expressed understanding of the information reviewed. Provided printed instructions of process goals including.  Nutrition Diagnosis ? Food-and nutrition-related knowledge deficit related to lack of exposure to information as related to  diagnosis of: ? CVD ? Pre-diabetes  Nutrition Intervention ? Pt's individual nutrition plan reviewed with pt. ? Benefits of adopting Heart Healthy diet discussed when Medficts reviewed.   ? Continue client-centered nutrition education by RD, as part of interdisciplinary care.  Goal(s) ? Pt to build a healthy plate including vegetables, fruits, whole grains, and low-fat dairy products in a heart healthy meal plan.. ? CBG concentrations in the normal range or as close to normal as is safely possible. ? Pt able to name foods that affect blood glucose  Plan:   Will provide client-centered nutrition education as part of interdisciplinary care  Monitor and evaluate progress toward nutrition goal with team.   Michaele Offer, MS, RDN, LDN

## 2019-08-05 ENCOUNTER — Encounter (HOSPITAL_COMMUNITY)
Admission: RE | Admit: 2019-08-05 | Discharge: 2019-08-05 | Disposition: A | Discharge status: Home / Self Care | Payer: Medicaid Other | Source: Ambulatory Visit | Attending: Cardiovascular Disease | Admitting: Cardiovascular Disease

## 2019-08-05 ENCOUNTER — Other Ambulatory Visit: Payer: Self-pay

## 2019-08-05 DIAGNOSIS — I2102 ST elevation (STEMI) myocardial infarction involving left anterior descending coronary artery: Secondary | ICD-10-CM

## 2019-08-05 DIAGNOSIS — Z955 Presence of coronary angioplasty implant and graft: Secondary | ICD-10-CM

## 2019-08-06 LAB — GLUCOSE, CAPILLARY: Glucose-Capillary: 142 mg/dL — ABNORMAL HIGH (ref 70–99)

## 2019-08-07 ENCOUNTER — Encounter (HOSPITAL_COMMUNITY)
Admission: RE | Admit: 2019-08-07 | Discharge: 2019-08-07 | Disposition: A | Discharge status: Home / Self Care | Payer: Medicaid Other | Source: Ambulatory Visit | Attending: Cardiovascular Disease | Admitting: Cardiovascular Disease

## 2019-08-07 ENCOUNTER — Other Ambulatory Visit: Payer: Self-pay

## 2019-08-07 DIAGNOSIS — I2102 ST elevation (STEMI) myocardial infarction involving left anterior descending coronary artery: Secondary | ICD-10-CM

## 2019-08-07 DIAGNOSIS — Z955 Presence of coronary angioplasty implant and graft: Secondary | ICD-10-CM

## 2019-08-08 ENCOUNTER — Other Ambulatory Visit (HOSPITAL_COMMUNITY): Payer: Self-pay | Admitting: Cardiology

## 2019-08-08 MED ORDER — LOSARTAN POTASSIUM 25 MG PO TABS: 25.0000 mg | ORAL_TABLET | Freq: Every day | ORAL | 5 refills | Status: DC

## 2019-08-08 MED ORDER — ATORVASTATIN CALCIUM 80 MG PO TABS: ORAL_TABLET | ORAL | 2 refills | Status: DC

## 2019-08-08 MED ORDER — ASPIRIN 81 MG PO CHEW: CHEWABLE_TABLET | ORAL | 2 refills | Status: DC

## 2019-08-08 MED FILL — DIGOXIN 0.125 MG TABLET: 125 | 30 days supply | Qty: 30 | Fill #0

## 2019-08-08 MED FILL — SPIRONOLACTONE 25 MG TABS: 25 | 30 days supply | Qty: 30 | Fill #0

## 2019-08-08 MED FILL — LOSARTAN POTASSIUM 25 MG TA: 25 | 30 days supply | Qty: 30 | Fill #0

## 2019-08-08 MED FILL — ASPIRIN CHILD 81 MG TAB CHE: 81 | 30 days supply | Qty: 30 | Fill #0

## 2019-08-08 MED FILL — CARVEDILOL 3.125 MG TABLET: 3.125 | 30 days supply | Qty: 60 | Fill #0

## 2019-08-08 MED FILL — ATORVASTATIN 80 MG TABLET: 80 | 30 days supply | Qty: 30 | Fill #0

## 2019-08-09 ENCOUNTER — Other Ambulatory Visit: Payer: Self-pay

## 2019-08-09 ENCOUNTER — Encounter (HOSPITAL_COMMUNITY)
Admission: RE | Admit: 2019-08-09 | Discharge: 2019-08-09 | Disposition: A | Discharge status: Home / Self Care | Payer: Medicaid Other | Source: Ambulatory Visit | Attending: Cardiovascular Disease | Admitting: Cardiovascular Disease

## 2019-08-09 DIAGNOSIS — I2102 ST elevation (STEMI) myocardial infarction involving left anterior descending coronary artery: Secondary | ICD-10-CM | POA: Diagnosis not present

## 2019-08-09 DIAGNOSIS — Z955 Presence of coronary angioplasty implant and graft: Secondary | ICD-10-CM

## 2019-08-09 NOTE — Progress Notes (Signed)
PCP: Patient, No Pcp Per Cardiologist: Dr. Haroldine Laws  HPI:  Stephen Jennings is a83 y/o male with h/o COPD, DM2,  No known cardiac history. Brought to ER on 03/31/19 with respiratory failure/cardiac arrest. Intubated in ER. Initial ECG with anterior ST elevation. Intubated and transported to cath lab emergently.Cath by Dr. Claiborne Billings with 3v CAD and totally occluded prox LAD. LCX anomalous off RCA. Underwent PCI of LAD which was diffusely diseased vessel. Developed cardiogenic shockrequiring pressors and mechanical support with impella. 8/9/2020Echo 20-25%. Hospital course complicated by Torsades. EP consulted placed ICD prior to discharge.   Recently presented to HF Clinic on 06/20/19 for follow-up with Dr. Haroldine Laws. Stated he was doing ok. He reported his legs were still a little weak but able to walk to mailbox and back. No CP, SOB or edema. Taking all meds without problem. Has stopped smoking. No ICD shocks.  Today he returns to HF clinic for pharmacist medication titration. At recent visits to clinic, losartan was changed to Entresto 24/26 mg BID and then increased to Entresto 49/51 mg BID. On 07/22/19 patient called clinic to complain of increased nervousness/jitteriness and difficulty sleeping. He refused to continue Entresto so losartan 25 mg daily was restarted. Today he still has complaints of increased nervousness/jitteriness/restless leg, although he does believe this is better since stopping the Entresto. He also complains of some breast tenderness, which is likely due to the spironolactone. Otherwise, he feels good today. No dizziness, lightheadedness, chest pain or palpitations. His breathing is good, rare SOB/DOE with moderate activity. He weighs himself daily at home and his weight has been stable at 184-187 lbs. No LEE, PND or orthopnea. He does follow a low salt diet and has been making healthier eating choices to help control his T2DM.      Marland Kitchen Shortness of breath/dyspnea on exertion?  Occasional with moderate activity   . Orthopnea/PND? no . Edema? no . Lightheadedness/dizziness? no . Daily weights at home? no . Blood pressure/heart rate monitoring at home? Yes . Following low-sodium/fluid-restricted diet? yes  HF Medications: Carvedilol 3.125 mg BID Losartan 25 mg daily Spironolactone 25 mg daily Digoxin 0.125 mg daily  Has the patient been experiencing any side effects to the medications prescribed? Yes - gynecomastia with spironolactone. Will switch to eplerenone today (see below). Also complains of nervousness/jitteriness/restless leg. There is no clear medication cause upon review of his medication list unless his digoxin level is supratherapeutic. He had already taken digoxin this morning. Will plan to get digoxin level when returns next week for labs.   Does the patient have any problems obtaining medications due to transportation or finances?   No - [place] Medicaid  Understanding of regimen: good Understanding of indications: good Potential of compliance: good Patient understands to avoid NSAIDs. Patient understands to avoid decongestants.    Pertinent Lab Values: . Serum creatinine 0.94, BUN 12, Potassium 4.5, Sodium 140, Magnesium 2.0, Digoxin <0.2 ng/mL (04/06/19); BNP 217.2 pg/mL  Vital Signs: . Weight: 188.4 lbs (last clinic weight: 190) . Blood pressure: 148/84 . Heart rate: 55   Assessment: 1. Cardiac arrest/VT/torsades on 8/15 -Now s/p ICD - Amiodarone discontinued on 06/20/19.  2. Acute systolic HF -> Cardiogenic shock due to acute anterior MI - s/p PCI/DES to LAD on 8/9 -8/9/2020Echo 20-25% - NYHA class II-III symptoms. Euvolemic on exam. - Labs: Scr stable at 0.94, K 4.5 - Vitals: BP 148/84, HR 55 - Not currently taking diuretic.   - Continue carvedilol 3.125 mg BID. HR currently limiting up-titration.  -  Increase losartan to 50 mg daily. Repeat BMET next week. Refuses Entresto due to increased nervousness/jitteriness.  - Stop  taking spironolactone due to gynecomastia and start taking eplerenone 25 mg daily. Repeat BMET next week.  - Continue digoxin 0.125 mg daily. Digoxin level <0.2 ng/mL on 04/06/19. Will check digoxin level next week.  - Would be good candidate for SGLT2i given concomitant HF and T2DM. He will need PA for Farx[facility]) once initiated.    3. CAD with acute anterior MI 8/9 - s/p PCI/DES to LAD. 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 On Asa+ brillinta. - No s/s ischemia - Refer to CR  4. DM2 - new diagnosis - Diabetes Coordinators consulted. Placed on metformin 500 mg twice a day. He will follow up at [facility]. Hgb A1C 6. - As above. Consider SGLT2i eventually  5. COPD - congratulated on smoking cessation   Plan: 1) Medication changes: Based on clinical presentation, vital signs and recent labs will change spironolactone to eplerenone and increase losartan to 50 mg daily.  2) Labs stable: Scr 0.94, K 4.5 3) Follow-up: BMET/digoxin level next week and HF Clinic with Dr. Haroldine Laws on 09/23/19.   Audry Riles, PharmD, BCPS, BCCP, CPP Heart Failure Clinic Pharmacist [phone]

## 2019-08-12 ENCOUNTER — Other Ambulatory Visit (HOSPITAL_COMMUNITY): Payer: Self-pay | Admitting: *Deleted

## 2019-08-12 ENCOUNTER — Ambulatory Visit (HOSPITAL_COMMUNITY)
Admission: RE | Admit: 2019-08-12 | Discharge: 2019-08-12 | Disposition: A | Discharge status: Home / Self Care | Payer: Medicaid Other | Source: Ambulatory Visit | Attending: Internal Medicine | Admitting: Internal Medicine

## 2019-08-12 ENCOUNTER — Other Ambulatory Visit: Payer: Self-pay

## 2019-08-12 ENCOUNTER — Encounter (HOSPITAL_COMMUNITY)
Admission: RE | Admit: 2019-08-12 | Discharge: 2019-08-12 | Disposition: A | Discharge status: Home / Self Care | Payer: Medicaid Other | Source: Ambulatory Visit | Attending: Cardiovascular Disease | Admitting: Cardiovascular Disease

## 2019-08-12 ENCOUNTER — Telehealth (HOSPITAL_COMMUNITY): Payer: Self-pay | Admitting: *Deleted

## 2019-08-12 VITALS — BP 148/84 | HR 55 | Wt 188.4 lb

## 2019-08-12 DIAGNOSIS — E119 Type 2 diabetes mellitus without complications: Secondary | ICD-10-CM | POA: Insufficient documentation

## 2019-08-12 DIAGNOSIS — Z955 Presence of coronary angioplasty implant and graft: Secondary | ICD-10-CM | POA: Diagnosis not present

## 2019-08-12 DIAGNOSIS — I2510 Atherosclerotic heart disease of native coronary artery without angina pectoris: Secondary | ICD-10-CM | POA: Diagnosis not present

## 2019-08-12 DIAGNOSIS — J449 Chronic obstructive pulmonary disease, unspecified: Secondary | ICD-10-CM | POA: Insufficient documentation

## 2019-08-12 DIAGNOSIS — I2102 ST elevation (STEMI) myocardial infarction involving left anterior descending coronary artery: Secondary | ICD-10-CM

## 2019-08-12 DIAGNOSIS — Z87891 Personal history of nicotine dependence: Secondary | ICD-10-CM | POA: Insufficient documentation

## 2019-08-12 DIAGNOSIS — Z8674 Personal history of sudden cardiac arrest: Secondary | ICD-10-CM | POA: Insufficient documentation

## 2019-08-12 DIAGNOSIS — Z79899 Other long term (current) drug therapy: Secondary | ICD-10-CM | POA: Diagnosis not present

## 2019-08-12 DIAGNOSIS — Z95810 Presence of automatic (implantable) cardiac defibrillator: Secondary | ICD-10-CM | POA: Insufficient documentation

## 2019-08-12 DIAGNOSIS — I252 Old myocardial infarction: Secondary | ICD-10-CM | POA: Diagnosis not present

## 2019-08-12 LAB — BASIC METABOLIC PANEL
Anion gap: 8 (ref 5–15)
BUN: 12 mg/dL (ref 8–23)
CO2: 25 mmol/L (ref 22–32)
Calcium: 9.1 mg/dL (ref 8.9–10.3)
Chloride: 107 mmol/L (ref 98–111)
Creatinine, Ser: 0.94 mg/dL (ref 0.61–1.24)
GFR calc Af Amer: >60 mL/min (ref >60)
GFR calc non Af Amer: >60 mL/min (ref >60)
Glucose, Bld: 112 mg/dL — ABNORMAL HIGH (ref 70–99)
Potassium: 4.5 mmol/L (ref 3.5–5.1)
Sodium: 140 mmol/L (ref 135–145)

## 2019-08-12 MED ORDER — EPLERENONE 25 MG PO TABS: 25.0000 mg | ORAL_TABLET | Freq: Every day | ORAL | 11 refills | Status: DC

## 2019-08-12 MED ORDER — LOSARTAN POTASSIUM 50 MG PO TABS: 50.0000 mg | ORAL_TABLET | Freq: Every day | ORAL | 11 refills | Status: DC

## 2019-08-12 MED FILL — LOSARTAN POTASSIUM 50 MG TA: 50 | 30 days supply | Qty: 30 | Fill #0

## 2019-08-12 MED FILL — EPLERENONE 25 MG TABS: 25 | 30 days supply | Qty: 30 | Fill #0

## 2019-08-12 NOTE — Patient Instructions (Signed)
It was a pleasure seeing you today!  MEDICATIONS: -We are changing your medications today -Stop taking spironolactone and start taking eplerenone 25 mg (1 tab) daily - Increase losartan to 50 mg (1 tab) daily. You can take 2 tablets of the 25 mg strength until you run out.  - Try Systane eye drops for dry eyes.  -Call if you have questions about your medications.  LABS: -We will call you if your labs need attention.  NEXT APPOINTMENT: Return to clinic in 1 week for labs and 4 weeks with Dr. Haroldine Laws.  In general, to take care of your heart failure: -Limit your fluid intake to 2 Liters (half-gallon) per day.   -Limit your salt intake to ideally 2-3 grams (2000-3000 mg) per day. -Weigh yourself daily and record, and bring that "weight diary" to your next appointment.  (Weight gain of 2-3 pounds in 1 day typically means fluid weight.) -The medications for your heart are to help your heart and help you live longer.   -Please contact us before stopping any of your heart medications.  Call the clinic at [phone] with questions or to reschedule future appointments.

## 2019-08-12 NOTE — Telephone Encounter (Signed)
Entered in error

## 2019-08-14 ENCOUNTER — Other Ambulatory Visit: Payer: Self-pay

## 2019-08-14 ENCOUNTER — Encounter (HOSPITAL_COMMUNITY)
Admission: RE | Admit: 2019-08-14 | Discharge: 2019-08-14 | Disposition: A | Discharge status: Home / Self Care | Payer: Medicaid Other | Source: Ambulatory Visit | Attending: Cardiovascular Disease | Admitting: Cardiovascular Disease

## 2019-08-14 DIAGNOSIS — I2102 ST elevation (STEMI) myocardial infarction involving left anterior descending coronary artery: Secondary | ICD-10-CM

## 2019-08-14 DIAGNOSIS — Z955 Presence of coronary angioplasty implant and graft: Secondary | ICD-10-CM

## 2019-08-14 NOTE — Progress Notes (Addendum)
Nutrition Note  Spoke with pt today about diabetes management. Reviewed A1C and what that number means as well as target of <7%. Reviewed target fasting blood sugars and 2 hour post prandial. Pt verbalizes "I want to get off the metformin". Pt has met goals set last week. He has eliminated sugary beverages and little debbie cakes. He reports fasting CBG's from past week 130-150 mg/dl. Provided education on macronutrients and carbohydrates impact on blood sugar.  Discussed Plate Method with goal of decreasing dinner carbohydrates to 1/4 plate. He typically eats 1/2 plate of carbs with dinner. Goal to record fasting CBGs daily. Provided handouts. Pt verbalized understanding of material discussed today. Distributed RD contact information.  Michaele Offer, MS, RDN, LDN

## 2019-08-19 ENCOUNTER — Encounter (HOSPITAL_COMMUNITY)
Admission: RE | Admit: 2019-08-19 | Discharge: 2019-08-19 | Disposition: A | Discharge status: Home / Self Care | Payer: Medicaid Other | Source: Ambulatory Visit | Attending: Cardiovascular Disease | Admitting: Cardiovascular Disease

## 2019-08-19 ENCOUNTER — Other Ambulatory Visit: Payer: Self-pay

## 2019-08-19 DIAGNOSIS — Z955 Presence of coronary angioplasty implant and graft: Secondary | ICD-10-CM

## 2019-08-19 DIAGNOSIS — I2102 ST elevation (STEMI) myocardial infarction involving left anterior descending coronary artery: Secondary | ICD-10-CM

## 2019-08-21 ENCOUNTER — Other Ambulatory Visit: Payer: Self-pay

## 2019-08-21 ENCOUNTER — Encounter (HOSPITAL_COMMUNITY)
Admission: RE | Admit: 2019-08-21 | Discharge: 2019-08-21 | Disposition: A | Discharge status: Home / Self Care | Payer: Medicaid Other | Source: Ambulatory Visit | Attending: Cardiovascular Disease | Admitting: Cardiovascular Disease

## 2019-08-21 ENCOUNTER — Other Ambulatory Visit (HOSPITAL_COMMUNITY): Payer: Self-pay

## 2019-08-21 ENCOUNTER — Ambulatory Visit (HOSPITAL_COMMUNITY)
Admission: RE | Admit: 2019-08-21 | Discharge: 2019-08-21 | Disposition: A | Discharge status: Home / Self Care | Payer: Medicaid Other | Source: Ambulatory Visit | Attending: Cardiology | Admitting: Cardiology

## 2019-08-21 DIAGNOSIS — Z955 Presence of coronary angioplasty implant and graft: Secondary | ICD-10-CM

## 2019-08-21 DIAGNOSIS — I5022 Chronic systolic (congestive) heart failure: Secondary | ICD-10-CM | POA: Diagnosis not present

## 2019-08-21 DIAGNOSIS — I2102 ST elevation (STEMI) myocardial infarction involving left anterior descending coronary artery: Secondary | ICD-10-CM

## 2019-08-21 DIAGNOSIS — I2510 Atherosclerotic heart disease of native coronary artery without angina pectoris: Secondary | ICD-10-CM

## 2019-08-21 LAB — BASIC METABOLIC PANEL
Anion gap: 8 (ref 5–15)
BUN: 19 mg/dL (ref 8–23)
CO2: 28 mmol/L (ref 22–32)
Calcium: 9.5 mg/dL (ref 8.9–10.3)
Chloride: 104 mmol/L (ref 98–111)
Creatinine, Ser: 0.99 mg/dL (ref 0.61–1.24)
GFR calc Af Amer: >60 mL/min (ref >60)
GFR calc non Af Amer: >60 mL/min (ref >60)
Glucose, Bld: 116 mg/dL — ABNORMAL HIGH (ref 70–99)
Potassium: 4.6 mmol/L (ref 3.5–5.1)
Sodium: 140 mmol/L (ref 135–145)

## 2019-08-21 LAB — DIGOXIN LEVEL: Digoxin Level: 0.7 ng/mL — ABNORMAL LOW (ref 0.8–2.0)

## 2019-08-26 ENCOUNTER — Encounter (HOSPITAL_COMMUNITY): Payer: Medicaid Other

## 2019-08-26 ENCOUNTER — Telehealth (HOSPITAL_COMMUNITY): Payer: Self-pay | Admitting: *Deleted

## 2019-08-26 NOTE — Progress Notes (Signed)
Cardiac Individual Treatment Plan  Patient Details  Name: Stephen Jennings MRN: [id number] Date of Birth: 1956-07-30 Referring Provider:     [place] from 07/25/2019 in [place]  Referring Provider  Dr. Shelva Majestic      Initial Encounter Date:    [place] from 07/25/2019 in [place]  Date  07/25/19      Visit Diagnosis: ST elevation myocardial infarction involving left anterior descending (LAD) coronary artery ([place]) 03/31/19  S/P coronary artery stent placement S/P DES LAD 03/31/19  Patient's Home Medications on Admission:  Current Outpatient Medications:  .  aspirin 81 MG chewable tablet, CHEW 1 TABLET BY MOUTH DAILY., Disp: 30 tablet, Rfl: 2 .  atorvastatin (LIPITOR) 80 MG tablet, TAKE 1 TABLET BY MOUTH DAILY AT 6 PM., Disp: 30 tablet, Rfl: 2 .  Blood Glucose Monitoring Suppl (TRUE METRIX METER) w/Device KIT, Use to measure blood sugar twice a day, Disp: 1 kit, Rfl: 0 .  carvedilol (COREG) 3.125 MG tablet, TAKE 1 TABLET BY MOUTH 2 TIMES DAILY WITH A MEAL., Disp: 60 tablet, Rfl: 2 .  digoxin (LANOXIN) 0.125 MG tablet, TAKE 1 TABLET BY MOUTH DAILY., Disp: 30 tablet, Rfl: 2 .  eplerenone (INSPRA) 25 MG tablet, Take 1 tablet (25 mg total) by mouth daily., Disp: 30 tablet, Rfl: 11 .  glucose blood (TRUE METRIX BLOOD GLUCOSE TEST) test strip, Use as instructed, Disp: 100 each, Rfl: 12 .  Hyprom-Naphaz-Polysorb-Zn Sulf (CLEAR EYES COMPLETE) SOLN, Apply 1 drop to eye 3 (three) times daily., Disp: 15 mL, Rfl: 1 .  losartan (COZAAR) 50 MG tablet, Take 1 tablet (50 mg total) by mouth at bedtime., Disp: 30 tablet, Rfl: 11 .  metFORMIN (GLUCOPHAGE) 500 MG tablet, Take 1 tablet (500 mg total) by mouth 2 (two) times daily with a meal., Disp: 60 tablet, Rfl: 2 .  nitroGLYCERIN (NITROSTAT) 0.4 MG SL tablet, Place 1 tablet (0.4 mg total) under the tongue every 5 (five) minutes x 3 doses as  needed for chest pain., Disp: 25 tablet, Rfl: 12 .  ticagrelor (BRILINTA) 90 MG TABS tablet, Take 1 tablet (90 mg total) by mouth 2 (two) times daily., Disp: 60 tablet, Rfl: 2 .  TRUEplus Lancets 28G MISC, Use to measure blood sugar twice a day, Disp: 100 each, Rfl: 1  Past Medical History: Past Medical History:  Diagnosis Date  . Cardiogenic shock ([place])   . CHF (congestive heart failure) ([place])   . Coronary artery disease   . Diabetes mellitus without complication ([place])   . Hyperlipidemia   . Medical history non-contributory     Tobacco Use: Social History   Tobacco Use  Smoking Status Former Smoker  . Quit date: 04/08/2019  . Years since quitting: 0.3  Smokeless Tobacco Never Used    Labs: Recent Review Flowsheet Data    Labs for ITP Cardiac and Pulmonary Rehab Latest Ref Rng & Units 04/09/2019 04/09/2019 04/10/2019 04/11/2019 04/12/2019   Cholestrol 0 - 200 mg/dL - - - - -   LDLCALC 0 - 99 mg/dL - - - - -   HDL >40 mg/dL - - - - -   Trlycerides <150 mg/dL - - - - 99   Hemoglobin A1c 4.8 - 5.6 % - - - - -   PHART 7.350 - 7.450 - - - - -   PCO2ART 32.0 - 48.0 mmHg - - - - -   HCO3 20.0 -  28.0 mmol/L - - - - -   TCO2 22 - 32 mmol/L - - - - -   ACIDBASEDEF 0.0 - 2.0 mmol/L - - - - -   O2SAT % 83.8 59.8 61.4 58.4 55.3      Capillary Blood Glucose: Lab Results  Component Value Date   GLUCAP 142 (H) 08/05/2019   GLUCAP 128 (H) 08/02/2019   GLUCAP 94 07/31/2019   GLUCAP 164 (H) 07/31/2019   GLUCAP 121 (H) 07/29/2019     Exercise Target Goals: Exercise Program Goal: Individual exercise prescription set using results from initial 6 min walk test and THRR while considering  patient's activity barriers and safety.   Exercise Prescription Goal: Starting with aerobic activity 30 plus minutes a day, 3 days per week for initial exercise prescription. Provide home exercise prescription and guidelines that participant acknowledges understanding prior to discharge.  Activity  Barriers & Risk Stratification: Activity Barriers & Cardiac Risk Stratification - 07/25/19 1143      Activity Barriers & Cardiac Risk Stratification   Activity Barriers  Deconditioning;Muscular Weakness    Cardiac Risk Stratification  High       6 Minute Walk: 6 Minute Walk    Row Name 07/25/19 1142         6 Minute Walk   Phase  Initial     Distance  1376 feet     Walk Time  6 minutes     # of Rest Breaks  0     MPH  2.6     METS  3.3     RPE  9     Perceived Dyspnea   0     VO2 Peak  11.79     Symptoms  No     Resting HR  60 bpm     Resting BP  122/64     Resting Oxygen Saturation   98 %     Exercise Oxygen Saturation  during 6 min walk  100 %     Max Ex. HR  76 bpm     Max Ex. BP  132/72     2 Minute Post BP  124/68        Oxygen Initial Assessment:   Oxygen Re-Evaluation:   Oxygen Discharge (Final Oxygen Re-Evaluation):   Initial Exercise Prescription: Initial Exercise Prescription - 07/25/19 1100      Date of Initial Exercise RX and Referring Provider   Date  07/25/19    Referring Provider  Dr. Shelva Majestic    Expected Discharge Date  09/20/19      Recumbant Bike   Level  2    Watts  35    Minutes  15    METs  3.38      NuStep   Level  3    SPM  85    Minutes  15    METs  3      Prescription Details   Frequency (times per week)  3    Duration  Progress to 30 minutes of continuous aerobic without signs/symptoms of physical distress      Intensity   THRR 40-80% of Max Heartrate  63-126    Ratings of Perceived Exertion  11-13      Progression   Progression  Continue to progress workloads to maintain intensity without signs/symptoms of physical distress.      Resistance Training   Training Prescription  Yes    Weight  3 lbs.     Reps  10-15       Perform Capillary Blood Glucose checks as needed.  Exercise Prescription Changes: Exercise Prescription Changes    Row Name 07/29/19 1000 08/12/19 0915 08/21/19 0915         Response  to Exercise   Blood Pressure (Admit)  130/78  108/62  138/66     Blood Pressure (Exercise)  132/60  128/72  148/70     Blood Pressure (Exit)  130/72  128/72  112/60     Heart Rate (Admit)  64 bpm  62 bpm  66 bpm     Heart Rate (Exercise)  97 bpm  111 bpm  88 bpm     Heart Rate (Exit)  69 bpm  62 bpm  63 bpm     Rating of Perceived Exertion (Exercise)  11  11  12      Symptoms  None  None  None     Comments  Pt first day of exercise.   --  --     Duration  Continue with 30 min of aerobic exercise without signs/symptoms of physical distress.  Continue with 30 min of aerobic exercise without signs/symptoms of physical distress.  Continue with 30 min of aerobic exercise without signs/symptoms of physical distress.     Intensity  THRR unchanged  THRR unchanged  THRR unchanged       Progression   Progression  Continue to progress workloads to maintain intensity without signs/symptoms of physical distress.  Continue to progress workloads to maintain intensity without signs/symptoms of physical distress.  Continue to progress workloads to maintain intensity without signs/symptoms of physical distress.     Average METs  2.1  2.6  3.1       Resistance Training   Training Prescription  Yes  Yes  No     Weight  3 lbs.   3 lbs.   --     Reps  10-15  10-15  --     Time  10 Minutes  10 Minutes  --       Interval Training   Interval Training  --  No  No       Recumbant Bike   Level  2  2  3      Watts  35  35  --     Minutes  15  15  15      METs  1.9  2.3  2.9       NuStep   Level  3  3  4      SPM  85  85  85     Minutes  15  15  15      METs  2.4  2.9  3.4        Exercise Comments: Exercise Comments    Row Name 07/29/19 1027 08/22/19 1337         Exercise Comments  Pt first day of exercise. Pt tolerated exercise Rx well.  Pt is progressing well in the program. Pt declined virtual rehab.         Exercise Goals and Review: Exercise Goals    Row Name 07/25/19 1153              Exercise Goals   Increase Physical Activity  Yes       Intervention  Provide advice, education, support and counseling about physical activity/exercise needs.;Develop an individualized exercise prescription for aerobic and resistive training based on initial evaluation findings, risk stratification, comorbidities and participant's personal goals.  Expected Outcomes  Short Term: Attend rehab on a regular basis to increase amount of physical activity.;Long Term: Add in home exercise to make exercise part of routine and to increase amount of physical activity.;Long Term: Exercising regularly at least 3-5 days a week.       Increase Strength and Stamina  Yes       Intervention  Provide advice, education, support and counseling about physical activity/exercise needs.;Develop an individualized exercise prescription for aerobic and resistive training based on initial evaluation findings, risk stratification, comorbidities and participant's personal goals.       Expected Outcomes  Short Term: Increase workloads from initial exercise prescription for resistance, speed, and METs.;Short Term: Perform resistance training exercises routinely during rehab and add in resistance training at home;Long Term: Improve cardiorespiratory fitness, muscular endurance and strength as measured by increased METs and functional capacity (6MWT)       Able to understand and use rate of perceived exertion (RPE) scale  Yes       Intervention  Provide education and explanation on how to use RPE scale       Expected Outcomes  Short Term: Able to use RPE daily in rehab to express subjective intensity level;Long Term:  Able to use RPE to guide intensity level when exercising independently       Knowledge and understanding of Target Heart Rate Range (THRR)  Yes       Intervention  Provide education and explanation of THRR including how the numbers were predicted and where they are located for reference       Expected Outcomes  Short  Term: Able to state/look up THRR;Long Term: Able to use THRR to govern intensity when exercising independently;Short Term: Able to use daily as guideline for intensity in rehab       Able to check pulse independently  Yes       Intervention  Provide education and demonstration on how to check pulse in carotid and radial arteries.;Review the importance of being able to check your own pulse for safety during independent exercise       Expected Outcomes  Short Term: Able to explain why pulse checking is important during independent exercise;Long Term: Able to check pulse independently and accurately       Understanding of Exercise Prescription  Yes       Intervention  Provide education, explanation, and written materials on patient's individual exercise prescription       Expected Outcomes  Short Term: Able to explain program exercise prescription;Long Term: Able to explain home exercise prescription to exercise independently          Exercise Goals Re-Evaluation : Exercise Goals Re-Evaluation    Row Name 07/29/19 1025 08/22/19 1334           Exercise Goal Re-Evaluation   Exercise Goals Review  Increase Physical Activity;Increase Strength and Stamina;Able to understand and use rate of perceived exertion (RPE) scale;Knowledge and understanding of Target Heart Rate Range (THRR);Understanding of Exercise Prescription  Increase Physical Activity;Increase Strength and Stamina;Able to understand and use rate of perceived exertion (RPE) scale;Knowledge and understanding of Target Heart Rate Range (THRR);Able to check pulse independently;Understanding of Exercise Prescription      Comments  Pt first day of exercsie in CR program. Pt tolerated exercise Rx well. Pt understands THRR, RPE scale, and exercise Rx.  Pt is progressing well in the program and has a MET level of 3.1. Pt continues to increase workloads and MET level gradually. Pt declined  virutal CR program due to closure of department for [place].       Expected Outcomes  Will continue to monitor and progress Pt as tolerated.  Pt will continue to exercise at home.          Discharge Exercise Prescription (Final Exercise Prescription Changes): Exercise Prescription Changes - 08/21/19 0915      Response to Exercise   Blood Pressure (Admit)  138/66    Blood Pressure (Exercise)  148/70    Blood Pressure (Exit)  112/60    Heart Rate (Admit)  66 bpm    Heart Rate (Exercise)  88 bpm    Heart Rate (Exit)  63 bpm    Rating of Perceived Exertion (Exercise)  12    Symptoms  None    Duration  Continue with 30 min of aerobic exercise without signs/symptoms of physical distress.    Intensity  THRR unchanged      Progression   Progression  Continue to progress workloads to maintain intensity without signs/symptoms of physical distress.    Average METs  3.1      Resistance Training   Training Prescription  No      Interval Training   Interval Training  No      Recumbant Bike   Level  3    Minutes  15    METs  2.9      NuStep   Level  4    SPM  85    Minutes  15    METs  3.4       Nutrition:  Target Goals: Understanding of nutrition guidelines, daily intake of sodium <1554m, cholesterol <2075m calories 30% from fat and 7% or less from saturated fats, daily to have 5 or more servings of fruits and vegetables.  Biometrics: Pre Biometrics - 07/25/19 1153      Pre Biometrics   Height  5' 10.75" (1.797 m)    Weight  82.7 kg    Waist Circumference  38 inches    Hip Circumference  39.5 inches    Waist to Hip Ratio  0.96 %    BMI (Calculated)  25.61    Triceps Skinfold  18 mm    % Body Fat  26.2 %    Grip Strength  37 kg    Flexibility  13.25 in    Single Leg Stand  14.12 seconds        Nutrition Therapy Plan and Nutrition Goals: Nutrition Therapy & Goals - 08/02/19 1040      Nutrition Therapy   Diet  Heart Healthy/Carbohydrate modified    Drug/Food Interactions  Statins/Certain Fruits      Personal Nutrition  Goals   Nutrition Goal  Pt able to name foods that affect blood glucose    Personal Goal #2  Pt to build a healthy plate including vegetables, fruits, whole grains, and low-fat dairy products in a heart healthy meal plan.    Personal Goal #3  CBG concentrations in the normal range or as close to normal as is safely possible      Intervention Plan   Intervention  Nutrition handout(s) given to patient.;Prescribe, educate and counsel regarding individualized specific dietary modifications aiming towards targeted core components such as weight, hypertension, lipid management, diabetes, heart failure and other comorbidities.    Expected Outcomes  Short Term Goal: Understand basic principles of dietary content, such as calories, fat, sodium, cholesterol and nutrients.;Short Term Goal: A plan has been developed with personal nutrition goals  set during dietitian appointment.;Long Term Goal: Adherence to prescribed nutrition plan.       Nutrition Assessments: Nutrition Assessments - 08/02/19 1040      MEDFICTS Scores   Pre Score  103       Nutrition Goals Re-Evaluation: Nutrition Goals Re-Evaluation    [place] Name 08/02/19 1044             Goals   Current Weight  182 lb (82.6 kg)       Nutrition Goal  Pt able to name foods that affect blood glucose         Personal Goal #2 Re-Evaluation   Personal Goal #2  Pt to build a healthy plate including vegetables, fruits, whole grains, and low-fat dairy products in a heart healthy meal plan.         Personal Goal #3 Re-Evaluation   Personal Goal #3  CBG concentrations in the normal range or as close to normal as is safely possible          Nutrition Goals Discharge (Final Nutrition Goals Re-Evaluation): Nutrition Goals Re-Evaluation - 08/02/19 1044      Goals   Current Weight  182 lb (82.6 kg)    Nutrition Goal  Pt able to name foods that affect blood glucose      Personal Goal #2 Re-Evaluation   Personal Goal #2  Pt to build a healthy  plate including vegetables, fruits, whole grains, and low-fat dairy products in a heart healthy meal plan.      Personal Goal #3 Re-Evaluation   Personal Goal #3  CBG concentrations in the normal range or as close to normal as is safely possible       Psychosocial: Target Goals: Acknowledge presence or absence of significant depression and/or stress, maximize coping skills, provide positive support system. Participant is able to verbalize types and ability to use techniques and skills needed for reducing stress and depression.  Initial Review & Psychosocial Screening: Initial Psych Review & Screening - 07/25/19 1003      Initial Review   Current issues with  Current Abuse or Neglect to Report;Current Stress Concerns    Source of Stress Concerns  Chronic Illness;Occupation;Financial    Comments  Raynard has stress related to his CV event and lack of occupation.      Family Dynamics   Good Support System?  Yes   Pt states his wife is a source of support.     Barriers   Psychosocial barriers to participate in program  The patient should benefit from training in stress management and relaxation.      Screening Interventions   Interventions  Encouraged to exercise       Quality of Life Scores: Quality of Life - 07/25/19 0958      Quality of Life   Select  Quality of Life      Quality of Life Scores   Health/Function Pre  24.63 %    Socioeconomic Pre  17.81 %    Psych/Spiritual Pre  29.14 %    Family Pre  27.6 %    GLOBAL Pre  24.4 %      Scores of 19 and below usually indicate a poorer quality of life in these areas.  A difference of  2-3 points is a clinically meaningful difference.  A difference of 2-3 points in the total score of the Quality of Life Index has been associated with significant improvement in overall quality of life, self-image, physical symptoms, and general  health in studies assessing change in quality of life.  PHQ-9: Recent Review Flowsheet Data     Depression screen [facility] 2/9 07/25/2019 05/08/2019   Decreased Interest 0 0   Down, Depressed, Hopeless 0 0   PHQ - 2 Score 0 0   Altered sleeping - 0   Tired, decreased energy - 0   Change in appetite - 0   Feeling bad or failure about yourself  - 0   Trouble concentrating - 0   Moving slowly or fidgety/restless - 0   Suicidal thoughts - 0   PHQ-9 Score - 0     Interpretation of Total Score  Total Score Depression Severity:  1-4 = Minimal depression, 5-9 = Mild depression, 10-14 = Moderate depression, 15-19 = Moderately severe depression, 20-27 = Severe depression   Psychosocial Evaluation and Intervention:   Psychosocial Re-Evaluation: Psychosocial Re-Evaluation    [place] Name 08/01/19 1506 08/15/19 1023           Psychosocial Re-Evaluation   Current issues with  Current Stress Concerns  Current Stress Concerns      Comments  Will continue to  Will continue to monitor. Theador has not voiced any increased stressors.      Interventions  Encouraged to attend Cardiac Rehabilitation for the exercise;Stress management education  Encouraged to attend Cardiac Rehabilitation for the exercise;Stress management education      Continue Psychosocial Services   Follow up required by staff  Follow up required by staff      Comments  Duel has stress related to his CV event and lack of occupation.  Emigdio has stress related to his CV event and lack of occupation.        Initial Review   Source of Stress Concerns  Financial  Financial         Psychosocial Discharge (Final Psychosocial Re-Evaluation): Psychosocial Re-Evaluation - 08/15/19 1023      Psychosocial Re-Evaluation   Current issues with  Current Stress Concerns    Comments  Will continue to monitor. Alhaji has not voiced any increased stressors.    Interventions  Encouraged to attend Cardiac Rehabilitation for the exercise;Stress management education    Continue Psychosocial Services   Follow up required by staff    Comments  Rielly has  stress related to his CV event and lack of occupation.      Initial Review   Source of Stress Concerns  Financial       Vocational Rehabilitation: Provide vocational rehab assistance to qualifying candidates.   Vocational Rehab Evaluation & Intervention: Vocational Rehab - 07/25/19 1239      Initial Vocational Rehab Evaluation & Intervention   Assessment shows need for Vocational Rehabilitation  --   Offered vocational rehab packet to the patient. Declined at this time may ask later      Education: Education Goals: Education classes will be provided on a weekly basis, covering required topics. Participant will state understanding/return demonstration of topics presented.  Learning Barriers/Preferences: Learning Barriers/Preferences - 07/25/19 1154      Learning Barriers/Preferences   Learning Barriers  Sight    Learning Preferences  Video;Skilled Demonstration;Verbal Instruction;Audio;Pictoral       Education Topics: Hypertension, Hypertension Reduction -Define heart disease and high blood pressure. Discus how high blood pressure affects the body and ways to reduce high blood pressure.   Exercise and Your Heart -Discuss why it is important to exercise, the FITT principles of exercise, normal and abnormal responses to exercise, and how to exercise  safely.   Angina -Discuss definition of angina, causes of angina, treatment of angina, and how to decrease risk of having angina.   Cardiac Medications -Review what the following cardiac medications are used for, how they affect the body, and side effects that may occur when taking the medications.  Medications include Aspirin, Beta blockers, calcium channel blockers, ACE Inhibitors, angiotensin receptor blockers, diuretics, digoxin, and antihyperlipidemics.   Congestive Heart Failure -Discuss the definition of CHF, how to live with CHF, the signs and symptoms of CHF, and how keep track of weight and sodium intake.   Heart  Disease and Intimacy -Discus the effect sexual activity has on the heart, how changes occur during intimacy as we age, and safety during sexual activity.   Smoking Cessation / COPD -Discuss different methods to quit smoking, the health benefits of quitting smoking, and the definition of COPD.   Nutrition I: Fats -Discuss the types of cholesterol, what cholesterol does to the heart, and how cholesterol levels can be controlled.   Nutrition II: Labels -Discuss the different components of food labels and how to read food label   Heart Parts/Heart Disease and PAD -Discuss the anatomy of the heart, the pathway of blood circulation through the heart, and these are affected by heart disease.   Stress I: Signs and Symptoms -Discuss the causes of stress, how stress may lead to anxiety and depression, and ways to limit stress.   Stress II: Relaxation -Discuss different types of relaxation techniques to limit stress.   Warning Signs of Stroke / TIA -Discuss definition of a stroke, what the signs and symptoms are of a stroke, and how to identify when someone is having stroke.   Knowledge Questionnaire Score: Knowledge Questionnaire Score - 07/25/19 1012      Knowledge Questionnaire Score   Pre Score  21/28       Core Components/Risk Factors/Patient Goals at Admission: Personal Goals and Risk Factors at Admission - 07/25/19 1241      Core Components/Risk Factors/Patient Goals on Admission    Weight Management  Yes;Weight Maintenance    Intervention  Weight Management: Develop a combined nutrition and exercise program designed to reach desired caloric intake, while maintaining appropriate intake of nutrient and fiber, sodium and fats, and appropriate energy expenditure required for the weight goal.;Weight Management: Provide education and appropriate resources to help participant work on and attain dietary goals.    Admit Weight  182 lb 5.1 oz (82.7 kg)    Expected Outcomes  Short  Term: Continue to assess and modify interventions until short term weight is achieved;Long Term: Adherence to nutrition and physical activity/exercise program aimed toward attainment of established weight goal;Weight Maintenance: Understanding of the daily nutrition guidelines, which includes 25-35% calories from fat, 7% or less cal from saturated fats, less than 235m cholesterol, less than 1.5gm of sodium, & 5 or more servings of fruits and vegetables daily;Understanding recommendations for meals to include 15-35% energy as protein, 25-35% energy from fat, 35-60% energy from carbohydrates, less than 2091mof dietary cholesterol, 20-35 gm of total fiber daily;Understanding of distribution of calorie intake throughout the day with the consumption of 4-5 meals/snacks    Tobacco Cessation  Yes   Patient quit on 04/08/19   Intervention  Offer self-teaching materials, assist with locating and accessing local/national Quit Smoking programs, and support quit date choice.    Expected Outcomes  Long Term: Complete abstinence from all tobacco products for at least 12 months from quit date.    Diabetes  Yes    Intervention  Provide education about signs/symptoms and action to take for hypo/hyperglycemia.;Provide education about proper nutrition, including hydration, and aerobic/resistive exercise prescription along with prescribed medications to achieve blood glucose in normal ranges: Fasting glucose 65-99 mg/dL    Expected Outcomes  Short Term: Participant verbalizes understanding of the signs/symptoms and immediate care of hyper/hypoglycemia, proper foot care and importance of medication, aerobic/resistive exercise and nutrition plan for blood glucose control.;Long Term: Attainment of HbA1C < 7%.    Heart Failure  Yes    Intervention  --   Patient will monitor weight, follow a low sodium heart healthy diet and exercise at cardiac rehab and at home   Expected Outcomes  Short term: Attendance in program 2-3 days a  week with increased exercise capacity. Reported lower sodium intake. Reported increased fruit and vegetable intake. Reports medication compliance.;Short term: Daily weights obtained and reported for increase. Utilizing diuretic protocols set by physician.;Long term: Adoption of self-care skills and reduction of barriers for early signs and symptoms recognition and intervention leading to self-care maintenance.    Lipids  Yes    Intervention  Provide education and support for participant on nutrition & aerobic/resistive exercise along with prescribed medications to achieve LDL <82m, HDL >431m    Expected Outcomes  Short Term: Participant states understanding of desired cholesterol values and is compliant with medications prescribed. Participant is following exercise prescription and nutrition guidelines.;Long Term: Cholesterol controlled with medications as prescribed, with individualized exercise RX and with personalized nutrition plan. Value goals: LDL < 7061mHDL > 40 mg.    Stress  Yes    Intervention  Offer individual and/or small group education and counseling on adjustment to heart disease, stress management and health-related lifestyle change. Teach and support self-help strategies.;Refer participants experiencing significant psychosocial distress to appropriate mental health specialists for further evaluation and treatment. When possible, include family members and significant others in education/counseling sessions.    Expected Outcomes  Short Term: Participant demonstrates changes in health-related behavior, relaxation and other stress management skills, ability to obtain effective social support, and compliance with psychotropic medications if prescribed.;Long Term: Emotional wellbeing is indicated by absence of clinically significant psychosocial distress or social isolation.       Core Components/Risk Factors/Patient Goals Review:  Goals and Risk Factor Review    Row Name 07/30/19 1435  07/30/19 1436 08/15/19 1024 08/26/19 1128       Core Components/Risk Factors/Patient Goals Review   Personal Goals Review  Weight Management/Obesity;Lipids;Tobacco Cessation;Diabetes;Hypertension;Stress  Weight Management/Obesity;Lipids;Tobacco Cessation;Diabetes;Hypertension;Stress  Weight Management/Obesity;Lipids;Tobacco Cessation;Diabetes;Hypertension;Stress  Weight Management/Obesity;Lipids;Tobacco Cessation;Diabetes;Hypertension;Stress    Review  --  BarJonericarted exercise on 07/29/19 and exercised without difficulty.  BarRauns done well with exercise. Alejos's vital signs have been stable.  BarZackorys done well with exercise. Arrow's vital signs have been stable.    Expected Outcomes  --  BarJabarll continue to particpate in phase 2 cardiac rehab for exercise, nutrition and lifestyle modifications.  BarYerielll continue to particpate in phase 2 cardiac rehab for exercise, nutrition and lifestyle modifications.  Cardiac rehab is currently on hold due to [place] pandemic. BarJadd not interested in virtual cardiac rehab at this time but would like to resume phase 2 cardiac rehab when the program reopens.       Core Components/Risk Factors/Patient Goals at Discharge (Final Review):  Goals and Risk Factor Review - 08/26/19 1128      Core Components/Risk Factors/Patient Goals Review   Personal Goals Review  Weight  Management/Obesity;Lipids;Tobacco Cessation;Diabetes;Hypertension;Stress    Review  Navin has done well with exercise. Moxon's vital signs have been stable.    Expected Outcomes  Cardiac rehab is currently on hold due to [place] 19 pandemic. Sederick is not interested in virtual cardiac rehab at this time but would like to resume phase 2 cardiac rehab when the program reopens.       ITP Comments: ITP Comments    Row Name 07/25/19 1223 07/30/19 1433 08/15/19 1021       ITP Comments  Dr Fransico Him MD, Medical Director  30 Day ITP Review. Patient started exercise on 07/29/19 and exercised  without difficulty.  30 Day ITP Review. Patient is with good participation and attendance in phase 2 cardiac rehab. Andre's CBG's have been stable.        Comments: See ITP comments. Phase 2 cardiac rehab is currently on hold due to the [place] 19 pandemic. Jamiere would like to resume phase 2 cardiac rehab when the program reopens.Barnet Pall, RN,BSN 08/26/2019 11:37 AM

## 2019-08-26 NOTE — Telephone Encounter (Signed)
Spoke with the patient he would like to resume in person  Phase 2 cardiac rehab when the program re opens. Jusiah says he is remaining active and riding his bike at home.Barnet Pall, RN,BSN 08/26/2019 11:27 AM

## 2019-08-28 ENCOUNTER — Encounter (HOSPITAL_COMMUNITY): Payer: Medicaid Other

## 2019-08-29 LAB — CUP PACEART INCLINIC DEVICE CHECK
Date Time Interrogation Session: 20201124105143
Implantable Lead Implant Date: 20200824
Implantable Lead Implant Date: 20200824
Implantable Lead Location: 753859
Implantable Lead Location: 753860
Implantable Pulse Generator Implant Date: 20200824
Pulse Gen Serial Number: 1300954

## 2019-08-30 ENCOUNTER — Encounter (HOSPITAL_COMMUNITY): Payer: Medicaid Other

## 2019-09-02 ENCOUNTER — Encounter (HOSPITAL_COMMUNITY): Payer: Medicaid Other

## 2019-09-02 MED FILL — ASPIRIN CHILD 81 MG TAB CHE: 81 | 30 days supply | Qty: 30 | Fill #1

## 2019-09-02 MED FILL — DIGOXIN 0.125 MG TABLET: 125 | 30 days supply | Qty: 30 | Fill #1

## 2019-09-02 MED FILL — CARVEDILOL 3.125 MG TABLET: 3.125 | 30 days supply | Qty: 60 | Fill #1

## 2019-09-02 MED FILL — ATORVASTATIN 80 MG TABLET: 80 | 30 days supply | Qty: 30 | Fill #1

## 2019-09-03 MED FILL — EPLERENONE 25 MG TABLET: 25 | 30 days supply | Qty: 30 | Fill #1

## 2019-09-04 ENCOUNTER — Encounter (HOSPITAL_COMMUNITY): Payer: Medicaid Other

## 2019-09-06 ENCOUNTER — Encounter (HOSPITAL_COMMUNITY): Payer: Medicaid Other

## 2019-09-09 ENCOUNTER — Encounter (HOSPITAL_COMMUNITY): Payer: Medicaid Other

## 2019-09-11 ENCOUNTER — Encounter (HOSPITAL_COMMUNITY): Payer: Medicaid Other

## 2019-09-13 ENCOUNTER — Encounter (HOSPITAL_COMMUNITY): Payer: Medicaid Other

## 2019-09-16 ENCOUNTER — Encounter (HOSPITAL_COMMUNITY): Payer: Medicaid Other

## 2019-09-18 ENCOUNTER — Encounter (HOSPITAL_COMMUNITY): Payer: Medicaid Other

## 2019-09-20 ENCOUNTER — Encounter (HOSPITAL_COMMUNITY): Payer: Medicaid Other

## 2019-09-20 ENCOUNTER — Other Ambulatory Visit (HOSPITAL_COMMUNITY): Payer: Self-pay | Admitting: Cardiology

## 2019-09-20 MED FILL — LOSARTAN POTASSIUM 50 MG TA: 50 | 30 days supply | Qty: 30 | Fill #1

## 2019-09-23 ENCOUNTER — Encounter (HOSPITAL_COMMUNITY): Payer: Self-pay | Admitting: Internal Medicine

## 2019-09-23 ENCOUNTER — Ambulatory Visit (HOSPITAL_COMMUNITY)
Admission: RE | Admit: 2019-09-23 | Discharge: 2019-09-23 | Disposition: A | Discharge status: Home / Self Care | Payer: Medicaid Other | Source: Ambulatory Visit | Attending: Internal Medicine | Admitting: Internal Medicine

## 2019-09-23 ENCOUNTER — Ambulatory Visit (HOSPITAL_BASED_OUTPATIENT_CLINIC_OR_DEPARTMENT_OTHER)
Admission: RE | Admit: 2019-09-23 | Discharge: 2019-09-23 | Disposition: A | Discharge status: Home / Self Care | Payer: Medicaid Other | Source: Ambulatory Visit | Attending: Internal Medicine | Admitting: Internal Medicine

## 2019-09-23 ENCOUNTER — Other Ambulatory Visit: Payer: Self-pay

## 2019-09-23 VITALS — BP 110/62 | HR 57 | Wt 191.6 lb

## 2019-09-23 DIAGNOSIS — I5022 Chronic systolic (congestive) heart failure: Secondary | ICD-10-CM | POA: Diagnosis not present

## 2019-09-23 DIAGNOSIS — I2510 Atherosclerotic heart disease of native coronary artery without angina pectoris: Secondary | ICD-10-CM | POA: Diagnosis not present

## 2019-09-23 DIAGNOSIS — R9431 Abnormal electrocardiogram [ECG] [EKG]: Secondary | ICD-10-CM | POA: Diagnosis not present

## 2019-09-23 DIAGNOSIS — Z8674 Personal history of sudden cardiac arrest: Secondary | ICD-10-CM | POA: Diagnosis not present

## 2019-09-23 DIAGNOSIS — Z95810 Presence of automatic (implantable) cardiac defibrillator: Secondary | ICD-10-CM | POA: Insufficient documentation

## 2019-09-23 DIAGNOSIS — Z955 Presence of coronary angioplasty implant and graft: Secondary | ICD-10-CM | POA: Diagnosis not present

## 2019-09-23 DIAGNOSIS — I081 Rheumatic disorders of both mitral and tricuspid valves: Secondary | ICD-10-CM | POA: Diagnosis not present

## 2019-09-23 DIAGNOSIS — Z79899 Other long term (current) drug therapy: Secondary | ICD-10-CM | POA: Diagnosis not present

## 2019-09-23 DIAGNOSIS — E785 Hyperlipidemia, unspecified: Secondary | ICD-10-CM | POA: Insufficient documentation

## 2019-09-23 DIAGNOSIS — I255 Ischemic cardiomyopathy: Secondary | ICD-10-CM

## 2019-09-23 DIAGNOSIS — J449 Chronic obstructive pulmonary disease, unspecified: Secondary | ICD-10-CM | POA: Diagnosis not present

## 2019-09-23 DIAGNOSIS — Z87891 Personal history of nicotine dependence: Secondary | ICD-10-CM | POA: Diagnosis not present

## 2019-09-23 DIAGNOSIS — Z7982 Long term (current) use of aspirin: Secondary | ICD-10-CM | POA: Diagnosis not present

## 2019-09-23 DIAGNOSIS — I5021 Acute systolic (congestive) heart failure: Secondary | ICD-10-CM | POA: Insufficient documentation

## 2019-09-23 DIAGNOSIS — Z8249 Family history of ischemic heart disease and other diseases of the circulatory system: Secondary | ICD-10-CM | POA: Diagnosis not present

## 2019-09-23 DIAGNOSIS — E119 Type 2 diabetes mellitus without complications: Secondary | ICD-10-CM | POA: Insufficient documentation

## 2019-09-23 DIAGNOSIS — I509 Heart failure, unspecified: Secondary | ICD-10-CM | POA: Diagnosis present

## 2019-09-23 LAB — COMPREHENSIVE METABOLIC PANEL WITH GFR
ALT: 42 U/L (ref 0–44)
AST: 30 U/L (ref 15–41)
Albumin: 3.9 g/dL (ref 3.5–5.0)
Alkaline Phosphatase: 75 U/L (ref 38–126)
Anion gap: 8 (ref 5–15)
BUN: 12 mg/dL (ref 8–23)
CO2: 27 mmol/L (ref 22–32)
Calcium: 9.2 mg/dL (ref 8.9–10.3)
Chloride: 106 mmol/L (ref 98–111)
Creatinine, Ser: 0.96 mg/dL (ref 0.61–1.24)
GFR calc Af Amer: >60 mL/min
GFR calc non Af Amer: >60 mL/min
Glucose, Bld: 117 mg/dL — ABNORMAL HIGH (ref 70–99)
Potassium: 4.5 mmol/L (ref 3.5–5.1)
Sodium: 141 mmol/L (ref 135–145)
Total Bilirubin: 0.7 mg/dL (ref 0.3–1.2)
Total Protein: 6.6 g/dL (ref 6.5–8.1)

## 2019-09-23 LAB — LIPID PANEL
Cholesterol: 113 mg/dL (ref 0–200)
HDL: 35 mg/dL — ABNORMAL LOW (ref >40)
LDL Cholesterol: 62 mg/dL (ref 0–99)
Total CHOL/HDL Ratio: 3.2 RATIO
Triglycerides: 79 mg/dL (ref <150)
VLDL: 16 mg/dL (ref 0–40)

## 2019-09-23 LAB — DIGOXIN LEVEL: Digoxin Level: 0.7 ng/mL — ABNORMAL LOW (ref 0.8–2.0)

## 2019-09-23 LAB — BRAIN NATRIURETIC PEPTIDE: B Natriuretic Peptide: 170.9 pg/mL — ABNORMAL HIGH (ref 0.0–100.0)

## 2019-09-23 MED ORDER — JARDIANCE 10 MG PO TABS: 10.0000 mg | ORAL_TABLET | Freq: Every day | ORAL | 11 refills | Status: DC

## 2019-09-23 NOTE — Patient Instructions (Signed)
STOP Digoxin START Jardiance 10 mg, one tab daily   Labs today We will only contact you if something comes back abnormal or we need to make some changes. Otherwise no news is good news!  Your physician recommends that you schedule a follow-up appointment in: 6 months with Dr Haroldine Laws (please give our office a call in Aug 2021 to schedule)   Do the following things EVERYDAY: 1) Weigh yourself in the morning before breakfast. Write it down and keep it in a log. 2) Take your medicines as prescribed 3) Eat low salt foods--Limit salt (sodium) to 2000 mg per day.  4) Stay as active as you can everyday 5) Limit all fluids for the day to less than 2 liters  At the [facility], you and your health needs are our priority. As part of our continuing mission to provide you with exceptional heart care, we have created designated Provider Care Teams. These Care Teams include your primary Cardiologist (physician) and Advanced Practice Providers (APPs- Physician Assistants and Nurse Practitioners) who all work together to provide you with the care you need, when you need it.   You may see any of the following providers on your designated Care Team at your next follow up: Marland Kitchen Dr [name] . Dr Loralie Champagne . Darrick Grinder, NP . Lyda Jester, PA . Audry Riles, PharmD   Please be sure to bring in all your medications bottles to every appointment.

## 2019-09-23 NOTE — Progress Notes (Signed)
  Echocardiogram 2D Echocardiogram has been performed.  Jennette Dubin 09/23/2019, 10:41 AM

## 2019-09-23 NOTE — Progress Notes (Signed)
Advanced Heart Failure Clinic Note   Referring Physician: PCP: Antony Blackbird, MD PCP-Cardiologist: No primary care provider on file.   HPI:  Stephen Jennings is a 64 y/o male with h/o COPD, DM2,  No known cardiac history. Brought to ER for on 03/31/19 with respiratory failure/cardiac arrest. Intubated in ER. Initial ECG with anterior ST elevation. Intubated and transported to cath lab emergently. Cath by Dr. Claiborne Billings with 3v CAD and totally occluded prox LAD. LCX anomalous off RCA. Underwent PCI of LAD which was diffusely diseased vessel. Developed cardiogenic shock requiring pressors and mechanical support with impella.  03/31/2019 Echo 20-25%. Hospital course complicated by Torsades. EP consulted placed ICD prior to discharge.    Here for f/u. Says he is doing great. Doing all activities without problem. No CP or SOB. Has not smoked a cigarette since his MI. Did not tolerate Entresto so switched back to losartan 25. Titrated to 50 but said that made his ulcer bleed so now back on 25. No edema, orthopnea or PND. No ICD shocks.  Echo today EF 45-50% Personally reviewed    Past Medical History:  Diagnosis Date  . Cardiogenic shock ([place])   . CHF (congestive heart failure) ([employer])   . Coronary artery disease   . Diabetes mellitus without complication ([place])   . Hyperlipidemia   . Medical history non-contributory     Current Outpatient Medications  Medication Sig Dispense Refill  . aspirin 81 MG chewable tablet CHEW 1 TABLET BY MOUTH DAILY. 30 tablet 2  . atorvastatin (LIPITOR) 80 MG tablet TAKE 1 TABLET BY MOUTH DAILY AT 6 PM. 30 tablet 2  . Blood Glucose Monitoring Suppl (TRUE METRIX METER) w/Device KIT Use to measure blood sugar twice a day 1 kit 0  . carvedilol (COREG) 3.125 MG tablet TAKE 1 TABLET BY MOUTH 2 TIMES DAILY WITH A MEAL. 60 tablet 2  . digoxin (LANOXIN) 0.125 MG tablet TAKE 1 TABLET BY MOUTH DAILY. 30 tablet 2  . eplerenone (INSPRA) 25 MG tablet Take 1 tablet (25 mg total) by mouth  daily. 30 tablet 11  . glucose blood (TRUE METRIX BLOOD GLUCOSE TEST) test strip Use as instructed 100 each 12  . Hyprom-Naphaz-Polysorb-Zn Sulf (CLEAR EYES COMPLETE) SOLN Apply 1 drop to eye 3 (three) times daily. 15 mL 1  . losartan (COZAAR) 50 MG tablet Take 25 mg by mouth daily.    . nitroGLYCERIN (NITROSTAT) 0.4 MG SL tablet Place 1 tablet (0.4 mg total) under the tongue every 5 (five) minutes x 3 doses as needed for chest pain. 25 tablet 12  . ticagrelor (BRILINTA) 90 MG TABS tablet Take 1 tablet (90 mg total) by mouth 2 (two) times daily. 60 tablet 2  . TRUEplus Lancets 28G MISC Use to measure blood sugar twice a day 100 each 1   No current facility-administered medications for this encounter.    Allergies  Allergen Reactions  . Spironolactone     gynecomastia  . Metformin And Related Anxiety and Palpitations      Social History   Socioeconomic History  . Marital status: Married    Spouse name: Not on file  . Number of children: Not on file  . Years of education: 8  . Highest education level: 8th grade  Occupational History  . Not on file  Tobacco Use  . Smoking status: Former Smoker    Quit date: 04/08/2019    Years since quitting: 0.4  . Smokeless tobacco: Never Used  Substance and Sexual Activity  .  Alcohol use: No  . Drug use: No  . Sexual activity: Not on file  Other Topics Concern  . Not on file  Social History Narrative  . Not on file   Social Determinants of Health   Financial Resource Strain: High Risk  . Difficulty of Paying Living Expenses: Hard  Food Insecurity:   . Worried About Charity fundraiser in the Last Year: Not on file  . Ran Out of Food in the Last Year: Not on file  Transportation Needs: No Transportation Needs  . Lack of Transportation (Medical): No  . Lack of Transportation (Non-Medical): No  Physical Activity: Insufficiently Active  . Days of Exercise per Week: 4 days  . Minutes of Exercise per Session: 30 min  Stress: Stress  Concern Present  . Feeling of Stress : Rather much  Social Connections:   . Frequency of Communication with Friends and Family: Not on file  . Frequency of Social Gatherings with Friends and Family: Not on file  . Attends Religious Services: Not on file  . Active Member of Clubs or Organizations: Not on file  . Attends Archivist Meetings: Not on file  . Marital Status: Not on file  Intimate Partner Violence:   . Fear of Current or Ex-Partner: Not on file  . Emotionally Abused: Not on file  . Physically Abused: Not on file  . Sexually Abused: Not on file      Family History  Problem Relation Age of Onset  . Hypertension Mother   . Heart failure Mother     Vitals:   09/23/19 1059  BP: 110/62  Pulse: (!) 57  SpO2: 96%  Weight: 86.9 kg (191 lb 9.6 oz)     PHYSICAL EXAM: General:  Well appearing. No resp difficulty HEENT: normal Neck: supple. no JVD. Carotids 2+ bilat; no bruits. No lymphadenopathy or thryomegaly appreciated. Cor: PMI nondisplaced. Regular rate & rhythm. No rubs, gallops or murmurs. Lungs: clear with decreased breath sounds throughout  Abdomen: soft, nontender, nondistended. No hepatosplenomegaly. No bruits or masses. Good bowel sounds. Extremities: no cyanosis, clubbing, rash, edema Neuro: alert & orientedx3, cranial nerves grossly intact. moves all 4 extremities w/o difficulty. Affect pleasant  ECG: A-paced 50 septal q-waves. Personally reviewed    ASSESSMENT & PLAN:  1. Cardiac arrest/VT/torsades on 8/15 - Now s/p ICD - ICD interrogated personally in clinic. No VT. 4% A pacing   2. Acute systolic HF -> Cardiogenic shock due to acute anterior MI - s/p PCI/DES to LAD on 8/9 - -03/31/2019 Echo 20-25% - echo today 07/22/20: EF 45-50% - NYHA II. Volume status ok - Stop digoxin  - Continue spiro to 25 mg daily.  - He did not tolerate Entresto due to low BP in hospital - Continue losartan 25 daily . - Continue low dose carvedilol. HR low  and requiring a-pacing. May improve with stopping dig - Labs today - Start Jardiance  3. CAD with acute anterior MI 8/9 - s/p PCI/DES to LAD. 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 On Asa+ brillinta. - No s/s ischemia - Start jardiance  4. DM2 - On metformin. Add Jardiance 10   5. COPD - congratulated on smoking cessation    Stephen Bickers, MD 09/23/19

## 2019-09-25 ENCOUNTER — Encounter (HOSPITAL_COMMUNITY): Payer: Self-pay | Admitting: *Deleted

## 2019-09-25 DIAGNOSIS — I2102 ST elevation (STEMI) myocardial infarction involving left anterior descending coronary artery: Secondary | ICD-10-CM

## 2019-09-25 NOTE — Addendum Note (Signed)
Encounter addended by: Sol Passer on: 09/25/2019 2:33 PM  Actions taken: Flowsheet data copied forward, Flowsheet accepted

## 2019-09-25 NOTE — Progress Notes (Signed)
Cardiac Individual Treatment Plan  Patient Details  Name: Stephen Jennings MRN: [id number] Date of Birth: [birth year] Referring Provider:     [place] from 07/25/2019 in [facility]  Referring Provider  Dr. Shelva Majestic      Initial Encounter Date:    [place] from 07/25/2019 in [facility]  Date  07/25/19      Visit Diagnosis: ST elevation myocardial infarction involving left anterior descending (LAD) coronary artery ([place]) 03/31/19  Patient's Home Medications on Admission:  Current Outpatient Medications:  .  aspirin 81 MG chewable tablet, CHEW 1 TABLET BY MOUTH DAILY., Disp: 30 tablet, Rfl: 2 .  atorvastatin (LIPITOR) 80 MG tablet, TAKE 1 TABLET BY MOUTH DAILY AT 6 PM., Disp: 30 tablet, Rfl: 2 .  Blood Glucose Monitoring Suppl (TRUE METRIX METER) w/Device KIT, Use to measure blood sugar twice a day, Disp: 1 kit, Rfl: 0 .  carvedilol (COREG) 3.125 MG tablet, TAKE 1 TABLET BY MOUTH 2 TIMES DAILY WITH A MEAL., Disp: 60 tablet, Rfl: 2 .  empagliflozin (JARDIANCE) 10 MG TABS tablet, Take 10 mg by mouth daily before breakfast., Disp: 30 tablet, Rfl: 11 .  eplerenone (INSPRA) 25 MG tablet, Take 1 tablet (25 mg total) by mouth daily., Disp: 30 tablet, Rfl: 11 .  glucose blood (TRUE METRIX BLOOD GLUCOSE TEST) test strip, Use as instructed, Disp: 100 each, Rfl: 12 .  Hyprom-Naphaz-Polysorb-Zn Sulf (CLEAR EYES COMPLETE) SOLN, Apply 1 drop to eye 3 (three) times daily., Disp: 15 mL, Rfl: 1 .  losartan (COZAAR) 50 MG tablet, Take 25 mg by mouth daily., Disp: , Rfl:  .  nitroGLYCERIN (NITROSTAT) 0.4 MG SL tablet, Place 1 tablet (0.4 mg total) under the tongue every 5 (five) minutes x 3 doses as needed for chest pain., Disp: 25 tablet, Rfl: 12 .  ticagrelor (BRILINTA) 90 MG TABS tablet, Take 1 tablet (90 mg total) by mouth 2 (two) times daily., Disp: 60 tablet, Rfl: 2 .  TRUEplus Lancets 28G  MISC, Use to measure blood sugar twice a day, Disp: 100 each, Rfl: 1  Past Medical History: Past Medical History:  Diagnosis Date  . Cardiogenic shock ([place])   . CHF (congestive heart failure) ([place])   . Coronary artery disease   . Diabetes mellitus without complication ([place])   . Hyperlipidemia   . Medical history non-contributory     Tobacco Use: Social History   Tobacco Use  Smoking Status Former Smoker  . Quit date: 04/08/2019  . Years since quitting: 0.4  Smokeless Tobacco Never Used    Labs: Recent Review Flowsheet Data    Labs for ITP Cardiac and Pulmonary Rehab Latest Ref Rng & Units 04/09/2019 04/10/2019 04/11/2019 04/12/2019 09/23/2019   Cholestrol 0 - 200 mg/dL - - - - 113   LDLCALC 0 - 99 mg/dL - - - - 62   HDL >40 mg/dL - - - - 35(L)   Trlycerides <150 mg/dL - - - 99 79   Hemoglobin A1c 4.8 - 5.6 % - - - - -   PHART 7.350 - 7.450 - - - - -   PCO2ART 32.0 - 48.0 mmHg - - - - -   HCO3 20.0 - 28.0 mmol/L - - - - -   TCO2 22 - 32 mmol/L - - - - -   ACIDBASEDEF 0.0 - 2.0 mmol/L - - - - -   O2SAT % 59.8 61.4 58.4  55.3 -      Capillary Blood Glucose: Lab Results  Component Value Date   GLUCAP 142 (H) 08/05/2019   GLUCAP 128 (H) 08/02/2019   GLUCAP 94 07/31/2019   GLUCAP 164 (H) 07/31/2019   GLUCAP 121 (H) 07/29/2019     Exercise Target Goals: Exercise Program Goal: Individual exercise prescription set using results from initial 6 min walk test and THRR while considering  patient's activity barriers and safety.   Exercise Prescription Goal: Starting with aerobic activity 30 plus minutes a day, 3 days per week for initial exercise prescription. Provide home exercise prescription and guidelines that participant acknowledges understanding prior to discharge.  Activity Barriers & Risk Stratification:   6 Minute Walk:   Oxygen Initial Assessment:   Oxygen Re-Evaluation:   Oxygen Discharge (Final Oxygen Re-Evaluation):   Initial Exercise  Prescription:   Perform Capillary Blood Glucose checks as needed.  Exercise Prescription Changes: Exercise Prescription Changes    Row Name 07/29/19 1000 08/12/19 0915 08/21/19 0915         Response to Exercise   Blood Pressure (Admit)  130/78  108/62  138/66     Blood Pressure (Exercise)  132/60  128/72  148/70     Blood Pressure (Exit)  130/72  128/72  112/60     Heart Rate (Admit)  64 bpm  62 bpm  66 bpm     Heart Rate (Exercise)  97 bpm  111 bpm  88 bpm     Heart Rate (Exit)  69 bpm  62 bpm  63 bpm     Rating of Perceived Exertion (Exercise)  _0 Symptoms  None  None  None     Comments  Pt first day of exercise.   --  --     Duration  Continue with 30 min of aerobic exercise without signs/symptoms of physical distress.  Continue with 30 min of aerobic exercise without signs/symptoms of physical distress.  Continue with 30 min of aerobic exercise without signs/symptoms of physical distress.     Intensity  THRR unchanged  THRR unchanged  THRR unchanged       Progression   Progression  Continue to progress workloads to maintain intensity without signs/symptoms of physical distress.  Continue to progress workloads to maintain intensity without signs/symptoms of physical distress.  Continue to progress workloads to maintain intensity without signs/symptoms of physical distress.     Average METs  2.1  2.6  3.1       Resistance Training   Training Prescription  Yes  Yes  No     Weight  3 lbs.   3 lbs.   --     Reps  10-15  10-15  --     Time  10 Minutes  10 Minutes  --       Interval Training   Interval Training  --  No  No       Recumbant Bike   Level  _1 [place]  35  35  --     Minutes  _2 METs  1.9  2.3  2.9       NuStep   Level  _3 SPM  85  85  85     Minutes  _4 METs  2.4  2.9  3.4        Exercise Comments: Exercise Comments    Row Name 07/29/19 1027 08/22/19 1337 09/25/19 1432       Exercise Comments  Pt  first day of exercise. Pt tolerated exercise Rx well.  Pt is progressing well in the program. Pt declined virtual rehab.  Patient will be contacted to resume participation in the onsite cardiac rehab program after temporary closure due to COVID-19 pandemic.        Exercise Goals and Review:   Exercise Goals Re-Evaluation : Exercise Goals Re-Evaluation    Row Name 07/29/19 1025 08/22/19 1334 09/25/19 1432         Exercise Goal Re-Evaluation   Exercise Goals Review  Increase Physical Activity;Increase Strength and Stamina;Able to understand and use rate of perceived exertion (RPE) scale;Knowledge and understanding of Target Heart Rate Range (THRR);Understanding of Exercise Prescription  Increase Physical Activity;Increase Strength and Stamina;Able to understand and use rate of perceived exertion (RPE) scale;Knowledge and understanding of Target Heart Rate Range (THRR);Able to check pulse independently;Understanding of Exercise Prescription  Increase Physical Activity;Increase Strength and Stamina;Able to understand and use rate of perceived exertion (RPE) scale;Knowledge and understanding of Target Heart Rate Range (THRR);Able to check pulse independently;Understanding of Exercise Prescription     Comments  Pt first day of exercsie in CR program. Pt tolerated exercise Rx well. Pt understands THRR, RPE scale, and exercise Rx.  Pt is progressing well in the program and has a MET level of 3.1. Pt continues to increase workloads and MET level gradually. Pt declined virutal CR program due to closure of department for [place].  Patient will be contacted to resume participation in the onsite cardiac rehab program after temporary closure due to COVID-19 pandemic.     Expected Outcomes  Will continue to monitor and progress Pt as tolerated.  Pt will continue to exercise at home.  Patient will resume participation in the onsite cardaic rehab program.         Discharge Exercise Prescription (Final Exercise  Prescription Changes): Exercise Prescription Changes - 08/21/19 0915      Response to Exercise   Blood Pressure (Admit)  138/66    Blood Pressure (Exercise)  148/70    Blood Pressure (Exit)  112/60    Heart Rate (Admit)  66 bpm    Heart Rate (Exercise)  88 bpm    Heart Rate (Exit)  63 bpm    Rating of Perceived Exertion (Exercise)  12    Symptoms  None    Duration  Continue with 30 min of aerobic exercise without signs/symptoms of physical distress.    Intensity  THRR unchanged      Progression   Progression  Continue to progress workloads to maintain intensity without signs/symptoms of physical distress.    Average METs  3.1      Resistance Training   Training Prescription  No      Interval Training   Interval Training  No      Recumbant Bike   Level  3    Minutes  15    METs  2.9      NuStep   Level  4    SPM  85    Minutes  15    METs  3.4       Nutrition:  Target Goals: Understanding of nutrition guidelines, daily intake of sodium <1561m, cholesterol <2040m calories 30% from fat and 7% or less from saturated fats, daily to have 5  or more servings of fruits and vegetables.  Biometrics:    Nutrition Therapy Plan and Nutrition Goals: Nutrition Therapy & Goals - 08/02/19 1040      Nutrition Therapy   Diet  Heart Healthy/Carbohydrate modified    Drug/Food Interactions  Statins/Certain Fruits      Personal Nutrition Goals   Nutrition Goal  Pt able to name foods that affect blood glucose    Personal Goal #2  Pt to build a healthy plate including vegetables, fruits, whole grains, and low-fat dairy products in a heart healthy meal plan.    Personal Goal #3  CBG concentrations in the normal range or as close to normal as is safely possible      Intervention Plan   Intervention  Nutrition handout(s) given to patient.;Prescribe, educate and counsel regarding individualized specific dietary modifications aiming towards targeted core components such as weight,  hypertension, lipid management, diabetes, heart failure and other comorbidities.    Expected Outcomes  Short Term Goal: Understand basic principles of dietary content, such as calories, fat, sodium, cholesterol and nutrients.;Short Term Goal: A plan has been developed with personal nutrition goals set during dietitian appointment.;Long Term Goal: Adherence to prescribed nutrition plan.       Nutrition Assessments: Nutrition Assessments - 08/02/19 1040      MEDFICTS Scores   Pre Score  103       Nutrition Goals Re-Evaluation: Nutrition Goals Re-Evaluation    [place] Name 08/02/19 1044             Goals   Current Weight  182 lb (82.6 kg)       Nutrition Goal  Pt able to name foods that affect blood glucose         Personal Goal #2 Re-Evaluation   Personal Goal #2  Pt to build a healthy plate including vegetables, fruits, whole grains, and low-fat dairy products in a heart healthy meal plan.         Personal Goal #3 Re-Evaluation   Personal Goal #3  CBG concentrations in the normal range or as close to normal as is safely possible          Nutrition Goals Discharge (Final Nutrition Goals Re-Evaluation): Nutrition Goals Re-Evaluation - 08/02/19 1044      Goals   Current Weight  182 lb (82.6 kg)    Nutrition Goal  Pt able to name foods that affect blood glucose      Personal Goal #2 Re-Evaluation   Personal Goal #2  Pt to build a healthy plate including vegetables, fruits, whole grains, and low-fat dairy products in a heart healthy meal plan.      Personal Goal #3 Re-Evaluation   Personal Goal #3  CBG concentrations in the normal range or as close to normal as is safely possible       Psychosocial: Target Goals: Acknowledge presence or absence of significant depression and/or stress, maximize coping skills, provide positive support system. Participant is able to verbalize types and ability to use techniques and skills needed for reducing stress and depression.  Initial  Review & Psychosocial Screening:   Quality of Life Scores:  Scores of 19 and below usually indicate a poorer quality of life in these areas.  A difference of  2-3 points is a clinically meaningful difference.  A difference of 2-3 points in the total score of the Quality of Life Index has been associated with significant improvement in overall quality of life, self-image, physical symptoms, and general health in  studies assessing change in quality of life.  PHQ-9: Recent Review Flowsheet Data    Depression screen [facility] 2/9 07/25/2019 05/08/2019   Decreased Interest 0 0   Down, Depressed, Hopeless 0 0   PHQ - 2 Score 0 0   Altered sleeping - 0   Tired, decreased energy - 0   Change in appetite - 0   Feeling bad or failure about yourself  - 0   Trouble concentrating - 0   Moving slowly or fidgety/restless - 0   Suicidal thoughts - 0   PHQ-9 Score - 0     Interpretation of Total Score  Total Score Depression Severity:  1-4 = Minimal depression, 5-9 = Mild depression, 10-14 = Moderate depression, 15-19 = Moderately severe depression, 20-27 = Severe depression   Psychosocial Evaluation and Intervention:   Psychosocial Re-Evaluation: Psychosocial Re-Evaluation    [place] Name 08/01/19 1506 08/15/19 1023           Psychosocial Re-Evaluation   Current issues with  Current Stress Concerns  Current Stress Concerns      Comments  Will continue to  Will continue to monitor. Tobi has not voiced any increased stressors.      Interventions  Encouraged to attend Cardiac Rehabilitation for the exercise;Stress management education  Encouraged to attend Cardiac Rehabilitation for the exercise;Stress management education      Continue Psychosocial Services   Follow up required by staff  Follow up required by staff      Comments  Sidhant has stress related to his CV event and lack of occupation.  Nieves has stress related to his CV event and lack of occupation.        Initial Review   Source of Stress  Concerns  Financial  Financial         Psychosocial Discharge (Final Psychosocial Re-Evaluation): Psychosocial Re-Evaluation - 08/15/19 1023      Psychosocial Re-Evaluation   Current issues with  Current Stress Concerns    Comments  Will continue to monitor. Konstantinos has not voiced any increased stressors.    Interventions  Encouraged to attend Cardiac Rehabilitation for the exercise;Stress management education    Continue Psychosocial Services   Follow up required by staff    Comments  Keishon has stress related to his CV event and lack of occupation.      Initial Review   Source of Stress Concerns  Financial       Vocational Rehabilitation: Provide vocational rehab assistance to qualifying candidates.   Vocational Rehab Evaluation & Intervention:   Education: Education Goals: Education classes will be provided on a weekly basis, covering required topics. Participant will state understanding/return demonstration of topics presented.  Learning Barriers/Preferences:   Education Topics: Hypertension, Hypertension Reduction -Define heart disease and high blood pressure. Discus how high blood pressure affects the body and ways to reduce high blood pressure.   Exercise and Your Heart -Discuss why it is important to exercise, the FITT principles of exercise, normal and abnormal responses to exercise, and how to exercise safely.   Angina -Discuss definition of angina, causes of angina, treatment of angina, and how to decrease risk of having angina.   Cardiac Medications -Review what the following cardiac medications are used for, how they affect the body, and side effects that may occur when taking the medications.  Medications include Aspirin, Beta blockers, calcium channel blockers, ACE Inhibitors, angiotensin receptor blockers, diuretics, digoxin, and antihyperlipidemics.   Congestive Heart Failure -Discuss the definition of CHF, how  to live with CHF, the signs and symptoms of  CHF, and how keep track of weight and sodium intake.   Heart Disease and Intimacy -Discus the effect sexual activity has on the heart, how changes occur during intimacy as we age, and safety during sexual activity.   Smoking Cessation / COPD -Discuss different methods to quit smoking, the health benefits of quitting smoking, and the definition of COPD.   Nutrition I: Fats -Discuss the types of cholesterol, what cholesterol does to the heart, and how cholesterol levels can be controlled.   Nutrition II: Labels -Discuss the different components of food labels and how to read food label   Heart Parts/Heart Disease and PAD -Discuss the anatomy of the heart, the pathway of blood circulation through the heart, and these are affected by heart disease.   Stress I: Signs and Symptoms -Discuss the causes of stress, how stress may lead to anxiety and depression, and ways to limit stress.   Stress II: Relaxation -Discuss different types of relaxation techniques to limit stress.   Warning Signs of Stroke / TIA -Discuss definition of a stroke, what the signs and symptoms are of a stroke, and how to identify when someone is having stroke.   Knowledge Questionnaire Score:   Core Components/Risk Factors/Patient Goals at Admission:   Core Components/Risk Factors/Patient Goals Review:  Goals and Risk Factor Review    Row Name 07/30/19 1435 07/30/19 1436 08/15/19 1024 08/26/19 1128 09/25/19 1537     Core Components/Risk Factors/Patient Goals Review   Personal Goals Review  Weight Management/Obesity;Lipids;Tobacco Cessation;Diabetes;Hypertension;Stress  Weight Management/Obesity;Lipids;Tobacco Cessation;Diabetes;Hypertension;Stress  Weight Management/Obesity;Lipids;Tobacco Cessation;Diabetes;Hypertension;Stress  Weight Management/Obesity;Lipids;Tobacco Cessation;Diabetes;Hypertension;Stress  Weight Management/Obesity;Lipids;Tobacco Cessation;Diabetes;Hypertension;Stress   Review  --  Jarrius  started exercise on 07/29/19 and exercised without difficulty.  Jaxn has done well with exercise. Raekwon's vital signs have been stable.  Iliya has done well with exercise. Dee's vital signs have been stable.  Cardiac rehab has been on hold due to the covid 19 pandemic and re assignment of staff   Expected Outcomes  --  Grace will continue to particpate in phase 2 cardiac rehab for exercise, nutrition and lifestyle modifications.  Travonte will continue to particpate in phase 2 cardiac rehab for exercise, nutrition and lifestyle modifications.  Cardiac rehab is currently on hold due to [place] 19 pandemic. Shloimy is not interested in virtual cardiac rehab at this time but would like to resume phase 2 cardiac rehab when the program reopens.  cardiac rehab is resuming in person rehab on 10/07/19      Core Components/Risk Factors/Patient Goals at Discharge (Final Review):  Goals and Risk Factor Review - 09/25/19 1537      Core Components/Risk Factors/Patient Goals Review   Personal Goals Review  Weight Management/Obesity;Lipids;Tobacco Cessation;Diabetes;Hypertension;Stress    Review  Cardiac rehab has been on hold due to the covid 19 pandemic and re assignment of staff    Expected Outcomes  cardiac rehab is resuming in person rehab on 10/07/19       ITP Comments: ITP Comments    [place] Name 07/30/19 1433 08/15/19 1021 09/25/19 1535       ITP Comments  30 Day ITP Review. Patient started exercise on 07/29/19 and exercised without difficulty.  30 Day ITP Review. Patient is with good participation and attendance in phase 2 cardiac rehab. Quatavious's CBG's have been stable.  30 Day ITP Review. Phase 2 cardiac rehab is resuming in person rehab on 10/07/19 for exercise.        Comments:  See ITP Comments.Barnet Pall, RN,BSN 09/25/2019 3:42 PM

## 2019-09-26 ENCOUNTER — Telehealth (HOSPITAL_COMMUNITY): Payer: Self-pay

## 2019-09-26 MED FILL — ATORVASTATIN 80 MG TABLET: 80 | 30 days supply | Qty: 30 | Fill #2

## 2019-09-26 NOTE — Telephone Encounter (Signed)
Called pt and scheduled his 14 remaining cardiac rehab sessions. Pt will come in on 10/07/2019@ 9:15am and will graduate on 11/06/2019.

## 2019-10-04 ENCOUNTER — Telehealth (HOSPITAL_COMMUNITY): Payer: Self-pay | Admitting: Family Medicine

## 2019-10-07 ENCOUNTER — Encounter: Payer: Self-pay | Admitting: Internal Medicine

## 2019-10-07 ENCOUNTER — Ambulatory Visit: Payer: Medicaid Other | Attending: Internal Medicine | Admitting: Internal Medicine

## 2019-10-07 ENCOUNTER — Encounter (HOSPITAL_COMMUNITY): Payer: Medicaid Other

## 2019-10-07 ENCOUNTER — Other Ambulatory Visit: Payer: Self-pay

## 2019-10-07 ENCOUNTER — Telehealth (HOSPITAL_COMMUNITY): Payer: Self-pay | Admitting: *Deleted

## 2019-10-07 VITALS — BP 122/74 | HR 69 | Ht 68.0 in | Wt 187.6 lb

## 2019-10-07 DIAGNOSIS — Z87891 Personal history of nicotine dependence: Secondary | ICD-10-CM | POA: Diagnosis not present

## 2019-10-07 DIAGNOSIS — J449 Chronic obstructive pulmonary disease, unspecified: Secondary | ICD-10-CM | POA: Insufficient documentation

## 2019-10-07 DIAGNOSIS — R450 Nervousness: Secondary | ICD-10-CM | POA: Diagnosis not present

## 2019-10-07 DIAGNOSIS — T887XXA Unspecified adverse effect of drug or medicament, initial encounter: Secondary | ICD-10-CM | POA: Diagnosis not present

## 2019-10-07 DIAGNOSIS — Z7982 Long term (current) use of aspirin: Secondary | ICD-10-CM | POA: Insufficient documentation

## 2019-10-07 DIAGNOSIS — I252 Old myocardial infarction: Secondary | ICD-10-CM | POA: Insufficient documentation

## 2019-10-07 DIAGNOSIS — R7303 Prediabetes: Secondary | ICD-10-CM | POA: Insufficient documentation

## 2019-10-07 DIAGNOSIS — I2510 Atherosclerotic heart disease of native coronary artery without angina pectoris: Secondary | ICD-10-CM | POA: Insufficient documentation

## 2019-10-07 DIAGNOSIS — I255 Ischemic cardiomyopathy: Secondary | ICD-10-CM | POA: Insufficient documentation

## 2019-10-07 DIAGNOSIS — R519 Headache, unspecified: Secondary | ICD-10-CM | POA: Diagnosis not present

## 2019-10-07 DIAGNOSIS — E785 Hyperlipidemia, unspecified: Secondary | ICD-10-CM | POA: Insufficient documentation

## 2019-10-07 DIAGNOSIS — Z79899 Other long term (current) drug therapy: Secondary | ICD-10-CM | POA: Diagnosis not present

## 2019-10-07 DIAGNOSIS — Z8249 Family history of ischemic heart disease and other diseases of the circulatory system: Secondary | ICD-10-CM | POA: Insufficient documentation

## 2019-10-07 DIAGNOSIS — Z955 Presence of coronary angioplasty implant and graft: Secondary | ICD-10-CM | POA: Diagnosis not present

## 2019-10-07 DIAGNOSIS — N644 Mastodynia: Secondary | ICD-10-CM | POA: Insufficient documentation

## 2019-10-07 DIAGNOSIS — Z9189 Other specified personal risk factors, not elsewhere classified: Secondary | ICD-10-CM

## 2019-10-07 LAB — POCT GLYCOSYLATED HEMOGLOBIN (HGB A1C): HbA1c, POC (controlled diabetic range): 5.8 % (ref 0.0–7.0)

## 2019-10-07 LAB — GLUCOSE, POCT (MANUAL RESULT ENTRY): POC Glucose: 191 mg/dl — AB (ref 70–99)

## 2019-10-07 NOTE — Progress Notes (Signed)
Patient ID: Stephen Jennings, male    DOB: [birth year]  MRN: [id number]  CC: Diabetes   Subjective: Stephen Jennings is a 64 y.o. male who presents for chronic ds management and to establish with me as PCP.  He had previous seen Dr. Joya Jennings last yr His concerns today include:  Patient with history of CAD(NSTEMI with stent to LAD 03/2019), ICM with last EF 45-50%, former smoker, COPD, pre-DM 2, anemia  C/o daily headache x few mths -all over the head -morning headaches about 3 x a wk. Wife never told him that he snores.   -No N/V, photophobia or phenophobia -blurred vision sometimes -"starts small then it builds up a little."  -not on daily nitrates for CAD Takes one Tylenol 500 mg which helps but HA returns in about 5 hrs.  Takes 1-2 a day.  Never really lays down to see if this makes it better.  Nothing makes it worse No previous history of headaches. -not sleeping well since MI.  Afraid that he may not wake up.    Pre-DM Last A1C:   Results for orders placed or performed in visit on 10/07/19  POCT glucose (manual entry)  Result Value Ref Range   POC Glucose 191 (A) 70 - 99 mg/dl  POCT glycosylated hemoglobin (Hb A1C)  Result Value Ref Range   Hemoglobin A1C     HbA1c POC (<> result, manual entry)     HbA1c, POC (prediabetic range)     HbA1c, POC (controlled diabetic range) 5.8 0.0 - 7.0 %  Diabetes listed on his chart but initial A1c was done during hospitalization last year and it was 6 which was in the range for prediabetes not diabetes.  He was started on Metformin at that time. Med Adherence:  _0  Yes -stopped Metformin 1 mth ago because it was making him feel bad.  Started on Jardiance by cardiology early this mth Medication side effects:  _1  Yes    _2  No Home Monitoring?  _3  Yes before and after BF    _4  No Home glucose results range: before BF 120s and after BF in the 130s Diet Adherence: _5  Yes  - he has cut back on fried foods.  Eating more grilled meats Exercise: _6   Yes  -rides a stationay bike for 30 mins daily Hypoglycemic episodes?: _7  Yes    _8  No not to my knowledge Numbness of the feet? _9  Yes -intermittent numbness in legs Retinopathy hx? _10  Yes    _11  No Last eye exam: 1.5 yrs ago Comments:   Feels one of his meds makes him feels "antsy like fidgety" x 2-3 mths.  Not sure which one it is.    Nipples sore when they rub against his shirt  He was on spironolactone but he states this was discontinued after he complained of the same to the cardiologist.  Placed on Inspra instead   He will be having 5 of his teeth extracted tomorrow with the hopes of getting dentures afterwards.  Patient Active Problem List   Diagnosis Date Noted  . History of ST elevation myocardial infarction (STEMI) 05/08/2019  . Ischemic cardiomyopathy 05/08/2019  . 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 History of placement of stent in LAD coronary artery 05/08/2019  . Type 2 diabetes mellitus with complications ([place]) 21/30/8657  . Hyperlipidemia associated with type 2 diabetes mellitus ([place]) 05/08/2019     Current Outpatient Medications on File Prior to Visit  Medication Sig Dispense Refill  .  aspirin 81 MG chewable tablet CHEW 1 TABLET BY MOUTH DAILY. 30 tablet 2  . atorvastatin (LIPITOR) 80 MG tablet TAKE 1 TABLET BY MOUTH DAILY AT 6 PM. 30 tablet 2  . Blood Glucose Monitoring Suppl (TRUE METRIX METER) w/Device KIT Use to measure blood sugar twice a day 1 kit 0  . carvedilol (COREG) 3.125 MG tablet TAKE 1 TABLET BY MOUTH 2 TIMES DAILY WITH A MEAL. 60 tablet 2  . empagliflozin (JARDIANCE) 10 MG TABS tablet Take 10 mg by mouth daily before breakfast. 30 tablet 11  . eplerenone (INSPRA) 25 MG tablet Take 1 tablet (25 mg total) by mouth daily. 30 tablet 11  . glucose blood (TRUE METRIX BLOOD GLUCOSE TEST) test strip Use as instructed 100 each 12  . Hyprom-Naphaz-Polysorb-Zn Sulf (CLEAR EYES COMPLETE) SOLN Apply 1 drop to eye 3 (three)  times daily. 15 mL 1  . losartan (COZAAR) 50 MG tablet Take 25 mg by mouth daily.    . nitroGLYCERIN (NITROSTAT) 0.4 MG SL tablet Place 1 tablet (0.4 mg total) under the tongue every 5 (five) minutes x 3 doses as needed for chest pain. 25 tablet 12  . ticagrelor (BRILINTA) 90 MG TABS tablet Take 1 tablet (90 mg total) by mouth 2 (two) times daily. 60 tablet 2  . TRUEplus Lancets 28G MISC Use to measure blood sugar twice a day 100 each 1   No current facility-administered medications on file prior to visit.    Allergies  Allergen Reactions  . Spironolactone     gynecomastia  . Metformin And Related Anxiety and Palpitations    Social History   Socioeconomic History  . Marital status: Married    Spouse name: Not on file  . Number of children: Not on file  . Years of education: 8  . Highest education level: 8th grade  Occupational History  . Not on file  Tobacco Use  . Smoking status: Former Smoker    Quit date: 04/08/2019    Years since quitting: 0.5  . Smokeless tobacco: Never Used  Substance and Sexual Activity  . Alcohol use: No  . Drug use: No  . Sexual activity: Not on file  Other Topics Concern  . Not on file  Social History Narrative  . Not on file   Social Determinants of Health   Financial Resource Strain: High Risk  . Difficulty of Paying Living Expenses: Hard  Food Insecurity:   . Worried About Charity fundraiser in the Last Year: Not on file  . Ran Out of Food in the Last Year: Not on file  Transportation Needs: No Transportation Needs  . Lack of Transportation (Medical): No  . Lack of Transportation (Non-Medical): No  Physical Activity: Insufficiently Active  . Days of Exercise per Week: 4 days  . Minutes of Exercise per Session: 30 min  Stress: Stress Concern Present  . Feeling of Stress : Rather much  Social Connections:   . Frequency of Communication with Friends and Family: Not on file  . Frequency of Social Gatherings with Friends and Family:  Not on file  . Attends Religious Services: Not on file  . Active Member of Clubs or Organizations: Not on file  . Attends Archivist Meetings: Not on file  . Marital Status: Not on file  Intimate Partner Violence:   . Fear of Current or Ex-Partner: Not on file  . Emotionally Abused: Not on file  . Physically Abused: Not on file  . Sexually Abused:  Not on file    Family History  Problem Relation Age of Onset  . Hypertension Mother   . Heart failure Mother     Past Surgical History:  Procedure Laterality Date  . CARDIAC CATHETERIZATION    . CORONARY/GRAFT ACUTE MI REVASCULARIZATION N/A 03/31/2019   Procedure: Coronary/Graft Acute MI Revascularization;  Surgeon: Troy Sine, MD;  Location: [place] [facility];  Service: Cardiovascular;  Laterality: N/A;  . ICD IMPLANT N/A 04/15/2019   Procedure: ICD IMPLANT;  Surgeon: Constance Haw, MD;  Location: [place] [facility];  Service: Cardiovascular;  Laterality: N/A;  . LEFT HEART CATH AND CORONARY ANGIOGRAPHY N/A 03/31/2019   Procedure: LEFT HEART CATH AND CORONARY ANGIOGRAPHY;  Surgeon: Troy Sine, MD;  Location: [place];  Service: Cardiovascular;  Laterality: N/A;  . NO PAST SURGERIES    . RIGHT HEART CATH N/A 03/31/2019   Procedure: RIGHT HEART CATH;  Surgeon: Troy Sine, MD;  Location: [place] [facility];  Service: Cardiovascular;  Laterality: N/A;  . VENTRICULAR ASSIST DEVICE INSERTION N/A 03/31/2019   Procedure: VENTRICULAR ASSIST DEVICE INSERTION;  Surgeon: Troy Sine, MD;  Location: [place] [facility];  Service: Cardiovascular;  Laterality: N/A;    ROS: Review of Systems Negative except as stated above  PHYSICAL EXAM: BP 122/74   Pulse 69   Ht _0  (1.727 m)   Wt 187 lb 9.6 oz (85.1 kg)   SpO2 96%   BMI 28.52 kg/m   Wt Readings from Last 3 Encounters:  10/07/19 187 lb 9.6 oz (85.1 kg)  09/23/19 191 lb 9.6 oz (86.9 kg)  08/12/19 188 lb 6.4 oz (85.5 kg)    Physical Exam  General  appearance - alert, well appearing, and in no distress Mental status - normal mood, behavior, speech, dress, motor activity, and thought processes Eyes - pupils equal and reactive, extraocular eye movements intact Neck - supple, no significant adenopathy Chest - clear to auscultation, no wheezes, rales or rhonchi, symmetric air entry Heart - normal rate, regular rhythm, normal S1, S2, no murmurs, rubs, clicks or gallops Breasts: No gynecomastia.  No palpable masses.  No nipple discharge Extremities - peripheral pulses normal, no pedal edema, no clubbing or cyanosis Neurologic exam: Cranial nerves grossly intact.  Power 5/5 bilaterally.  No muscle rigidity.  Gross sensation intact.  Gait is stable. CMP Latest Ref Rng & Units 09/23/2019 08/21/2019 08/12/2019  Glucose 70 - 99 mg/dL 117(H) 116(H) 112(H)  BUN 8 - 23 mg/dL _1 Creatinine 0.61 - 1.24 mg/dL 0.96 0.99 0.94  Sodium 135 - 145 mmol/L 141 140 140  Potassium 3.5 - 5.1 mmol/L 4.5 4.6 4.5  Chloride 98 - 111 mmol/L 106 104 107  CO2 22 - 32 mmol/L _2 Calcium 8.9 - 10.3 mg/dL 9.2 9.5 9.1  Total Protein 6.5 - 8.1 g/dL 6.6 - -  Total Bilirubin 0.3 - 1.2 mg/dL 0.7 - -  Alkaline Phos 38 - 126 U/L 75 - -  AST 15 - 41 U/L 30 - -  ALT 0 - 44 U/L 42 - -   Lipid Panel     Component Value Date/Time   CHOL 113 09/23/2019 1140   TRIG 79 09/23/2019 1140   HDL 35 (L) 09/23/2019 1140   CHOLHDL 3.2 09/23/2019 1140   VLDL 16 09/23/2019 1140   LDLCALC 62 09/23/2019 1140    CBC    Component Value Date/Time   WBC 6.4 06/20/2019  1222   RBC 4.38 06/20/2019 1222   HGB 12.8 (L) 06/20/2019 1222   HCT 40.0 06/20/2019 1222   PLT 229 06/20/2019 1222   MCV 91.3 06/20/2019 1222   MCH 29.2 06/20/2019 1222   MCHC 32.0 06/20/2019 1222   RDW 14.5 06/20/2019 1222   LYMPHSABS 0.9 04/09/2019 0615   MONOABS 0.9 04/09/2019 0615   EOSABS 0.3 04/09/2019 0615   BASOSABS 0.0 04/09/2019 0615    ASSESSMENT AND PLAN: 1. Chronic daily headache -Does  not have typical features to suggest migraine or cluster headaches. I told him to ask his wife whether he snores as this can be a clue to any underlying OSA. -In any event, due to new onset headache over the age of 15, we will get imaging and will refer to neurology - Ambulatory referral to Neurology - CT Head Wo Contrast; Future  2. Prediabetes Advised patient that when he gets the jittery feeling he should check his blood sugars to make sure that they are not dropping low. - POCT glucose (manual entry) - POCT glycosylated hemoglobin (Hb A1C) - Microalbumin / creatinine urine ratio - Ambulatory referral to Ophthalmology  3. Sore nipple Inspra can cause mastalgia in men and is likely the cause here.  He would need to speak with his cardiologist as I am not sure any other substitution can be done at this time.  It sounds as though the spironolactone was stopped for the same reason  4. Need for dental care Advised patient to touch base with his cardiologist today to inquire whether he has to stop the Brilinta prior to dental extractions since he will be having more than 1 teeth taken out.  If he needs to stop it for a few days prior, he may have to reschedule his dental procedure appointment  5. Side effect of medication Patient states that one of his medication makes him feel antsy but he is not sure which one.  Advised him to check blood sugar when he feels antsy.  He may also try spacing out when he takes some of his medications instead of taking them all at the same time to figure out which one may be causing this side effect.     Patient was given the opportunity to ask questions.  Patient verbalized understanding of the plan and was able to repeat key elements of the plan.   Orders Placed This Encounter  Procedures  . CT Head Wo Contrast  . Microalbumin / creatinine urine ratio  . Ambulatory referral to Ophthalmology  . Ambulatory referral to Neurology  . POCT glucose (manual  entry)  . POCT glycosylated hemoglobin (Hb A1C)     Requested Prescriptions    No prescriptions requested or ordered in this encounter    Return in about 6 weeks (around 11/18/2019).  Karle Plumber, MD, FACP

## 2019-10-07 NOTE — Patient Instructions (Signed)
Please find out from your wife with the use snore very loud at nights.  Please check your blood sugars whenever you feel antsy.  Please contact your cardiologist today and let them know that you will be having 5 teeth extractions and inquire whether you need to stop the Brilinta and if so for how long.

## 2019-10-07 NOTE — Progress Notes (Signed)
Having headaches everyday. States ine medication makes him very antsy and he can not sit still, does not know which medication.

## 2019-10-07 NOTE — Telephone Encounter (Signed)
Patient has to have 5 teeth extracted and left VM asking how long he needs to hold his blood thinner before and after procedure.  Routed to [place] for advice

## 2019-10-07 NOTE — Telephone Encounter (Signed)
1 day before and 1 day after

## 2019-10-08 DIAGNOSIS — R519 Headache, unspecified: Secondary | ICD-10-CM | POA: Insufficient documentation

## 2019-10-08 NOTE — Telephone Encounter (Signed)
Left detailed VM.  

## 2019-10-09 ENCOUNTER — Encounter (HOSPITAL_COMMUNITY): Payer: Medicaid Other

## 2019-10-11 ENCOUNTER — Encounter (HOSPITAL_COMMUNITY): Payer: Medicaid Other

## 2019-10-11 MED FILL — DIGOXIN 0.125 MG TABLET: 125 | 30 days supply | Qty: 30 | Fill #2

## 2019-10-11 MED FILL — EPLERENONE 25 MG TABS: 25 | 30 days supply | Qty: 30 | Fill #2

## 2019-10-11 MED FILL — CARVEDILOL 3.125 MG TABLET: 3.125 | 30 days supply | Qty: 60 | Fill #2

## 2019-10-14 ENCOUNTER — Ambulatory Visit (HOSPITAL_COMMUNITY)
Admission: RE | Admit: 2019-10-14 | Discharge: 2019-10-14 | Disposition: A | Discharge status: Home / Self Care | Payer: Medicaid Other | Source: Ambulatory Visit | Attending: Internal Medicine | Admitting: Internal Medicine

## 2019-10-14 ENCOUNTER — Encounter (HOSPITAL_COMMUNITY): Payer: Medicaid Other

## 2019-10-14 ENCOUNTER — Other Ambulatory Visit: Payer: Self-pay

## 2019-10-14 DIAGNOSIS — R519 Headache, unspecified: Secondary | ICD-10-CM | POA: Insufficient documentation

## 2019-10-16 ENCOUNTER — Other Ambulatory Visit: Payer: Self-pay

## 2019-10-16 ENCOUNTER — Encounter (HOSPITAL_COMMUNITY)
Admission: RE | Admit: 2019-10-16 | Discharge: 2019-10-16 | Disposition: A | Discharge status: Home / Self Care | Payer: Medicaid Other | Source: Ambulatory Visit | Attending: Cardiovascular Disease | Admitting: Cardiovascular Disease

## 2019-10-16 ENCOUNTER — Telehealth: Payer: Self-pay | Admitting: Internal Medicine

## 2019-10-16 DIAGNOSIS — F32A Depression, unspecified: Secondary | ICD-10-CM

## 2019-10-16 DIAGNOSIS — I2102 ST elevation (STEMI) myocardial infarction involving left anterior descending coronary artery: Secondary | ICD-10-CM | POA: Diagnosis not present

## 2019-10-16 DIAGNOSIS — Z955 Presence of coronary angioplasty implant and graft: Secondary | ICD-10-CM | POA: Insufficient documentation

## 2019-10-16 DIAGNOSIS — F329 Major depressive disorder, single episode, unspecified: Secondary | ICD-10-CM

## 2019-10-16 NOTE — Telephone Encounter (Signed)
-----   Message from Noel Christmas, RN sent at 10/16/2019 10:35 AM EST ----- Regarding: Pt concerns Hello Dr. Wynetta Emery,  My name is Jiles Garter.  I am a nurse at [facility] outpatient cardiac rehab where your new patient, Mr. Yanik, has returned to exercise.  I wanted to inform you of a conversation with Mr. Granucci from this morning.  I read the OV note form 10/07/19 where Mr. Gentilcore expressed feeling jittery and not being able to sleep at night d/t fear of not waking up.  This was discussed with Mr. Danowski.  He reports continuing to feel jittery, anxious, and depressed since his STEMI and subsequent complicated hospital course.  He does not report any low blood sugars.  He states when he feels jittery, he will go for a walk or practice deep breathing.  These will provide temporary relief.  He remains concerned that it might be a medication and disclosed that he would stopped taking it, but did not specify which medication other than the "little white pill."    Mr. Scholtes was asked if he would be interested in speaking with a counselor/psychologist for his anxiety and depression.  He was agreeable to this and stated that he would call your office.  From this conversation, I am curious if this jittery feeling is a manifestation of his anxiety and depression and I have concerns with him stopping random medications.  I am hopeful that he will reach out but in case he does not, I wanted you to be aware.  Thank you for your time!  Jiles Garter, BSN, RN, [employer]

## 2019-10-16 NOTE — Progress Notes (Signed)
Daily Session Note  Patient Details  Name: Stephen Jennings MRN: [id number] Date of Birth: 02/17/1956 Referring Provider:     [facility] from 07/25/2019 in [place]  Referring Provider  Dr. Shelva Majestic      Encounter Date: 10/16/2019  Check In: Session Check In - 10/16/19 0926      Check-In   Supervising physician immediately available to respond to emergencies  [facility] immediately available    Physician(s)  Dr. Cyndia Skeeters    Location  [facility]    Staff Present  Deitra Mayo, BS, ACSM CEP, Exercise Physiologist;Olinty Celesta Aver, MS, ACSM CEP, Exercise Physiologist;Marilla Boddy Karle Starch, RN, Luisa Hart, RN, BSN;Carlette Wilber Oliphant, RN, BSN    Virtual Visit  No    Medication changes reported      No    Fall or balance concerns reported     No    Warm-up and Cool-down  Performed on first and last piece of equipment    Resistance Training Performed  No    VAD Patient?  No    PAD/SET Patient?  No      Pain Assessment   Currently in Pain?  No/denies    Multiple Pain Sites  No       Capillary Blood Glucose: No results found for this or any previous visit (from the past 24 hour(s)).    Social History   Tobacco Use  Smoking Status Former Smoker  . Quit date: 04/08/2019  . Years since quitting: 0.5  Smokeless Tobacco Never Used    Goals Met:  Exercise tolerated well  Goals Unmet:  Not Applicable  Comments: Pt started cardiac rehab today.  Pt tolerated light exercise without difficulty. VSS, telemetry-SR, asymptomatic.  Stephen Jennings resumed exercise today and reported some anxiety and depression related to his CV event.  He is unable to sleep for fear of not waking up.  Counseling services offered to patient.  He states he will reach out to PCP.  PCP notified of conversation.    Dr. Fransico Him is Medical Director for Cardiac Rehab at [facility].

## 2019-10-16 NOTE — Progress Notes (Signed)
Nutrition Note  Spoke with Stephen Jennings during exercise. We reviewed his diet recall and progress made towards goals during cardiac rehab in person closure. He has continued to exercise daily, eliminated sugary beverages, decreased fried foods, and eliminated little debbie cakes.  Breakfast: 2 eggs, 2 bacon Lunch: [place] sandwich on whole wheat bread Dinner: grilled chicken and veggies (romaine, peas, beans, etc) Beverages: water and 1 diet pepsi occasionally  Stephen Jennings has made excellent diet changes and me all the nutrition process goals he set in December.  He again reports feeling fidgety. He reveals feeling anxious and depressed at time since his STEMI. He feels scared to go to sleep for fear of not waking up. He is open to seeing a counselor or psychologist to discuss these things further. He says he will call his PCP to discuss.  Stephen Offer, MS, RDN, LDN

## 2019-10-17 ENCOUNTER — Telehealth: Payer: Self-pay | Admitting: Internal Medicine

## 2019-10-17 DIAGNOSIS — R450 Nervousness: Secondary | ICD-10-CM

## 2019-10-17 NOTE — Telephone Encounter (Signed)
Will forward to Triage

## 2019-10-17 NOTE — Telephone Encounter (Signed)
Pt would like a call back, he states one of his medications is giving him jitters. He does not know which medication is causing this. Please follow up as soon as possible

## 2019-10-17 NOTE — Telephone Encounter (Signed)
Good afternoon Doctor, Pt stated since started Coreg and Jardiance hiving him litters and not feeling well. Asked if there is anything else he could take. Please advice.

## 2019-10-17 NOTE — Telephone Encounter (Signed)
Contacted pt to go over Dr. Wynetta Emery message and CT results. Pt didn't answer lvm asking pt to give me a call at his earliest convenience  If pt calls back please go over Dr. Wynetta Emery message and CT results   CT scan of his head came back okay showing no abnormalities in the brain.  I have submitted a referral for him to see a neurologist.

## 2019-10-18 ENCOUNTER — Other Ambulatory Visit: Payer: Self-pay

## 2019-10-18 ENCOUNTER — Encounter (HOSPITAL_COMMUNITY)
Admission: RE | Admit: 2019-10-18 | Discharge: 2019-10-18 | Disposition: A | Discharge status: Home / Self Care | Payer: Medicaid Other | Source: Ambulatory Visit | Attending: Cardiovascular Disease | Admitting: Cardiovascular Disease

## 2019-10-18 ENCOUNTER — Ambulatory Visit: Payer: Medicaid Other | Attending: Internal Medicine

## 2019-10-18 DIAGNOSIS — I2102 ST elevation (STEMI) myocardial infarction involving left anterior descending coronary artery: Secondary | ICD-10-CM | POA: Diagnosis not present

## 2019-10-18 DIAGNOSIS — Z955 Presence of coronary angioplasty implant and graft: Secondary | ICD-10-CM | POA: Diagnosis not present

## 2019-10-18 DIAGNOSIS — R450 Nervousness: Secondary | ICD-10-CM

## 2019-10-18 NOTE — Telephone Encounter (Signed)
Called pt made aware of MD note as instructed by Dr Wynetta Emery.Verbalized understanding  "Tell him to take half of the Jardiance. Please come to the lab to have your thyroid level checked".

## 2019-10-19 LAB — TSH: TSH: 0.568 u[IU]/mL (ref 0.450–4.500)

## 2019-10-21 ENCOUNTER — Encounter (HOSPITAL_COMMUNITY)
Admission: RE | Admit: 2019-10-21 | Discharge: 2019-10-21 | Disposition: A | Discharge status: Home / Self Care | Payer: Medicaid Other | Source: Ambulatory Visit | Attending: Cardiovascular Disease | Admitting: Cardiovascular Disease

## 2019-10-21 ENCOUNTER — Other Ambulatory Visit: Payer: Self-pay

## 2019-10-21 DIAGNOSIS — I2102 ST elevation (STEMI) myocardial infarction involving left anterior descending coronary artery: Secondary | ICD-10-CM | POA: Diagnosis not present

## 2019-10-21 DIAGNOSIS — Z955 Presence of coronary angioplasty implant and graft: Secondary | ICD-10-CM | POA: Diagnosis not present

## 2019-10-22 ENCOUNTER — Telehealth: Payer: Self-pay

## 2019-10-22 NOTE — Telephone Encounter (Signed)
Contacted pt to go over lab results pt is aware and doesn't have any questions or concerns 

## 2019-10-23 ENCOUNTER — Other Ambulatory Visit: Payer: Self-pay

## 2019-10-23 ENCOUNTER — Encounter (HOSPITAL_COMMUNITY)
Admission: RE | Admit: 2019-10-23 | Discharge: 2019-10-23 | Disposition: A | Discharge status: Home / Self Care | Payer: Medicaid Other | Source: Ambulatory Visit | Attending: Cardiovascular Disease | Admitting: Cardiovascular Disease

## 2019-10-23 ENCOUNTER — Ambulatory Visit (INDEPENDENT_AMBULATORY_CARE_PROVIDER_SITE_OTHER): Payer: Medicaid Other | Admitting: *Deleted

## 2019-10-23 DIAGNOSIS — Z955 Presence of coronary angioplasty implant and graft: Secondary | ICD-10-CM | POA: Diagnosis not present

## 2019-10-23 DIAGNOSIS — I2102 ST elevation (STEMI) myocardial infarction involving left anterior descending coronary artery: Secondary | ICD-10-CM

## 2019-10-23 DIAGNOSIS — I255 Ischemic cardiomyopathy: Secondary | ICD-10-CM | POA: Diagnosis not present

## 2019-10-23 LAB — CUP PACEART REMOTE DEVICE CHECK
Battery Remaining Longevity: 92 mo
Battery Remaining Percentage: 92 %
Battery Voltage: 3.16 V
Brady Statistic AP VP Percent: 1 %
Brady Statistic AP VS Percent: 8.9 %
Brady Statistic AS VP Percent: 1 %
Brady Statistic AS VS Percent: 91 %
Brady Statistic RA Percent Paced: 8.3 %
Brady Statistic RV Percent Paced: 1 %
Date Time Interrogation Session: 20210303040017
HighPow Impedance: 62 Ohm
HighPow Impedance: 62 Ohm
Implantable Lead Implant Date: 20200824
Implantable Lead Implant Date: 20200824
Implantable Lead Location: 753859
Implantable Lead Location: 753860
Implantable Pulse Generator Implant Date: 20200824
Lead Channel Impedance Value: 340 Ohm
Lead Channel Impedance Value: 380 Ohm
Lead Channel Pacing Threshold Amplitude: 0.75 V
Lead Channel Pacing Threshold Amplitude: 0.75 V
Lead Channel Pacing Threshold Pulse Width: 0.5 ms
Lead Channel Pacing Threshold Pulse Width: 0.5 ms
Lead Channel Sensing Intrinsic Amplitude: 1.7 mV
Lead Channel Sensing Intrinsic Amplitude: 11.9 mV
Lead Channel Setting Pacing Amplitude: 2 V
Lead Channel Setting Pacing Amplitude: 2.5 V
Lead Channel Setting Pacing Pulse Width: 0.5 ms
Lead Channel Setting Sensing Sensitivity: 0.5 mV
Pulse Gen Serial Number: 1300954

## 2019-10-23 NOTE — Progress Notes (Signed)
ICD Remote  

## 2019-10-25 ENCOUNTER — Other Ambulatory Visit: Payer: Self-pay

## 2019-10-25 ENCOUNTER — Encounter (HOSPITAL_COMMUNITY)
Admission: RE | Admit: 2019-10-25 | Discharge: 2019-10-25 | Disposition: A | Discharge status: Home / Self Care | Payer: Medicaid Other | Source: Ambulatory Visit | Attending: Cardiovascular Disease | Admitting: Cardiovascular Disease

## 2019-10-25 DIAGNOSIS — Z955 Presence of coronary angioplasty implant and graft: Secondary | ICD-10-CM | POA: Diagnosis not present

## 2019-10-25 DIAGNOSIS — I2102 ST elevation (STEMI) myocardial infarction involving left anterior descending coronary artery: Secondary | ICD-10-CM | POA: Diagnosis not present

## 2019-10-28 ENCOUNTER — Encounter (HOSPITAL_COMMUNITY)
Admission: RE | Admit: 2019-10-28 | Discharge: 2019-10-28 | Disposition: A | Discharge status: Home / Self Care | Payer: Medicaid Other | Source: Ambulatory Visit | Attending: Cardiovascular Disease | Admitting: Cardiovascular Disease

## 2019-10-28 ENCOUNTER — Ambulatory Visit: Payer: Medicaid Other | Attending: Internal Medicine | Admitting: Internal Medicine

## 2019-10-28 ENCOUNTER — Encounter: Payer: Self-pay | Admitting: Internal Medicine

## 2019-10-28 ENCOUNTER — Other Ambulatory Visit: Payer: Self-pay

## 2019-10-28 VITALS — BP 154/85 | HR 66 | Temp 97.5°F | Resp 16 | Wt 187.4 lb

## 2019-10-28 DIAGNOSIS — Z87891 Personal history of nicotine dependence: Secondary | ICD-10-CM | POA: Diagnosis not present

## 2019-10-28 DIAGNOSIS — I255 Ischemic cardiomyopathy: Secondary | ICD-10-CM | POA: Diagnosis not present

## 2019-10-28 DIAGNOSIS — Z79899 Other long term (current) drug therapy: Secondary | ICD-10-CM | POA: Insufficient documentation

## 2019-10-28 DIAGNOSIS — I2510 Atherosclerotic heart disease of native coronary artery without angina pectoris: Secondary | ICD-10-CM | POA: Insufficient documentation

## 2019-10-28 DIAGNOSIS — R251 Tremor, unspecified: Secondary | ICD-10-CM | POA: Diagnosis not present

## 2019-10-28 DIAGNOSIS — Z955 Presence of coronary angioplasty implant and graft: Secondary | ICD-10-CM

## 2019-10-28 DIAGNOSIS — G4489 Other headache syndrome: Secondary | ICD-10-CM | POA: Diagnosis not present

## 2019-10-28 DIAGNOSIS — Z7982 Long term (current) use of aspirin: Secondary | ICD-10-CM | POA: Insufficient documentation

## 2019-10-28 DIAGNOSIS — G4709 Other insomnia: Secondary | ICD-10-CM | POA: Insufficient documentation

## 2019-10-28 DIAGNOSIS — R450 Nervousness: Secondary | ICD-10-CM | POA: Diagnosis not present

## 2019-10-28 DIAGNOSIS — J449 Chronic obstructive pulmonary disease, unspecified: Secondary | ICD-10-CM | POA: Diagnosis not present

## 2019-10-28 DIAGNOSIS — R7303 Prediabetes: Secondary | ICD-10-CM | POA: Insufficient documentation

## 2019-10-28 DIAGNOSIS — I2102 ST elevation (STEMI) myocardial infarction involving left anterior descending coronary artery: Secondary | ICD-10-CM | POA: Diagnosis not present

## 2019-10-28 DIAGNOSIS — I252 Old myocardial infarction: Secondary | ICD-10-CM | POA: Insufficient documentation

## 2019-10-28 LAB — GLUCOSE, POCT (MANUAL RESULT ENTRY): POC Glucose: 107 mg/dl — AB (ref 70–99)

## 2019-10-28 MED ORDER — METOPROLOL SUCCINATE ER 25 MG PO TB24: 25.0000 mg | ORAL_TABLET | Freq: Every day | ORAL | 3 refills | Status: DC

## 2019-10-28 MED ORDER — METOPROLOL SUCCINATE ER 25 MG PO TB24: ORAL_TABLET | ORAL | 3 refills | Status: DC

## 2019-10-28 MED ORDER — TRAZODONE HCL 50 MG PO TABS: ORAL_TABLET | ORAL | 2 refills | Status: DC

## 2019-10-28 NOTE — Patient Instructions (Signed)
Stop the carvedilol. Start metoprolol 25 mg  And take 1/2 tablet daily instead.  Start trazodone at bedtime.

## 2019-10-28 NOTE — Progress Notes (Signed)
Patient ID: Stephen Jennings, male    DOB: 14-Nov-1955  MRN: [id number]  CC: Headache   Subjective: Stephen Jennings is a 64 y.o. male who presents for 3 wks f/u His concerns today include:  Patient with history of CAD(NSTEMI with stent to LAD 03/2019), ICM with last EF 45-50%, former smoker, COPD, pre-DM 2, anemia  HA: CT of head was negative.  Has appt with neurologist later this mth.  Headaches have dec since last visit.  Use to drink 5 cups  Of coffee in mornings.  He cut back to 1.5 cups.  Headaches less   He stopped Coreg about 1 wk ago.  He felt this was causing his jittery feeling.  Feeling better and sleeping a little better -we cut jardiance to 10 mg 1/2 tab daily. I had referred to mental health provider for possible anxiety but pt wants to hold on this. He feels he would do okay if he had med to help with insomnia.  He has had problems sleeping ever since his heart attack.  He is fearful that something may happen in his sleep.     Patient Active Problem List   Diagnosis Date Noted  . Chronic daily headache 10/08/2019  . History of ST elevation myocardial infarction (STEMI) 05/08/2019  . Ischemic cardiomyopathy 05/08/2019  . 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 History of placement of stent in LAD coronary artery 05/08/2019     Current Outpatient Medications on File Prior to Visit  Medication Sig Dispense Refill  . aspirin 81 MG chewable tablet CHEW 1 TABLET BY MOUTH DAILY. 30 tablet 2  . atorvastatin (LIPITOR) 80 MG tablet TAKE 1 TABLET BY MOUTH DAILY AT 6 PM. 30 tablet 2  . Blood Glucose Monitoring Suppl (TRUE METRIX METER) w/Device KIT Use to measure blood sugar twice a day 1 kit 0  . empagliflozin (JARDIANCE) 10 MG TABS tablet Take 10 mg by mouth daily before breakfast. 30 tablet 11  . eplerenone (INSPRA) 25 MG tablet Take 1 tablet (25 mg total) by mouth daily. 30 tablet 11  . glucose blood (TRUE METRIX BLOOD GLUCOSE TEST) test strip Use  as instructed 100 each 12  . Hyprom-Naphaz-Polysorb-Zn Sulf (CLEAR EYES COMPLETE) SOLN Apply 1 drop to eye 3 (three) times daily. 15 mL 1  . losartan (COZAAR) 50 MG tablet Take 25 mg by mouth daily.    . nitroGLYCERIN (NITROSTAT) 0.4 MG SL tablet Place 1 tablet (0.4 mg total) under the tongue every 5 (five) minutes x 3 doses as needed for chest pain. 25 tablet 12  . ticagrelor (BRILINTA) 90 MG TABS tablet Take 1 tablet (90 mg total) by mouth 2 (two) times daily. 60 tablet 2  . TRUEplus Lancets 28G MISC Use to measure blood sugar twice a day 100 each 1   No current facility-administered medications on file prior to visit.    Allergies  Allergen Reactions  . Spironolactone     gynecomastia  . Metformin And Related Anxiety and Palpitations    Social History   Socioeconomic History  . Marital status: Married    Spouse name: Not on file  . Number of children: Not on file  . Years of education: 8  . Highest education level: 8th grade  Occupational History  . Not on file  Tobacco Use  . Smoking status: Former Smoker    Quit date: 04/08/2019    Years since quitting: 0.5  . Smokeless tobacco: Never Used  Substance  and Sexual Activity  . Alcohol use: No  . Drug use: No  . Sexual activity: Not on file  Other Topics Concern  . Not on file  Social History Narrative  . Not on file   Social Determinants of Health   Financial Resource Strain: High Risk  . Difficulty of Paying Living Expenses: Hard  Food Insecurity:   . Worried About Charity fundraiser in the Last Year: Not on file  . Ran Out of Food in the Last Year: Not on file  Transportation Needs: No Transportation Needs  . Lack of Transportation (Medical): No  . Lack of Transportation (Non-Medical): No  Physical Activity: Insufficiently Active  . Days of Exercise per Week: 4 days  . Minutes of Exercise per Session: 30 min  Stress: Stress Concern Present  . Feeling of Stress : Rather much  Social Connections:   .  Frequency of Communication with Friends and Family: Not on file  . Frequency of Social Gatherings with Friends and Family: Not on file  . Attends Religious Services: Not on file  . Active Member of Clubs or Organizations: Not on file  . Attends Archivist Meetings: Not on file  . Marital Status: Not on file  Intimate Partner Violence:   . Fear of Current or Ex-Partner: Not on file  . Emotionally Abused: Not on file  . Physically Abused: Not on file  . Sexually Abused: Not on file    Family History  Problem Relation Age of Onset  . Hypertension Mother   . Heart failure Mother     Past Surgical History:  Procedure Laterality Date  . CARDIAC CATHETERIZATION    . CORONARY/GRAFT ACUTE MI REVASCULARIZATION N/A 03/31/2019   Procedure: Coronary/Graft Acute MI Revascularization;  Surgeon: Troy Sine, MD;  Location: [facility];  Service: Cardiovascular;  Laterality: N/A;  . ICD IMPLANT N/A 04/15/2019   Procedure: ICD IMPLANT;  Surgeon: Constance Haw, MD;  Location: [facility];  Service: Cardiovascular;  Laterality: N/A;  . LEFT HEART CATH AND CORONARY ANGIOGRAPHY N/A 03/31/2019   Procedure: LEFT HEART CATH AND CORONARY ANGIOGRAPHY;  Surgeon: Troy Sine, MD;  Location: [facility];  Service: Cardiovascular;  Laterality: N/A;  . NO PAST SURGERIES    . RIGHT HEART CATH N/A 03/31/2019   Procedure: RIGHT HEART CATH;  Surgeon: Troy Sine, MD;  Location: [facility];  Service: Cardiovascular;  Laterality: N/A;  . VENTRICULAR ASSIST DEVICE INSERTION N/A 03/31/2019   Procedure: VENTRICULAR ASSIST DEVICE INSERTION;  Surgeon: Troy Sine, MD;  Location: [facility];  Service: Cardiovascular;  Laterality: N/A;    ROS: Review of Systems Negative except as stated above  PHYSICAL EXAM: BP (!) 154/85   Pulse 66   Temp (!) 97.5 F (36.4 C)   Resp 16   Wt 187 lb 6.4 oz (85 kg)   SpO2 96%   BMI 28.49 kg/m   Physical Exam General  appearance - alert, well appearing, and in no distress Mental status - normal mood, behavior, speech, dress, motor activity, and thought processes Chest - clear to auscultation, no wheezes, rales or rhonchi, symmetric air entry Heart - normal rate, regular rhythm, normal S1, S2, no murmurs, rubs, clicks or gallops Extremities - peripheral pulses normal, no pedal edema, no clubbing or cyanosis  CMP Latest Ref Rng & Units 09/23/2019 08/21/2019 08/12/2019  Glucose 70 - 99 mg/dL 117(H) 116(H) 112(H)  BUN 8 -  23 mg/dL 12 19 12   Creatinine 0.61 - 1.24 mg/dL 0.96 0.99 0.94  Sodium 135 - 145 mmol/L 141 140 140  Potassium 3.5 - 5.1 mmol/L 4.5 4.6 4.5  Chloride 98 - 111 mmol/L 106 104 107  CO2 22 - 32 mmol/L 27 28 25   Calcium 8.9 - 10.3 mg/dL 9.2 9.5 9.1  Total Protein 6.5 - 8.1 g/dL 6.6 - -  Total Bilirubin 0.3 - 1.2 mg/dL 0.7 - -  Alkaline Phos 38 - 126 U/L 75 - -  AST 15 - 41 U/L 30 - -  ALT 0 - 44 U/L 42 - -   Lipid Panel     Component Value Date/Time   CHOL 113 09/23/2019 1140   TRIG 79 09/23/2019 1140   HDL 35 (L) 09/23/2019 1140   CHOLHDL 3.2 09/23/2019 1140   VLDL 16 09/23/2019 1140   LDLCALC 62 09/23/2019 1140    CBC    Component Value Date/Time   WBC 6.4 06/20/2019 1222   RBC 4.38 06/20/2019 1222   HGB 12.8 (L) 06/20/2019 1222   HCT 40.0 06/20/2019 1222   PLT 229 06/20/2019 1222   MCV 91.3 06/20/2019 1222   MCH 29.2 06/20/2019 1222   MCHC 32.0 06/20/2019 1222   RDW 14.5 06/20/2019 1222   LYMPHSABS 0.9 04/09/2019 0615   MONOABS 0.9 04/09/2019 0615   EOSABS 0.3 04/09/2019 0615   BASOSABS 0.0 04/09/2019 0615    ASSESSMENT AND PLAN: 1. Feeling jittery I doubt the carvedilol was causing the issue but patient states that it got better having stopped the carvedilol.  Therefore I will change to metoprolol. I think he was also consuming too much caffeine for which he has cut back significantly  2. Headache syndrome Improved.  He will keep appointment later this month with  neurology  3. Coronary artery disease involving left main coronary artery See #1 above - metoprolol succinate (TOPROL-XL) 25 MG 24 hr tablet; 1/2 tab PO daily  Dispense: 45 tablet; Refill: 3  4. Prediabetes - POCT glucose (manual entry)  5. Other insomnia Good sleep hygiene discussed and encouraged.  We will try him with a low-dose of trazodone. - traZODone (DESYREL) 50 MG tablet; 1/2 to 1 tab PO QHS  Dispense: 30 tablet; Refill: 2     Patient was given the opportunity to ask questions.  Patient verbalized understanding of the plan and was able to repeat key elements of the plan.   Orders Placed This Encounter  Procedures  . POCT glucose (manual entry)     Requested Prescriptions   Signed Prescriptions Disp Refills  . traZODone (DESYREL) 50 MG tablet 30 tablet 2    Sig: 1/2 to 1 tab PO QHS  . metoprolol succinate (TOPROL-XL) 25 MG 24 hr tablet 45 tablet 3    Sig: 1/2 tab PO daily    Return in about 3 months (around 01/28/2020).  Karle Plumber, MD, FACP

## 2019-10-29 DIAGNOSIS — E119 Type 2 diabetes mellitus without complications: Secondary | ICD-10-CM | POA: Diagnosis not present

## 2019-10-29 DIAGNOSIS — H2513 Age-related nuclear cataract, bilateral: Secondary | ICD-10-CM | POA: Diagnosis not present

## 2019-10-29 NOTE — Progress Notes (Signed)
I have reviewed a Home Exercise Prescription with Stephen Jennings . Stephen Jennings is currently exercising at home.  The patient was advised to walk 2-4 days a week for 30-45 minutes.  Stephen Jennings and I discussed how to progress their exercise prescription.  The patient stated that their goals were to continue riding his stationary bike for 30-45 minutes 2-4 days per week in addition to CR program.  The patient stated that they understand the exercise prescription.  We reviewed exercise guidelines, target heart rate during exercise, RPE Scale, weather conditions, NTG use, endpoints for exercise, warmup and cool down.  Patient is encouraged to come to me with any questions. I will continue to follow up with the patient to assist them with progression and safety.    Deitra Mayo BS, ACSM CEP 10/28/2019 [phone]

## 2019-10-30 ENCOUNTER — Encounter (HOSPITAL_COMMUNITY)
Admission: RE | Admit: 2019-10-30 | Discharge: 2019-10-30 | Disposition: A | Discharge status: Home / Self Care | Payer: Medicaid Other | Source: Ambulatory Visit | Attending: Cardiovascular Disease | Admitting: Cardiovascular Disease

## 2019-10-30 ENCOUNTER — Other Ambulatory Visit: Payer: Self-pay

## 2019-10-30 DIAGNOSIS — Z955 Presence of coronary angioplasty implant and graft: Secondary | ICD-10-CM

## 2019-10-30 DIAGNOSIS — I2102 ST elevation (STEMI) myocardial infarction involving left anterior descending coronary artery: Secondary | ICD-10-CM | POA: Diagnosis not present

## 2019-10-31 NOTE — Progress Notes (Signed)
Cardiac Individual Treatment Plan  Patient Details  Name: Stephen Jennings MRN: [id number] Date of Birth: 1956-05-14 Referring Provider:     [facility] from 07/25/2019 in [place]  Referring Provider  Dr. Shelva Majestic      Initial Encounter Date:    [facility] from 07/25/2019 in [place]  Date  07/25/19      Visit Diagnosis: S/P coronary artery stent placement S/P DES LAD 03/31/19  ST elevation myocardial infarction involving left anterior descending (LAD) coronary artery ([place]) 03/31/19  Patient's Home Medications on Admission:  Current Outpatient Medications:  .  aspirin 81 MG chewable tablet, CHEW 1 TABLET BY MOUTH DAILY., Disp: 30 tablet, Rfl: 2 .  atorvastatin (LIPITOR) 80 MG tablet, TAKE 1 TABLET BY MOUTH DAILY AT 6 PM., Disp: 30 tablet, Rfl: 2 .  Blood Glucose Monitoring Suppl (TRUE METRIX METER) w/Device KIT, Use to measure blood sugar twice a day, Disp: 1 kit, Rfl: 0 .  empagliflozin (JARDIANCE) 10 MG TABS tablet, Take 10 mg by mouth daily before breakfast., Disp: 30 tablet, Rfl: 11 .  eplerenone (INSPRA) 25 MG tablet, Take 1 tablet (25 mg total) by mouth daily., Disp: 30 tablet, Rfl: 11 .  glucose blood (TRUE METRIX BLOOD GLUCOSE TEST) test strip, Use as instructed, Disp: 100 each, Rfl: 12 .  Hyprom-Naphaz-Polysorb-Zn Sulf (CLEAR EYES COMPLETE) SOLN, Apply 1 drop to eye 3 (three) times daily., Disp: 15 mL, Rfl: 1 .  losartan (COZAAR) 50 MG tablet, Take 25 mg by mouth daily., Disp: , Rfl:  .  metoprolol succinate (TOPROL-XL) 25 MG 24 hr tablet, 1/2 tab PO daily, Disp: 45 tablet, Rfl: 3 .  nitroGLYCERIN (NITROSTAT) 0.4 MG SL tablet, Place 1 tablet (0.4 mg total) under the tongue every 5 (five) minutes x 3 doses as needed for chest pain., Disp: 25 tablet, Rfl: 12 .  ticagrelor (BRILINTA) 90 MG TABS tablet, Take 1 tablet (90 mg total) by mouth 2 (two) times daily., Disp: 60  tablet, Rfl: 2 .  traZODone (DESYREL) 50 MG tablet, 1/2 to 1 tab PO QHS, Disp: 30 tablet, Rfl: 2 .  TRUEplus Lancets 28G MISC, Use to measure blood sugar twice a day, Disp: 100 each, Rfl: 1  Past Medical History: Past Medical History:  Diagnosis Date  . Cardiogenic shock ([place])   . CHF (congestive heart failure) ([place])   . Coronary artery disease   . Diabetes mellitus without complication ([place])   . Hyperlipidemia   . Medical history non-contributory     Tobacco Use: Social History   Tobacco Use  Smoking Status Former Smoker  . Quit date: 04/08/2019  . Years since quitting: 0.5  Smokeless Tobacco Never Used    Labs: Recent Review Flowsheet Data    Labs for ITP Cardiac and Pulmonary Rehab Latest Ref Rng & Units 04/10/2019 04/11/2019 04/12/2019 09/23/2019 10/07/2019   Cholestrol 0 - 200 mg/dL - - - 113 -   LDLCALC 0 - 99 mg/dL - - - 62 -   HDL >40 mg/dL - - - 35(L) -   Trlycerides <150 mg/dL - - 99 79 -   Hemoglobin A1c 0.0 - 7.0 % - - - - 5.8   PHART 7.350 - 7.450 - - - - -   PCO2ART 32.0 - 48.0 mmHg - - - - -   HCO3 20.0 - 28.0 mmol/L - - - - -   TCO2 22 - 32 mmol/L - - - - -  ACIDBASEDEF 0.0 - 2.0 mmol/L - - - - -   O2SAT % 61.4 58.4 55.3 - -      Capillary Blood Glucose: Lab Results  Component Value Date   GLUCAP 142 (H) 08/05/2019   GLUCAP 128 (H) 08/02/2019   GLUCAP 94 07/31/2019   GLUCAP 164 (H) 07/31/2019   GLUCAP 121 (H) 07/29/2019     Exercise Target Goals: Exercise Program Goal: Individual exercise prescription set using results from initial 6 min walk test and THRR while considering  patient's activity barriers and safety.   Exercise Prescription Goal: Initial exercise prescription builds to 30-45 minutes a day of aerobic activity, 2-3 days per week.  Home exercise guidelines will be given to patient during program as part of exercise prescription that the participant will acknowledge.  Activity Barriers & Risk Stratification: Activity Barriers & Cardiac  Risk Stratification - 07/25/19 1143      Activity Barriers & Cardiac Risk Stratification   Activity Barriers  Deconditioning;Muscular Weakness    Cardiac Risk Stratification  High       6 Minute Walk: 6 Minute Walk    Row Name 07/25/19 1142         6 Minute Walk   Phase  Initial     Distance  1376 feet     Walk Time  6 minutes     # of Rest Breaks  0     MPH  2.6     METS  3.3     RPE  9     Perceived Dyspnea   0     VO2 Peak  11.79     Symptoms  No     Resting HR  60 bpm     Resting BP  122/64     Resting Oxygen Saturation   98 %     Exercise Oxygen Saturation  during 6 min walk  100 %     Max Ex. HR  76 bpm     Max Ex. BP  132/72     2 Minute Post BP  124/68        Oxygen Initial Assessment:   Oxygen Re-Evaluation:   Oxygen Discharge (Final Oxygen Re-Evaluation):   Initial Exercise Prescription: Initial Exercise Prescription - 07/25/19 1100      Date of Initial Exercise RX and Referring Provider   Date  07/25/19    Referring Provider  Dr. Shelva Majestic    Expected Discharge Date  09/20/19      Recumbant Bike   Level  2    Watts  35    Minutes  15    METs  3.38      NuStep   Level  3    SPM  85    Minutes  15    METs  3      Prescription Details   Frequency (times per week)  3    Duration  Progress to 30 minutes of continuous aerobic without signs/symptoms of physical distress      Intensity   THRR 40-80% of Max Heartrate  63-126    Ratings of Perceived Exertion  11-13      Progression   Progression  Continue to progress workloads to maintain intensity without signs/symptoms of physical distress.      Resistance Training   Training Prescription  Yes    Weight  3 lbs.     Reps  10-15       Perform Capillary Blood Glucose checks as  needed.  Exercise Prescription Changes: Exercise Prescription Changes    Row Name 07/29/19 1000 08/12/19 0915 08/21/19 0915 10/16/19 0915 10/28/19 1100     Response to Exercise   Blood Pressure (Admit)   130/78  108/62  138/66  122/82  132/64   Blood Pressure (Exercise)  132/60  128/72  148/70  148/80  160/80   Blood Pressure (Exit)  130/72  128/72  112/60  114/72  126/64   Heart Rate (Admit)  64 bpm  62 bpm  66 bpm  71 bpm  74 bpm   Heart Rate (Exercise)  97 bpm  111 bpm  88 bpm  92 bpm  90 bpm   Heart Rate (Exit)  69 bpm  62 bpm  63 bpm  71 bpm  70 bpm   Rating of Perceived Exertion (Exercise)  11  11  12  12  12    Symptoms  None  None  None  None  None   Comments  Pt first day of exercise.   --  --  Pt first day returning to exercise.   --   Duration  Continue with 30 min of aerobic exercise without signs/symptoms of physical distress.  Continue with 30 min of aerobic exercise without signs/symptoms of physical distress.  Continue with 30 min of aerobic exercise without signs/symptoms of physical distress.  Continue with 30 min of aerobic exercise without signs/symptoms of physical distress.  Continue with 30 min of aerobic exercise without signs/symptoms of physical distress.   Intensity  THRR unchanged  THRR unchanged  THRR unchanged  THRR unchanged  THRR unchanged     Progression   Progression  Continue to progress workloads to maintain intensity without signs/symptoms of physical distress.  Continue to progress workloads to maintain intensity without signs/symptoms of physical distress.  Continue to progress workloads to maintain intensity without signs/symptoms of physical distress.  Continue to progress workloads to maintain intensity without signs/symptoms of physical distress.  Continue to progress workloads to maintain intensity without signs/symptoms of physical distress.   Average METs  2.1  2.6  3.1  2.9  2.7     Resistance Training   Training Prescription  Yes  Yes  No  No  Yes   Weight  3 lbs.   3 lbs.   --  --  5 lbs.    Reps  10-15  10-15  --  --  10-15   Time  10 Minutes  10 Minutes  --  --  10 Minutes     Interval Training   Interval Training  --  No  No  No  No      Treadmill   MPH  --  --  --  --  2   Grade  --  --  --  --  0   Minutes  --  --  --  --  15   METs  --  --  --  --  2.53     Recumbant Bike   Level  2  2  3  3   --   Watts  35  35  --  --  --   Minutes  15  15  15  15   --   METs  1.9  2.3  2.9  2.8  --     NuStep   Level  3  3  4  4  4    SPM  85  85  85  85  85  Minutes  15  15  15  15  15    METs  2.4  2.9  3.4  3  3      Home Exercise Plan   Plans to continue exercise at  --  --  --  --  Home (comment)   Frequency  --  --  --  --  Add 3 additional days to program exercise sessions.   Initial Home Exercises Provided  --  --  --  --  10/28/19      Exercise Comments: Exercise Comments    Row Name 07/29/19 1027 08/22/19 1337 09/25/19 1432 10/17/19 0801 10/28/19 4765   Exercise Comments  Pt first day of exercise. Pt tolerated exercise Rx well.  Pt is progressing well in the program. Pt declined virtual rehab.  Patient will be contacted to resume participation in the onsite cardiac rehab program after temporary closure due to COVID-19 pandemic.  Pt first day returning to exercise. Pt tolerated exercise well.  Reviewed HEP with Pt. Pt understands goals.      Exercise Goals and Review: Exercise Goals    Row Name 07/25/19 1153             Exercise Goals   Increase Physical Activity  Yes       Intervention  Provide advice, education, support and counseling about physical activity/exercise needs.;Develop an individualized exercise prescription for aerobic and resistive training based on initial evaluation findings, risk stratification, comorbidities and participant's personal goals.       Expected Outcomes  Short Term: Attend rehab on a regular basis to increase amount of physical activity.;Long Term: Add in home exercise to make exercise part of routine and to increase amount of physical activity.;Long Term: Exercising regularly at least 3-5 days a week.       Increase Strength and Stamina  Yes       Intervention  Provide advice,  education, support and counseling about physical activity/exercise needs.;Develop an individualized exercise prescription for aerobic and resistive training based on initial evaluation findings, risk stratification, comorbidities and participant's personal goals.       Expected Outcomes  Short Term: Increase workloads from initial exercise prescription for resistance, speed, and METs.;Short Term: Perform resistance training exercises routinely during rehab and add in resistance training at home;Long Term: Improve cardiorespiratory fitness, muscular endurance and strength as measured by increased METs and functional capacity (6MWT)       Able to understand and use rate of perceived exertion (RPE) scale  Yes       Intervention  Provide education and explanation on how to use RPE scale       Expected Outcomes  Short Term: Able to use RPE daily in rehab to express subjective intensity level;Long Term:  Able to use RPE to guide intensity level when exercising independently       Knowledge and understanding of Target Heart Rate Range (THRR)  Yes       Intervention  Provide education and explanation of THRR including how the numbers were predicted and where they are located for reference       Expected Outcomes  Short Term: Able to state/look up THRR;Long Term: Able to use THRR to govern intensity when exercising independently;Short Term: Able to use daily as guideline for intensity in rehab       Able to check pulse independently  Yes       Intervention  Provide education and demonstration on how to check pulse in carotid and radial arteries.;Review  the importance of being able to check your own pulse for safety during independent exercise       Expected Outcomes  Short Term: Able to explain why pulse checking is important during independent exercise;Long Term: Able to check pulse independently and accurately       Understanding of Exercise Prescription  Yes       Intervention  Provide education, explanation,  and written materials on patient's individual exercise prescription       Expected Outcomes  Short Term: Able to explain program exercise prescription;Long Term: Able to explain home exercise prescription to exercise independently          Exercise Goals Re-Evaluation : Exercise Goals Re-Evaluation    Row Name 07/29/19 1025 08/22/19 1334 09/25/19 1432 10/16/19 0915 10/28/19 0916     Exercise Goal Re-Evaluation   Exercise Goals Review  Increase Physical Activity;Increase Strength and Stamina;Able to understand and use rate of perceived exertion (RPE) scale;Knowledge and understanding of Target Heart Rate Range (THRR);Understanding of Exercise Prescription  Increase Physical Activity;Increase Strength and Stamina;Able to understand and use rate of perceived exertion (RPE) scale;Knowledge and understanding of Target Heart Rate Range (THRR);Able to check pulse independently;Understanding of Exercise Prescription  Increase Physical Activity;Increase Strength and Stamina;Able to understand and use rate of perceived exertion (RPE) scale;Knowledge and understanding of Target Heart Rate Range (THRR);Able to check pulse independently;Understanding of Exercise Prescription  Increase Physical Activity;Increase Strength and Stamina;Able to understand and use rate of perceived exertion (RPE) scale;Knowledge and understanding of Target Heart Rate Range (THRR);Able to check pulse independently;Understanding of Exercise Prescription  Increase Physical Activity;Increase Strength and Stamina;Able to understand and use rate of perceived exertion (RPE) scale;Knowledge and understanding of Target Heart Rate Range (THRR);Able to check pulse independently;Understanding of Exercise Prescription   Comments  Pt first day of exercsie in CR program. Pt tolerated exercise Rx well. Pt understands THRR, RPE scale, and exercise Rx.  Pt is progressing well in the program and has a MET level of 3.1. Pt continues to increase workloads and  MET level gradually. Pt declined virutal CR program due to closure of department for [place].  Patient will be contacted to resume participation in the onsite cardiac rehab program after temporary closure due to COVID-19 pandemic.  Pt first day returning to exercise after closure for COVID19. Pt tolerated exercise well. Pt understands THRR, RPE scale, and exercise Rx.  Reviewed HEP with Pt. Pt is exercising at home by using his stationary bike for 30-45 minutes 2-4 days per week in addition to CR program. Spoke with Pt about THRR, RPE scale, end points of exercise, and weather precautions when exercising at home. Pt understands goals.   Expected Outcomes  Will continue to monitor and progress Pt as tolerated.  Pt will continue to exercise at home.  Patient will resume participation in the onsite cardaic rehab program.  Will continue to monitor and progress Pt as tolerated.  Will continue to monitor and progress Pt as tolerated.      Discharge Exercise Prescription (Final Exercise Prescription Changes): Exercise Prescription Changes - 10/28/19 1100      Response to Exercise   Blood Pressure (Admit)  132/64    Blood Pressure (Exercise)  160/80    Blood Pressure (Exit)  126/64    Heart Rate (Admit)  74 bpm    Heart Rate (Exercise)  90 bpm    Heart Rate (Exit)  70 bpm    Rating of Perceived Exertion (Exercise)  12    Symptoms  None    Duration  Continue with 30 min of aerobic exercise without signs/symptoms of physical distress.    Intensity  THRR unchanged      Progression   Progression  Continue to progress workloads to maintain intensity without signs/symptoms of physical distress.    Average METs  2.7      Resistance Training   Training Prescription  Yes    Weight  5 lbs.     Reps  10-15    Time  10 Minutes      Interval Training   Interval Training  No      Treadmill   MPH  2    Grade  0    Minutes  15    METs  2.53      NuStep   Level  4    SPM  85    Minutes  15    METs   3      Home Exercise Plan   Plans to continue exercise at  Home (comment)    Frequency  Add 3 additional days to program exercise sessions.    Initial Home Exercises Provided  10/28/19       Nutrition:  Target Goals: Understanding of nutrition guidelines, daily intake of sodium <1530m, cholesterol <2026m calories 30% from fat and 7% or less from saturated fats, daily to have 5 or more servings of fruits and vegetables.  Biometrics: Pre Biometrics - 07/25/19 1153      Pre Biometrics   Height  5' 10.75" (1.797 m)    Weight  82.7 kg    Waist Circumference  38 inches    Hip Circumference  39.5 inches    Waist to Hip Ratio  0.96 %    BMI (Calculated)  25.61    Triceps Skinfold  18 mm    % Body Fat  26.2 %    Grip Strength  37 kg    Flexibility  13.25 in    Single Leg Stand  14.12 seconds        Nutrition Therapy Plan and Nutrition Goals: Nutrition Therapy & Goals - 08/02/19 1040      Nutrition Therapy   Diet  Heart Healthy/Carbohydrate modified    Drug/Food Interactions  Statins/Certain Fruits      Personal Nutrition Goals   Nutrition Goal  Pt able to name foods that affect blood glucose    Personal Goal #2  Pt to build a healthy plate including vegetables, fruits, whole grains, and low-fat dairy products in a heart healthy meal plan.    Personal Goal #3  CBG concentrations in the normal range or as close to normal as is safely possible      Intervention Plan   Intervention  Nutrition handout(s) given to patient.;Prescribe, educate and counsel regarding individualized specific dietary modifications aiming towards targeted core components such as weight, hypertension, lipid management, diabetes, heart failure and other comorbidities.    Expected Outcomes  Short Term Goal: Understand basic principles of dietary content, such as calories, fat, sodium, cholesterol and nutrients.;Short Term Goal: A plan has been developed with personal nutrition goals set during dietitian  appointment.;Long Term Goal: Adherence to prescribed nutrition plan.       Nutrition Assessments: Nutrition Assessments - 08/02/19 1040      MEDFICTS Scores   Pre Score  103       Nutrition Goals Re-Evaluation: Nutrition Goals Re-Evaluation    Row Name 08/02/19 1044 10/18/19 [id number]  Goals   Current Weight  182 lb (82.6 kg)  187 lb (84.8 kg)      Nutrition Goal  Pt able to name foods that affect blood glucose  Pt able to name foods that affect blood glucose      Comment  --  Stephen Jennings has eliminated sugary desserts, fried foods, and sugary vegetables. Eating Carbohydrates in moderation.      Expected Outcome  --  Pt to make diet changes that exhibit good knowledge of how diet impacts heart health and diabetes        Personal Goal #2 Re-Evaluation   Personal Goal #2  Pt to build a healthy plate including vegetables, fruits, whole grains, and low-fat dairy products in a heart healthy meal plan.  Pt to build a healthy plate including vegetables, fruits, whole grains, and low-fat dairy products in a heart healthy meal plan.        Personal Goal #3 Re-Evaluation   Personal Goal #3  CBG concentrations in the normal range or as close to normal as is safely possible  CBG concentrations in the normal range or as close to normal as is safely possible         Nutrition Goals Re-Evaluation: Nutrition Goals Re-Evaluation    Row Name 08/02/19 1044 10/18/19 9326           Goals   Current Weight  182 lb (82.6 kg)  187 lb (84.8 kg)      Nutrition Goal  Pt able to name foods that affect blood glucose  Pt able to name foods that affect blood glucose      Comment  --  Stephen Jennings has eliminated sugary desserts, fried foods, and sugary vegetables. Eating Carbohydrates in moderation.      Expected Outcome  --  Pt to make diet changes that exhibit good knowledge of how diet impacts heart health and diabetes        Personal Goal #2 Re-Evaluation   Personal Goal #2  Pt to build a healthy plate  including vegetables, fruits, whole grains, and low-fat dairy products in a heart healthy meal plan.  Pt to build a healthy plate including vegetables, fruits, whole grains, and low-fat dairy products in a heart healthy meal plan.        Personal Goal #3 Re-Evaluation   Personal Goal #3  CBG concentrations in the normal range or as close to normal as is safely possible  CBG concentrations in the normal range or as close to normal as is safely possible         Nutrition Goals Discharge (Final Nutrition Goals Re-Evaluation): Nutrition Goals Re-Evaluation - 10/18/19 7124      Goals   Current Weight  187 lb (84.8 kg)    Nutrition Goal  Pt able to name foods that affect blood glucose    Comment  Stephen Jennings has eliminated sugary desserts, fried foods, and sugary vegetables. Eating Carbohydrates in moderation.    Expected Outcome  Pt to make diet changes that exhibit good knowledge of how diet impacts heart health and diabetes      Personal Goal #2 Re-Evaluation   Personal Goal #2  Pt to build a healthy plate including vegetables, fruits, whole grains, and low-fat dairy products in a heart healthy meal plan.      Personal Goal #3 Re-Evaluation   Personal Goal #3  CBG concentrations in the normal range or as close to normal as is safely possible       Psychosocial:  Target Goals: Acknowledge presence or absence of significant depression and/or stress, maximize coping skills, provide positive support system. Participant is able to verbalize types and ability to use techniques and skills needed for reducing stress and depression.  Initial Review & Psychosocial Screening: Initial Psych Review & Screening - 07/25/19 1003      Initial Review   Current issues with  Current Abuse or Neglect to Report;Current Stress Concerns    Source of Stress Concerns  Chronic Illness;Occupation;Financial    Comments  Stephen Jennings has stress related to his CV event and lack of occupation.      Family Dynamics   Good Support  System?  Yes   Pt states his wife is a source of support.     Barriers   Psychosocial barriers to participate in program  The patient should benefit from training in stress management and relaxation.      Screening Interventions   Interventions  Encouraged to exercise       Quality of Life Scores: Quality of Life - 07/25/19 0958      Quality of Life   Select  Quality of Life      Quality of Life Scores   Health/Function Pre  24.63 %    Socioeconomic Pre  17.81 %    Psych/Spiritual Pre  29.14 %    Family Pre  27.6 %    GLOBAL Pre  24.4 %      Scores of 19 and below usually indicate a poorer quality of life in these areas.  A difference of  2-3 points is a clinically meaningful difference.  A difference of 2-3 points in the total score of the Quality of Life Index has been associated with significant improvement in overall quality of life, self-image, physical symptoms, and general health in studies assessing change in quality of life.  PHQ-9: Recent Review Flowsheet Data    Depression screen [facility] 2/9 10/28/2019 07/25/2019 05/08/2019   Decreased Interest 0 0 0   Down, Depressed, Hopeless 1 0 0   PHQ - 2 Score 1 0 0   Altered sleeping - - 0   Tired, decreased energy - - 0   Change in appetite - - 0   Feeling bad or failure about yourself  - - 0   Trouble concentrating - - 0   Moving slowly or fidgety/restless - - 0   Suicidal thoughts - - 0   PHQ-9 Score - - 0     Interpretation of Total Score  Total Score Depression Severity:  1-4 = Minimal depression, 5-9 = Mild depression, 10-14 = Moderate depression, 15-19 = Moderately severe depression, 20-27 = Severe depression   Psychosocial Evaluation and Intervention:   Psychosocial Re-Evaluation: Psychosocial Re-Evaluation    Row Name 08/01/19 1506 08/15/19 1023 10/16/19 1704         Psychosocial Re-Evaluation   Current issues with  Current Stress Concerns  Current Stress Concerns  Current Anxiety/Panic;Current  Depression;Current Stress Concerns     Comments  Will continue to  Will continue to monitor. Stephen Jennings has Jennings voiced any increased stressors.  Stephen Jennings resumed exercise today and reported some anxiety and depression related to his CV event.  He is unable to sleep for fear of Jennings waking up.  Counseling services offered to patient.  He states he will reach out to PCP.  PCP notified of conversation.     Expected Outcomes  --  --  Stephen Jennings will be able to obtain counseling services to help him  manage his anxiety and depression.     Interventions  Encouraged to attend Cardiac Rehabilitation for the exercise;Stress management education  Encouraged to attend Cardiac Rehabilitation for the exercise;Stress management education  Encouraged to attend Cardiac Rehabilitation for the exercise;Stress management education;Relaxation education;Physician referral     Continue Psychosocial Services   Follow up required by staff  Follow up required by staff  Follow up required by counselor     Comments  Stephen Jennings has stress related to his CV event and lack of occupation.  Stephen Jennings has stress related to his CV event and lack of occupation.  --       Initial Review   Source of Stress Concerns  Financial  Financial  Chronic Illness        Psychosocial Discharge (Final Psychosocial Re-Evaluation): Psychosocial Re-Evaluation - 10/16/19 1704      Psychosocial Re-Evaluation   Current issues with  Current Anxiety/Panic;Current Depression;Current Stress Concerns    Comments  Stephen Jennings resumed exercise today and reported some anxiety and depression related to his CV event.  He is unable to sleep for fear of Jennings waking up.  Counseling services offered to patient.  He states he will reach out to PCP.  PCP notified of conversation.    Expected Outcomes  Stephen Jennings will be able to obtain counseling services to help him manage his anxiety and depression.    Interventions  Encouraged to attend Cardiac Rehabilitation for the exercise;Stress management  education;Relaxation education;Physician referral    Continue Psychosocial Services   Follow up required by counselor      Initial Review   Source of Stress Concerns  Chronic Illness       Vocational Rehabilitation: Provide vocational rehab assistance to qualifying candidates.   Vocational Rehab Evaluation & Intervention: Vocational Rehab - 07/25/19 1239      Initial Vocational Rehab Evaluation & Intervention   Assessment shows need for Vocational Rehabilitation  --   Offered vocational rehab packet to the patient. Declined at this time may ask later      Education: Education Goals: Education classes will be provided on a weekly basis, covering required topics. Participant will state understanding/return demonstration of topics presented.  Learning Barriers/Preferences: Learning Barriers/Preferences - 07/25/19 1154      Learning Barriers/Preferences   Learning Barriers  Sight    Learning Preferences  Video;Skilled Demonstration;Verbal Instruction;Audio;Pictoral       Education Topics: Count Your Pulse:  -Group instruction provided by verbal instruction, demonstration, patient participation and written materials to support subject.  Instructors address importance of being able to find your pulse and how to count your pulse when at home without a heart monitor.  Patients get hands on experience counting their pulse with staff help and individually.   Heart Attack, Angina, and Risk Factor Modification:  -Group instruction provided by verbal instruction, video, and written materials to support subject.  Instructors address signs and symptoms of angina and heart attacks.    Also discuss risk factors for heart disease and how to make changes to improve heart health risk factors.   Functional Fitness:  -Group instruction provided by verbal instruction, demonstration, patient participation, and written materials to support subject.  Instructors address safety measures for doing  things around the house.  Discuss how to get up and down off the floor, how to pick things up properly, how to safely get out of a chair without assistance, and balance training.   Meditation and Mindfulness:  -Group instruction provided by verbal instruction, patient participation, and  written materials to support subject.  Instructor addresses importance of mindfulness and meditation practice to help reduce stress and improve awareness.  Instructor also leads participants through a meditation exercise.    Stretching for Flexibility and Mobility:  -Group instruction provided by verbal instruction, patient participation, and written materials to support subject.  Instructors lead participants through series of stretches that are designed to increase flexibility thus improving mobility.  These stretches are additional exercise for major muscle groups that are typically performed during regular warm up and cool down.   Hands Only CPR:  -Group verbal, video, and participation provides a basic overview of AHA guidelines for community CPR. Role-play of emergencies allow participants the opportunity to practice calling for help and chest compression technique with discussion of AED use.   Hypertension: -Group verbal and written instruction that provides a basic overview of hypertension including the most recent diagnostic guidelines, risk factor reduction with self-care instructions and medication management.    Nutrition I class: Heart Healthy Eating:  -Group instruction provided by PowerPoint slides, verbal discussion, and written materials to support subject matter. The instructor gives an explanation and review of the Therapeutic Lifestyle Changes diet recommendations, which includes a discussion on lipid goals, dietary fat, sodium, fiber, plant stanol/sterol esters, sugar, and the components of a well-balanced, healthy diet.   Nutrition II class: Lifestyle Skills:  -Group instruction provided  by PowerPoint slides, verbal discussion, and written materials to support subject matter. The instructor gives an explanation and review of label reading, grocery shopping for heart health, heart healthy recipe modifications, and ways to make healthier choices when eating out.   Diabetes Question & Answer:  -Group instruction provided by PowerPoint slides, verbal discussion, and written materials to support subject matter. The instructor gives an explanation and review of diabetes co-morbidities, pre- and post-prandial blood glucose goals, pre-exercise blood glucose goals, signs, symptoms, and treatment of hypoglycemia and hyperglycemia, and foot care basics.   Diabetes Blitz:  -Group instruction provided by PowerPoint slides, verbal discussion, and written materials to support subject matter. The instructor gives an explanation and review of the physiology behind type 1 and type 2 diabetes, diabetes medications and rational behind using different medications, pre- and post-prandial blood glucose recommendations and Hemoglobin A1c goals, diabetes diet, and exercise including blood glucose guidelines for exercising safely.    Portion Distortion:  -Group instruction provided by PowerPoint slides, verbal discussion, written materials, and food models to support subject matter. The instructor gives an explanation of serving size versus portion size, changes in portions sizes over the last 20 years, and what consists of a serving from each food group.   Stress Management:  -Group instruction provided by verbal instruction, video, and written materials to support subject matter.  Instructors review role of stress in heart disease and how to cope with stress positively.     Exercising on Your Own:  -Group instruction provided by verbal instruction, power point, and written materials to support subject.  Instructors discuss benefits of exercise, components of exercise, frequency and intensity of  exercise, and end points for exercise.  Also discuss use of nitroglycerin and activating EMS.  Review options of places to exercise outside of rehab.  Review guidelines for sex with heart disease.   Cardiac Drugs I:  -Group instruction provided by verbal instruction and written materials to support subject.  Instructor reviews cardiac drug classes: antiplatelets, anticoagulants, beta blockers, and statins.  Instructor discusses reasons, side effects, and lifestyle considerations for each drug class.  Cardiac Drugs II:  -Group instruction provided by verbal instruction and written materials to support subject.  Instructor reviews cardiac drug classes: angiotensin converting enzyme inhibitors (ACE-I), angiotensin II receptor blockers (ARBs), nitrates, and calcium channel blockers.  Instructor discusses reasons, side effects, and lifestyle considerations for each drug class.   Anatomy and Physiology of the Circulatory System:  Group verbal and written instruction and models provide basic cardiac anatomy and physiology, with the coronary electrical and arterial systems. Review of: AMI, Angina, Valve disease, Heart Failure, Peripheral Artery Disease, Cardiac Arrhythmia, Pacemakers, and the ICD.   Other Education:  -Group or individual verbal, written, or video instructions that support the educational goals of the cardiac rehab program.   Holiday Eating Survival Tips:  -Group instruction provided by PowerPoint slides, verbal discussion, and written materials to support subject matter. The instructor gives patients tips, tricks, and techniques to help them Jennings only survive but enjoy the holidays despite the onslaught of food that accompanies the holidays.   Knowledge Questionnaire Score: Knowledge Questionnaire Score - 07/25/19 1012      Knowledge Questionnaire Score   Pre Score  21/28       Core Components/Risk Factors/Patient Goals at Admission: Personal Goals and Risk Factors at  Admission - 07/25/19 1241      Core Components/Risk Factors/Patient Goals on Admission    Weight Management  Yes;Weight Maintenance    Intervention  Weight Management: Develop a combined nutrition and exercise program designed to reach desired caloric intake, while maintaining appropriate intake of nutrient and fiber, sodium and fats, and appropriate energy expenditure required for the weight goal.;Weight Management: Provide education and appropriate resources to help participant work on and attain dietary goals.    Admit Weight  182 lb 5.1 oz (82.7 kg)    Expected Outcomes  Short Term: Continue to assess and modify interventions until short term weight is achieved;Long Term: Adherence to nutrition and physical activity/exercise program aimed toward attainment of established weight goal;Weight Maintenance: Understanding of the daily nutrition guidelines, which includes 25-35% calories from fat, 7% or less cal from saturated fats, less than 25m cholesterol, less than 1.5gm of sodium, & 5 or more servings of fruits and vegetables daily;Understanding recommendations for meals to include 15-35% energy as protein, 25-35% energy from fat, 35-60% energy from carbohydrates, less than 2044mof dietary cholesterol, 20-35 gm of total fiber daily;Understanding of distribution of calorie intake throughout the day with the consumption of 4-5 meals/snacks    Tobacco Cessation  Yes   Patient quit on 04/08/19   Intervention  Offer self-teaching materials, assist with locating and accessing local/national Quit Smoking programs, and support quit date choice.    Expected Outcomes  Long Term: Complete abstinence from all tobacco products for at least 12 months from quit date.    Diabetes  Yes    Intervention  Provide education about signs/symptoms and action to take for hypo/hyperglycemia.;Provide education about proper nutrition, including hydration, and aerobic/resistive exercise prescription along with prescribed  medications to achieve blood glucose in normal ranges: Fasting glucose 65-99 mg/dL    Expected Outcomes  Short Term: Participant verbalizes understanding of the signs/symptoms and immediate care of hyper/hypoglycemia, proper foot care and importance of medication, aerobic/resistive exercise and nutrition plan for blood glucose control.;Long Term: Attainment of HbA1C < 7%.    Heart Failure  Yes    Intervention  --   Patient will monitor weight, follow a low sodium heart healthy diet and exercise at cardiac rehab and at home  Expected Outcomes  Short term: Attendance in program 2-3 days a week with increased exercise capacity. Reported lower sodium intake. Reported increased fruit and vegetable intake. Reports medication compliance.;Short term: Daily weights obtained and reported for increase. Utilizing diuretic protocols set by physician.;Long term: Adoption of self-care skills and reduction of barriers for early signs and symptoms recognition and intervention leading to self-care maintenance.    Lipids  Yes    Intervention  Provide education and support for participant on nutrition & aerobic/resistive exercise along with prescribed medications to achieve LDL <23m, HDL >486m    Expected Outcomes  Short Term: Participant states understanding of desired cholesterol values and is compliant with medications prescribed. Participant is following exercise prescription and nutrition guidelines.;Long Term: Cholesterol controlled with medications as prescribed, with individualized exercise RX and with personalized nutrition plan. Value goals: LDL < 7055mHDL > 40 mg.    Stress  Yes    Intervention  Offer individual and/or small group education and counseling on adjustment to heart disease, stress management and health-related lifestyle change. Teach and support self-help strategies.;Refer participants experiencing significant psychosocial distress to appropriate mental health specialists for further evaluation and  treatment. When possible, include family members and significant others in education/counseling sessions.    Expected Outcomes  Short Term: Participant demonstrates changes in health-related behavior, relaxation and other stress management skills, ability to obtain effective social support, and compliance with psychotropic medications if prescribed.;Long Term: Emotional wellbeing is indicated by absence of clinically significant psychosocial distress or social isolation.       Core Components/Risk Factors/Patient Goals Review:  Goals and Risk Factor Review    Row Name 07/30/19 1435 07/30/19 1436 08/15/19 1024 08/26/19 1128 09/25/19 1537     Core Components/Risk Factors/Patient Goals Review   Personal Goals Review  Weight Management/Obesity;Lipids;Tobacco Cessation;Diabetes;Hypertension;Stress  Weight Management/Obesity;Lipids;Tobacco Cessation;Diabetes;Hypertension;Stress  Weight Management/Obesity;Lipids;Tobacco Cessation;Diabetes;Hypertension;Stress  Weight Management/Obesity;Lipids;Tobacco Cessation;Diabetes;Hypertension;Stress  Weight Management/Obesity;Lipids;Tobacco Cessation;Diabetes;Hypertension;Stress   Review  --  Stephen Jennings exercise on 07/29/19 and exercised without difficulty.  Stephen Jennings done well with exercise. Stephen Jennings vital signs have been stable.  BarSigfredos done well with exercise. Stephen Jennings vital signs have been stable.  Cardiac rehab has been on hold due to the covid 19 pandemic and re assignment of staff   Expected Outcomes  --  Stephen Jennings continue to particpate in phase 2 cardiac rehab for exercise, nutrition and lifestyle modifications.  Stephen Jennings continue to particpate in phase 2 cardiac rehab for exercise, nutrition and lifestyle modifications.  Cardiac rehab is currently on hold due to [place] pandemic. Stephen Jennings interested in virtual cardiac rehab at this time but would like to resume phase 2 cardiac rehab when the program reopens.  cardiac rehab is resuming in person rehab  on 10/07/19   Row Name 10/28/19 1639             Core Components/Risk Factors/Patient Goals Review   Personal Goals Review  Weight Management/Obesity;Lipids;Tobacco Cessation;Diabetes;Hypertension;Stress       Review  Pt with multiple CAD RFs willing to participate in CR exercise.  BarRahiem tolerating exercise well.  He has started walking on the treadmill and is tolerating it well.       Expected Outcomes  BarNavyll continue to participate in CR exercise, nutrition, and lifestyle modification opportunities to reduce his risk of CV disease.          Core Components/Risk Factors/Patient Goals at Discharge (Final Review):  Goals and Risk Factor Review - 10/28/19 1639  Core Components/Risk Factors/Patient Goals Review   Personal Goals Review  Weight Management/Obesity;Lipids;Tobacco Cessation;Diabetes;Hypertension;Stress    Review  Pt with multiple CAD RFs willing to participate in CR exercise.  Clester is tolerating exercise well.  He has started walking on the treadmill and is tolerating it well.    Expected Outcomes  Theotis will continue to participate in CR exercise, nutrition, and lifestyle modification opportunities to reduce his risk of CV disease.       ITP Comments: ITP Comments    Row Name 07/25/19 1223 07/30/19 1433 08/15/19 1021 09/25/19 1535 10/16/19 1704   ITP Comments  Dr Fransico Him MD, Medical Director  30 Day ITP Review. Patient started exercise on 07/29/19 and exercised without difficulty.  30 Day ITP Review. Patient is with good participation and attendance in phase 2 cardiac rehab. Isaiyah's CBG's have been stable.  30 Day ITP Review. Phase 2 cardiac rehab is resuming in person rehab on 10/07/19 for exercise.  Baryy resumed exercise today.  He tolerated it well.   [place] Name 10/28/19 1630           ITP Comments  30 Day ITP Review.  Eoghan is tolerating exercise well.  He has started walking on the treadmill and is tolerating it well.  He mentioned some concerns with  anxiety and his PCP was relayed this information.          Comments: See ITP Comments.

## 2019-11-01 ENCOUNTER — Other Ambulatory Visit: Payer: Self-pay

## 2019-11-01 ENCOUNTER — Encounter (HOSPITAL_COMMUNITY)
Admission: RE | Admit: 2019-11-01 | Discharge: 2019-11-01 | Disposition: A | Discharge status: Home / Self Care | Payer: Medicaid Other | Source: Ambulatory Visit | Attending: Cardiovascular Disease | Admitting: Cardiovascular Disease

## 2019-11-01 VITALS — Ht 70.75 in | Wt 183.0 lb

## 2019-11-01 DIAGNOSIS — Z955 Presence of coronary angioplasty implant and graft: Secondary | ICD-10-CM | POA: Diagnosis not present

## 2019-11-01 DIAGNOSIS — I2102 ST elevation (STEMI) myocardial infarction involving left anterior descending coronary artery: Secondary | ICD-10-CM | POA: Diagnosis not present

## 2019-11-04 ENCOUNTER — Encounter (HOSPITAL_COMMUNITY)
Admission: RE | Admit: 2019-11-04 | Discharge: 2019-11-04 | Disposition: A | Discharge status: Home / Self Care | Payer: Medicaid Other | Source: Ambulatory Visit | Attending: Cardiovascular Disease | Admitting: Cardiovascular Disease

## 2019-11-04 ENCOUNTER — Other Ambulatory Visit: Payer: Self-pay

## 2019-11-04 DIAGNOSIS — I2102 ST elevation (STEMI) myocardial infarction involving left anterior descending coronary artery: Secondary | ICD-10-CM | POA: Diagnosis not present

## 2019-11-04 DIAGNOSIS — Z955 Presence of coronary angioplasty implant and graft: Secondary | ICD-10-CM

## 2019-11-06 ENCOUNTER — Encounter (HOSPITAL_COMMUNITY)
Admission: RE | Admit: 2019-11-06 | Discharge: 2019-11-06 | Disposition: A | Discharge status: Home / Self Care | Payer: Medicaid Other | Source: Ambulatory Visit | Attending: Cardiovascular Disease | Admitting: Cardiovascular Disease

## 2019-11-06 ENCOUNTER — Other Ambulatory Visit: Payer: Self-pay | Admitting: Pharmacist

## 2019-11-06 ENCOUNTER — Encounter (HOSPITAL_COMMUNITY): Payer: Self-pay

## 2019-11-06 ENCOUNTER — Other Ambulatory Visit: Payer: Self-pay

## 2019-11-06 DIAGNOSIS — I2102 ST elevation (STEMI) myocardial infarction involving left anterior descending coronary artery: Secondary | ICD-10-CM

## 2019-11-06 DIAGNOSIS — Z955 Presence of coronary angioplasty implant and graft: Secondary | ICD-10-CM | POA: Diagnosis not present

## 2019-11-06 MED ORDER — ACCU-CHEK GUIDE ME W/DEVICE KIT: 1.0000 | PACK | Freq: Two times a day (BID) | 0 refills | Status: DC

## 2019-11-06 MED ORDER — ACCU-CHEK GUIDE VI STRP: ORAL_STRIP | 6 refills | Status: DC

## 2019-11-06 MED ORDER — ACCU-CHEK SOFTCLIX LANCETS MISC: 6 refills | Status: DC

## 2019-11-12 ENCOUNTER — Ambulatory Visit: Payer: Medicaid Other | Admitting: Neurology

## 2019-11-12 ENCOUNTER — Other Ambulatory Visit: Payer: Self-pay

## 2019-11-12 ENCOUNTER — Encounter: Payer: Self-pay | Admitting: Neurology

## 2019-11-12 VITALS — BP 138/64 | HR 64 | Temp 97.4°F | Ht 68.0 in | Wt 185.0 lb

## 2019-11-12 DIAGNOSIS — R519 Headache, unspecified: Secondary | ICD-10-CM | POA: Diagnosis not present

## 2019-11-12 DIAGNOSIS — R0683 Snoring: Secondary | ICD-10-CM

## 2019-11-12 NOTE — Progress Notes (Signed)
[id number]    Provider:  Dr Jaynee Eagles Requesting Provider: Ladell Pier, MD Primary Care Provider:  Ladell Pier, MD  CC:  Morning headaches  HPI:  Stephen Jennings is a 64 y.o. male here as requested by Ladell Pier, MD for chronic daily headache. PMHx Cad(NSTEMI with stent LAD 03/2019), ICM last EF 50%, former smoker, COPD, pre-DM, anemia, hyperlipidemia.  I reviewed Dr. Durenda Age notes, complaining of daily headache for few months, all over the head, morning headaches about 3 times a week, unclear if he snores, no nausea vomiting, photophobia or phonophobia, blurred vision sometimes, not sleeping well afraid that he may not wake up.  No previous history of headaches.  Last hemoglobin A1c was 5.8.  I also reviewed examination which was normal.  She was referred for headaches, but also a suspicion for obstructive sleep apnea.  When he had the heart attack, he spent 20 days in the hospital and started having a headache. It has followed the same pattern. He wakes up with headaches every day, the headache are in the frontal area, wakes up with dry mouth, he snores heavily per wife, not tired during the day. The headache lasts until he takes a tylenol. Daily morning headache for 6-7 months. He wakes 1x a night to urinate. The headaches are mild and all day long. Bilateral in the forehead. No vision changes. No light or sound sensitivity. No focal weakness. No other focal neurologic deficits, associated symptoms, inciting events or modifiable factors. He has never had a sleep test before.   Reviewed notes, labs and imaging from outside physicians, which showed:  CT head 10/14/2019: showed No acute intracranial abnormalities including mass lesion or mass effect, hydrocephalus, extra-axial fluid collection, midline shift, hemorrhage, or acute infarction, large ischemic events (personally reviewed images)  tsh normal 0.568 10/18/2019   Review of Systems: Patient complains  of symptoms per HPI as well as the following symptoms: headache, cough. Pertinent negatives and positives per HPI. All others negative.   Social History   Socioeconomic History  . Marital status: Married    Spouse name: Not on file  . Number of children: 1  . Years of education: 8  . Highest education level: 8th grade  Occupational History  . Not on file  Tobacco Use  . Smoking status: Former Smoker    Quit date: 04/08/2019    Years since quitting: 0.5  . Smokeless tobacco: Never Used  Substance and Sexual Activity  . Alcohol use: No  . Drug use: No  . Sexual activity: Not on file  Other Topics Concern  . Not on file  Social History Narrative   Lives with wife   Right handed   Caffeine: about 2 cups of coffee every morning, no soda   Social Determinants of Health   Financial Resource Strain: High Risk  . Difficulty of Paying Living Expenses: Hard  Food Insecurity:   . Worried About Charity fundraiser in the Last Year:   . Arboriculturist in the Last Year:   Transportation Needs: No Transportation Needs  . Lack of Transportation (Medical): No  . Lack of Transportation (Non-Medical): No  Physical Activity: Insufficiently Active  . Days of Exercise per Week: 4 days  . Minutes of Exercise per Session: 30 min  Stress: Stress Concern Present  . Feeling of Stress : Rather much  Social Connections:   . Frequency of Communication with Friends and Family:   . Frequency of Social Gatherings  with Friends and Family:   . Attends Religious Services:   . Active Member of Clubs or Organizations:   . Attends Archivist Meetings:   Marland Kitchen Marital Status:   Intimate Partner Violence:   . Fear of Current or Ex-Partner:   . Emotionally Abused:   Marland Kitchen Physically Abused:   . Sexually Abused:     Family History  Problem Relation Age of Onset  . Hypertension Mother   . Heart failure Mother   . Headache Neg Hx   . Migraines Neg Hx     Past Medical History:  Diagnosis Date    . Cardiogenic shock ([place])   . CHF (congestive heart failure) ([place])   . Coronary artery disease   . Diabetes mellitus without complication ([place])   . Hyperlipidemia   . Medical history non-contributory     Patient Active Problem List   Diagnosis Date Noted  . Chronic daily headache 10/08/2019  . History of ST elevation myocardial infarction (STEMI) 05/08/2019  . Ischemic cardiomyopathy 05/08/2019  . 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 History of placement of stent in LAD coronary artery 05/08/2019    Past Surgical History:  Procedure Laterality Date  . CARDIAC CATHETERIZATION    . CORONARY/GRAFT ACUTE MI REVASCULARIZATION N/A 03/31/2019   Procedure: Coronary/Graft Acute MI Revascularization;  Surgeon: Troy Sine, MD;  Location: [facility];  Service: Cardiovascular;  Laterality: N/A;  . ICD IMPLANT N/A 04/15/2019   Procedure: ICD IMPLANT;  Surgeon: Constance Haw, MD;  Location: [facility];  Service: Cardiovascular;  Laterality: N/A;  . LEFT HEART CATH AND CORONARY ANGIOGRAPHY N/A 03/31/2019   Procedure: LEFT HEART CATH AND CORONARY ANGIOGRAPHY;  Surgeon: Troy Sine, MD;  Location: [facility];  Service: Cardiovascular;  Laterality: N/A;  . NO PAST SURGERIES    . RIGHT HEART CATH N/A 03/31/2019   Procedure: RIGHT HEART CATH;  Surgeon: Troy Sine, MD;  Location: [facility];  Service: Cardiovascular;  Laterality: N/A;  . VENTRICULAR ASSIST DEVICE INSERTION N/A 03/31/2019   Procedure: VENTRICULAR ASSIST DEVICE INSERTION;  Surgeon: Troy Sine, MD;  Location: [facility];  Service: Cardiovascular;  Laterality: N/A;    Current Outpatient Medications  Medication Sig Dispense Refill  . Accu-Chek Softclix Lancets lancets Use to measure blood sugar twice a day 100 each 6  . aspirin 81 MG chewable tablet CHEW 1 TABLET BY MOUTH DAILY. 30 tablet 2  . atorvastatin (LIPITOR) 80 MG tablet TAKE 1 TABLET BY  MOUTH DAILY AT 6 PM. 30 tablet 2  . Blood Glucose Monitoring Suppl (ACCU-CHEK GUIDE ME) w/Device KIT 1 kit by Does not apply route in the morning and at bedtime. Use to measure blood sugar twice a day 1 kit 0  . empagliflozin (JARDIANCE) 10 MG TABS tablet Take 10 mg by mouth daily before breakfast. 30 tablet 11  . eplerenone (INSPRA) 25 MG tablet Take 1 tablet (25 mg total) by mouth daily. 30 tablet 11  . glucose blood (ACCU-CHEK GUIDE) test strip Use to measure blood sugar twice a day 100 each 6  . Hyprom-Naphaz-Polysorb-Zn Sulf (CLEAR EYES COMPLETE) SOLN Apply 1 drop to eye 3 (three) times daily. 15 mL 1  . losartan (COZAAR) 50 MG tablet Take 25 mg by mouth daily.    . metoprolol succinate (TOPROL-XL) 25 MG 24 hr tablet 1/2 tab PO daily 45 tablet 3  . nitroGLYCERIN (NITROSTAT) 0.4 MG  SL tablet Place 1 tablet (0.4 mg total) under the tongue every 5 (five) minutes x 3 doses as needed for chest pain. 25 tablet 12  . ticagrelor (BRILINTA) 90 MG TABS tablet Take 1 tablet (90 mg total) by mouth 2 (two) times daily. 60 tablet 2  . traZODone (DESYREL) 50 MG tablet 1/2 to 1 tab PO QHS 30 tablet 2   No current facility-administered medications for this visit.    Allergies as of 11/12/2019 - Review Complete 11/12/2019  Allergen Reaction Noted  . Spironolactone  08/12/2019  . Metformin and related Anxiety and Palpitations 09/23/2019    Vitals: BP 138/64 (BP Location: Right Arm, Patient Position: Sitting)   Pulse 64   Temp (!) 97.4 F (36.3 C) Comment: taken at front  Ht _0  (1.727 m)   Wt 185 lb (83.9 kg)   BMI 28.13 kg/m  Last Weight:  Wt Readings from Last 1 Encounters:  11/12/19 185 lb (83.9 kg)   Last Height:   Ht Readings from Last 1 Encounters:  11/12/19 _1  (1.727 m)     Physical exam: Exam: Gen: NAD, poor dentition               CV: RRR, no MRG. No Carotid Bruits. No peripheral edema, warm, nontender Eyes: Conjunctivae clear without exudates or  hemorrhage  Neuro: Detailed Neurologic Exam  Speech:    Speech is normal; fluent and spontaneous with normal comprehension.  Cognition:    The patient is oriented to person, place, and time;     recent and remote memory intact;     language fluent;     normal attention, concentration,     fund of knowledge Cranial Nerves:    The pupils are equal, round, and reactive to light.attempted fundoscopy pupils too small.  Visual fields are full to finger confrontation. Extraocular movements are intact. Trigeminal sensation is intact and the muscles of mastication are normal. The face is symmetric. The palate elevates in the midline. Hearing intact. Voice is normal. Shoulder shrug is normal. The tongue has normal motion without fasciculations.   Coordination:    No dysmetria   Gait:    Normal native gait  Motor Observation:    No asymmetry, no atrophy, and no involuntary movements noted. Tone:    Normal muscle tone.    Posture:    Posture is normal. normal erect    Strength:    Strength is V/V in the upper and lower limbs.      Sensation: intact to LT     Reflex Exam:  DTR's: absent AJs otherwised eep tendon reflexes in the upper and lower extremities are normal to brisk bilaterally.   Toes:    The toes are downgoing bilaterally.   Clonus:    Clonus is absent.    Assessment/Plan:  64 y.o. male here as requested by Ladell Pier, MD for chronic daily headache. PMHx Cad(NSTEMI with stent LAD 03/2019), ICM last EF 50%, former smoker, COPD, pre-DM, anemia, hyperlipidemia.   Patient wakes up with headaches every morning, wakes up with dry mouth, snores heavily, dull headache no migrainous features, ESS 10, I do think this is likely sleep apnea or possibly hypoventilation or both (smoked for many years, appears to have a chronic cough).  I will order sleep evaluation.  Orders Placed This Encounter  Procedures  . Ambulatory referral to Sleep Studies    Cc: Ladell Pier,  MD,    Sarina Ill, MD  [facility]  [address] [place], [place] [address]  Phone [phone] Fax [phone]

## 2019-11-12 NOTE — Patient Instructions (Signed)
Sleep Apnea Sleep apnea is a condition in which breathing pauses or becomes shallow during sleep. Episodes of sleep apnea usually last 10 seconds or longer, and they may occur as many as 20 times an hour. Sleep apnea disrupts your sleep and keeps your body from getting the rest that it needs. This condition can increase your risk of certain health problems, including:  Heart attack.  Stroke.  Obesity.  Diabetes.  Heart failure.  Irregular heartbeat. What are the causes? There are three kinds of sleep apnea:  Obstructive sleep apnea. This kind is caused by a blocked or collapsed airway.  Central sleep apnea. This kind happens when the part of the brain that controls breathing does not send the correct signals to the muscles that control breathing.  Mixed sleep apnea. This is a combination of obstructive and central sleep apnea. The most common cause of this condition is a collapsed or blocked airway. An airway can collapse or become blocked if:  Your throat muscles are abnormally relaxed.  Your tongue and tonsils are larger than normal.  You are overweight.  Your airway is smaller than normal. What increases the risk? You are more likely to develop this condition if you:  Are overweight.  Smoke.  Have a smaller than normal airway.  Are elderly.  Are male.  Drink alcohol.  Take sedatives or tranquilizers.  Have a family history of sleep apnea. What are the signs or symptoms? Symptoms of this condition include:  Trouble staying asleep.  Daytime sleepiness and tiredness.  Irritability.  Loud snoring.  Morning headaches.  Trouble concentrating.  Forgetfulness.  Decreased interest in sex.  Unexplained sleepiness.  Mood swings.  Personality changes.  Feelings of depression.  Waking up often during the night to urinate.  Dry mouth.  Sore throat. How is this diagnosed? This condition may be diagnosed with:  A medical history.  A physical  exam.  A series of tests that are done while you are sleeping (sleep study). These tests are usually done in a sleep lab, but they may also be done at home. How is this treated? Treatment for this condition aims to restore normal breathing and to ease symptoms during sleep. It may involve managing health issues that can affect breathing, such as high blood pressure or obesity. Treatment may include:  Sleeping on your side.  Using a decongestant if you have nasal congestion.  Avoiding the use of depressants, including alcohol, sedatives, and narcotics.  Losing weight if you are overweight.  Making changes to your diet.  Quitting smoking.  Using a device to open your airway while you sleep, such as: ? An oral appliance. This is a custom-made mouthpiece that shifts your lower jaw forward. ? A continuous positive airway pressure (CPAP) device. This device blows air through a mask when you breathe out (exhale). ? A nasal expiratory positive airway pressure (EPAP) device. This device has valves that you put into each nostril. ? A bi-level positive airway pressure (BPAP) device. This device blows air through a mask when you breathe in (inhale) and breathe out (exhale).  Having surgery if other treatments do not work. During surgery, excess tissue is removed to create a wider airway. It is important to get treatment for sleep apnea. Without treatment, this condition can lead to:  High blood pressure.  Coronary artery disease.  In men, an inability to achieve or maintain an erection (impotence).  Reduced thinking abilities. Follow these instructions at home: Lifestyle  Make any lifestyle changes   that your health care provider recommends.  Eat a healthy, well-balanced diet.  Take steps to lose weight if you are overweight.  Avoid using depressants, including alcohol, sedatives, and narcotics.  Do not use any products that contain nicotine or tobacco, such as cigarettes,  e-cigarettes, and chewing tobacco. If you need help quitting, ask your health care provider. General instructions  Take over-the-counter and prescription medicines only as told by your health care provider.  If you were given a device to open your airway while you sleep, use it only as told by your health care provider.  If you are having surgery, make sure to tell your health care provider you have sleep apnea. You may need to bring your device with you.  Keep all follow-up visits as told by your health care provider. This is important. Contact a health care provider if:  The device that you received to open your airway during sleep is uncomfortable or does not seem to be working.  Your symptoms do not improve.  Your symptoms get worse. Get help right away if:  You develop: ? Chest pain. ? Shortness of breath. ? Discomfort in your back, arms, or stomach.  You have: ? Trouble speaking. ? Weakness on one side of your body. ? Drooping in your face. These symptoms may represent a serious problem that is an emergency. Do not wait to see if the symptoms will go away. Get medical help right away. Call your local emergency services (911 in the [place] Do not drive yourself to the hospital. Summary  Sleep apnea is a condition in which breathing pauses or becomes shallow during sleep.  The most common cause is a collapsed or blocked airway.  The goal of treatment is to restore normal breathing and to ease symptoms during sleep. This information is not intended to replace advice given to you by your health care provider. Make sure you discuss any questions you have with your health care provider. Document Revised: 01/23/2019 Document Reviewed: 04/03/2018 [employer] Patient Education  2020 [employer]  

## 2019-11-15 NOTE — Progress Notes (Signed)
Discharge Progress Report  Patient Details  Name: Stephen Jennings MRN: [id number] Date of Birth: May 26, 1956 Referring Provider:     [facility] from 07/25/2019 in [place]  Referring Provider  Dr. Shelva Majestic       Number of Visits: 20  Reason for Discharge:  Patient reached a stable level of exercise. Patient independent in their exercise. Patient has met program and personal goals.  Smoking History:  Social History   Tobacco Use  Smoking Status Former Smoker  . Quit date: 04/08/2019  . Years since quitting: 0.6  Smokeless Tobacco Never Used    Diagnosis:  ST elevation myocardial infarction involving left anterior descending (LAD) coronary artery ([facility]) 03/31/19  S/P coronary artery stent placement S/P DES LAD 03/31/19  ADL UCSD:   Initial Exercise Prescription: Initial Exercise Prescription - 07/25/19 1100      Date of Initial Exercise RX and Referring Provider   Date  07/25/19    Referring Provider  Dr. Shelva Majestic    Expected Discharge Date  09/20/19      Recumbant Bike   Level  2    Watts  35    Minutes  15    METs  3.38      NuStep   Level  3    SPM  85    Minutes  15    METs  3      Prescription Details   Frequency (times per week)  3    Duration  Progress to 30 minutes of continuous aerobic without signs/symptoms of physical distress      Intensity   THRR 40-80% of Max Heartrate  63-126    Ratings of Perceived Exertion  11-13      Progression   Progression  Continue to progress workloads to maintain intensity without signs/symptoms of physical distress.      Resistance Training   Training Prescription  Yes    Weight  3 lbs.     Reps  10-15       Discharge Exercise Prescription (Final Exercise Prescription Changes): Exercise Prescription Changes - 11/06/19 1400      Response to Exercise   Blood Pressure (Admit)  122/68    Blood Pressure (Exercise)  142/70    Blood Pressure (Exit)   130/70    Heart Rate (Admit)  70 bpm    Heart Rate (Exercise)  95 bpm    Heart Rate (Exit)  80 bpm    Rating of Perceived Exertion (Exercise)  12    Perceived Dyspnea (Exercise)  0    Symptoms  None    Comments  Pt graduated from Cardiac Rehab     Duration  Continue with 30 min of aerobic exercise without signs/symptoms of physical distress.    Intensity  THRR unchanged      Progression   Progression  Continue to progress workloads to maintain intensity without signs/symptoms of physical distress.    Average METs  3.69      Resistance Training   Training Prescription  No      Treadmill   MPH  2.3    Grade  1    Minutes  15    METs  3.08      NuStep   Level  5    SPM  95    Minutes  15    METs  4.3      Home Exercise Plan   Plans to continue exercise  at  Home (comment)   Walking & Biking   Frequency  Add 4 additional days to program exercise sessions.    Initial Home Exercises Provided  10/28/19       Functional Capacity: 6 Minute Walk    Row Name 07/25/19 1142 11/01/19 1151       6 Minute Walk   Phase  Initial  Discharge    Distance  1376 feet  1588 feet    Distance % Change  --  15.41 %    Distance Feet Change  --  212 ft    Walk Time  6 minutes  6 minutes    # of Rest Breaks  0  0    MPH  2.6  3    METS  3.3  3.8    RPE  9  11    Perceived Dyspnea   0  0    VO2 Peak  11.79  13.64    Symptoms  No  No    Resting HR  60 bpm  69 bpm    Resting BP  122/64  138/72    Resting Oxygen Saturation   98 %  --    Exercise Oxygen Saturation  during 6 min walk  100 %  --    Max Ex. HR  76 bpm  85 bpm    Max Ex. BP  132/72  144/78    2 Minute Post BP  124/68  130/78       Psychological, QOL, Others - Outcomes: PHQ 2/9: Depression screen [facility] 2/9 11/06/2019 10/28/2019 07/25/2019 05/08/2019  Decreased Interest 0 0 0 0  Down, Depressed, Hopeless 1 1 0 0  PHQ - 2 Score 1 1 0 0  Altered sleeping - - - 0  Tired, decreased energy - - - 0  Change in appetite - - - 0   Feeling bad or failure about yourself  - - - 0  Trouble concentrating - - - 0  Moving slowly or fidgety/restless - - - 0  Suicidal thoughts - - - 0  PHQ-9 Score - - - 0    Quality of Life: Quality of Life - 11/06/19 1406      Quality of Life   Select  Quality of Life      Quality of Life Scores   Health/Function Pre  24.63 %    Health/Function Post  21.2 %    Health/Function % Change  -13.93 %    Socioeconomic Pre  17.81 %    Socioeconomic Post  19.57 %    Socioeconomic % Change   9.88 %    Psych/Spiritual Pre  29.14 %    Psych/Spiritual Post  25.71 %    Psych/Spiritual % Change  -11.77 %    Family Pre  27.6 %    Family Post  26.4 %    Family % Change  -4.35 %    GLOBAL Pre  24.4 %    GLOBAL Post  22.56 %    GLOBAL % Change  -7.54 %       Personal Goals: Goals established at orientation with interventions provided to work toward goal. Personal Goals and Risk Factors at Admission - 07/25/19 1241      Core Components/Risk Factors/Patient Goals on Admission    Weight Management  Yes;Weight Maintenance    Intervention  Weight Management: Develop a combined nutrition and exercise program designed to reach desired caloric intake, while maintaining appropriate intake of nutrient and fiber,  sodium and fats, and appropriate energy expenditure required for the weight goal.;Weight Management: Provide education and appropriate resources to help participant work on and attain dietary goals.    Admit Weight  182 lb 5.1 oz (82.7 kg)    Expected Outcomes  Short Term: Continue to assess and modify interventions until short term weight is achieved;Long Term: Adherence to nutrition and physical activity/exercise program aimed toward attainment of established weight goal;Weight Maintenance: Understanding of the daily nutrition guidelines, which includes 25-35% calories from fat, 7% or less cal from saturated fats, less than 256m cholesterol, less than 1.5gm of sodium, & 5 or more servings of  fruits and vegetables daily;Understanding recommendations for meals to include 15-35% energy as protein, 25-35% energy from fat, 35-60% energy from carbohydrates, less than 2068mof dietary cholesterol, 20-35 gm of total fiber daily;Understanding of distribution of calorie intake throughout the day with the consumption of 4-5 meals/snacks    Tobacco Cessation  Yes   Patient quit on 04/08/19   Intervention  Offer self-teaching materials, assist with locating and accessing local/national Quit Smoking programs, and support quit date choice.    Expected Outcomes  Long Term: Complete abstinence from all tobacco products for at least 12 months from quit date.    Diabetes  Yes    Intervention  Provide education about signs/symptoms and action to take for hypo/hyperglycemia.;Provide education about proper nutrition, including hydration, and aerobic/resistive exercise prescription along with prescribed medications to achieve blood glucose in normal ranges: Fasting glucose 65-99 mg/dL    Expected Outcomes  Short Term: Participant verbalizes understanding of the signs/symptoms and immediate care of hyper/hypoglycemia, proper foot care and importance of medication, aerobic/resistive exercise and nutrition plan for blood glucose control.;Long Term: Attainment of HbA1C < 7%.    Heart Failure  Yes    Intervention  --   Patient will monitor weight, follow a low sodium heart healthy diet and exercise at cardiac rehab and at home   Expected Outcomes  Short term: Attendance in program 2-3 days a week with increased exercise capacity. Reported lower sodium intake. Reported increased fruit and vegetable intake. Reports medication compliance.;Short term: Daily weights obtained and reported for increase. Utilizing diuretic protocols set by physician.;Long term: Adoption of self-care skills and reduction of barriers for early signs and symptoms recognition and intervention leading to self-care maintenance.    Lipids  Yes     Intervention  Provide education and support for participant on nutrition & aerobic/resistive exercise along with prescribed medications to achieve LDL <7087mHDL >69m54m  Expected Outcomes  Short Term: Participant states understanding of desired cholesterol values and is compliant with medications prescribed. Participant is following exercise prescription and nutrition guidelines.;Long Term: Cholesterol controlled with medications as prescribed, with individualized exercise RX and with personalized nutrition plan. Value goals: LDL < 70mg51mL > 40 mg.    Stress  Yes    Intervention  Offer individual and/or small group education and counseling on adjustment to heart disease, stress management and health-related lifestyle change. Teach and support self-help strategies.;Refer participants experiencing significant psychosocial distress to appropriate mental health specialists for further evaluation and treatment. When possible, include family members and significant others in education/counseling sessions.    Expected Outcomes  Short Term: Participant demonstrates changes in health-related behavior, relaxation and other stress management skills, ability to obtain effective social support, and compliance with psychotropic medications if prescribed.;Long Term: Emotional wellbeing is indicated by absence of clinically significant psychosocial distress or social isolation.  Personal Goals Discharge: Goals and Risk Factor Review    Row Name 07/30/19 1435 07/30/19 1436 08/15/19 1024 08/26/19 1128 09/25/19 1537     Core Components/Risk Factors/Patient Goals Review   Personal Goals Review  Weight Management/Obesity;Lipids;Tobacco Cessation;Diabetes;Hypertension;Stress  Weight Management/Obesity;Lipids;Tobacco Cessation;Diabetes;Hypertension;Stress  Weight Management/Obesity;Lipids;Tobacco Cessation;Diabetes;Hypertension;Stress  Weight Management/Obesity;Lipids;Tobacco Cessation;Diabetes;Hypertension;Stress   Weight Management/Obesity;Lipids;Tobacco Cessation;Diabetes;Hypertension;Stress   Review  --  Little started exercise on 07/29/19 and exercised without difficulty.  Erique has done well with exercise. Skyeler's vital signs have been stable.  Elim has done well with exercise. Major's vital signs have been stable.  Cardiac rehab has been on hold due to the covid 19 pandemic and re assignment of staff   Expected Outcomes  --  Laurin will continue to particpate in phase 2 cardiac rehab for exercise, nutrition and lifestyle modifications.  Lavoris will continue to particpate in phase 2 cardiac rehab for exercise, nutrition and lifestyle modifications.  Cardiac rehab is currently on hold due to [place] 19 pandemic. Tadeusz is not interested in virtual cardiac rehab at this time but would like to resume phase 2 cardiac rehab when the program reopens.  cardiac rehab is resuming in person rehab on 10/07/19   Row Name 10/28/19 1639 11/06/19 1021           Core Components/Risk Factors/Patient Goals Review   Personal Goals Review  Weight Management/Obesity;Lipids;Tobacco Cessation;Diabetes;Hypertension;Stress  Weight Management/Obesity;Lipids;Tobacco Cessation;Diabetes;Hypertension;Stress      Review  Pt with multiple CAD RFs willing to participate in CR exercise.  Bayne is tolerating exercise well.  He has started walking on the treadmill and is tolerating it well.  Rogerio has graduated from [employer] 20 completed sessions.  He did very well throughout the program.  He was reminded to remain tobacco free.  Shin verbalizes understading and reports that he will.      Expected Outcomes  Martell will continue to participate in CR exercise, nutrition, and lifestyle modification opportunities to reduce his risk of CV disease.  Montgomery will continue to participate in exercise, nutrition, and lifestyle modification opportunities to reduce his risk of CV disease.  He plans to walk and ride his stationary bike for exercise.          Exercise Goals and Review: Exercise Goals    Row Name 07/25/19 1153             Exercise Goals   Increase Physical Activity  Yes       Intervention  Provide advice, education, support and counseling about physical activity/exercise needs.;Develop an individualized exercise prescription for aerobic and resistive training based on initial evaluation findings, risk stratification, comorbidities and participant's personal goals.       Expected Outcomes  Short Term: Attend rehab on a regular basis to increase amount of physical activity.;Long Term: Add in home exercise to make exercise part of routine and to increase amount of physical activity.;Long Term: Exercising regularly at least 3-5 days a week.       Increase Strength and Stamina  Yes       Intervention  Provide advice, education, support and counseling about physical activity/exercise needs.;Develop an individualized exercise prescription for aerobic and resistive training based on initial evaluation findings, risk stratification, comorbidities and participant's personal goals.       Expected Outcomes  Short Term: Increase workloads from initial exercise prescription for resistance, speed, and METs.;Short Term: Perform resistance training exercises routinely during rehab and add in resistance training at home;Long Term: Improve cardiorespiratory fitness, muscular endurance and strength as  measured by increased METs and functional capacity (6MWT)       Able to understand and use rate of perceived exertion (RPE) scale  Yes       Intervention  Provide education and explanation on how to use RPE scale       Expected Outcomes  Short Term: Able to use RPE daily in rehab to express subjective intensity level;Long Term:  Able to use RPE to guide intensity level when exercising independently       Knowledge and understanding of Target Heart Rate Range (THRR)  Yes       Intervention  Provide education and explanation of THRR including how the  numbers were predicted and where they are located for reference       Expected Outcomes  Short Term: Able to state/look up THRR;Long Term: Able to use THRR to govern intensity when exercising independently;Short Term: Able to use daily as guideline for intensity in rehab       Able to check pulse independently  Yes       Intervention  Provide education and demonstration on how to check pulse in carotid and radial arteries.;Review the importance of being able to check your own pulse for safety during independent exercise       Expected Outcomes  Short Term: Able to explain why pulse checking is important during independent exercise;Long Term: Able to check pulse independently and accurately       Understanding of Exercise Prescription  Yes       Intervention  Provide education, explanation, and written materials on patient's individual exercise prescription       Expected Outcomes  Short Term: Able to explain program exercise prescription;Long Term: Able to explain home exercise prescription to exercise independently          Exercise Goals Re-Evaluation: Exercise Goals Re-Evaluation    Row Name 07/29/19 1025 08/22/19 1334 09/25/19 1432 10/16/19 0915 10/28/19 0916     Exercise Goal Re-Evaluation   Exercise Goals Review  Increase Physical Activity;Increase Strength and Stamina;Able to understand and use rate of perceived exertion (RPE) scale;Knowledge and understanding of Target Heart Rate Range (THRR);Understanding of Exercise Prescription  Increase Physical Activity;Increase Strength and Stamina;Able to understand and use rate of perceived exertion (RPE) scale;Knowledge and understanding of Target Heart Rate Range (THRR);Able to check pulse independently;Understanding of Exercise Prescription  Increase Physical Activity;Increase Strength and Stamina;Able to understand and use rate of perceived exertion (RPE) scale;Knowledge and understanding of Target Heart Rate Range (THRR);Able to check pulse  independently;Understanding of Exercise Prescription  Increase Physical Activity;Increase Strength and Stamina;Able to understand and use rate of perceived exertion (RPE) scale;Knowledge and understanding of Target Heart Rate Range (THRR);Able to check pulse independently;Understanding of Exercise Prescription  Increase Physical Activity;Increase Strength and Stamina;Able to understand and use rate of perceived exertion (RPE) scale;Knowledge and understanding of Target Heart Rate Range (THRR);Able to check pulse independently;Understanding of Exercise Prescription   Comments  Pt first day of exercsie in CR program. Pt tolerated exercise Rx well. Pt understands THRR, RPE scale, and exercise Rx.  Pt is progressing well in the program and has a MET level of 3.1. Pt continues to increase workloads and MET level gradually. Pt declined virutal CR program due to closure of department for [place].  Patient will be contacted to resume participation in the onsite cardiac rehab program after temporary closure due to COVID-19 pandemic.  Pt first day returning to exercise after closure for COVID19. Pt tolerated exercise well. Pt understands  THRR, RPE scale, and exercise Rx.  Reviewed HEP with Pt. Pt is exercising at home by using his stationary bike for 30-45 minutes 2-4 days per week in addition to CR program. Spoke with Pt about THRR, RPE scale, end points of exercise, and weather precautions when exercising at home. Pt understands goals.   Expected Outcomes  Will continue to monitor and progress Pt as tolerated.  Pt will continue to exercise at home.  Patient will resume participation in the onsite cardaic rehab program.  Will continue to monitor and progress Pt as tolerated.  Will continue to monitor and progress Pt as tolerated.   [place] Name 11/08/19 0800             Exercise Goal Re-Evaluation   Exercise Goals Review  Increase Physical Activity;Increase Strength and Stamina;Able to understand and use rate of  perceived exertion (RPE) scale;Knowledge and understanding of Target Heart Rate Range (THRR);Able to check pulse independently;Understanding of Exercise Prescription       Comments  Pt completed 20 sessions of Cardiac Rehab. Pt increased his functional capacity by 15.41%. Pt walked 270f more on the post 6MWT. Pt is motivated to keep exercising and remain smoke free.       Expected Outcomes  Pt will continue to walk and bike for exercise 3-4 days a week for 30 minutes.          Nutrition & Weight - Outcomes: Pre Biometrics - 07/25/19 1153      Pre Biometrics   Height  5' 10.75" (1.797 m)    Weight  82.7 kg    Waist Circumference  38 inches    Hip Circumference  39.5 inches    Waist to Hip Ratio  0.96 %    BMI (Calculated)  25.61    Triceps Skinfold  18 mm    % Body Fat  26.2 %    Grip Strength  37 kg    Flexibility  13.25 in    Single Leg Stand  14.12 seconds      Post Biometrics - 11/01/19 1152       Post  Biometrics   Height  5' 10.75" (1.797 m)    Weight  83 kg    Waist Circumference  36.5 inches    Hip Circumference  39 inches    Waist to Hip Ratio  0.94 %    BMI (Calculated)  25.7    Triceps Skinfold  18 mm    % Body Fat  25.5 %    Grip Strength  51.5 kg    Flexibility  13 in    Single Leg Stand  18.45 seconds       Nutrition: Nutrition Therapy & Goals - 08/02/19 1040      Nutrition Therapy   Diet  Heart Healthy/Carbohydrate modified    Drug/Food Interactions  Statins/Certain Fruits      Personal Nutrition Goals   Nutrition Goal  Pt able to name foods that affect blood glucose    Personal Goal #2  Pt to build a healthy plate including vegetables, fruits, whole grains, and low-fat dairy products in a heart healthy meal plan.    Personal Goal #3  CBG concentrations in the normal range or as close to normal as is safely possible      Intervention Plan   Intervention  Nutrition handout(s) given to patient.;Prescribe, educate and counsel regarding individualized  specific dietary modifications aiming towards targeted core components such as weight, hypertension, lipid management, diabetes, heart  failure and other comorbidities.    Expected Outcomes  Short Term Goal: Understand basic principles of dietary content, such as calories, fat, sodium, cholesterol and nutrients.;Short Term Goal: A plan has been developed with personal nutrition goals set during dietitian appointment.;Long Term Goal: Adherence to prescribed nutrition plan.       Nutrition Discharge: Nutrition Assessments - 11/11/19 0945      MEDFICTS Scores   Post Score  20       Education Questionnaire Score: Knowledge Questionnaire Score - 11/06/19 1357      Knowledge Questionnaire Score   Pre Score  21/28    Post Score  23/28       Goals reviewed with patient; copy given to patient.

## 2019-11-19 ENCOUNTER — Other Ambulatory Visit (HOSPITAL_COMMUNITY): Payer: Self-pay | Admitting: Cardiology

## 2019-11-19 MED FILL — EPLERENONE 25 MG TABS: 25 | 30 days supply | Qty: 30 | Fill #3

## 2019-11-19 MED FILL — ASPIRIN CHILD 81 MG TAB CHE: 81 | 30 days supply | Qty: 30 | Fill #2

## 2019-11-20 NOTE — Telephone Encounter (Signed)
Cardiologist:  Bensimhon

## 2019-11-28 ENCOUNTER — Ambulatory Visit: Payer: Medicaid Other | Admitting: Neurology

## 2019-11-28 ENCOUNTER — Encounter: Payer: Self-pay | Admitting: Neurology

## 2019-11-28 ENCOUNTER — Ambulatory Visit: Payer: Medicaid Other | Admitting: Internal Medicine

## 2019-11-28 VITALS — BP 139/76 | HR 71 | Temp 98.0°F | Ht 68.0 in | Wt 185.0 lb

## 2019-11-28 DIAGNOSIS — R519 Headache, unspecified: Secondary | ICD-10-CM

## 2019-11-28 DIAGNOSIS — R570 Cardiogenic shock: Secondary | ICD-10-CM | POA: Insufficient documentation

## 2019-11-28 DIAGNOSIS — I252 Old myocardial infarction: Secondary | ICD-10-CM

## 2019-11-28 DIAGNOSIS — Z955 Presence of coronary angioplasty implant and graft: Secondary | ICD-10-CM | POA: Diagnosis not present

## 2019-11-28 DIAGNOSIS — R0683 Snoring: Secondary | ICD-10-CM | POA: Diagnosis not present

## 2019-11-28 DIAGNOSIS — I255 Ischemic cardiomyopathy: Secondary | ICD-10-CM | POA: Diagnosis not present

## 2019-11-28 NOTE — Progress Notes (Signed)
SLEEP MEDICINE CLINIC    Provider:  Larey Seat, MD  Primary Care Physician:  Ladell Pier, MD [place] [address]     Referring Provider: Ladell Pier, [place] [address] [place],  [place] [address]          Chief Complaint according to patient   Patient presents with:    . New Patient (Initial Visit)     pt alone, rm 10. pt states that he is here to address sleep concerns. he wakes up with headaches but only when he is compliant with his medications. states when he doesnt take medication the headache is there but the second he takes the med he gets instant headache. never had a SS. has been told he snores.       HISTORY OF PRESENT ILLNESS:  Brenda Cowher is a 64  year old Caucasian male patient who is  seen here upon referral on 11/28/2019 from Dr Jaynee Eagles for sleep related headaches. He had both part of covid vaccine.    I have the pleasure of seeing Demone Lyles, a right -handed Caucasian male with a possible sleep disorder.  He   has a past medical history of Cardiogenic shock ([facility]), CHF (congestive heart failure) ([facility]), Coronary artery disease, Diabetes mellitus without complication ([facility]), Hyperlipidemia, and Medical history non-contributory.Marland Kitchen He reports headaches and attributed those to his bedtime medications. He was healthy until his heart attacks, he stated ( 03-29-2019).   Social history:  Patient is working as a Dispensing optician.  he lives in a household with his wife.  Family , with adult son , 2  grandchildren.  The patient currently is unable to work. Pets are present, a cat. Tobacco use: quit 7 month ago - with the heart attack. Marland Kitchen  ETOH use - rare*,  Caffeine intake in form of Coffee( 3/ cups) Soda( none ) Tea ( none) or energy drinks. Regular exercise in form of walking.     Sleep habits are as follows: The patient's dinner time is between 6-7 PM. The patient goes to bed at 10-12 PM and continues to sleep for 7 hours, no   bathroom breaks. The preferred sleep position is supine , with the support of 1 pillow.  Dreams are reportedly rare.Marland Kitchen  6-7 AM is the usual rise time. The patient wakes up spontaneously.  He reports not feeling refreshed or restored in AM, with symptoms such as dry mouth , a dull , generalized , morning headache, and residual fatigue.  Naps are taken infrequently.   Review of Systems: Out of a complete 14 system review, the patient complains of only the following symptoms, and all other reviewed systems are negative.:  Fatigue, snoring, fragmented sleep- Insomnia treated with trazodone.  He took his AM medication by 3 Pm and has a headache within 30 minutes, lasting through the night.   Sleep related headaches.    How likely are you to doze in the following situations: 0 = not likely, 1 = slight chance, 2 = moderate chance, 3 = high chance   Sitting and Reading? Watching Television? Sitting inactive in a public place (theater or meeting)? As a passenger in a car for an hour without a break? Lying down in the afternoon when circumstances permit? Sitting and talking to someone? Sitting quietly after lunch without alcohol? In a car, while stopped for a few minutes in traffic?   Total = 0/ 24 points   FSS endorsed  at 11/ 63 points.   Social History   Socioeconomic History  . Marital status: Married    Spouse name: Not on file  . Number of children: 1  . Years of education: 8  . Highest education level: 8th grade  Occupational History  . Not on file  Tobacco Use  . Smoking status: Former Smoker    Quit date: 04/08/2019    Years since quitting: 0.6  . Smokeless tobacco: Never Used  Substance and Sexual Activity  . Alcohol use: No  . Drug use: No  . Sexual activity: Not on file  Other Topics Concern  . Not on file  Social History Narrative   Lives with wife   Right handed   Caffeine: about 2 cups of coffee every morning, no soda   Social Determinants of Health    Financial Resource Strain: High Risk  . Difficulty of Paying Living Expenses: Hard  Food Insecurity:   . Worried About Charity fundraiser in the Last Year:   . Arboriculturist in the Last Year:   Transportation Needs: No Transportation Needs  . Lack of Transportation (Medical): No  . Lack of Transportation (Non-Medical): No  Physical Activity: Insufficiently Active  . Days of Exercise per Week: 4 days  . Minutes of Exercise per Session: 30 min  Stress: Stress Concern Present  . Feeling of Stress : Rather much  Social Connections:   . Frequency of Communication with Friends and Family:   . Frequency of Social Gatherings with Friends and Family:   . Attends Religious Services:   . Active Member of Clubs or Organizations:   . Attends Archivist Meetings:   Marland Kitchen Marital Status:     Family History  Problem Relation Age of Onset  . Hypertension Mother   . Heart failure Mother   . Headache Neg Hx   . Migraines Neg Hx     Past Medical History:  Diagnosis Date  . Cardiogenic shock ([facility])   . CHF (congestive heart failure) ([facility])   . Coronary artery disease   . Diabetes mellitus without complication ([facility])   . Hyperlipidemia   . Medical history non-contributory     Past Surgical History:  Procedure Laterality Date  . CARDIAC CATHETERIZATION    . CORONARY/GRAFT ACUTE MI REVASCULARIZATION N/A 03/31/2019   Procedure: Coronary/Graft Acute MI Revascularization;  Surgeon: Troy Sine, MD;  Location: [facility];  Service: Cardiovascular;  Laterality: N/A;  . ICD IMPLANT N/A 04/15/2019   Procedure: ICD IMPLANT;  Surgeon: Constance Haw, MD;  Location: [facility];  Service: Cardiovascular;  Laterality: N/A;  . LEFT HEART CATH AND CORONARY ANGIOGRAPHY N/A 03/31/2019   Procedure: LEFT HEART CATH AND CORONARY ANGIOGRAPHY;  Surgeon: Troy Sine, MD;  Location: [facility];  Service: Cardiovascular;  Laterality: N/A;  . NO PAST SURGERIES    . RIGHT  HEART CATH N/A 03/31/2019   Procedure: RIGHT HEART CATH;  Surgeon: Troy Sine, MD;  Location: [facility];  Service: Cardiovascular;  Laterality: N/A;  . VENTRICULAR ASSIST DEVICE INSERTION N/A 03/31/2019   Procedure: VENTRICULAR ASSIST DEVICE INSERTION;  Surgeon: Troy Sine, MD;  Location: [facility];  Service: Cardiovascular;  Laterality: N/A;     Current Outpatient Medications on File Prior to Visit  Medication Sig Dispense Refill  . Accu-Chek Softclix Lancets lancets Use to measure blood sugar twice a day 100 each 6  . aspirin  81 MG chewable tablet CHEW 1 TABLET BY MOUTH DAILY. 30 tablet 2  . atorvastatin (LIPITOR) 80 MG tablet TAKE 1 TABLET BY MOUTH DAILY AT 6 PM. 30 tablet 2  . Blood Glucose Monitoring Suppl (ACCU-CHEK GUIDE ME) w/Device KIT 1 kit by Does not apply route in the morning and at bedtime. Use to measure blood sugar twice a day 1 kit 0  . empagliflozin (JARDIANCE) 10 MG TABS tablet Take 10 mg by mouth daily before breakfast. 30 tablet 11  . eplerenone (INSPRA) 25 MG tablet Take 1 tablet (25 mg total) by mouth daily. 30 tablet 11  . glucose blood (ACCU-CHEK GUIDE) test strip Use to measure blood sugar twice a day 100 each 6  . Hyprom-Naphaz-Polysorb-Zn Sulf (CLEAR EYES COMPLETE) SOLN Apply 1 drop to eye 3 (three) times daily. 15 mL 1  . losartan (COZAAR) 50 MG tablet Take 25 mg by mouth daily.    . metoprolol succinate (TOPROL-XL) 25 MG 24 hr tablet 1/2 tab PO daily 45 tablet 3  . nitroGLYCERIN (NITROSTAT) 0.4 MG SL tablet Place 1 tablet (0.4 mg total) under the tongue every 5 (five) minutes x 3 doses as needed for chest pain. 25 tablet 12  . ticagrelor (BRILINTA) 90 MG TABS tablet Take 1 tablet (90 mg total) by mouth 2 (two) times daily. 60 tablet 2  . traZODone (DESYREL) 50 MG tablet 1/2 to 1 tab PO QHS 30 tablet 2   No current facility-administered medications on file prior to visit.    Allergies  Allergen Reactions  . Spironolactone      gynecomastia  . Metformin And Related Anxiety and Palpitations    Physical exam:  Today's Vitals   11/28/19 1427  BP: 139/76  Pulse: 71  Temp: 98 F (36.7 C)  Weight: 185 lb (83.9 kg)  Height: 5' 8"  (1.727 m)   Body mass index is 28.13 kg/m.   Wt Readings from Last 3 Encounters:  11/28/19 185 lb (83.9 kg)  11/12/19 185 lb (83.9 kg)  11/01/19 182 lb 15.7 oz (83 kg)     Ht Readings from Last 3 Encounters:  11/28/19 5' 8"  (1.727 m)  11/12/19 5' 8"  (1.727 m)  11/01/19 5' 10.75" (1.797 m)      General: The patient is awake, alert and appears not in acute distress. The patient is well groomed. Head: Normocephalic, atraumatic. Neck is supple. Mallampati  2-3,  neck circumference:15 inches . Nasal airflow  patent.  Retrognathia is not seen.  Dental status: poor. He has very rotten teeth.   Cardiovascular:  Regular rate and cardiac rhythm by pulse,  without distended neck veins. Respiratory: Lungs are clear to auscultation.  Skin:  Without evidence of ankle edema, or rash. Trunk: The patient's posture is erect.   Neurologic exam : The patient is awake and alert, oriented to place and time.   Memory subjective described as intact.  Attention span & concentration ability appears normal.  Speech is fluent,  without  dysarthria, dysphonia or aphasia.  Mood and affect are appropriate.   Cranial nerves: no loss of smell or taste reported  Pupils are equal and briskly reactive to light. Funduscopic exam - beginning cataract.   Extraocular movements in vertical and horizontal planes were intact and without nystagmus. No Diplopia. Visual fields by finger perimetry are intact. Hearing was intact to soft voice and finger rubbing.  Facial sensation intact to fine touch.Facial motor strength is symmetric and tongue and uvula move midline.  Neck ROM :  rotation, tilt and flexion extension were normal for age and shoulder shrug was symmetrical.    Motor exam:  Symmetric bulk, tone and  ROM.   Normal tone without cog wheeling, symmetric grip strength . Sensory:  Fine touch, pinprick and vibration were tested  and  normal.  Proprioception tested in the upper extremities was normal. Coordination: Rapid alternating movements in the fingers/hands were of normal speed.  The Finger-to-nose maneuver was intact without evidence of ataxia, dysmetria or tremor.  Gait and station: Patient could rise unassisted from a seated position, walked without assistive device.  Stance is of normal width/ base and the patient turned with 3 steps.  Toe and heel walk were deferred.  Deep tendon reflexes: in the  upper and lower extremities are symmetric and intact.  Babinski response was deferred.      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 He told me that he was 14 days in a coma, he stopped smoking the day of his heart attack. He had coronary artery disease with a LAD stent on 04/11/2019 that was the day of admission, his ejection fraction was estimated at 50%, he has a history of COPD, prediabetes, anemia hyperlipidemia and dental decay since being placed on medication. He states that his teeth have fallen out not broken and that this all started since August. He wakes up with headaches in the morning about 3 times a week and his wife has told him that he snores loudly there is no nausea, no vomiting no photo or phonophobia. He is not sleeping well and it has partially done due to the traumatic event of the heart attack and the feeling that he may not wake up again.  He has no previous history of headaches. Just recently he did delay taking his morning medication until 3 PM and he had a headache free day until he took his medication within 30 min after taking the morning dose he suffered a headache and had to take Tylenol he reports. There is a suspicion that he may have obstructive sleep apnea overlapping with COPD there may be some contribution from this to this morning headaches. Since he never had a sleep study before I will order one now, a CT of the head from 10/14/2019 showed no abnormality, he also was tested for thyroid function, comprehensive metabolic panel, CBC and differential.    1) he feels that medication contributes to his headaches.  2) CAD, but no fatigue and minimal sleepiness. Rather insomnia.   3) loud snoring    My Plan is to proceed with:  1) HST or attended sleep study.  2) hypoxemia screen. 3)   I would like to thank  Ladell Pier, Md [address] [place],  [place] [address] for allowing me to meet with and to take care of this pleasant patient.   In short, Marin Milley is presenting with morning headaches, a symptom that can be attributed to OSA and COPD, may be PTSD.    I plan to follow up  through our NP within 2 month.  CC: I will share my notes with Dr Jaynee Eagles .  Electronically signed by: Larey Seat, MD 11/28/2019 3:07 PM  [facility] Board certified by The [employer] of Sleep Medicine and Diplomate of the [employer] of Sleep Medicine. Board certified In Neurology through the [employer], Fellow of the [employer] of Neurology. Medical Director of [facility].

## 2019-11-28 NOTE — Patient Instructions (Signed)

## 2019-12-12 ENCOUNTER — Telehealth: Payer: Self-pay

## 2019-12-12 NOTE — Telephone Encounter (Signed)
LVM twice to schedule sleep study.  4/12 & 4/22

## 2019-12-16 ENCOUNTER — Telehealth: Payer: Self-pay

## 2019-12-16 NOTE — Telephone Encounter (Signed)
We have attempted to call the patient two times to schedule sleep study.  Patient has been unavailable at the phone numbers we have on file and has not returned our calls. If patient calls back we will schedule them for their sleep study.  

## 2019-12-17 MED FILL — EPLERENONE 25 MG TABLET: 25 | 30 days supply | Qty: 30 | Fill #4

## 2020-01-17 MED FILL — EPLERENONE 25 MG TABLET: 25 | 30 days supply | Qty: 30 | Fill #5

## 2020-01-22 ENCOUNTER — Ambulatory Visit (INDEPENDENT_AMBULATORY_CARE_PROVIDER_SITE_OTHER): Payer: Medicaid Other | Admitting: *Deleted

## 2020-01-22 DIAGNOSIS — I255 Ischemic cardiomyopathy: Secondary | ICD-10-CM | POA: Diagnosis not present

## 2020-01-22 DIAGNOSIS — I5022 Chronic systolic (congestive) heart failure: Secondary | ICD-10-CM

## 2020-01-22 LAB — CUP PACEART REMOTE DEVICE CHECK
Battery Remaining Longevity: 90 mo
Battery Remaining Percentage: 90 %
Battery Voltage: 3.11 V
Brady Statistic AP VP Percent: 1 %
Brady Statistic AP VS Percent: 6.2 %
Brady Statistic AS VP Percent: 1 %
Brady Statistic AS VS Percent: 94 %
Brady Statistic RA Percent Paced: 5.8 %
Brady Statistic RV Percent Paced: 1 %
Date Time Interrogation Session: 20210602041207
HighPow Impedance: 77 Ohm
HighPow Impedance: 77 Ohm
Implantable Lead Implant Date: 20200824
Implantable Lead Implant Date: 20200824
Implantable Lead Location: 753859
Implantable Lead Location: 753860
Implantable Pulse Generator Implant Date: 20200824
Lead Channel Impedance Value: 360 Ohm
Lead Channel Impedance Value: 390 Ohm
Lead Channel Pacing Threshold Amplitude: 0.75 V
Lead Channel Pacing Threshold Amplitude: 0.75 V
Lead Channel Pacing Threshold Pulse Width: 0.5 ms
Lead Channel Pacing Threshold Pulse Width: 0.5 ms
Lead Channel Sensing Intrinsic Amplitude: 1.2 mV
Lead Channel Sensing Intrinsic Amplitude: 11.9 mV
Lead Channel Setting Pacing Amplitude: 2 V
Lead Channel Setting Pacing Amplitude: 2.5 V
Lead Channel Setting Pacing Pulse Width: 0.5 ms
Lead Channel Setting Sensing Sensitivity: 0.5 mV
Pulse Gen Serial Number: 1300954

## 2020-01-23 NOTE — Progress Notes (Signed)
Remote ICD transmission.   

## 2020-02-06 ENCOUNTER — Ambulatory Visit: Payer: Medicaid Other | Attending: Internal Medicine | Admitting: Internal Medicine

## 2020-02-06 ENCOUNTER — Other Ambulatory Visit: Payer: Self-pay

## 2020-02-06 DIAGNOSIS — I2510 Atherosclerotic heart disease of native coronary artery without angina pectoris: Secondary | ICD-10-CM

## 2020-02-06 DIAGNOSIS — Z1211 Encounter for screening for malignant neoplasm of colon: Secondary | ICD-10-CM | POA: Diagnosis not present

## 2020-02-06 DIAGNOSIS — R7303 Prediabetes: Secondary | ICD-10-CM | POA: Diagnosis not present

## 2020-02-06 DIAGNOSIS — G4489 Other headache syndrome: Secondary | ICD-10-CM | POA: Diagnosis not present

## 2020-02-06 NOTE — Progress Notes (Signed)
Virtual Visit via Telephone Note Due to current restrictions/limitations of in-office visits due to the COVID-19 pandemic, this scheduled clinical appointment was converted to a telehealth visit  I connected with Kathleen Argue on 02/06/20 at 9:22 a.m EDT by telephone and verified that I am speaking with the correct person using two identifiers. I am in my office.  The patient is at home.  Only the patient and myself participated in this encounter.  I discussed the limitations, risks, security and privacy concerns of performing an evaluation and management service by telephone and the availability of in person appointments. I also discussed with the patient that there may be a patient responsible charge related to this service. The patient expressed understanding and agreed to proceed.   History of Present Illness: Patient with history of CAD(NSTEMIwith stent to LAD 03/2019), ICM with last EF 45-50%, former smoker, COPD, pre-DM 2, anemia.  Last seen 10/2019.  Purpose of today's visit is chronic disease management.  HA:  Saw 2 neurologist since last visit. Concern for possible OSA as contributing factor Patient tells me that he decided not to have sleep study because HA resolved.  Sleeping well. -went back to work 3 days a wk in [place].  "feeling good."  CAD:  No CP/SOB at rest or exertion. No use of SL Nitro.  No LE edema or palpitations.   PreDM:  Checks BS Q morning with range 108-127.  Doing okay with eating habits.  Still on Jardiance.  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  HM: [place] 11/04/19, 11/25/19.  Has not done FIT as yet.  Outpatient Encounter Medications as of 02/06/2020  Medication Sig  . Accu-Chek Softclix Lancets lancets Use to measure blood sugar twice a day  . aspirin 81 MG chewable tablet CHEW 1 TABLET BY MOUTH DAILY.  Marland Kitchen atorvastatin (LIPITOR) 80 MG tablet TAKE 1 TABLET BY MOUTH DAILY AT 6 PM.  . Blood Glucose Monitoring Suppl  (ACCU-CHEK GUIDE ME) w/Device KIT 1 kit by Does not apply route in the morning and at bedtime. Use to measure blood sugar twice a day  . empagliflozin (JARDIANCE) 10 MG TABS tablet Take 10 mg by mouth daily before breakfast.  . eplerenone (INSPRA) 25 MG tablet Take 1 tablet (25 mg total) by mouth daily.  Marland Kitchen glucose blood (ACCU-CHEK GUIDE) test strip Use to measure blood sugar twice a day  . Hyprom-Naphaz-Polysorb-Zn Sulf (CLEAR EYES COMPLETE) SOLN Apply 1 drop to eye 3 (three) times daily.  Marland Kitchen losartan (COZAAR) 50 MG tablet Take 25 mg by mouth daily.  . metoprolol succinate (TOPROL-XL) 25 MG 24 hr tablet 1/2 tab PO daily  . nitroGLYCERIN (NITROSTAT) 0.4 MG SL tablet Place 1 tablet (0.4 mg total) under the tongue every 5 (five) minutes x 3 doses as needed for chest pain.  . ticagrelor (BRILINTA) 90 MG TABS tablet Take 1 tablet (90 mg total) by mouth 2 (two) times daily.  . traZODone (DESYREL) 50 MG tablet 1/2 to 1 tab PO QHS   No facility-administered encounter medications on file as of 02/06/2020.      Observations/Objective:   Chemistry      Component Value Date/Time   NA 141 09/23/2019 1140   NA 141 05/08/2019 1155   K 4.5 09/23/2019 1140   CL 106 09/23/2019 1140   CO2 27 09/23/2019 1140   BUN 12 09/23/2019 1140   BUN 14 05/08/2019 1155   CREATININE 0.96 09/23/2019 1140      Component  Value Date/Time   CALCIUM 9.2 09/23/2019 1140   ALKPHOS 75 09/23/2019 1140   AST 30 09/23/2019 1140   ALT 42 09/23/2019 1140   BILITOT 0.7 09/23/2019 1140   BILITOT 0.4 05/08/2019 1155     Lab Results  Component Value Date   WBC 6.4 06/20/2019   HGB 12.8 (L) 06/20/2019   HCT 40.0 06/20/2019   MCV 91.3 06/20/2019   PLT 229 06/20/2019   Lab Results  Component Value Date   CHOL 113 09/23/2019   HDL 35 (L) 09/23/2019   LDLCALC 62 09/23/2019   TRIG 79 09/23/2019   CHOLHDL 3.2 09/23/2019     Assessment and Plan: 1. Prediabetes Continue healthy eating habits and trying to stay active.   He is on Jardiance   2. Headache syndrome Patient reports this is resolved.  I encouraged him to still do the sleep study if he is having loud snoring but patient declines  3. Coronary artery disease involving native coronary artery of native heart without angina pectoris Clinically stable and followed by cardiology.  4. Screening for colon cancer Encouraged him to get the fit test done and mail it in the sooner the better.   Follow Up Instructions: 4 mths   I discussed the assessment and treatment plan with the patient. The patient was provided an opportunity to ask questions and all were answered. The patient agreed with the plan and demonstrated an understanding of the instructions.   The patient was advised to call back or seek an in-person evaluation if the symptoms worsen or if the condition fails to improve as anticipated.  I provided 7 minutes of non-face-to-face time during this encounter.   Karle Plumber, MD

## 2020-02-11 MED FILL — EPLERENONE 25 MG TABLET: 25 | 30 days supply | Qty: 30 | Fill #6

## 2020-03-20 MED FILL — EPLERENONE 25 MG TABLET: 25 | 30 days supply | Qty: 30 | Fill #7

## 2020-04-08 ENCOUNTER — Other Ambulatory Visit: Payer: Self-pay | Admitting: Internal Medicine

## 2020-04-08 DIAGNOSIS — G4709 Other insomnia: Secondary | ICD-10-CM

## 2020-04-08 MED ORDER — TRAZODONE HCL 50 MG PO TABS: ORAL_TABLET | ORAL | 2 refills | Status: DC

## 2020-04-08 NOTE — Telephone Encounter (Signed)
Copied from [place] [id number]. Topic: Quick Communication - Rx Refill/Question >> Apr 08, 2020 10:59 AM Yvette Rack wrote: Medication: traZODone (DESYREL) 50 MG tablet  Has the patient contacted their pharmacy? no  Preferred Pharmacy (with phone number or street name): [facility] [place]  Phone: [phone]  Fax: [phone]  Agent: Please be advised that RX refills may take up to 3 business days. We ask that you follow-up with your pharmacy.

## 2020-04-21 MED FILL — EPLERENONE 25 MG TABLET: 25 | 30 days supply | Qty: 30 | Fill #8

## 2020-04-22 ENCOUNTER — Ambulatory Visit (INDEPENDENT_AMBULATORY_CARE_PROVIDER_SITE_OTHER): Payer: Medicaid Other | Admitting: *Deleted

## 2020-04-22 DIAGNOSIS — I255 Ischemic cardiomyopathy: Secondary | ICD-10-CM

## 2020-04-22 LAB — CUP PACEART REMOTE DEVICE CHECK
Battery Remaining Longevity: 87 mo
Battery Remaining Percentage: 88 %
Battery Voltage: 3.07 V
Brady Statistic AP VP Percent: 1 %
Brady Statistic AP VS Percent: 4.7 %
Brady Statistic AS VP Percent: 1 %
Brady Statistic AS VS Percent: 95 %
Brady Statistic RA Percent Paced: 4.3 %
Brady Statistic RV Percent Paced: 1 %
Date Time Interrogation Session: 20210901044536
HighPow Impedance: 73 Ohm
HighPow Impedance: 73 Ohm
Implantable Lead Implant Date: 20200824
Implantable Lead Implant Date: 20200824
Implantable Lead Location: 753859
Implantable Lead Location: 753860
Implantable Pulse Generator Implant Date: 20200824
Lead Channel Impedance Value: 350 Ohm
Lead Channel Impedance Value: 360 Ohm
Lead Channel Pacing Threshold Amplitude: 0.75 V
Lead Channel Pacing Threshold Amplitude: 0.75 V
Lead Channel Pacing Threshold Pulse Width: 0.5 ms
Lead Channel Pacing Threshold Pulse Width: 0.5 ms
Lead Channel Sensing Intrinsic Amplitude: 1.7 mV
Lead Channel Sensing Intrinsic Amplitude: 11.9 mV
Lead Channel Setting Pacing Amplitude: 2 V
Lead Channel Setting Pacing Amplitude: 2.5 V
Lead Channel Setting Pacing Pulse Width: 0.5 ms
Lead Channel Setting Sensing Sensitivity: 0.5 mV
Pulse Gen Serial Number: 1300954

## 2020-04-23 NOTE — Progress Notes (Signed)
Remote ICD transmission.   

## 2020-05-25 MED FILL — EPLERENONE 25 MG TABLET: 25 | 30 days supply | Qty: 30 | Fill #9

## 2020-06-08 ENCOUNTER — Encounter: Payer: Self-pay | Admitting: Pharmacist

## 2020-06-08 ENCOUNTER — Ambulatory Visit (HOSPITAL_BASED_OUTPATIENT_CLINIC_OR_DEPARTMENT_OTHER): Payer: Medicaid Other | Admitting: Pharmacist

## 2020-06-08 ENCOUNTER — Other Ambulatory Visit: Payer: Self-pay

## 2020-06-08 ENCOUNTER — Ambulatory Visit: Payer: Medicaid Other | Attending: Internal Medicine | Admitting: Internal Medicine

## 2020-06-08 ENCOUNTER — Encounter: Payer: Self-pay | Admitting: Internal Medicine

## 2020-06-08 VITALS — BP 168/84 | HR 63 | Resp 16 | Wt 200.0 lb

## 2020-06-08 DIAGNOSIS — J3089 Other allergic rhinitis: Secondary | ICD-10-CM

## 2020-06-08 DIAGNOSIS — I10 Essential (primary) hypertension: Secondary | ICD-10-CM | POA: Diagnosis not present

## 2020-06-08 DIAGNOSIS — E66811 Obesity, class 1: Secondary | ICD-10-CM | POA: Insufficient documentation

## 2020-06-08 DIAGNOSIS — R7303 Prediabetes: Secondary | ICD-10-CM | POA: Diagnosis not present

## 2020-06-08 DIAGNOSIS — I25118 Atherosclerotic heart disease of native coronary artery with other forms of angina pectoris: Secondary | ICD-10-CM | POA: Diagnosis not present

## 2020-06-08 DIAGNOSIS — Z23 Encounter for immunization: Secondary | ICD-10-CM | POA: Diagnosis not present

## 2020-06-08 DIAGNOSIS — R61 Generalized hyperhidrosis: Secondary | ICD-10-CM | POA: Diagnosis not present

## 2020-06-08 DIAGNOSIS — E669 Obesity, unspecified: Secondary | ICD-10-CM

## 2020-06-08 MED ORDER — FLUTICASONE PROPIONATE 50 MCG/ACT NA SUSP: 1.0000 | Freq: Every day | NASAL | 2 refills | Status: DC | PRN

## 2020-06-08 MED ORDER — NITROGLYCERIN 0.4 MG SL SUBL: 0.4000 mg | SUBLINGUAL_TABLET | SUBLINGUAL | 1 refills | Status: DC | PRN

## 2020-06-08 MED ORDER — TICAGRELOR 90 MG PO TABS: 90.0000 mg | ORAL_TABLET | Freq: Two times a day (BID) | ORAL | 4 refills | Status: DC

## 2020-06-08 MED ORDER — LORATADINE 10 MG PO TABS: 10.0000 mg | ORAL_TABLET | Freq: Every day | ORAL | 2 refills | Status: DC

## 2020-06-08 NOTE — Progress Notes (Signed)
Patient presents for vaccination against influenza per orders of Dr. Johnson. Consent given. Counseling provided. No contraindications exists. Vaccine administered without incident.  ° °Luke Van Ausdall, PharmD, CPP °Clinical Pharmacist °[facility] °[phone] ° °

## 2020-06-08 NOTE — Progress Notes (Signed)
Patient ID: Stephen Jennings, male    DOB: [birth year]  MRN: [id number]  CC: Hypertension and Prediabetes   Subjective: Stephen Jennings is a 64 y.o. male who presents for chronic disease management His concerns today include:  Patient with history of CAD(NSTEMIwith stent to LAD 03/2019), ICM with last EF 45-50%, former smoker, COPD, pre-DM 2, anemia.   Pt c/o night sweats for 3-4 mths.  Usually wakes up around 3-4 a.m in the mornings drench in sweat. No new cough/worsening SOB, rash,or wgh loss Usually sleeps with house temp at 72.  Sleeps under a sheet and comforter.  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  No recent use of sublingual nitroglycerin.  He needs refill on nitroglycerin as the pills in his current bottle have turned to powder. No CP/SOB at rest BP has been up and down.  Checks BP daily.  Reports readings of 134/70s, 140/80.  Tries to limit salt in foods.  He has not taken his medications including Cozaar and metoprolol as yet for the morning.  Taking Tylenol Sinus pill one a day.  Wonders if it is causing BP elev. -wakes up with nasal congestion.  Endorses drainage at back of throat, lacrimation and itchy eyes.  Prediabetes: Wgh up 15 lbs from last visit. Admits that he drinks sodas especially Pepsi. He also attributes the weight gain to eating late at nights due to his work schedule. He is now able to get home earlier so we will try to eat dinner before 6 PM.  HM:  Needs flu shot  Patient Active Problem List   Diagnosis Date Noted  . Cardiogenic shock ([place]) 11/28/2019  . Snoring 11/28/2019  . Morning headache 11/28/2019  . Chronic daily headache 10/08/2019  . History of ST elevation myocardial infarction (STEMI) 05/08/2019  . Ischemic cardiomyopathy 05/08/2019  . 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 History of  placement of stent in LAD coronary artery 05/08/2019     Current Outpatient Medications on File Prior to Visit  Medication Sig Dispense Refill  . Accu-Chek Softclix Lancets lancets Use to measure blood sugar twice a day 100 each 6  . aspirin 81 MG chewable tablet CHEW 1 TABLET BY MOUTH DAILY. 30 tablet 2  . atorvastatin (LIPITOR) 80 MG tablet TAKE 1 TABLET BY MOUTH DAILY AT 6 PM. 30 tablet 2  . Blood Glucose Monitoring Suppl (ACCU-CHEK GUIDE ME) w/Device KIT 1 kit by Does not apply route in the morning and at bedtime. Use to measure blood sugar twice a day 1 kit 0  . empagliflozin (JARDIANCE) 10 MG TABS tablet Take 10 mg by mouth daily before breakfast. 30 tablet 11  . eplerenone (INSPRA) 25 MG tablet Take 1 tablet (25 mg total) by mouth daily. 30 tablet 11  . glucose blood (ACCU-CHEK GUIDE) test strip Use to measure blood sugar twice a day 100 each 6  . Hyprom-Naphaz-Polysorb-Zn Sulf (CLEAR EYES COMPLETE) SOLN Apply 1 drop to eye 3 (three) times daily. 15 mL 1  . losartan (COZAAR) 50 MG tablet Take 25 mg by mouth daily.    . metoprolol succinate (TOPROL-XL) 25 MG 24 hr tablet 1/2 tab PO daily 45 tablet 3  . traZODone (DESYREL) 50 MG tablet 1/2 to 1 tab PO QHS 30 tablet 2   No current facility-administered medications on file prior to visit.  Allergies  Allergen Reactions  . Spironolactone     gynecomastia  . Metformin And Related Anxiety and Palpitations    Social History   Socioeconomic History  . Marital status: Married    Spouse name: Not on file  . Number of children: 1  . Years of education: 8  . Highest education level: 8th grade  Occupational History  . Not on file  Tobacco Use  . Smoking status: Former Smoker    Quit date: 04/08/2019    Years since quitting: 1.1  . Smokeless tobacco: Never Used  Vaping Use  . Vaping Use: Never used  Substance and Sexual Activity  . Alcohol use: No  . Drug use: No  . Sexual activity: Not on file  Other Topics Concern  . Not  on file  Social History Narrative   Lives with wife   Right handed   Caffeine: about 2 cups of coffee every morning, no soda   Social Determinants of Health   Financial Resource Strain: High Risk  . Difficulty of Paying Living Expenses: Hard  Food Insecurity:   . Worried About Charity fundraiser in the Last Year: Not on file  . Ran Out of Food in the Last Year: Not on file  Transportation Needs: No Transportation Needs  . Lack of Transportation (Medical): No  . Lack of Transportation (Non-Medical): No  Physical Activity: Insufficiently Active  . Days of Exercise per Week: 4 days  . Minutes of Exercise per Session: 30 min  Stress: Stress Concern Present  . Feeling of Stress : Rather much  Social Connections:   . Frequency of Communication with Friends and Family: Not on file  . Frequency of Social Gatherings with Friends and Family: Not on file  . Attends Religious Services: Not on file  . Active Member of Clubs or Organizations: Not on file  . Attends Archivist Meetings: Not on file  . Marital Status: Not on file  Intimate Partner Violence:   . Fear of Current or Ex-Partner: Not on file  . Emotionally Abused: Not on file  . Physically Abused: Not on file  . Sexually Abused: Not on file    Family History  Problem Relation Age of Onset  . Hypertension Mother   . Heart failure Mother   . Headache Neg Hx   . Migraines Neg Hx     Past Surgical History:  Procedure Laterality Date  . CARDIAC CATHETERIZATION    . CORONARY/GRAFT ACUTE MI REVASCULARIZATION N/A 03/31/2019   Procedure: Coronary/Graft Acute MI Revascularization;  Surgeon: Troy Sine, MD;  Location: [facility];  Service: Cardiovascular;  Laterality: N/A;  . ICD IMPLANT N/A 04/15/2019   Procedure: ICD IMPLANT;  Surgeon: Constance Haw, MD;  Location: [facility];  Service: Cardiovascular;  Laterality: N/A;  . LEFT HEART CATH AND CORONARY ANGIOGRAPHY N/A 03/31/2019   Procedure: LEFT  HEART CATH AND CORONARY ANGIOGRAPHY;  Surgeon: Troy Sine, MD;  Location: [facility];  Service: Cardiovascular;  Laterality: N/A;  . NO PAST SURGERIES    . RIGHT HEART CATH N/A 03/31/2019   Procedure: RIGHT HEART CATH;  Surgeon: Troy Sine, MD;  Location: [facility];  Service: Cardiovascular;  Laterality: N/A;  . VENTRICULAR ASSIST DEVICE INSERTION N/A 03/31/2019   Procedure: VENTRICULAR ASSIST DEVICE INSERTION;  Surgeon: Troy Sine, MD;  Location: [facility];  Service: Cardiovascular;  Laterality: N/A;    ROS: Review of  Systems Negative except as stated above  PHYSICAL EXAM: BP (!) 168/84   Pulse 63   Resp 16   Wt 200 lb (90.7 kg)   SpO2 97%   BMI 30.41 kg/m   Wt Readings from Last 3 Encounters:  06/08/20 200 lb (90.7 kg)  11/28/19 185 lb (83.9 kg)  11/12/19 185 lb (83.9 kg)    Physical Exam  General appearance - alert, well appearing, older Caucasian male and in no distress Mental status - normal mood, behavior, speech, dress, motor activity, and thought processes Nose - normal and patent, no erythema, discharge or polyps Mouth - mucous membranes moist, pharynx normal without lesions.  Patient is edentulous neck -no cervical masses.  No thyromegaly or thyroid nodules. Lymphatics -no cervical or axillary lymphadenopathy palpated. Chest - clear to auscultation, no wheezes, rales or rhonchi, symmetric air entry Heart - normal rate, regular rhythm, normal S1, S2, no murmurs, rubs, clicks or gallops Abdomen -normal bowel sounds.  Nondistended.  Nontender.  No organomegaly. Extremities -no lower extremity edema. CMP Latest Ref Rng & Units 09/23/2019 08/21/2019 08/12/2019  Glucose 70 - 99 mg/dL 117(H) 116(H) 112(H)  BUN 8 - 23 mg/dL _0 Creatinine 0.61 - 1.24 mg/dL 0.96 0.99 0.94  Sodium 135 - 145 mmol/L 141 140 140  Potassium 3.5 - 5.1 mmol/L 4.5 4.6 4.5  Chloride 98 - 111 mmol/L 106 104 107  CO2 22 - 32 mmol/L _1 Calcium 8.9 -  10.3 mg/dL 9.2 9.5 9.1  Total Protein 6.5 - 8.1 g/dL 6.6 - -  Total Bilirubin 0.3 - 1.2 mg/dL 0.7 - -  Alkaline Phos 38 - 126 U/L 75 - -  AST 15 - 41 U/L 30 - -  ALT 0 - 44 U/L 42 - -   Lipid Panel     Component Value Date/Time   CHOL 113 09/23/2019 1140   TRIG 79 09/23/2019 1140   HDL 35 (L) 09/23/2019 1140   CHOLHDL 3.2 09/23/2019 1140   VLDL 16 09/23/2019 1140   LDLCALC 62 09/23/2019 1140    CBC    Component Value Date/Time   WBC 6.4 06/20/2019 1222   RBC 4.38 06/20/2019 1222   HGB 12.8 (L) 06/20/2019 1222   HCT 40.0 06/20/2019 1222   PLT 229 06/20/2019 1222   MCV 91.3 06/20/2019 1222   MCH 29.2 06/20/2019 1222   MCHC 32.0 06/20/2019 1222   RDW 14.5 06/20/2019 1222   LYMPHSABS 0.9 04/09/2019 0615   MONOABS 0.9 04/09/2019 0615   EOSABS 0.3 04/09/2019 0615   BASOSABS 0.0 04/09/2019 0615    ASSESSMENT AND PLAN: 1. Coronary artery disease of native artery of native heart with stable angina pectoris ([place]) Followed by cardiology. He will continue aspirin and Brilinta and find out from the cardiologist on next visit whether he needs to continue with dual antiplatelet agent.  Continue Lipitor, beta-blocker - nitroGLYCERIN (NITROSTAT) 0.4 MG SL tablet; Place 1 tablet (0.4 mg total) under the tongue every 5 (five) minutes x 3 doses as needed for chest pain.  Dispense: 25 tablet; Refill: 1 - ticagrelor (BRILINTA) 90 MG TABS tablet; Take 1 tablet (90 mg total) by mouth 2 (two) times daily.  Dispense: 60 tablet; Refill: 4  2. Essential hypertension Not at goal.  Patient has not taken medicines as yet for the morning.  He will take medicines when he returns home.  I will have him follow-up with the clinical pharmacist in 2 weeks for repeat blood pressure  check.  Advised to stop the over the counter sinus medication that he is taking as it may have pseudoephedrine in it. - CBC - Basic metabolic panel  3. Obesity (BMI 30.0-34.9) 4. Prediabetes I spent some time on dietary  counseling.  Advised him to eliminate sugary drinks from the diet.  Cut back on portion sizes.  Avoid eating late at night.  Cut back on the white carbohydrates and incorporate fresh fruits and vegetables into the diet. - Hemoglobin A1c  5. Non-seasonal allergic rhinitis, unspecified trigger Stop over-the-counter sinus medication. - loratadine (CLARITIN) 10 MG tablet; Take 1 tablet (10 mg total) by mouth daily.  Dispense: 30 tablet; Refill: 2 - fluticasone (FLONASE) 50 MCG/ACT nasal spray; Place 1 spray into both nostrils daily as needed for allergies or rhinitis.  Dispense: 16 g; Refill: 2  6. Excessive sweating History and physical exam unrevealing at this time.  I recommend that he either lower the temperature on his thermostat at night while asleep without the comforter.  7. Need for influenza vaccination Given today.    Patient was given the opportunity to ask questions.  Patient verbalized understanding of the plan and was able to repeat key elements of the plan.   Orders Placed This Encounter  Procedures  . CBC  . Basic metabolic panel  . Hemoglobin A1c     Requested Prescriptions   Signed Prescriptions Disp Refills  . nitroGLYCERIN (NITROSTAT) 0.4 MG SL tablet 25 tablet 1    Sig: Place 1 tablet (0.4 mg total) under the tongue every 5 (five) minutes x 3 doses as needed for chest pain.  . ticagrelor (BRILINTA) 90 MG TABS tablet 60 tablet 4    Sig: Take 1 tablet (90 mg total) by mouth 2 (two) times daily.  Marland Kitchen loratadine (CLARITIN) 10 MG tablet 30 tablet 2    Sig: Take 1 tablet (10 mg total) by mouth daily.  . fluticasone (FLONASE) 50 MCG/ACT nasal spray 16 g 2    Sig: Place 1 spray into both nostrils daily as needed for allergies or rhinitis.    Return in about 4 months (around 10/09/2020) for Give appt with [facility] in 2 wks for BP recheck.  Karle Plumber, MD, FACP

## 2020-06-08 NOTE — Patient Instructions (Addendum)
Stop the Tylenol sinus medicine that you are taking from over-the-counter. I have prescribed Claritin and Flonase nasal spray for your allergy symptoms. If your insurance does not cover the nasal spray, it can be purchased over-the-counter.  Check your blood pressure at least twice a week. Bring in your recorded readings in 2 weeks when you come to see the clinical pharmacist for repeat blood pressure check.   Obesity, Adult Obesity is having too much body fat. Being obese means that your weight is more than what is healthy for you. BMI is a number that explains how much body fat you have. If you have a BMI of 30 or more, you are obese. Obesity is often caused by eating or drinking more calories than your body uses. Changing your lifestyle can help you lose weight. Obesity can cause serious health problems, such as:  Stroke.  Coronary artery disease (CAD).  Type 2 diabetes.  Some types of cancer, including cancers of the colon, breast, uterus, and gallbladder.  Osteoarthritis.  High blood pressure (hypertension).  High cholesterol.  Sleep apnea.  Gallbladder stones.  Infertility problems. What are the causes?  Eating meals each day that are high in calories, sugar, and fat.  Being born with genes that may make you more likely to become obese.  Having a medical condition that causes obesity.  Taking certain medicines.  Sitting a lot (having a sedentary lifestyle).  Not getting enough sleep.  Drinking a lot of drinks that have sugar in them. What increases the risk?  Having a family history of obesity.  Being an [place] American woman.  Being a Hispanic man.  Living in an area with limited access to: ? Romilda Garret, recreation centers, or sidewalks. ? Healthy food choices, such as grocery stores and farmers' markets. What are the signs or symptoms? The main sign is having too much body fat. How is this treated?  Treatment for this condition often includes changing  your lifestyle. Treatment may include: ? Changing your diet. This may include making a healthy meal plan. ? Exercise. This may include activity that causes your heart to beat faster (aerobic exercise) and strength training. Work with your doctor to design a program that works for you. ? Medicine to help you lose weight. This may be used if you are not able to lose 1 pound a week after 6 weeks of healthy eating and more exercise. ? Treating conditions that cause the obesity. ? Surgery. Options may include gastric banding and gastric bypass. This may be done if:  Other treatments have not helped to improve your condition.  You have a BMI of 40 or higher.  You have life-threatening health problems related to obesity. Follow these instructions at home: Eating and drinking   Follow advice from your doctor about what to eat and drink. Your doctor may tell you to: ? Limit fast food, sweets, and processed snack foods. ? Choose low-fat options. For example, choose low-fat milk instead of whole milk. ? Eat 5 or more servings of fruits or vegetables each day. ? Eat at home more often. This gives you more control over what you eat. ? Choose healthy foods when you eat out. ? Learn to read food labels. This will help you learn how much food is in 1 serving. ? Keep low-fat snacks available. ? Avoid drinks that have a lot of sugar in them. These include soda, fruit juice, iced tea with sugar, and flavored milk.  Drink enough water to keep your pee (  urine) pale yellow.  Do not go on fad diets. Physical activity  Exercise often, as told by your doctor. Most adults should get up to 150 minutes of moderate-intensity exercise every week.Ask your doctor: ? What types of exercise are safe for you. ? How often you should exercise.  Warm up and stretch before being active.  Do slow stretching after being active (cool down).  Rest between times of being active. Lifestyle  Work with your doctor and a  food expert (dietitian) to set a weight-loss goal that is best for you.  Limit your screen time.  Find ways to reward yourself that do not involve food.  Do not drink alcohol if: ? Your doctor tells you not to drink. ? You are pregnant, may be pregnant, or are planning to become pregnant.  If you drink alcohol: ? Limit how much you use to:  0-1 drink a day for women.  0-2 drinks a day for men. ? Be aware of how much alcohol is in your drink. In the [place] one drink equals one 12 oz bottle of beer (355 mL), one 5 oz glass of wine (148 mL), or one 1 oz glass of hard liquor (44 mL). General instructions  Keep a weight-loss journal. This can help you keep track of: ? The food that you eat. ? How much exercise you get.  Take over-the-counter and prescription medicines only as told by your doctor.  Take vitamins and supplements only as told by your doctor.  Think about joining a support group.  Keep all follow-up visits as told by your doctor. This is important. Contact a doctor if:  You cannot meet your weight loss goal after you have changed your diet and lifestyle for 6 weeks. Get help right away if you:  Are having trouble breathing.  Are having thoughts of harming yourself. Summary  Obesity is having too much body fat.  Being obese means that your weight is more than what is healthy for you.  Work with your doctor to set a weight-loss goal.  Get regular exercise as told by your doctor. This information is not intended to replace advice given to you by your health care provider. Make sure you discuss any questions you have with your health care provider. Document Revised: 04/12/2018 Document Reviewed: 04/12/2018 Elsevier Patient Education  [place].    Influenza Virus Vaccine injection (Fluarix) What is this medicine? INFLUENZA VIRUS VACCINE (in floo EN zuh VAHY ruhs vak SEEN) helps to reduce the risk of getting influenza also known as the flu. This  medicine may be used for other purposes; ask your health care provider or pharmacist if you have questions. COMMON BRAND NAME(S): Fluarix, Fluzone What should I tell my health care provider before I take this medicine? They need to know if you have any of these conditions:  bleeding disorder like hemophilia  fever or infection  Guillain-Barre syndrome or other neurological problems  immune system problems  infection with the human immunodeficiency virus (HIV) or AIDS  low blood platelet counts  multiple sclerosis  an unusual or allergic reaction to influenza virus vaccine, eggs, chicken proteins, latex, gentamicin, other medicines, foods, dyes or preservatives  pregnant or trying to get pregnant  breast-feeding How should I use this medicine? This vaccine is for injection into a muscle. It is given by a health care professional. A copy of Vaccine Information Statements will be given before each vaccination. Read this sheet carefully each time. The sheet may change  frequently. Talk to your pediatrician regarding the use of this medicine in children. Special care may be needed. Overdosage: If you think you have taken too much of this medicine contact a poison control center or emergency room at once. NOTE: This medicine is only for you. Do not share this medicine with others. What if I miss a dose? This does not apply. What may interact with this medicine?  chemotherapy or radiation therapy  medicines that lower your immune system like etanercept, anakinra, infliximab, and adalimumab  medicines that treat or prevent blood clots like warfarin  phenytoin  steroid medicines like prednisone or cortisone  theophylline  vaccines This list may not describe all possible interactions. Give your health care provider a list of all the medicines, herbs, non-prescription drugs, or dietary supplements you use. Also tell them if you smoke, drink alcohol, or use illegal drugs. Some items  may interact with your medicine. What should I watch for while using this medicine? Report any side effects that do not go away within 3 days to your doctor or health care professional. Call your health care provider if any unusual symptoms occur within 6 weeks of receiving this vaccine. You may still catch the flu, but the illness is not usually as bad. You cannot get the flu from the vaccine. The vaccine will not protect against colds or other illnesses that may cause fever. The vaccine is needed every year. What side effects may I notice from receiving this medicine? Side effects that you should report to your doctor or health care professional as soon as possible:  allergic reactions like skin rash, itching or hives, swelling of the face, lips, or tongue Side effects that usually do not require medical attention (report to your doctor or health care professional if they continue or are bothersome):  fever  headache  muscle aches and pains  pain, tenderness, redness, or swelling at site where injected  weak or tired This list may not describe all possible side effects. Call your doctor for medical advice about side effects. You may report side effects to FDA at 1-800-FDA-1088. Where should I keep my medicine? This vaccine is only given in a clinic, pharmacy, doctor's office, or other health care setting and will not be stored at home. NOTE: This sheet is a summary. It may not cover all possible information. If you have questions about this medicine, talk to your doctor, pharmacist, or health care provider.  2020 Elsevier/Gold Standard (2008-03-05 09:30:40)

## 2020-06-09 LAB — HEMOGLOBIN A1C
Est. average glucose Bld gHb Est-mCnc: 131 mg/dL
Hgb A1c MFr Bld: 6.2 % — ABNORMAL HIGH (ref 4.8–5.6)

## 2020-06-09 LAB — CBC
Hematocrit: 44.8 % (ref 37.5–51.0)
Hemoglobin: 14.7 g/dL (ref 13.0–17.7)
MCH: 29.3 pg (ref 26.6–33.0)
MCHC: 32.8 g/dL (ref 31.5–35.7)
MCV: 89 fL (ref 79–97)
Platelets: 276 10*3/uL (ref 150–450)
RBC: 5.01 x10E6/uL (ref 4.14–5.80)
RDW: 13.1 % (ref 11.6–15.4)
WBC: 6.3 10*3/uL (ref 3.4–10.8)

## 2020-06-09 LAB — BASIC METABOLIC PANEL
BUN/Creatinine Ratio: 13 (ref 10–24)
BUN: 12 mg/dL (ref 8–27)
CO2: 24 mmol/L (ref 20–29)
Calcium: 9.3 mg/dL (ref 8.6–10.2)
Chloride: 103 mmol/L (ref 96–106)
Creatinine, Ser: 0.94 mg/dL (ref 0.76–1.27)
GFR calc Af Amer: 99 mL/min/{1.73_m2} (ref >59)
GFR calc non Af Amer: 85 mL/min/{1.73_m2} (ref >59)
Glucose: 120 mg/dL — ABNORMAL HIGH (ref 65–99)
Potassium: 4.7 mmol/L (ref 3.5–5.2)
Sodium: 140 mmol/L (ref 134–144)

## 2020-06-09 NOTE — Progress Notes (Signed)
Blood cell counts including red, white and platelet counts are normal. Still in range for preDM.  Continue healthy eating habits and exercise as tolerated.

## 2020-06-10 NOTE — Progress Notes (Signed)
Normal result letter generated and mailed to address on file. 

## 2020-06-22 ENCOUNTER — Other Ambulatory Visit: Payer: Self-pay

## 2020-06-22 ENCOUNTER — Ambulatory Visit: Payer: Medicaid Other | Attending: Internal Medicine | Admitting: Pharmacist

## 2020-06-22 VITALS — BP 168/94 | HR 64

## 2020-06-22 DIAGNOSIS — I10 Essential (primary) hypertension: Secondary | ICD-10-CM

## 2020-06-22 MED ORDER — LOSARTAN POTASSIUM 50 MG PO TABS: 25.0000 mg | ORAL_TABLET | Freq: Every day | ORAL | 1 refills | Status: DC

## 2020-06-22 MED FILL — EPLERENONE 25 MG TABLET: 25 | 30 days supply | Qty: 30 | Fill #10

## 2020-06-22 NOTE — Progress Notes (Signed)
     S:    Patient arrives in good spirits. Presents to the clinic for hypertension evaluation, counseling, and management.  Patient was referred and last seen by Primary Care Provider on 06/08/2020.    Medication adherence denied. He has taken today's dose of eplerenone. He has not taken his metoprolol as he takes this at 9 PM. He tells me today that he has not taken losartan for some time d/t a jittery feeling. Upon chart review, he reported this feeling with carvedilol, not losartan. He is amenable to restarting losartan today.   Current BP Medications include:  Eplerenone 25 mg daily, Losartan 50 mg daily (not taking), Toprol 25 mg XL daily  Dietary habits include: compliant with salt restriction; denies drinking excess caffeine (~1-1.5 cups of coffee/day) Exercise habits include: none outside of work Family / Social history:  - FHx: HTN, HF, migraines - Tobacco: former smoker  - Alcohol: denies use   O:  Vitals:   06/22/20 1110  BP: (!) 168/94  Pulse: 64   Home BP readings:  Reports SBPs 130s-140s Reports DBPs 80s  Last 3 Office BP readings: BP Readings from Last 3 Encounters:  06/22/20 (!) 168/94  06/08/20 (!) 168/84  11/28/19 139/76   BMET    Component Value Date/Time   NA 140 06/08/2020 1003   K 4.7 06/08/2020 1003   CL 103 06/08/2020 1003   CO2 24 06/08/2020 1003   GLUCOSE 120 (H) 06/08/2020 1003   GLUCOSE 117 (H) 09/23/2019 1140   BUN 12 06/08/2020 1003   CREATININE 0.94 06/08/2020 1003   CALCIUM 9.3 06/08/2020 1003   GFRNONAA 85 06/08/2020 1003   GFRAA 99 06/08/2020 1003    Renal function: CrCl cannot be calculated (Unknown ideal weight.).  Clinical ASCVD: No  The ASCVD Risk score Mikey Bussing DC Jr., et al., 2013) failed to calculate for the following reasons:   The patient has a prior MI or stroke diagnosis  A/P: Hypertension longstanding currently uncontrolled on current medications. BP Goal = < 130/80 mmHg. Medication adherence: pt is taking his  eplerenone and Toprol, however, he is not taking losartan. I showed him this medication on his med list and he confirmed that he stopped taking it several weeks ago.  -Restarted losartan 25 mg daily.  -Continue eplerenone, Toprol XL as prescribed. -Counseled on lifestyle modifications for blood pressure control including reduced dietary sodium, increased exercise, adequate sleep.  Results reviewed and written information provided.   Total time in face-to-face counseling 15 minutes.   F/U Clinic Visit in 2 weeks.   Benard Halsted, PharmD, [place] [phone]

## 2020-07-06 ENCOUNTER — Other Ambulatory Visit: Payer: Self-pay

## 2020-07-06 ENCOUNTER — Ambulatory Visit: Payer: Medicaid Other | Attending: Internal Medicine | Admitting: Pharmacist

## 2020-07-06 VITALS — BP 162/94 | HR 67

## 2020-07-06 DIAGNOSIS — I10 Essential (primary) hypertension: Secondary | ICD-10-CM

## 2020-07-06 MED ORDER — EPLERENONE 25 MG PO TABS: 25.0000 mg | ORAL_TABLET | Freq: Every day | ORAL | 11 refills | Status: DC

## 2020-07-06 NOTE — Progress Notes (Signed)
     S:    Patient arrives in good spirits. Presents to the clinic for hypertension evaluation, counseling, and management.  Patient was referred and last seen by Primary Care Provider on 06/08/2020. I saw him 06/22/20; BP was elevated but pt was not taking losartan. I reordered this for him.   Medication adherence reported.   Current BP Medications include:  Eplerenone 25 mg daily, Losartan 25 mg daily, Toprol 12.5 mg XL daily  Dietary habits include: compliant with salt restriction; denies drinking excess caffeine (~1-1.5 cups of coffee/day) Exercise habits include: none outside of work Family / Social history:  - FHx: HTN, HF, migraines - Tobacco: former smoker  - Alcohol: denies use   O:  Vitals:   07/06/20 0955  BP: (!) 162/94  Pulse: 67   Home BP readings:  Reports SBPs 130s-140s Reports DBPs 80s  Last 3 Office BP readings: BP Readings from Last 3 Encounters:  07/06/20 (!) 162/94  06/22/20 (!) 168/94  06/08/20 (!) 168/84   BMET    Component Value Date/Time   NA 140 06/08/2020 1003   K 4.7 06/08/2020 1003   CL 103 06/08/2020 1003   CO2 24 06/08/2020 1003   GLUCOSE 120 (H) 06/08/2020 1003   GLUCOSE 117 (H) 09/23/2019 1140   BUN 12 06/08/2020 1003   CREATININE 0.94 06/08/2020 1003   CALCIUM 9.3 06/08/2020 1003   GFRNONAA 85 06/08/2020 1003   GFRAA 99 06/08/2020 1003    Renal function: CrCl cannot be calculated (Patient's most recent lab result is older than the maximum 21 days allowed.).  Clinical ASCVD: No  The ASCVD Risk score Mikey Bussing DC Jr., et al., 2013) failed to calculate for the following reasons:   The patient has a prior MI or stroke diagnosis  A/P: Hypertension longstanding currently above goal on current medications. Bps at home look better; some are at goal. BP Goal = < 130/80 mmHg. Medication adherence reported. Will have him return in two weeks. Will likely need to titrate losartan dose.  -Continued current regimen.  -Counseled on lifestyle  modifications for blood pressure control including reduced dietary sodium, increased exercise, adequate sleep.  Results reviewed and written information provided.   Total time in face-to-face counseling 15 minutes.   F/U Clinic Visit in 2 weeks.   Benard Halsted, PharmD, [place] [phone]

## 2020-07-20 ENCOUNTER — Ambulatory Visit: Payer: Medicaid Other | Admitting: Pharmacist

## 2020-07-22 ENCOUNTER — Ambulatory Visit (INDEPENDENT_AMBULATORY_CARE_PROVIDER_SITE_OTHER): Payer: Medicaid Other

## 2020-07-22 DIAGNOSIS — I255 Ischemic cardiomyopathy: Secondary | ICD-10-CM

## 2020-07-22 LAB — CUP PACEART REMOTE DEVICE CHECK
Battery Remaining Longevity: 85 mo
Battery Remaining Percentage: 86 %
Battery Voltage: 3.04 V
Brady Statistic AP VP Percent: 1 %
Brady Statistic AP VS Percent: 3.9 %
Brady Statistic AS VP Percent: 1 %
Brady Statistic AS VS Percent: 96 %
Brady Statistic RA Percent Paced: 3.6 %
Brady Statistic RV Percent Paced: 1 %
Date Time Interrogation Session: 20211201040016
HighPow Impedance: 80 Ohm
HighPow Impedance: 80 Ohm
Implantable Lead Implant Date: 20200824
Implantable Lead Implant Date: 20200824
Implantable Lead Location: 753859
Implantable Lead Location: 753860
Implantable Pulse Generator Implant Date: 20200824
Lead Channel Impedance Value: 350 Ohm
Lead Channel Impedance Value: 380 Ohm
Lead Channel Pacing Threshold Amplitude: 0.75 V
Lead Channel Pacing Threshold Amplitude: 0.75 V
Lead Channel Pacing Threshold Pulse Width: 0.5 ms
Lead Channel Pacing Threshold Pulse Width: 0.5 ms
Lead Channel Sensing Intrinsic Amplitude: 1.5 mV
Lead Channel Sensing Intrinsic Amplitude: 11.9 mV
Lead Channel Setting Pacing Amplitude: 2 V
Lead Channel Setting Pacing Amplitude: 2.5 V
Lead Channel Setting Pacing Pulse Width: 0.5 ms
Lead Channel Setting Sensing Sensitivity: 0.5 mV
Pulse Gen Serial Number: 1300954

## 2020-07-28 NOTE — Progress Notes (Signed)
Remote ICD transmission.   

## 2020-10-05 ENCOUNTER — Other Ambulatory Visit: Payer: Self-pay

## 2020-10-05 ENCOUNTER — Encounter: Payer: Self-pay | Admitting: Internal Medicine

## 2020-10-05 ENCOUNTER — Ambulatory Visit: Payer: Medicaid Other | Attending: Internal Medicine | Admitting: Internal Medicine

## 2020-10-05 VITALS — BP 160/84 | HR 66 | Resp 16 | Wt 208.0 lb

## 2020-10-05 DIAGNOSIS — I25118 Atherosclerotic heart disease of native coronary artery with other forms of angina pectoris: Secondary | ICD-10-CM | POA: Diagnosis not present

## 2020-10-05 DIAGNOSIS — I10 Essential (primary) hypertension: Secondary | ICD-10-CM | POA: Diagnosis not present

## 2020-10-05 DIAGNOSIS — K219 Gastro-esophageal reflux disease without esophagitis: Secondary | ICD-10-CM | POA: Diagnosis not present

## 2020-10-05 DIAGNOSIS — I5022 Chronic systolic (congestive) heart failure: Secondary | ICD-10-CM

## 2020-10-05 DIAGNOSIS — R7303 Prediabetes: Secondary | ICD-10-CM

## 2020-10-05 DIAGNOSIS — Z1211 Encounter for screening for malignant neoplasm of colon: Secondary | ICD-10-CM

## 2020-10-05 DIAGNOSIS — E669 Obesity, unspecified: Secondary | ICD-10-CM | POA: Diagnosis not present

## 2020-10-05 MED ORDER — OMEPRAZOLE 20 MG PO CPDR: 20.0000 mg | DELAYED_RELEASE_CAPSULE | Freq: Every day | ORAL | 3 refills | Status: DC

## 2020-10-05 NOTE — Patient Instructions (Signed)
We have started you on the medication called omeprazole to help with acid reflux. Try to cut back on your portion sizes.  Follow-up with our clinical pharmacist in 2 weeks for repeat blood pressure check.  Please bring your home blood pressure readings with you.  Remember to take your medications before coming to that visit.  Gastroesophageal Reflux Disease, Adult  Gastroesophageal reflux (GER) happens when acid from the stomach flows up into the tube that connects the mouth and the stomach (esophagus). Normally, food travels down the esophagus and stays in the stomach to be digested. With GER, food and stomach acid sometimes move back up into the esophagus. You may have a disease called gastroesophageal reflux disease (GERD) if the reflux:  Happens often.  Causes frequent or very bad symptoms.  Causes problems such as damage to the esophagus. When this happens, the esophagus becomes sore and swollen. Over time, GERD can make small holes (ulcers) in the lining of the esophagus. What are the causes? This condition is caused by a problem with the muscle between the esophagus and the stomach. When this muscle is weak or not normal, it does not close properly to keep food and acid from coming back up from the stomach. The muscle can be weak because of:  Tobacco use.  Pregnancy.  Having a certain type of hernia (hiatal hernia).  Alcohol use.  Certain foods and drinks, such as coffee, chocolate, onions, and peppermint. What increases the risk?  Being overweight.  Having a disease that affects your connective tissue.  Taking NSAIDs, such a ibuprofen. What are the signs or symptoms?  Heartburn.  Difficult or painful swallowing.  The feeling of having a lump in the throat.  A bitter taste in the mouth.  Bad breath.  Having a lot of saliva.  Having an upset or bloated stomach.  Burping.  Chest pain. Different conditions can cause chest pain. Make sure you see your doctor  if you have chest pain.  Shortness of breath or wheezing.  A long-term cough or a cough at night.  Wearing away of the surface of teeth (tooth enamel).  Weight loss. How is this treated?  Making changes to your diet.  Taking medicine.  Having surgery. Treatment will depend on how bad your symptoms are. Follow these instructions at home: Eating and drinking  Follow a diet as told by your doctor. You may need to avoid foods and drinks such as: ? Coffee and tea, with or without caffeine. ? Drinks that contain alcohol. ? Energy drinks and sports drinks. ? Bubbly (carbonated) drinks or sodas. ? Chocolate and cocoa. ? Peppermint and mint flavorings. ? Garlic and onions. ? Horseradish. ? Spicy and acidic foods. These include peppers, chili powder, curry powder, vinegar, hot sauces, and BBQ sauce. ? Citrus fruit juices and citrus fruits, such as oranges, lemons, and limes. ? Tomato-based foods. These include red sauce, chili, salsa, and pizza with red sauce. ? Fried and fatty foods. These include donuts, french fries, potato chips, and high-fat dressings. ? High-fat meats. These include hot dogs, rib eye steak, sausage, ham, and bacon. ? High-fat dairy items, such as whole milk, butter, and cream cheese.  Eat small meals often. Avoid eating large meals.  Avoid drinking large amounts of liquid with your meals.  Avoid eating meals during the 2-3 hours before bedtime.  Avoid lying down right after you eat.  Do not exercise right after you eat.   Lifestyle  Do not smoke or use any  products that contain nicotine or tobacco. If you need help quitting, ask your doctor.  Try to lower your stress. If you need help doing this, ask your doctor.  If you are overweight, lose an amount of weight that is healthy for you. Ask your doctor about a safe weight loss goal.   General instructions  Pay attention to any changes in your symptoms.  Take over-the-counter and prescription  medicines only as told by your doctor.  Do not take aspirin, ibuprofen, or other NSAIDs unless your doctor says it is okay.  Wear loose clothes. Do not wear anything tight around your waist.  Raise (elevate) the head of your bed about 6 inches (15 cm). You may need to use a wedge to do this.  Avoid bending over if this makes your symptoms worse.  Keep all follow-up visits. Contact a doctor if:  You have new symptoms.  You lose weight and you do not know why.  You have trouble swallowing or it hurts to swallow.  You have wheezing or a cough that keeps happening.  You have a hoarse voice.  Your symptoms do not get better with treatment. Get help right away if:  You have sudden pain in your arms, neck, jaw, teeth, or back.  You suddenly feel sweaty, dizzy, or light-headed.  You have chest pain or shortness of breath.  You vomit and the vomit is green, yellow, or black, or it looks like blood or coffee grounds.  You faint.  Your poop (stool) is red, bloody, or black.  You cannot swallow, drink, or eat. These symptoms may represent a serious problem that is an emergency. Do not wait to see if the symptoms will go away. Get medical help right away. Call your local emergency services (911 in the [place] Do not drive yourself to the hospital. Summary  If a person has gastroesophageal reflux disease (GERD), food and stomach acid move back up into the esophagus and cause symptoms or problems such as damage to the esophagus.  Treatment will depend on how bad your symptoms are.  Follow a diet as told by your doctor.  Take all medicines only as told by your doctor. This information is not intended to replace advice given to you by your health care provider. Make sure you discuss any questions you have with your health care provider. Document Revised: 02/17/2020 Document Reviewed: 02/17/2020 Elsevier Patient Education  [place].

## 2020-10-05 NOTE — Progress Notes (Signed)
Patient ID: Stephen Jennings, male    DOB: 03/02/1956  MRN: [id number]  CC: Hypertension   Subjective: Stephen Jennings is a 65 y.o. male who presents for chronic ds management His concerns today include:  Patient with history of CAD(NSTEMIwith stent to LAD 03/2019), ICM with last EF 45-50%, former smoker, COPD, pre-DM 2, anemia (resolved on labs 10/21).   [id number] COVID booster but does not have card  HYPERTENSION/CAD Currently taking: see medication list.  Did not take meds as yet this a.m Med Adherence: [x] Yes    [] No Medication side effects: [] Yes    [x] No Adherence with salt restriction: [] Yes    [x] No Home Monitoring?: [x] Yes    [] No Monitoring Frequency: every morning and evening Home BP results range: 120s/70.  Highest has been 140/80.  SOB? [] Yes    [x] No.  No PND Chest Pain?: [] Yes    [x] No.  No recent use of SL Nitro Leg swelling?: [] Yes    [x] No Headaches?: [] Yes    [x] No Dizziness? [] Yes    [x] No Comments:  C/o acid reflux for past 2-3 wks. Eat meatloaf the other day with ketchup on it and "it tore my stomach up."  Heartburn also when he drinks coffee.  Better with drinking Ensure. Stays active working in his yard.  PreDM/Obesity:  Gained additional 6-8 lbs since last visit. Reports his eating habits are terrible; eating too large portions. Cut back from drinking 1 liter to just a 16 oz Pepsi a day Patient Active Problem List   Diagnosis Date Noted  . Chronic systolic heart failure ([place]) 10/05/2020  . Coronary artery disease of native artery of native heart with stable angina pectoris ([place]) 10/05/2020  . Essential hypertension 06/08/2020  . Obesity (BMI 30.0-34.9) 06/08/2020  . Snoring 11/28/2019  . Morning headache 11/28/2019  . Chronic daily headache 10/08/2019  . History of ST elevation myocardial infarction (STEMI) 05/08/2019  . Ischemic cardiomyopathy 05/08/2019  . 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  History of placement of stent in LAD coronary artery 05/08/2019     Current Outpatient Medications on File Prior to Visit  Medication Sig Dispense Refill  . Accu-Chek Softclix Lancets lancets Use to measure blood sugar twice a day 100 each 6  . aspirin 81 MG chewable tablet CHEW 1 TABLET BY MOUTH DAILY. 30 tablet 2  . atorvastatin (LIPITOR) 80 MG tablet TAKE 1 TABLET BY MOUTH DAILY AT 6 PM. 30 tablet 2  . Blood Glucose Monitoring Suppl (ACCU-CHEK GUIDE ME) w/Device KIT 1 kit by Does not apply route in the morning and at bedtime. Use to measure blood sugar twice a day 1 kit 0  . empagliflozin (JARDIANCE) 10 MG TABS tablet Take 10 mg by mouth daily before breakfast. 30 tablet 11  . eplerenone (INSPRA) 25 MG tablet Take 1 tablet (25 mg total) by mouth daily. 30 tablet 11  . fluticasone (FLONASE) 50 MCG/ACT nasal spray Place 1 spray into both nostrils daily as needed for allergies or rhinitis. 16 g 2  . glucose blood (ACCU-CHEK GUIDE) test strip Use to measure blood sugar twice a day 100 each 6  . Hyprom-Naphaz-Polysorb-Zn Sulf (CLEAR EYES COMPLETE) SOLN Apply 1 drop to eye 3 (three) times daily. 15 mL 1  . loratadine (CLARITIN) 10 MG tablet Take 1 tablet (10 mg total) by mouth daily. 30 tablet 2  . losartan (COZAAR)  50 MG tablet Take 0.5 tablets (25 mg total) by mouth daily. 45 tablet 1  . metoprolol succinate (TOPROL-XL) 25 MG 24 hr tablet 1/2 tab PO daily 45 tablet 3  . nitroGLYCERIN (NITROSTAT) 0.4 MG SL tablet Place 1 tablet (0.4 mg total) under the tongue every 5 (five) minutes x 3 doses as needed for chest pain. 25 tablet 1  . ticagrelor (BRILINTA) 90 MG TABS tablet Take 1 tablet (90 mg total) by mouth 2 (two) times daily. 60 tablet 4  . traZODone (DESYREL) 50 MG tablet 1/2 to 1 tab PO QHS 30 tablet 2   No current facility-administered medications on file prior to visit.    Allergies  Allergen Reactions  . Spironolactone     gynecomastia  . Metformin And Related Anxiety and  Palpitations    Social History   Socioeconomic History  . Marital status: Married    Spouse name: Not on file  . Number of children: 1  . Years of education: 8  . Highest education level: 8th grade  Occupational History  . Not on file  Tobacco Use  . Smoking status: Former Smoker    Quit date: 04/08/2019    Years since quitting: 1.4  . Smokeless tobacco: Never Used  Vaping Use  . Vaping Use: Never used  Substance and Sexual Activity  . Alcohol use: No  . Drug use: No  . Sexual activity: Not on file  Other Topics Concern  . Not on file  Social History Narrative   Lives with wife   Right handed   Caffeine: about 2 cups of coffee every morning, no soda   Social Determinants of Health   Financial Resource Strain: Not on file  Food Insecurity: Not on file  Transportation Needs: Not on file  Physical Activity: Not on file  Stress: Not on file  Social Connections: Not on file  Intimate Partner Violence: Not on file    Family History  Problem Relation Age of Onset  . Hypertension Mother   . Heart failure Mother   . Headache Neg Hx   . Migraines Neg Hx     Past Surgical History:  Procedure Laterality Date  . CARDIAC CATHETERIZATION    . CORONARY/GRAFT ACUTE MI REVASCULARIZATION N/A 03/31/2019   Procedure: Coronary/Graft Acute MI Revascularization;  Surgeon: Troy Sine, MD;  Location: [facility];  Service: Cardiovascular;  Laterality: N/A;  . ICD IMPLANT N/A 04/15/2019   Procedure: ICD IMPLANT;  Surgeon: Constance Haw, MD;  Location: [facility];  Service: Cardiovascular;  Laterality: N/A;  . LEFT HEART CATH AND CORONARY ANGIOGRAPHY N/A 03/31/2019   Procedure: LEFT HEART CATH AND CORONARY ANGIOGRAPHY;  Surgeon: Troy Sine, MD;  Location: [facility];  Service: Cardiovascular;  Laterality: N/A;  . NO PAST SURGERIES    . RIGHT HEART CATH N/A 03/31/2019   Procedure: RIGHT HEART CATH;  Surgeon: Troy Sine, MD;  Location: [facility];  Service: Cardiovascular;  Laterality: N/A;  . VENTRICULAR ASSIST DEVICE INSERTION N/A 03/31/2019   Procedure: VENTRICULAR ASSIST DEVICE INSERTION;  Surgeon: Troy Sine, MD;  Location: [facility];  Service: Cardiovascular;  Laterality: N/A;    ROS: Review of Systems Negative except as stated above  PHYSICAL EXAM: BP (!) 160/84   Pulse 66   Resp 16   Wt 208 lb (94.3 kg)   SpO2 99%   BMI 31.63 kg/m   Wt Readings from Last 3  Encounters:  10/05/20 208 lb (94.3 kg)  06/08/20 200 lb (90.7 kg)  11/28/19 185 lb (83.9 kg)   Physical Exam General appearance - alert, well appearing, and in no distress Mental status - normal mood, behavior, speech, dress, motor activity, and thought processes Neck - supple, no significant adenopathy Chest - clear to auscultation, no wheezes, rales or rhonchi, symmetric air entry Heart - normal rate, regular rhythm, normal S1, S2, no murmurs, rubs, clicks or gallops Extremities - peripheral pulses normal, no pedal edema, no clubbing or cyanosis  CMP Latest Ref Rng & Units 06/08/2020 09/23/2019 08/21/2019  Glucose 65 - 99 mg/dL 120(H) 117(H) 116(H)  BUN 8 - 27 mg/dL _0 Creatinine 0.76 - 1.27 mg/dL 0.94 0.96 0.99  Sodium 134 - 144 mmol/L 140 141 140  Potassium 3.5 - 5.2 mmol/L 4.7 4.5 4.6  Chloride 96 - 106 mmol/L 103 106 104  CO2 20 - 29 mmol/L _1 Calcium 8.6 - 10.2 mg/dL 9.3 9.2 9.5  Total Protein 6.5 - 8.1 g/dL - 6.6 -  Total Bilirubin 0.3 - 1.2 mg/dL - 0.7 -  Alkaline Phos 38 - 126 U/L - 75 -  AST 15 - 41 U/L - 30 -  ALT 0 - 44 U/L - 42 -   Lipid Panel     Component Value Date/Time   CHOL 113 09/23/2019 1140   TRIG 79 09/23/2019 1140   HDL 35 (L) 09/23/2019 1140   CHOLHDL 3.2 09/23/2019 1140   VLDL 16 09/23/2019 1140   LDLCALC 62 09/23/2019 1140    CBC    Component Value Date/Time   WBC 6.3 06/08/2020 1003   WBC 6.4 06/20/2019 1222   RBC 5.01 06/08/2020 1003   RBC 4.38 06/20/2019 1222   HGB 14.7  06/08/2020 1003   HCT 44.8 06/08/2020 1003   PLT 276 06/08/2020 1003   MCV 89 06/08/2020 1003   MCH 29.3 06/08/2020 1003   MCH 29.2 06/20/2019 1222   MCHC 32.8 06/08/2020 1003   MCHC 32.0 06/20/2019 1222   RDW 13.1 06/08/2020 1003   LYMPHSABS 0.9 04/09/2019 0615   MONOABS 0.9 04/09/2019 0615   EOSABS 0.3 04/09/2019 0615   BASOSABS 0.0 04/09/2019 0615    ASSESSMENT AND PLAN: 1. Essential hypertension Not at goal.  Patient has not taken meds as yet for today.  He is encouraged to do so as soon as he returns home.  Follow-up with clinical pharmacist in 2 weeks for blood pressure recheck.  2. Prediabetes 3. Obesity (BMI 30.0-34.9) Dietary counseling given.  Encouraged him to cut back on portion sizes.  Commended him on cutting back on drinking regular sodas.  I have challenged him to cut back even more to no more than 112 ounce cans a day.  4. Chronic systolic heart failure (HCC) Clinically stable and compensated. - Ambulatory referral to Cardiology  5. Coronary artery disease of native artery of native heart with stable angina pectoris ([place]) Clinically stable.  Due for follow-up with cardiology - Lipid panel - Hepatic function panel - Ambulatory referral to Cardiology  6. Gastroesophageal reflux disease without esophagitis GERD precautions discussed and encouraged.  In particular I went over foods to avoid including foods that are tomato based, spicy foods, juices etc.  Advised to eat his last meal at least 2 hours before laying down and to sleep with his head slightly elevated. - omeprazole (PRILOSEC) 20 MG capsule; Take 1 capsule (20 mg total) by mouth daily.  Dispense:  30 capsule; Refill: 3  7. Screening for colon cancer - Fecal occult blood, imunochemical([facility])    Patient was given the opportunity to ask questions.  Patient verbalized understanding of the plan and was able to repeat key elements of the plan.   Orders Placed This Encounter  Procedures  .  Fecal occult blood, imunochemical([facility])  . Lipid panel  . Hepatic function panel  . Ambulatory referral to Cardiology     Requested Prescriptions   Signed Prescriptions Disp Refills  . omeprazole (PRILOSEC) 20 MG capsule 30 capsule 3    Sig: Take 1 capsule (20 mg total) by mouth daily.    Return in about 4 months (around 02/02/2021) for Give appt with [facility] in 2 weeks for BP recheck.  Karle Plumber, MD, FACP

## 2020-10-06 LAB — LIPID PANEL
Chol/HDL Ratio: 4.3 ratio (ref 0.0–5.0)
Cholesterol, Total: 213 mg/dL — ABNORMAL HIGH (ref 100–199)
HDL: 49 mg/dL (ref >39)
LDL Chol Calc (NIH): 144 mg/dL — ABNORMAL HIGH (ref 0–99)
Triglycerides: 111 mg/dL (ref 0–149)
VLDL Cholesterol Cal: 20 mg/dL (ref 5–40)

## 2020-10-06 LAB — HEPATIC FUNCTION PANEL
ALT: 28 IU/L (ref 0–44)
AST: 25 IU/L (ref 0–40)
Albumin: 4.8 g/dL (ref 3.8–4.8)
Alkaline Phosphatase: 112 IU/L (ref 44–121)
Bilirubin Total: 0.3 mg/dL (ref 0.0–1.2)
Bilirubin, Direct: 0.12 mg/dL (ref 0.00–0.40)
Total Protein: 7.2 g/dL (ref 6.0–8.5)

## 2020-10-07 ENCOUNTER — Other Ambulatory Visit: Payer: Self-pay | Admitting: Internal Medicine

## 2020-10-07 MED ORDER — ATORVASTATIN CALCIUM 80 MG PO TABS
ORAL_TABLET | ORAL | 6 refills | Status: DC
Start: 2020-10-07 — End: 2020-11-16

## 2020-10-07 NOTE — Progress Notes (Signed)
Let patient know that his LDL cholesterol is 144 with goal being less than 70.  This increases his risk for having another heart attack or stroke.  Looks like he may be out of the atorvastatin which is the medication to lower his cholesterol.  I have sent a refill on it to his pharmacy.  Liver function tests normal.

## 2020-10-11 ENCOUNTER — Other Ambulatory Visit: Payer: Self-pay | Admitting: Internal Medicine

## 2020-10-11 DIAGNOSIS — G4709 Other insomnia: Secondary | ICD-10-CM

## 2020-10-19 ENCOUNTER — Other Ambulatory Visit: Payer: Self-pay

## 2020-10-19 ENCOUNTER — Ambulatory Visit: Payer: Medicaid Other | Attending: Internal Medicine | Admitting: Pharmacist

## 2020-10-19 ENCOUNTER — Encounter: Payer: Self-pay | Admitting: Pharmacist

## 2020-10-19 VITALS — BP 156/83 | HR 69

## 2020-10-19 DIAGNOSIS — I10 Essential (primary) hypertension: Secondary | ICD-10-CM

## 2020-10-19 NOTE — Progress Notes (Signed)
     S:    PCP: Dr. Wynetta Emery   Patient arrives in good spirits. Presents to the clinic for hypertension evaluation, counseling, and management. Patient was referred and last seen by Primary Care Provider on 10/05/2020.   Today, pt admits to medication non-compliance. He stopped taking his metoprolol. He takes this on an empty stomach and experiences side effects. He is not taking his losartan consistently. He is taking his eplerenone.   Current BP Medications include:  Eplerenone 25 mg daily, Losartan 25 mg daily, Toprol 12.5 mg XL daily  Dietary habits include: compliant with salt restriction; denies drinking excess caffeine (~1-1.5 cups of coffee/day) Exercise habits include: none outside of work Family / Social history:  - FHx: HTN, HF, migraines - Tobacco: former smoker  - Alcohol: denies use  O:  Vitals:   10/19/20 0950  BP: (!) 156/83  Pulse: 69    Home BP readings:  Has not taken since seeing Dr. Wynetta Emery   Last 3 Office BP readings: BP Readings from Last 3 Encounters:  10/19/20 (!) 156/83  10/05/20 (!) 160/84  07/06/20 (!) 162/94   BMET    Component Value Date/Time   NA 140 06/08/2020 1003   K 4.7 06/08/2020 1003   CL 103 06/08/2020 1003   CO2 24 06/08/2020 1003   GLUCOSE 120 (H) 06/08/2020 1003   GLUCOSE 117 (H) 09/23/2019 1140   BUN 12 06/08/2020 1003   CREATININE 0.94 06/08/2020 1003   CALCIUM 9.3 06/08/2020 1003   GFRNONAA 85 06/08/2020 1003   GFRAA 99 06/08/2020 1003   Renal function: CrCl cannot be calculated (Patient's most recent lab result is older than the maximum 21 days allowed.).  Clinical ASCVD: No  The ASCVD Risk score Mikey Bussing DC Jr., et al., 2013) failed to calculate for the following reasons:   The patient has a prior MI or stroke diagnosis  A/P: Hypertension longstanding currently uncontrolled on current medications. BP Goal = < 130/80 mmHg. Medication adherence: pt is taking his eplerenone but not metoprolol or losartan.  -Restarted  losartan, Toprol. Advised pt to take his Toprol and losartan after dinner (before bedtime). -Continue eplerenone.  -Counseled on lifestyle modifications for blood pressure control including reduced dietary sodium, increased exercise, adequate sleep.  Results reviewed and written information provided.   Total time in face-to-face counseling 15 minutes.  F/U Clinic Visit in 1 month.   Benard Halsted, PharmD, [place], [place] [phone]

## 2020-10-21 ENCOUNTER — Ambulatory Visit: Payer: Self-pay

## 2020-10-22 LAB — CUP PACEART REMOTE DEVICE CHECK
Battery Remaining Longevity: 83 mo
Battery Remaining Percentage: 84 %
Battery Voltage: 3.02 V
Brady Statistic AP VP Percent: 1 %
Brady Statistic AP VS Percent: 3.3 %
Brady Statistic AS VP Percent: 1 %
Brady Statistic AS VS Percent: 97 %
Brady Statistic RA Percent Paced: 3.1 %
Brady Statistic RV Percent Paced: 1 %
Date Time Interrogation Session: 20220302040017
HighPow Impedance: 83 Ohm
HighPow Impedance: 83 Ohm
Implantable Lead Implant Date: 20200824
Implantable Lead Implant Date: 20200824
Implantable Lead Location: 753859
Implantable Lead Location: 753860
Implantable Pulse Generator Implant Date: 20200824
Lead Channel Impedance Value: 360 Ohm
Lead Channel Impedance Value: 390 Ohm
Lead Channel Pacing Threshold Amplitude: 0.75 V
Lead Channel Pacing Threshold Amplitude: 0.75 V
Lead Channel Pacing Threshold Pulse Width: 0.5 ms
Lead Channel Pacing Threshold Pulse Width: 0.5 ms
Lead Channel Sensing Intrinsic Amplitude: 1 mV
Lead Channel Sensing Intrinsic Amplitude: 11.9 mV
Lead Channel Setting Pacing Amplitude: 2 V
Lead Channel Setting Pacing Amplitude: 2.5 V
Lead Channel Setting Pacing Pulse Width: 0.5 ms
Lead Channel Setting Sensing Sensitivity: 0.5 mV
Pulse Gen Serial Number: 1300954

## 2020-11-16 ENCOUNTER — Encounter: Payer: Self-pay | Admitting: Pharmacist

## 2020-11-16 ENCOUNTER — Ambulatory Visit: Payer: Medicaid Other | Attending: Internal Medicine | Admitting: Pharmacist

## 2020-11-16 ENCOUNTER — Other Ambulatory Visit: Payer: Self-pay

## 2020-11-16 VITALS — BP 159/91 | HR 64

## 2020-11-16 DIAGNOSIS — I10 Essential (primary) hypertension: Secondary | ICD-10-CM

## 2020-11-16 MED ORDER — LOSARTAN POTASSIUM 50 MG PO TABS: 50.0000 mg | ORAL_TABLET | Freq: Every day | ORAL | 3 refills | Status: DC

## 2020-11-16 MED ORDER — ROSUVASTATIN CALCIUM 40 MG PO TABS: 40.0000 mg | ORAL_TABLET | Freq: Every day | ORAL | 2 refills | Status: DC

## 2020-11-16 NOTE — Progress Notes (Signed)
S:    PCP: Dr. Wynetta Emery   Patient arrives in good spirits. Presents to the clinic for hypertension evaluation, counseling, and management. Patient was referred and last seen by Primary Care Provider on 10/05/2020. I saw him on 10/19/2020 and he admitted to medication non-compliance. His blood pressure was elevated at that visit. I instructed him to continue and try to adhere to his medications. No changes were made. Of note, I've been hesitant to adjust/add medications given patient's history of intolerance. Since I've seen him, he's endorsed acid reflux symptoms and "a jittery feeling" that he attributes to his medications. "This didn't start until I took all of these medications". Additionally, he endorses muscle weakness and fatigue since starting his Lipitor 80mg . Finally, he endorses dyspnea and excessive bruising that he attributes to his Brilinta.   Today, pt tells me he is taking his medications as instructed since his last visit with me. He denies the "jiterry feeling" currently but continues to experience reflux symptoms and states that taking his medications with food does not help this. He continues to endorse muscle weakness and fatigue d/t the atorvastatin, and he shows me multiple, small bruises that are healing on his arms and trunk.   Current BP Medications include:  Eplerenone 25 mg daily, Losartan 25 mg daily, Toprol 12.5 mg XL daily  Dietary habits include: compliant with salt restriction; denies drinking excess caffeine (~1-1.5 cups of coffee/day) Exercise habits include: none outside of work Family / Social history:  - FHx: HTN, HF, migraines - Tobacco: former smoker  - Alcohol: denies use  O:  Vitals:   11/16/20 0908  BP: (!) 159/91  Pulse: 64     Home BP readings:  Has not taken since seeing Dr. Wynetta Emery   Last 3 Office BP readings: BP Readings from Last 3 Encounters:  11/16/20 (!) 159/91  10/19/20 (!) 156/83  10/05/20 (!) 160/84   BMET    Component Value  Date/Time   NA 140 06/08/2020 1003   K 4.7 06/08/2020 1003   CL 103 06/08/2020 1003   CO2 24 06/08/2020 1003   GLUCOSE 120 (H) 06/08/2020 1003   GLUCOSE 117 (H) 09/23/2019 1140   BUN 12 06/08/2020 1003   CREATININE 0.94 06/08/2020 1003   CALCIUM 9.3 06/08/2020 1003   GFRNONAA 85 06/08/2020 1003   GFRAA 99 06/08/2020 1003   Renal function: CrCl cannot be calculated (Patient's most recent lab result is older than the maximum 21 days allowed.).  Clinical ASCVD: No  The ASCVD Risk score Mikey Bussing DC Jr., et al., 2013) failed to calculate for the following reasons:   The patient has a prior MI or stroke diagnosis  A/P: Hypertension longstanding currently uncontrolled on current medications. BP Goal = < 130/80 mmHg. Medication adherence reported since seeing me last visit. He continues to endorse multiple side effects attributed to several of his medications. He sees Cardiology in May. I have instructed him to discuss his antiplatelet therapy with them. For now, I have instructed him to increase his losartan dose to 50mg  daily. He will continue metoprolol and eplerenone at current doses. I recommend to stop atorvastatin and begin rosuvastatin. With rosuvastatin being more hydrophilic, it may be less likely to cause myopathy. Pt is agreeable to this. He requests to check a lipid panel today. I shared with him that it may be a little early to see a big different if he just started the Lipitor ~1 month ago, but we can check today.  -Increase  dose of losartan to 50 mg daily.  -Stop atorvastatin.  -Start rosuvastatin 40 mg daily.  -Continue Toprol-XL, eplerenone at current doses.  -Counseled on lifestyle modifications for blood pressure control including reduced dietary sodium, increased exercise, adequate sleep. -BMP -Lipid  Results reviewed and written information provided.   Total time in face-to-face counseling 15 minutes.  F/U Clinic Visit in 1 month.   Benard Halsted, PharmD, [facility] [place],  [place] [phone]

## 2020-11-17 LAB — BASIC METABOLIC PANEL
BUN/Creatinine Ratio: 12 (ref 10–24)
BUN: 12 mg/dL (ref 8–27)
CO2: 19 mmol/L — ABNORMAL LOW (ref 20–29)
Calcium: 9.5 mg/dL (ref 8.6–10.2)
Chloride: 110 mmol/L — ABNORMAL HIGH (ref 96–106)
Creatinine, Ser: 1.04 mg/dL (ref 0.76–1.27)
Glucose: 143 mg/dL — ABNORMAL HIGH (ref 65–99)
Potassium: 4.3 mmol/L (ref 3.5–5.2)
Sodium: 149 mmol/L — ABNORMAL HIGH (ref 134–144)
eGFR: 80 mL/min/{1.73_m2} (ref >59)

## 2020-11-17 LAB — LIPID PANEL
Chol/HDL Ratio: 3.2 ratio (ref 0.0–5.0)
Cholesterol, Total: 117 mg/dL (ref 100–199)
HDL: 37 mg/dL — ABNORMAL LOW (ref >39)
LDL Chol Calc (NIH): 66 mg/dL (ref 0–99)
Triglycerides: 66 mg/dL (ref 0–149)
VLDL Cholesterol Cal: 14 mg/dL (ref 5–40)

## 2020-11-18 ENCOUNTER — Telehealth: Payer: Self-pay

## 2020-11-18 NOTE — Telephone Encounter (Signed)
Contacted pt to go over lab results pt didn't answer lvm  

## 2020-11-18 NOTE — Progress Notes (Signed)
Let patient know that his kidney function looks good.  His sodium level is mildly elevated.  Make sure that he is drinking adequate fluids during the day to keep himself hydrated especially with him being on the [place].  Cholesterol levels are good.

## 2020-11-29 ENCOUNTER — Other Ambulatory Visit: Payer: Self-pay | Admitting: Internal Medicine

## 2020-11-29 DIAGNOSIS — J3089 Other allergic rhinitis: Secondary | ICD-10-CM

## 2020-11-29 NOTE — Telephone Encounter (Signed)
Requested Prescriptions  Pending Prescriptions Disp Refills  . EQ LORATADINE 10 MG tablet [Pharmacy Med Name: EQ Loratadine 10 MG Oral Tablet] 30 tablet 0    Sig: Take 1 tablet by mouth once daily     Ear, Nose, and Throat:  Antihistamines Passed - 11/29/2020  4:48 PM      Passed - Valid encounter within last 12 months    Recent Outpatient Visits          1 week ago Essential hypertension   [place], Jarome Matin, RPH-CPP   1 month ago Essential hypertension   [place], Jarome Matin, RPH-CPP   1 month ago Essential hypertension   [place], MD   5 months ago Need for influenza vaccination   [place], Jarome Matin, RPH-CPP   5 months ago Coronary artery disease of native artery of native heart with stable angina pectoris [facility])   [place], Deborah B, MD      Future Appointments            In 2 days Daisy [place]oss[place], Jarome Matin, [place]   In 2 months Wynetta Emery, Dalbert Batman, MD [place]

## 2020-12-01 ENCOUNTER — Ambulatory Visit: Payer: Medicaid Other | Attending: Internal Medicine | Admitting: Pharmacist

## 2020-12-01 ENCOUNTER — Encounter: Payer: Self-pay | Admitting: Pharmacist

## 2020-12-01 ENCOUNTER — Other Ambulatory Visit: Payer: Self-pay

## 2020-12-01 ENCOUNTER — Telehealth: Payer: Self-pay | Admitting: Internal Medicine

## 2020-12-01 ENCOUNTER — Ambulatory Visit: Payer: Medicaid Other | Admitting: Physician Assistant

## 2020-12-01 ENCOUNTER — Ambulatory Visit (HOSPITAL_COMMUNITY)
Admission: RE | Admit: 2020-12-01 | Discharge: 2020-12-01 | Disposition: A | Discharge status: Home / Self Care | Payer: Medicaid Other | Source: Ambulatory Visit | Attending: Physician Assistant | Admitting: Physician Assistant

## 2020-12-01 ENCOUNTER — Telehealth (HOSPITAL_COMMUNITY): Payer: Self-pay | Admitting: *Deleted

## 2020-12-01 VITALS — BP 172/105

## 2020-12-01 VITALS — BP 166/118 | HR 89 | Temp 97.5°F | Resp 18 | Ht 68.0 in | Wt 200.0 lb

## 2020-12-01 DIAGNOSIS — R059 Cough, unspecified: Secondary | ICD-10-CM | POA: Diagnosis not present

## 2020-12-01 DIAGNOSIS — J069 Acute upper respiratory infection, unspecified: Secondary | ICD-10-CM | POA: Diagnosis not present

## 2020-12-01 DIAGNOSIS — I10 Essential (primary) hypertension: Secondary | ICD-10-CM

## 2020-12-01 DIAGNOSIS — R0602 Shortness of breath: Secondary | ICD-10-CM

## 2020-12-01 DIAGNOSIS — I25118 Atherosclerotic heart disease of native coronary artery with other forms of angina pectoris: Secondary | ICD-10-CM | POA: Diagnosis not present

## 2020-12-01 DIAGNOSIS — I5022 Chronic systolic (congestive) heart failure: Secondary | ICD-10-CM

## 2020-12-01 DIAGNOSIS — Z87891 Personal history of nicotine dependence: Secondary | ICD-10-CM | POA: Diagnosis not present

## 2020-12-01 DIAGNOSIS — J439 Emphysema, unspecified: Secondary | ICD-10-CM | POA: Diagnosis not present

## 2020-12-01 LAB — POC COVID19 BINAXNOW: SARS Coronavirus 2 Ag: NEGATIVE

## 2020-12-01 MED ORDER — ALBUTEROL SULFATE HFA 108 (90 BASE) MCG/ACT IN AERS: 2.0000 | INHALATION_SPRAY | Freq: Four times a day (QID) | RESPIRATORY_TRACT | 0 refills | Status: DC | PRN

## 2020-12-01 MED ORDER — AZITHROMYCIN 250 MG PO TABS: ORAL_TABLET | ORAL | 0 refills | Status: DC

## 2020-12-01 NOTE — Progress Notes (Signed)
Patient has taken medication and has not eaten today. Patient reports he may be having a side effect from the brilinta. Patient reports SOB beginning 2 weeks which limits him walking to the restroom. Patient has taken medication at night.

## 2020-12-01 NOTE — Patient Instructions (Addendum)
You are going to take azithromycin, 2 tabs today, then 1 tab once daily until prescription is complete.  I sent a prescription for an albuterol inhaler, you can take 2 puffs every 6 hours as needed to help with the shortness of breath.  We will call you with your chest x-ray and lab results as soon as they are available, and we will discuss the next steps at that time.  I hope that you feel better soon  Kennieth Rad, [name] Physician Assistant [place] [email or web address]    Heart Failure Exacerbation  Heart failure is a condition in which the heart has trouble pumping blood. This may mean that the heart cannot pump enough blood out to the body or that the heart does not fill up with enough blood. When this happens, parts of the body do not get the blood and oxygen they need to function properly. This can cause symptoms such as breathing problems, tiredness (fatigue), swelling, and confusion. Heart failure exacerbation refers to heart failure symptoms that get worse. The symptoms may get worse suddenly or develop slowly over time. Heart failure exacerbation is a serious medical problem that should be treated right away. What are the causes? A heart failure exacerbation can be triggered by:  Not taking your heart failure medicines correctly.  Infections.  Eating an unhealthy diet or a diet that is high in salt (sodium).  Drinking too much fluid.  Drinking alcohol.  Using drugs, such as cocaine or methamphetamine.  Not exercising. Other causes include:  Other heart conditions such as an irregular heart rhythm (arrhythmia).  Worsening heart valve function.  Low blood counts (anemia).  Other medical problems, such as kidney failure, thyroid problems, or diabetes mellitus. Sometimes the cause of the exacerbation is not known. What are the signs or symptoms? When heart failure symptoms suddenly or slowly get worse, this  may be a sign of heart failure exacerbation. Symptoms of heart failure include:  Shortness of breath during activity or exercise.  A cough that does not go away.  Swelling of the legs, ankles, feet, or abdomen.  Losing or gaining weight for no reason.  Trouble breathing when lying down.  Increased heart rate or irregular heartbeat.  Fatigue.  Feeling light-headed, dizzy, or close to fainting.  Nausea or lack of appetite. How is this diagnosed? This condition is diagnosed based on:  Your symptoms and medical history.  A physical exam. You may also have tests, including:  Electrocardiogram (ECG). This test measures the electrical activity of your heart.  Echocardiogram. This test uses sound waves to take a picture of your heart to see how well it works.  Blood tests.  Imaging tests, such as: ? Chest X-ray. ? MRI. ? Ultrasound.  Stress test. This test examines how well your heart functions while you exercise on a treadmill or exercise bike. If you cannot exercise, medicines may be used to increase your heartbeat in place of exercise.  Cardiac catheterization. During this test, a thin, flexible tube (catheter) is inserted into a blood vessel and threaded up to your heart. This test allows your health care provider to check the arteries that lead to your heart (coronary arteries).  Right heart catheterization. During this test, the pressure in your heart is measured. How is this treated? This condition may be treated by:  Adjusting your heart medicines.  Maintaining a healthy lifestyle. This includes: ? Eating a heart-healthy diet that is low in sodium. ? Not using products that contain  nicotine or tobacco. ? Regular exercise. ? Monitoring your fluid intake. ? Monitoring your weight and reporting changes to your health care provider. ? Not using alcohol or drugs.  Treating sleep apnea, if you have this condition.  Surgery. This may include: ? Placing a pacemaker  to improve heart function (cardiac resynchronization therapy). ? Implanting a device that can correct heart rhythm problems (implantable cardioverter defibrillator). ? Implanting a pulmonary arterial pressure monitor to monitor your fluid balance. ? Connecting a device to your heart to help it pump blood (ventricular assist device). ? Heart transplant. Follow these instructions at home: Medicines  Take over-the-counter and prescription medicines only as told by your health care provider.  Do not stop taking your medicines or change the amount you take. If you are having problems or side effects from your medicines, talk to your health care provider.  If you are having difficulty paying for your medicines, contact a social worker or your clinic. There are many programs to assist with medicine costs.  Talk to your health care provider before starting any new medicines or supplements.  Make sure your health care provider and pharmacist have a list of all the medicines you are taking. Eating and drinking  Avoid drinking alcohol.  Eat a heart-healthy diet as told by your health care provider. This includes: ? Plenty of fruits and vegetables. ? Lean proteins. ? Low-fat dairy. ? Whole grains. ? Foods that are low in sodium.   Activity  Exercise regularly as told by your health care provider. Balance exercise with rest.  Ask your health care provider what activities are safe for you. This includes sexual activity, exercise, and daily tasks at home or work.   Lifestyle  Do not use any products that contain nicotine or tobacco. These products include cigarettes, chewing tobacco, and vaping devices, such as e-cigarettes. If you need help quitting, ask your health care provider.  Maintain a healthy weight. Ask your health care provider what weight is healthy for you.  Consider joining a patient support group. This can help with emotional problems you may have, such as stress and  anxiety.  Do not use drugs. General instructions  Stay up to date with vaccines. Talk to your health care provider about flu and pneumonia vaccines.  Keep a list of medicines that you are taking. This may help in emergency situations.  Keep all follow-up visits. This is important. Contact a health care provider if:  You have questions about your medicines or you miss a dose.  You feel anxious, depressed, or stressed.  You develop swelling in your feet, ankles, legs, or abdomen.  You develop a cough.  You have a fever.  You have trouble sleeping.  You gain 2-3 lb (1-1.4 kg) in 24 hours or 5 lb (2.3 kg) in a week. Get help right away if:  You have chest pain or pressure.  You have shortness of breath while resting.  You have severe fatigue.  You are confused.  You have severe dizziness.  You have a rapid or irregular heartbeat.  You have nausea or you vomit.  You have a cough that is worse at night or you cannot lie flat.  You have severe depression or sadness. These symptoms may represent a serious problem that is an emergency. Do not wait to see if the symptoms will go away. Get medical help right away. Call your local emergency services (911 in the [place] Do not drive yourself to the hospital. Summary  When  heart failure symptoms get worse, it is called heart failure exacerbation.  Common causes of this condition include taking medicines incorrectly, infections, and drinking alcohol.  This condition may be treated by adjusting medicines, maintaining a healthy lifestyle, or surgery.  Do not stop taking your medicines or change the amount you take. If you are having problems or side effects from your medicines, talk to your health care provider. This information is not intended to replace advice given to you by your health care provider. Make sure you discuss any questions you have with your health care provider. Document Revised: 02/29/2020 Document Reviewed:  02/29/2020 Elsevier Patient Education  [place].

## 2020-12-01 NOTE — Progress Notes (Signed)
Established Patient Office Visit  Subjective:  Patient ID: Stephen Jennings, male    DOB: August 10, 1956  Age: 65 y.o. MRN: [id number]  CC:  Chief Complaint  Patient presents with  . Shortness of Breath    HPI Stephen Jennings reports that [facility]  During their visit he did complain that he has been having severe shortness of breath.  Patient was encouraged to report to emergency department the patient declined and wanted to be seen by the mobile unit.  Note from clinical pharmacist:   Patient arrives in good spirits. Presents to the clinic for hypertension evaluation, counseling, and management. Patient was referred and last seen by Primary Care Provider on 10/05/2020. I saw him on 11/16/20 and he was complaining of SOB, reflux symptoms, and muscle pain. At this visit, losartan was increased to 50 mg daily and atorvastatin was changed to rosuvastatin.  Today, pt tells me he is taking his medications as instructed since his last visit. Pt endorses severe SOB when just walking to the car or from the bedroom to the bathroom. Reports coughing up green stuff and "hurts all over" due to coughing so much. Of note, pulse ox today is 98%, HR WNL. No LE edema present on my exam.    HPI from [facility] visit Patient gives a history that up until 2 weeks ago he was able to do all of his normal daily activities without any restriction.  Reports that he then started having difficulty breathing.  Reports that he would have to sit down and catch his breath after just getting up to use the bathroom.  Reports then 1 week ago he started having a productive cough with green phlegm.  Reports that his cough is causing him to have chest pain, back pain, and difficulty sleeping.  Denies sick contacts.  Has been taking a daily allergy medication and using nasal saline without much relief.  Denies edema.  Reports that he sleeps with one pillow, states it  has been difficult for him to sleep due to the shortness of breath.  Reports significant smoking history of 40 years, stopped smoking in August 2020 after having a heart attack.   Past Medical History:  Diagnosis Date  . Cardiogenic shock ([place])   . CHF (congestive heart failure) ([place])   . Coronary artery disease   . Diabetes mellitus without complication ([place])   . Hyperlipidemia   . Medical history non-contributory     Past Surgical History:  Procedure Laterality Date  . CARDIAC CATHETERIZATION    . CORONARY/GRAFT ACUTE MI REVASCULARIZATION N/A 03/31/2019   Procedure: Coronary/Graft Acute MI Revascularization;  Surgeon: Troy Sine, MD;  Location: [place] [facility];  Service: Cardiovascular;  Laterality: N/A;  . ICD IMPLANT N/A 04/15/2019   Procedure: ICD IMPLANT;  Surgeon: Constance Haw, MD;  Location: [place] [facility];  Service: Cardiovascular;  Laterality: N/A;  . LEFT HEART CATH AND CORONARY ANGIOGRAPHY N/A 03/31/2019   Procedure: LEFT HEART CATH AND CORONARY ANGIOGRAPHY;  Surgeon: Troy Sine, MD;  Location: [place] [facility];  Service: Cardiovascular;  Laterality: N/A;  . NO PAST SURGERIES    . RIGHT HEART CATH N/A 03/31/2019   Procedure: RIGHT HEART CATH;  Surgeon: Troy Sine, MD;  Location: [place] [facility];  Service: Cardiovascular;  Laterality: N/A;  . VENTRICULAR ASSIST DEVICE INSERTION N/A 03/31/2019   Procedure: VENTRICULAR ASSIST DEVICE INSERTION;  Surgeon: Shelva Majestic  A, MD;  Location: [place] [facility];  Service: Cardiovascular;  Laterality: N/A;    Family History  Problem Relation Age of Onset  . Hypertension Mother   . Heart failure Mother   . Headache Neg Hx   . Migraines Neg Hx     Social History   Socioeconomic History  . Marital status: Married    Spouse name: Not on file  . Number of children: 1  . Years of education: 8  . Highest education level: 8th grade  Occupational History  . Not on file  Tobacco Use  . Smoking status:  Former Smoker    Quit date: 04/08/2019    Years since quitting: 1.6  . Smokeless tobacco: Never Used  Vaping Use  . Vaping Use: Never used  Substance and Sexual Activity  . Alcohol use: No  . Drug use: No  . Sexual activity: Not Currently  Other Topics Concern  . Not on file  Social History Narrative   Lives with wife   Right handed   Caffeine: about 2 cups of coffee every morning, no soda   Social Determinants of Health   Financial Resource Strain: Not on file  Food Insecurity: Not on file  Transportation Needs: Not on file  Physical Activity: Not on file  Stress: Not on file  Social Connections: Not on file  Intimate Partner Violence: Not on file    Outpatient Medications Prior to Visit  Medication Sig Dispense Refill  . Accu-Chek Softclix Lancets lancets Use to measure blood sugar twice a day 100 each 6  . aspirin 81 MG chewable tablet CHEW 1 TABLET BY MOUTH DAILY. 30 tablet 2  . Blood Glucose Monitoring Suppl (ACCU-CHEK GUIDE ME) w/Device KIT 1 kit by Does not apply route in the morning and at bedtime. Use to measure blood sugar twice a day 1 kit 0  . empagliflozin (JARDIANCE) 10 MG TABS tablet Take 10 mg by mouth daily before breakfast. 30 tablet 11  . eplerenone (INSPRA) 25 MG tablet Take 1 tablet (25 mg total) by mouth daily. 30 tablet 11  . EQ LORATADINE 10 MG tablet Take 1 tablet by mouth once daily 30 tablet 0  . fluticasone (FLONASE) 50 MCG/ACT nasal spray Place 1 spray into both nostrils daily as needed for allergies or rhinitis. 16 g 2  . glucose blood (ACCU-CHEK GUIDE) test strip Use to measure blood sugar twice a day 100 each 6  . Hyprom-Naphaz-Polysorb-Zn Sulf (CLEAR EYES COMPLETE) SOLN Apply 1 drop to eye 3 (three) times daily. 15 mL 1  . losartan (COZAAR) 50 MG tablet Take 1 tablet (50 mg total) by mouth daily. 90 tablet 3  . metoprolol succinate (TOPROL-XL) 25 MG 24 hr tablet 1/2 tab PO daily 45 tablet 3  . nitroGLYCERIN (NITROSTAT) 0.4 MG SL tablet Place 1  tablet (0.4 mg total) under the tongue every 5 (five) minutes x 3 doses as needed for chest pain. 25 tablet 1  . omeprazole (PRILOSEC) 20 MG capsule Take 1 capsule (20 mg total) by mouth daily. 30 capsule 3  . rosuvastatin (CRESTOR) 40 MG tablet Take 1 tablet (40 mg total) by mouth daily. 30 tablet 2  . ticagrelor (BRILINTA) 90 MG TABS tablet Take 1 tablet (90 mg total) by mouth 2 (two) times daily. 60 tablet 4  . traZODone (DESYREL) 50 MG tablet TAKE 1/2-1 TABLET BY MOUTH AT BEDTIME 30 tablet 2   No facility-administered medications prior to visit.    Allergies  Allergen  Reactions  . Spironolactone     gynecomastia  . Metformin And Related Anxiety and Palpitations    ROS Review of Systems  Constitutional: Negative for chills and fever.  HENT: Positive for congestion. Negative for postnasal drip, sinus pressure and sore throat.   Eyes: Negative.   Respiratory: Positive for cough and shortness of breath. Negative for wheezing.   Cardiovascular: Negative for chest pain.  Gastrointestinal: Negative.   Endocrine: Negative.   Genitourinary: Negative.   Musculoskeletal: Negative.   Skin: Negative.   Allergic/Immunologic: Negative.   Neurological: Negative.   Hematological: Negative.   Psychiatric/Behavioral: Negative.       Objective:    Physical Exam Vitals and nursing note reviewed.  Constitutional:      General: He is not in acute distress.    Appearance: He is well-developed. He is not ill-appearing.  HENT:     Head: Normocephalic and atraumatic.     Right Ear: External ear normal.     Left Ear: External ear normal.     Nose:     Right Turbinates: Swollen.     Left Turbinates: Swollen.     Right Sinus: No maxillary sinus tenderness or frontal sinus tenderness.     Left Sinus: No maxillary sinus tenderness or frontal sinus tenderness.     Mouth/Throat:     Mouth: Mucous membranes are moist.     Pharynx: Oropharynx is clear.  Eyes:     Extraocular Movements:  Extraocular movements intact.     Conjunctiva/sclera: Conjunctivae normal.     Pupils: Pupils are equal, round, and reactive to light.  Cardiovascular:     Rate and Rhythm: Normal rate and regular rhythm.     Pulses: Normal pulses.     Heart sounds: Normal heart sounds.  Pulmonary:     Effort: Pulmonary effort is normal.     Breath sounds: Examination of the right-upper field reveals wheezing. Examination of the left-upper field reveals wheezing. Examination of the right-lower field reveals wheezing. Examination of the left-lower field reveals wheezing. Wheezing present.  Musculoskeletal:        General: Normal range of motion.     Cervical back: Normal range of motion.  Skin:    General: Skin is warm and dry.  Neurological:     General: No focal deficit present.     Mental Status: He is alert and oriented to person, place, and time.  Psychiatric:        Mood and Affect: Mood normal.        Behavior: Behavior normal.        Thought Content: Thought content normal.        Judgment: Judgment normal.     BP (!) 166/118 (BP Location: Left Arm, Patient Position: Sitting, Cuff Size: Large)   Pulse 89   Temp (!) 97.5 F (36.4 C) (Oral)   Resp 18   Ht 5' 8"  (1.727 m)   Wt 200 lb (90.7 kg)   SpO2 93%   BMI 30.41 kg/m  Wt Readings from Last 3 Encounters:  12/01/20 200 lb (90.7 kg)  10/05/20 208 lb (94.3 kg)  06/08/20 200 lb (90.7 kg)     Health Maintenance Due  Topic Date Due  . FOOT EXAM  Never done  . OPHTHALMOLOGY EXAM  Never done  . COLONOSCOPY (Pts 45-19yr Insurance coverage will need to be confirmed)  Never done  . COLON CANCER SCREENING ANNUAL FOBT  Never done    There are no preventive care  reminders to display for this patient.  Lab Results  Component Value Date   TSH 0.568 10/18/2019   Lab Results  Component Value Date   WBC 6.3 06/08/2020   HGB 14.7 06/08/2020   HCT 44.8 06/08/2020   MCV 89 06/08/2020   PLT 276 06/08/2020   Lab Results  Component  Value Date   NA 149 (H) 11/16/2020   K 4.3 11/16/2020   CO2 19 (L) 11/16/2020   GLUCOSE 143 (H) 11/16/2020   BUN 12 11/16/2020   CREATININE 1.04 11/16/2020   BILITOT 0.3 10/05/2020   ALKPHOS 112 10/05/2020   AST 25 10/05/2020   ALT 28 10/05/2020   PROT 7.2 10/05/2020   ALBUMIN 4.8 10/05/2020   CALCIUM 9.5 11/16/2020   ANIONGAP 8 09/23/2019   Lab Results  Component Value Date   CHOL 117 11/16/2020   Lab Results  Component Value Date   HDL 37 (L) 11/16/2020   Lab Results  Component Value Date   LDLCALC 66 11/16/2020   Lab Results  Component Value Date   TRIG 66 11/16/2020   Lab Results  Component Value Date   CHOLHDL 3.2 11/16/2020   Lab Results  Component Value Date   HGBA1C 6.2 (H) 06/08/2020      Assessment & Plan:   Problem List Items Addressed This Visit      Cardiovascular and Mediastinum   Essential hypertension   Chronic systolic heart failure (HCC)   Relevant Orders   Brain natriuretic peptide   DG Chest 2 View   ECHOCARDIOGRAM COMPLETE   Coronary artery disease of native artery of native heart with stable angina pectoris (HCC)     Respiratory   Upper respiratory tract infection   Relevant Medications   azithromycin (ZITHROMAX) 250 MG tablet   Other Relevant Orders   POC COVID-19 (Completed)     Other   SOB (shortness of breath) - Primary   Relevant Medications   albuterol (VENTOLIN HFA) 108 (90 Base) MCG/ACT inhaler   Other Relevant Orders   Brain natriuretic peptide   DG Chest 2 View   ECHOCARDIOGRAM COMPLETE    Other Visit Diagnoses    History of tobacco abuse       Relevant Medications   albuterol (VENTOLIN HFA) 108 (90 Base) MCG/ACT inhaler    1. SOB (shortness of breath) Rapid Covid testing negative.  heart failure versus COPD exacerbation, trial albuterol inhaler, azithromycin, chest x-ray ordered.  Red flags given for prompt reevaluation  Previous chest x-ray:   FINDINGS: New LEFT subclavian AICD leads project at RIGHT  atrium and RIGHT.  Normal heart size, mediastinal contours, and pulmonary vascularity.  Emphysematous changes and minimal central peribronchial thickening consistent with COPD.  No pulmonary infiltrate, pleural effusion or pneumothorax.  Bones demineralized.  IMPRESSION: New LEFT subclavian AICD without pneumothorax.  COPD changes without infiltrate.   Electronically Signed   By: Lavonia Dana M.D.   On: 04/16/2019 09:03  - Brain natriuretic peptide - DG Chest 2 View; Future - ECHOCARDIOGRAM COMPLETE; Future - albuterol (VENTOLIN HFA) 108 (90 Base) MCG/ACT inhaler; Inhale 2 puffs into the lungs every 6 (six) hours as needed for wheezing or shortness of breath.  Dispense: 8 g; Refill: 0  2. Chronic systolic heart failure [facility]) Patient encouraged to keep upcoming appointment with cardiology at the beginning of May. - Brain natriuretic peptide - DG Chest 2 View; Future - ECHOCARDIOGRAM COMPLETE; Future  3. History of tobacco abuse  - albuterol (VENTOLIN HFA) 108 (90 Base)  MCG/ACT inhaler; Inhale 2 puffs into the lungs every 6 (six) hours as needed for wheezing or shortness of breath.  Dispense: 8 g; Refill: 0  4. Upper respiratory tract infection, unspecified type  - POC COVID-19 - azithromycin (ZITHROMAX) 250 MG tablet; Take 2 tabs PO day 1, then take 1 tab PO once daily  Dispense: 6 tablet; Refill: 0  5. Essential hypertension Patient has upcoming appointment with cardiology for further evaluation  6. Coronary artery disease of native artery of native heart with stable angina pectoris ([place])   I have reviewed the patient's medical history (PMH, PSH, Social History, Family History, Medications, and allergies) , and have been updated if relevant. I spent 31 minutes reviewing chart and  face to face time with patient.     Meds ordered this encounter  Medications  . azithromycin (ZITHROMAX) 250 MG tablet    Sig: Take 2 tabs PO day 1, then take 1 tab PO once  daily    Dispense:  6 tablet    Refill:  0    Order Specific Question:   Supervising Provider    Answer:   Asencion Noble E [[id number]]  . albuterol (VENTOLIN HFA) 108 (90 Base) MCG/ACT inhaler    Sig: Inhale 2 puffs into the lungs every 6 (six) hours as needed for wheezing or shortness of breath.    Dispense:  8 g    Refill:  0    Order Specific Question:   Supervising Provider    Answer:   [name] [[id number]]    Follow-up: Return if symptoms worsen or fail to improve.    Loraine Grip [name], [name]

## 2020-12-01 NOTE — Telephone Encounter (Addendum)
Pt called to advise the heart doctor has no appts today.  He said they think he has a "cold" and that is something his pcp should take care of. Pt advised of Lurena Joiner message   would recommend for him to leave a message for the nurse at the heart clinic to see what they want him to do. If they can't get back to him, I recommend go to UC/ED for increased shortness of breath, DUE, and productive cough since he's a HF pt. Pt verbalized understand.  Pt states he is going to go to the mobile unit.Marland Kitchen

## 2020-12-01 NOTE — Progress Notes (Addendum)
     S:    PCP: Dr. Wynetta Emery   Patient arrives in good spirits. Presents to the clinic for hypertension evaluation, counseling, and management. Patient was referred and last seen by Primary Care Provider on 10/05/2020. I saw him on 11/16/20 and he was complaining of SOB, reflux symptoms, and muscle pain. At this visit, losartan was increased to 50 mg daily and atorvastatin was changed to rosuvastatin.  Today, pt tells me he is taking his medications as instructed since his last visit. Pt endorses severe SOB when just walking to the car or from the bedroom to the bathroom. Reports coughing up green stuff and "hurts all over" due to coughing so much. Of note, pulse ox today is 98%, HR WNL. No LE edema present on my exam.   Current BP Medications include:  Eplerenone 25 mg daily, Losartan 50 mg daily, Toprol 12.5 mg XL daily  Dietary habits include: compliant with salt restriction; denies drinking excess caffeine (~1-1.5 cups of coffee/day) Exercise habits include: none outside of work Family / Social history:  - FHx: HTN, HF, migraines - Tobacco: former smoker  - Alcohol: denies use  O:  Vitals:   12/01/20 0900  BP: (!) 172/105    Home BP readings:  Has not taken since seeing Dr. Wynetta Emery   Last 3 Office BP readings: BP Readings from Last 3 Encounters:  12/01/20 (!) 172/105  11/16/20 (!) 159/91  10/19/20 (!) 156/83   BMET    Component Value Date/Time   NA 149 (H) 11/16/2020 0912   K 4.3 11/16/2020 0912   CL 110 (H) 11/16/2020 0912   CO2 19 (L) 11/16/2020 0912   GLUCOSE 143 (H) 11/16/2020 0912   GLUCOSE 117 (H) 09/23/2019 1140   BUN 12 11/16/2020 0912   CREATININE 1.04 11/16/2020 0912   CALCIUM 9.5 11/16/2020 0912   GFRNONAA 85 06/08/2020 1003   GFRAA 99 06/08/2020 1003   Renal function: CrCl cannot be calculated (Unknown ideal weight.).  Clinical ASCVD: No  The ASCVD Risk score Mikey Bussing DC Jr., et al., 2013) failed to calculate for the following reasons:   The patient has  a prior MI or stroke diagnosis  A/P: Hypertension longstanding currently uncontrolled on current medications. BP Goal = < 130/80 mmHg. Medication adherence reported since seeing me last visit. He continues to endorse multiple side effects attributed to several of his medications. Pt does have heart failure so severe SOB and coughing up phelgm could be due to a HF exacerbation. We instructed him to contact his cardiologist immediately (provided him with the number) and made no changes to his regimen today due to his condition. We also provided with instructions to seek emergency medical attention should his symptoms progress or worsen.  -Continue losartan 50 mg daily.  -Continue rosuvastatin 40 mg daily.  -Continue Toprol-XL, eplerenone at current doses.  -Counseled on lifestyle modifications for blood pressure control including reduced dietary sodium, increased exercise, adequate sleep.  Results reviewed and written information provided.   Total time in face-to-face counseling 15 minutes.  F/U Clinic Visit in 1 month.   Benard Halsted, PharmD, [place], [place]  Cheree Ditto, PharmD Candidate [employer] of 2024

## 2020-12-01 NOTE — Telephone Encounter (Signed)
Pt called the triage line today stating he was seen at his PCP and was told to call here to get an appointment today. Pt said he was short of breath and thought he had pneumonia. Pt said he has been coughing up green stuff and his back hurts when he coughs. I asked pt if he had any swelling patient began to raise his voice and said "no I need to see my cardiologist". I told him I needed to ask him a few more questions because we are completely booked today and Dr.Bensimhon's next available is in June.   Pt started to yell that he was told he needed to be seen today and asked how is Dr.Bensimhon's next available in June if he has an appointment next month. I explained that pt has an appt next month because that was previously scheduled. I told patient I needed to get as much information as I can to see if we can work him in if it is a true cardiac issue. Pt continued to yell (I couldn't understand what he was saying) and then asked if he needed to get another cardiologist. I apologized for any inconvenience and continued to attempt to triage patients symptoms I asked if he had congestion or a fever and he stopped me and told me his PCP did not do a chest xray or test him for Covid. He then asked me what if we didn't have any appointments and this was an emergency what we suggest he do I told him that in an emergency we would send him to the emergency room. Pt yelled that he was not going to the emergency room to waste his whole day. I told patient that I was not instructing him to go to the emergency room but I was answering his question. Pt began to talk to someone in the background and then told me he would get another cardiologist and hung up the phone. Adline Potter made aware.

## 2020-12-02 DIAGNOSIS — Z87891 Personal history of nicotine dependence: Secondary | ICD-10-CM | POA: Insufficient documentation

## 2020-12-02 DIAGNOSIS — J069 Acute upper respiratory infection, unspecified: Secondary | ICD-10-CM | POA: Insufficient documentation

## 2020-12-02 DIAGNOSIS — R0602 Shortness of breath: Secondary | ICD-10-CM | POA: Insufficient documentation

## 2020-12-02 LAB — BRAIN NATRIURETIC PEPTIDE: BNP: 90.9 pg/mL (ref 0.0–100.0)

## 2020-12-03 ENCOUNTER — Other Ambulatory Visit: Payer: Self-pay

## 2020-12-03 ENCOUNTER — Telehealth: Payer: Medicaid Other | Admitting: Physician Assistant

## 2020-12-03 ENCOUNTER — Telehealth: Payer: Self-pay | Admitting: *Deleted

## 2020-12-03 DIAGNOSIS — J439 Emphysema, unspecified: Secondary | ICD-10-CM

## 2020-12-03 NOTE — Telephone Encounter (Signed)
Medical Assistant left message on patient's home and cell voicemail. Voicemail states to give a call back to [place] with [facility] at [phone]. Patient needs virtual with [facility] today at 12:20 if available.

## 2020-12-03 NOTE — Progress Notes (Signed)
Patient is aware of speaking with provider for results and next step.

## 2020-12-03 NOTE — Progress Notes (Signed)
Established Patient Office Visit  Subjective:  Patient ID: Stephen Jennings, male    DOB: May 30, 1956  Age: 65 y.o. MRN: [id number]  CC:  Chief Complaint  Patient presents with  . Results   Virtual Visit via Telephone Note  I connected with Kathleen Argue on 12/03/20 at 12:20 PM EDT by telephone and verified that I am speaking with the correct person using two identifiers.  Location: Patient: Home Provider: [facility]    I discussed the limitations, risks, security and privacy concerns of performing an evaluation and management service by telephone and the availability of in person appointments. I also discussed with the patient that there may be a patient responsible charge related to this service. The patient expressed understanding and agreed to proceed.   History of Present Illness:  Stephen Jennings reports that he has been using the albuterol inhaler 3 times a day  and taking the antibiotic regimen.  Reports that he is still having some shortness of breath, but endorses that  he has seen a great improvement.  Reports his cough is improved as well.    Observations/Objective: Medical history and current medications reviewed, no physical exam completed   Past Medical History:  Diagnosis Date  . Cardiogenic shock ([place])   . CHF (congestive heart failure) ([place])   . Coronary artery disease   . Diabetes mellitus without complication ([place])   . Hyperlipidemia   . Medical history non-contributory     Past Surgical History:  Procedure Laterality Date  . CARDIAC CATHETERIZATION    . CORONARY/GRAFT ACUTE MI REVASCULARIZATION N/A 03/31/2019   Procedure: Coronary/Graft Acute MI Revascularization;  Surgeon: Troy Sine, MD;  Location: [facility];  Service: Cardiovascular;  Laterality: N/A;  . ICD IMPLANT N/A 04/15/2019   Procedure: ICD IMPLANT;  Surgeon: Constance Haw, MD;  Location: [facility];  Service: Cardiovascular;  Laterality: N/A;   . LEFT HEART CATH AND CORONARY ANGIOGRAPHY N/A 03/31/2019   Procedure: LEFT HEART CATH AND CORONARY ANGIOGRAPHY;  Surgeon: Troy Sine, MD;  Location: [place] [facility];  Service: Cardiovascular;  Laterality: N/A;  . NO PAST SURGERIES    . RIGHT HEART CATH N/A 03/31/2019   Procedure: RIGHT HEART CATH;  Surgeon: Troy Sine, MD;  Location: [place] [facility];  Service: Cardiovascular;  Laterality: N/A;  . VENTRICULAR ASSIST DEVICE INSERTION N/A 03/31/2019   Procedure: VENTRICULAR ASSIST DEVICE INSERTION;  Surgeon: Troy Sine, MD;  Location: [place] [facility];  Service: Cardiovascular;  Laterality: N/A;    Family History  Problem Relation Age of Onset  . Hypertension Mother   . Heart failure Mother   . Headache Neg Hx   . Migraines Neg Hx     Social History   Socioeconomic History  . Marital status: Married    Spouse name: Not on file  . Number of children: 1  . Years of education: 8  . Highest education level: 8th grade  Occupational History  . Not on file  Tobacco Use  . Smoking status: Former Smoker    Quit date: 04/08/2019    Years since quitting: 1.6  . Smokeless tobacco: Never Used  Vaping Use  . Vaping Use: Never used  Substance and Sexual Activity  . Alcohol use: No  . Drug use: No  . Sexual activity: Not Currently  Other Topics Concern  . Not on file  Social History Narrative   Lives with wife   Right  handed   Caffeine: about 2 cups of coffee every morning, no soda   Social Determinants of Health   Financial Resource Strain: Not on file  Food Insecurity: Not on file  Transportation Needs: Not on file  Physical Activity: Not on file  Stress: Not on file  Social Connections: Not on file  Intimate Partner Violence: Not on file    Outpatient Medications Prior to Visit  Medication Sig Dispense Refill  . Accu-Chek Softclix Lancets lancets Use to measure blood sugar twice a day 100 each 6  . albuterol (VENTOLIN HFA) 108 (90 Base) MCG/ACT inhaler  Inhale 2 puffs into the lungs every 6 (six) hours as needed for wheezing or shortness of breath. 8 g 0  . aspirin 81 MG chewable tablet CHEW 1 TABLET BY MOUTH DAILY. 30 tablet 2  . azithromycin (ZITHROMAX) 250 MG tablet Take 2 tabs PO day 1, then take 1 tab PO once daily 6 tablet 0  . Blood Glucose Monitoring Suppl (ACCU-CHEK GUIDE ME) w/Device KIT 1 kit by Does not apply route in the morning and at bedtime. Use to measure blood sugar twice a day 1 kit 0  . empagliflozin (JARDIANCE) 10 MG TABS tablet Take 10 mg by mouth daily before breakfast. 30 tablet 11  . eplerenone (INSPRA) 25 MG tablet Take 1 tablet (25 mg total) by mouth daily. 30 tablet 11  . EQ LORATADINE 10 MG tablet Take 1 tablet by mouth once daily 30 tablet 0  . fluticasone (FLONASE) 50 MCG/ACT nasal spray Place 1 spray into both nostrils daily as needed for allergies or rhinitis. 16 g 2  . glucose blood (ACCU-CHEK GUIDE) test strip Use to measure blood sugar twice a day 100 each 6  . Hyprom-Naphaz-Polysorb-Zn Sulf (CLEAR EYES COMPLETE) SOLN Apply 1 drop to eye 3 (three) times daily. 15 mL 1  . losartan (COZAAR) 50 MG tablet Take 1 tablet (50 mg total) by mouth daily. 90 tablet 3  . metoprolol succinate (TOPROL-XL) 25 MG 24 hr tablet 1/2 tab PO daily 45 tablet 3  . nitroGLYCERIN (NITROSTAT) 0.4 MG SL tablet Place 1 tablet (0.4 mg total) under the tongue every 5 (five) minutes x 3 doses as needed for chest pain. 25 tablet 1  . omeprazole (PRILOSEC) 20 MG capsule Take 1 capsule (20 mg total) by mouth daily. 30 capsule 3  . rosuvastatin (CRESTOR) 40 MG tablet Take 1 tablet (40 mg total) by mouth daily. 30 tablet 2  . ticagrelor (BRILINTA) 90 MG TABS tablet Take 1 tablet (90 mg total) by mouth 2 (two) times daily. 60 tablet 4  . traZODone (DESYREL) 50 MG tablet TAKE 1/2-1 TABLET BY MOUTH AT BEDTIME 30 tablet 2   No facility-administered medications prior to visit.    Allergies  Allergen Reactions  . Spironolactone     gynecomastia   . Metformin And Related Anxiety and Palpitations    ROS Review of Systems  Constitutional: Negative for chills, fatigue and fever.  HENT: Positive for congestion. Negative for sinus pressure, sore throat and trouble swallowing.   Eyes: Negative.   Respiratory: Positive for cough, shortness of breath and wheezing.   Cardiovascular: Negative for chest pain.  Gastrointestinal: Negative.   Endocrine: Negative.   Genitourinary: Negative.   Musculoskeletal: Negative.   Skin: Negative.   Allergic/Immunologic: Negative.   Neurological: Negative.   Hematological: Negative.   Psychiatric/Behavioral: Negative.       Objective:     There were no vitals taken for this visit.  Wt Readings from Last 3 Encounters:  12/01/20 200 lb (90.7 kg)  10/05/20 208 lb (94.3 kg)  06/08/20 200 lb (90.7 kg)     Health Maintenance Due  Topic Date Due  . FOOT EXAM  Never done  . OPHTHALMOLOGY EXAM  Never done  . COLONOSCOPY (Pts 45-71yr Insurance coverage will need to be confirmed)  Never done  . COLON CANCER SCREENING ANNUAL FOBT  Never done    There are no preventive care reminders to display for this patient.  Lab Results  Component Value Date   TSH 0.568 10/18/2019   Lab Results  Component Value Date   WBC 6.3 06/08/2020   HGB 14.7 06/08/2020   HCT 44.8 06/08/2020   MCV 89 06/08/2020   PLT 276 06/08/2020   Lab Results  Component Value Date   NA 149 (H) 11/16/2020   K 4.3 11/16/2020   CO2 19 (L) 11/16/2020   GLUCOSE 143 (H) 11/16/2020   BUN 12 11/16/2020   CREATININE 1.04 11/16/2020   BILITOT 0.3 10/05/2020   ALKPHOS 112 10/05/2020   AST 25 10/05/2020   ALT 28 10/05/2020   PROT 7.2 10/05/2020   ALBUMIN 4.8 10/05/2020   CALCIUM 9.5 11/16/2020   ANIONGAP 8 09/23/2019   Lab Results  Component Value Date   CHOL 117 11/16/2020   Lab Results  Component Value Date   HDL 37 (L) 11/16/2020   Lab Results  Component Value Date   LDLCALC 66 11/16/2020   Lab Results   Component Value Date   TRIG 66 11/16/2020   Lab Results  Component Value Date   CHOLHDL 3.2 11/16/2020   Lab Results  Component Value Date   HGBA1C 6.2 (H) 06/08/2020      Assessment & Plan:   Problem List Items Addressed This Visit   None   Visit Diagnoses    Pulmonary emphysema, unspecified emphysema type ([place]    -  Primary   Relevant Orders   Ambulatory referral to Pulmonology      Assessment and Plan:  1. Pulmonary emphysema, unspecified emphysema type ([place] Lab results discussed with patient, BNP was within normal limits, chest x-ray: EXAM: CHEST - 2 VIEW  COMPARISON:  Chest x-ray 04/16/2019.  FINDINGS: Lung volumes are increased with emphysematous changes. Chronic scarring in the anterior aspect of the lungs noted on the lateral projection, similar to the prior examination. No consolidative airspace disease. No pleural effusions. No pneumothorax. No pulmonary nodule or mass noted. Pulmonary vasculature and the cardiomediastinal silhouette are within normal limits. Left-sided pacemaker device in place with lead tips projecting over the expected location of the right atrium and right ventricle.  IMPRESSION: 1. No radiographic evidence of acute cardiopulmonary disease. 2. Emphysema.   Electronically Signed   By: DVinnie LangtonM.D.   On: 12/02/2020 14:50  Patient has not had previous work-up for COPD, refer to pulmonology for further evaluation.  Red flags given for prompt reevaluation.   - Ambulatory referral to Pulmonology    Follow Up Instructions:    I discussed the assessment and treatment plan with the patient. The patient was provided an opportunity to ask questions and all were answered. The patient agreed with the plan and demonstrated an understanding of the instructions.   The patient was advised to call back or seek an in-person evaluation if the symptoms worsen or if the condition fails to improve as anticipated.  I provided   12  minutes of non-face-to-face time during this encounter.  No  AVS created, patient declines my chart   No orders of the defined types were placed in this encounter.   Follow-up: No follow-ups on file.    Loraine Grip [name], [name]

## 2020-12-07 ENCOUNTER — Other Ambulatory Visit: Payer: Self-pay | Admitting: Physician Assistant

## 2020-12-07 ENCOUNTER — Other Ambulatory Visit: Payer: Self-pay | Admitting: Internal Medicine

## 2020-12-07 DIAGNOSIS — J439 Emphysema, unspecified: Secondary | ICD-10-CM

## 2020-12-07 DIAGNOSIS — I25118 Atherosclerotic heart disease of native coronary artery with other forms of angina pectoris: Secondary | ICD-10-CM

## 2020-12-07 MED ORDER — SPIRIVA HANDIHALER 18 MCG IN CAPS
18.0000 ug | ORAL_CAPSULE | Freq: Every day | RESPIRATORY_TRACT | 2 refills | Status: DC
Start: 2020-12-07 — End: 2021-04-23

## 2020-12-07 MED ORDER — PREDNISONE 20 MG PO TABS: 40.0000 mg | ORAL_TABLET | Freq: Every day | ORAL | 0 refills | Status: DC

## 2020-12-07 NOTE — Telephone Encounter (Signed)
-----   Message from Kennieth Rad, [place] sent at 12/07/2020  2:33 PM EDT ----- Regarding: SOB sxs Please call patient and let him know that I would like him to start a daily inhaler, he will continue to use the albuterol as needed.  He will also take prednisone for 10 days.  Pulm referral is pending  Thanks

## 2020-12-07 NOTE — Telephone Encounter (Signed)
Medical Assistant left message on patient's home and cell voicemail. Voicemail states to give a call back to [place] with [facility] at [phone]. Patient is aware of provider sending in medications for his congestion in the chest and SOB.

## 2020-12-07 NOTE — Progress Notes (Signed)
Continued SOB sxs, Start Spiriva, Prednisone trial; pulm ref is pending

## 2020-12-07 NOTE — Telephone Encounter (Signed)
Requested medication (s) are due for refill today: yes  Requested medication (s) are on the active medication list: yes  Last refill:  06/08/20  Future visit scheduled: yes  Notes to clinic:  med not delegated to NT to RF   Requested Prescriptions  Pending Prescriptions Disp Refills   BRILINTA 90 MG TABS tablet [Pharmacy Med Name: Brilinta 90 MG Oral Tablet] 60 tablet 0    Sig: Take 1 tablet by mouth twice daily      Not Delegated - Hematology: Antiplatelets - ticagrelor Failed - 12/07/2020 12:56 PM      Failed - This refill cannot be delegated      Failed - HCT in normal range and within 180 days    Hematocrit  Date Value Ref Range Status  06/08/2020 44.8 37.5 - 51.0 % Final          Failed - HGB in normal range and within 180 days    Hemoglobin  Date Value Ref Range Status  06/08/2020 14.7 13.0 - 17.7 g/dL Final   Total hemoglobin  Date Value Ref Range Status  04/12/2019 9.7 (L) 12.0 - 16.0 g/dL Final          Failed - PLT in normal range and within 180 days    Platelets  Date Value Ref Range Status  06/08/2020 276 150 - 450 x10E3/uL Final          Passed - Cr in normal range and within 180 days    Creatinine, Ser  Date Value Ref Range Status  11/16/2020 1.04 0.76 - 1.27 mg/dL Final          Passed - Valid encounter within last 6 months    Recent Outpatient Visits           6 days ago Essential hypertension   [place], Jarome Matin, RPH-CPP   3 weeks ago Essential hypertension   [place], Stephen L, RPH-CPP   1 month ago Essential hypertension   [place], Jarome Matin, RPH-CPP   2 months ago Essential hypertension   [place], Deborah B, MD   6 months ago Need for influenza vaccination   [place], Jarome Matin, RPH-CPP       Future  Appointments             In 1 month Wynetta Emery, Dalbert Batman, MD [place]

## 2020-12-14 ENCOUNTER — Other Ambulatory Visit: Payer: Self-pay

## 2020-12-14 ENCOUNTER — Ambulatory Visit (HOSPITAL_COMMUNITY)
Admission: RE | Admit: 2020-12-14 | Discharge: 2020-12-14 | Disposition: A | Discharge status: Home / Self Care | Payer: Medicaid Other | Source: Ambulatory Visit | Attending: Physician Assistant | Admitting: Physician Assistant

## 2020-12-14 DIAGNOSIS — I340 Nonrheumatic mitral (valve) insufficiency: Secondary | ICD-10-CM | POA: Insufficient documentation

## 2020-12-14 DIAGNOSIS — I110 Hypertensive heart disease with heart failure: Secondary | ICD-10-CM | POA: Insufficient documentation

## 2020-12-14 DIAGNOSIS — Z95810 Presence of automatic (implantable) cardiac defibrillator: Secondary | ICD-10-CM | POA: Diagnosis not present

## 2020-12-14 DIAGNOSIS — I2510 Atherosclerotic heart disease of native coronary artery without angina pectoris: Secondary | ICD-10-CM | POA: Diagnosis not present

## 2020-12-14 DIAGNOSIS — I5022 Chronic systolic (congestive) heart failure: Secondary | ICD-10-CM | POA: Diagnosis not present

## 2020-12-14 DIAGNOSIS — R0602 Shortness of breath: Secondary | ICD-10-CM | POA: Diagnosis not present

## 2020-12-14 DIAGNOSIS — F17200 Nicotine dependence, unspecified, uncomplicated: Secondary | ICD-10-CM | POA: Insufficient documentation

## 2020-12-14 DIAGNOSIS — E119 Type 2 diabetes mellitus without complications: Secondary | ICD-10-CM | POA: Diagnosis not present

## 2020-12-14 LAB — ECHOCARDIOGRAM COMPLETE
Calc EF: 48.3 %
MV M vel: 5.94 m/s
MV Peak grad: 141.1 mmHg
Radius: 0.3 cm
S' Lateral: 3 cm
Single Plane A2C EF: 51.6 %
Single Plane A4C EF: 44.5 %

## 2020-12-14 NOTE — Progress Notes (Signed)
  Echocardiogram 2D Echocardiogram has been performed.  Bobbye [place] 12/14/2020, 11:13 AM

## 2020-12-21 ENCOUNTER — Telehealth: Payer: Self-pay | Admitting: *Deleted

## 2020-12-21 ENCOUNTER — Other Ambulatory Visit (HOSPITAL_COMMUNITY): Payer: Self-pay

## 2020-12-21 ENCOUNTER — Ambulatory Visit (HOSPITAL_COMMUNITY)
Admission: RE | Admit: 2020-12-21 | Discharge: 2020-12-21 | Disposition: A | Discharge status: Home / Self Care | Payer: Medicaid Other | Source: Ambulatory Visit | Attending: Internal Medicine | Admitting: Internal Medicine

## 2020-12-21 ENCOUNTER — Other Ambulatory Visit (HOSPITAL_COMMUNITY): Payer: Self-pay | Admitting: Internal Medicine

## 2020-12-21 ENCOUNTER — Other Ambulatory Visit: Payer: Self-pay

## 2020-12-21 ENCOUNTER — Encounter (HOSPITAL_COMMUNITY): Payer: Self-pay | Admitting: Internal Medicine

## 2020-12-21 VITALS — BP 150/80 | HR 88 | Wt 203.2 lb

## 2020-12-21 DIAGNOSIS — Z79899 Other long term (current) drug therapy: Secondary | ICD-10-CM | POA: Insufficient documentation

## 2020-12-21 DIAGNOSIS — Z8674 Personal history of sudden cardiac arrest: Secondary | ICD-10-CM | POA: Insufficient documentation

## 2020-12-21 DIAGNOSIS — Z8249 Family history of ischemic heart disease and other diseases of the circulatory system: Secondary | ICD-10-CM | POA: Diagnosis not present

## 2020-12-21 DIAGNOSIS — I252 Old myocardial infarction: Secondary | ICD-10-CM | POA: Diagnosis not present

## 2020-12-21 DIAGNOSIS — J449 Chronic obstructive pulmonary disease, unspecified: Secondary | ICD-10-CM | POA: Insufficient documentation

## 2020-12-21 DIAGNOSIS — Z955 Presence of coronary angioplasty implant and graft: Secondary | ICD-10-CM | POA: Insufficient documentation

## 2020-12-21 DIAGNOSIS — Z7982 Long term (current) use of aspirin: Secondary | ICD-10-CM | POA: Insufficient documentation

## 2020-12-21 DIAGNOSIS — I2510 Atherosclerotic heart disease of native coronary artery without angina pectoris: Secondary | ICD-10-CM | POA: Diagnosis not present

## 2020-12-21 DIAGNOSIS — I10 Essential (primary) hypertension: Secondary | ICD-10-CM

## 2020-12-21 DIAGNOSIS — I110 Hypertensive heart disease with heart failure: Secondary | ICD-10-CM | POA: Insufficient documentation

## 2020-12-21 DIAGNOSIS — I5022 Chronic systolic (congestive) heart failure: Secondary | ICD-10-CM

## 2020-12-21 DIAGNOSIS — E119 Type 2 diabetes mellitus without complications: Secondary | ICD-10-CM | POA: Insufficient documentation

## 2020-12-21 DIAGNOSIS — Z87891 Personal history of nicotine dependence: Secondary | ICD-10-CM | POA: Diagnosis not present

## 2020-12-21 LAB — BRAIN NATRIURETIC PEPTIDE: B Natriuretic Peptide: 40.6 pg/mL (ref 0.0–100.0)

## 2020-12-21 LAB — BASIC METABOLIC PANEL
Anion gap: 10 (ref 5–15)
BUN: 15 mg/dL (ref 8–23)
CO2: 24 mmol/L (ref 22–32)
Calcium: 9.3 mg/dL (ref 8.9–10.3)
Chloride: 101 mmol/L (ref 98–111)
Creatinine, Ser: 1.15 mg/dL (ref 0.61–1.24)
GFR, Estimated: >60 mL/min (ref >60)
Glucose, Bld: 166 mg/dL — ABNORMAL HIGH (ref 70–99)
Potassium: 4.6 mmol/L (ref 3.5–5.1)
Sodium: 135 mmol/L (ref 135–145)

## 2020-12-21 MED ORDER — ENTRESTO 49-51 MG PO TABS: 1.0000 | ORAL_TABLET | Freq: Two times a day (BID) | ORAL | 3 refills | Status: DC

## 2020-12-21 MED ORDER — POTASSIUM CHLORIDE CRYS ER 20 MEQ PO TBCR: 20.0000 meq | EXTENDED_RELEASE_TABLET | Freq: Every day | ORAL | 3 refills | Status: DC | PRN

## 2020-12-21 MED ORDER — FUROSEMIDE 40 MG PO TABS: 40.0000 mg | ORAL_TABLET | Freq: Every day | ORAL | 3 refills | Status: DC | PRN

## 2020-12-21 NOTE — Progress Notes (Signed)
Advanced Heart Failure Clinic Note   Referring Physician: PCP: Ladell Pier, MD PCP-Cardiologist: No primary care provider on file.   HPI:  Mr Habeeb is a 65 y.o. 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   Brought to ER 03/31/19 with respiratory failure/cardiac arrest. Intubated in ER. Initial ECG with anterior ST elevation. Cath by Dr. Claiborne Billings with 3v CAD and totally occluded prox LAD. LCX anomalous off RCA. Underwent PCI of LAD which was diffusely diseased vessel. Developed cardiogenic shock requiring pressors and mechanical support with impella.  03/31/2019 Echo 20-25%. Hospital course complicated by Torsades. EP consulted placed ICD prior to discharge.    Here for f/u. We have not seen him since 2/21  Says he is doing great. Doing anything he wants without CP or SOB. Doing landscaping. Had some SOB with Brilinta and stopped. Now better. BP running high. SBP 140-170. Not smoking. No edema, orthopnea or PND.   Echo 4/22 EF 55-60%   Echo 2/21  EF 45-50%     Past Medical History:  Diagnosis Date  . Cardiogenic shock ([place])   . CHF (congestive heart failure) ([place])   . Coronary artery disease   . Diabetes mellitus without complication ([place])   . Hyperlipidemia   . Medical history non-contributory     Current Outpatient Medications  Medication Sig Dispense Refill  . Accu-Chek Softclix Lancets lancets Use to measure blood sugar twice a day 100 each 6  . albuterol (VENTOLIN HFA) 108 (90 Base) MCG/ACT inhaler Inhale 2 puffs into the lungs every 6 (six) hours as needed for wheezing or shortness of breath. 8 g 0  . aspirin 81 MG chewable tablet CHEW 1 TABLET BY MOUTH DAILY. 30 tablet 2  . Blood Glucose Monitoring Suppl (ACCU-CHEK GUIDE ME) w/Device KIT 1 kit by Does not apply route in the morning and at bedtime. Use to measure blood sugar twice a day 1 kit 0  . empagliflozin (JARDIANCE) 10 MG TABS tablet Take 10 mg by mouth daily before breakfast. 30  tablet 11  . eplerenone (INSPRA) 25 MG tablet Take 1 tablet (25 mg total) by mouth daily. 30 tablet 11  . EQ LORATADINE 10 MG tablet Take 1 tablet by mouth once daily 30 tablet 0  . fluticasone (FLONASE) 50 MCG/ACT nasal spray Place 1 spray into both nostrils daily as needed for allergies or rhinitis. 16 g 2  . glucose blood (ACCU-CHEK GUIDE) test strip Use to measure blood sugar twice a day 100 each 6  . Hyprom-Naphaz-Polysorb-Zn Sulf (CLEAR EYES COMPLETE) SOLN Apply 1 drop to eye 3 (three) times daily. 15 mL 1  . losartan (COZAAR) 50 MG tablet Take 1 tablet (50 mg total) by mouth daily. 90 tablet 3  . metoprolol succinate (TOPROL-XL) 25 MG 24 hr tablet 1/2 tab PO daily 45 tablet 3  . nitroGLYCERIN (NITROSTAT) 0.4 MG SL tablet Place 1 tablet (0.4 mg total) under the tongue every 5 (five) minutes x 3 doses as needed for chest pain. 25 tablet 1  . omeprazole (PRILOSEC) 20 MG capsule Take 1 capsule (20 mg total) by mouth daily. 30 capsule 3  . rosuvastatin (CRESTOR) 40 MG tablet Take 1 tablet (40 mg total) by mouth daily. 30 tablet 2  . tiotropium (SPIRIVA HANDIHALER) 18 MCG inhalation capsule Place 1 capsule (18 mcg total) into inhaler and inhale daily. 30 capsule 2  . traZODone (DESYREL) 50 MG tablet TAKE 1/2-1 TABLET BY MOUTH AT BEDTIME 30 tablet  2   No current facility-administered medications for this encounter.    Allergies  Allergen Reactions  . Spironolactone     gynecomastia  . Metformin And Related Anxiety and Palpitations      Social History   Socioeconomic History  . Marital status: Married    Spouse name: Not on file  . Number of children: 1  . Years of education: 8  . Highest education level: 8th grade  Occupational History  . Not on file  Tobacco Use  . Smoking status: Former Smoker    Quit date: 04/08/2019    Years since quitting: 1.7  . Smokeless tobacco: Never Used  Vaping Use  . Vaping Use: Never used  Substance and Sexual Activity  . Alcohol use: No  .  Drug use: No  . Sexual activity: Not Currently  Other Topics Concern  . Not on file  Social History Narrative   Lives with wife   Right handed   Caffeine: about 2 cups of coffee every morning, no soda   Social Determinants of Health   Financial Resource Strain: Not on file  Food Insecurity: Not on file  Transportation Needs: Not on file  Physical Activity: Not on file  Stress: Not on file  Social Connections: Not on file  Intimate Partner Violence: Not on file      Family History  Problem Relation Age of Onset  . Hypertension Mother   . Heart failure Mother   . Headache Neg Hx   . Migraines Neg Hx     Vitals:   12/21/20 1435  BP: (!) 150/80  Pulse: 88  SpO2: 95%  Weight: 92.2 kg (203 lb 3.2 oz)     PHYSICAL EXAM: General:  Well appearing. No resp difficulty HEENT: normal Neck: supple. no JVD. Carotids 2+ bilat; no bruits. No lymphadenopathy or thryomegaly appreciated. Cor: PMI nondisplaced. Regular rate & rhythm. No rubs, gallops or murmurs. Lungs: decreased throughout  Abdomen: soft, nontender, nondistended. No hepatosplenomegaly. No bruits or masses. Good bowel sounds. Extremities: no cyanosis, clubbing, rash, edema Neuro: alert & orientedx3, cranial nerves grossly intact. moves all 4 extremities w/o difficulty. Affect pleasant   ECG: NSR 81 anteroseptal qs Personally reviewed   ASSESSMENT & PLAN:  1. Cardiac arrest/VT/torsades on 8/15 - Now s/p ICD - ICD interrogated personally in clinic. No VT or AF. AP 3.2% Personally reviewed   2. [place] systolic HF with recovered EF - s/p PCI/DES to LAD on 03/31/19 - 03/31/2019 Echo 20-25% - Echo 09/23/19: EF 45-50% - Echo 4/22 EF 55-60%  - NYHA I-II volume status ok - Continue eplernone 25 mg daily.  - BP up will switch losartan to Entresto 49/51 bid with elevated BP  - Continue toprol 12.5 mg (dose was reduced to decrease APacing can increase as tolerted) - Continue Jardiance 10  - ICD interrogation today: No  VT or AV. Activity level 8hrs/day(!). Volume status up and down. I suspect SOB last month not due to Brilinta but due to volume overload. Will switch losartan to Entresto. Will also give lasix 42m to use as needed just for those events.  - will f/u in 2 months - if doing well can graduate HF Clinic  3. CAD with acute anterior MI 8/9 - s/p PCI/DES to LAD. Diffusely diseased vessel - + residual CAD in RCA and anomalous LCX - Continue ASA.statin - No s/s ischemia - Continue jardiance  4. DM2 - On metformin & Jardiance 10   5. COPD - congratulated on  smoking cessation  6. HTN - BP up - Switch to Jeanie Sewer, MD 12/21/20

## 2020-12-21 NOTE — Telephone Encounter (Signed)
-----   Message from Kennieth Rad, [place] sent at 12/17/2020  9:38 AM EDT ----- Please call patient and encourage him to keep his follow-up appointment with cardiology on May 2 so they are able to review the echocardiogram with him.

## 2020-12-21 NOTE — Patient Instructions (Signed)
Stop Losartan  Start Entresto 49/51 mg Twice daily   Take Furosemide 40 mg AS NEEDED  Take Potassium 20 meq AS NEEDED  Your physician recommends that you schedule a follow-up appointment in: 2 months  If you have any questions or concerns before your next appointment please send [place] a message through [place] or call our office at [phone].    TO LEAVE A MESSAGE FOR THE NURSE SELECT OPTION 2, PLEASE LEAVE A MESSAGE INCLUDING: . YOUR NAME . DATE OF BIRTH . CALL BACK NUMBER . REASON FOR CALL**this is important as we prioritize the call backs  [place] AS LONG AS YOU CALL BEFORE 4:00 PM  At the [facility], you and your health needs are our priority. As part of our continuing mission to provide you with exceptional heart care, we have created designated Provider Care Teams. These Care Teams include your primary Cardiologist (physician) and Advanced Practice Providers (APPs- Physician Assistants and Nurse Practitioners) who all work together to provide you with the care you need, when you need it.   You may see any of the following providers on your designated Care Team at your next follow up: Marland Kitchen Dr [name] . Dr Loralie Champagne . Dr Vickki Muff . Darrick Grinder, NP . Lyda Jester, [place] . Audry Riles, PharmD   Please be sure to bring in all your medications bottles to every appointment.

## 2020-12-21 NOTE — Telephone Encounter (Signed)
Patient verified DOB Patient is aware of discussing ECHO with cardiology on today. Patient was getting ready for his appointment.

## 2020-12-21 NOTE — Addendum Note (Signed)
Encounter addended by: Scarlette Calico, RN on: 12/21/2020 3:36 PM  Actions taken: Diagnosis association updated, Medication long-term status modified, Pharmacy for encounter modified, Order list changed, Clinical Note Signed, Charge Capture section accepted

## 2020-12-22 ENCOUNTER — Telehealth (HOSPITAL_COMMUNITY): Payer: Self-pay | Admitting: Pharmacy Technician

## 2020-12-22 NOTE — Telephone Encounter (Signed)
Patient Advocate Encounter   Received notification from [facility] that prior authorization for Delene Loll is required.   PA submitted on CoverMyMeds Key [id number] Status is pending   Will continue to follow.

## 2020-12-22 NOTE — Telephone Encounter (Signed)
Advanced Heart Failure Patient Advocate Encounter  Prior Authorization for Stephen Jennings has been approved.    PA# [id number] Effective dates: 12/22/20 through 12/22/21  Charlann Boxer, CPhT

## 2021-01-19 ENCOUNTER — Other Ambulatory Visit: Payer: Self-pay

## 2021-01-19 ENCOUNTER — Ambulatory Visit: Payer: Medicaid Other | Admitting: Physician Assistant

## 2021-01-19 ENCOUNTER — Ambulatory Visit: Payer: Self-pay | Admitting: *Deleted

## 2021-01-19 ENCOUNTER — Other Ambulatory Visit: Payer: Self-pay | Admitting: Internal Medicine

## 2021-01-19 DIAGNOSIS — R0602 Shortness of breath: Secondary | ICD-10-CM

## 2021-01-19 DIAGNOSIS — J441 Chronic obstructive pulmonary disease with (acute) exacerbation: Secondary | ICD-10-CM | POA: Diagnosis not present

## 2021-01-19 DIAGNOSIS — J439 Emphysema, unspecified: Secondary | ICD-10-CM | POA: Diagnosis not present

## 2021-01-19 DIAGNOSIS — J3089 Other allergic rhinitis: Secondary | ICD-10-CM | POA: Diagnosis not present

## 2021-01-19 DIAGNOSIS — Z87891 Personal history of nicotine dependence: Secondary | ICD-10-CM

## 2021-01-19 MED ORDER — MONTELUKAST SODIUM 10 MG PO TABS: 10.0000 mg | ORAL_TABLET | Freq: Every day | ORAL | 3 refills | Status: DC

## 2021-01-19 MED ORDER — PREDNISONE 20 MG PO TABS: 40.0000 mg | ORAL_TABLET | Freq: Every day | ORAL | 0 refills | Status: AC

## 2021-01-19 MED ORDER — GUAIFENESIN ER 600 MG PO TB12: 600.0000 mg | ORAL_TABLET | Freq: Two times a day (BID) | ORAL | 2 refills | Status: DC

## 2021-01-19 MED ORDER — AZITHROMYCIN 250 MG PO TABS: ORAL_TABLET | ORAL | 0 refills | Status: DC

## 2021-01-19 MED ORDER — LORATADINE 10 MG PO TABS: 10.0000 mg | ORAL_TABLET | Freq: Every day | ORAL | 11 refills | Status: DC

## 2021-01-19 MED ORDER — ALBUTEROL SULFATE 0.63 MG/3ML IN NEBU: 1.0000 | INHALATION_SOLUTION | Freq: Four times a day (QID) | RESPIRATORY_TRACT | 12 refills | Status: DC | PRN

## 2021-01-19 NOTE — Patient Instructions (Signed)
For your COPD exacerbation, you will take azithromycin as well as a course of prednisone.  I have also sent a refill of your Claritin to the pharmacy.  I also encourage you to start Singulair at bedtime.  I also encourage you to keep Mucinex on hand.  I started a request for you to obtain a nebulizer so you are able to perform breathing treatments at home.  The nurse is going to provide you with information for [place] [place] [facility], I encourage you to call them to request an appointment, they do have your referral information.  Please let us know there is anything else we can do for you, I hope that you feel better soon.  Kennieth Rad, [name] [facility] [email or web address]    How to Use a Nebulizer, Adult  A nebulizer is a device that turns liquid medicine into a mist or vapor that you can breathe in (inhale). This medicine helps to open the air passages in your lungs. You may need to use a nebulizer if you have an acute breathing illness, such as pneumonia. A nebulizer may also be used to treat chronic conditions, such as asthma or chronic obstructive pulmonarydisease (COPD). There are different kinds of nebulizers. With some nebulizers, you breathe in medicine through a mouthpiece. With others, you get medicine through a mask that fits over your nose and mouth. What are the risks? If you use a nebulizer that does not fit right or is not cleaned properly, it can cause some problems, including:  Infection.  Eye irritation.  Delivery of too much medicine or not enough medicine.  Mouth irritation. Supplies needed:  Air compressor (nebulizer machine).  Nebulizer medicine cup (reservoir)and tubing.  Mouthpiece or face mask.  Soap and water.  Sterile or distilled water.  Clean towel. How to use a nebulizer Preparing a nebulizer Take these steps before using your nebulizer: 1. Read the manufacturer's  instructions for your nebulizer, as machines vary. 2. Check your medicine. Make sure it has not expired and is not damaged in any way. 3. Wash your hands with soap and water. 4. Put all of the parts of your nebulizer on a sturdy, flat surface. 5. Connect the tubing to the nebulizer machine and to the reservoir. 6. Measure the liquid medicine according to instructions from your health care provider. Pour the liquid into the reservoir. 7. Attach the mouthpiece or mask. 8. Test the nebulizer by turning it on to make sure that a spray comes out. Then, turn it off. Using a nebulizer Be sure to stop the machine at any time if you start coughing or if the medicine foams or bubbles. 1. Sit in an upright, relaxed position. 2. If your nebulizer has a mask, put it over your nose and mouth. It should fit somewhat snugly, with no gaps around the nose or cheeks where medicine could escape. If you use a mouthpiece, put it in your mouth. Press your lips firmly around the mouthpiece. 3. Turn on the nebulizer. 4. Some nebulizers have a finger valve. If yours does, cover up the air hole so the air gets to the nebulizer. 5. Once the medicine begins to mist out, take slow, deep breaths. If there is a finger valve, release it at the end of your breath. 6. Continue taking slow, deep breaths until the medicine in the nebulizer is gone and no mist appears.      Cleaning a nebulizer The nebulizer and all of its parts must be  kept very clean. If the nebulizer and its parts are not cleaned properly, bacteria can grow inside of them. If you inhale the bacteria, you can get sick. Follow the manufacturer's instructions for cleaning your nebulizer. For most nebulizers, you should follow these guidelines:  Clean the mouthpiece or mask and the reservoir by: ? Rinsing them after each use. Use sterile or distilled water. ? Washing them 1-2 times a week using soap and warm water.  Do not wash the tubing.  After you rinse or  wash them, place the parts on a clean towel and let them air-dry completely. After they dry, reconnect the pieces and turn the nebulizer on without any medicine in it. Doing this will blow air through the equipment to help dry it out.  Store the nebulizer in a clean and dust-free place.  Check the filter at least one time every week. Replace the filter if it looks dirty. Follow these instructions at home  Use your nebulizer only as told by your health care provider. Do not use the nebulizer more than directed by your health care provider.  Do not use any products that contain nicotine or tobacco, such as cigarettes, e-cigarettes, and chewing tobacco. If you need help quitting, ask your health care provider.  Keep all follow-up visits as told by your health care provider. This is important. Where to find more information  Allergy & Asthma Network: allergyasthmanetwork.org  American Lung Association: www.lung.org Contact a health care provider if:  You have trouble using the nebulizer.  Your nebulizer foams or stops working.  Your nebulizer does not create a mist after you add medicine and turn it on. Get help right away if:  You continue to have trouble breathing.  Your breathing gets worse during a nebulizer treatment. These symptoms may represent a serious problem that is an emergency. Do not wait to see if the symptoms will go away. Get medical help right away. Call your local emergency services (911 in the [place] Do not drive yourself to the hospital. Summary  A nebulizer is a device that turns liquid medicine into a mist (vapor) that you can breathe in (inhale).  Measure the liquid medicine according to instructions from your health care provider. Pour the liquid into the part of the nebulizer that holds the medicine (reservoir).  Once the medicine begins to mist out, take slow, deep breaths.  Rinse or wash the mouthpiece or mask and the reservoir after each use, and allow  them to air-dry completely. This information is not intended to replace advice given to you by your health care provider. Make sure you discuss any questions you have with your health care provider. Document Revised: 04/20/2020 Document Reviewed: 09/18/2019 Elsevier Patient Education  2021 [employer].

## 2021-01-19 NOTE — Progress Notes (Signed)
Established Patient Office Visit  Subjective:  Patient ID: [place], male    DOB: 1955/10/02  Age: 65 y.o. MRN: [id number]  CC:  Chief Complaint  Patient presents with  . Emphysema    HPI [place] states that he started having productive cough with yellow sputum for the past 10 days, cough is keeping him up at night.  Endorses sore throat from coughing.  Has been having SOB and wheezing has been using rescue inhaler every couple of days.  Has also taken flonase without relief.  States that he did run out of his Claritin, was unable to get a refill from his pharmacy.  Denies sick contacts    Past Medical History:  Diagnosis Date  . Cardiogenic shock ([place])   . CHF (congestive heart failure) ([place])   . Coronary artery disease   . Diabetes mellitus without complication ([place])   . Hyperlipidemia   . Medical history non-contributory     Past Surgical History:  Procedure Laterality Date  . CARDIAC CATHETERIZATION    . CORONARY/GRAFT ACUTE MI REVASCULARIZATION N/A 03/31/2019   Procedure: Coronary/Graft Acute MI Revascularization;  Surgeon: Troy Sine, MD;  Location: [facility];  Service: Cardiovascular;  Laterality: N/A;  . ICD IMPLANT N/A 04/15/2019   Procedure: ICD IMPLANT;  Surgeon: Constance Haw, MD;  Location: [facility];  Service: Cardiovascular;  Laterality: N/A;  . LEFT HEART CATH AND CORONARY ANGIOGRAPHY N/A 03/31/2019   Procedure: LEFT HEART CATH AND CORONARY ANGIOGRAPHY;  Surgeon: Troy Sine, MD;  Location: [facility];  Service: Cardiovascular;  Laterality: N/A;  . NO PAST SURGERIES    . RIGHT HEART CATH N/A 03/31/2019   Procedure: RIGHT HEART CATH;  Surgeon: Troy Sine, MD;  Location: [facility];  Service: Cardiovascular;  Laterality: N/A;  . VENTRICULAR ASSIST DEVICE INSERTION N/A 03/31/2019   Procedure: VENTRICULAR ASSIST DEVICE INSERTION;  Surgeon: Troy Sine, MD;  Location: [facility];  Service:  Cardiovascular;  Laterality: N/A;    Family History  Problem Relation Age of Onset  . Hypertension Mother   . Heart failure Mother   . Headache Neg Hx   . Migraines Neg Hx     Social History   Socioeconomic History  . Marital status: Married    Spouse name: Not on file  . Number of children: 1  . Years of education: 8  . Highest education level: 8th grade  Occupational History  . Not on file  Tobacco Use  . Smoking status: Former Smoker    Quit date: 04/08/2019    Years since quitting: 1.7  . Smokeless tobacco: Never Used  Vaping Use  . Vaping Use: Never used  Substance and Sexual Activity  . Alcohol use: No  . Drug use: No  . Sexual activity: Not Currently  Other Topics Concern  . Not on file  Social History Narrative   Lives with wife   Right handed   Caffeine: about 2 cups of coffee every morning, no soda   Social Determinants of Health   Financial Resource Strain: Not on file  Food Insecurity: Not on file  Transportation Needs: Not on file  Physical Activity: Not on file  Stress: Not on file  Social Connections: Not on file  Intimate Partner Violence: Not on file    Outpatient Medications Prior to Visit  Medication Sig Dispense Refill  . Accu-Chek Softclix Lancets lancets Use to measure blood sugar twice  a day 100 each 6  . albuterol (VENTOLIN HFA) 108 (90 Base) MCG/ACT inhaler Inhale 2 puffs into the lungs every 6 (six) hours as needed for wheezing or shortness of breath. 8 g 0  . aspirin 81 MG chewable tablet CHEW 1 TABLET BY MOUTH DAILY. 30 tablet 2  . Blood Glucose Monitoring Suppl (ACCU-CHEK GUIDE ME) w/Device KIT 1 kit by Does not apply route in the morning and at bedtime. Use to measure blood sugar twice a day 1 kit 0  . empagliflozin (JARDIANCE) 10 MG TABS tablet Take 10 mg by mouth daily before breakfast. 30 tablet 11  . eplerenone (INSPRA) 25 MG tablet Take 1 tablet (25 mg total) by mouth daily. 30 tablet 11  . fluticasone (FLONASE) 50 MCG/ACT  nasal spray Place 1 spray into both nostrils daily as needed for allergies or rhinitis. 16 g 2  . furosemide (LASIX) 40 MG tablet Take 1 tablet (40 mg total) by mouth daily as needed. 15 tablet 3  . glucose blood (ACCU-CHEK GUIDE) test strip Use to measure blood sugar twice a day 100 each 6  . Hyprom-Naphaz-Polysorb-Zn Sulf (CLEAR EYES COMPLETE) SOLN Apply 1 drop to eye 3 (three) times daily. 15 mL 1  . metoprolol succinate (TOPROL-XL) 25 MG 24 hr tablet 1/2 tab PO daily 45 tablet 3  . nitroGLYCERIN (NITROSTAT) 0.4 MG SL tablet Place 1 tablet (0.4 mg total) under the tongue every 5 (five) minutes x 3 doses as needed for chest pain. 25 tablet 1  . omeprazole (PRILOSEC) 20 MG capsule Take 1 capsule (20 mg total) by mouth daily. 30 capsule 3  . potassium chloride SA (KLOR-CON) 20 MEQ tablet TAKE 1 TABLET BY MOUTH ONCE DAILY AS NEEDED 15 tablet 3  . rosuvastatin (CRESTOR) 40 MG tablet Take 1 tablet (40 mg total) by mouth daily. 30 tablet 2  . sacubitril-valsartan (ENTRESTO) 49-51 MG Take 1 tablet by mouth 2 (two) times daily. 60 tablet 3  . tiotropium (SPIRIVA HANDIHALER) 18 MCG inhalation capsule Place 1 capsule (18 mcg total) into inhaler and inhale daily. 30 capsule 2  . traZODone (DESYREL) 50 MG tablet TAKE 1/2-1 TABLET BY MOUTH AT BEDTIME 30 tablet 2  . EQ LORATADINE 10 MG tablet Take 1 tablet by mouth once daily 30 tablet 0   No facility-administered medications prior to visit.    Allergies  Allergen Reactions  . Spironolactone     gynecomastia  . Metformin And Related Anxiety and Palpitations    ROS Review of Systems  Constitutional: Negative for chills, fatigue and fever.  HENT: Positive for congestion and sore throat. Negative for ear pain, postnasal drip, rhinorrhea, sinus pressure and trouble swallowing.   Eyes: Negative.   Respiratory: Positive for cough, shortness of breath and wheezing.   Cardiovascular: Negative for chest pain and palpitations.  Gastrointestinal: Negative.    Endocrine: Negative.   Genitourinary: Negative.   Musculoskeletal: Negative.   Skin: Negative.   Allergic/Immunologic: Negative.   Neurological: Negative for headaches.  Hematological: Negative.   Psychiatric/Behavioral: Negative.       Objective:    Physical Exam Vitals and nursing note reviewed.  Constitutional:      General: He is not in acute distress.    Appearance: Normal appearance.  HENT:     Head: Normocephalic and atraumatic.     Right Ear: Tympanic membrane, ear canal and external ear normal.     Left Ear: Tympanic membrane, ear canal and external ear normal.     Nose:  Nose normal.     Mouth/Throat:     Mouth: Mucous membranes are moist.     Pharynx: Oropharynx is clear.  Eyes:     Extraocular Movements: Extraocular movements intact.     Conjunctiva/sclera: Conjunctivae normal.     Pupils: Pupils are equal, round, and reactive to light.  Cardiovascular:     Rate and Rhythm: Normal rate and regular rhythm.     Pulses: Normal pulses.     Heart sounds: Normal heart sounds.  Pulmonary:     Breath sounds: Examination of the right-upper field reveals wheezing. Examination of the left-upper field reveals wheezing. Examination of the right-middle field reveals wheezing. Examination of the left-middle field reveals wheezing. Examination of the right-lower field reveals wheezing. Examination of the left-lower field reveals wheezing. Wheezing present.  Musculoskeletal:        General: Normal range of motion.  Lymphadenopathy:     Cervical: No cervical adenopathy.  Skin:    General: Skin is warm and dry.  Neurological:     General: No focal deficit present.     Mental Status: He is alert and oriented to person, place, and time.  Psychiatric:        Mood and Affect: Mood normal.        Behavior: Behavior normal.        Thought Content: Thought content normal.        Judgment: Judgment normal.     BP 140/86 (BP Location: Left Arm, Patient Position: Sitting, Cuff  Size: Normal)   Pulse 84   Temp 98.2 F (36.8 C) (Oral)   Resp 20   Ht $R'5\' 8"'sj$  (1.727 m)   Wt 199 lb (90.3 kg)   SpO2 96%   BMI 30.26 kg/m  Wt Readings from Last 3 Encounters:  01/19/21 199 lb (90.3 kg)  12/21/20 203 lb 3.2 oz (92.2 kg)  12/01/20 200 lb (90.7 kg)     Health Maintenance Due  Topic Date Due  . FOOT EXAM  Never done  . OPHTHALMOLOGY EXAM  Never done  . COLON CANCER SCREENING ANNUAL FOBT  Never done  . COLONOSCOPY (Pts 45-76yrs Insurance coverage will need to be confirmed)  Never done  . Zoster Vaccines- Shingrix (1 of 2) Never done  . COVID-19 Vaccine (3 - Booster for Pfizer series) 04/26/2020  . HEMOGLOBIN A1C  12/07/2020    There are no preventive care reminders to display for this patient.  Lab Results  Component Value Date   TSH 0.568 10/18/2019   Lab Results  Component Value Date   WBC 6.3 06/08/2020   HGB 14.7 06/08/2020   HCT 44.8 06/08/2020   MCV 89 06/08/2020   PLT 276 06/08/2020   Lab Results  Component Value Date   NA 135 12/21/2020   K 4.6 12/21/2020   CO2 24 12/21/2020   GLUCOSE 166 (H) 12/21/2020   BUN 15 12/21/2020   CREATININE 1.15 12/21/2020   BILITOT 0.3 10/05/2020   ALKPHOS 112 10/05/2020   AST 25 10/05/2020   ALT 28 10/05/2020   PROT 7.2 10/05/2020   ALBUMIN 4.8 10/05/2020   CALCIUM 9.3 12/21/2020   ANIONGAP 10 12/21/2020   EGFR 80 11/16/2020   Lab Results  Component Value Date   CHOL 117 11/16/2020   Lab Results  Component Value Date   HDL 37 (L) 11/16/2020   Lab Results  Component Value Date   LDLCALC 66 11/16/2020   Lab Results  Component Value Date   TRIG 66  11/16/2020   Lab Results  Component Value Date   CHOLHDL 3.2 11/16/2020   Lab Results  Component Value Date   HGBA1C 6.2 (H) 06/08/2020      Assessment & Plan:   Problem List Items Addressed This Visit      Respiratory   Pulmonary emphysema (HCC)   Relevant Medications   albuterol (ACCUNEB) 0.63 MG/3ML nebulizer solution   montelukast  (SINGULAIR) 10 MG tablet   loratadine (CLARITIN) 10 MG tablet   guaiFENesin (MUCINEX) 600 MG 12 hr tablet   predniSONE (DELTASONE) 20 MG tablet   azithromycin (ZITHROMAX) 250 MG tablet   Other Relevant Orders   For home use only DME Nebulizer machine   COPD with acute exacerbation (HCC)   Relevant Medications   albuterol (ACCUNEB) 0.63 MG/3ML nebulizer solution   montelukast (SINGULAIR) 10 MG tablet   loratadine (CLARITIN) 10 MG tablet   guaiFENesin (MUCINEX) 600 MG 12 hr tablet   predniSONE (DELTASONE) 20 MG tablet   azithromycin (ZITHROMAX) 250 MG tablet   Non-seasonal allergic rhinitis    1. COPD with acute exacerbation (HCC) Trial azithromycin, course prednisone, Mucinex.  Patient education given on rest, proper hydration, resume Claritin and continue Flonase.  Red flags given for prompt reevaluation.   - guaiFENesin (MUCINEX) 600 MG 12 hr tablet; Take 1 tablet (600 mg total) by mouth 2 (two) times daily.  Dispense: 60 tablet; Refill: 2 - predniSONE (DELTASONE) 20 MG tablet; Take 2 tablets (40 mg total) by mouth daily with breakfast for 10 days.  Dispense: 20 tablet; Refill: 0 - azithromycin (ZITHROMAX) 250 MG tablet; Take 2 tabs PO day 1, then take 1 tab PO once daily  Dispense: 6 tablet; Refill: 0  2. Pulmonary emphysema, unspecified emphysema type [place]) Patient has pending referral to pulmonology, patient given contact information. - For home use only DME Nebulizer machine - albuterol (ACCUNEB) 0.63 MG/3ML nebulizer solution; Take 3 mLs (0.63 mg total) by nebulization every 6 (six) hours as needed for wheezing.  Dispense: 75 mL; Refill: 12 - montelukast (SINGULAIR) 10 MG tablet; Take 1 tablet (10 mg total) by mouth at bedtime.  Dispense: 30 tablet; Refill: 3 - loratadine (CLARITIN) 10 MG tablet; Take 1 tablet (10 mg total) by mouth daily.  Dispense: 30 tablet; Refill: 11  3. Non-seasonal allergic rhinitis, unspecified trigger    I have reviewed the patient's medical history  (PMH, PSH, Social History, Family History, Medications, and allergies) , and have been updated if relevant. I spent 32 minutes reviewing chart and  face to face time with patient.      Meds ordered this encounter  Medications  . albuterol (ACCUNEB) 0.63 MG/3ML nebulizer solution    Sig: Take 3 mLs (0.63 mg total) by nebulization every 6 (six) hours as needed for wheezing.    Dispense:  75 mL    Refill:  12    Order Specific Question:   Supervising Provider    Answer:   Asencion Noble E [[id number]]  . montelukast (SINGULAIR) 10 MG tablet    Sig: Take 1 tablet (10 mg total) by mouth at bedtime.    Dispense:  30 tablet    Refill:  3    Order Specific Question:   Supervising Provider    Answer:   Joya Gaskins, PATRICK E [[id number]]  . loratadine (CLARITIN) 10 MG tablet    Sig: Take 1 tablet (10 mg total) by mouth daily.    Dispense:  30 tablet    Refill:  11  Order Specific Question:   Supervising Provider    Answer:   Noralyn Pick  . guaiFENesin (MUCINEX) 600 MG 12 hr tablet    Sig: Take 1 tablet (600 mg total) by mouth 2 (two) times daily.    Dispense:  60 tablet    Refill:  2    Order Specific Question:   Supervising Provider    Answer:   Joya Gaskins, PATRICK E [[id number]]  . predniSONE (DELTASONE) 20 MG tablet    Sig: Take 2 tablets (40 mg total) by mouth daily with breakfast for 10 days.    Dispense:  20 tablet    Refill:  0    Order Specific Question:   Supervising Provider    Answer:   Asencion Noble E [[id number]]  . azithromycin (ZITHROMAX) 250 MG tablet    Sig: Take 2 tabs PO day 1, then take 1 tab PO once daily    Dispense:  6 tablet    Refill:  0    Order Specific Question:   Supervising Provider    Answer:   [name] [[id number]]    Follow-up: Return if symptoms worsen or fail to improve.    Loraine Grip [name], [name]

## 2021-01-19 NOTE — Telephone Encounter (Signed)
Pt reports SOB and productive cough, onset 3-4 days ago. States cough productive for large amounts of yellowish phlegm. Reports SOB at rest, with minimal exertion; speech sounds halting during call. States cough keeps him awake at night. Advised ED, pt declined. States he will go to mobile clinic. Reiterated need for ED eval. States "I don;t want to wait there for hours. I'll go to mobile clinic."  Reason for Disposition . [1] MODERATE difficulty breathing (e.g., speaks in phrases, SOB even at rest, pulse 100-120) AND [2] NEW-onset or WORSE than normal  Answer Assessment - Initial Assessment Questions 1. RESPIRATORY STATUS: "Describe your breathing?" (e.g., wheezing, shortness of breath, unable to speak, severe coughing)      AT rest 2. ONSET: "When did this breathing problem begin?"      3-4 days ago 3. PATTERN "Does the difficult breathing come and go, or has it been constant since it started?"      Constant 4. SEVERITY: "How bad is your breathing?" (e.g., mild, moderate, severe)    - MILD: No SOB at rest, mild SOB with walking, speaks normally in sentences, can lie down, no retractions, pulse < 100.    - MODERATE: SOB at rest, SOB with minimal exertion and prefers to sit, cannot lie down flat, speaks in phrases, mild retractions, audible wheezing, pulse 100-120.    - SEVERE: Very SOB at rest, speaks in single words, struggling to breathe, sitting hunched forward, retractions, pulse > 120      Moderate 5. RECURRENT SYMPTOM: "Have you had difficulty breathing before?" If Yes, ask: "When was the last time?" and "What happened that time?"      Yes not this bad 6. CARDIAC HISTORY: "Do you have any history of heart disease?" (e.g., heart attack, angina, bypass surgery, angioplasty)     yes 7. LUNG HISTORY: "Do you have any history of lung disease?"  (e.g., pulmonary embolus, asthma, emphysema)    yes 8. CAUSE: "What do you think is causing the breathing problem?"      Not sure 9. OTHER  SYMPTOMS: "Do you have any other symptoms? (e.g., dizziness, runny nose, cough, chest pain, fever)     Cough, yellowish phlegm. 10. O2 SATURATION MONITOR:  "Do you use an oxygen saturation monitor (pulse oximeter) at home?" If Yes, "What is your reading (oxygen level) today?" "What is your usual oxygen saturation reading?" (e.g., 95%)  Protocols used: BREATHING DIFFICULTY-A-AH

## 2021-01-19 NOTE — Progress Notes (Signed)
Patient reports SOB for the past few days with increased in sputum with yellowish color. Patient has taken medication-inhalers- and not eaten today. Patient reports taking a fluid pill per cardiology and is unable to work and take the medication.

## 2021-01-19 NOTE — Telephone Encounter (Signed)
Medication Refill - Medication: fluticasone (FLONASE) 50 MCG/ACT nasal spray   EQ LORATADINE 10 MG tablet   albuterol (VENTOLIN HFA) 108 (90 Base) MCG/ACT inhaler  predniSONE (DELTASONE) 20 MG tablet  Has the patient contacted their pharmacy? Yes.   (Agent: If no, request that the patient contact the pharmacy for the refill.) (Agent: If yes, when and what did the pharmacy advise?)  Preferred Pharmacy (with phone number or street name): [facility] ([address]), [place]  [address] [place]) [place] [address]  Phone:  [phone] Fax:  [phone]   Agent: Please be advised that RX refills may take up to 3 business days. We ask that you follow-up with your pharmacy.

## 2021-01-19 NOTE — Telephone Encounter (Signed)
Notes to clinic: Patient requesting Prednisone for refill Not on current medication list    Requested Prescriptions  Pending Prescriptions Disp Refills   fluticasone (FLONASE) 50 MCG/ACT nasal spray 16 g 2    Sig: Place 1 spray into both nostrils daily as needed for allergies or rhinitis.      Ear, Nose, and Throat: Nasal Preparations - Corticosteroids Passed - 01/19/2021  8:41 AM      Passed - Valid encounter within last 12 months    Recent Outpatient Visits           1 month ago Essential hypertension   [place], Jarome Matin, RPH-CPP   2 months ago Essential hypertension   [place], Jarome Matin, RPH-CPP   3 months ago Essential hypertension   [place], Stephen L, RPH-CPP   3 months ago Essential hypertension   [place] Ladell Pier, MD   7 months ago Need for influenza vaccination   [place], RPH-CPP       Future Appointments             In 2 weeks Ladell Pier, MD [place]               loratadine (EQ LORATADINE) 10 MG tablet 30 tablet 0    Sig: Take 1 tablet (10 mg total) by mouth daily.      Ear, Nose, and Throat:  Antihistamines Passed - 01/19/2021  8:41 AM      Passed - Valid encounter within last 12 months    Recent Outpatient Visits           1 month ago Essential hypertension   [place], Jarome Matin, RPH-CPP   2 months ago Essential hypertension   [place], Jarome Matin, RPH-CPP   3 months ago Essential hypertension   [place], Stephen L, RPH-CPP   3 months ago Essential hypertension   [place] Ladell Pier, MD   7 months ago  Need for influenza vaccination   [place], Stephen L, RPH-CPP       Future Appointments             In 2 weeks Ladell Pier, MD [place]               albuterol (VENTOLIN HFA) 108 (90 Base) MCG/ACT inhaler 8 g 0    Sig: Inhale 2 puffs into the lungs every 6 (six) hours as needed for wheezing or shortness of breath.      Pulmonology:  Beta Agonists Failed - 01/19/2021  8:41 AM      Failed - One inhaler should last at least one month. If the patient is requesting refills earlier, contact the patient to check for uncontrolled symptoms.      Passed - Valid encounter within last 12 months    Recent Outpatient Visits           1 month ago Essential hypertension   [place], RPH-CPP   2 months ago Essential hypertension   [place]  Tresa Endo, RPH-CPP   3 months ago Essential hypertension   [place], Jarome Matin, RPH-CPP   3 months ago Essential hypertension   [place], MD   7 months ago Need for influenza vaccination   [place], RPH-CPP       Future Appointments             In 2 weeks Ladell Pier, MD [place]

## 2021-01-20 ENCOUNTER — Ambulatory Visit (INDEPENDENT_AMBULATORY_CARE_PROVIDER_SITE_OTHER): Payer: Medicaid Other

## 2021-01-20 DIAGNOSIS — I255 Ischemic cardiomyopathy: Secondary | ICD-10-CM | POA: Diagnosis not present

## 2021-01-20 DIAGNOSIS — J441 Chronic obstructive pulmonary disease with (acute) exacerbation: Secondary | ICD-10-CM | POA: Insufficient documentation

## 2021-01-20 DIAGNOSIS — J3089 Other allergic rhinitis: Secondary | ICD-10-CM | POA: Insufficient documentation

## 2021-01-21 LAB — CUP PACEART REMOTE DEVICE CHECK
Battery Remaining Longevity: 82 mo
Battery Remaining Percentage: 82 %
Battery Voltage: 3.01 V
Brady Statistic AP VP Percent: 1 %
Brady Statistic AP VS Percent: 2.9 %
Brady Statistic AS VP Percent: 1 %
Brady Statistic AS VS Percent: 97 %
Brady Statistic RA Percent Paced: 2.7 %
Brady Statistic RV Percent Paced: 1 %
Date Time Interrogation Session: 20220601040018
HighPow Impedance: 84 Ohm
HighPow Impedance: 84 Ohm
Implantable Lead Implant Date: 20200824
Implantable Lead Implant Date: 20200824
Implantable Lead Location: 753859
Implantable Lead Location: 753860
Implantable Pulse Generator Implant Date: 20200824
Lead Channel Impedance Value: 360 Ohm
Lead Channel Impedance Value: 390 Ohm
Lead Channel Pacing Threshold Amplitude: 0.75 V
Lead Channel Pacing Threshold Amplitude: 0.75 V
Lead Channel Pacing Threshold Pulse Width: 0.5 ms
Lead Channel Pacing Threshold Pulse Width: 0.5 ms
Lead Channel Sensing Intrinsic Amplitude: 11.9 mV
Lead Channel Sensing Intrinsic Amplitude: 3 mV
Lead Channel Setting Pacing Amplitude: 2 V
Lead Channel Setting Pacing Amplitude: 2.5 V
Lead Channel Setting Pacing Pulse Width: 0.5 ms
Lead Channel Setting Sensing Sensitivity: 0.5 mV
Pulse Gen Serial Number: 1300954

## 2021-01-22 MED ORDER — ALBUTEROL SULFATE HFA 108 (90 BASE) MCG/ACT IN AERS: 2.0000 | INHALATION_SPRAY | Freq: Four times a day (QID) | RESPIRATORY_TRACT | 2 refills | Status: DC | PRN

## 2021-01-22 MED ORDER — FLUTICASONE PROPIONATE 50 MCG/ACT NA SUSP: 1.0000 | Freq: Every day | NASAL | 2 refills | Status: DC | PRN

## 2021-02-01 ENCOUNTER — Emergency Department (HOSPITAL_COMMUNITY): Payer: Medicaid Other

## 2021-02-01 ENCOUNTER — Other Ambulatory Visit: Payer: Self-pay

## 2021-02-01 ENCOUNTER — Inpatient Hospital Stay (HOSPITAL_COMMUNITY)
Admission: EM | Admit: 2021-02-01 | Discharge: 2021-02-06 | DRG: 251 | Disposition: A | Discharge status: Home / Self Care | Payer: Medicaid Other | Attending: Cardiology | Admitting: Cardiology

## 2021-02-01 DIAGNOSIS — Z955 Presence of coronary angioplasty implant and graft: Secondary | ICD-10-CM

## 2021-02-01 DIAGNOSIS — R52 Pain, unspecified: Secondary | ICD-10-CM

## 2021-02-01 DIAGNOSIS — I252 Old myocardial infarction: Secondary | ICD-10-CM

## 2021-02-01 DIAGNOSIS — E669 Obesity, unspecified: Secondary | ICD-10-CM | POA: Diagnosis present

## 2021-02-01 DIAGNOSIS — Z888 Allergy status to other drugs, medicaments and biological substances status: Secondary | ICD-10-CM | POA: Diagnosis not present

## 2021-02-01 DIAGNOSIS — J441 Chronic obstructive pulmonary disease with (acute) exacerbation: Secondary | ICD-10-CM | POA: Diagnosis present

## 2021-02-01 DIAGNOSIS — Z7982 Long term (current) use of aspirin: Secondary | ICD-10-CM | POA: Diagnosis not present

## 2021-02-01 DIAGNOSIS — Z7951 Long term (current) use of inhaled steroids: Secondary | ICD-10-CM | POA: Diagnosis not present

## 2021-02-01 DIAGNOSIS — I2510 Atherosclerotic heart disease of native coronary artery without angina pectoris: Secondary | ICD-10-CM | POA: Diagnosis not present

## 2021-02-01 DIAGNOSIS — I213 ST elevation (STEMI) myocardial infarction of unspecified site: Secondary | ICD-10-CM | POA: Diagnosis not present

## 2021-02-01 DIAGNOSIS — R0602 Shortness of breath: Secondary | ICD-10-CM | POA: Diagnosis not present

## 2021-02-01 DIAGNOSIS — N179 Acute kidney failure, unspecified: Secondary | ICD-10-CM | POA: Diagnosis not present

## 2021-02-01 DIAGNOSIS — J439 Emphysema, unspecified: Secondary | ICD-10-CM | POA: Diagnosis not present

## 2021-02-01 DIAGNOSIS — Z95810 Presence of automatic (implantable) cardiac defibrillator: Secondary | ICD-10-CM

## 2021-02-01 DIAGNOSIS — Z87891 Personal history of nicotine dependence: Secondary | ICD-10-CM

## 2021-02-01 DIAGNOSIS — I5022 Chronic systolic (congestive) heart failure: Secondary | ICD-10-CM | POA: Diagnosis present

## 2021-02-01 DIAGNOSIS — Z8249 Family history of ischemic heart disease and other diseases of the circulatory system: Secondary | ICD-10-CM

## 2021-02-01 DIAGNOSIS — Z79899 Other long term (current) drug therapy: Secondary | ICD-10-CM

## 2021-02-01 DIAGNOSIS — I25110 Atherosclerotic heart disease of native coronary artery with unstable angina pectoris: Secondary | ICD-10-CM | POA: Diagnosis not present

## 2021-02-01 DIAGNOSIS — E119 Type 2 diabetes mellitus without complications: Secondary | ICD-10-CM | POA: Diagnosis present

## 2021-02-01 DIAGNOSIS — I491 Atrial premature depolarization: Secondary | ICD-10-CM | POA: Diagnosis not present

## 2021-02-01 DIAGNOSIS — Z6831 Body mass index (BMI) 31.0-31.9, adult: Secondary | ICD-10-CM

## 2021-02-01 DIAGNOSIS — R778 Other specified abnormalities of plasma proteins: Secondary | ICD-10-CM

## 2021-02-01 DIAGNOSIS — I255 Ischemic cardiomyopathy: Secondary | ICD-10-CM | POA: Diagnosis not present

## 2021-02-01 DIAGNOSIS — E785 Hyperlipidemia, unspecified: Secondary | ICD-10-CM | POA: Diagnosis not present

## 2021-02-01 DIAGNOSIS — I25118 Atherosclerotic heart disease of native coronary artery with other forms of angina pectoris: Secondary | ICD-10-CM

## 2021-02-01 DIAGNOSIS — J449 Chronic obstructive pulmonary disease, unspecified: Secondary | ICD-10-CM | POA: Diagnosis not present

## 2021-02-01 DIAGNOSIS — I5042 Chronic combined systolic (congestive) and diastolic (congestive) heart failure: Secondary | ICD-10-CM | POA: Diagnosis not present

## 2021-02-01 DIAGNOSIS — R079 Chest pain, unspecified: Secondary | ICD-10-CM | POA: Diagnosis not present

## 2021-02-01 DIAGNOSIS — I959 Hypotension, unspecified: Secondary | ICD-10-CM | POA: Diagnosis not present

## 2021-02-01 DIAGNOSIS — I10 Essential (primary) hypertension: Secondary | ICD-10-CM | POA: Diagnosis not present

## 2021-02-01 DIAGNOSIS — R072 Precordial pain: Secondary | ICD-10-CM | POA: Diagnosis not present

## 2021-02-01 DIAGNOSIS — Z20822 Contact with and (suspected) exposure to covid-19: Secondary | ICD-10-CM | POA: Diagnosis not present

## 2021-02-01 DIAGNOSIS — R0789 Other chest pain: Secondary | ICD-10-CM | POA: Diagnosis not present

## 2021-02-01 DIAGNOSIS — T82855A Stenosis of coronary artery stent, initial encounter: Secondary | ICD-10-CM | POA: Diagnosis present

## 2021-02-01 DIAGNOSIS — Z7984 Long term (current) use of oral hypoglycemic drugs: Secondary | ICD-10-CM | POA: Diagnosis not present

## 2021-02-01 DIAGNOSIS — I214 Non-ST elevation (NSTEMI) myocardial infarction: Secondary | ICD-10-CM | POA: Diagnosis not present

## 2021-02-01 DIAGNOSIS — I110 Hypertensive heart disease with heart failure: Secondary | ICD-10-CM | POA: Diagnosis not present

## 2021-02-01 DIAGNOSIS — Z9861 Coronary angioplasty status: Secondary | ICD-10-CM

## 2021-02-01 LAB — BASIC METABOLIC PANEL
Anion gap: 11 (ref 5–15)
BUN: 19 mg/dL (ref 8–23)
CO2: 25 mmol/L (ref 22–32)
Calcium: 9.7 mg/dL (ref 8.9–10.3)
Chloride: 99 mmol/L (ref 98–111)
Creatinine, Ser: 1.26 mg/dL — ABNORMAL HIGH (ref 0.61–1.24)
GFR, Estimated: >60 mL/min (ref >60)
Glucose, Bld: 158 mg/dL — ABNORMAL HIGH (ref 70–99)
Potassium: 3.9 mmol/L (ref 3.5–5.1)
Sodium: 135 mmol/L (ref 135–145)

## 2021-02-01 LAB — CBC
HCT: 46.6 % (ref 39.0–52.0)
Hemoglobin: 15.3 g/dL (ref 13.0–17.0)
MCH: 29.7 pg (ref 26.0–34.0)
MCHC: 32.8 g/dL (ref 30.0–36.0)
MCV: 90.5 fL (ref 80.0–100.0)
Platelets: 276 10*3/uL (ref 150–400)
RBC: 5.15 MIL/uL (ref 4.22–5.81)
RDW: 14.2 % (ref 11.5–15.5)
WBC: 18.3 10*3/uL — ABNORMAL HIGH (ref 4.0–10.5)
nRBC: 0 % (ref 0.0–0.2)

## 2021-02-01 LAB — TROPONIN I (HIGH SENSITIVITY)
Troponin I (High Sensitivity): 559 ng/L (ref <18)
Troponin I (High Sensitivity): 3429 ng/L (ref <18)

## 2021-02-01 LAB — RESP PANEL BY RT-PCR (FLU A&B, COVID) ARPGX2
Influenza A by PCR: NEGATIVE
Influenza B by PCR: NEGATIVE
SARS Coronavirus 2 by RT PCR: NEGATIVE

## 2021-02-01 LAB — MAGNESIUM: Magnesium: 1.9 mg/dL (ref 1.7–2.4)

## 2021-02-01 LAB — PROTIME-INR
INR: 1 (ref 0.8–1.2)
Prothrombin Time: 12.8 seconds (ref 11.4–15.2)

## 2021-02-01 MED ORDER — NITROGLYCERIN IN D5W 200-5 MCG/ML-% IV SOLN
0.0000 ug/min | INTRAVENOUS | Status: DC
  Administered 2021-02-01: 20 ug/min via INTRAVENOUS
  Filled 2021-02-01: qty 250

## 2021-02-01 MED ORDER — ONDANSETRON HCL 4 MG/2ML IJ SOLN
4.0000 mg | Freq: Four times a day (QID) | INTRAMUSCULAR | Status: DC | PRN
  Administered 2021-02-02: 4 mg via INTRAVENOUS
  Filled 2021-02-01: qty 2

## 2021-02-01 MED ORDER — TRAZODONE HCL 50 MG PO TABS
25.0000 mg | ORAL_TABLET | Freq: Every day | ORAL | Status: DC
  Administered 2021-02-02 – 2021-02-05 (×5): 25 mg via ORAL
  Filled 2021-02-01 (×5): qty 1

## 2021-02-01 MED ORDER — METOPROLOL SUCCINATE ER 25 MG PO TB24
12.5000 mg | ORAL_TABLET | Freq: Every day | ORAL | Status: DC
  Administered 2021-02-02 – 2021-02-06 (×5): 12.5 mg via ORAL
  Filled 2021-02-01 (×5): qty 1

## 2021-02-01 MED ORDER — ONDANSETRON 4 MG PO TBDP
4.0000 mg | ORAL_TABLET | Freq: Once | ORAL | Status: AC
  Administered 2021-02-01: 4 mg via ORAL
  Filled 2021-02-01: qty 1

## 2021-02-01 MED ORDER — NITROGLYCERIN 0.4 MG SL SUBL: 0.4000 mg | SUBLINGUAL_TABLET | SUBLINGUAL | Status: DC | PRN

## 2021-02-01 MED ORDER — SODIUM CHLORIDE 0.9 % IV SOLN: INTRAVENOUS | Status: DC

## 2021-02-01 MED ORDER — EMPAGLIFLOZIN 10 MG PO TABS: 10.0000 mg | ORAL_TABLET | Freq: Every day | ORAL | Status: DC

## 2021-02-01 MED ORDER — HEPARIN BOLUS VIA INFUSION
4000.0000 [IU] | Freq: Once | INTRAVENOUS | Status: AC
  Administered 2021-02-01: 4000 [IU] via INTRAVENOUS
  Filled 2021-02-01: qty 4000

## 2021-02-01 MED ORDER — ALBUTEROL SULFATE HFA 108 (90 BASE) MCG/ACT IN AERS: 2.0000 | INHALATION_SPRAY | Freq: Four times a day (QID) | RESPIRATORY_TRACT | Status: DC | PRN

## 2021-02-01 MED ORDER — ROSUVASTATIN CALCIUM 20 MG PO TABS
40.0000 mg | ORAL_TABLET | Freq: Every day | ORAL | Status: DC
  Administered 2021-02-02 – 2021-02-06 (×5): 40 mg via ORAL
  Filled 2021-02-01 (×5): qty 2

## 2021-02-01 MED ORDER — HEPARIN (PORCINE) 25000 UT/250ML-% IV SOLN
1400.0000 [IU]/h | INTRAVENOUS | Status: DC
  Administered 2021-02-01: 1100 [IU]/h via INTRAVENOUS
  Filled 2021-02-01: qty 250

## 2021-02-01 MED ORDER — PANTOPRAZOLE SODIUM 40 MG PO TBEC
40.0000 mg | DELAYED_RELEASE_TABLET | Freq: Every day | ORAL | Status: DC
  Administered 2021-02-02 – 2021-02-06 (×5): 40 mg via ORAL
  Filled 2021-02-01 (×5): qty 1

## 2021-02-01 MED ORDER — ALBUTEROL SULFATE (2.5 MG/3ML) 0.083% IN NEBU
2.5000 mg | INHALATION_SOLUTION | Freq: Four times a day (QID) | RESPIRATORY_TRACT | Status: DC | PRN
  Administered 2021-02-03: 2.5 mg via RESPIRATORY_TRACT
  Filled 2021-02-01: qty 3

## 2021-02-01 MED ORDER — POTASSIUM CHLORIDE CRYS ER 20 MEQ PO TBCR
20.0000 meq | EXTENDED_RELEASE_TABLET | Freq: Every day | ORAL | Status: DC
  Administered 2021-02-02 – 2021-02-06 (×5): 20 meq via ORAL
  Filled 2021-02-01 (×5): qty 1

## 2021-02-01 MED ORDER — ASPIRIN 81 MG PO CHEW: 81.0000 mg | CHEWABLE_TABLET | Freq: Every day | ORAL | Status: DC

## 2021-02-01 MED ORDER — MORPHINE SULFATE (PF) 4 MG/ML IV SOLN
4.0000 mg | Freq: Once | INTRAVENOUS | Status: AC
  Administered 2021-02-01: 4 mg via INTRAVENOUS
  Filled 2021-02-01: qty 1

## 2021-02-01 MED ORDER — FLUTICASONE PROPIONATE 50 MCG/ACT NA SUSP
1.0000 | Freq: Every day | NASAL | Status: DC | PRN
  Filled 2021-02-01: qty 16

## 2021-02-01 MED ORDER — ACETAMINOPHEN 325 MG PO TABS: 650.0000 mg | ORAL_TABLET | ORAL | Status: DC | PRN

## 2021-02-01 NOTE — ED Notes (Signed)
Report attempted. Left number.  RN will return call soon.

## 2021-02-01 NOTE — Progress Notes (Signed)
ANTICOAGULATION CONSULT NOTE  Pharmacy Consult for Heparin Indication: chest pain/ACS, rule out MI  Allergies  Allergen Reactions   Spironolactone     gynecomastia   Metformin And Related Anxiety and Palpitations    Patient Measurements: Height: 5\' 8"  (172.7 cm) Weight: 90.3 kg (199 lb 1.2 oz) IBW/kg (Calculated) : 68.4 Heparin Dosing Weight: 86.9  Vital Signs: Temp: 97.9 F (36.6 C) (06/13 1931) Temp Source: Oral (06/13 1931) BP: 150/98 (06/13 1931) Pulse Rate: 82 (06/13 1931)  Labs: Recent Labs    02/01/21 1627  HGB 15.3  HCT 46.6  PLT 276  LABPROT 12.8  INR 1.0  CREATININE 1.26*  TROPONINIHS 559*    Estimated Creatinine Clearance: 64.7 mL/min (A) (by C-G formula based on SCr of 1.26 mg/dL (H)).   Medical History: Past Medical History:  Diagnosis Date   Cardiogenic shock ([place])    CHF (congestive heart failure) ([place])    Coronary artery disease    Diabetes mellitus without complication ([place])    Hyperlipidemia    Medical history non-contributory     Medications:  Scheduled:    morphine injection  4 mg Intravenous Once   ondansetron  4 mg Oral Once    Assessment: Patient in the ED with chest pain. Troponin 559, Hgb within normal and platelets 276 at baseline. Patient not on anticoagulation PTA  Goal of Therapy:  Heparin level 0.3-0.7 units/ml Monitor platelets by anticoagulation protocol: Yes   Plan:  Give 4000 units bolus x 1 Start heparin infusion at 1100 units/hr Check anti-Xa level in 6-8 hours and daily while on heparin Continue to monitor H&H and platelets  Norina Buzzard, PharmD PGY1 Pharmacy Resident 02/01/2021 8:20 PM

## 2021-02-01 NOTE — ED Notes (Signed)
Lab called critical value Troponin 559 reported to EDP patient next to be roomed.

## 2021-02-01 NOTE — ED Provider Notes (Signed)
[facility] EMERGENCY DEPARTMENT Provider Note   CSN: [id number] Arrival date & time: 02/01/21  1554     History Chief Complaint  Patient presents with   Chest Pain    Stephen Jennings is a 65 y.o. male.  The history is provided by the patient and medical records. No language interpreter was used.  Chest Pain Pain location:  Substernal area and L chest Pain quality: crushing, pressure and radiating   Pain radiates to:  L shoulder, R shoulder, L jaw and L arm Pain severity:  Severe Onset quality:  Gradual Duration:  9 hours Timing:  Constant Progression:  Unchanged Chronicity:  New Relieved by:  Nothing Worsened by:  Exertion Ineffective treatments:  None tried Associated symptoms: fatigue, nausea, shortness of breath and vomiting   Associated symptoms: no abdominal pain, no altered mental status, no anxiety, no back pain, no claudication, no cough, no fever, no headache, no lower extremity edema, no near-syncope, no palpitations and no weakness   Risk factors: coronary artery disease, diabetes mellitus, hypertension and male sex   Risk factors: no prior DVT/PE       Past Medical History:  Diagnosis Date   Cardiogenic shock (HCC)    CHF (congestive heart failure) (HCC)    Coronary artery disease    Diabetes mellitus without complication ([place])    Hyperlipidemia    Medical history non-contributory     Patient Active Problem List   Diagnosis Date Noted   COPD with acute exacerbation ([place]) 01/20/2021   Non-seasonal allergic rhinitis 01/20/2021   Pulmonary emphysema ([place]) 12/03/2020   SOB (shortness of breath) 12/02/2020   Upper respiratory tract infection 12/02/2020   History of tobacco abuse 00/34/9179   Chronic systolic heart failure ([place]) 10/05/2020   Coronary artery disease of native artery of native heart with stable angina pectoris ([place]) 10/05/2020   Gastroesophageal reflux disease without esophagitis 10/05/2020   Prediabetes 10/05/2020    Essential hypertension 06/08/2020   Obesity (BMI 30.0-34.9) 06/08/2020   Snoring 11/28/2019   Morning headache 11/28/2019   Chronic daily headache 10/08/2019   History of ST elevation myocardial infarction (STEMI) 05/08/2019   Ischemic cardiomyopathy 05/08/2019   Coronary artery disease involving left main coronary artery 05/08/2019   History of placement of stent in LAD coronary artery 05/08/2019    Past Surgical History:  Procedure Laterality Date   CARDIAC CATHETERIZATION     CORONARY/GRAFT ACUTE MI REVASCULARIZATION N/A 03/31/2019   Procedure: Coronary/Graft Acute MI Revascularization;  Surgeon: Troy Sine, MD;  Location: [place] [facility];  Service: Cardiovascular;  Laterality: N/A;   ICD IMPLANT N/A 04/15/2019   Procedure: ICD IMPLANT;  Surgeon: Constance Haw, MD;  Location: [place] [facility];  Service: Cardiovascular;  Laterality: N/A;   LEFT HEART CATH AND CORONARY ANGIOGRAPHY N/A 03/31/2019   Procedure: LEFT HEART CATH AND CORONARY ANGIOGRAPHY;  Surgeon: Troy Sine, MD;  Location: [place] [facility];  Service: Cardiovascular;  Laterality: N/A;   NO PAST SURGERIES     RIGHT HEART CATH N/A 03/31/2019   Procedure: RIGHT HEART CATH;  Surgeon: Troy Sine, MD;  Location: [place] [facility];  Service: Cardiovascular;  Laterality: N/A;   VENTRICULAR ASSIST DEVICE INSERTION N/A 03/31/2019   Procedure: VENTRICULAR ASSIST DEVICE INSERTION;  Surgeon: Troy Sine, MD;  Location: [place] [facility];  Service: Cardiovascular;  Laterality: N/A;       Family History  Problem Relation Age of Onset   Hypertension Mother  Heart failure Mother    Headache Neg Hx    Migraines Neg Hx     Social History   Tobacco Use   Smoking status: Former    Pack years: 0.00    Types: Cigarettes    Quit date: 04/08/2019    Years since quitting: 1.8   Smokeless tobacco: Never  Vaping Use   Vaping Use: Never used  Substance Use Topics   Alcohol use: No   Drug use: No    Home  Medications Prior to Admission medications   Medication Sig Start Date End Date Taking? Authorizing Provider  Accu-Chek Softclix Lancets lancets Use to measure blood sugar twice a day 11/06/19   Ladell Pier, MD  albuterol (ACCUNEB) 0.63 MG/3ML nebulizer solution Take 3 mLs (0.63 mg total) by nebulization every 6 (six) hours as needed for wheezing. 01/19/21   [name], [name], [name]  albuterol (VENTOLIN HFA) 108 (90 Base) MCG/ACT inhaler Inhale 2 puffs into the lungs every 6 (six) hours as needed for wheezing or shortness of breath. 01/22/21   Ladell Pier, MD  aspirin 81 MG chewable tablet CHEW 1 TABLET BY MOUTH DAILY. 08/08/19   Lyda Jester M, [name]  azithromycin (ZITHROMAX) 250 MG tablet Take 2 tabs PO day 1, then take 1 tab PO once daily 01/19/21   [name], [name], [name]  Blood Glucose Monitoring Suppl (ACCU-CHEK GUIDE ME) w/Device KIT 1 kit by Does not apply route in the morning and at bedtime. Use to measure blood sugar twice a day 11/06/19   Ladell Pier, MD  empagliflozin (JARDIANCE) 10 MG TABS tablet Take 10 mg by mouth daily before breakfast. 09/23/19   Bensimhon, Shaune Pascal, MD  eplerenone (INSPRA) 25 MG tablet Take 1 tablet (25 mg total) by mouth daily. 07/06/20   Ladell Pier, MD  fluticasone (FLONASE) 50 MCG/ACT nasal spray Place 1 spray into both nostrils daily as needed for allergies or rhinitis. 01/22/21   Ladell Pier, MD  furosemide (LASIX) 40 MG tablet Take 1 tablet (40 mg total) by mouth daily as needed. 12/21/20   Bensimhon, Shaune Pascal, MD  glucose blood (ACCU-CHEK GUIDE) test strip Use to measure blood sugar twice a day 11/06/19   Ladell Pier, MD  guaiFENesin (MUCINEX) 600 MG 12 hr tablet Take 1 tablet (600 mg total) by mouth 2 (two) times daily. 01/19/21 01/19/22  [name], [name], [name]  Hyprom-Naphaz-Polysorb-Zn Sulf (CLEAR EYES COMPLETE) SOLN Apply 1 drop to eye 3 (three) times daily. 05/08/19   [name], MD  loratadine (CLARITIN) 10 MG tablet Take 1  tablet (10 mg total) by mouth daily. 01/19/21   [name], [name], [name]  metoprolol succinate (TOPROL-XL) 25 MG 24 hr tablet 1/2 tab PO daily 10/28/19   Ladell Pier, MD  montelukast (SINGULAIR) 10 MG tablet Take 1 tablet (10 mg total) by mouth at bedtime. 01/19/21   [name], [name], [name]  nitroGLYCERIN (NITROSTAT) 0.4 MG SL tablet Place 1 tablet (0.4 mg total) under the tongue every 5 (five) minutes x 3 doses as needed for chest pain. 06/08/20   Ladell Pier, MD  omeprazole (PRILOSEC) 20 MG capsule Take 1 capsule (20 mg total) by mouth daily. 10/05/20   Ladell Pier, MD  potassium chloride SA (KLOR-CON) 20 MEQ tablet TAKE 1 TABLET BY MOUTH ONCE DAILY AS NEEDED 12/24/20   Bensimhon, Shaune Pascal, MD  rosuvastatin (CRESTOR) 40 MG tablet Take 1 tablet (40 mg total) by mouth daily. 11/16/20  Ladell Pier, MD  sacubitril-valsartan (ENTRESTO) 49-51 MG Take 1 tablet by mouth 2 (two) times daily. 12/21/20   Bensimhon, Shaune Pascal, MD  tiotropium (SPIRIVA HANDIHALER) 18 MCG inhalation capsule Place 1 capsule (18 mcg total) into inhaler and inhale daily. 12/07/20 12/07/21  [name], [name], [name]  traZODone (DESYREL) 50 MG tablet TAKE 1/2-1 TABLET BY MOUTH AT BEDTIME 10/11/20   Ladell Pier, MD    Allergies    Spironolactone and Metformin and related  Review of Systems   Review of Systems  Constitutional:  Positive for fatigue. Negative for chills and fever.  HENT:  Negative for congestion.   Eyes:  Negative for visual disturbance.  Respiratory:  Positive for shortness of breath. Negative for cough and chest tightness.   Cardiovascular:  Positive for chest pain. Negative for palpitations, claudication, leg swelling and near-syncope.  Gastrointestinal:  Positive for nausea and vomiting. Negative for abdominal pain, constipation and diarrhea.  Genitourinary:  Negative for flank pain and frequency.  Musculoskeletal:  Negative for back pain and neck pain.  Skin:  Negative for rash and wound.   Neurological:  Negative for weakness, light-headedness and headaches.  Psychiatric/Behavioral:  Negative for agitation and confusion.   All other systems reviewed and are negative.  Physical Exam Updated Vital Signs BP (!) 150/98 (BP Location: Left Arm)   Pulse 82   Temp 97.9 F (36.6 C) (Oral)   Resp 18   SpO2 98%   Physical Exam Vitals and nursing note reviewed.  Constitutional:      General: He is not in acute distress.    Appearance: He is well-developed. He is not ill-appearing, toxic-appearing or diaphoretic.  HENT:     Head: Normocephalic and atraumatic.  Eyes:     Conjunctiva/sclera: Conjunctivae normal.  Cardiovascular:     Rate and Rhythm: Normal rate and regular rhythm.     Heart sounds: Normal heart sounds. No murmur heard. Pulmonary:     Effort: Pulmonary effort is normal. No respiratory distress.     Breath sounds: Normal breath sounds. No decreased breath sounds, wheezing, rhonchi or rales.  Chest:     Chest wall: No tenderness.  Abdominal:     Palpations: Abdomen is soft.     Tenderness: There is no abdominal tenderness.  Musculoskeletal:     Cervical back: Normal range of motion and neck supple.     Right lower leg: No tenderness. No edema.     Left lower leg: No tenderness. No edema.  Skin:    General: Skin is warm and dry.     Capillary Refill: Capillary refill takes less than 2 seconds.     Findings: No erythema.  Neurological:     General: No focal deficit present.     Mental Status: He is alert.  Psychiatric:        Mood and Affect: Mood normal.    ED Results / Procedures / Treatments   Labs (all labs ordered are listed, but only abnormal results are displayed) Labs Reviewed  BASIC METABOLIC PANEL - Abnormal; Notable for the following components:      Result Value   Glucose, Bld 158 (*)    Creatinine, Ser 1.26 (*)    All other components within normal limits  CBC - Abnormal; Notable for the following components:   WBC 18.3 (*)    All  other components within normal limits  TROPONIN I (HIGH SENSITIVITY) - Abnormal; Notable for the following components:   Troponin I (High Sensitivity)  559 (*)    All other components within normal limits  TROPONIN I (HIGH SENSITIVITY) - Abnormal; Notable for the following components:   Troponin I (High Sensitivity) 3,429 (*)    All other components within normal limits  RESP PANEL BY RT-PCR (FLU A&B, COVID) ARPGX2  MAGNESIUM  PROTIME-INR  HEPARIN LEVEL (UNFRACTIONATED)  CBC  CBG MONITORING, ED    EKG EKG Interpretation  Date/Time:  Monday February 01 2021 16:36:33 EDT Ventricular Rate:  84 PR Interval:  150 QRS Duration: 90 QT Interval:  342 QTC Calculation: 404 R Axis:   2 Text Interpretation: Sinus rhythm with Premature atrial complexes with Abberant conduction Septal infarct , age undetermined Abnormal ECG since last tracing no significant change Confirmed by Daleen Bo [phone]) on 02/01/2021 5:52:32 PM  Radiology DG Chest 2 View  Result Date: 02/01/2021 CLINICAL DATA:  Chest pain. EXAM: CHEST - 2 VIEW COMPARISON:  Multiple priors, most recent December 01, 2020. FINDINGS: Emphysematous change and chronic peribronchial thickening. No new consolidation. No visible pleural effusions or pneumothorax. Biapical pleuroparenchymal scarring. The similar cardiomediastinal silhouette. Left dual lead subclavian approach cardiac rhythm maintenance device. IMPRESSION: 1. No evidence of acute cardiopulmonary disease. 2. Emphysema. Electronically Signed   By: Margaretha Sheffield MD   On: 02/01/2021 16:53    Procedures Procedures   CRITICAL CARE Performed by: Gwenyth Allegra Cortny Bambach Total critical care time: 35 minutes Critical care time was exclusive of separately billable procedures and treating other patients. Critical care was necessary to treat or prevent imminent or life-threatening deterioration. Critical care was time spent personally by me on the following activities: development of  treatment plan with patient and/or surrogate as well as nursing, discussions with consultants, evaluation of patient's response to treatment, examination of patient, obtaining history from patient or surrogate, ordering and performing treatments and interventions, ordering and review of laboratory studies, ordering and review of radiographic studies, pulse oximetry and re-evaluation of patient's condition.   Medications Ordered in ED Medications  nitroGLYCERIN (NITROSTAT) SL tablet 0.4 mg (has no administration in time range)  ondansetron (ZOFRAN-ODT) disintegrating tablet 4 mg (has no administration in time range)  morphine 4 MG/ML injection 4 mg (has no administration in time range)    ED Course  I have reviewed the triage vital signs and the nursing notes.  Pertinent labs & imaging results that were available during my care of the patient were reviewed by me and considered in my medical decision making (see chart for details).    MDM Rules/Calculators/A&P                          Stephen Jennings is a 65 y.o. male with a past medical history significant for CAD status post PCI, diabetes, hypertension, hyperlipidemia, CHF with ICD, and previous cardiogenic shock who presents with chest pain.  Patient reports that several days ago he was exerting himself and had some mild chest discomfort but it went away.  He reports that today while exerting himself loading a truck, he started having chest pressure.  He reports that his central chest and radiates into his left arm, left jaw, and a little bit towards his left shoulder blades.  He reports it is "20 out of 10".  He reports associated diaphoresis, nausea/vomiting, and shortness of breath.  He is feeling very fatigued and tired.  EMS gave him aspirin and nitroglycerin and his pain did not significantly improve.  He presented for evaluation and was initially screened in  the waiting room.  EKG initially showed some ST abnormalities but it does  reportedly seem similar to prior.  Cardiology was called who did not feel this was a STEMI.  They did however feel he will need evaluation and cardiology consultation for admission given his history.  Patient is denying recent fevers, chills congestion, cough.  Denies constipation, diarrhea, urinary changes.  Denies leg pain or leg swelling and denies any recent trauma.  Denies other complaints.  On exam, lungs are clear and chest was nontender, I cannot reproduce his discomfort.  He had pulses in extremities.  Legs are nontender nonedematous.  Patient is describing significant pain still in his central chest although he is not tachycardic, tachypneic, and blood pressures in the 140s on my evaluation.  He is not hypoxic.  Patient's labs began to return and his troponin was found to be 559.  Clinically we are somewhat concerned about NSTEMI.  He also has leukocytosis and creatinine is elevated at 1.26.  Troponin will be trended.  COVID swab will be ordered.  8:21 PM Spoke with cardiology who recommends heparinization and agrees with admission.  They will see to admit given his cardiology history.  We will give the patient pain medicine, heparin, COVID swab, and he will be admitted.  Final Clinical Impression(s) / ED Diagnoses Final diagnoses:  Precordial pain  Elevated troponin    Clinical Impression: 1. Precordial pain   2. Pain   3. Elevated troponin     Disposition: Admit  This note was prepared with assistance of Dragon voice recognition software. Occasional wrong-word or sound-a-like substitutions may have occurred due to the inherent limitations of voice recognition software.     Tamula Morrical, Gwenyth Allegra, MD 02/01/21 2113

## 2021-02-01 NOTE — H&P (Addendum)
Cardiology Admission History and Physical:   Patient ID: Stephen Jennings MRN: [id number]; DOB: 04-15-1956   Admission date: 02/01/2021  Primary Care Provider: Ladell Pier, MD [facility] Cardiologist: [name], MD [facility] Electrophysiologist:  None   Chief Complaint: chest pain   Patient Profile:   Stephen Jennings is a 65 y.o. 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   History of Present Illness:   Stephen Jennings reported that his first symptoms started on 06/12 when he was weed eating and around noon he started to experience bilateral shoulder pain along with mild back pain but denied any arm or chest pain.  After stopping with his work his symptoms resolved after around 30 minutes.  On 06/13 he was loading firewood and a truck and experienced acute onset 10/10 severity chest pressure with associated left arm discomfort which started around 40 minutes after working.  He had associated nausea and diaphoresis along with 2 episodes of emesis and headache all during the same time.  He has not had any recent similar symptoms.  He works for himself in [employer] and is very active.  His arm pain felt like "if you had a cramp in your arm that had gone away but the pain was still persistent."  Upon arrival to the ED several hours later he still had 5/10 chest pressure with 10/10 left upper arm discomfort.  He had taken to SL NG at home with no improvement in his symptoms.  He had filled his prescriptions within the past 2 months.  His nausea was also persistent upon presentation to the ED.  He works most days of the week outside and is never limited by exertional activity.  Following the episodes of diaphoresis he also had chills but denied any cough, congestion, sputum production, or sick contacts.   Although he has not had any  exertional symptoms over the past few months, he has had intermittent episodes of dyspnea both at rest and exertion for which she was treated with 2 courses of antibiotics and steroids.  He was on DAPT with ticagrelor which was discontinued although it was not felt to be the cause of his dyspnea.  He has extensive prior tobacco use of 2 PPD for over 40 years.  He stopped smoking following his fairly complex last hospitalization in 03/2019 when he suffered a cardiac arrest in the setting of an anterior STEMI.  He did not recall similar symptoms leading up to his prior hospitalization.  His wife said that he had gone to bed early that night and when she went to bed he was reporting pain although he does not recall this.  She called 911 and he was able to walk to the ambulance before experiencing a cardiac arrest once EMS had arrived.  Following revascularization of his LAD he subsequently had a recurrent cardiac arrest several days later for which she underwent secondary prevention ICD.  He does not remember what symptoms he was having leading up to hospitalization and arrest.  During my evaluation he was having ongoing 3-11/2008 severity chest pressure along with moderate to severe left upper extremity posterior arm discomfort.  His blood pressure was 140/97, HR (86), SpO2 (92%) on RA.  He has no known history of premature CAD or SCD although he did not know his father and his mother died at 38 of a heart  attack.   Past Medical History:  Diagnosis Date   Cardiogenic shock ([place])    CHF (congestive heart failure) ([place])    Coronary artery disease    Diabetes mellitus without complication ([place])    Hyperlipidemia    Medical history non-contributory    Past Surgical History:  Procedure Laterality Date   CARDIAC CATHETERIZATION     CORONARY/GRAFT ACUTE MI REVASCULARIZATION N/A 03/31/2019   Procedure: Coronary/Graft Acute MI Revascularization;  Surgeon: Troy Sine, MD;  Location: [place] [facility];   Service: Cardiovascular;  Laterality: N/A;   ICD IMPLANT N/A 04/15/2019   Procedure: ICD IMPLANT;  Surgeon: Constance Haw, MD;  Location: [place] [facility];  Service: Cardiovascular;  Laterality: N/A;   LEFT HEART CATH AND CORONARY ANGIOGRAPHY N/A 03/31/2019   Procedure: LEFT HEART CATH AND CORONARY ANGIOGRAPHY;  Surgeon: Troy Sine, MD;  Location: [place] [facility];  Service: Cardiovascular;  Laterality: N/A;   NO PAST SURGERIES     RIGHT HEART CATH N/A 03/31/2019   Procedure: RIGHT HEART CATH;  Surgeon: Troy Sine, MD;  Location: [place] [facility];  Service: Cardiovascular;  Laterality: N/A;   VENTRICULAR ASSIST DEVICE INSERTION N/A 03/31/2019   Procedure: VENTRICULAR ASSIST DEVICE INSERTION;  Surgeon: Troy Sine, MD;  Location: [place] [facility];  Service: Cardiovascular;  Laterality: N/A;    Medications Prior to Admission: Prior to Admission medications   Medication Sig Start Date End Date Taking? Authorizing Provider  Accu-Chek Softclix Lancets lancets Use to measure blood sugar twice a day 11/06/19   Ladell Pier, MD  albuterol (ACCUNEB) 0.63 MG/3ML nebulizer solution Take 3 mLs (0.63 mg total) by nebulization every 6 (six) hours as needed for wheezing. 01/19/21   [name], [name], [name]  albuterol (VENTOLIN HFA) 108 (90 Base) MCG/ACT inhaler Inhale 2 puffs into the lungs every 6 (six) hours as needed for wheezing or shortness of breath. 01/22/21   Ladell Pier, MD  aspirin 81 MG chewable tablet CHEW 1 TABLET BY MOUTH DAILY. 08/08/19   Lyda Jester M, [name]  azithromycin (ZITHROMAX) 250 MG tablet Take 2 tabs PO day 1, then take 1 tab PO once daily 01/19/21   [name], [name], [name]  Blood Glucose Monitoring Suppl (ACCU-CHEK GUIDE ME) w/Device KIT 1 kit by Does not apply route in the morning and at bedtime. Use to measure blood sugar twice a day 11/06/19   Ladell Pier, MD  empagliflozin (JARDIANCE) 10 MG TABS tablet Take 10 mg by mouth daily before breakfast.  09/23/19   Bensimhon, Shaune Pascal, MD  eplerenone (INSPRA) 25 MG tablet Take 1 tablet (25 mg total) by mouth daily. 07/06/20   Ladell Pier, MD  fluticasone (FLONASE) 50 MCG/ACT nasal spray Place 1 spray into both nostrils daily as needed for allergies or rhinitis. 01/22/21   Ladell Pier, MD  furosemide (LASIX) 40 MG tablet Take 1 tablet (40 mg total) by mouth daily as needed. 12/21/20   Bensimhon, Shaune Pascal, MD  glucose blood (ACCU-CHEK GUIDE) test strip Use to measure blood sugar twice a day 11/06/19   Ladell Pier, MD  guaiFENesin (MUCINEX) 600 MG 12 hr tablet Take 1 tablet (600 mg total) by mouth 2 (two) times daily. 01/19/21 01/19/22  [name], [name], [name]  Hyprom-Naphaz-Polysorb-Zn Sulf (CLEAR EYES COMPLETE) SOLN Apply 1 drop to eye 3 (three) times daily. 05/08/19   [name], MD  loratadine (CLARITIN) 10 MG tablet Take 1 tablet (10  mg total) by mouth daily. 01/19/21   [name], [name], [name]  metoprolol succinate (TOPROL-XL) 25 MG 24 hr tablet 1/2 tab PO daily 10/28/19   Ladell Pier, MD  montelukast (SINGULAIR) 10 MG tablet Take 1 tablet (10 mg total) by mouth at bedtime. 01/19/21   [name], [name], [name]  nitroGLYCERIN (NITROSTAT) 0.4 MG SL tablet Place 1 tablet (0.4 mg total) under the tongue every 5 (five) minutes x 3 doses as needed for chest pain. 06/08/20   Ladell Pier, MD  omeprazole (PRILOSEC) 20 MG capsule Take 1 capsule (20 mg total) by mouth daily. 10/05/20   Ladell Pier, MD  potassium chloride SA (KLOR-CON) 20 MEQ tablet TAKE 1 TABLET BY MOUTH ONCE DAILY AS NEEDED 12/24/20   Bensimhon, Shaune Pascal, MD  rosuvastatin (CRESTOR) 40 MG tablet Take 1 tablet (40 mg total) by mouth daily. 11/16/20   Ladell Pier, MD  sacubitril-valsartan (ENTRESTO) 49-51 MG Take 1 tablet by mouth 2 (two) times daily. 12/21/20   Bensimhon, Shaune Pascal, MD  tiotropium (SPIRIVA HANDIHALER) 18 MCG inhalation capsule Place 1 capsule (18 mcg total) into inhaler and inhale daily. 12/07/20 12/07/21   [name], [name], [name]  traZODone (DESYREL) 50 MG tablet TAKE 1/2-1 TABLET BY MOUTH AT BEDTIME 10/11/20   Ladell Pier, MD    Allergies:    Allergies  Allergen Reactions   Spironolactone     gynecomastia   Metformin And Related Anxiety and Palpitations   Other Anxiety and Palpitations   Social History:   Social History   Socioeconomic History   Marital status: Married    Spouse name: Not on file   Number of children: 1   Years of education: 8   Highest education level: 8th grade  Occupational History   Not on file  Tobacco Use   Smoking status: Former    Pack years: 0.00    Types: Cigarettes    Quit date: 04/08/2019    Years since quitting: 1.8   Smokeless tobacco: Never  Vaping Use   Vaping Use: Never used  Substance and Sexual Activity   Alcohol use: No   Drug use: No   Sexual activity: Not Currently  Other Topics Concern   Not on file  Social History Narrative   Lives with wife   Right handed   Caffeine: about 2 cups of coffee every morning, no soda   Social Determinants of Health   Financial Resource Strain: Not on file  Food Insecurity: Not on file  Transportation Needs: Not on file  Physical Activity: Not on file  Stress: Not on file  Social Connections: Not on file  Intimate Partner Violence: Not on file    Family History:  The patient's family history includes Heart failure in his mother; Hypertension in his mother. There is no history of Headache or Migraines.    ROS:   Review of Systems: [y] = yes, [ ]  = no      General: Weight gain [ ] ; Weight loss [ ] ; Anorexia [ ] ; Fatigue [ ] ; Fever [ ] ; Chills [y]; Weakness [ ]    Cardiac: Chest pain/pressure [y]; Resting SOB [ ] ; Exertional SOB [ ] ; Orthopnea [ ] ; Pedal Edema [ ] ; Palpitations [ ] ; Syncope [ ] ; Presyncope [ ] ; Paroxysmal nocturnal dyspnea [ ]    Pulmonary: Cough [ ] ; Wheezing [ ] ; Hemoptysis [ ] ; Sputum [ ] ; Snoring [ ]    GI: Vomiting [y]; Dysphagia [ ] ; Melena [ ] ; Hematochezia [ ] ;  Heartburn [ ] ; Abdominal pain [ ] ; Constipation [ ] ; Diarrhea [ ] ; BRBPR [ ]    GU: Hematuria [ ] ; Dysuria [ ] ; Nocturia [ ]  Vascular: Pain in legs with walking [ ] ; Pain in feet with lying flat [ ] ; Non-healing sores [ ] ; Stroke [ ] ; TIA [ ] ; Slurred speech [ ] ;   Neuro: Headaches [ ] ; Vertigo [ ] ; Seizures [ ] ; Paresthesias [ ] ;Blurred vision [ ] ; Diplopia [ ] ; Vision changes [ ]    Ortho/Skin: Arthritis [ ] ; Joint pain [ ] ; Muscle pain [ ] ; Joint swelling [ ] ; Back Pain [ ] ; Rash [ ]    Psych: Depression [ ] ; Anxiety [ ]    Heme: Bleeding problems [ ] ; Clotting disorders [ ] ; Anemia [ ]    Endocrine: Diabetes [y]; Thyroid dysfunction [ ]    Physical Exam/Data:   Vitals:   02/01/21 2351 02/02/21 0016 02/02/21 0141 02/02/21 0456  BP:  122/85 123/87 101/68  Pulse:  83 87 81  Resp: 18   18  Temp: 99.2 F (37.3 C)   98.6 F (37 C)  TempSrc: Oral   Oral  SpO2:  96% 92% 92%  Weight: 91 kg     Height: 5' 8"  (1.727 m)       Intake/Output Summary (Last 24 hours) at 02/02/2021 0521 Last data filed at 02/01/2021 2217 Gross per 24 hour  Intake 2.64 ml  Output --  Net 2.64 ml   Last 3 Weights 02/01/2021 02/01/2021 01/19/2021  Weight (lbs) 200 lb 11.2 oz 199 lb 1.2 oz 199 lb  Weight (kg) 91.037 kg 90.3 kg 90.266 kg     Body mass index is 30.52 kg/m.  General:  Well nourished, well developed, in no acute distress HEENT: normal Lymph: no adenopathy Neck: no JVD Endocrine:  No thryomegaly Vascular: No carotid bruits; FA pulses 2+ bilaterally without bruits  Cardiac:  normal S1, S2; RRR; no murmur  Lungs:  clear to auscultation bilaterally, no wheezing, rhonchi or rales  Abd: soft, nontender, no hepatomegaly  Ext: no LE edema Musculoskeletal:  No deformities, BUE and BLE strength normal and equal Skin: warm and dry  Neuro:  CNs 2-12 intact, no focal abnormalities noted Psych:  Normal affect   EKG:  The ECG that was done 02/01/21 (16:36:33) and was personally reviewed and demonstrates NSR  (HR 84), ST elevation in V2 (0.5-1 mm) and V3 (1 mm), septal infarct (documented on 12/21/20 ECG)  Relevant CV Studies:  TTE Result date: 12/14/20  1. Left ventricular ejection fraction, by estimation, is 55%. The left  ventricle has normal function. The left ventricle has no regional wall  motion abnormalities. Left ventricular diastolic function could not be  evaluated. The average left ventricular  global longitudinal strain is -16.9 %. The global longitudinal strain is  normal.   2. Right ventricular systolic function is normal. The right ventricular  size is moderately enlarged. There is moderately elevated pulmonary artery  systolic pressure. The estimated right ventricular systolic pressure is  38.1 mmHg.   3. The mitral valve is normal in structure. Mild mitral valve  regurgitation. No evidence of mitral stenosis.   4. The aortic valve is normal in structure. Aortic valve regurgitation is  not visualized. No aortic stenosis is present.   5. The inferior vena cava is normal in size with <50% respiratory  variability, suggesting right atrial pressure of 8 mmHg.   Coronary angiography Result date: 03/31/19 Mid LM to Prox LAD lesion is 100% stenosed. Mid LAD  lesion is 100% stenosed. A drug-eluting stent was successfully placed using a STENT RESOLUTE ONYX 2.75X26. Post intervention, there is a 0% residual stenosis. Post intervention, there is a 0% residual stenosis. Prox RCA lesion is 70% stenosed. Post intervention, there is a 0% residual stenosis.  Laboratory Data:  High Sensitivity Troponin:   Recent Labs  Lab 02/01/21 1627 02/01/21 1827  TROPONINIHS 559* 3,429*      Chemistry Recent Labs  Lab 02/01/21 1627  NA 135  K 3.9  CL 99  CO2 25  GLUCOSE 158*  BUN 19  CREATININE 1.26*  CALCIUM 9.7  GFRNONAA >60  ANIONGAP 11    No results for input(s): PROT, ALBUMIN, AST, ALT, ALKPHOS, BILITOT in the last 168 hours. Hematology Recent Labs  Lab 02/01/21 1627   WBC 18.3*  RBC 5.15  HGB 15.3  HCT 46.6  MCV 90.5  MCH 29.7  MCHC 32.8  RDW 14.2  PLT 276   BNPNo results for input(s): BNP, PROBNP in the last 168 hours.  DDimer No results for input(s): DDIMER in the last 168 hours.  Radiology/Studies:  DG Chest 2 View  Result Date: 02/01/2021 CLINICAL DATA:  Chest pain. EXAM: CHEST - 2 VIEW COMPARISON:  Multiple priors, most recent December 01, 2020. FINDINGS: Emphysematous change and chronic peribronchial thickening. No new consolidation. No visible pleural effusions or pneumothorax. Biapical pleuroparenchymal scarring. The similar cardiomediastinal silhouette. Left dual lead subclavian approach cardiac rhythm maintenance device. IMPRESSION: 1. No evidence of acute cardiopulmonary disease. 2. Emphysema. Electronically Signed   By: Margaretha Sheffield MD   On: 02/01/2021 16:53    TIMI Risk Score for Unstable Angina or Non-ST Elevation MI:   The patient's TIMI risk score is 6, which indicates a 41% risk of all cause mortality, new or recurrent myocardial infarction or need for urgent revascularization in the next 14 days.  Assessment and Plan:   NSTEMI  Stephen Jennings presented with chest pressure and left arm discomfort concerning for typical cardiac chest pain.  He has known history of prior CAD with revascularized proximal LAD as a culprit lesion for his anterior STEMI in 2020 and unrevascularized 70% RCA stenosis.  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  His hsT trend is c/w NSTEMI with increase from [phone]). ECG with elevation in V2-V3 new from prior that do not meet STEMI criteria and anterior q waves similar to prior.  Although his symptoms did not initially alleviate with SL NG, they did improve with IV nitroglycerin. - NPO MN, plan for repeat coronary angio 06/14 - s/p ASA 324 mg PO by EMS, continue home ASA 81 mg PO daily  - not on P2Y12i, was on ticag but d/c last month as he was having ongoing SOB, dyspnea/SOB somewhat improved off  ticag  - continue toprol XL 12.5 mg PO daily (home dose), likely can uptitrate  - hold home entresto 49-51 mg with mild AKI and pending cath  - hold home spiro 25 mg PO daily for same as above - continue crestor 40 mg PO daily, LDL (62) on 09/23/19, improved from (188) during prior hospitalization, repeat lipids pending  - continue heparin gtt - TTE ordered   COPD 40-year smoking history of 2 packs/day fortunately quit in 2020.  He has had shortness of breath both at rest and exertion over the past few months that have improved with antibiotics and steroids intermittently.  Part of his issue is secondary to not taking his inhalers as prescribed.  He  seems to take both his maintenance Spiriva and as needed albuterol only on an as-needed basis.  Provided some guidance on this however will need additional counseling. - continue spiriva and prn albuterol - additional medication counseling on daily adherence will be important   DM2 - continue jardiance 10 mg PO daily  - A1c (6.2) on 06/08/20, repeat pending   HTN - hold entresto and spiro as above with mild AKI and NPO for cath  - BP not at goal, on moderate dose entresto, does not want to further increase as significant diuresis after starting  - has lasix 40 mg PO daily prn, takes 1-2x per week as needed for SOB - resume spiro/entresto post cath - consider adding amlodipine   HLD - continue crestor 40 mg PO daily  - lipids at goal, repeat pending   Severity of Illness: The appropriate patient status for this patient is INPATIENT. Inpatient status is judged to be reasonable and necessary in order to provide the required intensity of service to ensure the patient's safety. The patient's presenting symptoms, physical exam findings, and initial radiographic and laboratory data in the context of their chronic comorbidities is felt to place them at high risk for further clinical deterioration. Furthermore, it is not anticipated that the patient  will be medically stable for discharge from the hospital within 2 midnights of admission. The following factors support the patient status of inpatient.   " The patient's presenting symptoms include chest pressure.  " The worrisome physical exam findings include none.  " The initial radiographic and laboratory data are worrisome because of significantly elevated troponin.  " The chronic co-morbidities include CAD, DM2, HTN, HLD, prior cardiac arrest, prior tobacco use.   * I certify that at the point of admission it is my clinical judgment that the patient will require inpatient hospital care spanning beyond 2 midnights from the point of admission due to high intensity of service, high risk for further deterioration and high frequency of surveillance required.*   For questions or updates, please contact [place] Please consult [email or web address] for contact info under   Signed, Dion Body, MD  02/02/2021 5:21 AM

## 2021-02-01 NOTE — ED Provider Notes (Addendum)
I was notified of this patient's elevated troponin.  I looked at his EKG, and I was concerned about possible ST segment elevation in V3 through V6 as well as possible ST depression in the inferior leads.  In my opinion, this EKG is changed from his prior EKG.  I spoke with STEMI attending, Dr. Gwenlyn Found, and he does not think this meets STEMI criteria.  Patient will require cardiology consult and admission.   Arnaldo Natal, MD 02/01/21 Ladoris Gene    Arnaldo Natal, MD 02/01/21 Jeri Lager    Arnaldo Natal, MD 02/01/21 [phone]

## 2021-02-01 NOTE — ED Triage Notes (Signed)
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 Took two nitro and 324 asa at home, and EMS gave one more asa without change.

## 2021-02-01 NOTE — ED Provider Notes (Signed)
Emergency Medicine Provider Triage Evaluation Note  Glenroy Crossen , a 66 y.o. male  was evaluated in triage.  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  He states he took 2 nitros at home was administered more aspirin with EMS at this time, some shortness of breath.  History of MIs in the past.  Review of Systems  Positive: Chest pain, shortness of breath, numbness and tingling in his left arm. Negative: Syncope, unilateral weakness  Physical Exam  There were no vitals taken for this visit. Gen:   Awake, acute distress secondary to chest pain. Resp:  Normal effort  MSK:   Moves extremities without difficulty  Other:  Regular rate and rhythm, murmurs, gallops, or rubs.  Medical Decision Making  Medically screening exam initiated at 4:20 PM.  Appropriate orders placed.  Jovanny Stephanie was informed that the remainder of the evaluation will be completed by another provider, this initial triage assessment does not replace that evaluation, and the importance of remaining in the ED until their evaluation is complete.   This chart was dictated using voice recognition software, Dragon. Despite the best efforts of this provider to proofread and correct errors, errors may still occur which can change documentation meaning.    Aura Dials 02/01/21 1630    Daleen Bo, MD 02/01/21 1752

## 2021-02-02 ENCOUNTER — Encounter (HOSPITAL_COMMUNITY)
Admission: EM | Disposition: A | Discharge status: Home / Self Care | Payer: Self-pay | Source: Home / Self Care | Attending: Cardiology

## 2021-02-02 ENCOUNTER — Inpatient Hospital Stay (HOSPITAL_COMMUNITY): Payer: Medicaid Other

## 2021-02-02 ENCOUNTER — Encounter (HOSPITAL_COMMUNITY): Payer: Self-pay | Admitting: Student in an Organized Health Care Education/Training Program

## 2021-02-02 ENCOUNTER — Ambulatory Visit: Payer: Medicaid Other | Admitting: Internal Medicine

## 2021-02-02 DIAGNOSIS — I25110 Atherosclerotic heart disease of native coronary artery with unstable angina pectoris: Secondary | ICD-10-CM

## 2021-02-02 DIAGNOSIS — I214 Non-ST elevation (NSTEMI) myocardial infarction: Principal | ICD-10-CM

## 2021-02-02 DIAGNOSIS — I2510 Atherosclerotic heart disease of native coronary artery without angina pectoris: Secondary | ICD-10-CM | POA: Diagnosis not present

## 2021-02-02 DIAGNOSIS — R778 Other specified abnormalities of plasma proteins: Secondary | ICD-10-CM | POA: Diagnosis not present

## 2021-02-02 DIAGNOSIS — J439 Emphysema, unspecified: Secondary | ICD-10-CM | POA: Diagnosis not present

## 2021-02-02 DIAGNOSIS — I110 Hypertensive heart disease with heart failure: Secondary | ICD-10-CM | POA: Diagnosis not present

## 2021-02-02 DIAGNOSIS — E119 Type 2 diabetes mellitus without complications: Secondary | ICD-10-CM | POA: Diagnosis not present

## 2021-02-02 DIAGNOSIS — N179 Acute kidney failure, unspecified: Secondary | ICD-10-CM | POA: Diagnosis not present

## 2021-02-02 DIAGNOSIS — Z6831 Body mass index (BMI) 31.0-31.9, adult: Secondary | ICD-10-CM | POA: Diagnosis not present

## 2021-02-02 DIAGNOSIS — I959 Hypotension, unspecified: Secondary | ICD-10-CM | POA: Diagnosis not present

## 2021-02-02 DIAGNOSIS — I5042 Chronic combined systolic (congestive) and diastolic (congestive) heart failure: Secondary | ICD-10-CM | POA: Diagnosis not present

## 2021-02-02 DIAGNOSIS — Z20822 Contact with and (suspected) exposure to covid-19: Secondary | ICD-10-CM | POA: Diagnosis not present

## 2021-02-02 DIAGNOSIS — I10 Essential (primary) hypertension: Secondary | ICD-10-CM | POA: Diagnosis not present

## 2021-02-02 DIAGNOSIS — E669 Obesity, unspecified: Secondary | ICD-10-CM | POA: Diagnosis not present

## 2021-02-02 DIAGNOSIS — T82855A Stenosis of coronary artery stent, initial encounter: Secondary | ICD-10-CM | POA: Diagnosis not present

## 2021-02-02 HISTORY — PX: LEFT HEART CATH AND CORONARY ANGIOGRAPHY: CATH118249

## 2021-02-02 LAB — BASIC METABOLIC PANEL
Anion gap: 10 (ref 5–15)
BUN: 19 mg/dL (ref 8–23)
CO2: 24 mmol/L (ref 22–32)
Calcium: 9.2 mg/dL (ref 8.9–10.3)
Chloride: 100 mmol/L (ref 98–111)
Creatinine, Ser: 1.04 mg/dL (ref 0.61–1.24)
GFR, Estimated: >60 mL/min (ref >60)
Glucose, Bld: 127 mg/dL — ABNORMAL HIGH (ref 70–99)
Potassium: 4.4 mmol/L (ref 3.5–5.1)
Sodium: 134 mmol/L — ABNORMAL LOW (ref 135–145)

## 2021-02-02 LAB — CBC
HCT: 44.2 % (ref 39.0–52.0)
Hemoglobin: 14.6 g/dL (ref 13.0–17.0)
MCH: 29.9 pg (ref 26.0–34.0)
MCHC: 33 g/dL (ref 30.0–36.0)
MCV: 90.4 fL (ref 80.0–100.0)
Platelets: 224 10*3/uL (ref 150–400)
RBC: 4.89 MIL/uL (ref 4.22–5.81)
RDW: 14.6 % (ref 11.5–15.5)
WBC: 15.3 10*3/uL — ABNORMAL HIGH (ref 4.0–10.5)
nRBC: 0 % (ref 0.0–0.2)

## 2021-02-02 LAB — ECHOCARDIOGRAM COMPLETE
AR max vel: 2.41 cm2
AV Area VTI: 2.48 cm2
AV Area mean vel: 2.42 cm2
AV Mean grad: 3 mmHg
AV Peak grad: 5.9 mmHg
Ao pk vel: 1.21 m/s
Area-P 1/2: 4.26 cm2
Height: 68 in
S' Lateral: 3.8 cm
Weight: 3211.2 oz

## 2021-02-02 LAB — HIV ANTIBODY (ROUTINE TESTING W REFLEX): HIV Screen 4th Generation wRfx: NONREACTIVE

## 2021-02-02 LAB — HEPARIN LEVEL (UNFRACTIONATED): Heparin Unfractionated: 0.14 IU/mL — ABNORMAL LOW (ref 0.30–0.70)

## 2021-02-02 LAB — LIPID PANEL
Cholesterol: 221 mg/dL — ABNORMAL HIGH (ref 0–200)
HDL: 53 mg/dL (ref >40)
LDL Cholesterol: 150 mg/dL — ABNORMAL HIGH (ref 0–99)
Total CHOL/HDL Ratio: 4.2 RATIO
Triglycerides: 89 mg/dL (ref <150)
VLDL: 18 mg/dL (ref 0–40)

## 2021-02-02 LAB — GLUCOSE, CAPILLARY: Glucose-Capillary: 171 mg/dL — ABNORMAL HIGH (ref 70–99)

## 2021-02-02 LAB — TSH: TSH: 0.784 u[IU]/mL (ref 0.350–4.500)

## 2021-02-02 SURGERY — LEFT HEART CATH AND CORONARY ANGIOGRAPHY
Anesthesia: LOCAL

## 2021-02-02 MED ORDER — TIROFIBAN HCL IN NACL 5-0.9 MG/100ML-% IV SOLN
INTRAVENOUS | Status: AC | PRN
  Administered 2021-02-02: 0.15 ug/kg/min via INTRAVENOUS

## 2021-02-02 MED ORDER — LABETALOL HCL 5 MG/ML IV SOLN: 10.0000 mg | INTRAVENOUS | Status: AC | PRN

## 2021-02-02 MED ORDER — TICAGRELOR 90 MG PO TABS
90.0000 mg | ORAL_TABLET | Freq: Two times a day (BID) | ORAL | Status: DC
  Administered 2021-02-02 – 2021-02-04 (×4): 90 mg via ORAL
  Filled 2021-02-02 (×5): qty 1

## 2021-02-02 MED ORDER — SODIUM CHLORIDE 0.9 % IV SOLN: 250.0000 mL | INTRAVENOUS | Status: DC | PRN

## 2021-02-02 MED ORDER — HYDRALAZINE HCL 20 MG/ML IJ SOLN: 10.0000 mg | INTRAMUSCULAR | Status: AC | PRN

## 2021-02-02 MED ORDER — SODIUM CHLORIDE 0.9% FLUSH
3.0000 mL | Freq: Two times a day (BID) | INTRAVENOUS | Status: DC
  Administered 2021-02-06: 3 mL via INTRAVENOUS

## 2021-02-02 MED ORDER — TIROFIBAN (AGGRASTAT) BOLUS VIA INFUSION
INTRAVENOUS | Status: DC | PRN
  Administered 2021-02-02: 2275 ug via INTRAVENOUS

## 2021-02-02 MED ORDER — HEPARIN BOLUS VIA INFUSION
3000.0000 [IU] | Freq: Once | INTRAVENOUS | Status: AC
  Administered 2021-02-02: 3000 [IU] via INTRAVENOUS
  Filled 2021-02-02: qty 3000

## 2021-02-02 MED ORDER — TIROFIBAN HCL IN NACL 5-0.9 MG/100ML-% IV SOLN
0.1500 ug/kg/min | INTRAVENOUS | Status: DC
  Administered 2021-02-02 – 2021-02-04 (×8): 0.15 ug/kg/min via INTRAVENOUS
  Filled 2021-02-02 (×8): qty 100

## 2021-02-02 MED ORDER — HEPARIN (PORCINE) IN NACL 1000-0.9 UT/500ML-% IV SOLN
INTRAVENOUS | Status: DC | PRN
  Administered 2021-02-02 (×2): 500 mL

## 2021-02-02 MED ORDER — PERFLUTREN LIPID MICROSPHERE
1.0000 mL | INTRAVENOUS | Status: AC | PRN
  Administered 2021-02-02: 5 mL via INTRAVENOUS
  Filled 2021-02-02: qty 10

## 2021-02-02 MED ORDER — SODIUM CHLORIDE 0.9 % IV SOLN: INTRAVENOUS | Status: AC

## 2021-02-02 MED ORDER — HEPARIN SODIUM (PORCINE) 1000 UNIT/ML IJ SOLN
INTRAMUSCULAR | Status: AC
  Filled 2021-02-02: qty 1

## 2021-02-02 MED ORDER — HEPARIN SODIUM (PORCINE) 1000 UNIT/ML IJ SOLN
INTRAMUSCULAR | Status: DC | PRN
  Administered 2021-02-02: 4500 [IU] via INTRAVENOUS

## 2021-02-02 MED ORDER — VERAPAMIL HCL 2.5 MG/ML IV SOLN
INTRAVENOUS | Status: AC
  Filled 2021-02-02: qty 2

## 2021-02-02 MED ORDER — LIDOCAINE HCL (PF) 1 % IJ SOLN
INTRAMUSCULAR | Status: DC | PRN
  Administered 2021-02-02: 2 mL

## 2021-02-02 MED ORDER — EMPAGLIFLOZIN 10 MG PO TABS
10.0000 mg | ORAL_TABLET | Freq: Every day | ORAL | Status: DC
  Administered 2021-02-02 – 2021-02-05 (×5): 10 mg via ORAL
  Filled 2021-02-02 (×6): qty 1

## 2021-02-02 MED ORDER — SODIUM CHLORIDE 0.9% FLUSH: 3.0000 mL | INTRAVENOUS | Status: DC | PRN

## 2021-02-02 MED ORDER — TICAGRELOR 90 MG PO TABS
ORAL_TABLET | ORAL | Status: DC | PRN
  Administered 2021-02-02: 180 mg via ORAL

## 2021-02-02 MED ORDER — IOHEXOL 350 MG/ML SOLN
INTRAVENOUS | Status: DC | PRN
  Administered 2021-02-02: 60 mL via INTRA_ARTERIAL

## 2021-02-02 MED ORDER — ONDANSETRON HCL 4 MG/2ML IJ SOLN: 4.0000 mg | Freq: Four times a day (QID) | INTRAMUSCULAR | Status: DC | PRN

## 2021-02-02 MED ORDER — MIDAZOLAM HCL 2 MG/2ML IJ SOLN
INTRAMUSCULAR | Status: DC | PRN
  Administered 2021-02-02: 1 mg via INTRAVENOUS

## 2021-02-02 MED ORDER — NITROGLYCERIN IN D5W 200-5 MCG/ML-% IV SOLN
0.0000 ug/min | INTRAVENOUS | Status: DC
  Administered 2021-02-02: 5 ug/min via INTRAVENOUS

## 2021-02-02 MED ORDER — TICAGRELOR 90 MG PO TABS
ORAL_TABLET | ORAL | Status: AC
  Filled 2021-02-02: qty 2

## 2021-02-02 MED ORDER — FENTANYL CITRATE (PF) 100 MCG/2ML IJ SOLN
INTRAMUSCULAR | Status: AC
  Filled 2021-02-02: qty 2

## 2021-02-02 MED ORDER — FENTANYL CITRATE (PF) 100 MCG/2ML IJ SOLN
INTRAMUSCULAR | Status: DC | PRN
  Administered 2021-02-02: 25 ug via INTRAVENOUS

## 2021-02-02 MED ORDER — ACETAMINOPHEN 325 MG PO TABS
650.0000 mg | ORAL_TABLET | ORAL | Status: DC | PRN
  Administered 2021-02-03 – 2021-02-05 (×2): 650 mg via ORAL
  Filled 2021-02-02 (×2): qty 2

## 2021-02-02 MED ORDER — MIDAZOLAM HCL 2 MG/2ML IJ SOLN
INTRAMUSCULAR | Status: AC
  Filled 2021-02-02: qty 2

## 2021-02-02 MED ORDER — DIAZEPAM 5 MG PO TABS
5.0000 mg | ORAL_TABLET | Freq: Four times a day (QID) | ORAL | Status: DC | PRN
  Administered 2021-02-02 – 2021-02-05 (×3): 5 mg via ORAL
  Filled 2021-02-02 (×3): qty 1

## 2021-02-02 MED ORDER — ASPIRIN 81 MG PO CHEW
81.0000 mg | CHEWABLE_TABLET | Freq: Every day | ORAL | Status: DC
  Administered 2021-02-03 – 2021-02-04 (×2): 81 mg via ORAL
  Filled 2021-02-02 (×3): qty 1

## 2021-02-02 MED ORDER — VERAPAMIL HCL 2.5 MG/ML IV SOLN
INTRAVENOUS | Status: DC | PRN
  Administered 2021-02-02: 10 mL via INTRA_ARTERIAL

## 2021-02-02 MED ORDER — SODIUM CHLORIDE 0.9 % WEIGHT BASED INFUSION
3.0000 mL/kg/h | INTRAVENOUS | Status: DC
  Administered 2021-02-02: 3 mL/kg/h via INTRAVENOUS

## 2021-02-02 MED ORDER — SODIUM CHLORIDE 0.9 % WEIGHT BASED INFUSION: 1.0000 mL/kg/h | INTRAVENOUS | Status: DC

## 2021-02-02 MED ORDER — SODIUM CHLORIDE 0.9% FLUSH
3.0000 mL | Freq: Two times a day (BID) | INTRAVENOUS | Status: DC
  Administered 2021-02-02: 3 mL via INTRAVENOUS

## 2021-02-02 MED ORDER — LIDOCAINE HCL (PF) 1 % IJ SOLN
INTRAMUSCULAR | Status: AC
  Filled 2021-02-02: qty 30

## 2021-02-02 MED ORDER — ASPIRIN 81 MG PO CHEW
81.0000 mg | CHEWABLE_TABLET | ORAL | Status: AC
  Administered 2021-02-02: 81 mg via ORAL
  Filled 2021-02-02: qty 1

## 2021-02-02 MED ORDER — SODIUM CHLORIDE 0.9 % IV SOLN
INTRAVENOUS | Status: AC | PRN
  Administered 2021-02-02: 10 mL/h via INTRAVENOUS

## 2021-02-02 MED ORDER — TICAGRELOR 90 MG PO TABS: 90.0000 mg | ORAL_TABLET | Freq: Two times a day (BID) | ORAL | Status: DC

## 2021-02-02 MED ORDER — TIROFIBAN HCL IN NACL 5-0.9 MG/100ML-% IV SOLN
INTRAVENOUS | Status: AC
  Filled 2021-02-02: qty 100

## 2021-02-02 MED ORDER — HEPARIN (PORCINE) IN NACL 1000-0.9 UT/500ML-% IV SOLN
INTRAVENOUS | Status: AC
  Filled 2021-02-02: qty 500

## 2021-02-02 SURGICAL SUPPLY — 11 items

## 2021-02-02 NOTE — H&P (View-Only) (Signed)
Progress Note  Patient Name: Stephen Jennings Date of Encounter: 02/02/2021  [facility] Cardiologist: Dr Haroldine Laws    Subjective   No CP this AM  Inpatient Medications    Scheduled Meds:  aspirin  81 mg Oral Daily   empagliflozin  10 mg Oral QHS   metoprolol succinate  12.5 mg Oral Daily   pantoprazole  40 mg Oral Daily   potassium chloride SA  20 mEq Oral Daily   rosuvastatin  40 mg Oral Daily   sodium chloride flush  3 mL Intravenous Q12H   traZODone  25 mg Oral QHS   Continuous Infusions:  sodium chloride     sodium chloride     sodium chloride 3 mL/kg/hr (02/02/21 0830)   Followed by   sodium chloride     heparin Stopped (02/02/21 0831)   nitroGLYCERIN 10 mcg/min (02/02/21 0630)   PRN Meds: sodium chloride, acetaminophen, albuterol, fluticasone, nitroGLYCERIN, ondansetron (ZOFRAN) IV, sodium chloride flush   Vital Signs    Vitals:   02/01/21 2351 02/02/21 0016 02/02/21 0141 02/02/21 0456  BP:  122/85 123/87 101/68  Pulse:  83 87 81  Resp: 18   18  Temp: 99.2 F (37.3 C)   98.6 F (37 C)  TempSrc: Oral   Oral  SpO2:  96% 92% 92%  Weight: 91 kg     Height: 5\' 8"  (1.727 m)       Intake/Output Summary (Last 24 hours) at 02/02/2021 0835 Last data filed at 02/02/2021 0831 Gross per 24 hour  Intake 428.02 ml  Output 975 ml  Net -546.98 ml   Last 3 Weights 02/01/2021 02/01/2021 01/19/2021  Weight (lbs) 200 lb 11.2 oz 199 lb 1.2 oz 199 lb  Weight (kg) 91.037 kg 90.3 kg 90.266 kg      Telemetry    Normal sinus rhythm- Personally Reviewed  ECG    Sinus rhythm 81 with subtle biphasic lateral T waves- Personally Reviewed  Physical Exam   GEN: No acute distress.   Neck: No JVD Cardiac: RRR, no murmurs, rubs, or gallops.  Respiratory: Clear to auscultation bilaterally. GI: Soft, nontender, non-distended  MS: No edema; No deformity. Neuro:  Nonfocal  Psych: Normal affect   Labs    High Sensitivity Troponin:   Recent Labs  Lab 02/01/21 1627  02/01/21 1827  TROPONINIHS 559* 3,429*      Chemistry Recent Labs  Lab 02/01/21 1627 02/02/21 0552  NA 135 134*  K 3.9 4.4  CL 99 100  CO2 25 24  GLUCOSE 158* 127*  BUN 19 19  CREATININE 1.26* 1.04  CALCIUM 9.7 9.2  GFRNONAA >60 >60  ANIONGAP 11 10     Hematology Recent Labs  Lab 02/01/21 1627 02/02/21 0552  WBC 18.3* 15.3*  RBC 5.15 4.89  HGB 15.3 14.6  HCT 46.6 44.2  MCV 90.5 90.4  MCH 29.7 29.9  MCHC 32.8 33.0  RDW 14.2 14.6  PLT 276 224    BNPNo results for input(s): BNP, PROBNP in the last 168 hours.   DDimer No results for input(s): DDIMER in the last 168 hours.   Radiology    DG Chest 2 View  Result Date: 02/01/2021 CLINICAL DATA:  Chest pain. EXAM: CHEST - 2 VIEW COMPARISON:  Multiple priors, most recent December 01, 2020. FINDINGS: Emphysematous change and chronic peribronchial thickening. No new consolidation. No visible pleural effusions or pneumothorax. Biapical pleuroparenchymal scarring. The similar cardiomediastinal silhouette. Left dual lead subclavian approach cardiac rhythm maintenance device. IMPRESSION: 1.  No evidence of acute cardiopulmonary disease. 2. Emphysema. Electronically Signed   By: Margaretha Sheffield MD   On: 02/01/2021 16:53    Cardiac Studies   None  Patient Profile     Stephen Jennings is a 65 y.o. 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   Assessment & Plan    1: Non-STEMI-troponin is increased to 3400.  He was admitted yesterday with left arm pain and chest pain which ultimately resolved with IV heparin and nitroglycerin.  His EKG did not meet STEMI criteria and this morning shows sinus rhythm in the 80s with biphasic lateral T waves.  I suspect that he either has had progression of his RCA stenosis which which was 70% 2 years ago or in-stent restenosis within his proximal LAD stent.  He is  scheduled for diagnostic coronary angiography this morning with Dr. Claiborne Billings.   I have reviewed the risks, indications, and alternatives to cardiac catheterization, possible angioplasty, and stenting with the patient. Risks include but are not limited to bleeding, infection, vascular injury, stroke, myocardial infection, arrhythmia, kidney injury, radiation-related injury in the case of prolonged fluoroscopy use, emergency cardiac surgery, and death. The patient understands the risks of serious complication is 1-2 in 4665 with diagnostic cardiac cath and 1-2% or less with angioplasty/stenting.    2: Tobacco abuse-stopped 2 years ago at the time of his index event.  He does have COPD on inhaled bronchodilators.  3: Type 2 diabetes-continue Jardiance  4: Essential hypertension-blood pressure well controlled on current medications.  He is somewhat hypotensive.  Resume spironolactone and Entresto post-cath.  5: Hyperlipidemia-on Crestor 40 mg a day.  For questions or updates, please contact [place] Please consult [email or web address] for contact info under        Signed, Quay Burow, MD  02/02/2021, 8:35 AM

## 2021-02-02 NOTE — Progress Notes (Signed)
ANTICOAGULATION CONSULT NOTE - Follow Up Consult  Pharmacy Consult for heparin Indication:  NSTEMI  Labs: Recent Labs    02/01/21 1627 02/01/21 1827 02/02/21 0552  HGB 15.3  --  14.6  HCT 46.6  --  44.2  PLT 276  --  224  LABPROT 12.8  --   --   INR 1.0  --   --   HEPARINUNFRC  --   --  0.14*  CREATININE 1.26*  --  1.04  TROPONINIHS 559* 3,429*  --     Assessment: 64yo male subtherapeutic on heparin with initial dosing for NSTEMI; no gtt issues or signs of bleeding per RN.  Goal of Therapy:  Heparin level 0.3-0.7 units/ml   Plan:  Will rebolus with heparin 3000 units and increase heparin gtt by 3 units/kg/hr to 1400 units/hr and check level in 6 hours.    Wynona Neat, PharmD, BCPS  02/02/2021,6:51 AM

## 2021-02-02 NOTE — Progress Notes (Signed)
ANTICOAGULATION CONSULT NOTE - Follow Up Consult  Pharmacy Consult for aggrastat Indication:  NSTEMI  Labs: Recent Labs    02/01/21 1627 02/01/21 1827 02/02/21 0552  HGB 15.3  --  14.6  HCT 46.6  --  44.2  PLT 276  --  224  LABPROT 12.8  --   --   INR 1.0  --   --   HEPARINUNFRC  --   --  0.14*  CREATININE 1.26*  --  1.04  TROPONINIHS 559* 3,429*  --      Assessment: 65yo male admitted as NSTEMI. Pt recently completed ticagrelor course outpatient. LHC today revealed thrombus burden, pharmacy asked to dose tirofiban x48h with plans for relook cath.   Goal of Therapy:  Heparin level 0.3-0.7 units/ml   Plan:  Tirofiban 0.15 mcg/kg/min x48h Watch CBC  Arrie Senate, PharmD, [place], [facility] Clinical Pharmacist [phone] Please check AMION for all [place] numbers 02/02/2021

## 2021-02-02 NOTE — Progress Notes (Signed)
Progress Note  Patient Name: Stephen Jennings Date of Encounter: 02/02/2021  [facility] Cardiologist: Dr Haroldine Laws    Subjective   No CP this AM  Inpatient Medications    Scheduled Meds:  aspirin  81 mg Oral Daily   empagliflozin  10 mg Oral QHS   metoprolol succinate  12.5 mg Oral Daily   pantoprazole  40 mg Oral Daily   potassium chloride SA  20 mEq Oral Daily   rosuvastatin  40 mg Oral Daily   sodium chloride flush  3 mL Intravenous Q12H   traZODone  25 mg Oral QHS   Continuous Infusions:  sodium chloride     sodium chloride     sodium chloride 3 mL/kg/hr (02/02/21 0830)   Followed by   sodium chloride     heparin Stopped (02/02/21 0831)   nitroGLYCERIN 10 mcg/min (02/02/21 0630)   PRN Meds: sodium chloride, acetaminophen, albuterol, fluticasone, nitroGLYCERIN, ondansetron (ZOFRAN) IV, sodium chloride flush   Vital Signs    Vitals:   02/01/21 2351 02/02/21 0016 02/02/21 0141 02/02/21 0456  BP:  122/85 123/87 101/68  Pulse:  83 87 81  Resp: 18   18  Temp: 99.2 F (37.3 C)   98.6 F (37 C)  TempSrc: Oral   Oral  SpO2:  96% 92% 92%  Weight: 91 kg     Height: 5\' 8"  (1.727 m)       Intake/Output Summary (Last 24 hours) at 02/02/2021 0835 Last data filed at 02/02/2021 0831 Gross per 24 hour  Intake 428.02 ml  Output 975 ml  Net -546.98 ml   Last 3 Weights 02/01/2021 02/01/2021 01/19/2021  Weight (lbs) 200 lb 11.2 oz 199 lb 1.2 oz 199 lb  Weight (kg) 91.037 kg 90.3 kg 90.266 kg      Telemetry    Normal sinus rhythm- Personally Reviewed  ECG    Sinus rhythm 81 with subtle biphasic lateral T waves- Personally Reviewed  Physical Exam   GEN: No acute distress.   Neck: No JVD Cardiac: RRR, no murmurs, rubs, or gallops.  Respiratory: Clear to auscultation bilaterally. GI: Soft, nontender, non-distended  MS: No edema; No deformity. Neuro:  Nonfocal  Psych: Normal affect   Labs    High Sensitivity Troponin:   Recent Labs  Lab 02/01/21 1627  02/01/21 1827  TROPONINIHS 559* 3,429*      Chemistry Recent Labs  Lab 02/01/21 1627 02/02/21 0552  NA 135 134*  K 3.9 4.4  CL 99 100  CO2 25 24  GLUCOSE 158* 127*  BUN 19 19  CREATININE 1.26* 1.04  CALCIUM 9.7 9.2  GFRNONAA >60 >60  ANIONGAP 11 10     Hematology Recent Labs  Lab 02/01/21 1627 02/02/21 0552  WBC 18.3* 15.3*  RBC 5.15 4.89  HGB 15.3 14.6  HCT 46.6 44.2  MCV 90.5 90.4  MCH 29.7 29.9  MCHC 32.8 33.0  RDW 14.2 14.6  PLT 276 224    BNPNo results for input(s): BNP, PROBNP in the last 168 hours.   DDimer No results for input(s): DDIMER in the last 168 hours.   Radiology    DG Chest 2 View  Result Date: 02/01/2021 CLINICAL DATA:  Chest pain. EXAM: CHEST - 2 VIEW COMPARISON:  Multiple priors, most recent December 01, 2020. FINDINGS: Emphysematous change and chronic peribronchial thickening. No new consolidation. No visible pleural effusions or pneumothorax. Biapical pleuroparenchymal scarring. The similar cardiomediastinal silhouette. Left dual lead subclavian approach cardiac rhythm maintenance device. IMPRESSION: 1.  No evidence of acute cardiopulmonary disease. 2. Emphysema. Electronically Signed   By: Margaretha Sheffield MD   On: 02/01/2021 16:53    Cardiac Studies   None  Patient Profile     Presley Summerlin is a 65 y.o. 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   Assessment & Plan    1: Non-STEMI-troponin is increased to 3400.  He was admitted yesterday with left arm pain and chest pain which ultimately resolved with IV heparin and nitroglycerin.  His EKG did not meet STEMI criteria and this morning shows sinus rhythm in the 80s with biphasic lateral T waves.  I suspect that he either has had progression of his RCA stenosis which which was 70% 2 years ago or in-stent restenosis within his proximal LAD stent.  He is  scheduled for diagnostic coronary angiography this morning with Dr. Claiborne Billings.   I have reviewed the risks, indications, and alternatives to cardiac catheterization, possible angioplasty, and stenting with the patient. Risks include but are not limited to bleeding, infection, vascular injury, stroke, myocardial infection, arrhythmia, kidney injury, radiation-related injury in the case of prolonged fluoroscopy use, emergency cardiac surgery, and death. The patient understands the risks of serious complication is 1-2 in 9604 with diagnostic cardiac cath and 1-2% or less with angioplasty/stenting.    2: Tobacco abuse-stopped 2 years ago at the time of his index event.  He does have COPD on inhaled bronchodilators.  3: Type 2 diabetes-continue Jardiance  4: Essential hypertension-blood pressure well controlled on current medications.  He is somewhat hypotensive.  Resume spironolactone and Entresto post-cath.  5: Hyperlipidemia-on Crestor 40 mg a day.  For questions or updates, please contact [place] Please consult [email or web address] for contact info under        Signed, Quay Burow, MD  02/02/2021, 8:35 AM

## 2021-02-02 NOTE — Interval H&P Note (Signed)
Cath Lab Visit (complete for each Cath Lab visit)  Clinical Evaluation Leading to the Procedure:   ACS: Yes.    Non-ACS:    Anginal Classification: CCS III  Anti-ischemic medical therapy: Minimal Therapy (1 class of medications)  Non-Invasive Test Results: No non-invasive testing performed  Prior CABG: No previous CABG      History and Physical Interval Note:  02/02/2021 9:05 AM  Stephen Jennings  has presented today for surgery, with the diagnosis of NSTEMI.  The various methods of treatment have been discussed with the patient and family. After consideration of risks, benefits and other options for treatment, the patient has consented to  Procedure(s): LEFT HEART CATH AND CORONARY ANGIOGRAPHY (N/A) as a surgical intervention.  The patient's history has been reviewed, patient examined, no change in status, stable for surgery.  I have reviewed the patient's chart and labs.  Questions were answered to the patient's satisfaction.     Shelva Majestic

## 2021-02-03 DIAGNOSIS — I959 Hypotension, unspecified: Secondary | ICD-10-CM | POA: Diagnosis not present

## 2021-02-03 DIAGNOSIS — E669 Obesity, unspecified: Secondary | ICD-10-CM | POA: Diagnosis not present

## 2021-02-03 DIAGNOSIS — T82855A Stenosis of coronary artery stent, initial encounter: Secondary | ICD-10-CM | POA: Diagnosis not present

## 2021-02-03 DIAGNOSIS — I5042 Chronic combined systolic (congestive) and diastolic (congestive) heart failure: Secondary | ICD-10-CM | POA: Diagnosis not present

## 2021-02-03 DIAGNOSIS — I10 Essential (primary) hypertension: Secondary | ICD-10-CM

## 2021-02-03 DIAGNOSIS — Z6831 Body mass index (BMI) 31.0-31.9, adult: Secondary | ICD-10-CM | POA: Diagnosis not present

## 2021-02-03 DIAGNOSIS — E119 Type 2 diabetes mellitus without complications: Secondary | ICD-10-CM | POA: Diagnosis not present

## 2021-02-03 DIAGNOSIS — I214 Non-ST elevation (NSTEMI) myocardial infarction: Secondary | ICD-10-CM | POA: Diagnosis not present

## 2021-02-03 DIAGNOSIS — J439 Emphysema, unspecified: Secondary | ICD-10-CM | POA: Diagnosis not present

## 2021-02-03 DIAGNOSIS — N179 Acute kidney failure, unspecified: Secondary | ICD-10-CM | POA: Diagnosis not present

## 2021-02-03 DIAGNOSIS — Z20822 Contact with and (suspected) exposure to covid-19: Secondary | ICD-10-CM | POA: Diagnosis not present

## 2021-02-03 DIAGNOSIS — I2510 Atherosclerotic heart disease of native coronary artery without angina pectoris: Secondary | ICD-10-CM

## 2021-02-03 DIAGNOSIS — I110 Hypertensive heart disease with heart failure: Secondary | ICD-10-CM | POA: Diagnosis not present

## 2021-02-03 DIAGNOSIS — R778 Other specified abnormalities of plasma proteins: Secondary | ICD-10-CM | POA: Diagnosis not present

## 2021-02-03 LAB — CBC
HCT: 44.7 % (ref 39.0–52.0)
Hemoglobin: 14.5 g/dL (ref 13.0–17.0)
MCH: 29.5 pg (ref 26.0–34.0)
MCHC: 32.4 g/dL (ref 30.0–36.0)
MCV: 91 fL (ref 80.0–100.0)
Platelets: 235 10*3/uL (ref 150–400)
RBC: 4.91 MIL/uL (ref 4.22–5.81)
RDW: 14.7 % (ref 11.5–15.5)
WBC: 10.1 10*3/uL (ref 4.0–10.5)
nRBC: 0 % (ref 0.0–0.2)

## 2021-02-03 LAB — BASIC METABOLIC PANEL
Anion gap: 8 (ref 5–15)
BUN: 20 mg/dL (ref 8–23)
CO2: 24 mmol/L (ref 22–32)
Calcium: 9.1 mg/dL (ref 8.9–10.3)
Chloride: 103 mmol/L (ref 98–111)
Creatinine, Ser: 1.15 mg/dL (ref 0.61–1.24)
GFR, Estimated: >60 mL/min (ref >60)
Glucose, Bld: 144 mg/dL — ABNORMAL HIGH (ref 70–99)
Potassium: 4.2 mmol/L (ref 3.5–5.1)
Sodium: 135 mmol/L (ref 135–145)

## 2021-02-03 LAB — HEMOGLOBIN A1C
Hgb A1c MFr Bld: 6.4 % — ABNORMAL HIGH (ref 4.8–5.6)
Mean Plasma Glucose: 137 mg/dL

## 2021-02-03 MED ORDER — SODIUM CHLORIDE 0.9 % IV SOLN: 250.0000 mL | INTRAVENOUS | Status: DC | PRN

## 2021-02-03 MED ORDER — SODIUM CHLORIDE 0.9% FLUSH
3.0000 mL | Freq: Two times a day (BID) | INTRAVENOUS | Status: DC
  Administered 2021-02-03: 3 mL via INTRAVENOUS

## 2021-02-03 MED ORDER — SODIUM CHLORIDE 0.9% FLUSH: 3.0000 mL | INTRAVENOUS | Status: DC | PRN

## 2021-02-03 MED ORDER — FUROSEMIDE 40 MG PO TABS
40.0000 mg | ORAL_TABLET | Freq: Every day | ORAL | Status: DC
  Administered 2021-02-04 – 2021-02-06 (×3): 40 mg via ORAL
  Filled 2021-02-03 (×3): qty 1

## 2021-02-03 MED ORDER — FUROSEMIDE 10 MG/ML IJ SOLN
40.0000 mg | Freq: Once | INTRAMUSCULAR | Status: AC
  Administered 2021-02-03: 40 mg via INTRAVENOUS
  Filled 2021-02-03: qty 4

## 2021-02-03 MED ORDER — LIVING BETTER WITH HEART FAILURE BOOK: Freq: Once | Status: AC

## 2021-02-03 MED ORDER — SODIUM CHLORIDE 0.9 % IV SOLN: INTRAVENOUS | Status: DC

## 2021-02-03 MED ORDER — POLYVINYL ALCOHOL 1.4 % OP SOLN
2.0000 [drp] | OPHTHALMIC | Status: DC | PRN
  Administered 2021-02-03 – 2021-02-05 (×2): 2 [drp] via OPHTHALMIC
  Filled 2021-02-03: qty 15

## 2021-02-03 NOTE — Progress Notes (Signed)
ANTICOAGULATION CONSULT NOTE - Follow Up Consult  Pharmacy Consult for aggrastat Indication:  NSTEMI  Labs: Recent Labs    02/01/21 1627 02/01/21 1827 02/02/21 0552 02/03/21 0118  HGB 15.3  --  14.6 14.5  HCT 46.6  --  44.2 44.7  PLT 276  --  224 235  LABPROT 12.8  --   --   --   INR 1.0  --   --   --   HEPARINUNFRC  --   --  0.14*  --   CREATININE 1.26*  --  1.04  --   TROPONINIHS 559* 3,429*  --   --      Assessment: 65yo male admitted as NSTEMI. Pt recently completed ticagrelor course outpatient. LHC today revealed thrombus burden, pharmacy asked to dose tirofiban x48h with plans for relook cath.   CBC/platelet count stable   Plan:  Tirofiban 0.15 mcg/kg/min x48h Watch CBC  [place] Crane, Roylene Reason, [facility] Clinical Pharmacist  02/03/2021 8:49 AM   [facility] pharmacy phone numbers are listed on [email or web address]

## 2021-02-03 NOTE — H&P (View-Only) (Signed)
Progress Note  Patient Name: Stephen Jennings Date of Encounter: 02/03/2021  [facility] Cardiologist: Dr. Ardyth Man. [place]  Subjective   No acute overnight events. He notes some shortness of breath since yesterday which he thinks he is due to the [place]. He is laying almost completely flat at this time and look comfortably. No chest pain. He also reports night sweats which has been going on for 2 years. He has already talked about this with his PCP. No fevers, body chills, weight loss, or abnormal bleeding.  Inpatient Medications    Scheduled Meds:  aspirin  81 mg Oral Daily   empagliflozin  10 mg Oral QHS   metoprolol succinate  12.5 mg Oral Daily   pantoprazole  40 mg Oral Daily   potassium chloride SA  20 mEq Oral Daily   rosuvastatin  40 mg Oral Daily   sodium chloride flush  3 mL Intravenous Q12H   ticagrelor  90 mg Oral BID   traZODone  25 mg Oral QHS   Continuous Infusions:  sodium chloride     sodium chloride     nitroGLYCERIN 5 mcg/min (02/02/21 2359)   tirofiban 0.15 mcg/kg/min (02/03/21 0717)   PRN Meds: sodium chloride, acetaminophen, albuterol, diazepam, fluticasone, nitroGLYCERIN, ondansetron (ZOFRAN) IV, sodium chloride flush   Vital Signs    Vitals:   02/02/21 1231 02/02/21 1619 02/02/21 2120 02/03/21 0813  BP: 113/75 123/86 130/86 131/73  Pulse: 81 80  87  Resp:  16 18 18   Temp:  98.5 F (36.9 C) 97.8 F (36.6 C) 98.6 F (37 C)  TempSrc:  Oral Oral Oral  SpO2: 96% 95% 92% 94%  Weight:    88.4 kg  Height:        Intake/Output Summary (Last 24 hours) at 02/03/2021 0946 Last data filed at 02/03/2021 0800 Gross per 24 hour  Intake 1098.57 ml  Output 1625 ml  Net -526.43 ml   Last 3 Weights 02/03/2021 02/01/2021 02/01/2021  Weight (lbs) 194 lb 14.4 oz 200 lb 11.2 oz 199 lb 1.2 oz  Weight (kg) 88.406 kg 91.037 kg 90.3 kg      Telemetry    Normal sinus rhythm with rates in the 80s. - Personally Reviewed  ECG    Normal sinus rhythm,  rate 85 bpm, with slight ST elevation in V3-V4 with deep T wave inversions in V2-V5 as well as leads I and aVL. - Personally Reviewed  Physical Exam   GEN: No acute distress.   Neck: No JVD. Cardiac: RRR. No murmurs, rubs, or gallops. Right radial cath site soft with no signs of hematoma. Respiratory: Faint wheeze throughout. Otherwise, lungs clear. GI: Soft, non-distended, and non-tender.  MS: No lower extremity edema. No deformity. Skin: Warm and dry. Neuro:  No focal deficits. Psych: Normal affect. Responds appropriately.  Labs    High Sensitivity Troponin:   Recent Labs  Lab 02/01/21 1627 02/01/21 1827  TROPONINIHS 559* 3,429*      Chemistry Recent Labs  Lab 02/01/21 1627 02/02/21 0552  NA 135 134*  K 3.9 4.4  CL 99 100  CO2 25 24  GLUCOSE 158* 127*  BUN 19 19  CREATININE 1.26* 1.04  CALCIUM 9.7 9.2  GFRNONAA >60 >60  ANIONGAP 11 10     Hematology Recent Labs  Lab 02/01/21 1627 02/02/21 0552 02/03/21 0118  WBC 18.3* 15.3* 10.1  RBC 5.15 4.89 4.91  HGB 15.3 14.6 14.5  HCT 46.6 44.2 44.7  MCV 90.5 90.4 91.0  MCH 29.7  29.9 29.5  MCHC 32.8 33.0 32.4  RDW 14.2 14.6 14.7  PLT 276 224 235    BNPNo results for input(s): BNP, PROBNP in the last 168 hours.   DDimer No results for input(s): DDIMER in the last 168 hours.   Radiology    DG Chest 2 View  Result Date: 02/01/2021 CLINICAL DATA:  Chest pain. EXAM: CHEST - 2 VIEW COMPARISON:  Multiple priors, most recent December 01, 2020. FINDINGS: Emphysematous change and chronic peribronchial thickening. No new consolidation. No visible pleural effusions or pneumothorax. Biapical pleuroparenchymal scarring. The similar cardiomediastinal silhouette. Left dual lead subclavian approach cardiac rhythm maintenance device. IMPRESSION: 1. No evidence of acute cardiopulmonary disease. 2. Emphysema. Electronically Signed   By: Margaretha Sheffield MD   On: 02/01/2021 16:53   CARDIAC CATHETERIZATION  Result Date:  02/02/2021  Prox RCA lesion is 20% stenosed.  Prox LAD to Mid LAD lesion is 15% stenosed.  1st Diag lesion is 5% stenosed.  Ost LAD to Prox LAD lesion is 20% stenosed.  Dist LAD-1 lesion is 40% stenosed.  Dist LAD-2 lesion is 80% stenosed.  There is severe left ventricular systolic dysfunction.  LV end diastolic pressure is severely elevated.  The left ventricular ejection fraction is 25-35% by visual estimate.  Patent proximal LAD stent with mild 20% in-stent narrowing.  There is significant thrombus burden immediately after the stent extending into the diagonal vessel and mid LAD with TIMI-3 flow.  There is a 305 mid distal LAD stenosis and  distal 80% stenosis in a small caliber distal LAD. 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  The dominant RCA had improvement in the previous 70% stenosis which now appeared approximately 20%.  The RCA supplied the PDA and PLA vessel Acute LV dysfunction with EF estimated at 25 to 30% with severe hypocontractility involving the mid distal anterolateral wall apex and apical inferior segment.  Elevated LVEDP at 29 mmHg. RECOMMENDATION: Patient was started on Aggrastat in the catheterization laboratory and was bolused with Brilinta 180 mg orally.  He does not appear to have significant stenoses at the site of his thrombotic diagonal and LAD and plan continuation of Aggrastat for approximately 48 hours with restudy.  Of note, the patient had recently stopped his ticagrelor and a echo on December 14, 2020 had shown normalization of LV function without wall motion abnormalities.   ECHOCARDIOGRAM COMPLETE  Result Date: 02/02/2021    ECHOCARDIOGRAM REPORT   Patient Name:   Stephen Jennings Date of Exam: 02/02/2021 Medical Rec #:  [id number]     Height:       68.0 in Accession #:    [id number]    Weight:       200.7 lb Date of Birth:  [birth year]     BSA:          2.047 m Patient Age:    65 years       BP:           101/68 mmHg Patient Gender: M             HR:           81 bpm. Exam Location:  Inpatient Procedure: 2D Echo, Cardiac Doppler and Color Doppler Indications:    NSTEMI  History:        Patient has prior history of Echocardiogram examinations, most  recent 12/14/2020. Pacemaker; Risk Factors:Hypertension, Current                 Smoker and Diabetes.  Sonographer:    Cammy Brochure Referring Phys: [id number] MATTHEW A CARLISLE  Sonographer Comments: Image acquisition challenging due to patient body habitus. IMPRESSIONS  1. Akinesis of the anteroseptal wall and apex with overall moderate LV dysfunction.  2. Left ventricular ejection fraction, by estimation, is 35 to 40%. The left ventricle has moderately decreased function. The left ventricle demonstrates regional wall motion abnormalities (see scoring diagram/findings for description). Left ventricular  diastolic parameters are consistent with Grade II diastolic dysfunction (pseudonormalization).  3. Right ventricular systolic function is normal. The right ventricular size is normal.  4. The mitral valve is normal in structure. Mild mitral valve regurgitation. No evidence of mitral stenosis.  5. The aortic valve was not well visualized. Aortic valve regurgitation is not visualized. No aortic stenosis is present.  6. The inferior vena cava is normal in size with greater than 50% respiratory variability, suggesting right atrial pressure of 3 mmHg. FINDINGS  Left Ventricle: Left ventricular ejection fraction, by estimation, is 35 to 40%. The left ventricle has moderately decreased function. The left ventricle demonstrates regional wall motion abnormalities. Definity contrast agent was given IV to delineate the left ventricular endocardial borders. The left ventricular internal cavity size was normal in size. There is no left ventricular hypertrophy. Left ventricular diastolic parameters are consistent with Grade II diastolic dysfunction  (pseudonormalization). Right Ventricle: The right ventricular size is normal.Right ventricular systolic function is normal. Left Atrium: Left atrial size was normal in size. Right Atrium: Right atrial size was normal in size. Pericardium: There is no evidence of pericardial effusion. Mitral Valve: The mitral valve is normal in structure. Mild mitral annular calcification. Mild mitral valve regurgitation. No evidence of mitral valve stenosis. Tricuspid Valve: The tricuspid valve is normal in structure. Tricuspid valve regurgitation is trivial. No evidence of tricuspid stenosis. Aortic Valve: The aortic valve was not well visualized. Aortic valve regurgitation is not visualized. No aortic stenosis is present. Aortic valve mean gradient measures 3.0 mmHg. Aortic valve peak gradient measures 5.9 mmHg. Aortic valve area, by VTI measures 2.48 cm. Pulmonic Valve: The pulmonic valve was not well visualized. Pulmonic valve regurgitation is not visualized. No evidence of pulmonic stenosis. Aorta: The aortic root is normal in size and structure. Venous: The inferior vena cava is normal in size with greater than 50% respiratory variability, suggesting right atrial pressure of 3 mmHg. IAS/Shunts: No atrial level shunt detected by color flow Doppler. Additional Comments: Akinesis of the anteroseptal wall and apex with overall moderate LV dysfunction. A device lead is visualized.  LEFT VENTRICLE PLAX 2D LVIDd:         5.30 cm  Diastology LVIDs:         3.80 cm  LV e' medial:    7.07 cm/s LV PW:         1.10 cm  LV E/e' medial:  10.6 LV IVS:        1.10 cm  LV e' lateral:   8.05 cm/s LVOT diam:     1.90 cm  LV E/e' lateral: 9.3 LV SV:         48 LV SV Index:   23 LVOT Area:     2.84 cm  RIGHT VENTRICLE             IVC RV S prime:     12.00 cm/s  IVC  diam: 1.10 cm TAPSE (M-mode): 2.0 cm LEFT ATRIUM             Index       RIGHT ATRIUM           Index LA diam:        3.70 cm 1.81 cm/m  RA Area:     13.80 cm LA Vol (A2C):   57.3  ml 28.00 ml/m RA Volume:   33.10 ml  16.17 ml/m LA Vol (A4C):   46.8 ml 22.87 ml/m LA Biplane Vol: 55.5 ml 27.12 ml/m  AORTIC VALVE AV Area (Vmax):    2.41 cm AV Area (Vmean):   2.42 cm AV Area (VTI):     2.48 cm AV Vmax:           121.00 cm/s AV Vmean:          76.700 cm/s AV VTI:            0.193 m AV Peak Grad:      5.9 mmHg AV Mean Grad:      3.0 mmHg LVOT Vmax:         103.00 cm/s LVOT Vmean:        65.500 cm/s LVOT VTI:          0.169 m LVOT/AV VTI ratio: 0.88  AORTA Ao Root diam: 3.10 cm Ao Asc diam:  3.50 cm MITRAL VALVE MV Area (PHT): 4.26 cm    SHUNTS MV Decel Time: 178 msec    Systemic VTI:  0.17 m MV E velocity: 74.70 cm/s  Systemic Diam: 1.90 cm MV A velocity: 74.70 cm/s MV E/A ratio:  1.00 Kirk Ruths MD Electronically signed by Kirk Ruths MD Signature Date/Time: 02/02/2021/2:51:02 PM    Final     Cardiac Studies   Left Cardiac Catheterization 02/02/2021: Prox RCA lesion is 20% stenosed. Prox LAD to Mid LAD lesion is 15% stenosed. 1st Diag lesion is 5% stenosed. Ost LAD to Prox LAD lesion is 20% stenosed. Dist LAD-1 lesion is 40% stenosed. Dist LAD-2 lesion is 80% stenosed. There is severe left ventricular systolic dysfunction. LV end diastolic pressure is severely elevated. The left ventricular ejection fraction is 25-35% by visual estimate.   Patent proximal LAD stent with mild 20% in-stent narrowing.  There is significant thrombus burden immediately after the stent extending into the diagonal vessel and mid LAD with TIMI-3 flow.  There is a 305 mid distal LAD stenosis and  distal 80% stenosis in a small caliber distal LAD.   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  The dominant RCA had improvement in the previous 70% stenosis which now appeared approximately 20%.  The RCA supplied the PDA and PLA vessel   Acute LV dysfunction with EF estimated at 25 to 30% with severe  hypocontractility involving the mid distal anterolateral wall apex and apical inferior segment.  Elevated LVEDP at 29 mmHg.   Recommendation: Patient was started on Aggrastat in the catheterization laboratory and was bolused with Brilinta 180 mg orally.  He does not appear to have significant stenoses at the site of his thrombotic diagonal and LAD and plan continuation of Aggrastat for approximately 48 hours with restudy.  Of note, the patient had recently stopped his ticagrelor and a echo on December 14, 2020 had shown normalization of LV function without wall motion abnormalities.  Diagnostic Dominance: Right    _______________  Echocardiogram 02/02/2021: Impressions:  1. Akinesis of  the anteroseptal wall and apex with overall moderate LV  dysfunction.   2. Left ventricular ejection fraction, by estimation, is 35 to 40%. The  left ventricle has moderately decreased function. The left ventricle  demonstrates regional wall motion abnormalities (see scoring  diagram/findings for description). Left ventricular   diastolic parameters are consistent with Grade II diastolic dysfunction  (pseudonormalization).   3. Right ventricular systolic function is normal. The right ventricular  size is normal.   4. The mitral valve is normal in structure. Mild mitral valve  regurgitation. No evidence of mitral stenosis.   5. The aortic valve was not well visualized. Aortic valve regurgitation  is not visualized. No aortic stenosis is present.   6. The inferior vena cava is normal in size with greater than 50%  respiratory variability, suggesting right atrial pressure of 3 mmHg.   Patient Profile     65 y.o. male with a history of CAD with STEMI in 03/2019 s/p PCI with LAD complicated by cardiogenic shock, VT/Torsades cardiac arrest, s/p placement of St. Jude ICD in 03/2019 for secondary prevention, chronic combined CHF, COPD, hyperlipidemia, and diabetes mellitus who was admitted with NSTEMI after  presenting with recurrent chest pain.  Assessment & Plan    NSTEMI - Presented with left arm and chest pain. - EKG showed biphasic T wave in lateral leads.  - High-sensitivity troponin peaked at 3,429.  - Echo showed LVEF of 35-40% with akinesis of the anteroseptal wall. - LHC on 02/02/2021 patent proximal LAD stent with mild 20% in-stent narrowing but significant thrombus burden immediately after stent extending in to the diagonal vessel and mid LAD. Also 80% stenosis of small caliber distal LAD. Otherwise non-obstructive disease. Patient was bolused with Brilinta and started on Aggrastat. Plan is for Aggrastat for 48 hours and then relook cath.  - Continue DAPT with Aspirin and Brilinta. Of note, patient has had some shortness of breath that he is convinced is from the [place]. He would like to be on something else. Will continue Brilinta for now until relook cath. Can consider switching to Effient depending on the results. Would not use Plavix given thrombus burden. - Continue beta-blocker and high-intensity statin.   The patient understands that risks include but are not limited to stroke (1 in 1000), death (1 in 36), kidney failure [usually temporary] (1 in 500), bleeding (1 in 200), allergic reaction [possibly serious] (1 in 200), and agrees to proceed.   Chronic Combined CHF - Echo showed LVEF of 35-40% with grade 2 diastolic dysfunction. - LVEDP was elevated at 29 mmHg on cath yesterday.  - He does reports some mild shortness of breath since yesterday but is laying flat comfortably at this time. - Will give one dose of IV Lasix 40mg  daily with plans to start PO Lasix 40mg  tomorrow. - Home Entresto 49-51mg  twice daily and Eplerenone 25mg  daily on hold for cardiac catheterization. BP well controlled. Can restart after cath tomorrow. - Continue Toprol-XL 12.5mg  daily. - Continue Jardiance 10mg  daily.  S/p ICD - S/p St Jude ICD in 03/2019 for secondary prevention after  VT/Torsades/cardiac arrest in 03/2019. - Followed by Dr. Curt Bears.  COPD - Slight wheezing today. - Will give albuterol nebulizer today.  Hypertension - BP well controlled. - Continue medications for CHF as above.  Hyperlipidemia - Lipid panel this admission: Total Cholesterol 221, Triglycerides 89, HDL 53, LDL 150.  - LDL goal <70 given CAD. - Continue Crestor 40mg  daily. Will add Zetia 10mg  daily.  - Recommend  referral to lipid clinic for consideration of PCSK9 inhibitor.  Type 2 Diabetes Mellitus  - Hemoglobin A1c 6.4.  - Continue Jardiance.      For questions or updates, please contact [place] Please consult [email or web address] for contact info under        Signed, Darreld Mclean, [name]  02/03/2021, 9:46 AM     Agree with note by Sande Rives, [name]  Mr. Kranz had cardiac catheterization performed radially by Dr. Claiborne Billings yesterday revealing an extensive thrombus burden within the LAD and diagonal branch.  The RCA and anomalous circumflex were free of significant disease.  He had stopped his ticagrelor 6 weeks ago because of side effects of shortness of breath which resolved after discontinuing the drug.  He was placed on Aggrastat for 48 hours with plans to relook tomorrow.  I suspect he would be a good candidate for Effient.  His exam is benign today except for scattered wheezes.  We will begin him on some inhaled bronchodilators.  Because of his LVEDP elevation we will also give him IV diuretics followed by oral diuretics.   Lorretta Harp, M.D., [place], [facility], [place] [address] [place], [place]  [id number]  [phone] 02/03/2021 10:45 AM

## 2021-02-03 NOTE — Progress Notes (Addendum)
Progress Note  Patient Name: Stephen Jennings Date of Encounter: 02/03/2021  [facility] Cardiologist: Dr. Ardyth Man. Jennings  Subjective   No acute overnight events. He notes some shortness of breath since yesterday which he thinks he is due to the [place]. He is laying almost completely flat at this time and look comfortably. No chest pain. He also reports night sweats which has been going on for 2 years. He has already talked about this with his PCP. No fevers, body chills, weight loss, or abnormal bleeding.  Inpatient Medications    Scheduled Meds:  aspirin  81 mg Oral Daily   empagliflozin  10 mg Oral QHS   metoprolol succinate  12.5 mg Oral Daily   pantoprazole  40 mg Oral Daily   potassium chloride SA  20 mEq Oral Daily   rosuvastatin  40 mg Oral Daily   sodium chloride flush  3 mL Intravenous Q12H   ticagrelor  90 mg Oral BID   traZODone  25 mg Oral QHS   Continuous Infusions:  sodium chloride     sodium chloride     nitroGLYCERIN 5 mcg/min (02/02/21 2359)   tirofiban 0.15 mcg/kg/min (02/03/21 0717)   PRN Meds: sodium chloride, acetaminophen, albuterol, diazepam, fluticasone, nitroGLYCERIN, ondansetron (ZOFRAN) IV, sodium chloride flush   Vital Signs    Vitals:   02/02/21 1231 02/02/21 1619 02/02/21 2120 02/03/21 0813  BP: 113/75 123/86 130/86 131/73  Pulse: 81 80  87  Resp:  16 18 18   Temp:  98.5 F (36.9 C) 97.8 F (36.6 C) 98.6 F (37 C)  TempSrc:  Oral Oral Oral  SpO2: 96% 95% 92% 94%  Weight:    88.4 kg  Height:        Intake/Output Summary (Last 24 hours) at 02/03/2021 0946 Last data filed at 02/03/2021 0800 Gross per 24 hour  Intake 1098.57 ml  Output 1625 ml  Net -526.43 ml   Last 3 Weights 02/03/2021 02/01/2021 02/01/2021  Weight (lbs) 194 lb 14.4 oz 200 lb 11.2 oz 199 lb 1.2 oz  Weight (kg) 88.406 kg 91.037 kg 90.3 kg      Telemetry    Normal sinus rhythm with rates in the 80s. - Personally Reviewed  ECG    Normal sinus rhythm,  rate 85 bpm, with slight ST elevation in V3-V4 with deep T wave inversions in V2-V5 as well as leads I and aVL. - Personally Reviewed  Physical Exam   GEN: No acute distress.   Neck: No JVD. Cardiac: RRR. No murmurs, rubs, or gallops. Right radial cath site soft with no signs of hematoma. Respiratory: Faint wheeze throughout. Otherwise, lungs clear. GI: Soft, non-distended, and non-tender.  MS: No lower extremity edema. No deformity. Skin: Warm and dry. Neuro:  No focal deficits. Psych: Normal affect. Responds appropriately.  Labs    High Sensitivity Troponin:   Recent Labs  Lab 02/01/21 1627 02/01/21 1827  TROPONINIHS 559* 3,429*      Chemistry Recent Labs  Lab 02/01/21 1627 02/02/21 0552  NA 135 134*  K 3.9 4.4  CL 99 100  CO2 25 24  GLUCOSE 158* 127*  BUN 19 19  CREATININE 1.26* 1.04  CALCIUM 9.7 9.2  GFRNONAA >60 >60  ANIONGAP 11 10     Hematology Recent Labs  Lab 02/01/21 1627 02/02/21 0552 02/03/21 0118  WBC 18.3* 15.3* 10.1  RBC 5.15 4.89 4.91  HGB 15.3 14.6 14.5  HCT 46.6 44.2 44.7  MCV 90.5 90.4 91.0  MCH 29.7  29.9 29.5  MCHC 32.8 33.0 32.4  RDW 14.2 14.6 14.7  PLT 276 224 235    BNPNo results for input(s): BNP, PROBNP in the last 168 hours.   DDimer No results for input(s): DDIMER in the last 168 hours.   Radiology    DG Chest 2 View  Result Date: 02/01/2021 CLINICAL DATA:  Chest pain. EXAM: CHEST - 2 VIEW COMPARISON:  Multiple priors, most recent December 01, 2020. FINDINGS: Emphysematous change and chronic peribronchial thickening. No new consolidation. No visible pleural effusions or pneumothorax. Biapical pleuroparenchymal scarring. The similar cardiomediastinal silhouette. Left dual lead subclavian approach cardiac rhythm maintenance device. IMPRESSION: 1. No evidence of acute cardiopulmonary disease. 2. Emphysema. Electronically Signed   By: Margaretha Sheffield MD   On: 02/01/2021 16:53   CARDIAC CATHETERIZATION  Result Date:  02/02/2021  Prox RCA lesion is 20% stenosed.  Prox LAD to Mid LAD lesion is 15% stenosed.  1st Diag lesion is 5% stenosed.  Ost LAD to Prox LAD lesion is 20% stenosed.  Dist LAD-1 lesion is 40% stenosed.  Dist LAD-2 lesion is 80% stenosed.  There is severe left ventricular systolic dysfunction.  LV end diastolic pressure is severely elevated.  The left ventricular ejection fraction is 25-35% by visual estimate.  Patent proximal LAD stent with mild 20% in-stent narrowing.  There is significant thrombus burden immediately after the stent extending into the diagonal vessel and mid LAD with TIMI-3 flow.  There is a 305 mid distal LAD stenosis and  distal 80% stenosis in a small caliber distal LAD. 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  The dominant RCA had improvement in the previous 70% stenosis which now appeared approximately 20%.  The RCA supplied the PDA and PLA vessel Acute LV dysfunction with EF estimated at 25 to 30% with severe hypocontractility involving the mid distal anterolateral wall apex and apical inferior segment.  Elevated LVEDP at 29 mmHg. RECOMMENDATION: Patient was started on Aggrastat in the catheterization laboratory and was bolused with Brilinta 180 mg orally.  He does not appear to have significant stenoses at the site of his thrombotic diagonal and LAD and plan continuation of Aggrastat for approximately 48 hours with restudy.  Of note, the patient had recently stopped his ticagrelor and a echo on December 14, 2020 had shown normalization of LV function without wall motion abnormalities.   ECHOCARDIOGRAM COMPLETE  Result Date: 02/02/2021    ECHOCARDIOGRAM REPORT   Patient Name:   Stephen Jennings Date of Exam: 02/02/2021 Medical Rec #:  [id number]     Height:       68.0 in Accession #:    [id number]    Weight:       200.7 lb Date of Birth:  1955/09/11     BSA:          2.047 m Patient Age:    65 years       BP:           101/68 mmHg Patient Gender: M             HR:           81 bpm. Exam Location:  Inpatient Procedure: 2D Echo, Cardiac Doppler and Color Doppler Indications:    NSTEMI  History:        Patient has prior history of Echocardiogram examinations, most  recent 12/14/2020. Pacemaker; Risk Factors:Hypertension, Current                 Smoker and Diabetes.  Sonographer:    Cammy Brochure Referring Phys: [id number] MATTHEW A CARLISLE  Sonographer Comments: Image acquisition challenging due to patient body habitus. IMPRESSIONS  1. Akinesis of the anteroseptal wall and apex with overall moderate LV dysfunction.  2. Left ventricular ejection fraction, by estimation, is 35 to 40%. The left ventricle has moderately decreased function. The left ventricle demonstrates regional wall motion abnormalities (see scoring diagram/findings for description). Left ventricular  diastolic parameters are consistent with Grade II diastolic dysfunction (pseudonormalization).  3. Right ventricular systolic function is normal. The right ventricular size is normal.  4. The mitral valve is normal in structure. Mild mitral valve regurgitation. No evidence of mitral stenosis.  5. The aortic valve was not well visualized. Aortic valve regurgitation is not visualized. No aortic stenosis is present.  6. The inferior vena cava is normal in size with greater than 50% respiratory variability, suggesting right atrial pressure of 3 mmHg. FINDINGS  Left Ventricle: Left ventricular ejection fraction, by estimation, is 35 to 40%. The left ventricle has moderately decreased function. The left ventricle demonstrates regional wall motion abnormalities. Definity contrast agent was given IV to delineate the left ventricular endocardial borders. The left ventricular internal cavity size was normal in size. There is no left ventricular hypertrophy. Left ventricular diastolic parameters are consistent with Grade II diastolic dysfunction  (pseudonormalization). Right Ventricle: The right ventricular size is normal.Right ventricular systolic function is normal. Left Atrium: Left atrial size was normal in size. Right Atrium: Right atrial size was normal in size. Pericardium: There is no evidence of pericardial effusion. Mitral Valve: The mitral valve is normal in structure. Mild mitral annular calcification. Mild mitral valve regurgitation. No evidence of mitral valve stenosis. Tricuspid Valve: The tricuspid valve is normal in structure. Tricuspid valve regurgitation is trivial. No evidence of tricuspid stenosis. Aortic Valve: The aortic valve was not well visualized. Aortic valve regurgitation is not visualized. No aortic stenosis is present. Aortic valve mean gradient measures 3.0 mmHg. Aortic valve peak gradient measures 5.9 mmHg. Aortic valve area, by VTI measures 2.48 cm. Pulmonic Valve: The pulmonic valve was not well visualized. Pulmonic valve regurgitation is not visualized. No evidence of pulmonic stenosis. Aorta: The aortic root is normal in size and structure. Venous: The inferior vena cava is normal in size with greater than 50% respiratory variability, suggesting right atrial pressure of 3 mmHg. IAS/Shunts: No atrial level shunt detected by color flow Doppler. Additional Comments: Akinesis of the anteroseptal wall and apex with overall moderate LV dysfunction. A device lead is visualized.  LEFT VENTRICLE PLAX 2D LVIDd:         5.30 cm  Diastology LVIDs:         3.80 cm  LV e' medial:    7.07 cm/s LV PW:         1.10 cm  LV E/e' medial:  10.6 LV IVS:        1.10 cm  LV e' lateral:   8.05 cm/s LVOT diam:     1.90 cm  LV E/e' lateral: 9.3 LV SV:         48 LV SV Index:   23 LVOT Area:     2.84 cm  RIGHT VENTRICLE             IVC RV S prime:     12.00 cm/s  IVC  diam: 1.10 cm TAPSE (M-mode): 2.0 cm LEFT ATRIUM             Index       RIGHT ATRIUM           Index LA diam:        3.70 cm 1.81 cm/m  RA Area:     13.80 cm LA Vol (A2C):   57.3  ml 28.00 ml/m RA Volume:   33.10 ml  16.17 ml/m LA Vol (A4C):   46.8 ml 22.87 ml/m LA Biplane Vol: 55.5 ml 27.12 ml/m  AORTIC VALVE AV Area (Vmax):    2.41 cm AV Area (Vmean):   2.42 cm AV Area (VTI):     2.48 cm AV Vmax:           121.00 cm/s AV Vmean:          76.700 cm/s AV VTI:            0.193 m AV Peak Grad:      5.9 mmHg AV Mean Grad:      3.0 mmHg LVOT Vmax:         103.00 cm/s LVOT Vmean:        65.500 cm/s LVOT VTI:          0.169 m LVOT/AV VTI ratio: 0.88  AORTA Ao Root diam: 3.10 cm Ao Asc diam:  3.50 cm MITRAL VALVE MV Area (PHT): 4.26 cm    SHUNTS MV Decel Time: 178 msec    Systemic VTI:  0.17 m MV E velocity: 74.70 cm/s  Systemic Diam: 1.90 cm MV A velocity: 74.70 cm/s MV E/A ratio:  1.00 Kirk Ruths MD Electronically signed by Kirk Ruths MD Signature Date/Time: 02/02/2021/2:51:02 PM    Final     Cardiac Studies   Left Cardiac Catheterization 02/02/2021: Prox RCA lesion is 20% stenosed. Prox LAD to Mid LAD lesion is 15% stenosed. 1st Diag lesion is 5% stenosed. Ost LAD to Prox LAD lesion is 20% stenosed. Dist LAD-1 lesion is 40% stenosed. Dist LAD-2 lesion is 80% stenosed. There is severe left ventricular systolic dysfunction. LV end diastolic pressure is severely elevated. The left ventricular ejection fraction is 25-35% by visual estimate.   Patent proximal LAD stent with mild 20% in-stent narrowing.  There is significant thrombus burden immediately after the stent extending into the diagonal vessel and mid LAD with TIMI-3 flow.  There is a 305 mid distal LAD stenosis and  distal 80% stenosis in a small caliber distal LAD.   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  The dominant RCA had improvement in the previous 70% stenosis which now appeared approximately 20%.  The RCA supplied the PDA and PLA vessel   Acute LV dysfunction with EF estimated at 25 to 30% with severe  hypocontractility involving the mid distal anterolateral wall apex and apical inferior segment.  Elevated LVEDP at 29 mmHg.   Recommendation: Patient was started on Aggrastat in the catheterization laboratory and was bolused with Brilinta 180 mg orally.  He does not appear to have significant stenoses at the site of his thrombotic diagonal and LAD and plan continuation of Aggrastat for approximately 48 hours with restudy.  Of note, the patient had recently stopped his ticagrelor and a echo on December 14, 2020 had shown normalization of LV function without wall motion abnormalities.  Diagnostic Dominance: Right    _______________  Echocardiogram 02/02/2021: Impressions:  1. Akinesis of  the anteroseptal wall and apex with overall moderate LV  dysfunction.   2. Left ventricular ejection fraction, by estimation, is 35 to 40%. The  left ventricle has moderately decreased function. The left ventricle  demonstrates regional wall motion abnormalities (see scoring  diagram/findings for description). Left ventricular   diastolic parameters are consistent with Grade II diastolic dysfunction  (pseudonormalization).   3. Right ventricular systolic function is normal. The right ventricular  size is normal.   4. The mitral valve is normal in structure. Mild mitral valve  regurgitation. No evidence of mitral stenosis.   5. The aortic valve was not well visualized. Aortic valve regurgitation  is not visualized. No aortic stenosis is present.   6. The inferior vena cava is normal in size with greater than 50%  respiratory variability, suggesting right atrial pressure of 3 mmHg.   Patient Profile     65 y.o. male with a history of CAD with STEMI in 03/2019 s/p PCI with LAD complicated by cardiogenic shock, VT/Torsades cardiac arrest, s/p placement of St. Jude ICD in 03/2019 for secondary prevention, chronic combined CHF, COPD, hyperlipidemia, and diabetes mellitus who was admitted with NSTEMI after  presenting with recurrent chest pain.  Assessment & Plan    NSTEMI - Presented with left arm and chest pain. - EKG showed biphasic T wave in lateral leads.  - High-sensitivity troponin peaked at 3,429.  - Echo showed LVEF of 35-40% with akinesis of the anteroseptal wall. - LHC on 02/02/2021 patent proximal LAD stent with mild 20% in-stent narrowing but significant thrombus burden immediately after stent extending in to the diagonal vessel and mid LAD. Also 80% stenosis of small caliber distal LAD. Otherwise non-obstructive disease. Patient was bolused with Brilinta and started on Aggrastat. Plan is for Aggrastat for 48 hours and then relook cath.  - Continue DAPT with Aspirin and Brilinta. Of note, patient has had some shortness of breath that he is convinced is from the [place]. He would like to be on something else. Will continue Brilinta for now until relook cath. Can consider switching to Effient depending on the results. Would not use Plavix given thrombus burden. - Continue beta-blocker and high-intensity statin.   The patient understands that risks include but are not limited to stroke (1 in 1000), death (1 in 4), kidney failure [usually temporary] (1 in 500), bleeding (1 in 200), allergic reaction [possibly serious] (1 in 200), and agrees to proceed.   Chronic Combined CHF - Echo showed LVEF of 35-40% with grade 2 diastolic dysfunction. - LVEDP was elevated at 29 mmHg on cath yesterday.  - He does reports some mild shortness of breath since yesterday but is laying flat comfortably at this time. - Will give one dose of IV Lasix 40mg  daily with plans to start PO Lasix 40mg  tomorrow. - Home Entresto 49-51mg  twice daily and Eplerenone 25mg  daily on hold for cardiac catheterization. BP well controlled. Can restart after cath tomorrow. - Continue Toprol-XL 12.5mg  daily. - Continue Jardiance 10mg  daily.  S/p ICD - S/p St Jude ICD in 03/2019 for secondary prevention after  VT/Torsades/cardiac arrest in 03/2019. - Followed by Dr. Curt Bears.  COPD - Slight wheezing today. - Will give albuterol nebulizer today.  Hypertension - BP well controlled. - Continue medications for CHF as above.  Hyperlipidemia - Lipid panel this admission: Total Cholesterol 221, Triglycerides 89, HDL 53, LDL 150.  - LDL goal <70 given CAD. - Continue Crestor 40mg  daily. Will add Zetia 10mg  daily.  - Recommend  referral to lipid clinic for consideration of PCSK9 inhibitor.  Type 2 Diabetes Mellitus  - Hemoglobin A1c 6.4.  - Continue Jardiance.      For questions or updates, please contact [place] Please consult [email or web address] for contact info under        Signed, Darreld Mclean, [name]  02/03/2021, 9:46 AM     Agree with note by Sande Rives, [name]  Mr. Sturgell had cardiac catheterization performed radially by Dr. Claiborne Billings yesterday revealing an extensive thrombus burden within the LAD and diagonal branch.  The RCA and anomalous circumflex were free of significant disease.  He had stopped his ticagrelor 6 weeks ago because of side effects of shortness of breath which resolved after discontinuing the drug.  He was placed on Aggrastat for 48 hours with plans to relook tomorrow.  I suspect he would be a good candidate for Effient.  His exam is benign today except for scattered wheezes.  We will begin him on some inhaled bronchodilators.  Because of his LVEDP elevation we will also give him IV diuretics followed by oral diuretics.   Lorretta Harp, M.D., [place], [facility], [place] [address] [place], [place]  [id number]  [phone] 02/03/2021 10:45 AM

## 2021-02-04 ENCOUNTER — Encounter (HOSPITAL_COMMUNITY)
Admission: EM | Disposition: A | Discharge status: Home / Self Care | Payer: Self-pay | Source: Home / Self Care | Attending: Cardiology

## 2021-02-04 ENCOUNTER — Encounter (HOSPITAL_COMMUNITY): Payer: Self-pay | Admitting: Cardiovascular Disease

## 2021-02-04 DIAGNOSIS — I110 Hypertensive heart disease with heart failure: Secondary | ICD-10-CM | POA: Diagnosis not present

## 2021-02-04 DIAGNOSIS — T82855A Stenosis of coronary artery stent, initial encounter: Secondary | ICD-10-CM | POA: Diagnosis not present

## 2021-02-04 DIAGNOSIS — I5042 Chronic combined systolic (congestive) and diastolic (congestive) heart failure: Secondary | ICD-10-CM | POA: Diagnosis not present

## 2021-02-04 DIAGNOSIS — I214 Non-ST elevation (NSTEMI) myocardial infarction: Secondary | ICD-10-CM | POA: Diagnosis not present

## 2021-02-04 DIAGNOSIS — J439 Emphysema, unspecified: Secondary | ICD-10-CM | POA: Diagnosis not present

## 2021-02-04 DIAGNOSIS — I25110 Atherosclerotic heart disease of native coronary artery with unstable angina pectoris: Secondary | ICD-10-CM | POA: Diagnosis not present

## 2021-02-04 DIAGNOSIS — I959 Hypotension, unspecified: Secondary | ICD-10-CM | POA: Diagnosis not present

## 2021-02-04 DIAGNOSIS — Z20822 Contact with and (suspected) exposure to covid-19: Secondary | ICD-10-CM | POA: Diagnosis not present

## 2021-02-04 DIAGNOSIS — Z6831 Body mass index (BMI) 31.0-31.9, adult: Secondary | ICD-10-CM | POA: Diagnosis not present

## 2021-02-04 DIAGNOSIS — E119 Type 2 diabetes mellitus without complications: Secondary | ICD-10-CM | POA: Diagnosis not present

## 2021-02-04 DIAGNOSIS — N179 Acute kidney failure, unspecified: Secondary | ICD-10-CM | POA: Diagnosis not present

## 2021-02-04 DIAGNOSIS — I2510 Atherosclerotic heart disease of native coronary artery without angina pectoris: Secondary | ICD-10-CM | POA: Diagnosis not present

## 2021-02-04 DIAGNOSIS — E669 Obesity, unspecified: Secondary | ICD-10-CM | POA: Diagnosis not present

## 2021-02-04 HISTORY — PX: INTRAVASCULAR ULTRASOUND/IVUS: CATH118244

## 2021-02-04 HISTORY — PX: LEFT HEART CATH AND CORONARY ANGIOGRAPHY: CATH118249

## 2021-02-04 HISTORY — PX: CORONARY THROMBECTOMY: CATH118304

## 2021-02-04 HISTORY — PX: CORONARY BALLOON ANGIOPLASTY: CATH118233

## 2021-02-04 LAB — CBC
HCT: 48.3 % (ref 39.0–52.0)
Hemoglobin: 15.9 g/dL (ref 13.0–17.0)
MCH: 29.5 pg (ref 26.0–34.0)
MCHC: 32.9 g/dL (ref 30.0–36.0)
MCV: 89.6 fL (ref 80.0–100.0)
Platelets: 295 10*3/uL (ref 150–400)
RBC: 5.39 MIL/uL (ref 4.22–5.81)
RDW: 14.2 % (ref 11.5–15.5)
WBC: 9.7 10*3/uL (ref 4.0–10.5)
nRBC: 0 % (ref 0.0–0.2)

## 2021-02-04 LAB — POCT ACTIVATED CLOTTING TIME
Activated Clotting Time: 248 seconds
Activated Clotting Time: 329 seconds
Activated Clotting Time: 567 seconds
Activated Clotting Time: 306 seconds

## 2021-02-04 SURGERY — LEFT HEART CATH AND CORONARY ANGIOGRAPHY
Anesthesia: LOCAL

## 2021-02-04 MED ORDER — SODIUM CHLORIDE 0.9% FLUSH
3.0000 mL | Freq: Two times a day (BID) | INTRAVENOUS | Status: DC
  Administered 2021-02-04 – 2021-02-06 (×4): 3 mL via INTRAVENOUS

## 2021-02-04 MED ORDER — TIROFIBAN HCL IN NACL 5-0.9 MG/100ML-% IV SOLN: INTRAVENOUS | Status: DC | PRN

## 2021-02-04 MED ORDER — FENTANYL CITRATE (PF) 100 MCG/2ML IJ SOLN
INTRAMUSCULAR | Status: DC | PRN
  Administered 2021-02-04: 25 ug via INTRAVENOUS

## 2021-02-04 MED ORDER — TICAGRELOR 90 MG PO TABS
90.0000 mg | ORAL_TABLET | Freq: Two times a day (BID) | ORAL | Status: DC
  Administered 2021-02-04 – 2021-02-06 (×5): 90 mg via ORAL
  Filled 2021-02-04 (×4): qty 1

## 2021-02-04 MED ORDER — TIROFIBAN HCL IN NACL 5-0.9 MG/100ML-% IV SOLN
0.1500 ug/kg/min | INTRAVENOUS | Status: AC
  Administered 2021-02-04 – 2021-02-05 (×5): 0.15 ug/kg/min via INTRAVENOUS
  Filled 2021-02-04 (×6): qty 100

## 2021-02-04 MED ORDER — LABETALOL HCL 5 MG/ML IV SOLN: 10.0000 mg | INTRAVENOUS | Status: AC | PRN

## 2021-02-04 MED ORDER — MIDAZOLAM HCL 2 MG/2ML IJ SOLN
INTRAMUSCULAR | Status: AC
  Filled 2021-02-04: qty 2

## 2021-02-04 MED ORDER — TIROFIBAN HCL IN NACL 5-0.9 MG/100ML-% IV SOLN
INTRAVENOUS | Status: AC
  Filled 2021-02-04: qty 100

## 2021-02-04 MED ORDER — NITROGLYCERIN IN D5W 200-5 MCG/ML-% IV SOLN: 5.0000 ug/min | INTRAVENOUS | Status: DC

## 2021-02-04 MED ORDER — ASPIRIN 81 MG PO CHEW
81.0000 mg | CHEWABLE_TABLET | Freq: Every day | ORAL | Status: DC
  Administered 2021-02-04 – 2021-02-06 (×3): 81 mg via ORAL
  Filled 2021-02-04 (×2): qty 1

## 2021-02-04 MED ORDER — LIDOCAINE HCL (PF) 1 % IJ SOLN
INTRAMUSCULAR | Status: AC
  Filled 2021-02-04: qty 30

## 2021-02-04 MED ORDER — SODIUM CHLORIDE 0.9% FLUSH: 3.0000 mL | INTRAVENOUS | Status: DC | PRN

## 2021-02-04 MED ORDER — HEPARIN SODIUM (PORCINE) 1000 UNIT/ML IJ SOLN
INTRAMUSCULAR | Status: AC
  Filled 2021-02-04: qty 1

## 2021-02-04 MED ORDER — LIDOCAINE HCL (PF) 1 % IJ SOLN
INTRAMUSCULAR | Status: DC | PRN
  Administered 2021-02-04: 2 mL

## 2021-02-04 MED ORDER — ONDANSETRON HCL 4 MG/2ML IJ SOLN: 4.0000 mg | Freq: Four times a day (QID) | INTRAMUSCULAR | Status: DC | PRN

## 2021-02-04 MED ORDER — VERAPAMIL HCL 2.5 MG/ML IV SOLN
INTRAVENOUS | Status: AC
  Filled 2021-02-04: qty 2

## 2021-02-04 MED ORDER — ACETAMINOPHEN 325 MG PO TABS: 650.0000 mg | ORAL_TABLET | ORAL | Status: DC | PRN

## 2021-02-04 MED ORDER — SODIUM CHLORIDE 0.9 % IV SOLN
INTRAVENOUS | Status: DC
  Administered 2021-02-04: 500 mL via INTRAVENOUS

## 2021-02-04 MED ORDER — MIDAZOLAM HCL 2 MG/2ML IJ SOLN
INTRAMUSCULAR | Status: DC | PRN
  Administered 2021-02-04: 2 mg via INTRAVENOUS
  Administered 2021-02-04: 1 mg via INTRAVENOUS

## 2021-02-04 MED ORDER — HEPARIN SODIUM (PORCINE) 1000 UNIT/ML IJ SOLN
INTRAMUSCULAR | Status: DC | PRN
  Administered 2021-02-04: 5000 [IU] via INTRAVENOUS
  Administered 2021-02-04: 3000 [IU] via INTRAVENOUS
  Administered 2021-02-04: 4500 [IU] via INTRAVENOUS

## 2021-02-04 MED ORDER — HYDRALAZINE HCL 20 MG/ML IJ SOLN: 10.0000 mg | INTRAMUSCULAR | Status: AC | PRN

## 2021-02-04 MED ORDER — FENTANYL CITRATE (PF) 100 MCG/2ML IJ SOLN
INTRAMUSCULAR | Status: AC
  Filled 2021-02-04: qty 2

## 2021-02-04 MED ORDER — NITROGLYCERIN 1 MG/10 ML FOR IR/CATH LAB
INTRA_ARTERIAL | Status: AC
  Filled 2021-02-04: qty 10

## 2021-02-04 MED ORDER — SODIUM CHLORIDE 0.9 % IV SOLN: 250.0000 mL | INTRAVENOUS | Status: DC | PRN

## 2021-02-04 MED ORDER — IOHEXOL 350 MG/ML SOLN
INTRAVENOUS | Status: DC | PRN
  Administered 2021-02-04: 95 mL

## 2021-02-04 MED ORDER — VERAPAMIL HCL 2.5 MG/ML IV SOLN
INTRAVENOUS | Status: DC | PRN
  Administered 2021-02-04: 10 mL via INTRA_ARTERIAL

## 2021-02-04 MED ORDER — HEPARIN (PORCINE) IN NACL 1000-0.9 UT/500ML-% IV SOLN
INTRAVENOUS | Status: DC | PRN
  Administered 2021-02-04 (×2): 500 mL

## 2021-02-04 MED ORDER — DIAZEPAM 5 MG PO TABS: 5.0000 mg | ORAL_TABLET | ORAL | Status: DC | PRN

## 2021-02-04 MED ORDER — NITROGLYCERIN 1 MG/10 ML FOR IR/CATH LAB
INTRA_ARTERIAL | Status: DC | PRN
  Administered 2021-02-04: 100 ug via INTRACORONARY
  Administered 2021-02-04 (×2): 200 ug via INTRACORONARY

## 2021-02-04 MED ORDER — HEPARIN (PORCINE) IN NACL 1000-0.9 UT/500ML-% IV SOLN
INTRAVENOUS | Status: AC
  Filled 2021-02-04: qty 1000

## 2021-02-04 SURGICAL SUPPLY — 18 items
BALLN SAPPHIRE 3.0X12 (BALLOONS) ×2
BALLOON SAPPHIRE 3.0X12 (BALLOONS) IMPLANT
CATH EXTRAC PRONTO 5.5F 138CM (CATHETERS) ×1 IMPLANT
CATH OPTICROSS HD (CATHETERS) ×1 IMPLANT
CATH VISTA GUIDE 6FR XBLAD3.5 (CATHETERS) ×1 IMPLANT
DEVICE RAD COMP TR BAND LRG (VASCULAR PRODUCTS) ×1 IMPLANT
GLIDESHEATH SLEND SS 6F .021 (SHEATH) ×1 IMPLANT
GUIDEWIRE INQWIRE 1.5J.035X260 (WIRE) IMPLANT
INQWIRE 1.5J .035X260CM (WIRE) ×2
KIT ENCORE 26 ADVANTAGE (KITS) ×1 IMPLANT
KIT ESSENTIALS PG (KITS) ×1 IMPLANT
KIT HEART LEFT (KITS) ×2 IMPLANT
PACK CARDIAC CATHETERIZATION (CUSTOM PROCEDURE TRAY) ×2 IMPLANT
SHEATH PROBE COVER 6X72 (BAG) ×1 IMPLANT
SLED PULL BACK IVUS (MISCELLANEOUS) ×1 IMPLANT
TRANSDUCER W/STOPCOCK (MISCELLANEOUS) ×2 IMPLANT
TUBING CIL FLEX 10 FLL-RA (TUBING) ×2 IMPLANT
WIRE ASAHI PROWATER 180CM (WIRE) ×1 IMPLANT

## 2021-02-04 NOTE — Interval H&P Note (Signed)
Cath Lab Visit (complete for each Cath Lab visit)  Clinical Evaluation Leading to the Procedure:   ACS: Yes.    Non-ACS:    Anginal Classification: CCS III  Anti-ischemic medical therapy: Maximal Therapy (2 or more classes of medications)  Non-Invasive Test Results: No non-invasive testing performed  Prior CABG: No previous CABG      History and Physical Interval Note:  02/04/2021 7:51 AM  Stephen Jennings  has presented today for surgery, with the diagnosis of relook.  The various methods of treatment have been discussed with the patient and family. After consideration of risks, benefits and other options for treatment, the patient has consented to  Procedure(s): LEFT HEART CATH AND CORONARY ANGIOGRAPHY (N/A) as a surgical intervention.  The patient's history has been reviewed, patient examined, no change in status, stable for surgery.  I have reviewed the patient's chart and labs.  Questions were answered to the patient's satisfaction.     Shelva Majestic

## 2021-02-04 NOTE — Progress Notes (Signed)
ANTICOAGULATION CONSULT NOTE - Follow Up Consult  Pharmacy Consult for aggrastat Indication:  NSTEMI  Labs: Recent Labs    02/01/21 1627 02/01/21 1827 02/02/21 0552 02/03/21 0118 02/03/21 0958 02/04/21 0437  HGB 15.3  --  14.6 14.5  --  15.9  HCT 46.6  --  44.2 44.7  --  48.3  PLT 276  --  224 235  --  295  LABPROT 12.8  --   --   --   --   --   INR 1.0  --   --   --   --   --   HEPARINUNFRC  --   --  0.14*  --   --   --   CREATININE 1.26*  --  1.04  --  1.15  --   TROPONINIHS 559* 3,429*  --   --   --   --      Assessment: 65yo male admitted as NSTEMI. Pt recently completed ticagrelor course outpatient. LHC today revealed thrombus burden, pharmacy asked to dose tirofiban x48h with plans for relook cath.   CBC/platelet count stable  Now s/p re-cath with thrombectomy to LAD with PTCA.  Planning aggrastat x 24-36 additional hours, DAPT indefinitely.   Plan:  Tirofiban 0.15 mcg/kg/min x 24-36 hrs. Watch CBC  [place] Crane, Roylene Reason, [facility] Clinical Pharmacist  02/04/2021 11:29 AM   [facility] pharmacy phone numbers are listed on [email or web address]

## 2021-02-05 DIAGNOSIS — I10 Essential (primary) hypertension: Secondary | ICD-10-CM | POA: Diagnosis not present

## 2021-02-05 DIAGNOSIS — Z955 Presence of coronary angioplasty implant and graft: Secondary | ICD-10-CM | POA: Diagnosis not present

## 2021-02-05 DIAGNOSIS — E669 Obesity, unspecified: Secondary | ICD-10-CM | POA: Diagnosis not present

## 2021-02-05 DIAGNOSIS — J439 Emphysema, unspecified: Secondary | ICD-10-CM | POA: Diagnosis not present

## 2021-02-05 DIAGNOSIS — Z6831 Body mass index (BMI) 31.0-31.9, adult: Secondary | ICD-10-CM | POA: Diagnosis not present

## 2021-02-05 DIAGNOSIS — N179 Acute kidney failure, unspecified: Secondary | ICD-10-CM | POA: Diagnosis not present

## 2021-02-05 DIAGNOSIS — I110 Hypertensive heart disease with heart failure: Secondary | ICD-10-CM | POA: Diagnosis not present

## 2021-02-05 DIAGNOSIS — T82855A Stenosis of coronary artery stent, initial encounter: Secondary | ICD-10-CM | POA: Diagnosis not present

## 2021-02-05 DIAGNOSIS — E119 Type 2 diabetes mellitus without complications: Secondary | ICD-10-CM | POA: Diagnosis not present

## 2021-02-05 DIAGNOSIS — I959 Hypotension, unspecified: Secondary | ICD-10-CM | POA: Diagnosis not present

## 2021-02-05 DIAGNOSIS — Z20822 Contact with and (suspected) exposure to covid-19: Secondary | ICD-10-CM | POA: Diagnosis not present

## 2021-02-05 DIAGNOSIS — I214 Non-ST elevation (NSTEMI) myocardial infarction: Secondary | ICD-10-CM | POA: Diagnosis not present

## 2021-02-05 DIAGNOSIS — I5042 Chronic combined systolic (congestive) and diastolic (congestive) heart failure: Secondary | ICD-10-CM | POA: Diagnosis not present

## 2021-02-05 DIAGNOSIS — R778 Other specified abnormalities of plasma proteins: Secondary | ICD-10-CM | POA: Diagnosis not present

## 2021-02-05 DIAGNOSIS — I2510 Atherosclerotic heart disease of native coronary artery without angina pectoris: Secondary | ICD-10-CM | POA: Diagnosis not present

## 2021-02-05 LAB — BASIC METABOLIC PANEL
Anion gap: 10 (ref 5–15)
BUN: 25 mg/dL — ABNORMAL HIGH (ref 8–23)
CO2: 26 mmol/L (ref 22–32)
Calcium: 8.9 mg/dL (ref 8.9–10.3)
Chloride: 100 mmol/L (ref 98–111)
Creatinine, Ser: 1.36 mg/dL — ABNORMAL HIGH (ref 0.61–1.24)
GFR, Estimated: 58 mL/min — ABNORMAL LOW (ref >60)
Glucose, Bld: 120 mg/dL — ABNORMAL HIGH (ref 70–99)
Potassium: 3.7 mmol/L (ref 3.5–5.1)
Sodium: 136 mmol/L (ref 135–145)

## 2021-02-05 LAB — CBC
HCT: 43.8 % (ref 39.0–52.0)
Hemoglobin: 14.9 g/dL (ref 13.0–17.0)
MCH: 30.2 pg (ref 26.0–34.0)
MCHC: 34 g/dL (ref 30.0–36.0)
MCV: 88.8 fL (ref 80.0–100.0)
Platelets: 304 10*3/uL (ref 150–400)
RBC: 4.93 MIL/uL (ref 4.22–5.81)
RDW: 13.9 % (ref 11.5–15.5)
WBC: 10.9 10*3/uL — ABNORMAL HIGH (ref 4.0–10.5)
nRBC: 0 % (ref 0.0–0.2)

## 2021-02-05 NOTE — Progress Notes (Signed)
CARDIAC REHAB PHASE I   PRE:  Rate/Rhythm: 81 SR    BP: sitting 118/87    SaO2: 97 RA  MODE:  Ambulation: 470 ft   POST:  Rate/Rhythm: 95 SR    BP: sitting 132/90     SaO2: 94 RA  Tolerated well, feels good. Discussed MI, Brilinta importance, restrictions, diet, weighing daily, exercise, NTG, and CRPII. Pt receptive. He makes comment that he gained 20 lbs recently and was feeling bloated. Now down 7 lbs per pt. Encouraged continuing daily wts and calling MD for quick weight gain. He also comments that his PCP stopped his lipitor recently. LDL 150. Discussed imperative need for Brilinta and lipitor. Will refer to [place], ACSM 02/05/2021 8:32 AM

## 2021-02-05 NOTE — Progress Notes (Addendum)
Progress Note  Patient Name: Stephen Jennings Date of Encounter: 02/05/2021  [facility] Cardiologist: Shelva Majestic, MD   Subjective   Doing well this morning. No chest pain. Walked in the hallways.   Inpatient Medications    Scheduled Meds:  aspirin  81 mg Oral Daily   empagliflozin  10 mg Oral QHS   furosemide  40 mg Oral Daily   metoprolol succinate  12.5 mg Oral Daily   pantoprazole  40 mg Oral Daily   potassium chloride SA  20 mEq Oral Daily   rosuvastatin  40 mg Oral Daily   sodium chloride flush  3 mL Intravenous Q12H   sodium chloride flush  3 mL Intravenous Q12H   ticagrelor  90 mg Oral BID   traZODone  25 mg Oral QHS   Continuous Infusions:  sodium chloride     sodium chloride     sodium chloride 100 mL/hr at 02/05/21 0445   sodium chloride     nitroGLYCERIN Stopped (02/04/21 1900)   tirofiban 0.15 mcg/kg/min (02/05/21 0441)   PRN Meds: sodium chloride, sodium chloride, acetaminophen, albuterol, diazepam, fluticasone, nitroGLYCERIN, ondansetron (ZOFRAN) IV, polyvinyl alcohol, sodium chloride flush, sodium chloride flush   Vital Signs    Vitals:   02/04/21 1416 02/04/21 1915 02/05/21 0450 02/05/21 0937  BP: (!) 131/94 110/88 123/80 103/84  Pulse: 91 84 88 88  Resp: 16 17 (!) 25   Temp: 97.9 F (36.6 C) 97.9 F (36.6 C) 98.3 F (36.8 C)   TempSrc: Oral Oral Oral   SpO2: 96% 94% 90%   Weight:   87.5 kg   Height:        Intake/Output Summary (Last 24 hours) at 02/05/2021 1007 Last data filed at 02/05/2021 0937 Gross per 24 hour  Intake 3184.3 ml  Output 2400 ml  Net 784.3 ml   Last 3 Weights 02/05/2021 02/04/2021 02/03/2021  Weight (lbs) 193 lb 191 lb 11.2 oz 194 lb 14.4 oz  Weight (kg) 87.544 kg 86.955 kg 88.406 kg      Telemetry    SR - Personally Reviewed  ECG    SR with slight ST depression in lateral leads, continue ST elevation in V5-v6 - Personally Reviewed  Physical Exam   GEN: No acute distress.   Neck: No JVD Cardiac: RRR, no  murmurs, rubs, or gallops.  Respiratory: Bilateral expiratory wheezing GI: Soft, nontender, non-distended  MS: No edema; No deformity. Neuro:  Nonfocal  Psych: Normal affect   Labs    High Sensitivity Troponin:   Recent Labs  Lab 02/01/21 1627 02/01/21 1827  TROPONINIHS 559* 3,429*      Chemistry Recent Labs  Lab 02/02/21 0552 02/03/21 0958 02/05/21 0038  NA 134* 135 136  K 4.4 4.2 3.7  CL 100 103 100  CO2 24 24 26   GLUCOSE 127* 144* 120*  BUN 19 20 25*  CREATININE 1.04 1.15 1.36*  CALCIUM 9.2 9.1 8.9  GFRNONAA >60 >60 58*  ANIONGAP 10 8 10      Hematology Recent Labs  Lab 02/03/21 0118 02/04/21 0437 02/05/21 0038  WBC 10.1 9.7 10.9*  RBC 4.91 5.39 4.93  HGB 14.5 15.9 14.9  HCT 44.7 48.3 43.8  MCV 91.0 89.6 88.8  MCH 29.5 29.5 30.2  MCHC 32.4 32.9 34.0  RDW 14.7 14.2 13.9  PLT 235 295 304    BNPNo results for input(s): BNP, PROBNP in the last 168 hours.   DDimer No results for input(s): DDIMER in the last 168 hours.  Radiology    CARDIAC CATHETERIZATION  Result Date: 02/04/2021  Ost LAD to Prox LAD lesion is 20% stenosed.  Prox LAD to Mid LAD lesion is 40% stenosed.  Dist LAD-1 lesion is 40% stenosed.  Dist LAD-2 lesion is 80% stenosed.  Prox RCA lesion is 20% stenosed.  Post intervention, there is a 0% residual stenosis.  1st Diag lesion is 5% stenosed.  Relook angiography of the left coronary system showed improvement in the previous thrombus burden in the diagonal and LAD distal stent and beyond but moderate residual thrombus was still present. LVEDP 8 mmHg Successful percutaneous coronary intervention of the LAD utilizing Pronto thrombectomy, intravascular ultrasound and ultimate PTCA of the distal stented segment and mid LAD with residual narrowing 0%.  Resolution of thrombus in the diagonal vessel with mild 5% narrowing.  Very mild thrombus distal stent in the region of the septal perforating artery.  There is brisk TIMI-3 flow. RECOMMENDATION:  DAPT indefinitely.  Would continue Aggrastat for additional 24 to 36 hours.  GDMT Medical therapy for his LV function/ischemic cardiomyopathy.    Cardiac Studies   Cath: 02/02/21  Prox RCA lesion is 20% stenosed. Prox LAD to Mid LAD lesion is 15% stenosed. 1st Diag lesion is 5% stenosed. Ost LAD to Prox LAD lesion is 20% stenosed. Dist LAD-1 lesion is 40% stenosed. Dist LAD-2 lesion is 80% stenosed. There is severe left ventricular systolic dysfunction. LV end diastolic pressure is severely elevated. The left ventricular ejection fraction is 25-35% by visual estimate.   Patent proximal LAD stent with mild 20% in-stent narrowing.  There is significant thrombus burden immediately after the stent extending into the diagonal vessel and mid LAD with TIMI-3 flow.  There is a 305 mid distal LAD stenosis and  distal 80% stenosis in a small caliber distal LAD.   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  The dominant RCA had improvement in the previous 70% stenosis which now appeared approximately 20%.  The RCA supplied the PDA and PLA vessel   Acute LV dysfunction with EF estimated at 25 to 30% with severe hypocontractility involving the mid distal anterolateral wall apex and apical inferior segment.  Elevated LVEDP at 29 mmHg.   RECOMMENDATION: Patient was started on Aggrastat in the catheterization laboratory and was bolused with Brilinta 180 mg orally.  He does not appear to have significant stenoses at the site of his thrombotic diagonal and LAD and plan continuation of Aggrastat for approximately 48 hours with restudy.  Of note, the patient had recently stopped his ticagrelor and a echo on December 14, 2020 had shown normalization of LV function without wall motion abnormalities.   Diagnostic Dominance: Right   Echo: 02/02/21  IMPRESSIONS     1. Akinesis of the anteroseptal wall and apex with overall  moderate LV  dysfunction.   2. Left ventricular ejection fraction, by estimation, is 35 to 40%. The  left ventricle has moderately decreased function. The left ventricle  demonstrates regional wall motion abnormalities (see scoring  diagram/findings for description). Left ventricular   diastolic parameters are consistent with Grade II diastolic dysfunction  (pseudonormalization).   3. Right ventricular systolic function is normal. The right ventricular  size is normal.   4. The mitral valve is normal in structure. Mild mitral valve  regurgitation. No evidence of mitral stenosis.   5. The aortic valve was not well visualized. Aortic valve regurgitation  is not visualized. No aortic stenosis  is present.   6. The inferior vena cava is normal in size with greater than 50%  respiratory variability, suggesting right atrial pressure of 3 mmHg.   Cath: 02/04/21  Ost LAD to Prox LAD lesion is 20% stenosed. Prox LAD to Mid LAD lesion is 40% stenosed. Dist LAD-1 lesion is 40% stenosed. Dist LAD-2 lesion is 80% stenosed. Prox RCA lesion is 20% stenosed. Post intervention, there is a 0% residual stenosis. 1st Diag lesion is 5% stenosed.   Relook angiography of the left coronary system showed improvement in the previous thrombus burden in the diagonal and LAD distal stent and beyond but moderate residual thrombus was still present.   LVEDP 8 mmHg   Successful percutaneous coronary intervention of the LAD utilizing Pronto thrombectomy, intravascular ultrasound and ultimate PTCA of the distal stented segment and mid LAD with residual narrowing 0%.  Resolution of thrombus in the diagonal vessel with mild 5% narrowing.  Very mild thrombus distal stent in the region of the septal perforating artery.  There is brisk TIMI-3 flow.   RECOMMENDATION: DAPT indefinitely.  Would continue Aggrastat for additional 24 to 36 hours.  GDMT Medical therapy for his LV function/ischemic cardiomyopathy.     Diagnostic Dominance: Right    Intervention     Patient Profile     65 y.o. male with a history of CAD with STEMI in 03/2019 s/p PCI with LAD complicated by cardiogenic shock, VT/Torsades cardiac arrest, s/p placement of St. Jude ICD in 03/2019 for secondary prevention, chronic combined CHF, COPD, hyperlipidemia, and diabetes mellitus who was admitted with NSTEMI after presenting with recurrent chest pain.  Assessment & Plan    NSTEMI: High-sensitivity troponin peaked at 3429.  Underwent cardiac cath 6/14 which showed patent proximal LAD stent with significant thrombus burden immediately after stent extending into the diagonal vessel and mid LAD.  Also 80% stenosis of small caliber distal LAD.  Patient was given Brilinta and started on Aggrastat for 48 hours. --Underwent relook cath 6/16 showing improvement in the previous thrombus burden in the diagonal and dLAD stent.  We will PCI with IVUS and balloon angioplasty to the distal stented segment of LAD and mid LAD with residual narrowing of 0%. --Plan for total of 36 hours of Aggrastat post cath, along with DAPT indefinitely with aspirin/Brilinta --Continue aspirin, Brilinta, Crestor 40 mg daily, metoprolol 12.5 mg daily.  On Entresto 49-51 twice daily PTA which was held for cath.  Anticipate resumption tomorrow pending stable creatinine.   Chronic Combined CHF: Echo showed LVEF of 35-40% with grade 2 diastolic dysfunction. -- Home Entresto 49-51mg  twice daily and Eplerenone 25mg  daily on hold for cardiac catheterization. Cr 1.36 today, would plan to resume tomorrow of renal function stable.  -- Continue Toprol-XL 12.5mg  daily. -- Continue Jardiance 10mg  daily.   S/p ICD: S/p St Jude ICD in 03/2019 for secondary prevention after VT/Torsades/cardiac arrest in 03/2019. -- Followed by Dr. Curt Bears.   COPD: Slight wheezing on exam -- continue nebs -- he voices concern over Brilinta causing some shortness of breath, will continue to monitor  symptoms for now. Cath notes indicate consideration of Effient over plavix if unable to tolerate.   Hypertension:BP well controlled. -- Continue medications for CHF as above.   Hyperlipidemia: Lipid panel this admission: Total Cholesterol 221, Triglycerides 89, HDL 53, LDL 150. -- LDL goal <70 given CAD. -- Continue Crestor 40mg  daily, along with Zetia 10mg  daily. -- Recommend referral to lipid clinic for consideration of PCSK9 inhibitor.   Type  2 Diabetes Mellitus: Hemoglobin A1c 6.4. -- Continue Jardiance.   For questions or updates, please contact [place] Please consult [email or web address] for contact info under        Signed, Reino Bellis, NP  02/05/2021, 10:07 AM    Agree with note by Reino Bellis NP-C  Mr. Criado had repeat cardiac catheterization yesterday by Dr. Claiborne Billings revealing significant improvement in his thrombus burden as result of 48 hours of Aggrastat infusion.  There was some residual thrombus.  He underwent aspiration thrombectomy followed by IVUS and POBA  with a 3 mm balloon.  He was placed back on Aggrastat for 36 hours.  He has been pain-free.  Plan is to watch him for 48 hours after Aggrastat is discontinued after which she can be discharged home on aspirin and Brilinta which she will need to continue indefinitely.  His exam was benign.  Lorretta Harp, M.D., [place], [facility], [place] [address] [place], [place]  [address]  [phone] 02/05/2021 11:53 AM

## 2021-02-05 NOTE — Progress Notes (Signed)
ANTICOAGULATION CONSULT NOTE - Follow Up Consult  Pharmacy Consult for aggrastat Indication:  NSTEMI  Labs: Recent Labs    02/03/21 0118 02/03/21 0958 02/04/21 0437 02/05/21 0038  HGB 14.5  --  15.9 14.9  HCT 44.7  --  48.3 43.8  PLT 235  --  295 304  CREATININE  --  1.15  --  1.36*     Assessment: 65yo male admitted as NSTEMI. Pt recently completed ticagrelor course outpatient. LHC today revealed thrombus burden, pharmacy asked to dose tirofiban x48h with plans for relook cath.   CBC/platelet count stable  Now s/p re-cath with thrombectomy to LAD with PTCA.  Planning aggrastat x 36 additional hours, DAPT indefinitely.   Plan:  Tirofiban 0.15 mcg/kg/min; will end tonight at 10 PM. Watch CBC  [place] Crane, Vena Austria, BCPS, [facility] Clinical Pharmacist  02/05/2021 10:15 AM   [facility] pharmacy phone numbers are listed on [email or web address]

## 2021-02-05 NOTE — Plan of Care (Signed)
  Problem: Cardiovascular: Goal: Ability to achieve and maintain adequate cardiovascular perfusion will improve Outcome: Progressing Goal: Vascular access site(s) Level 0-1 will be maintained Outcome: Progressing   

## 2021-02-06 DIAGNOSIS — I214 Non-ST elevation (NSTEMI) myocardial infarction: Secondary | ICD-10-CM | POA: Diagnosis not present

## 2021-02-06 DIAGNOSIS — E785 Hyperlipidemia, unspecified: Secondary | ICD-10-CM | POA: Insufficient documentation

## 2021-02-06 LAB — CBC
HCT: 47 % (ref 39.0–52.0)
Hemoglobin: 15.4 g/dL (ref 13.0–17.0)
MCH: 29.2 pg (ref 26.0–34.0)
MCHC: 32.8 g/dL (ref 30.0–36.0)
MCV: 89.2 fL (ref 80.0–100.0)
Platelets: 329 10*3/uL (ref 150–400)
RBC: 5.27 MIL/uL (ref 4.22–5.81)
RDW: 13.8 % (ref 11.5–15.5)
WBC: 10.7 10*3/uL — ABNORMAL HIGH (ref 4.0–10.5)
nRBC: 0 % (ref 0.0–0.2)

## 2021-02-06 LAB — BASIC METABOLIC PANEL
Anion gap: 10 (ref 5–15)
BUN: 26 mg/dL — ABNORMAL HIGH (ref 8–23)
CO2: 25 mmol/L (ref 22–32)
Calcium: 9.5 mg/dL (ref 8.9–10.3)
Chloride: 101 mmol/L (ref 98–111)
Creatinine, Ser: 1.25 mg/dL — ABNORMAL HIGH (ref 0.61–1.24)
GFR, Estimated: >60 mL/min (ref >60)
Glucose, Bld: 125 mg/dL — ABNORMAL HIGH (ref 70–99)
Potassium: 4 mmol/L (ref 3.5–5.1)
Sodium: 136 mmol/L (ref 135–145)

## 2021-02-06 MED ORDER — ASPIRIN EC 81 MG PO TBEC: 81.0000 mg | DELAYED_RELEASE_TABLET | Freq: Every day | ORAL | 2 refills | Status: AC

## 2021-02-06 MED ORDER — METOPROLOL SUCCINATE ER 25 MG PO TB24: 12.5000 mg | ORAL_TABLET | Freq: Every day | ORAL | 3 refills | Status: DC

## 2021-02-06 MED ORDER — EZETIMIBE 10 MG PO TABS: 10.0000 mg | ORAL_TABLET | Freq: Every day | ORAL | 3 refills | Status: DC

## 2021-02-06 MED ORDER — FUROSEMIDE 40 MG PO TABS: 40.0000 mg | ORAL_TABLET | Freq: Every day | ORAL | 3 refills | Status: DC

## 2021-02-06 MED ORDER — EZETIMIBE 10 MG PO TABS
10.0000 mg | ORAL_TABLET | Freq: Every day | ORAL | Status: DC
  Administered 2021-02-06: 10 mg via ORAL
  Filled 2021-02-06: qty 1

## 2021-02-06 MED ORDER — TICAGRELOR 90 MG PO TABS: 90.0000 mg | ORAL_TABLET | Freq: Two times a day (BID) | ORAL | 3 refills | Status: DC

## 2021-02-06 MED ORDER — LOSARTAN POTASSIUM 25 MG PO TABS: 12.5000 mg | ORAL_TABLET | Freq: Every day | ORAL | 3 refills | Status: DC

## 2021-02-06 MED ORDER — NITROGLYCERIN 0.4 MG SL SUBL: 0.4000 mg | SUBLINGUAL_TABLET | SUBLINGUAL | 3 refills | Status: AC | PRN

## 2021-02-06 MED ORDER — POTASSIUM CHLORIDE CRYS ER 20 MEQ PO TBCR: 20.0000 meq | EXTENDED_RELEASE_TABLET | Freq: Every day | ORAL | 3 refills | Status: DC

## 2021-02-06 MED ORDER — LOSARTAN POTASSIUM 25 MG PO TABS
12.5000 mg | ORAL_TABLET | Freq: Every day | ORAL | Status: DC
  Administered 2021-02-06: 12.5 mg via ORAL
  Filled 2021-02-06: qty 1

## 2021-02-06 NOTE — Discharge Summary (Signed)
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 total) by mouth daily. What changed:  when to take this reasons to take this   Jardiance 10 MG Tabs tablet Generic drug: empagliflozin Take 10 mg by mouth daily before breakfast.   loratadine 10 MG tablet Commonly known as: CLARITIN Take 1 tablet (10 mg total) by mouth daily.   losartan 25 MG tablet Commonly known as: COZAAR Take 0.5 tablets (12.5 mg total) by mouth daily.   metoprolol succinate 25 MG 24 hr tablet Commonly known as: TOPROL-XL Take 0.5 tablets (12.5 mg total) by mouth daily.   montelukast 10 MG tablet Commonly known as: SINGULAIR Take 1 tablet (10 mg total) by mouth at bedtime.   nitroGLYCERIN 0.4 MG SL tablet Commonly known as: NITROSTAT Place 1 tablet (0.4 mg total) under the tongue every 5 (five) minutes x 3 doses as needed for chest pain.   omeprazole 20 MG capsule Commonly known as: PRILOSEC Take 1 capsule (20 mg total) by mouth daily.   potassium chloride SA 20 MEQ  tablet Commonly known as: KLOR-CON Take 1 tablet (20 mEq total) by mouth daily. What changed:  when to take this reasons to take this   rosuvastatin 40 MG tablet Commonly known as: CRESTOR Take 1 tablet (40 mg total) by mouth daily.   Spiriva HandiHaler 18 MCG inhalation capsule Generic drug: tiotropium Place 1 capsule (18 mcg total) into inhaler and inhale daily.   ticagrelor 90 MG Tabs tablet Commonly known as: BRILINTA Take 1 tablet (90 mg total) by mouth 2 (two) times daily.       ASK your doctor about these medications    guaiFENesin 600 MG 12 hr tablet Commonly known as: Mucinex Take 1 tablet (600 mg total) by mouth 2 (two) times daily.   traZODone 50 MG tablet Commonly known as: DESYREL TAKE 1/2-1 TABLET BY MOUTH AT BEDTIME  Outstanding Labs/Studies   Add back entresto and eplerenone   Needs lipid clinic  Duration of Discharge Encounter   Greater than 30 minutes including physician time.  Signed, Tami Lin Cotina Freedman, PA 02/06/2021, 11:53 AM
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
## 2021-02-08 ENCOUNTER — Telehealth: Payer: Self-pay

## 2021-02-08 NOTE — Telephone Encounter (Signed)
Transition Care Management Follow-up Telephone Call Date of discharge and from where: 02/06/2021, [facility]  How have you been since you were released from the hospital? He said it's a slow go; but he is doing all right Any questions or concerns? Yes - he is very upset with the long wait that he had in the ED when they knew that he had a heart attack.   Items Reviewed: Did the pt receive and understand the discharge instructions provided? Yes  Medications obtained and verified? Yes  - he said that he has all medications , including the new ones, and did not have any questions about the med regime.  Other? No  Any new allergies since your discharge? No  Do you have support at home? Yes , his wife  [place] and Equipment/Supplies: Were home health services ordered? no If so, what is the name of the agency? N/a  Has the agency set up a time to come to the patient's home? not applicable Were any new equipment or medical supplies ordered?  No What is the name of the medical supply agency? N/a Were you able to get the supplies/equipment? not applicable Do you have any questions related to the use of the equipment or supplies? No  He already has a glucometer and nebulizer.   Functional Questionnaire: (I = Independent and D = Dependent) ADLs: independent   Follow up appointments reviewed:  PCP Hospital f/u appt confirmed?  He said he will call to schedule an appointment because he already has quite a few appointments scheduled   [facility] f/u appt confirmed? Yes  Scheduled to see pulmonary - 02/11/2021; CHF - 03/23/2021; cardiology- 03/24/2021.  Are transportation arrangements needed? No  If their condition worsens, is the pt aware to call PCP or go to the Emergency Dept.? Yes Was the patient provided with contact information for the PCP's office or ED? Yes Was to pt encouraged to call back with questions or concerns? Yes

## 2021-02-10 ENCOUNTER — Ambulatory Visit (INDEPENDENT_AMBULATORY_CARE_PROVIDER_SITE_OTHER): Payer: Medicaid Other | Admitting: Pharmacist

## 2021-02-10 ENCOUNTER — Telehealth (HOSPITAL_COMMUNITY): Payer: Self-pay

## 2021-02-10 ENCOUNTER — Other Ambulatory Visit (HOSPITAL_COMMUNITY): Payer: Self-pay

## 2021-02-10 ENCOUNTER — Other Ambulatory Visit: Payer: Self-pay

## 2021-02-10 DIAGNOSIS — I2510 Atherosclerotic heart disease of native coronary artery without angina pectoris: Secondary | ICD-10-CM | POA: Diagnosis not present

## 2021-02-10 MED ORDER — ROSUVASTATIN CALCIUM 20 MG PO TABS: 20.0000 mg | ORAL_TABLET | Freq: Every day | ORAL | 3 refills | Status: DC

## 2021-02-10 MED ORDER — EMPAGLIFLOZIN 10 MG PO TABS: 10.0000 mg | ORAL_TABLET | Freq: Every day | ORAL | 3 refills | Status: DC

## 2021-02-10 NOTE — Telephone Encounter (Signed)
Pt insurance is active and benefits verified through [facility]-pay $3, DED 0/0 met, out of pocket 0/0 met, co-insurance 0%. no pre-authorization required. [place]/BCBS 02/10/2021@3 :45pm, REF# [id number]   Will contact patient to see if he` is interested in the Cardiac Rehab Program. If interested, patient will need to complete follow up appt. Once completed, patient will be contacted for scheduling upon review by the RN Navigator.

## 2021-02-10 NOTE — Patient Instructions (Addendum)
Your LDL cholesterol goal is < 55  -Start rosuvastatin (Crestor) 20mg  - 1 tablet once daily -Continue ezetimibe 10mg  - 1 tablet once daily  For your blood pressure/heart failure: -Increase your losartan to 1 tablet (25mg ) once daily -Increase your metoprolol to 1 tablet (25mg ) once daily -Continue taking Jardiance 1 tablet once daily, I sent in a refill of this  -Hold off on the Entresto and eplerenone for now, we will hopefully restart you on these medications at your future visits  Keep an eye on your blood pressure and weight at home. Please bring in your blood pressure readings and cuff to your next visit in 2 weeks
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
## 2021-02-11 ENCOUNTER — Encounter: Payer: Self-pay | Admitting: Pulmonary Disease

## 2021-02-11 ENCOUNTER — Ambulatory Visit: Payer: Medicaid Other | Admitting: Pulmonary Disease

## 2021-02-11 VITALS — BP 112/74 | HR 79 | Ht 68.0 in | Wt 205.0 lb

## 2021-02-11 DIAGNOSIS — I5022 Chronic systolic (congestive) heart failure: Secondary | ICD-10-CM | POA: Diagnosis not present

## 2021-02-11 DIAGNOSIS — R0602 Shortness of breath: Secondary | ICD-10-CM

## 2021-02-11 NOTE — Patient Instructions (Signed)
Nice to meet you  Since your breathing is totally back to normal, I see no need for new medicines or tests.

## 2021-02-11 NOTE — Progress Notes (Signed)
Remote ICD transmission.   

## 2021-02-11 NOTE — Progress Notes (Signed)
@Patient  ID: Stephen Jennings, male    DOB: 08/30/1955, 65 y.o.   MRN: [id number]  Chief Complaint  Patient presents with   Consult    Referred by cardiologist for possible emphysema. States he had a bad cold a few weeks but feels much better. Has not noticed any breathing issues since.     Referring provider: Kennieth Rad, [name]  HPI:   65 year old whom we are seeing in consultation for evaluation of dyspnea on exertion.  Multiple notes from recent hospitalization reviewed, H&P, discharge summary.  Patient notes several weeks ago he felt ill.  Cough, sore throat, runny nose.  Felt like his virus.  Felt more rundown, short of breath during that time.  Gradually symptoms seem to slowly improve.  However had residual dyspnea on exertion.  Worse with inclines or stairs.  Better with rest.  No environmental or seasonal changes he can identify to contribute.  Did not really try much to make things better or worse.  Cannot identify clear exacerbating or alleviating factors.  Also/13, he developed chest pain with radiation down left arm while doing yard work.  Presented to the ED. Troponins elevated. Medical therapy for NSTEMI started. LHC 6/14 with narrowing in prior stent in LAD. Placed on aggrastat and bolused brilinta. Repeat LHC 6/16 with improved narrowing/thrombus in prior stent. TTE with new reduced EF compared to 10/2020. Diuresed, and started on lasix daily. He states this has helped immensely with DOE. Denies any respiratory issues. Denies any limitations due to dyspnea.  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  Working without issue.  CXR 02/01/21 on my review and interpretation with hyperinflation on lateral view, bronchitic changes bilaterally.  PMH: tobacco abuse in remission, CAD s/p DES Surgical history: cardiac cath Family History: CHF, HTN in mom Social history: former smoker, quit 2020 at time of first heart attack, 40 pack year history, lives in  [employer] / Pulmonary Flowsheets:   ACT:  No flowsheet data found.  MMRC: No flowsheet data found.  Epworth:  No flowsheet data found.  Tests:   FENO:  No results found for: NITRICOXIDE  PFT: No flowsheet data found.  WALK:  No flowsheet data found.  Imaging: DG Chest 2 View  Result Date: 02/01/2021 CLINICAL DATA:  Chest pain. EXAM: CHEST - 2 VIEW COMPARISON:  Multiple priors, most recent December 01, 2020. FINDINGS: Emphysematous change and chronic peribronchial thickening. No new consolidation. No visible pleural effusions or pneumothorax. Biapical pleuroparenchymal scarring. The similar cardiomediastinal silhouette. Left dual lead subclavian approach cardiac rhythm maintenance device. IMPRESSION: 1. No evidence of acute cardiopulmonary disease. 2. Emphysema. Electronically Signed   By: Stephen Sheffield MD   On: 02/01/2021 16:53   CARDIAC CATHETERIZATION  Result Date: 02/04/2021  Ost LAD to Prox LAD lesion is 20% stenosed.  Prox LAD to Mid LAD lesion is 40% stenosed.  Dist LAD-1 lesion is 40% stenosed.  Dist LAD-2 lesion is 80% stenosed.  Prox RCA lesion is 20% stenosed.  Post intervention, there is a 0% residual stenosis.  1st Diag lesion is 5% stenosed.  Relook angiography of the left coronary system showed improvement in the previous thrombus burden in the diagonal and LAD distal stent and beyond but moderate residual thrombus was still present. LVEDP 8 mmHg Successful percutaneous coronary intervention of the LAD utilizing Pronto thrombectomy, intravascular ultrasound and ultimate PTCA of the distal stented segment and mid LAD with residual narrowing 0%.  Resolution of thrombus in  the diagonal vessel with mild 5% narrowing.  Very mild thrombus distal stent in the region of the septal perforating artery.  There is brisk TIMI-3 flow. RECOMMENDATION: DAPT indefinitely.  Would continue Aggrastat for additional 24 to 36 hours.  GDMT Medical therapy for his LV  function/ischemic cardiomyopathy.   CARDIAC CATHETERIZATION  Result Date: 02/02/2021  Prox RCA lesion is 20% stenosed.  Prox LAD to Mid LAD lesion is 15% stenosed.  1st Diag lesion is 5% stenosed.  Ost LAD to Prox LAD lesion is 20% stenosed.  Dist LAD-1 lesion is 40% stenosed.  Dist LAD-2 lesion is 80% stenosed.  There is severe left ventricular systolic dysfunction.  LV end diastolic pressure is severely elevated.  The left ventricular ejection fraction is 25-35% by visual estimate.  Patent proximal LAD stent with mild 20% in-stent narrowing.  There is significant thrombus burden immediately after the stent extending into the diagonal vessel and mid LAD with TIMI-3 flow.  There is a 305 mid distal LAD stenosis and  distal 80% stenosis in a small caliber distal LAD. 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  The dominant RCA had improvement in the previous 70% stenosis which now appeared approximately 20%.  The RCA supplied the PDA and PLA vessel Acute LV dysfunction with EF estimated at 25 to 30% with severe hypocontractility involving the mid distal anterolateral wall apex and apical inferior segment.  Elevated LVEDP at 29 mmHg. RECOMMENDATION: Patient was started on Aggrastat in the catheterization laboratory and was bolused with Brilinta 180 mg orally.  He does not appear to have significant stenoses at the site of his thrombotic diagonal and LAD and plan continuation of Aggrastat for approximately 48 hours with restudy.  Of note, the patient had recently stopped his ticagrelor and a echo on December 14, 2020 had shown normalization of LV function without wall motion abnormalities.   ECHOCARDIOGRAM COMPLETE  Result Date: 02/02/2021    ECHOCARDIOGRAM REPORT   Patient Name:   Stephen Jennings Date of Exam: 02/02/2021 Medical Rec #:  [id number]     Height:       68.0 in Accession #:    [id number]    Weight:       200.7 lb  Date of Birth:  08/17/1956     BSA:          2.047 m Patient Age:    65 years      BP:           101/68 mmHg Patient Gender: M             HR:           81 bpm. Exam Location:  Inpatient Procedure: 2D Echo, Cardiac Doppler and Color Doppler Indications:    NSTEMI  History:        Patient has prior history of Echocardiogram examinations, most                 recent 12/14/2020. Pacemaker; Risk Factors:Hypertension, Current                 Smoker and Diabetes.  Sonographer:    Cammy Brochure Referring Phys: [id number] Lavar Rosenzweig A CARLISLE  Sonographer Comments: Image acquisition challenging due to patient body habitus. IMPRESSIONS  1. Akinesis of the anteroseptal wall and apex with overall moderate LV dysfunction.  2. Left ventricular ejection fraction, by estimation, is 35 to 40%. The left ventricle has moderately decreased function.  The left ventricle demonstrates regional wall motion abnormalities (see scoring diagram/findings for description). Left ventricular  diastolic parameters are consistent with Grade II diastolic dysfunction (pseudonormalization).  3. Right ventricular systolic function is normal. The right ventricular size is normal.  4. The mitral valve is normal in structure. Mild mitral valve regurgitation. No evidence of mitral stenosis.  5. The aortic valve was not well visualized. Aortic valve regurgitation is not visualized. No aortic stenosis is present.  6. The inferior vena cava is normal in size with greater than 50% respiratory variability, suggesting right atrial pressure of 3 mmHg. FINDINGS  Left Ventricle: Left ventricular ejection fraction, by estimation, is 35 to 40%. The left ventricle has moderately decreased function. The left ventricle demonstrates regional wall motion abnormalities. Definity contrast agent was given IV to delineate the left ventricular endocardial borders. The left ventricular internal cavity size was normal in size. There is no left ventricular hypertrophy. Left  ventricular diastolic parameters are consistent with Grade II diastolic dysfunction (pseudonormalization). Right Ventricle: The right ventricular size is normal.Right ventricular systolic function is normal. Left Atrium: Left atrial size was normal in size. Right Atrium: Right atrial size was normal in size. Pericardium: There is no evidence of pericardial effusion. Mitral Valve: The mitral valve is normal in structure. Mild mitral annular calcification. Mild mitral valve regurgitation. No evidence of mitral valve stenosis. Tricuspid Valve: The tricuspid valve is normal in structure. Tricuspid valve regurgitation is trivial. No evidence of tricuspid stenosis. Aortic Valve: The aortic valve was not well visualized. Aortic valve regurgitation is not visualized. No aortic stenosis is present. Aortic valve mean gradient measures 3.0 mmHg. Aortic valve peak gradient measures 5.9 mmHg. Aortic valve area, by VTI measures 2.48 cm. Pulmonic Valve: The pulmonic valve was not well visualized. Pulmonic valve regurgitation is not visualized. No evidence of pulmonic stenosis. Aorta: The aortic root is normal in size and structure. Venous: The inferior vena cava is normal in size with greater than 50% respiratory variability, suggesting right atrial pressure of 3 mmHg. IAS/Shunts: No atrial level shunt detected by color flow Doppler. Additional Comments: Akinesis of the anteroseptal wall and apex with overall moderate LV dysfunction. A device lead is visualized.  LEFT VENTRICLE PLAX 2D LVIDd:         5.30 cm  Diastology LVIDs:         3.80 cm  LV e' medial:    7.07 cm/s LV PW:         1.10 cm  LV E/e' medial:  10.6 LV IVS:        1.10 cm  LV e' lateral:   8.05 cm/s LVOT diam:     1.90 cm  LV E/e' lateral: 9.3 LV SV:         48 LV SV Index:   23 LVOT Area:     2.84 cm  RIGHT VENTRICLE             IVC RV S prime:     12.00 cm/s  IVC diam: 1.10 cm TAPSE (M-mode): 2.0 cm LEFT ATRIUM             Index       RIGHT ATRIUM            Index LA diam:        3.70 cm 1.81 cm/m  RA Area:     13.80 cm LA Vol (A2C):   57.3 ml 28.00 ml/m RA Volume:   33.10 ml  16.17 ml/m LA Vol (  A4C):   46.8 ml 22.87 ml/m LA Biplane Vol: 55.5 ml 27.12 ml/m  AORTIC VALVE AV Area (Vmax):    2.41 cm AV Area (Vmean):   2.42 cm AV Area (VTI):     2.48 cm AV Vmax:           121.00 cm/s AV Vmean:          76.700 cm/s AV VTI:            0.193 m AV Peak Grad:      5.9 mmHg AV Mean Grad:      3.0 mmHg LVOT Vmax:         103.00 cm/s LVOT Vmean:        65.500 cm/s LVOT VTI:          0.169 m LVOT/AV VTI ratio: 0.88  AORTA Ao Root diam: 3.10 cm Ao Asc diam:  3.50 cm MITRAL VALVE MV Area (PHT): 4.26 cm    SHUNTS MV Decel Time: 178 msec    Systemic VTI:  0.17 m MV E velocity: 74.70 cm/s  Systemic Diam: 1.90 cm MV A velocity: 74.70 cm/s MV E/A ratio:  1.00 Kirk Ruths MD Electronically signed by Kirk Ruths MD Signature Date/Time: 02/02/2021/2:51:02 PM    Final    CUP PACEART REMOTE DEVICE CHECK  Result Date: 01/21/2021 Scheduled remote reviewed. Normal device function.  Next remote 91 days. HB   Lab Results: Personally reviewed and as per per EMR, eos 300 2020 CBC    Component Value Date/Time   WBC 10.7 (H) 02/06/2021 0311   RBC 5.27 02/06/2021 0311   HGB 15.4 02/06/2021 0311   HGB 14.7 06/08/2020 1003   HCT 47.0 02/06/2021 0311   HCT 44.8 06/08/2020 1003   PLT 329 02/06/2021 0311   PLT 276 06/08/2020 1003   MCV 89.2 02/06/2021 0311   MCV 89 06/08/2020 1003   MCH 29.2 02/06/2021 0311   MCHC 32.8 02/06/2021 0311   RDW 13.8 02/06/2021 0311   RDW 13.1 06/08/2020 1003   LYMPHSABS 0.9 04/09/2019 0615   MONOABS 0.9 04/09/2019 0615   EOSABS 0.3 04/09/2019 0615   BASOSABS 0.0 04/09/2019 0615    BMET    Component Value Date/Time   NA 136 02/06/2021 0817   NA 149 (H) 11/16/2020 0912   K 4.0 02/06/2021 0817   CL 101 02/06/2021 0817   CO2 25 02/06/2021 0817   GLUCOSE 125 (H) 02/06/2021 0817   BUN 26 (H) 02/06/2021 0817   BUN 12 11/16/2020 0912    CREATININE 1.25 (H) 02/06/2021 0817   CALCIUM 9.5 02/06/2021 0817   GFRNONAA >60 02/06/2021 0817   GFRAA 99 06/08/2020 1003    BNP    Component Value Date/Time   BNP 40.6 12/21/2020 1520    ProBNP No results found for: PROBNP  Specialty Problems       Pulmonary Problems   Snoring   SOB (shortness of breath)   Upper respiratory tract infection   Pulmonary emphysema (HCC)   COPD with acute exacerbation (HCC)   Non-seasonal allergic rhinitis    Allergies  Allergen Reactions   Atorvastatin     Muscle weakness and fatigue on 31m daily   Spironolactone     gynecomastia   Metformin And Related Anxiety and Palpitations   Other Anxiety and Palpitations    Immunization History  Administered Date(s) Administered   Influenza,inj,Quad PF,6+ Mos 05/08/2019, 06/08/2020   PFIZER(Purple Top)SARS-COV-2 Vaccination 11/04/2019, 11/25/2019   Pneumococcal Polysaccharide-23 06/05/2019   Tetanus  05/08/2019    Past Medical History:  Diagnosis Date   Cardiogenic shock (HCC)    CHF (congestive heart failure) (HCC)    Coronary artery disease    Diabetes mellitus without complication ([facility])    Hyperlipidemia    Medical history non-contributory     Tobacco History: Social History   Tobacco Use  Smoking Status Former   Pack years: 0.00   Types: Cigarettes   Quit date: 04/08/2019   Years since quitting: 1.8  Smokeless Tobacco Never   Counseling given: Not Answered   Continue to not smoke  Outpatient Encounter Medications as of 02/11/2021  Medication Sig   Accu-Chek Softclix Lancets lancets Use to measure blood sugar twice a day   albuterol (ACCUNEB) 0.63 MG/3ML nebulizer solution Take 3 mLs (0.63 mg total) by nebulization every 6 (six) hours as needed for wheezing.   albuterol (VENTOLIN HFA) 108 (90 Base) MCG/ACT inhaler Inhale 2 puffs into the lungs every 6 (six) hours as needed for wheezing or shortness of breath.   aspirin EC 81 MG tablet Take 1 tablet (81 mg total) by  mouth daily. Swallow whole.   Blood Glucose Monitoring Suppl (ACCU-CHEK GUIDE ME) w/Device KIT 1 kit by Does not apply route in the morning and at bedtime. Use to measure blood sugar twice a day   empagliflozin (JARDIANCE) 10 MG TABS tablet Take 1 tablet (10 mg total) by mouth daily before breakfast.   ezetimibe (ZETIA) 10 MG tablet Take 1 tablet (10 mg total) by mouth daily.   fluticasone (FLONASE) 50 MCG/ACT nasal spray Place 1 spray into both nostrils daily as needed for allergies or rhinitis.   furosemide (LASIX) 40 MG tablet Take 1 tablet (40 mg total) by mouth daily.   glucose blood (ACCU-CHEK GUIDE) test strip Use to measure blood sugar twice a day   guaiFENesin (MUCINEX) 600 MG 12 hr tablet Take 1 tablet (600 mg total) by mouth 2 (two) times daily. (Patient taking differently: Take 600 mg by mouth 2 (two) times daily as needed for cough.)   Hyprom-Naphaz-Polysorb-Zn Sulf (CLEAR EYES COMPLETE) SOLN Apply 1 drop to eye 3 (three) times daily.   loratadine (CLARITIN) 10 MG tablet Take 1 tablet (10 mg total) by mouth daily.   losartan (COZAAR) 25 MG tablet Take 1 tablet (25 mg total) by mouth daily.   metoprolol succinate (TOPROL-XL) 25 MG 24 hr tablet Take 1 tablet (25 mg total) by mouth daily.   montelukast (SINGULAIR) 10 MG tablet Take 1 tablet (10 mg total) by mouth at bedtime.   nitroGLYCERIN (NITROSTAT) 0.4 MG SL tablet Place 1 tablet (0.4 mg total) under the tongue every 5 (five) minutes x 3 doses as needed for chest pain.   omeprazole (PRILOSEC) 20 MG capsule Take 1 capsule (20 mg total) by mouth daily.   potassium chloride SA (KLOR-CON) 20 MEQ tablet Take 1 tablet (20 mEq total) by mouth daily.   rosuvastatin (CRESTOR) 20 MG tablet Take 1 tablet (20 mg total) by mouth daily.   ticagrelor (BRILINTA) 90 MG TABS tablet Take 1 tablet (90 mg total) by mouth 2 (two) times daily.   tiotropium (SPIRIVA HANDIHALER) 18 MCG inhalation capsule Place 1 capsule (18 mcg total) into inhaler and inhale  daily.   traZODone (DESYREL) 50 MG tablet TAKE 1/2-1 TABLET BY MOUTH AT BEDTIME (Patient taking differently: Take 25 mg by mouth at bedtime.)   [DISCONTINUED] losartan (COZAAR) 25 MG tablet Take 0.5 tablets (12.5 mg total) by mouth daily.   [DISCONTINUED]  metoprolol succinate (TOPROL-XL) 25 MG 24 hr tablet Take 0.5 tablets (12.5 mg total) by mouth daily.   No facility-administered encounter medications on file as of 02/11/2021.     Review of Systems  Review of Systems  Denies any current chest pain with exertion, no orthopnea or PND, comprehensive review of systems otherwise negative.  Physical Exam  BP 112/74   Pulse 79   Ht 5' 8"  (1.727 m)   Wt 205 lb (93 kg)   SpO2 98% Comment: on RA  BMI 31.17 kg/m   Wt Readings from Last 5 Encounters:  02/11/21 205 lb (93 kg)  02/10/21 205 lb (93 kg)  02/06/21 190 lb 7.6 oz (86.4 kg)  01/19/21 199 lb (90.3 kg)  12/21/20 203 lb 3.2 oz (92.2 kg)    BMI Readings from Last 5 Encounters:  02/11/21 31.17 kg/m  02/10/21 31.17 kg/m  02/06/21 28.96 kg/m  01/19/21 30.26 kg/m  12/21/20 30.90 kg/m     Physical Exam General: well appearing, in NAD Eyes: EOMI, no icterus  Neck: Supple, no JVP Cardiovascular: Regular rate and rhythm, normal Pulmonary: Clear to auscultation bilaterally, distant, normal work of breathing Abdomen: Nondistended, bowel sounds present MSK: No synovitis, no joint effusion Neuro: Normal gait, no weakness Psych: Normal mood, full affect   Assessment & Plan:   DOE: resolved. Back to work, mowing. Likely largely driven by coronary thrombus seen recent heart cath as wel as heart failure with reduced EF. Feels much better after hospitalization and with starting Lasix. Maybe component of viral illness preceding admission since had some improvement with steroids.   CXR hyperinflation: Raises high suspicion for fixed obstruction. Given no symptoms, no role for further testing or therapy.    Return if symptoms  worsen or fail to improve.   Lanier Clam, MD 02/11/2021

## 2021-02-15 ENCOUNTER — Other Ambulatory Visit (HOSPITAL_COMMUNITY): Payer: Self-pay

## 2021-02-15 ENCOUNTER — Telehealth: Payer: Self-pay

## 2021-02-15 MED ORDER — EMPAGLIFLOZIN 10 MG PO TABS: 10.0000 mg | ORAL_TABLET | Freq: Every day | ORAL | 3 refills | Status: DC

## 2021-02-15 NOTE — Telephone Encounter (Signed)
I have submitted a prior authorization for Jardiance.

## 2021-02-15 NOTE — Telephone Encounter (Signed)
Prior authorization approved through 02/15/22, pt is aware.

## 2021-02-15 NOTE — Telephone Encounter (Signed)
Spoke with patient who states he received a call from his pharmacy stating his insurance will not cover the Jardiance. Advised that I would speak with the pharmacy and his his doctors nurse and get back to him. He voiced understanding.

## 2021-02-25 ENCOUNTER — Ambulatory Visit (INDEPENDENT_AMBULATORY_CARE_PROVIDER_SITE_OTHER): Payer: Medicaid Other | Admitting: Pharmacist

## 2021-02-25 ENCOUNTER — Other Ambulatory Visit: Payer: Self-pay

## 2021-02-25 VITALS — BP 120/72 | HR 87 | Wt 207.0 lb

## 2021-02-25 DIAGNOSIS — E782 Mixed hyperlipidemia: Secondary | ICD-10-CM

## 2021-02-25 DIAGNOSIS — I2510 Atherosclerotic heart disease of native coronary artery without angina pectoris: Secondary | ICD-10-CM

## 2021-02-25 DIAGNOSIS — I5022 Chronic systolic (congestive) heart failure: Secondary | ICD-10-CM | POA: Diagnosis not present

## 2021-02-25 MED ORDER — METOPROLOL SUCCINATE ER 50 MG PO TB24: 50.0000 mg | ORAL_TABLET | Freq: Every day | ORAL | 3 refills | Status: DC

## 2021-02-25 MED ORDER — ROSUVASTATIN CALCIUM 20 MG PO TABS: 20.0000 mg | ORAL_TABLET | Freq: Every day | ORAL | 3 refills | Status: DC

## 2021-02-25 NOTE — Patient Instructions (Signed)
Decrease your rosuvastatin (Crestor) from 40mg  to 20mg  daily. I have sent in a refill of the lower dose so you can take 1 tablet daily. See if your joint pain in your shoulders and your leg cramps improve  Increase your metoprolol from 25mg  to 50mg  once daily. You can take 2 of your current 25mg  tablets until you run out, then pick up the new prescription for the 50mg  dose and take 1 pill daily  We will check lab work today and I'll call you tomorrow with the results. Ideally, we would stop your losartan and restart Entresto. We may also decrease your Lasix dose.
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
## 2021-02-26 LAB — BASIC METABOLIC PANEL
BUN/Creatinine Ratio: 18 (ref 10–24)
BUN: 28 mg/dL — ABNORMAL HIGH (ref 8–27)
CO2: 24 mmol/L (ref 20–29)
Calcium: 10.3 mg/dL — ABNORMAL HIGH (ref 8.6–10.2)
Chloride: 100 mmol/L (ref 96–106)
Creatinine, Ser: 1.58 mg/dL — ABNORMAL HIGH (ref 0.76–1.27)
Glucose: 99 mg/dL (ref 65–99)
Potassium: 5 mmol/L (ref 3.5–5.2)
Sodium: 144 mmol/L (ref 134–144)
eGFR: 49 mL/min/{1.73_m2} — ABNORMAL LOW (ref >59)

## 2021-02-26 MED ORDER — FUROSEMIDE 40 MG PO TABS: 40.0000 mg | ORAL_TABLET | ORAL | 5 refills | Status: DC | PRN

## 2021-02-26 MED ORDER — POTASSIUM CHLORIDE CRYS ER 20 MEQ PO TBCR: 20.0000 meq | EXTENDED_RELEASE_TABLET | ORAL | 5 refills | Status: DC | PRN

## 2021-02-26 MED ORDER — ENTRESTO 24-26 MG PO TABS: 1.0000 | ORAL_TABLET | Freq: Two times a day (BID) | ORAL | 3 refills | Status: DC

## 2021-02-26 MED ORDER — METOPROLOL SUCCINATE ER 50 MG PO TB24: 50.0000 mg | ORAL_TABLET | Freq: Every day | ORAL | 3 refills | Status: DC

## 2021-03-04 ENCOUNTER — Other Ambulatory Visit: Payer: Self-pay

## 2021-03-04 ENCOUNTER — Other Ambulatory Visit: Payer: Medicaid Other

## 2021-03-04 DIAGNOSIS — I5022 Chronic systolic (congestive) heart failure: Secondary | ICD-10-CM | POA: Diagnosis not present

## 2021-03-05 ENCOUNTER — Telehealth: Payer: Self-pay | Admitting: Pharmacist

## 2021-03-05 LAB — BASIC METABOLIC PANEL
BUN/Creatinine Ratio: 17 (ref 10–24)
BUN: 21 mg/dL (ref 8–27)
CO2: 19 mmol/L — ABNORMAL LOW (ref 20–29)
Calcium: 10 mg/dL (ref 8.6–10.2)
Chloride: 103 mmol/L (ref 96–106)
Creatinine, Ser: 1.23 mg/dL (ref 0.76–1.27)
Glucose: 124 mg/dL — ABNORMAL HIGH (ref 65–99)
Potassium: 5.2 mmol/L (ref 3.5–5.2)
Sodium: 141 mmol/L (ref 134–144)
eGFR: 66 mL/min/{1.73_m2} (ref >59)

## 2021-03-05 NOTE — Telephone Encounter (Signed)
Renal function has improved and is back to baseline after changing Lasix/K back to prn dosing from daily dosing and changing losartan to Entresto. Left message for pt to discuss. Will confirm frequency of Lasix and K administration at home. If he hasn't been using any, will continue current meds until his CHF appt with Dr Haroldine Laws on 8/2. If he has been, will advise him to stop K supplementation as K of 5.2 will prevent [place] from increasing his Entresto further or adding on aldosterone antagonist therapy.

## 2021-03-05 NOTE — Telephone Encounter (Signed)
Patient called back.  Has not taken any Lasix or K in past week.  Advised to continue to hold Lasix and potassium and continue other meds as prescribed until appt at CHF clinic.  Patient voiced understanding.

## 2021-03-07 ENCOUNTER — Other Ambulatory Visit: Payer: Self-pay | Admitting: Internal Medicine

## 2021-03-07 DIAGNOSIS — G4709 Other insomnia: Secondary | ICD-10-CM

## 2021-03-07 NOTE — Telephone Encounter (Signed)
Requested Prescriptions  Pending Prescriptions Disp Refills  . traZODone (DESYREL) 50 MG tablet [Pharmacy Med Name: traZODone HCl 50 MG Oral Tablet] 30 tablet 0    Sig: TAKE 1/2 TO 1 (ONE-HALF TO ONE) TABLET BY MOUTH EVERY DAY AT BEDTIME     Psychiatry: Antidepressants - Serotonin Modulator Passed - 03/07/2021 12:22 PM      Passed - Valid encounter within last 6 months    Recent Outpatient Visits          3 months ago Essential hypertension   [place], Jarome Matin, RPH-CPP   3 months ago Essential hypertension   [place], Jarome Matin, RPH-CPP   4 months ago Essential hypertension   [place], Stephen L, RPH-CPP   5 months ago Essential hypertension   [place], MD   9 months ago Need for influenza vaccination   [place], RPH-CPP      Future Appointments            In 2 weeks Troy Sine, MD [facility] [facility], [facility]

## 2021-03-23 ENCOUNTER — Other Ambulatory Visit: Payer: Self-pay

## 2021-03-23 ENCOUNTER — Encounter (HOSPITAL_COMMUNITY): Payer: Self-pay | Admitting: Internal Medicine

## 2021-03-23 ENCOUNTER — Ambulatory Visit (HOSPITAL_COMMUNITY)
Admission: RE | Admit: 2021-03-23 | Discharge: 2021-03-23 | Disposition: A | Discharge status: Home / Self Care | Payer: Medicaid Other | Source: Ambulatory Visit | Attending: Internal Medicine | Admitting: Internal Medicine

## 2021-03-23 VITALS — BP 168/94 | HR 77 | Wt 209.6 lb

## 2021-03-23 DIAGNOSIS — I5023 Acute on chronic systolic (congestive) heart failure: Secondary | ICD-10-CM | POA: Insufficient documentation

## 2021-03-23 DIAGNOSIS — I5022 Chronic systolic (congestive) heart failure: Secondary | ICD-10-CM

## 2021-03-23 DIAGNOSIS — I110 Hypertensive heart disease with heart failure: Secondary | ICD-10-CM | POA: Diagnosis not present

## 2021-03-23 DIAGNOSIS — I2510 Atherosclerotic heart disease of native coronary artery without angina pectoris: Secondary | ICD-10-CM | POA: Diagnosis not present

## 2021-03-23 DIAGNOSIS — Z7982 Long term (current) use of aspirin: Secondary | ICD-10-CM | POA: Insufficient documentation

## 2021-03-23 DIAGNOSIS — I252 Old myocardial infarction: Secondary | ICD-10-CM | POA: Insufficient documentation

## 2021-03-23 DIAGNOSIS — I10 Essential (primary) hypertension: Secondary | ICD-10-CM

## 2021-03-23 DIAGNOSIS — Z87891 Personal history of nicotine dependence: Secondary | ICD-10-CM | POA: Insufficient documentation

## 2021-03-23 DIAGNOSIS — Z7984 Long term (current) use of oral hypoglycemic drugs: Secondary | ICD-10-CM | POA: Insufficient documentation

## 2021-03-23 DIAGNOSIS — J449 Chronic obstructive pulmonary disease, unspecified: Secondary | ICD-10-CM | POA: Diagnosis not present

## 2021-03-23 DIAGNOSIS — Z955 Presence of coronary angioplasty implant and graft: Secondary | ICD-10-CM | POA: Insufficient documentation

## 2021-03-23 DIAGNOSIS — E119 Type 2 diabetes mellitus without complications: Secondary | ICD-10-CM | POA: Insufficient documentation

## 2021-03-23 DIAGNOSIS — Z95810 Presence of automatic (implantable) cardiac defibrillator: Secondary | ICD-10-CM | POA: Insufficient documentation

## 2021-03-23 DIAGNOSIS — I2582 Chronic total occlusion of coronary artery: Secondary | ICD-10-CM | POA: Diagnosis not present

## 2021-03-23 DIAGNOSIS — Z79899 Other long term (current) drug therapy: Secondary | ICD-10-CM | POA: Insufficient documentation

## 2021-03-23 LAB — BASIC METABOLIC PANEL
Anion gap: 7 (ref 5–15)
BUN: 16 mg/dL (ref 8–23)
CO2: 26 mmol/L (ref 22–32)
Calcium: 9.8 mg/dL (ref 8.9–10.3)
Chloride: 106 mmol/L (ref 98–111)
Creatinine, Ser: 1.04 mg/dL (ref 0.61–1.24)
GFR, Estimated: >60 mL/min (ref >60)
Glucose, Bld: 113 mg/dL — ABNORMAL HIGH (ref 70–99)
Potassium: 4.2 mmol/L (ref 3.5–5.1)
Sodium: 139 mmol/L (ref 135–145)

## 2021-03-23 LAB — BRAIN NATRIURETIC PEPTIDE: B Natriuretic Peptide: 325.9 pg/mL — ABNORMAL HIGH (ref 0.0–100.0)

## 2021-03-23 MED ORDER — FUROSEMIDE 40 MG PO TABS: ORAL_TABLET | ORAL | 5 refills | Status: DC

## 2021-03-23 MED ORDER — ENTRESTO 49-51 MG PO TABS: 1.0000 | ORAL_TABLET | Freq: Two times a day (BID) | ORAL | 5 refills | Status: DC

## 2021-03-23 NOTE — Progress Notes (Signed)
ReDS Vest / Clip - 03/23/21 1500       ReDS Vest / Clip   Station Marker D    Ruler Value 33    ReDS Value Range Low volume    ReDS Actual Value 33

## 2021-03-23 NOTE — Progress Notes (Signed)
Advanced Heart Failure Clinic Note   Referring Physician: PCP: Stephen Pier, MD PCP-Cardiologist: Shelva Majestic, MD   HPI:  Stephen Jennings is a 65 y.o. 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   Brought to ER 03/31/19 with respiratory failure/cardiac arrest. Intubated in ER. Initial ECG with anterior ST elevation. Cath by Dr. Claiborne Billings with 3v CAD and totally occluded prox LAD. LCX anomalous off RCA. Underwent PCI of LAD which was diffusely diseased vessel. Developed cardiogenic shock requiring pressors and mechanical support with impella.  03/31/2019 Echo 20-25%. Hospital course complicated by Torsades. EP consulted placed ICD prior to discharge.    Echo 2/21  EF 45-50%   Echo 4/22 EF 55-60%    He stopped Brillinta in 4/22. Admitted in 6/22 with NSTEMI. Cath with patent proximal LAD stent with mild 20% in-stent narrowing.  There is significant thrombus burden immediately after the stent extending into the diagonal vessel and mid LAD with TIMI-3 flow. Treated with Aggrastat x 48 hours. ECHO 02/02/21 EF 35-40% Relook angiography of the left coronary system showed improvement in the previous thrombus burden in the diagonal and LAD distal stent and beyond but moderate residual thrombus was still present. Underwent successful percutaneous coronary intervention of the LAD utilizing Pronto thrombectomy, intravascular ultrasound and ultimate PTCA of the distal stented segment and mid LAD with residual narrowing 0%. Recommended indefinite DAPT.   Here for post-hospital f/u. Says he fet great when he left the hospital but over the past week has been more SOB. Gets SOB with activity or lying down. Had PND last night. No CP. Seen in PharmD Clinic on 02/25/21 and lasix stopped due to mild AKI. B-blocker increased and losartan changed to Entresto 24/26 bid. Not taking lasix or Entresto.    Cath: 02/02/21   Prox RCA lesion is 20% stenosed. Prox LAD to Mid LAD lesion is 15%  stenosed. 1st Diag lesion is 5% stenosed. Ost LAD to Prox LAD lesion is 20% stenosed. Dist LAD-1 lesion is 40% stenosed. Dist LAD-2 lesion is 80% stenosed. There is severe left ventricular systolic dysfunction. LV end diastolic pressure is severely elevated. The left ventricular ejection fraction is 25-35% by visual estimate.  Past Medical History:  Diagnosis Date   Cardiogenic shock (HCC)    CHF (congestive heart failure) (HCC)    Coronary artery disease    Diabetes mellitus without complication ([place])    Hyperlipidemia    Medical history non-contributory     Current Outpatient Medications  Medication Sig Dispense Refill   Accu-Chek Softclix Lancets lancets Use to measure blood sugar twice a day 100 each 6   albuterol (ACCUNEB) 0.63 MG/3ML nebulizer solution Take 3 mLs (0.63 mg total) by nebulization every 6 (six) hours as needed for wheezing. 75 mL 12   albuterol (VENTOLIN HFA) 108 (90 Base) MCG/ACT inhaler Inhale 2 puffs into the lungs every 6 (six) hours as needed for wheezing or shortness of breath. 18 g 2   aspirin EC 81 MG tablet Take 1 tablet (81 mg total) by mouth daily. Swallow whole. 150 tablet 2   Blood Glucose Monitoring Suppl (ACCU-CHEK GUIDE ME) w/Device KIT 1 kit by Does not apply route in the morning and at bedtime. Use to measure blood sugar twice a day 1 kit 0   empagliflozin (JARDIANCE) 10 MG TABS tablet Take 1 tablet (10 mg total) by mouth daily before breakfast. 90 tablet 3   ezetimibe (ZETIA) 10 MG tablet Take 1 tablet (10  mg total) by mouth daily. 90 tablet 3   fluticasone (FLONASE) 50 MCG/ACT nasal spray Place 1 spray into both nostrils daily as needed for allergies or rhinitis. 16 g 2   furosemide (LASIX) 40 MG tablet Take 1 tablet (40 mg total) by mouth as needed for edema. 30 tablet 5   glucose blood (ACCU-CHEK GUIDE) test strip Use to measure blood sugar twice a day 100 each 6   Hyprom-Naphaz-Polysorb-Zn Sulf (CLEAR EYES COMPLETE) SOLN Apply 1 drop to eye 3  (three) times daily. 15 mL 1   loratadine (CLARITIN) 10 MG tablet Take 1 tablet (10 mg total) by mouth daily. 30 tablet 11   metoprolol succinate (TOPROL-XL) 50 MG 24 hr tablet Take 1 tablet (50 mg total) by mouth daily. 30 tablet 3   montelukast (SINGULAIR) 10 MG tablet Take 1 tablet (10 mg total) by mouth at bedtime. 30 tablet 3   nitroGLYCERIN (NITROSTAT) 0.4 MG SL tablet Place 1 tablet (0.4 mg total) under the tongue every 5 (five) minutes x 3 doses as needed for chest pain. 25 tablet 3   omeprazole (PRILOSEC) 20 MG capsule Take 1 capsule (20 mg total) by mouth daily. 30 capsule 3   potassium chloride SA (KLOR-CON) 20 MEQ tablet Take 1 tablet (20 mEq total) by mouth as needed. Take when you take a dose of Lasix 30 tablet 5   rosuvastatin (CRESTOR) 20 MG tablet Take 1 tablet (20 mg total) by mouth daily. 90 tablet 3   sacubitril-valsartan (ENTRESTO) 24-26 MG Take 1 tablet by mouth 2 (two) times daily. 60 tablet 3   ticagrelor (BRILINTA) 90 MG TABS tablet Take 1 tablet (90 mg total) by mouth 2 (two) times daily. 180 tablet 3   tiotropium (SPIRIVA HANDIHALER) 18 MCG inhalation capsule Place 1 capsule (18 mcg total) into inhaler and inhale daily. 30 capsule 2   traZODone (DESYREL) 50 MG tablet TAKE 1/2 TO 1 (ONE-HALF TO ONE) TABLET BY MOUTH EVERY DAY AT BEDTIME 30 tablet 0   No current facility-administered medications for this encounter.    Allergies  Allergen Reactions   Atorvastatin     Muscle weakness and fatigue on 10m daily   Spironolactone     gynecomastia   Metformin And Related Anxiety and Palpitations   Other Anxiety and Palpitations      Social History   Socioeconomic History   Marital status: Married    Spouse name: Not on file   Number of children: 1   Years of education: 8   Highest education level: 8th grade  Occupational History   Not on file  Tobacco Use   Smoking status: Former    Types: Cigarettes    Quit date: 04/08/2019    Years since quitting: 1.9    Smokeless tobacco: Never  Vaping Use   Vaping Use: Never used  Substance and Sexual Activity   Alcohol use: No   Drug use: No   Sexual activity: Not Currently  Other Topics Concern   Not on file  Social History Narrative   Lives with wife   Right handed   Caffeine: about 2 cups of coffee every morning, no soda   Social Determinants of Health   Financial Resource Strain: Not on file  Food Insecurity: Not on file  Transportation Needs: Not on file  Physical Activity: Not on file  Stress: Not on file  Social Connections: Not on file  Intimate Partner Violence: Not on file      Family History  Problem Relation Age of Onset   Hypertension Mother    Heart failure Mother    Headache Neg Hx    Migraines Neg Hx     Vitals:   03/23/21 1457  BP: (!) 168/94  Pulse: 77  SpO2: 93%  Weight: 95.1 kg (209 lb 9.6 oz)   Wt Readings from Last 3 Encounters:  03/23/21 95.1 kg (209 lb 9.6 oz)  02/25/21 93.9 kg (207 lb)  02/11/21 93 kg (205 lb)      PHYSICAL EXAM: General:  Well appearing. No resp difficulty HEENT: normal Neck: supple. nVP 7-8. Carotids 2+ bilat; no bruits. No lymphadenopathy or thryomegaly appreciated. Cor: PMI nondisplaced. Regular rate & rhythm. No rubs, gallops or murmurs. Lungs: + wheezing Abdomen: soft, nontender, +distended. No hepatosplenomegaly. No bruits or masses. Good bowel sounds. Extremities: no cyanosis, clubbing, rash, 1+ edema Neuro: alert & orientedx3, cranial nerves grossly intact. moves all 4 extremities w/o difficulty. Affect pleasant   ECG: NSR 66 high lateral Qs  No ST-T wave abnormalities. Personally reviewed    ASSESSMENT & PLAN:  1. Acute on chronic systolic HF - Echo 7/54 GB20-10% - Echo 09/23/19: EF 45-50% - Echo 4/22 EF 55-60%  - NSTEMI in 6/22 -> Echo 6/22 EF 35-40% - He is volume overloaded today in setting of recent change in his diuretic and HF regimen due to AKI. (ReDS 33% - baseline likely lower due to COPD) - NYHA III  in setting of volume overload and elevated BP - Will have him take lasix 64m daily x 2 days then 40 mg every MWF - Increase Entresto to 49/51 bid - Continue Toprol 50 mg daily for now. If wheezing does not improve with diuresis would switch to bisoprolol 5 - Continue Jardiance 10 mg daily - Labs today - F/u NP/PA in 2 weeks - Suspect EF will improve as he is farther out from recent NSTEMI  2. Cardiac arrest/VT/torsades 03/1999 - Now s/p ICD - ICD interrogated personally in clinic. No VT/AF   3. CAD  - s/p acute anterior MI 8/20 with PCI/DES to LAD. Diffusely diseased vessel - + residual CAD in RCA and anomalous LCX - NSTEMI in 6/22 after stopping Brillinta. Stent patent but clot after stent -> s/p thrombectomy and POBA - Continue DAPT and statin indefinitely - No s/s ischemia currently (do not think HF driven by ischemia) - Continue jardiance - Refer for cardiac rehab   4. DM2 - On metformin & Jardiance 10   5. COPD - quit smoking 8/20  6. HTN - BP up - Increase Entresto to 49/51 bid   Total time spent 45 minutes. Over half that time spent discussing above.    DGlori Bickers MD 03/23/21

## 2021-03-23 NOTE — Patient Instructions (Addendum)
Labs were performed today, if lab results are abnormal the clinic will call you  EKG was performed today  You have been referred to cardiac Rehab and they will call you to make a appointment.  INCREASE your Entresto to 49/51 mg (1 tablet) twice daily   TAKE Lasix 40 mg (1 tablet) daily for 2 days THEN TAKE Lasix 40 mg (1 tablet) daily ON Monday Wednesday and Friday  Your physician recommends that you schedule a follow-up appointment in: in 2 weeks  milAt the [facility], you and your health needs are our priority. As part of our continuing mission to provide you with exceptional heart care, we have created designated Provider Care Teams. These Care Teams include your primary Cardiologist (physician) and Advanced Practice Providers (APPs- Physician Assistants and Nurse Practitioners) who all work together to provide you with the care you need, when you need it.   You may see any of the following providers on your designated Care Team at your next follow up: Dr [name] Dr Loralie Champagne Dr Patrice Paradise, NP Lyda Jester, [place] Ginnie Smart Audry Riles, PharmD   Please be sure to bring in all your medications bottles to every appointment.    If you have any questions or concerns before your next appointment please send [place] a message through [place] or call our office at [phone].    TO LEAVE A MESSAGE FOR THE NURSE SELECT OPTION 2, PLEASE LEAVE A MESSAGE INCLUDING: YOUR NAME DATE OF BIRTH CALL BACK NUMBER REASON FOR CALL**this is important as we prioritize the call backs  YOU WILL RECEIVE A CALL BACK THE SAME DAY AS LONG AS YOU CALL BEFORE 4:00 PM

## 2021-03-24 ENCOUNTER — Ambulatory Visit: Payer: Medicaid Other | Admitting: Cardiovascular Disease

## 2021-03-24 ENCOUNTER — Encounter: Payer: Self-pay | Admitting: Cardiovascular Disease

## 2021-03-24 VITALS — BP 120/78 | HR 81 | Ht 68.0 in | Wt 202.8 lb

## 2021-03-24 DIAGNOSIS — E782 Mixed hyperlipidemia: Secondary | ICD-10-CM

## 2021-03-24 DIAGNOSIS — I255 Ischemic cardiomyopathy: Secondary | ICD-10-CM

## 2021-03-24 DIAGNOSIS — E785 Hyperlipidemia, unspecified: Secondary | ICD-10-CM | POA: Diagnosis not present

## 2021-03-24 DIAGNOSIS — I2102 ST elevation (STEMI) myocardial infarction involving left anterior descending coronary artery: Secondary | ICD-10-CM

## 2021-03-24 DIAGNOSIS — Z95810 Presence of automatic (implantable) cardiac defibrillator: Secondary | ICD-10-CM | POA: Diagnosis not present

## 2021-03-24 DIAGNOSIS — I5023 Acute on chronic systolic (congestive) heart failure: Secondary | ICD-10-CM | POA: Diagnosis not present

## 2021-03-24 DIAGNOSIS — I10 Essential (primary) hypertension: Secondary | ICD-10-CM

## 2021-03-24 DIAGNOSIS — I2510 Atherosclerotic heart disease of native coronary artery without angina pectoris: Secondary | ICD-10-CM

## 2021-03-24 MED ORDER — ROSUVASTATIN CALCIUM 40 MG PO TABS: 40.0000 mg | ORAL_TABLET | Freq: Every day | ORAL | 3 refills | Status: DC

## 2021-03-24 NOTE — Patient Instructions (Signed)
Medication Instructions:  INCREASE rosuvastatin to '40mg'$  daily. Per Dr. Claiborne Billings you may take two of your '20mg'$  tablets daily until you are out, then you may pick up your '40mg'$  tablets and take 1 tablet daily.   *If you need a refill on your cardiac medications before your next appointment, please call your pharmacy*   Lab Work: LFT today, FASTING lipid and Cmet in 3 months.   If you have labs (blood work) drawn today and your tests are completely normal, you will receive your results only by: [place] (if you have MyChart) OR A paper copy in the mail If you have any lab test that is abnormal or we need to change your treatment, we will call you to review the results.   Testing/Procedures: None ordered.    Follow-Up: At [facility], you and your health needs are our priority.  As part of our continuing mission to provide you with exceptional heart care, we have created designated Provider Care Teams.  These Care Teams include your primary Cardiologist (physician) and Advanced Practice Providers (APPs -  Physician Assistants and Nurse Practitioners) who all work together to provide you with the care you need, when you need it.  We recommend signing up for the patient portal called "MyChart".  Sign up information is provided on this After Visit Summary.  MyChart is used to connect with patients for Virtual Visits (Telemedicine).  Patients are able to view lab/test results, encounter notes, upcoming appointments, etc.  Non-urgent messages can be sent to your provider as well.   To learn more about what you can do with MyChart, go to [email or web address].    Your next appointment:   4 month(s)  The format for your next appointment:   In Person  Provider:   Shelva Majestic, MD

## 2021-03-24 NOTE — Progress Notes (Signed)
Cardiology Office Note    Date:  03/31/2021   ID:  Stephen Jennings, DOB [birth year], MRN [id number]  PCP:  Ladell Pier, MD  Cardiologist:  Shelva Majestic, MD   Initial office evaluation with me.  History of Present Illness:  Stephen Jennings is a 65 y.o. male who has a history of PAD and suffered an acute coronary syndrome in August 2020 with acute respiratory failure and cardiogenic shock in the setting of an anterior ST segment elevation myocardial infarction.  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  The RCA had 70% proximal stenosis.  The LAD was occluded proximally with TIMI 0 flow and he underwent successful intervention to the LAD PTCA and stenting of the proximal LAD, PTCA of the ostium of the diagonal vessel, and PTCA of the mid LAD with insertion of a 2.75 x 26 mm Resolute stent to the proximal LAD before the diagonal bifurcation.  Due to cardiogenic shock an Impella catheter was inserted for hemodynamic support.  Subsequently, he developed torsade and underwent ICD implantation.  An echo Doppler study in April 2022 showed normalization of LV function with EF at 55 to 60%.  Patient had done well and apparently stopped taking Brilinta in April 2022, 6 weeks prior to his most recent ACS which occurred on February 02, 2021.  Catheterization showed EF estimated 25 to 30% with significant thrombus burden in the LAD immediately after the stent extending into the diagonal vessel.  He was treated with Aggrastat and bolused with Brilinta and 2 days later return for repeat catheterization by me on February 04, 2021.  Relook angiography of the left coronary system showed improvement in the previous thrombus burden in the diagonal and LAD distal stent and beyond but moderate residual thrombus was still present for which he underwent successful percutaneous coronary intervention utilizing Pronto thrombectomy,  intravascular ultrasound, and ultimate PTCA of the distal stented segment and mid LAD.  Was recommended that he continue DAPT indefinitely.  Subsequently, the patient has been followed by Dr. Haroldine Laws at [facility] clinic.  He was last seen by Dr. Haroldine Laws on March 23, 2021 at which time he admitted that over the past week he was experiencing more shortness of breath with activity or lying down.  Prior to that evaluation he had been seen in the Pharm.D. clinic and Lasix was stopped due to mild HKA and his beta-blocker increased and losartan was changed to [facility] 24/26 twice daily.  When seen by Dr. Haroldine Laws recently, it was recommended that Entresto be increased to 49/51 mg twice daily and that he take Lasix 40 mg for 2 days then 40 mg on Monday Wednesday and Friday.  He also has been on Jardiance 10 mg daily and had been on metoprolol succinate with plans to possibly switch to bisoprolol if wheezing develops.  His ICD was interrogated and there was no VT or AF noted.  Presently, he feels well.  He has not had any recurrent chest pain.  Breathing has improved with the recent resumption of Lasix.  He continues to be on aspirin and Brilinta 90 mg twice daily for DAPT.  He presents for his initial evaluation with me following his June 2022 hospitalization.   Past Medical History:  Diagnosis Date   Cardiogenic shock ([place])    CHF (congestive heart failure) ([place])    Coronary artery disease    Diabetes mellitus without complication ([place])  Hyperlipidemia    Medical history non-contributory     Past Surgical History:  Procedure Laterality Date   CARDIAC CATHETERIZATION     CORONARY BALLOON ANGIOPLASTY N/A 02/04/2021   Procedure: CORONARY BALLOON ANGIOPLASTY;  Surgeon: Troy Sine, MD;  Location: [place] [facility];  Service: Cardiovascular;  Laterality: N/A;   CORONARY THROMBECTOMY N/A 02/04/2021   Procedure: Coronary Thrombectomy;  Surgeon: Troy Sine, MD;  Location: [place] [facility]  [facility];  Service: Cardiovascular;  Laterality: N/A;   CORONARY/GRAFT ACUTE MI REVASCULARIZATION N/A 03/31/2019   Procedure: Coronary/Graft Acute MI Revascularization;  Surgeon: Troy Sine, MD;  Location: [place] [facility];  Service: Cardiovascular;  Laterality: N/A;   ICD IMPLANT N/A 04/15/2019   Procedure: ICD IMPLANT;  Surgeon: Constance Haw, MD;  Location: [place] [facility];  Service: Cardiovascular;  Laterality: N/A;   INTRAVASCULAR ULTRASOUND/IVUS N/A 02/04/2021   Procedure: Intravascular Ultrasound/IVUS;  Surgeon: Troy Sine, MD;  Location: [place] [facility];  Service: Cardiovascular;  Laterality: N/A;   LEFT HEART CATH AND CORONARY ANGIOGRAPHY N/A 03/31/2019   Procedure: LEFT HEART CATH AND CORONARY ANGIOGRAPHY;  Surgeon: Troy Sine, MD;  Location: [place] [facility];  Service: Cardiovascular;  Laterality: N/A;   LEFT HEART CATH AND CORONARY ANGIOGRAPHY N/A 02/02/2021   Procedure: LEFT HEART CATH AND CORONARY ANGIOGRAPHY;  Surgeon: Troy Sine, MD;  Location: [place] [facility];  Service: Cardiovascular;  Laterality: N/A;   LEFT HEART CATH AND CORONARY ANGIOGRAPHY N/A 02/04/2021   Procedure: LEFT HEART CATH AND CORONARY ANGIOGRAPHY;  Surgeon: Troy Sine, MD;  Location: [place] [facility];  Service: Cardiovascular;  Laterality: N/A;   NO PAST SURGERIES     RIGHT HEART CATH N/A 03/31/2019   Procedure: RIGHT HEART CATH;  Surgeon: Troy Sine, MD;  Location: [place] [facility];  Service: Cardiovascular;  Laterality: N/A;   VENTRICULAR ASSIST DEVICE INSERTION N/A 03/31/2019   Procedure: VENTRICULAR ASSIST DEVICE INSERTION;  Surgeon: Troy Sine, MD;  Location: [place] [facility];  Service: Cardiovascular;  Laterality: N/A;    Current Medications: Outpatient Medications Prior to Visit  Medication Sig Dispense Refill   Accu-Chek Softclix Lancets lancets Use to measure blood sugar twice a day 100 each 6   albuterol (ACCUNEB) 0.63 MG/3ML nebulizer solution Take 3 mLs  (0.63 mg total) by nebulization every 6 (six) hours as needed for wheezing. 75 mL 12   albuterol (VENTOLIN HFA) 108 (90 Base) MCG/ACT inhaler Inhale 2 puffs into the lungs every 6 (six) hours as needed for wheezing or shortness of breath. 18 g 2   aspirin EC 81 MG tablet Take 1 tablet (81 mg total) by mouth daily. Swallow whole. 150 tablet 2   Blood Glucose Monitoring Suppl (ACCU-CHEK GUIDE ME) w/Device KIT 1 kit by Does not apply route in the morning and at bedtime. Use to measure blood sugar twice a day 1 kit 0   empagliflozin (JARDIANCE) 10 MG TABS tablet Take 1 tablet (10 mg total) by mouth daily before breakfast. 90 tablet 3   ezetimibe (ZETIA) 10 MG tablet Take 1 tablet (10 mg total) by mouth daily. 90 tablet 3   fluticasone (FLONASE) 50 MCG/ACT nasal spray Place 1 spray into both nostrils daily as needed for allergies or rhinitis. 16 g 2   furosemide (LASIX) 40 MG tablet Take 1 tablet every Monday Wednesday Friday 30 tablet 5   glucose blood (ACCU-CHEK GUIDE) test strip Use to measure blood sugar  twice a day 100 each 6   Hyprom-Naphaz-Polysorb-Zn Sulf (CLEAR EYES COMPLETE) SOLN Apply 1 drop to eye 3 (three) times daily. 15 mL 1   loratadine (CLARITIN) 10 MG tablet Take 1 tablet (10 mg total) by mouth daily. 30 tablet 11   metoprolol succinate (TOPROL-XL) 50 MG 24 hr tablet Take 1 tablet (50 mg total) by mouth daily. 30 tablet 3   montelukast (SINGULAIR) 10 MG tablet Take 1 tablet (10 mg total) by mouth at bedtime. 30 tablet 3   nitroGLYCERIN (NITROSTAT) 0.4 MG SL tablet Place 1 tablet (0.4 mg total) under the tongue every 5 (five) minutes x 3 doses as needed for chest pain. 25 tablet 3   omeprazole (PRILOSEC) 20 MG capsule Take 1 capsule (20 mg total) by mouth daily. 30 capsule 3   potassium chloride SA (KLOR-CON) 20 MEQ tablet Take 1 tablet (20 mEq total) by mouth as needed. Take when you take a dose of Lasix 30 tablet 5   sacubitril-valsartan (ENTRESTO) 49-51 MG Take 1 tablet by mouth 2  (two) times daily. 60 tablet 5   ticagrelor (BRILINTA) 90 MG TABS tablet Take 1 tablet (90 mg total) by mouth 2 (two) times daily. 180 tablet 3   tiotropium (SPIRIVA HANDIHALER) 18 MCG inhalation capsule Place 1 capsule (18 mcg total) into inhaler and inhale daily. 30 capsule 2   traZODone (DESYREL) 50 MG tablet TAKE 1/2 TO 1 (ONE-HALF TO ONE) TABLET BY MOUTH EVERY DAY AT BEDTIME 30 tablet 0   rosuvastatin (CRESTOR) 20 MG tablet Take 1 tablet (20 mg total) by mouth daily. 90 tablet 3   No facility-administered medications prior to visit.     Allergies:   Atorvastatin, Spironolactone, Metformin and related, and Other   Social History   Socioeconomic History   Marital status: Married    Spouse name: Not on file   Number of children: 1   Years of education: 8   Highest education level: 8th grade  Occupational History   Not on file  Tobacco Use   Smoking status: Former    Types: Cigarettes    Quit date: 04/08/2019    Years since quitting: 1.9   Smokeless tobacco: Never  Vaping Use   Vaping Use: Never used  Substance and Sexual Activity   Alcohol use: No   Drug use: No   Sexual activity: Not Currently  Other Topics Concern   Not on file  Social History Narrative   Lives with wife   Right handed   Caffeine: about 2 cups of coffee every morning, no soda   Social Determinants of Health   Financial Resource Strain: Not on file  Food Insecurity: Not on file  Transportation Needs: Not on file  Physical Activity: Not on file  Stress: Not on file  Social Connections: Not on file     Family History:  The patient's family history includes Heart failure in his mother; Hypertension in his mother.   ROS General: Negative; No fevers, chills, or night sweats;  HEENT: Negative; No changes in vision or hearing, sinus congestion, difficulty swallowing Pulmonary: Negative; No cough, wheezing, shortness of breath, hemoptysis Cardiovascular: The HPI GI: Negative; No nausea, vomiting,  diarrhea, or abdominal pain GU: Negative; No dysuria, hematuria, or difficulty voiding Musculoskeletal: Negative; no myalgias, joint pain, or weakness Hematologic/Oncology: Negative; no easy bruising, bleeding Endocrine: Negative; no heat/cold intolerance; no diabetes Neuro: Negative; no changes in balance, headaches Skin: Negative; No rashes or skin lesions Psychiatric: Negative; No behavioral problems, depression  Sleep: Negative; No snoring, daytime sleepiness, hypersomnolence, bruxism, restless legs, hypnogognic hallucinations, no cataplexy Other comprehensive 14 point system review is negative.   PHYSICAL EXAM:   VS:  BP 120/78   Pulse 81   Ht 5' 8"  (1.727 m)   Wt 202 lb 12.8 oz (92 kg)   SpO2 96%   BMI 30.84 kg/m     Repeat blood pressure by me 126/78  Wt Readings from Last 3 Encounters:  03/24/21 202 lb 12.8 oz (92 kg)  03/23/21 209 lb 9.6 oz (95.1 kg)  02/25/21 207 lb (93.9 kg)    General: Alert, oriented, no distress.  Skin: normal turgor, no rashes, warm and dry HEENT: Normocephalic, atraumatic. Pupils equal round and reactive to light; sclera anicteric; extraocular muscles intact;  Nose without nasal septal hypertrophy Mouth/Parynx benign; Mallinpatti scale 3 Neck: No JVD, no carotid bruits; normal carotid upstroke Lungs: clear to ausculatation and percussion; no wheezing or rales Chest wall: without tenderness to palpitation Heart: PMI not displaced, RRR, s1 s2 normal, 1/6 systolic murmur, no diastolic murmur, no rubs, gallops, thrills, or heaves Abdomen: soft, nontender; no hepatosplenomehaly, BS+; abdominal aorta nontender and not dilated by palpation. Back: no CVA tenderness Pulses 2+ Musculoskeletal: full range of motion, normal strength, no joint deformities Extremities: no clubbing cyanosis or edema, Homan's sign negative  Neurologic: grossly nonfocal; Cranial nerves grossly wnl Psychologic: Normal mood and affect   Studies/Labs Reviewed:   EKG:  EKG  is ordered today.  ECG (independently read by me):  NSR at 81; Biatrial enlargement, QS V1-2, normal intervals  Recent Labs: BMP Latest Ref Rng & Units 03/23/2021 03/04/2021 02/25/2021  Glucose 70 - 99 mg/dL 113(H) 124(H) 99  BUN 8 - 23 mg/dL 16 21 28(H)  Creatinine 0.61 - 1.24 mg/dL 1.04 1.23 1.58(H)  BUN/Creat Ratio 10 - 24 - 17 18  Sodium 135 - 145 mmol/L 139 141 144  Potassium 3.5 - 5.1 mmol/L 4.2 5.2 5.0  Chloride 98 - 111 mmol/L 106 103 100  CO2 22 - 32 mmol/L 26 19(L) 24  Calcium 8.9 - 10.3 mg/dL 9.8 10.0 10.3(H)     Hepatic Function Latest Ref Rng & Units 03/24/2021 10/05/2020 09/23/2019  Total Protein 6.0 - 8.5 g/dL 7.8 7.2 6.6  Albumin 3.8 - 4.8 g/dL 5.3(H) 4.8 3.9  AST 0 - 40 IU/L 32 25 30  ALT 0 - 44 IU/L 40 28 42  Alk Phosphatase 44 - 121 IU/L 109 112 75  Total Bilirubin 0.0 - 1.2 mg/dL 0.9 0.3 0.7  Bilirubin, Direct 0.00 - 0.40 mg/dL 0.29 0.12 -    CBC Latest Ref Rng & Units 02/06/2021 02/05/2021 02/04/2021  WBC 4.0 - 10.5 K/uL 10.7(H) 10.9(H) 9.7  Hemoglobin 13.0 - 17.0 g/dL 15.4 14.9 15.9  Hematocrit 39.0 - 52.0 % 47.0 43.8 48.3  Platelets 150 - 400 K/uL 329 304 295   Lab Results  Component Value Date   MCV 89.2 02/06/2021   MCV 88.8 02/05/2021   MCV 89.6 02/04/2021   Lab Results  Component Value Date   TSH 0.784 02/02/2021   Lab Results  Component Value Date   HGBA1C 6.4 (H) 02/02/2021     BNP    Component Value Date/Time   BNP 325.9 (H) 03/23/2021 1616    ProBNP No results found for: PROBNP   Lipid Panel     Component Value Date/Time   CHOL 221 (H) 02/02/2021 0552   CHOL 117 11/16/2020 0912   TRIG 89 02/02/2021 0552  HDL 53 02/02/2021 0552   HDL 37 (L) 11/16/2020 0912   CHOLHDL 4.2 02/02/2021 0552   VLDL 18 02/02/2021 0552   LDLCALC 150 (H) 02/02/2021 0552   LDLCALC 66 11/16/2020 0912   LABVLDL 14 11/16/2020 0912     RADIOLOGY: No results found.   Additional studies/ records that were reviewed today include:   Emergent cath :February 02, 2021 Prox RCA lesion is 20% stenosed. Prox LAD to Mid LAD lesion is 15% stenosed. 1st Diag lesion is 5% stenosed. Ost LAD to Prox LAD lesion is 20% stenosed. Dist LAD-1 lesion is 40% stenosed. Dist LAD-2 lesion is 80% stenosed. There is severe left ventricular systolic dysfunction. LV end diastolic pressure is severely elevated. The left ventricular ejection fraction is 25-35% by visual estimate.   Patent proximal LAD stent with mild 20% in-stent narrowing.  There is significant thrombus burden immediately after the stent extending into the diagonal vessel and mid LAD with TIMI-3 flow.  There is a 305 mid distal LAD stenosis and  distal 80% stenosis in a small caliber distal LAD.   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  The dominant RCA had improvement in the previous 70% stenosis which now appeared approximately 20%.  The RCA supplied the PDA and PLA vessel   Acute LV dysfunction with EF estimated at 25 to 30% with severe hypocontractility involving the mid distal anterolateral wall apex and apical inferior segment.  Elevated LVEDP at 29 mmHg.   RECOMMENDATION: Patient was started on Aggrastat in the catheterization laboratory and was bolused with Brilinta 180 mg orally.  He does not appear to have significant stenoses at the site of his thrombotic diagonal and LAD and plan continuation of Aggrastat for approximately 48 hours with restudy.  Of note, the patient had recently stopped his ticagrelor and a echo on December 14, 2020 had shown normalization of LV function without wall motion abnormalities.      02/04/2021: Repeat cath/PCI Ost LAD to Prox LAD lesion is 20% stenosed. Prox LAD to Mid LAD lesion is 40% stenosed. Dist LAD-1 lesion is 40% stenosed. Dist LAD-2 lesion is 80% stenosed. Prox RCA lesion is 20% stenosed. Post intervention, there is a 0% residual stenosis. 1st Diag lesion is 5%  stenosed.   Relook angiography of the left coronary system showed improvement in the previous thrombus burden in the diagonal and LAD distal stent and beyond but moderate residual thrombus was still present.   LVEDP 8 mmHg   Successful percutaneous coronary intervention of the LAD utilizing Pronto thrombectomy, intravascular ultrasound and ultimate PTCA of the distal stented segment and mid LAD with residual narrowing 0%.  Resolution of thrombus in the diagonal vessel with mild 5% narrowing.  Very mild thrombus distal stent in the region of the septal perforating artery.  There is brisk TIMI-3 flow.   RECOMMENDATION: DAPT indefinitely.  Would continue Aggrastat for additional 24 to 36 hours.  GDMT Medical therapy for his LV function/ischemic cardiomyopathy.      Intervention    ASSESSMENT:    1. Acute on chronic systolic heart failure ([place])   2. Ischemic cardiomyopathy   3. STEMI involving left anterior descending coronary artery (HCC)August 2020; June 2022   4. Hyperlipidemia with target LDL less than 70   5. Essential hypertension   6. Cardiac defibrillator in situ     PLAN:  Stephen Jennings is a 63 year old gentleman who has a history of COPD,  type 2 diabetes mellitus, CAD, and had suffered an initial myocardial infarction complicated by respiratory failure and cardiogenic shock in June 2020 at which time he underwent successful PCI to totally occluded LAD.  He required Impella support and initial echo showed EF at 20 to 25%.  His hospital course was complicated by torsade for which she underwent placement of an ICD prior to discharge.  His LV function subsequently normalized by April 2022 at which time his Kary Kos was discontinued.  Unfortunately 2 months later he presented with ACS secondary to extensive thrombus burden in the LAD for which she was treated with Aggrastat and on relook 2 days later underwent Pronto thrombectomy intravascular ultrasound and ultimate PTCA of the  distal stented segment and mid LAD with residual narrowing of 0%.  Subsequently, he has been without anginal symptomatology.  He has been followed by pharmacy and advanced heart failure clinic.  At his most recent evaluation with Dr. Haroldine Laws his Delene Loll has been titrated to 49/51 mg and he also is on furosemide now to be taken on Monday Wednesday and Friday, as well as Jardiance 10 mg daily.  The he is only on low-dose rosuvastatin at 20 mg with Zetia 10 mg for lipid management.  In June 2022, LDL cholesterol was significantly increased at 150 with total cholesterol 221.  I have suggested he increase rosuvastatin to 40 mg continue with Zetia 10 mg with target LDL in the 50s or below if at all possible.  A BMP obtained yesterday was 325 on his current therapy.  I will recheck LFTs on his statin therapy since these have not been checked recently.  In 3 months I have recommended a comprehensive metabolic panel lipid panel.  He will also be following up with the advanced heart failure clinic.  I will see him in 4 months for reevaluation.   Medication Adjustments/Labs and Tests Ordered: Current medicines are reviewed at length with the patient today.  Concerns regarding medicines are outlined above.  Medication changes, Labs and Tests ordered today are listed in the Patient Instructions below. Patient Instructions  Medication Instructions:  INCREASE rosuvastatin to 64m daily. Per Dr. KClaiborne Billingsyou may take two of your 228mtablets daily until you are out, then you may pick up your 4079mablets and take 1 tablet daily.   *If you need a refill on your cardiac medications before your next appointment, please call your pharmacy*   Lab Work: LFT today, FASTING lipid and Cmet in 3 months.   If you have labs (blood work) drawn today and your tests are completely normal, you will receive your results only by: MyCPoipuf you have MyChart) OR A paper copy in the mail If you have any lab test that is  abnormal or we need to change your treatment, we will call you to review the results.   Testing/Procedures: None ordered.    Follow-Up: [facility] and your health needs are our priority.  As part of our continuing mission to provide you with exceptional heart care, we have created designated Provider Care Teams.  These Care Teams include your primary Cardiologist (physician) and Advanced Practice Providers (APPs -  Physician Assistants and Nurse Practitioners) who all work together to provide you with the care you need, when you need it.  We recommend signing up for the patient portal called "MyChart".  Sign up information is provided on this After Visit Summary.  MyChart is used to connect with patients for Virtual Visits (Telemedicine).  Patients are able to view lab/test results, encounter notes, upcoming appointments, etc.  Non-urgent messages can be sent to your provider as well.   To learn more about what you can do with MyChart, go to [email or web address].    Your next appointment:   4 month(s)  The format for your next appointment:   In Person  Provider:   Shelva Majestic, MD      Signed, Shelva Majestic, MD  03/31/2021 7:09 PM    [place] [address] [place], [place],   [address] Phone: [phone]

## 2021-03-25 LAB — HEPATIC FUNCTION PANEL
ALT: 40 IU/L (ref 0–44)
AST: 32 IU/L (ref 0–40)
Albumin: 5.3 g/dL — ABNORMAL HIGH (ref 3.8–4.8)
Alkaline Phosphatase: 109 IU/L (ref 44–121)
Bilirubin Total: 0.9 mg/dL (ref 0.0–1.2)
Bilirubin, Direct: 0.29 mg/dL (ref 0.00–0.40)
Total Protein: 7.8 g/dL (ref 6.0–8.5)

## 2021-03-31 ENCOUNTER — Encounter: Payer: Self-pay | Admitting: Cardiovascular Disease

## 2021-04-05 NOTE — Progress Notes (Signed)
Advanced Heart Failure Clinic Note   PCP: Ladell Pier, MD PCP-Cardiologist: Shelva Majestic, MD  HF Cardiologist: Dr. Haroldine Laws  HPI: Stephen Jennings is a 65 y.o. 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   Brought to ER 03/31/19 with respiratory failure/cardiac arrest. Intubated in ER. Initial ECG with anterior ST elevation. Cath by Dr. Claiborne Billings with 3v CAD and totally occluded prox LAD. LCX anomalous off RCA. Underwent PCI of LAD which was diffusely diseased vessel. Developed cardiogenic shock requiring pressors and mechanical support with impella.  03/31/2019 Echo 20-25%. Hospital course complicated by Torsades. EP consulted placed ICD prior to discharge.    Echo 2/21  EF 45-50%   Echo 4/22 EF 55-60%    He stopped Brillinta in 4/22. Admitted in 6/22 with NSTEMI. Cath with patent proximal LAD stent with mild 20% in-stent narrowing.  There is significant thrombus burden immediately after the stent extending into the diagonal vessel and mid LAD with TIMI-3 flow. Treated with Aggrastat x 48 hours. ECHO 02/02/21 EF 35-40% Relook angiography of the left coronary system showed improvement in the previous thrombus burden in the diagonal and LAD distal stent and beyond but moderate residual thrombus was still present. Underwent successful percutaneous coronary intervention of the LAD utilizing Pronto thrombectomy, intravascular ultrasound and ultimate PTCA of the distal stented segment and mid LAD with residual narrowing 0%. Recommended indefinite DAPT.   Today he returns for HF follow up. Last visit volume was up in setting of not taking lasix or Entresto. Entresto increased and lasix added for a couple of days. Now he is feeling worse, SOB with going to the bathroom. Poor energy. Denies CP, dizziness, edema, or PND/Orthopnea. Appetite ok. No fever or chills. Weight at home 208 pounds. Taking all medications. Waiting to start Cardiac Rehab. Eats out 2-3x/week. Drinks>2L fluid/day.  Working FT doing Biomedical scientist. Taking his Brilinta with caffeine daily.  Cath: 02/02/21 Prox RCA lesion is 20% stenosed. Prox LAD to Mid LAD lesion is 15% stenosed. 1st Diag lesion is 5% stenosed. Ost LAD to Prox LAD lesion is 20% stenosed. Dist LAD-1 lesion is 40% stenosed. Dist LAD-2 lesion is 80% stenosed. There is severe left ventricular systolic dysfunction. LV end diastolic pressure is severely elevated. The left ventricular ejection fraction is 25-35% by visual estimate.  Past Medical History:  Diagnosis Date   Cardiogenic shock (HCC)    CHF (congestive heart failure) (HCC)    Coronary artery disease    Diabetes mellitus without complication ([place])    Hyperlipidemia    Medical history non-contributory    Current Outpatient Medications  Medication Sig Dispense Refill   Accu-Chek Softclix Lancets lancets Use to measure blood sugar twice a day 100 each 6   albuterol (ACCUNEB) 0.63 MG/3ML nebulizer solution Take 3 mLs (0.63 mg total) by nebulization every 6 (six) hours as needed for wheezing. 75 mL 12   albuterol (VENTOLIN HFA) 108 (90 Base) MCG/ACT inhaler Inhale 2 puffs into the lungs every 6 (six) hours as needed for wheezing or shortness of breath. 18 g 2   aspirin EC 81 MG tablet Take 1 tablet (81 mg total) by mouth daily. Swallow whole. 150 tablet 2   Blood Glucose Monitoring Suppl (ACCU-CHEK GUIDE ME) w/Device KIT 1 kit by Does not apply route in the morning and at bedtime. Use to measure blood sugar twice a day 1 kit 0   empagliflozin (JARDIANCE) 10 MG TABS tablet Take 1 tablet (10 mg total) by mouth  daily before breakfast. 90 tablet 3   ezetimibe (ZETIA) 10 MG tablet Take 1 tablet (10 mg total) by mouth daily. 90 tablet 3   fluticasone (FLONASE) 50 MCG/ACT nasal spray Place 1 spray into both nostrils daily as needed for allergies or rhinitis. 16 g 2   furosemide (LASIX) 40 MG tablet Take 1 tablet every Monday Wednesday Friday 30 tablet 5   glucose blood (ACCU-CHEK GUIDE)  test strip Use to measure blood sugar twice a day 100 each 6   Hyprom-Naphaz-Polysorb-Zn Sulf (CLEAR EYES COMPLETE) SOLN Apply 1 drop to eye 3 (three) times daily. 15 mL 1   loratadine (CLARITIN) 10 MG tablet Take 1 tablet (10 mg total) by mouth daily. 30 tablet 11   metoprolol succinate (TOPROL-XL) 50 MG 24 hr tablet Take 1 tablet (50 mg total) by mouth daily. 30 tablet 3   montelukast (SINGULAIR) 10 MG tablet Take 1 tablet (10 mg total) by mouth at bedtime. 30 tablet 3   nitroGLYCERIN (NITROSTAT) 0.4 MG SL tablet Place 1 tablet (0.4 mg total) under the tongue every 5 (five) minutes x 3 doses as needed for chest pain. 25 tablet 3   omeprazole (PRILOSEC) 20 MG capsule Take 1 capsule (20 mg total) by mouth daily. 30 capsule 3   potassium chloride SA (KLOR-CON) 20 MEQ tablet Take 1 tablet (20 mEq total) by mouth as needed. Take when you take a dose of Lasix 30 tablet 5   rosuvastatin (CRESTOR) 40 MG tablet Take 1 tablet (40 mg total) by mouth daily. 90 tablet 3   sacubitril-valsartan (ENTRESTO) 49-51 MG Take 1 tablet by mouth 2 (two) times daily. 60 tablet 5   ticagrelor (BRILINTA) 90 MG TABS tablet Take 1 tablet (90 mg total) by mouth 2 (two) times daily. 180 tablet 3   tiotropium (SPIRIVA HANDIHALER) 18 MCG inhalation capsule Place 1 capsule (18 mcg total) into inhaler and inhale daily. 30 capsule 2   traZODone (DESYREL) 50 MG tablet TAKE 1/2 TO 1 (ONE-HALF TO ONE) TABLET BY MOUTH EVERY DAY AT BEDTIME 30 tablet 0   No current facility-administered medications for this encounter.   Allergies  Allergen Reactions   Atorvastatin     Muscle weakness and fatigue on 14m daily   Spironolactone     gynecomastia   Metformin And Related Anxiety and Palpitations   Other Anxiety and Palpitations   Social History   Socioeconomic History   Marital status: Married    Spouse name: Not on file   Number of children: 1   Years of education: 8   Highest education level: 8th grade  Occupational History    Not on file  Tobacco Use   Smoking status: Former    Types: Cigarettes    Quit date: 04/08/2019    Years since quitting: 1.9   Smokeless tobacco: Never  Vaping Use   Vaping Use: Never used  Substance and Sexual Activity   Alcohol use: No   Drug use: No   Sexual activity: Not Currently  Other Topics Concern   Not on file  Social History Narrative   Lives with wife   Right handed   Caffeine: about 2 cups of coffee every morning, no soda   Social Determinants of Health   Financial Resource Strain: Not on file  Food Insecurity: Not on file  Transportation Needs: Not on file  Physical Activity: Not on file  Stress: Not on file  Social Connections: Not on file  Intimate Partner Violence: Not on file  Family History  Problem Relation Age of Onset   Hypertension Mother    Heart failure Mother    Headache Neg Hx    Migraines Neg Hx    BP (!) 144/70   Pulse 77   Wt 94.5 kg (208 lb 6.4 oz)   SpO2 97%   BMI 31.69 kg/m   Wt Readings from Last 3 Encounters:  04/06/21 94.5 kg (208 lb 6.4 oz)  03/24/21 92 kg (202 lb 12.8 oz)  03/23/21 95.1 kg (209 lb 9.6 oz)   PHYSICAL EXAM: General:  NAD. No resp difficulty HEENT: Normal Neck: Supple. JVP 6-7. Carotids 2+ bilat; no bruits. No lymphadenopathy or thryomegaly appreciated. Cor: PMI nondisplaced. Regular rate & rhythm. No rubs, gallops or murmurs. Lungs: Wheezes throughout all lung fields Abdomen: Soft, nontender, nondistended. No hepatosplenomegaly. No bruits or masses. Good bowel sounds. Extremities: No cyanosis, clubbing, rash, trace LE edema Neuro: Alert & oriented x 3, cranial nerves grossly intact. Moves all 4 extremities w/o difficulty. Affect pleasant.  Device Interrogation: Appears he starting accumulating fluid ~04/04/21, daily activity ~8 hours, no AF/AT/VF (personally reviewed).  ECG: NSR 68 bpm (personally reviewed).  ASSESSMENT & PLAN:  1. Chronic systolic HF - Echo 5/80 DX83-38% - Echo 09/23/19: EF  45-50% - Echo 4/22 EF 55-60%  - NSTEMI in 6/22 -> Echo 6/22 EF 35-40% - Worsening NYHA IIIb, although he appears to have COPD exacerbation. He is mildly volume overloaded and elevated BP. - Increase Entresto to 97/103 mg bid. - Change lasix to 40 mg daily (previously on MWF). - Stop Toprol. Start bisoprolol 5 mg daily. - Continue Jardiance 10 mg daily. - Suspect EF will improve as he is farther out from recent NSTEMI. - BMET & BNP today; repeat BMET in 10 days.  2. Cardiac arrest/VT/torsades 03/1999 - Now s/p ICD. - ICD interrogated personally in clinic. No VT/AF.   3. CAD  - s/p acute anterior MI 8/20 with PCI/DES to LAD. Diffusely diseased vessel - + residual CAD in RCA and anomalous LCX - NSTEMI in 6/22 after stopping Brillinta. Stent patent but clot after stent -> s/p thrombectomy and POBA - Continue DAPT and statin indefinitely. - No s/s ischemia currently (do not think HF driven by ischemia) - Continue Jardiance 10 mg daily. - Waiting to start Cardiac Rehab.   4. DM2 - On metformin & Jardiance.    5. COPD w/ exacerbation - Prominent wheezes on exam today or worsening dyspnea.  - Will obtain CXR. - Continue Symbicort + nebs. - I will refill his albuterol inhaler.  - Start prednisone 40 mg daily x 5 days. Instructed to watch salt and fluid as he starts steroid. - Needs to see PCP soon. - Quit smoking 8/20.  6. HTN - Elevated - Increase Entresto.  Follow up in 3 weeks with APP.  [place], [name] 04/06/21

## 2021-04-06 ENCOUNTER — Telehealth (HOSPITAL_COMMUNITY): Payer: Self-pay | Admitting: Pharmacy Technician

## 2021-04-06 ENCOUNTER — Ambulatory Visit (HOSPITAL_COMMUNITY)
Admission: RE | Admit: 2021-04-06 | Discharge: 2021-04-06 | Disposition: A | Discharge status: Home / Self Care | Payer: Medicaid Other | Source: Ambulatory Visit | Attending: Family Medicine | Admitting: Family Medicine

## 2021-04-06 ENCOUNTER — Encounter (HOSPITAL_COMMUNITY): Payer: Self-pay

## 2021-04-06 ENCOUNTER — Other Ambulatory Visit: Payer: Self-pay

## 2021-04-06 ENCOUNTER — Other Ambulatory Visit (HOSPITAL_COMMUNITY): Payer: Self-pay

## 2021-04-06 VITALS — BP 144/70 | HR 77 | Wt 208.4 lb

## 2021-04-06 DIAGNOSIS — Z8674 Personal history of sudden cardiac arrest: Secondary | ICD-10-CM | POA: Insufficient documentation

## 2021-04-06 DIAGNOSIS — I110 Hypertensive heart disease with heart failure: Secondary | ICD-10-CM | POA: Insufficient documentation

## 2021-04-06 DIAGNOSIS — E119 Type 2 diabetes mellitus without complications: Secondary | ICD-10-CM | POA: Diagnosis not present

## 2021-04-06 DIAGNOSIS — Z7982 Long term (current) use of aspirin: Secondary | ICD-10-CM | POA: Diagnosis not present

## 2021-04-06 DIAGNOSIS — Z79899 Other long term (current) drug therapy: Secondary | ICD-10-CM | POA: Insufficient documentation

## 2021-04-06 DIAGNOSIS — I2510 Atherosclerotic heart disease of native coronary artery without angina pectoris: Secondary | ICD-10-CM | POA: Insufficient documentation

## 2021-04-06 DIAGNOSIS — I5022 Chronic systolic (congestive) heart failure: Secondary | ICD-10-CM | POA: Diagnosis not present

## 2021-04-06 DIAGNOSIS — Z09 Encounter for follow-up examination after completed treatment for conditions other than malignant neoplasm: Secondary | ICD-10-CM | POA: Insufficient documentation

## 2021-04-06 DIAGNOSIS — R0602 Shortness of breath: Secondary | ICD-10-CM

## 2021-04-06 DIAGNOSIS — I10 Essential (primary) hypertension: Secondary | ICD-10-CM | POA: Diagnosis not present

## 2021-04-06 DIAGNOSIS — Z8249 Family history of ischemic heart disease and other diseases of the circulatory system: Secondary | ICD-10-CM | POA: Diagnosis not present

## 2021-04-06 DIAGNOSIS — J439 Emphysema, unspecified: Secondary | ICD-10-CM | POA: Diagnosis not present

## 2021-04-06 DIAGNOSIS — Z87891 Personal history of nicotine dependence: Secondary | ICD-10-CM | POA: Insufficient documentation

## 2021-04-06 DIAGNOSIS — Z955 Presence of coronary angioplasty implant and graft: Secondary | ICD-10-CM | POA: Insufficient documentation

## 2021-04-06 DIAGNOSIS — J441 Chronic obstructive pulmonary disease with (acute) exacerbation: Secondary | ICD-10-CM | POA: Diagnosis not present

## 2021-04-06 DIAGNOSIS — I252 Old myocardial infarction: Secondary | ICD-10-CM | POA: Diagnosis not present

## 2021-04-06 DIAGNOSIS — Z7984 Long term (current) use of oral hypoglycemic drugs: Secondary | ICD-10-CM | POA: Insufficient documentation

## 2021-04-06 DIAGNOSIS — Z95810 Presence of automatic (implantable) cardiac defibrillator: Secondary | ICD-10-CM | POA: Insufficient documentation

## 2021-04-06 LAB — BASIC METABOLIC PANEL
Anion gap: 8 (ref 5–15)
BUN: 14 mg/dL (ref 8–23)
CO2: 28 mmol/L (ref 22–32)
Calcium: 9.4 mg/dL (ref 8.9–10.3)
Chloride: 101 mmol/L (ref 98–111)
Creatinine, Ser: 1.24 mg/dL (ref 0.61–1.24)
GFR, Estimated: >60 mL/min (ref >60)
Glucose, Bld: 137 mg/dL — ABNORMAL HIGH (ref 70–99)
Potassium: 4.2 mmol/L (ref 3.5–5.1)
Sodium: 137 mmol/L (ref 135–145)

## 2021-04-06 LAB — BRAIN NATRIURETIC PEPTIDE: B Natriuretic Peptide: 275.5 pg/mL — ABNORMAL HIGH (ref 0.0–100.0)

## 2021-04-06 MED ORDER — BISOPROLOL FUMARATE 5 MG PO TABS: 5.0000 mg | ORAL_TABLET | Freq: Every day | ORAL | 4 refills | Status: DC

## 2021-04-06 MED ORDER — FUROSEMIDE 40 MG PO TABS: ORAL_TABLET | ORAL | 5 refills | Status: DC

## 2021-04-06 MED ORDER — ENTRESTO 97-103 MG PO TABS: 1.0000 | ORAL_TABLET | Freq: Two times a day (BID) | ORAL | 4 refills | Status: DC

## 2021-04-06 MED ORDER — PREDNISONE 20 MG PO TABS: 40.0000 mg | ORAL_TABLET | Freq: Every day | ORAL | 0 refills | Status: AC

## 2021-04-06 NOTE — Telephone Encounter (Signed)
Advanced Heart Failure Patient Advocate Encounter  Prior Authorization for Bisoprolol '5mg'$  has been approved.    PA# [id number] Effective dates: 04/06/21 through 04/06/22  Charlann Boxer, CPhT

## 2021-04-06 NOTE — Patient Instructions (Addendum)
EKG was performed today.  Labs were performed today, if labs are abnormal the clinic will call you.  START Prednisone 5 mg ( 1 tablet) daily for 5 days  INCREASE Entresto 97/103 Mg (1 tablet) twice a day  CHANGE Lasix to 40 mg (1 tablet) daily  STOP Toprol XL  START Bisoprolol 5 mg Daily  Monitor Blood pressure at home and log your blood pressure number daily  CALL your primary care Doctor to follow up with your COPD  Your physician recommends that you schedule a follow-up appointment in: Tuesday September 6th, 2022 at 8:30 am  Lab appointment on Friday August 26th, 2022   milAt the [facility], you and your health needs are our priority. As part of our continuing mission to provide you with exceptional heart care, we have created designated Provider Care Teams. These Care Teams include your primary Cardiologist (physician) and Advanced Practice Providers (APPs- Physician Assistants and Nurse Practitioners) who all work together to provide you with the care you need, when you need it.   You may see any of the following providers on your designated Care Team at your next follow up: Dr [name] Dr Loralie Champagne Dr Patrice Paradise, NP Lyda Jester, [place] Ginnie Smart Audry Riles, PharmD   Please be sure to bring in all your medications bottles to every appointment.    If you have any questions or concerns before your next appointment please send [place] a message through [place] or call our office at [phone].    TO LEAVE A MESSAGE FOR THE NURSE SELECT OPTION 2, PLEASE LEAVE A MESSAGE INCLUDING: YOUR NAME DATE OF BIRTH CALL BACK NUMBER REASON FOR CALL**this is important as we prioritize the call backs  YOU WILL RECEIVE A CALL BACK THE SAME DAY AS LONG AS YOU CALL BEFORE 4:00 PM

## 2021-04-08 ENCOUNTER — Telehealth (HOSPITAL_COMMUNITY): Payer: Self-pay | Admitting: Cardiology

## 2021-04-08 NOTE — Telephone Encounter (Signed)
[phone] (M) attempted to contact patient. No answer unable to leave message VM full. Letter mailed      [place], [place]  04/06/2021  2:55 PM EDT     No pneumonia but does show changes consistent with emphysema. Needs to follow up with PCP concerning inhalers.

## 2021-04-09 ENCOUNTER — Telehealth (HOSPITAL_COMMUNITY): Payer: Self-pay

## 2021-04-09 NOTE — Telephone Encounter (Signed)
Pt insurance is active and benefits verified through [facility] Co-pay $3.00, DED 0/0 met, out of pocket 0/0 met, co-insurance 0%. no pre-authorization required. [place]/BCBS 02/10/2021_0 :45pm, REF# [id number]

## 2021-04-16 ENCOUNTER — Other Ambulatory Visit: Payer: Self-pay

## 2021-04-16 ENCOUNTER — Ambulatory Visit (HOSPITAL_COMMUNITY)
Admission: RE | Admit: 2021-04-16 | Discharge: 2021-04-16 | Disposition: A | Discharge status: Home / Self Care | Payer: Medicaid Other | Source: Ambulatory Visit | Attending: Cardiology | Admitting: Cardiology

## 2021-04-16 DIAGNOSIS — I5022 Chronic systolic (congestive) heart failure: Secondary | ICD-10-CM | POA: Diagnosis not present

## 2021-04-16 LAB — BASIC METABOLIC PANEL
Anion gap: 10 (ref 5–15)
BUN: 23 mg/dL (ref 8–23)
CO2: 30 mmol/L (ref 22–32)
Calcium: 9 mg/dL (ref 8.9–10.3)
Chloride: 97 mmol/L — ABNORMAL LOW (ref 98–111)
Creatinine, Ser: 1.39 mg/dL — ABNORMAL HIGH (ref 0.61–1.24)
GFR, Estimated: 57 mL/min — ABNORMAL LOW (ref >60)
Glucose, Bld: 134 mg/dL — ABNORMAL HIGH (ref 70–99)
Potassium: 3.7 mmol/L (ref 3.5–5.1)
Sodium: 137 mmol/L (ref 135–145)

## 2021-04-21 ENCOUNTER — Ambulatory Visit (INDEPENDENT_AMBULATORY_CARE_PROVIDER_SITE_OTHER): Payer: Medicaid Other

## 2021-04-21 DIAGNOSIS — I255 Ischemic cardiomyopathy: Secondary | ICD-10-CM

## 2021-04-21 LAB — CUP PACEART REMOTE DEVICE CHECK
Battery Remaining Longevity: 79 mo
Battery Remaining Percentage: 80 %
Battery Voltage: 2.99 V
Brady Statistic AP VP Percent: 1 %
Brady Statistic AP VS Percent: 2.5 %
Brady Statistic AS VP Percent: 1 %
Brady Statistic AS VS Percent: 97 %
Brady Statistic RA Percent Paced: 2.3 %
Brady Statistic RV Percent Paced: 1 %
Date Time Interrogation Session: 20220831040018
HighPow Impedance: 81 Ohm
HighPow Impedance: 81 Ohm
Implantable Lead Implant Date: 20200824
Implantable Lead Implant Date: 20200824
Implantable Lead Location: 753859
Implantable Lead Location: 753860
Implantable Pulse Generator Implant Date: 20200824
Lead Channel Impedance Value: 340 Ohm
Lead Channel Impedance Value: 390 Ohm
Lead Channel Pacing Threshold Amplitude: 0.75 V
Lead Channel Pacing Threshold Amplitude: 0.75 V
Lead Channel Pacing Threshold Pulse Width: 0.5 ms
Lead Channel Pacing Threshold Pulse Width: 0.5 ms
Lead Channel Sensing Intrinsic Amplitude: 1.7 mV
Lead Channel Sensing Intrinsic Amplitude: 11.9 mV
Lead Channel Setting Pacing Amplitude: 2 V
Lead Channel Setting Pacing Amplitude: 2.5 V
Lead Channel Setting Pacing Pulse Width: 0.5 ms
Lead Channel Setting Sensing Sensitivity: 0.5 mV
Pulse Gen Serial Number: 1300954

## 2021-04-23 ENCOUNTER — Ambulatory Visit: Payer: Medicaid Other | Attending: Internal Medicine | Admitting: Internal Medicine

## 2021-04-23 ENCOUNTER — Encounter: Payer: Self-pay | Admitting: Internal Medicine

## 2021-04-23 ENCOUNTER — Other Ambulatory Visit: Payer: Self-pay

## 2021-04-23 VITALS — BP 118/80 | HR 75 | Resp 16 | Wt 207.8 lb

## 2021-04-23 DIAGNOSIS — Z79899 Other long term (current) drug therapy: Secondary | ICD-10-CM | POA: Diagnosis not present

## 2021-04-23 DIAGNOSIS — I110 Hypertensive heart disease with heart failure: Secondary | ICD-10-CM | POA: Insufficient documentation

## 2021-04-23 DIAGNOSIS — I10 Essential (primary) hypertension: Secondary | ICD-10-CM

## 2021-04-23 DIAGNOSIS — Z87891 Personal history of nicotine dependence: Secondary | ICD-10-CM | POA: Insufficient documentation

## 2021-04-23 DIAGNOSIS — N289 Disorder of kidney and ureter, unspecified: Secondary | ICD-10-CM | POA: Diagnosis not present

## 2021-04-23 DIAGNOSIS — Z23 Encounter for immunization: Secondary | ICD-10-CM

## 2021-04-23 DIAGNOSIS — I25118 Atherosclerotic heart disease of native coronary artery with other forms of angina pectoris: Secondary | ICD-10-CM | POA: Insufficient documentation

## 2021-04-23 DIAGNOSIS — Z6831 Body mass index (BMI) 31.0-31.9, adult: Secondary | ICD-10-CM

## 2021-04-23 DIAGNOSIS — J439 Emphysema, unspecified: Secondary | ICD-10-CM | POA: Diagnosis not present

## 2021-04-23 DIAGNOSIS — I2510 Atherosclerotic heart disease of native coronary artery without angina pectoris: Secondary | ICD-10-CM

## 2021-04-23 DIAGNOSIS — Z713 Dietary counseling and surveillance: Secondary | ICD-10-CM | POA: Insufficient documentation

## 2021-04-23 DIAGNOSIS — Z8249 Family history of ischemic heart disease and other diseases of the circulatory system: Secondary | ICD-10-CM | POA: Insufficient documentation

## 2021-04-23 DIAGNOSIS — Z7982 Long term (current) use of aspirin: Secondary | ICD-10-CM | POA: Insufficient documentation

## 2021-04-23 DIAGNOSIS — Z955 Presence of coronary angioplasty implant and graft: Secondary | ICD-10-CM | POA: Insufficient documentation

## 2021-04-23 DIAGNOSIS — E1159 Type 2 diabetes mellitus with other circulatory complications: Secondary | ICD-10-CM | POA: Diagnosis not present

## 2021-04-23 DIAGNOSIS — E669 Obesity, unspecified: Secondary | ICD-10-CM | POA: Diagnosis not present

## 2021-04-23 MED ORDER — MOMETASONE FURO-FORMOTEROL FUM 100-5 MCG/ACT IN AERO: 2.0000 | INHALATION_SPRAY | Freq: Two times a day (BID) | RESPIRATORY_TRACT | 6 refills | Status: DC

## 2021-04-23 MED ORDER — ALBUTEROL SULFATE HFA 108 (90 BASE) MCG/ACT IN AERS: 2.0000 | INHALATION_SPRAY | Freq: Four times a day (QID) | RESPIRATORY_TRACT | 6 refills | Status: DC | PRN

## 2021-04-23 MED ORDER — SPIRIVA HANDIHALER 18 MCG IN CAPS: 18.0000 ug | ORAL_CAPSULE | Freq: Every day | RESPIRATORY_TRACT | 6 refills | Status: DC

## 2021-04-23 NOTE — Progress Notes (Signed)
Patient ID: Stephen Jennings, male    DOB: Nov 24, 1955  MRN: [id number]  CC: Follow-up (Patient has no question or concern. Patient has requested a nebulizer and has card.)   Subjective: Stephen Jennings is a 65 y.o. male who presents for chronic ds management His concerns today include:  Patient with history of CAD(NSTEMI with stent to LAD 03/2019), ICM with last EF 35-40% 01/2021, former smoker, COPD, pre-DM 2, anemia (resolved on labs 10/21).   Since last visit with me, patient was hospitalized in June of this year with non-ST elevation MI requiring PCI and thrombectomy to the LAD.  He was discharged on DAPT, statin therapy, Zetia, Jardiance, metoprolol.  He has since followed up with cardiology.  Metoprolol was changed to bisoprolol and Entresto was started. -Patient reports compliance with medications but did not take medicines as yet for today.  He has not had any chest pains.  He endorses shortness of breath with ambulation. He checks his blood pressure at home.  He gives readings of 109/67, 120/62. -Creatinine has fluctuated since hospitalization.  Most recent creatinine is 1.39 with GFR of 57.  He is not on any NSAIDs.  Most recent A1c was 6.4.  He is on [place].  Reports eating habits have been poor and he has been overeating.  He thinks one of his medications has increased his appetite.  He declines referral to a nutritionist. -Reports having eye exam in June of this year.  No diabetic changes seen.  COPD: Patient quit smoking 3 to 4 years ago.  Chest x-ray showed emphysematous changes.  He endorses shortness of breath and wheezing.  Some cough at times.  He is out of Spiriva and albuterol.  He has albuterol nebulizer treatments at home but does not have a nebulizer machine.  He states prescription was supposed to be sent to [facility] by our physician assistant who saw him back in April.  Patient Active Problem List   Diagnosis Date Noted   Hyperlipidemia 02/06/2021   Elevated troponin     NSTEMI (non-ST elevated myocardial infarction) ([place]) 02/01/2021   COPD with acute exacerbation ([place]) 01/20/2021   Non-seasonal allergic rhinitis 01/20/2021   Pulmonary emphysema ([place]) 12/03/2020   SOB (shortness of breath) 12/02/2020   Upper respiratory tract infection 12/02/2020   History of tobacco abuse 99/83/3825   Chronic systolic heart failure ([place]) 10/05/2020   Coronary artery disease of native artery of native heart with stable angina pectoris ([place]) 10/05/2020   Gastroesophageal reflux disease without esophagitis 10/05/2020   Prediabetes 10/05/2020   Essential hypertension 06/08/2020   Obesity (BMI 30.0-34.9) 06/08/2020   Snoring 11/28/2019   Morning headache 11/28/2019   Chronic daily headache 10/08/2019   Coronary artery disease involving left main coronary artery 05/08/2019   History of placement of stent in LAD coronary artery 05/08/2019     Current Outpatient Medications on File Prior to Visit  Medication Sig Dispense Refill   Accu-Chek Softclix Lancets lancets Use to measure blood sugar twice a day 100 each 6   albuterol (ACCUNEB) 0.63 MG/3ML nebulizer solution Take 3 mLs (0.63 mg total) by nebulization every 6 (six) hours as needed for wheezing. 75 mL 12   aspirin EC 81 MG tablet Take 1 tablet (81 mg total) by mouth daily. Swallow whole. 150 tablet 2   bisoprolol (ZEBETA) 5 MG tablet Take 1 tablet (5 mg total) by mouth daily. 30 tablet 4   Blood Glucose Monitoring Suppl (ACCU-CHEK GUIDE ME) w/Device KIT 1 kit by Does  not apply route in the morning and at bedtime. Use to measure blood sugar twice a day 1 kit 0   empagliflozin (JARDIANCE) 10 MG TABS tablet Take 1 tablet (10 mg total) by mouth daily before breakfast. 90 tablet 3   ezetimibe (ZETIA) 10 MG tablet Take 1 tablet (10 mg total) by mouth daily. 90 tablet 3   fluticasone (FLONASE) 50 MCG/ACT nasal spray Place 1 spray into both nostrils daily as needed for allergies or rhinitis. 16 g 2   furosemide (LASIX) 40 MG  tablet Take 1 tablet 40 mg Daily 30 tablet 5   glucose blood (ACCU-CHEK GUIDE) test strip Use to measure blood sugar twice a day 100 each 6   Hyprom-Naphaz-Polysorb-Zn Sulf (CLEAR EYES COMPLETE) SOLN Apply 1 drop to eye 3 (three) times daily. 15 mL 1   loratadine (CLARITIN) 10 MG tablet Take 1 tablet (10 mg total) by mouth daily. 30 tablet 11   montelukast (SINGULAIR) 10 MG tablet Take 1 tablet (10 mg total) by mouth at bedtime. 30 tablet 3   nitroGLYCERIN (NITROSTAT) 0.4 MG SL tablet Place 1 tablet (0.4 mg total) under the tongue every 5 (five) minutes x 3 doses as needed for chest pain. 25 tablet 3   omeprazole (PRILOSEC) 20 MG capsule Take 1 capsule (20 mg total) by mouth daily. 30 capsule 3   potassium chloride SA (KLOR-CON) 20 MEQ tablet Take 1 tablet (20 mEq total) by mouth as needed. Take when you take a dose of Lasix 30 tablet 5   rosuvastatin (CRESTOR) 40 MG tablet Take 1 tablet (40 mg total) by mouth daily. 90 tablet 3   sacubitril-valsartan (ENTRESTO) 97-103 MG Take 1 tablet by mouth 2 (two) times daily. 60 tablet 4   ticagrelor (BRILINTA) 90 MG TABS tablet Take 1 tablet (90 mg total) by mouth 2 (two) times daily. 180 tablet 3   traZODone (DESYREL) 50 MG tablet TAKE 1/2 TO 1 (ONE-HALF TO ONE) TABLET BY MOUTH EVERY DAY AT BEDTIME 30 tablet 0   No current facility-administered medications on file prior to visit.    Allergies  Allergen Reactions   Atorvastatin     Muscle weakness and fatigue on 79m daily   Spironolactone     gynecomastia   Metformin And Related Anxiety and Palpitations   Other Anxiety and Palpitations    Social History   Socioeconomic History   Marital status: Married    Spouse name: Not on file   Number of children: 1   Years of education: 8   Highest education level: 8th grade  Occupational History   Not on file  Tobacco Use   Smoking status: Former    Types: Cigarettes    Quit date: 04/08/2019    Years since quitting: 2.0   Smokeless tobacco: Never   Vaping Use   Vaping Use: Never used  Substance and Sexual Activity   Alcohol use: No   Drug use: No   Sexual activity: Not Currently  Other Topics Concern   Not on file  Social History Narrative   Lives with wife   Right handed   Caffeine: about 2 cups of coffee every morning, no soda   Social Determinants of Health   Financial Resource Strain: Not on file  Food Insecurity: Not on file  Transportation Needs: Not on file  Physical Activity: Not on file  Stress: Not on file  Social Connections: Not on file  Intimate Partner Violence: Not on file    Family History  Problem Relation Age of Onset   Hypertension Mother    Heart failure Mother    Headache Neg Hx    Migraines Neg Hx     Past Surgical History:  Procedure Laterality Date   CARDIAC CATHETERIZATION     CORONARY BALLOON ANGIOPLASTY N/A 02/04/2021   Procedure: CORONARY BALLOON ANGIOPLASTY;  Surgeon: Troy Sine, MD;  Location: [facility];  Service: Cardiovascular;  Laterality: N/A;   CORONARY THROMBECTOMY N/A 02/04/2021   Procedure: Coronary Thrombectomy;  Surgeon: Troy Sine, MD;  Location: [facility];  Service: Cardiovascular;  Laterality: N/A;   CORONARY/GRAFT ACUTE MI REVASCULARIZATION N/A 03/31/2019   Procedure: Coronary/Graft Acute MI Revascularization;  Surgeon: Troy Sine, MD;  Location: [facility];  Service: Cardiovascular;  Laterality: N/A;   ICD IMPLANT N/A 04/15/2019   Procedure: ICD IMPLANT;  Surgeon: Constance Haw, MD;  Location: [facility];  Service: Cardiovascular;  Laterality: N/A;   INTRAVASCULAR ULTRASOUND/IVUS N/A 02/04/2021   Procedure: Intravascular Ultrasound/IVUS;  Surgeon: Troy Sine, MD;  Location: [facility];  Service: Cardiovascular;  Laterality: N/A;   LEFT HEART CATH AND CORONARY ANGIOGRAPHY N/A 03/31/2019   Procedure: LEFT HEART CATH AND CORONARY ANGIOGRAPHY;  Surgeon: Troy Sine, MD;  Location: [facility];   Service: Cardiovascular;  Laterality: N/A;   LEFT HEART CATH AND CORONARY ANGIOGRAPHY N/A 02/02/2021   Procedure: LEFT HEART CATH AND CORONARY ANGIOGRAPHY;  Surgeon: Troy Sine, MD;  Location: [facility];  Service: Cardiovascular;  Laterality: N/A;   LEFT HEART CATH AND CORONARY ANGIOGRAPHY N/A 02/04/2021   Procedure: LEFT HEART CATH AND CORONARY ANGIOGRAPHY;  Surgeon: Troy Sine, MD;  Location: [facility];  Service: Cardiovascular;  Laterality: N/A;   NO PAST SURGERIES     RIGHT HEART CATH N/A 03/31/2019   Procedure: RIGHT HEART CATH;  Surgeon: Troy Sine, MD;  Location: [facility];  Service: Cardiovascular;  Laterality: N/A;   VENTRICULAR ASSIST DEVICE INSERTION N/A 03/31/2019   Procedure: VENTRICULAR ASSIST DEVICE INSERTION;  Surgeon: Troy Sine, MD;  Location: [facility];  Service: Cardiovascular;  Laterality: N/A;    ROS: Review of Systems Negative except as stated above  PHYSICAL EXAM: BP 118/80   Pulse 75   Resp 16   Wt 207 lb 12.8 oz (94.3 kg)   SpO2 95%   BMI 31.60 kg/m   Wt Readings from Last 3 Encounters:  04/23/21 207 lb 12.8 oz (94.3 kg)  04/06/21 208 lb 6.4 oz (94.5 kg)  03/24/21 202 lb 12.8 oz (92 kg)    Physical Exam   General appearance - alert, well appearing, older Caucasian male and in no distress Mental status - normal mood, behavior, speech, dress, motor activity, and thought processes Mouth - mucous membranes moist, pharynx normal without lesions Neck - supple, no significant adenopathy Chest -breath sounds mildly decreased with scattered wheezing  Heart - normal rate, regular rhythm, normal S1, S2, no murmurs, rubs, clicks or gallops Extremities - peripheral pulses normal, no pedal edema, no clubbing or cyanosis  CMP Latest Ref Rng & Units 04/16/2021 04/06/2021 03/24/2021  Glucose 70 - 99 mg/dL 134(H) 137(H) -  BUN 8 - 23 mg/dL 23 14 -  Creatinine 0.61 - 1.24 mg/dL 1.39(H) 1.24 -  Sodium 135 - 145 mmol/L  137 137 -  Potassium 3.5 - 5.1 mmol/L 3.7 4.2 -  Chloride 98 - 111 mmol/L 97(L)  101 -  CO2 22 - 32 mmol/L 30 28 -  Calcium 8.9 - 10.3 mg/dL 9.0 9.4 -  Total Protein 6.0 - 8.5 g/dL - - 7.8  Total Bilirubin 0.0 - 1.2 mg/dL - - 0.9  Alkaline Phos 44 - 121 IU/L - - 109  AST 0 - 40 IU/L - - 32  ALT 0 - 44 IU/L - - 40   Lipid Panel     Component Value Date/Time   CHOL 221 (H) 02/02/2021 0552   CHOL 117 11/16/2020 0912   TRIG 89 02/02/2021 0552   HDL 53 02/02/2021 0552   HDL 37 (L) 11/16/2020 0912   CHOLHDL 4.2 02/02/2021 0552   VLDL 18 02/02/2021 0552   LDLCALC 150 (H) 02/02/2021 0552   LDLCALC 66 11/16/2020 0912    CBC    Component Value Date/Time   WBC 10.7 (H) 02/06/2021 0311   RBC 5.27 02/06/2021 0311   HGB 15.4 02/06/2021 0311   HGB 14.7 06/08/2020 1003   HCT 47.0 02/06/2021 0311   HCT 44.8 06/08/2020 1003   PLT 329 02/06/2021 0311   PLT 276 06/08/2020 1003   MCV 89.2 02/06/2021 0311   MCV 89 06/08/2020 1003   MCH 29.2 02/06/2021 0311   MCHC 32.8 02/06/2021 0311   RDW 13.8 02/06/2021 0311   RDW 13.1 06/08/2020 1003   LYMPHSABS 0.9 04/09/2019 0615   MONOABS 0.9 04/09/2019 0615   EOSABS 0.3 04/09/2019 0615   BASOSABS 0.0 04/09/2019 0615    ASSESSMENT AND PLAN: 1. Controlled type 2 diabetes mellitus with other circulatory complication, without long-term current use of insulin ([place]) Continue Farxiga. Dietary counseling given. - Microalbumin / creatinine urine ratio  2. Pulmonary emphysema, unspecified emphysema type ([place]) Refill given on Spiriva and albuterol.  I recommend adding Dulera.  Prescription sent to [facility] for nebulizer machine. - tiotropium (SPIRIVA HANDIHALER) 18 MCG inhalation capsule; Place 1 capsule (18 mcg total) into inhaler and inhale daily.  Dispense: 30 capsule; Refill: 6 - mometasone-formoterol (DULERA) 100-5 MCG/ACT AERO; Inhale 2 puffs into the lungs 2 (two) times daily.  Dispense: 13 g; Refill: 6 - albuterol (VENTOLIN HFA) 108 (90 Base)  MCG/ACT inhaler; Inhale 2 puffs into the lungs every 6 (six) hours as needed for wheezing or shortness of breath.  Dispense: 18 g; Refill: 6 - For home use only DME Nebulizer machine  3. Essential hypertension At goal.  Continue current medications and low-salt diet.  4. Coronary artery disease involving native coronary artery of native heart without angina pectoris Continue statin, Zetia, bisoprolol, aspirin and Brilinta - CBC  5. Obesity (BMI 30.0-34.9) See #1 above.  6. Renal insufficiency Continue to monitor kidney function given diagnosis of CHF and being on Jardiance, furosemide and Entresto  7. Need for immunization against influenza - Flu Vaccine QUAD 28moIM (Fluarix, Fluzone & Alfiuria Quad PF)  8. Need for pneumococcal vaccine - Pneumococcal conjugate vaccine 20-valent    Patient was given the opportunity to ask questions.  Patient verbalized understanding of the plan and was able to repeat key elements of the plan.   Orders Placed This Encounter  Procedures   For home use only DME Nebulizer machine   Flu Vaccine QUAD 656moM (Fluarix, Fluzone & Alfiuria Quad PF)   Pneumococcal conjugate vaccine 20-valent   Microalbumin / creatinine urine ratio   CBC     Requested Prescriptions   Signed Prescriptions Disp Refills   tiotropium (SPIRIVA HANDIHALER) 18 MCG inhalation capsule 30 capsule 6    Sig:  Place 1 capsule (18 mcg total) into inhaler and inhale daily.   mometasone-formoterol (DULERA) 100-5 MCG/ACT AERO 13 g 6    Sig: Inhale 2 puffs into the lungs 2 (two) times daily.   albuterol (VENTOLIN HFA) 108 (90 Base) MCG/ACT inhaler 18 g 6    Sig: Inhale 2 puffs into the lungs every 6 (six) hours as needed for wheezing or shortness of breath.    Return in about 4 months (around 08/23/2021).  Karle Plumber, MD, FACP

## 2021-04-23 NOTE — Progress Notes (Signed)
Advanced Heart Failure Clinic Note   PCP: Johnson, Deborah B, MD PCP-Cardiologist: Thomas Kelly, MD  HF Cardiologist: Dr. Bensimhon  HPI: [name] is a 65 y.o. 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   Brought to ER 03/31/19 with respiratory failure/cardiac arrest. Intubated in ER. Initial ECG with anterior ST elevation. Cath by Dr. Kelly with 3v CAD and totally occluded prox LAD. LCX anomalous off RCA. Underwent PCI of LAD which was diffusely diseased vessel. Developed cardiogenic shock requiring pressors and mechanical support with impella.  03/31/2019 Echo 20-25%. Hospital course complicated by Torsades. EP consulted placed ICD prior to discharge.    Echo 2/21  EF 45-50%   Echo 4/22 EF 55-60%    He stopped Brillinta in 4/22. Admitted in 6/22 with NSTEMI. Cath with patent proximal LAD stent with mild 20% in-stent narrowing.  There was significant thrombus burden immediately after the stent extending into the diagonal vessel and mid LAD with TIMI-3 flow. Treated with Aggrastat x 48 hours. ECHO 02/02/21 EF 35-40% Relook angiography of the left coronary system showed improvement in the previous thrombus burden in the diagonal and LAD distal stent and beyond but moderate residual thrombus was still present. Underwent successful percutaneous coronary intervention of the LAD utilizing Pronto thrombectomy, intravascular ultrasound and ultimate PTCA of the distal stented segment and mid LAD with residual narrowing 0%. Recommended indefinite DAPT.   Today he returns for HF follow up. Still working FT in landscaping. No significant exertional dyspnea. Having some dizziness with position changes. Denies CP, edema, or PND/Orthopnea. Appetite ok. Cut back on fluids during the day. No fever or chills. Weight at home 2013 pounds. Just started Entresto 97/103 5 days ago. Will start CR at end of October.  - Echo (6/22): EF 30-35%, Grade II DD, RV normal  - LHC (6/22): Prox RCA  lesion is 20% stenosed. Prox LAD to Mid LAD lesion is 15% stenosed. 1st Diag lesion is 5% stenosed. Ost LAD to Prox LAD lesion is 20% stenosed. Dist LAD-1 lesion is 40% stenosed. Dist LAD-2 lesion is 80% stenosed. There is severe left ventricular systolic dysfunction. LV end diastolic pressure is severely elevated. The left ventricular ejection fraction is 25-35% by visual estimate.  Past Medical History:  Diagnosis Date   Cardiogenic shock (HCC)    CHF (congestive heart failure) (HCC)    Coronary artery disease    Diabetes mellitus without complication (HCC)    Hyperlipidemia    Medical history non-contributory    Current Outpatient Medications  Medication Sig Dispense Refill   Accu-Chek Softclix Lancets lancets Use to measure blood sugar twice a day 100 each 6   albuterol (ACCUNEB) 0.63 MG/3ML nebulizer solution Take 3 mLs (0.63 mg total) by nebulization every 6 (six) hours as needed for wheezing. 75 mL 12   albuterol (VENTOLIN HFA) 108 (90 Base) MCG/ACT inhaler Inhale 2 puffs into the lungs every 6 (six) hours as needed for wheezing or shortness of breath. 18 g 6   aspirin EC 81 MG tablet Take 1 tablet (81 mg total) by mouth daily. Swallow whole. 150 tablet 2   bisoprolol (ZEBETA) 5 MG tablet Take 1 tablet (5 mg total) by mouth daily. 30 tablet 4   Blood Glucose Monitoring Suppl (ACCU-CHEK GUIDE ME) w/Device KIT 1 kit by Does not apply route in the morning and at bedtime. Use to measure blood sugar twice a day 1 kit 0   empagliflozin (JARDIANCE) 10 MG TABS tablet Take   1 tablet (10 mg total) by mouth daily before breakfast. 90 tablet 3   ezetimibe (ZETIA) 10 MG tablet Take 1 tablet (10 mg total) by mouth daily. 90 tablet 3   fluticasone (FLONASE) 50 MCG/ACT nasal spray Place 1 spray into both nostrils daily as needed for allergies or rhinitis. 16 g 2   furosemide (LASIX) 40 MG tablet Take 1 tablet 40 mg Daily 30 tablet 5   glucose blood (ACCU-CHEK GUIDE) test strip Use to measure  blood sugar twice a day 100 each 6   Hyprom-Naphaz-Polysorb-Zn Sulf (CLEAR EYES COMPLETE) SOLN Apply 1 drop to eye 3 (three) times daily. 15 mL 1   loratadine (CLARITIN) 10 MG tablet Take 1 tablet (10 mg total) by mouth daily. 30 tablet 11   mometasone-formoterol (DULERA) 100-5 MCG/ACT AERO Inhale 2 puffs into the lungs 2 (two) times daily. 13 g 6   montelukast (SINGULAIR) 10 MG tablet Take 1 tablet (10 mg total) by mouth at bedtime. 30 tablet 3   nitroGLYCERIN (NITROSTAT) 0.4 MG SL tablet Place 1 tablet (0.4 mg total) under the tongue every 5 (five) minutes x 3 doses as needed for chest pain. 25 tablet 3   omeprazole (PRILOSEC) 20 MG capsule Take 1 capsule (20 mg total) by mouth daily. 30 capsule 3   rosuvastatin (CRESTOR) 40 MG tablet Take 1 tablet (40 mg total) by mouth daily. 90 tablet 3   sacubitril-valsartan (ENTRESTO) 97-103 MG Take 1 tablet by mouth 2 (two) times daily. 60 tablet 4   ticagrelor (BRILINTA) 90 MG TABS tablet Take 1 tablet (90 mg total) by mouth 2 (two) times daily. 180 tablet 3   tiotropium (SPIRIVA HANDIHALER) 18 MCG inhalation capsule Place 1 capsule (18 mcg total) into inhaler and inhale daily. (Patient taking differently: Place 18 mcg into inhaler and inhale daily. As needed) 30 capsule 6   traZODone (DESYREL) 50 MG tablet TAKE 1/2 TO 1 (ONE-HALF TO ONE) TABLET BY MOUTH EVERY DAY AT BEDTIME 30 tablet 0   No current facility-administered medications for this encounter.   Allergies  Allergen Reactions   Atorvastatin     Muscle weakness and fatigue on 80mg daily   Spironolactone     gynecomastia   Metformin And Related Anxiety and Palpitations   Other Anxiety and Palpitations   Social History   Socioeconomic History   Marital status: Married    Spouse name: Not on file   Number of children: 1   Years of education: 8   Highest education level: 8th grade  Occupational History   Not on file  Tobacco Use   Smoking status: Former    Types: Cigarettes    Quit  date: 04/08/2019    Years since quitting: 2.0   Smokeless tobacco: Never  Vaping Use   Vaping Use: Never used  Substance and Sexual Activity   Alcohol use: No   Drug use: No   Sexual activity: Not Currently  Other Topics Concern   Not on file  Social History Narrative   Lives with wife   Right handed   Caffeine: about 2 cups of coffee every morning, no soda   Social Determinants of Health   Financial Resource Strain: Not on file  Food Insecurity: Not on file  Transportation Needs: Not on file  Physical Activity: Not on file  Stress: Not on file  Social Connections: Not on file  Intimate Partner Violence: Not on file   Family History  Problem Relation Age of Onset   Hypertension   Mother    Heart failure Mother    Headache Neg Hx    Migraines Neg Hx    BP 120/78   Pulse 72   Wt 96.8 kg (213 lb 6.4 oz)   SpO2 97%   BMI 32.45 kg/m   Wt Readings from Last 3 Encounters:  04/27/21 96.8 kg (213 lb 6.4 oz)  04/23/21 94.3 kg (207 lb 12.8 oz)  04/06/21 94.5 kg (208 lb 6.4 oz)   PHYSICAL EXAM: General:  NAD. No resp difficulty HEENT: Normal Neck: Supple. No JVD. Carotids 2+ bilat; no bruits. No lymphadenopathy or thryomegaly appreciated. Cor: PMI nondisplaced. Regular rate & rhythm. No rubs, gallops or murmurs. Lungs: Clear, exp wheeze RLL Abdomen: Obese, nontender, nondistended. No hepatosplenomegaly. No bruits or masses. Good bowel sounds. Extremities: No cyanosis, clubbing, rash, edema Neuro: Alert & oriented x 3, cranial nerves grossly intact. Moves all 4 extremities w/o difficulty. Affect pleasant.  Device Interrogation: Unable to interrogate device in clinic today.  ASSESSMENT & PLAN:  1. Chronic systolic HF - Echo 8/20 EF20-25% - Echo 09/23/19: EF 45-50% - Echo 4/22 EF 55-60%  - NSTEMI in 6/22 -> Echo 6/22 EF 35-40% - Stable NYHA II, volume good today. - Start eplerenone 12.5 mg daily. - Change lasix to 40 mg PRN. - Continue Entresto 97/103 mg bid. -  Continue bisoprolol 5 mg daily. - Continue Jardiance 10 mg daily. - Suspect EF will improve as he is farther out from recent NSTEMI. - BMET today, repeat in 1 week, 4 weeks, and 8 weeks.  2. Cardiac arrest/VT/torsades 03/1999 - Now s/p ICD. - Will ask device RN to send transmission.   3. CAD  - s/p acute anterior MI 8/20 with PCI/DES to LAD. Diffusely diseased vessel - + residual CAD in RCA and anomalous LCX - NSTEMI in 6/22 after stopping Brillinta. Stent patent but clot after stent -> s/p thrombectomy and POBA - Continue DAPT and statin indefinitely. - No s/s ischemia currently (do not think HF driven by ischemia). - Continue Jardiance 10 mg daily. - Waiting to start Cardiac Rehab in Oct.   4. DM2 - On metformin & Jardiance.    5. COPD  - On inhalers. - Quit smoking 8/20.  6. HTN - Stable. - Meds as above.  Follow up in 4 weeks with APP, if dizziness persists will decrease Entresto to 49/51; and in 2-3 months with Dr. Bensimhon with echo.   M , [name] 04/27/21 

## 2021-04-24 LAB — CBC
Hematocrit: 39.9 % (ref 37.5–51.0)
Hemoglobin: 13.1 g/dL (ref 13.0–17.7)
MCH: 29.4 pg (ref 26.6–33.0)
MCHC: 32.8 g/dL (ref 31.5–35.7)
MCV: 90 fL (ref 79–97)
Platelets: 258 10*3/uL (ref 150–450)
RBC: 4.46 x10E6/uL (ref 4.14–5.80)
RDW: 14.1 % (ref 11.6–15.4)
WBC: 7.2 10*3/uL (ref 3.4–10.8)

## 2021-04-24 LAB — MICROALBUMIN / CREATININE URINE RATIO
Creatinine, Urine: 118.2 mg/dL
Microalb/Creat Ratio: 30 mg/g creat — ABNORMAL HIGH (ref 0–29)
Microalbumin, Urine: 35.3 ug/mL

## 2021-04-27 ENCOUNTER — Ambulatory Visit (HOSPITAL_COMMUNITY)
Admission: RE | Admit: 2021-04-27 | Discharge: 2021-04-27 | Disposition: A | Discharge status: Home / Self Care | Payer: Medicaid Other | Source: Ambulatory Visit | Attending: Family Medicine | Admitting: Family Medicine

## 2021-04-27 ENCOUNTER — Telehealth: Payer: Self-pay

## 2021-04-27 ENCOUNTER — Other Ambulatory Visit: Payer: Self-pay

## 2021-04-27 ENCOUNTER — Encounter (HOSPITAL_COMMUNITY): Payer: Self-pay

## 2021-04-27 ENCOUNTER — Other Ambulatory Visit (HOSPITAL_COMMUNITY): Payer: Self-pay

## 2021-04-27 VITALS — BP 120/78 | HR 72 | Wt 213.4 lb

## 2021-04-27 DIAGNOSIS — Z8249 Family history of ischemic heart disease and other diseases of the circulatory system: Secondary | ICD-10-CM | POA: Diagnosis not present

## 2021-04-27 DIAGNOSIS — Z7984 Long term (current) use of oral hypoglycemic drugs: Secondary | ICD-10-CM | POA: Insufficient documentation

## 2021-04-27 DIAGNOSIS — Z79899 Other long term (current) drug therapy: Secondary | ICD-10-CM | POA: Diagnosis not present

## 2021-04-27 DIAGNOSIS — Z7982 Long term (current) use of aspirin: Secondary | ICD-10-CM | POA: Diagnosis not present

## 2021-04-27 DIAGNOSIS — I10 Essential (primary) hypertension: Secondary | ICD-10-CM | POA: Diagnosis not present

## 2021-04-27 DIAGNOSIS — Z8674 Personal history of sudden cardiac arrest: Secondary | ICD-10-CM | POA: Insufficient documentation

## 2021-04-27 DIAGNOSIS — Z87891 Personal history of nicotine dependence: Secondary | ICD-10-CM | POA: Diagnosis not present

## 2021-04-27 DIAGNOSIS — I252 Old myocardial infarction: Secondary | ICD-10-CM | POA: Insufficient documentation

## 2021-04-27 DIAGNOSIS — J441 Chronic obstructive pulmonary disease with (acute) exacerbation: Secondary | ICD-10-CM | POA: Diagnosis not present

## 2021-04-27 DIAGNOSIS — I5022 Chronic systolic (congestive) heart failure: Secondary | ICD-10-CM | POA: Insufficient documentation

## 2021-04-27 DIAGNOSIS — E119 Type 2 diabetes mellitus without complications: Secondary | ICD-10-CM

## 2021-04-27 DIAGNOSIS — Z955 Presence of coronary angioplasty implant and graft: Secondary | ICD-10-CM | POA: Insufficient documentation

## 2021-04-27 DIAGNOSIS — J449 Chronic obstructive pulmonary disease, unspecified: Secondary | ICD-10-CM | POA: Diagnosis not present

## 2021-04-27 DIAGNOSIS — Z95810 Presence of automatic (implantable) cardiac defibrillator: Secondary | ICD-10-CM | POA: Diagnosis not present

## 2021-04-27 DIAGNOSIS — E785 Hyperlipidemia, unspecified: Secondary | ICD-10-CM | POA: Diagnosis not present

## 2021-04-27 DIAGNOSIS — I2510 Atherosclerotic heart disease of native coronary artery without angina pectoris: Secondary | ICD-10-CM

## 2021-04-27 DIAGNOSIS — Z7951 Long term (current) use of inhaled steroids: Secondary | ICD-10-CM | POA: Diagnosis not present

## 2021-04-27 DIAGNOSIS — I110 Hypertensive heart disease with heart failure: Secondary | ICD-10-CM | POA: Insufficient documentation

## 2021-04-27 LAB — BASIC METABOLIC PANEL
Anion gap: 6 (ref 5–15)
BUN: 21 mg/dL (ref 8–23)
CO2: 25 mmol/L (ref 22–32)
Calcium: 9.3 mg/dL (ref 8.9–10.3)
Chloride: 104 mmol/L (ref 98–111)
Creatinine, Ser: 1.21 mg/dL (ref 0.61–1.24)
GFR, Estimated: >60 mL/min (ref >60)
Glucose, Bld: 140 mg/dL — ABNORMAL HIGH (ref 70–99)
Potassium: 5 mmol/L (ref 3.5–5.1)
Sodium: 135 mmol/L (ref 135–145)

## 2021-04-27 MED ORDER — FUROSEMIDE 40 MG PO TABS: ORAL_TABLET | ORAL | 5 refills | Status: DC

## 2021-04-27 MED ORDER — EPLERENONE 25 MG PO TABS: 12.5000 mg | ORAL_TABLET | Freq: Every day | ORAL | 4 refills | Status: DC

## 2021-04-27 NOTE — Telephone Encounter (Signed)
Contacted pt to go over lab results pt didn't answer lvm   Sent a CRM and forward labs to NT to give pt labs when they call back   

## 2021-04-27 NOTE — Patient Instructions (Addendum)
Labs were performed today, if any labs are abnormal the clinic will call you,   Labs need to be repeated in 10 days   CHANGE Lasix to as needed for a weight gain of 3 lbs in 24 hours or 5 lbs in a week.  START Eplerenone 12.5 mg daily  Your physician recommends that you schedule a follow-up appointment in: 4 weeks and then again in 2-3 month with echocardiogram   At the [facility], you and your health needs are our priority. As part of our continuing mission to provide you with exceptional heart care, we have created designated Provider Care Teams. These Care Teams include your primary Cardiologist (physician) and Advanced Practice Providers (APPs- Physician Assistants and Nurse Practitioners) who all work together to provide you with the care you need, when you need it.   You may see any of the following providers on your designated Care Team at your next follow up: Dr [name] Dr Loralie Champagne Dr Patrice Paradise, NP Lyda Jester, [place] Ginnie Smart Audry Riles, PharmD   Please be sure to bring in all your medications bottles to every appointment. .  If you have any questions or concerns before your next appointment please send [place] a message through [place] or call our office at [phone].    TO LEAVE A MESSAGE FOR THE NURSE SELECT OPTION 2, PLEASE LEAVE A MESSAGE INCLUDING: YOUR NAME DATE OF BIRTH CALL BACK NUMBER REASON FOR CALL**this is important as we prioritize the call backs  YOU WILL RECEIVE A CALL BACK THE SAME DAY AS LONG AS YOU CALL BEFORE 4:00 PM

## 2021-04-28 ENCOUNTER — Telehealth: Payer: Self-pay

## 2021-04-28 NOTE — Telephone Encounter (Signed)
Spoke with patient.  Explained reason for call was to assist him in sending a remote transmission from the monitor at home for Allena Katz, NP at HF clinic to review.  He is not home but can call back at 4:30 to assist with sending a report.

## 2021-04-28 NOTE — Telephone Encounter (Addendum)
-----   Message from Rafael Bihari, [place] sent at 04/27/2021  9:06 AM EDT ----- Regarding: Maynard you had a nice labor day weekend. Our [employer] isn't working in clinic. Can you send a transmission in the next day or so on [place] please?  Thanks! -J

## 2021-04-28 NOTE — Telephone Encounter (Signed)
Attempted call to patient to assist with sending manual remote transmission.  No answer and left message to return call.

## 2021-04-30 NOTE — Telephone Encounter (Signed)
Attempted call to patient to assist with manual remote transmission and phone number [phone] is not a working number today.  Was previously able to reach patient on this number.  Unable to provide remote transmission for HF clinic as requested.

## 2021-05-04 ENCOUNTER — Other Ambulatory Visit: Payer: Self-pay | Admitting: Internal Medicine

## 2021-05-04 DIAGNOSIS — G4709 Other insomnia: Secondary | ICD-10-CM

## 2021-05-04 NOTE — Progress Notes (Signed)
Remote ICD transmission.   

## 2021-05-06 ENCOUNTER — Telehealth (HOSPITAL_COMMUNITY): Payer: Self-pay | Admitting: Cardiology

## 2021-05-06 MED ORDER — LOKELMA 5 G PO PACK: 10.0000 g | PACK | Freq: Every day | ORAL | 11 refills | Status: DC

## 2021-05-06 NOTE — Telephone Encounter (Signed)
-----   Message from Rafael Bihari, [place] sent at 04/27/2021  4:07 PM EDT ----- Labs stable. With starting spiro today, I anticipate potassium increasing. Will start Lokelma 5 g daily to ensure potassium does not get too high. Will follow kidney function with repeat labs.

## 2021-05-06 NOTE — Telephone Encounter (Signed)
Patient called.  Patient aware.  

## 2021-05-07 ENCOUNTER — Other Ambulatory Visit: Payer: Self-pay

## 2021-05-07 ENCOUNTER — Other Ambulatory Visit: Payer: Self-pay | Admitting: Internal Medicine

## 2021-05-07 ENCOUNTER — Ambulatory Visit (HOSPITAL_COMMUNITY)
Admission: RE | Admit: 2021-05-07 | Discharge: 2021-05-07 | Disposition: A | Discharge status: Home / Self Care | Payer: Medicaid Other | Source: Ambulatory Visit | Attending: Cardiology | Admitting: Cardiology

## 2021-05-07 DIAGNOSIS — K219 Gastro-esophageal reflux disease without esophagitis: Secondary | ICD-10-CM

## 2021-05-07 DIAGNOSIS — I5022 Chronic systolic (congestive) heart failure: Secondary | ICD-10-CM | POA: Diagnosis not present

## 2021-05-07 LAB — BASIC METABOLIC PANEL
Anion gap: 6 (ref 5–15)
BUN: 21 mg/dL (ref 8–23)
CO2: 24 mmol/L (ref 22–32)
Calcium: 9.4 mg/dL (ref 8.9–10.3)
Chloride: 108 mmol/L (ref 98–111)
Creatinine, Ser: 1.32 mg/dL — ABNORMAL HIGH (ref 0.61–1.24)
GFR, Estimated: >60 mL/min (ref >60)
Glucose, Bld: 127 mg/dL — ABNORMAL HIGH (ref 70–99)
Potassium: 5 mmol/L (ref 3.5–5.1)
Sodium: 138 mmol/L (ref 135–145)

## 2021-05-14 ENCOUNTER — Encounter (HOSPITAL_COMMUNITY): Payer: Self-pay

## 2021-05-14 ENCOUNTER — Telehealth (HOSPITAL_COMMUNITY): Payer: Self-pay

## 2021-05-14 NOTE — Telephone Encounter (Signed)
Pt returned CR phone call, he stated he is not interested in CR at this time, "he is pretty busy", per pt.   Closed referral

## 2021-05-14 NOTE — Telephone Encounter (Signed)
Attempted to call patient in regards to Cardiac Rehab - LM on VM Mailed letter 

## 2021-05-24 NOTE — Progress Notes (Signed)
Advanced Heart Failure Clinic Note   PCP: Ladell Pier, MD PCP-Cardiologist: Shelva Majestic, MD  HF Cardiologist: Dr. Haroldine Laws  HPI: [name] is a 65 y.o. 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   Brought to ER 03/31/19 with respiratory failure/cardiac arrest. Intubated in ER. Initial ECG with anterior ST elevation. Cath by Dr. Claiborne Billings with 3v CAD and totally occluded prox LAD. LCX anomalous off RCA. Underwent PCI of LAD which was diffusely diseased vessel. Developed cardiogenic shock requiring pressors and mechanical support with impella.  03/31/2019 Echo 20-25%. Hospital course complicated by Torsades. EP consulted placed ICD prior to discharge.    Echo 2/21  EF 45-50%   Echo 4/22 EF 55-60%    He stopped Brillinta in 4/22. Admitted in 6/22 with NSTEMI. Cath with patent proximal LAD stent with mild 20% in-stent narrowing.  There was significant thrombus burden immediately after the stent extending into the diagonal vessel and mid LAD with TIMI-3 flow. Treated with Aggrastat x 48 hours. ECHO 02/02/21 EF 35-40% Relook angiography of the left coronary system showed improvement in the previous thrombus burden in the diagonal and LAD distal stent and beyond but moderate residual thrombus was still present. Underwent successful percutaneous coronary intervention of the LAD utilizing Pronto thrombectomy, intravascular ultrasound and ultimate PTCA of the distal stented segment and mid LAD with residual narrowing 0%. Recommended indefinite DAPT.   Today he returns for HF follow up. He is working FT in Biomedical scientist and has no significant exertional dyspnea. He has been having muscle cramps, abdominal pains and nausea x 2 weeks. He stopped his statin for a few days and all symptoms resolved. Has some dizziness with position changes but no falls. Denies CP,  edema, or PND/Orthopnea. Appetite ok. No fever or chills. Weight at home 208 pounds. Taking all medications. Took lasix  2-3x last week. Wife is in ED with blockages in her legs.  - Echo (6/22): EF 30-35%, Grade II DD, RV normal  - LHC (6/22): Prox RCA lesion is 20% stenosed. Prox LAD to Mid LAD lesion is 15% stenosed. 1st Diag lesion is 5% stenosed. Ost LAD to Prox LAD lesion is 20% stenosed. Dist LAD-1 lesion is 40% stenosed. Dist LAD-2 lesion is 80% stenosed. There is severe left ventricular systolic dysfunction. LV end diastolic pressure is severely elevated. The left ventricular ejection fraction is 25-35% by visual estimate.  Past Medical History:  Diagnosis Date   Cardiogenic shock (HCC)    CHF (congestive heart failure) (HCC)    Coronary artery disease    Diabetes mellitus without complication ([place])    Hyperlipidemia    Medical history non-contributory    Current Outpatient Medications  Medication Sig Dispense Refill   Accu-Chek Softclix Lancets lancets Use to measure blood sugar twice a day 100 each 6   albuterol (ACCUNEB) 0.63 MG/3ML nebulizer solution Take 3 mLs (0.63 mg total) by nebulization every 6 (six) hours as needed for wheezing. 75 mL 12   albuterol (VENTOLIN HFA) 108 (90 Base) MCG/ACT inhaler Inhale 2 puffs into the lungs every 6 (six) hours as needed for wheezing or shortness of breath. 18 g 6   aspirin EC 81 MG tablet Take 1 tablet (81 mg total) by mouth daily. Swallow whole. 150 tablet 2   bisoprolol (ZEBETA) 5 MG tablet Take 1 tablet (5 mg total) by mouth daily. 30 tablet 4   Blood Glucose Monitoring Suppl (ACCU-CHEK GUIDE ME) w/Device KIT 1 kit by Does not  apply route in the morning and at bedtime. Use to measure blood sugar twice a day 1 kit 0   empagliflozin (JARDIANCE) 10 MG TABS tablet Take 1 tablet (10 mg total) by mouth daily before breakfast. 90 tablet 3   eplerenone (INSPRA) 25 MG tablet Take 0.5 tablets (12.5 mg total) by mouth daily. 30 tablet 4   ezetimibe (ZETIA) 10 MG tablet Take 1 tablet (10 mg total) by mouth daily. 90 tablet 3   fluticasone (FLONASE) 50  MCG/ACT nasal spray Place 1 spray into both nostrils daily as needed for allergies or rhinitis. 16 g 2   furosemide (LASIX) 40 MG tablet Take 1 tablet 40 mg prn for weight gain of 3 lbs in 24 hours or 5 lbs in a week 30 tablet 5   glucose blood (ACCU-CHEK GUIDE) test strip Use to measure blood sugar twice a day 100 each 6   Hyprom-Naphaz-Polysorb-Zn Sulf (CLEAR EYES COMPLETE) SOLN Apply 1 drop to eye 3 (three) times daily. 15 mL 1   loratadine (CLARITIN) 10 MG tablet Take 1 tablet (10 mg total) by mouth daily. 30 tablet 11   mometasone-formoterol (DULERA) 100-5 MCG/ACT AERO Inhale 2 puffs into the lungs 2 (two) times daily. 13 g 6   montelukast (SINGULAIR) 10 MG tablet Take 1 tablet (10 mg total) by mouth at bedtime. 30 tablet 3   nitroGLYCERIN (NITROSTAT) 0.4 MG SL tablet Place 1 tablet (0.4 mg total) under the tongue every 5 (five) minutes x 3 doses as needed for chest pain. 25 tablet 3   omeprazole (PRILOSEC) 20 MG capsule Take 1 capsule by mouth once daily 30 capsule 11   rosuvastatin (CRESTOR) 40 MG tablet Take 1 tablet (40 mg total) by mouth daily. 90 tablet 3   sacubitril-valsartan (ENTRESTO) 97-103 MG Take 1 tablet by mouth 2 (two) times daily. 60 tablet 4   ticagrelor (BRILINTA) 90 MG TABS tablet Take 1 tablet (90 mg total) by mouth 2 (two) times daily. 180 tablet 3   tiotropium (SPIRIVA HANDIHALER) 18 MCG inhalation capsule Place 1 capsule (18 mcg total) into inhaler and inhale daily. (Patient taking differently: Place 18 mcg into inhaler and inhale daily. As needed) 30 capsule 6   traZODone (DESYREL) 50 MG tablet TAKE 1/2 TO 1 TABLET BY MOUTH AT BEDTIME 90 tablet 1   sodium zirconium cyclosilicate (LOKELMA) 5 g packet Take 10 g by mouth daily. (Patient not taking: Reported on 05/25/2021) 30 each 11   No current facility-administered medications for this encounter.   Allergies  Allergen Reactions   Atorvastatin     Muscle weakness and fatigue on 36m daily   Spironolactone      gynecomastia   Metformin And Related Anxiety and Palpitations   Other Anxiety and Palpitations   Social History   Socioeconomic History   Marital status: Married    Spouse name: Not on file   Number of children: 1   Years of education: 8   Highest education level: 8th grade  Occupational History   Not on file  Tobacco Use   Smoking status: Former    Types: Cigarettes    Quit date: 04/08/2019    Years since quitting: 2.1   Smokeless tobacco: Never  Vaping Use   Vaping Use: Never used  Substance and Sexual Activity   Alcohol use: No   Drug use: No   Sexual activity: Not Currently  Other Topics Concern   Not on file  Social History Narrative   Lives with wife  Right handed   Caffeine: about 2 cups of coffee every morning, no soda   Social Determinants of Health   Financial Resource Strain: Not on file  Food Insecurity: Not on file  Transportation Needs: Not on file  Physical Activity: Not on file  Stress: Not on file  Social Connections: Not on file  Intimate Partner Violence: Not on file   Family History  Problem Relation Age of Onset   Hypertension Mother    Heart failure Mother    Headache Neg Hx    Migraines Neg Hx    BP 140/76   Pulse 65   Wt 95.3 kg (210 lb 3.2 oz)   SpO2 95%   BMI 31.96 kg/m   Wt Readings from Last 3 Encounters:  05/25/21 95.3 kg (210 lb 3.2 oz)  04/27/21 96.8 kg (213 lb 6.4 oz)  04/23/21 94.3 kg (207 lb 12.8 oz)   PHYSICAL EXAM: General:  NAD. No resp difficulty HEENT: Normal Neck: Supple. No JVD. Carotids 2+ bilat; no bruits. No lymphadenopathy or thryomegaly appreciated. Cor: PMI nondisplaced. Regular rate & rhythm. No rubs, gallops or murmurs. Lungs: Faint wheezes bases Abdomen: Obese, nontender, nondistended. No hepatosplenomegaly. No bruits or masses. Good bowel sounds. Extremities: No cyanosis, clubbing, rash, edema Neuro: Alert & oriented x 3, cranial nerves grossly intact. Moves all 4 extremities w/o difficulty.  Affect pleasant.  Device Interrogation: CoreVue stable, no AT/AF, no VT, daily activity ~6-8 hours (personally reviewed).  ASSESSMENT & PLAN:  1. Chronic Systolic HF - Echo 3/00 PQ33-00% - Echo 09/23/19: EF 45-50% - Echo 4/22 EF 55-60%  - NSTEMI in 6/22 -> Echo 6/22 EF 35-40% - Stable NYHA II, volume good today. - Continue eplerenone 12.5 mg daily. - Continue lasix 40 mg PRN. - Continue Entresto 97/103 mg bid. - Continue bisoprolol 5 mg daily. - Continue Jardiance 10 mg daily. - Suspect EF will improve as he is farther out from recent NSTEMI. - BMET today.  2. Cardiac arrest/VT/torsades 03/1999 - Now s/p ICD. - Device interrogation as above.  3. Statin-Induced Myalgias - We discussed reducing Crestor to 20 mg or taking QOD. He wants to stop and change medications. - Stop Crestor. Ideally would like to keep on high-intensity statin, however I am unsure he will be able to tolerate. - Start pravastatin 80 mg (personally discussed with PharmD) - We discussed trial of OTC CoQ 10 with statin to see if that helps. - Check CMET and CK today. - May need Lipid Clinic referral if he cannot tolerate statins. - Re-check lipids in 6-8 weeks.   4. CAD  - s/p acute anterior MI 8/20 with PCI/DES to LAD. Diffusely diseased vessel - + residual CAD in RCA and anomalous LCX - NSTEMI in 6/22 after stopping Brillinta. Stent patent but clot after stent -> s/p thrombectomy and POBA - Continue DAPT and statin indefinitely. See statin changes above. - No s/s ischemia currently (do not think HF driven by ischemia). - Continue Jardiance 10 mg daily. - Waiting to start Cardiac Rehab in Oct.   5. DM2 - On metformin & Jardiance.    6. COPD  - On inhalers. - Quit smoking 8/20 but lives with wife who smokes.  7. HTN - Mildly elevated today, however he has not had his AM meds and his wife is recently hospitalized and he is stressed about her health. - Meds as above.  Follow up with Dr. Haroldine Laws +  echo as scheduled in 2 months.  Rafael Bihari, [name]  05/25/21

## 2021-05-25 ENCOUNTER — Ambulatory Visit (HOSPITAL_COMMUNITY)
Admission: RE | Admit: 2021-05-25 | Discharge: 2021-05-25 | Disposition: A | Discharge status: Home / Self Care | Payer: Medicare Other | Source: Ambulatory Visit | Attending: Family Medicine | Admitting: Family Medicine

## 2021-05-25 ENCOUNTER — Encounter (HOSPITAL_COMMUNITY): Payer: Self-pay

## 2021-05-25 ENCOUNTER — Other Ambulatory Visit: Payer: Self-pay

## 2021-05-25 VITALS — BP 140/76 | HR 65 | Wt 210.2 lb

## 2021-05-25 DIAGNOSIS — T466X5D Adverse effect of antihyperlipidemic and antiarteriosclerotic drugs, subsequent encounter: Secondary | ICD-10-CM | POA: Diagnosis not present

## 2021-05-25 DIAGNOSIS — Z955 Presence of coronary angioplasty implant and graft: Secondary | ICD-10-CM | POA: Diagnosis not present

## 2021-05-25 DIAGNOSIS — J449 Chronic obstructive pulmonary disease, unspecified: Secondary | ICD-10-CM | POA: Insufficient documentation

## 2021-05-25 DIAGNOSIS — J441 Chronic obstructive pulmonary disease with (acute) exacerbation: Secondary | ICD-10-CM | POA: Diagnosis not present

## 2021-05-25 DIAGNOSIS — Z7984 Long term (current) use of oral hypoglycemic drugs: Secondary | ICD-10-CM | POA: Insufficient documentation

## 2021-05-25 DIAGNOSIS — I10 Essential (primary) hypertension: Secondary | ICD-10-CM | POA: Diagnosis not present

## 2021-05-25 DIAGNOSIS — M791 Myalgia, unspecified site: Secondary | ICD-10-CM | POA: Insufficient documentation

## 2021-05-25 DIAGNOSIS — R252 Cramp and spasm: Secondary | ICD-10-CM | POA: Insufficient documentation

## 2021-05-25 DIAGNOSIS — Z8249 Family history of ischemic heart disease and other diseases of the circulatory system: Secondary | ICD-10-CM | POA: Insufficient documentation

## 2021-05-25 DIAGNOSIS — I110 Hypertensive heart disease with heart failure: Secondary | ICD-10-CM | POA: Diagnosis not present

## 2021-05-25 DIAGNOSIS — Z7982 Long term (current) use of aspirin: Secondary | ICD-10-CM | POA: Insufficient documentation

## 2021-05-25 DIAGNOSIS — I2510 Atherosclerotic heart disease of native coronary artery without angina pectoris: Secondary | ICD-10-CM | POA: Insufficient documentation

## 2021-05-25 DIAGNOSIS — R109 Unspecified abdominal pain: Secondary | ICD-10-CM | POA: Insufficient documentation

## 2021-05-25 DIAGNOSIS — R110 Nausea: Secondary | ICD-10-CM | POA: Diagnosis not present

## 2021-05-25 DIAGNOSIS — Z87891 Personal history of nicotine dependence: Secondary | ICD-10-CM | POA: Insufficient documentation

## 2021-05-25 DIAGNOSIS — Z95810 Presence of automatic (implantable) cardiac defibrillator: Secondary | ICD-10-CM | POA: Diagnosis not present

## 2021-05-25 DIAGNOSIS — Z888 Allergy status to other drugs, medicaments and biological substances status: Secondary | ICD-10-CM | POA: Diagnosis not present

## 2021-05-25 DIAGNOSIS — T466X5A Adverse effect of antihyperlipidemic and antiarteriosclerotic drugs, initial encounter: Secondary | ICD-10-CM | POA: Diagnosis not present

## 2021-05-25 DIAGNOSIS — Z8674 Personal history of sudden cardiac arrest: Secondary | ICD-10-CM | POA: Diagnosis not present

## 2021-05-25 DIAGNOSIS — I252 Old myocardial infarction: Secondary | ICD-10-CM | POA: Insufficient documentation

## 2021-05-25 DIAGNOSIS — Z79899 Other long term (current) drug therapy: Secondary | ICD-10-CM | POA: Insufficient documentation

## 2021-05-25 DIAGNOSIS — E119 Type 2 diabetes mellitus without complications: Secondary | ICD-10-CM | POA: Diagnosis not present

## 2021-05-25 DIAGNOSIS — Z7951 Long term (current) use of inhaled steroids: Secondary | ICD-10-CM | POA: Insufficient documentation

## 2021-05-25 DIAGNOSIS — I5022 Chronic systolic (congestive) heart failure: Secondary | ICD-10-CM | POA: Insufficient documentation

## 2021-05-25 DIAGNOSIS — R42 Dizziness and giddiness: Secondary | ICD-10-CM | POA: Insufficient documentation

## 2021-05-25 LAB — COMPREHENSIVE METABOLIC PANEL
ALT: 29 U/L (ref 0–44)
AST: 33 U/L (ref 15–41)
Albumin: 4 g/dL (ref 3.5–5.0)
Alkaline Phosphatase: 76 U/L (ref 38–126)
Anion gap: 6 (ref 5–15)
BUN: 15 mg/dL (ref 8–23)
CO2: 28 mmol/L (ref 22–32)
Calcium: 9.5 mg/dL (ref 8.9–10.3)
Chloride: 107 mmol/L (ref 98–111)
Creatinine, Ser: 1.19 mg/dL (ref 0.61–1.24)
GFR, Estimated: >60 mL/min (ref >60)
Glucose, Bld: 122 mg/dL — ABNORMAL HIGH (ref 70–99)
Potassium: 3.8 mmol/L (ref 3.5–5.1)
Sodium: 141 mmol/L (ref 135–145)
Total Bilirubin: 0.6 mg/dL (ref 0.3–1.2)
Total Protein: 6.7 g/dL (ref 6.5–8.1)

## 2021-05-25 LAB — CK: Total CK: 340 U/L (ref 49–397)

## 2021-05-25 MED ORDER — PRAVASTATIN SODIUM 80 MG PO TABS: 80.0000 mg | ORAL_TABLET | Freq: Every evening | ORAL | 11 refills | Status: DC

## 2021-05-25 MED ORDER — COENZYME Q10 30 MG PO CAPS: 30.0000 mg | ORAL_CAPSULE | Freq: Every day | ORAL | 3 refills | Status: DC

## 2021-05-25 NOTE — Patient Instructions (Addendum)
STOP Crestor START Pravastatin 80 mg, one tab daily START CoQ10 100 mg, one tab daily when you take the Pravastatin   Labs today We will only contact you if something comes back abnormal or we need to make some changes. Otherwise no news is good news!   Keep follow up as scheduled  Do the following things EVERYDAY: Weigh yourself in the morning before breakfast. Write it down and keep it in a log. Take your medicines as prescribed Eat low salt foods--Limit salt (sodium) to 2000 mg per day.  Stay as active as you can everyday Limit all fluids for the day to less than 2 liters  At the [facility], you and your health needs are our priority. As part of our continuing mission to provide you with exceptional heart care, we have created designated Provider Care Teams. These Care Teams include your primary Cardiologist (physician) and Advanced Practice Providers (APPs- Physician Assistants and Nurse Practitioners) who all work together to provide you with the care you need, when you need it.   You may see any of the following providers on your designated Care Team at your next follow up: Dr [name] Dr Loralie Champagne Dr Patrice Paradise, NP Lyda Jester, [place] Ginnie Smart Audry Riles, PharmD   Please be sure to bring in all your medications bottles to every appointment.   If you have any questions or concerns before your next appointment please send [place] a message through [place] or call our office at [phone].    TO LEAVE A MESSAGE FOR THE NURSE SELECT OPTION 2, PLEASE LEAVE A MESSAGE INCLUDING: YOUR NAME DATE OF BIRTH CALL BACK NUMBER REASON FOR CALL**this is important as we prioritize the call backs  YOU WILL RECEIVE A CALL BACK THE SAME DAY AS LONG AS YOU CALL BEFORE 4:00 PM

## 2021-05-26 IMAGING — DX PORTABLE CHEST - 1 VIEW
2 series · 2 of 2 positions shown · non-contrast
Comparison: 03/31/2019.

CLINICAL DATA: Intubation.  Cardiac arrest.

EXAM:
PORTABLE CHEST 1 VIEW

[chest ap (1 of 2)]
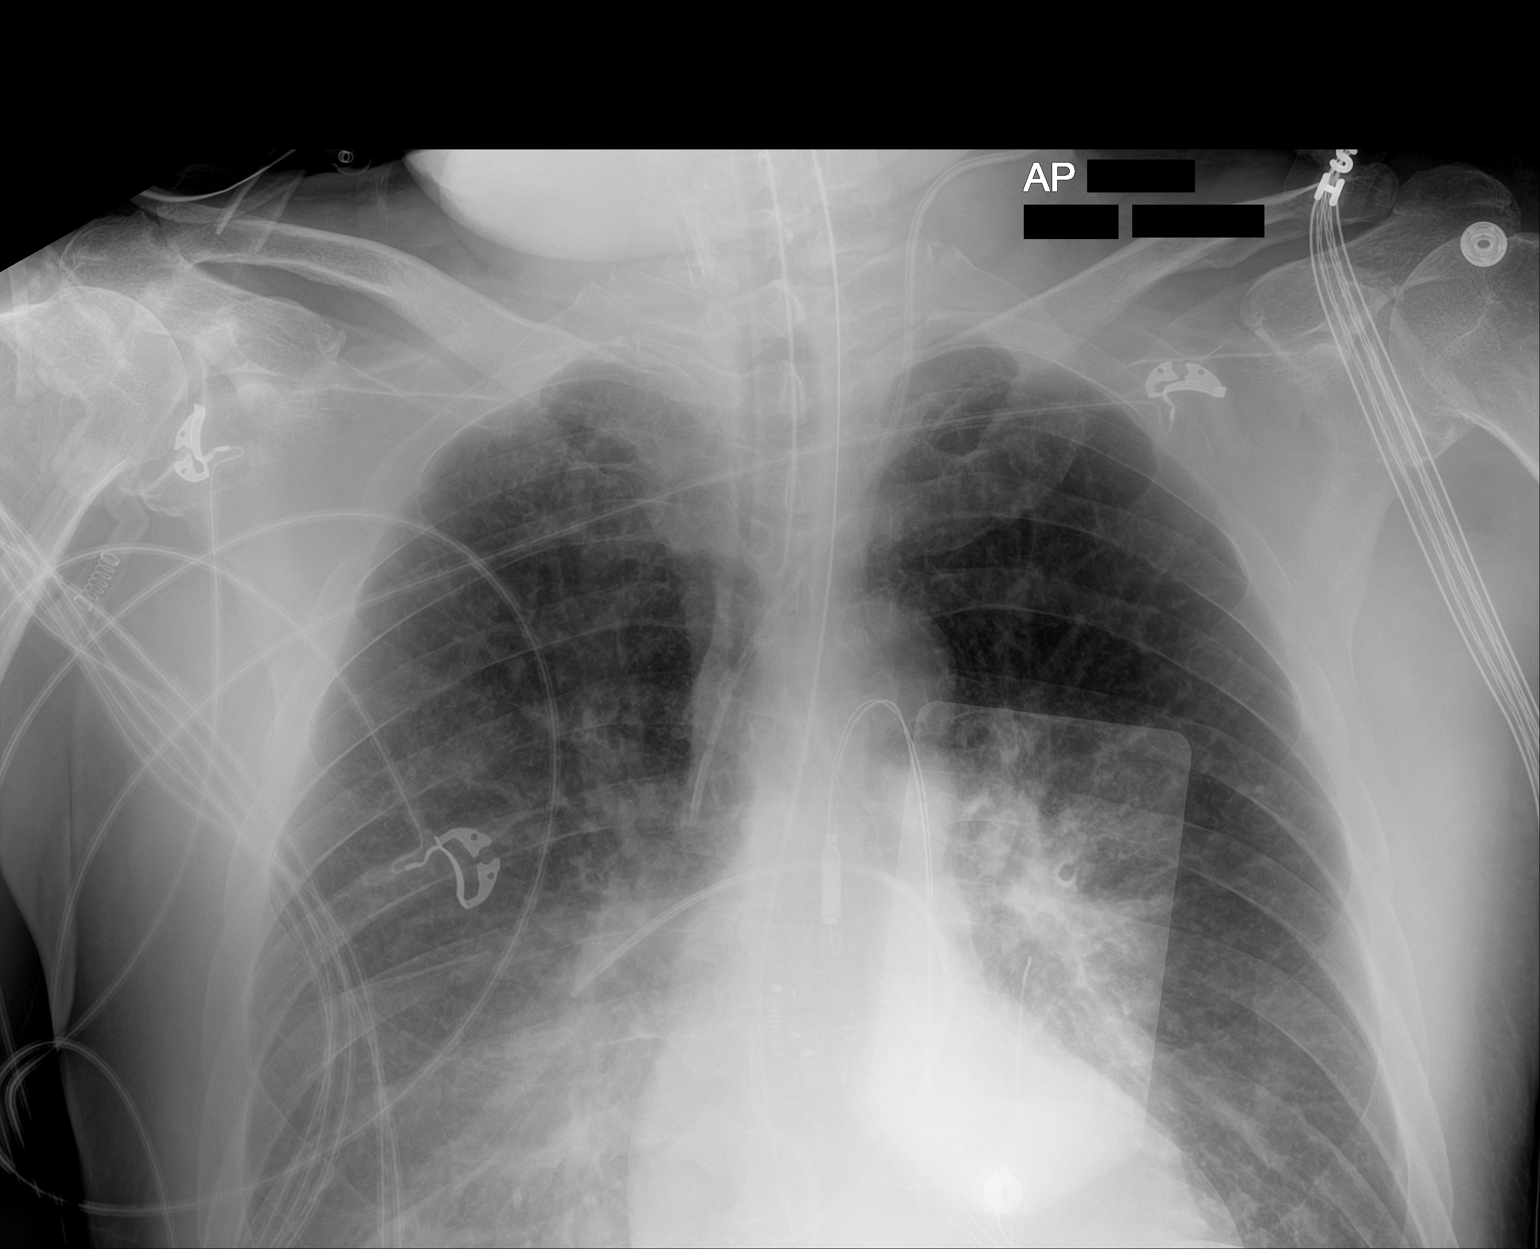

[chest ap (2 of 2)]
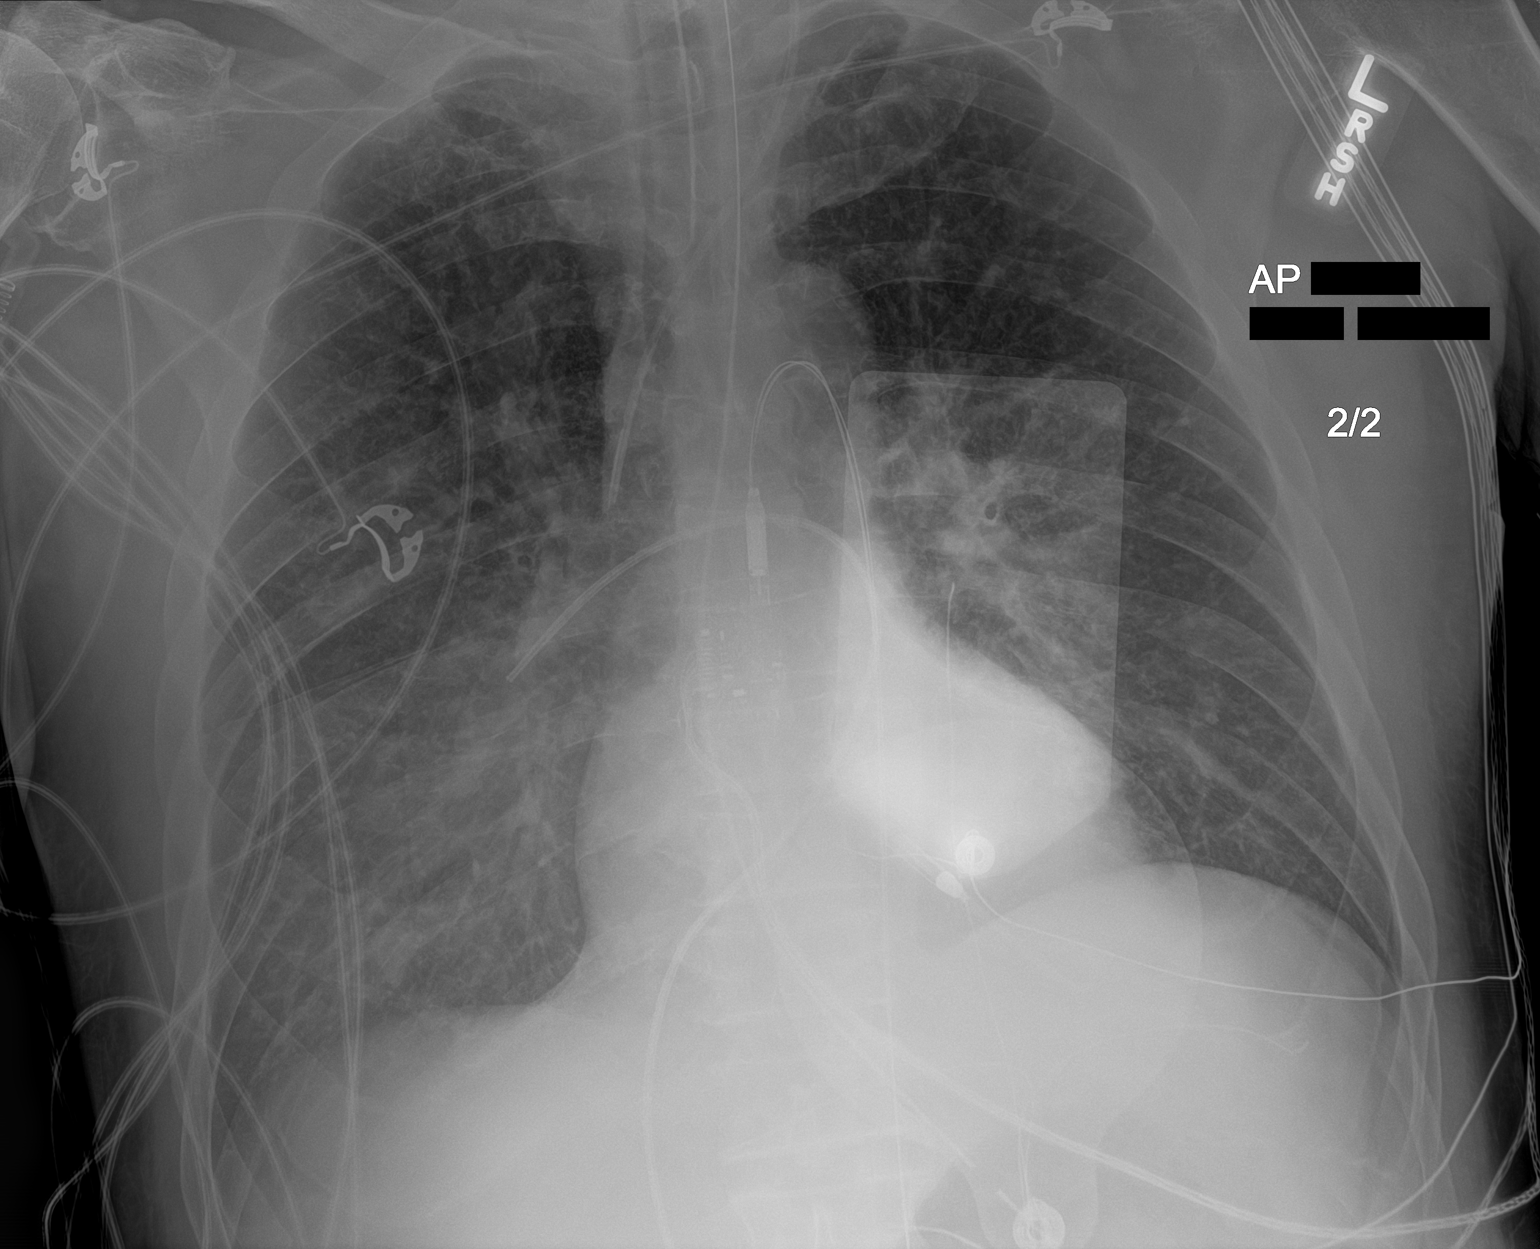

[2 of 2 positions shown; findings below may reference images not displayed]

FINDINGS: Endotracheal tube, left IJ line, Swan-Ganz catheter, Impella device
in unchanged position. Heart size stable. Bilateral pulmonary
infiltrates/edema, right side greater than left again noted without
interim change. No prominent pleural effusion. No pneumothorax.
IMPRESSION: 1.  Lines and tubes in unchanged position.

2. Bilateral pulmonary infiltrates/edema right side greater than
left. No interim change from prior exam.

## 2021-05-28 IMAGING — DX PORTABLE CHEST - 1 VIEW
1 series · 2 of 2 positions shown · non-contrast
Comparison: Radiograph April 02, 2019.

CLINICAL DATA: Respiratory failure.

EXAM:
PORTABLE CHEST 1 VIEW

[Series 1: chest · 0.14mm/px · 2 of 2 slices shown]
[im 1/2]
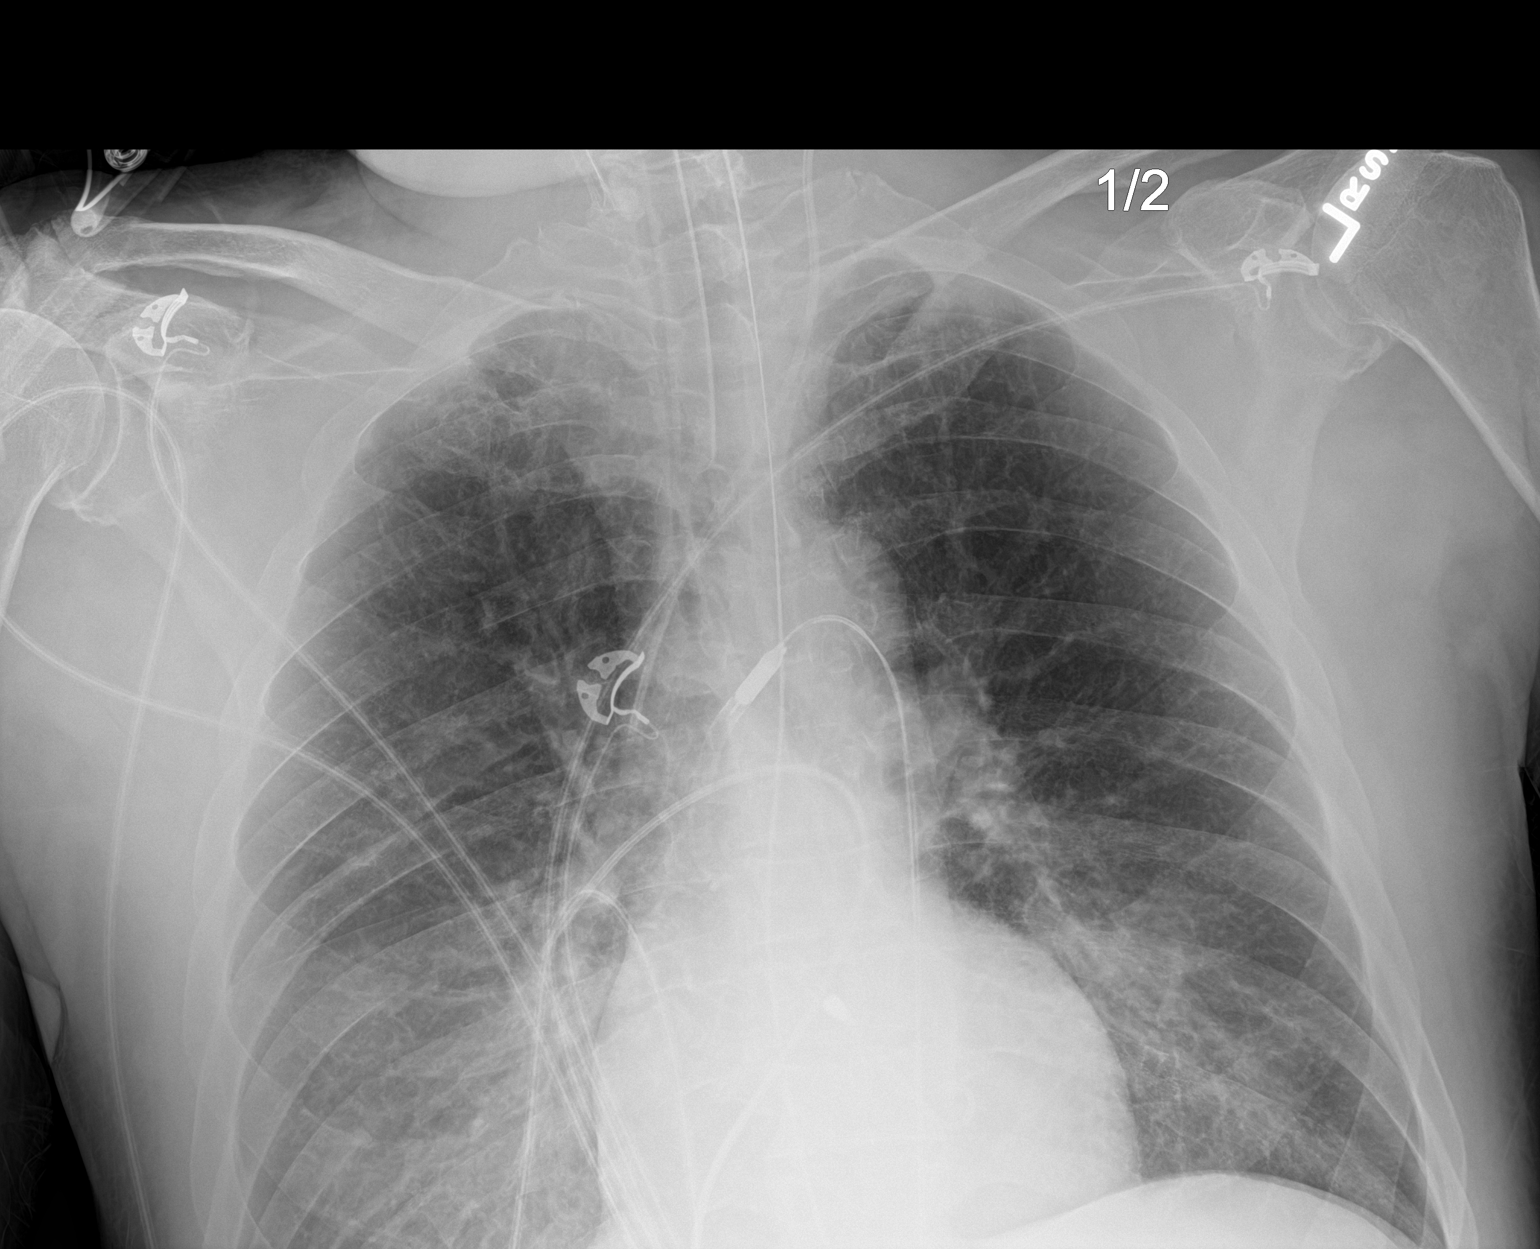
[im 2/2]
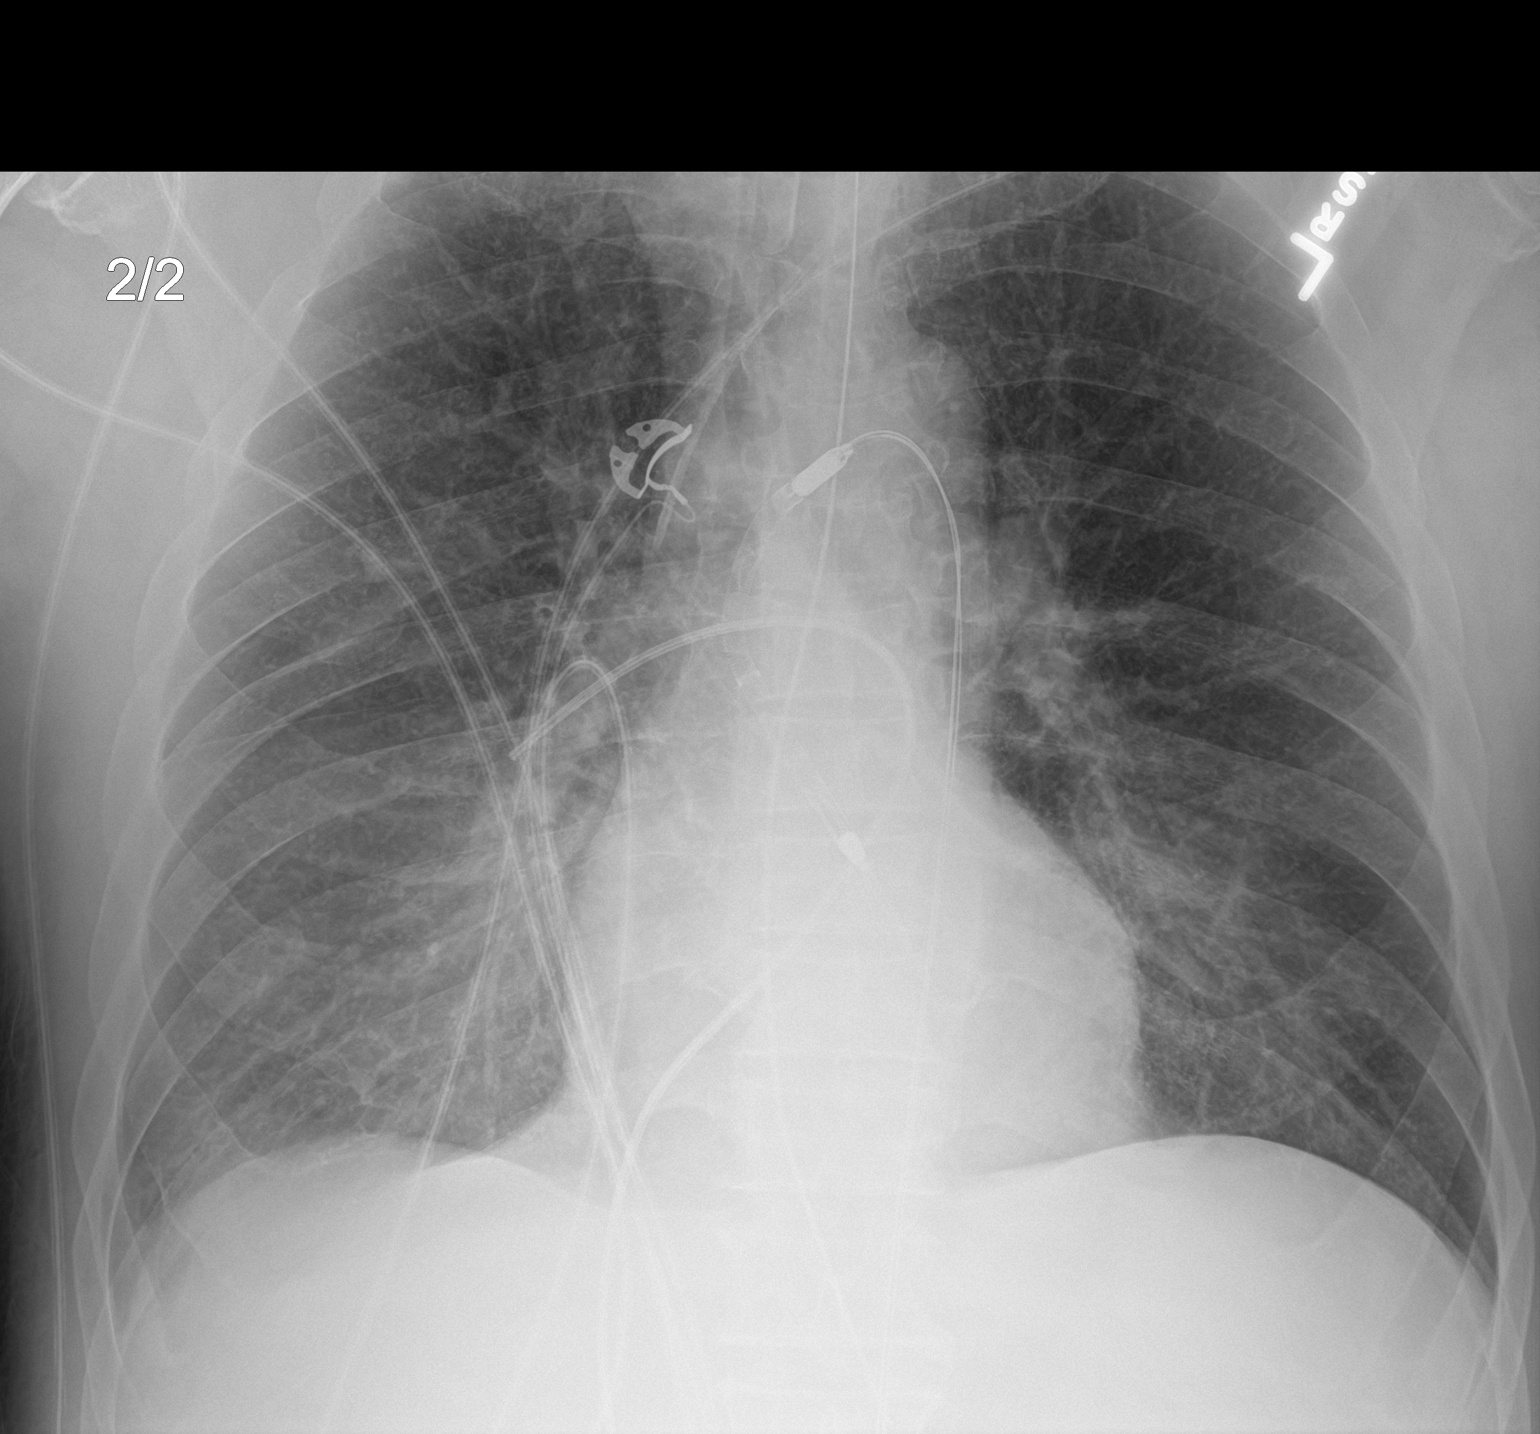

[2 of 2 positions shown; findings below may reference images not displayed]

FINDINGS: The heart size and mediastinal contours are within normal limits.
Endotracheal and nasogastric tubes are unchanged in position. Left
internal jugular catheter is unchanged in position. Impella device
is unchanged in position. No pneumothorax or significant pleural
effusion is noted. Minimal bibasilar subsegmental atelectasis is
noted. The visualized skeletal structures are unremarkable.
IMPRESSION: Stable support apparatus. Minimal bibasilar subsegmental
atelectasis.

## 2021-05-29 IMAGING — DX CHEST  1 VIEW
1 series · 1 of 1 positions shown · non-contrast
Comparison: Radiograph 04/03/2019

CLINICAL DATA: Inpatient encounter for pulmonary line advancement

EXAM:
CHEST  1 VIEW

[chest ap]
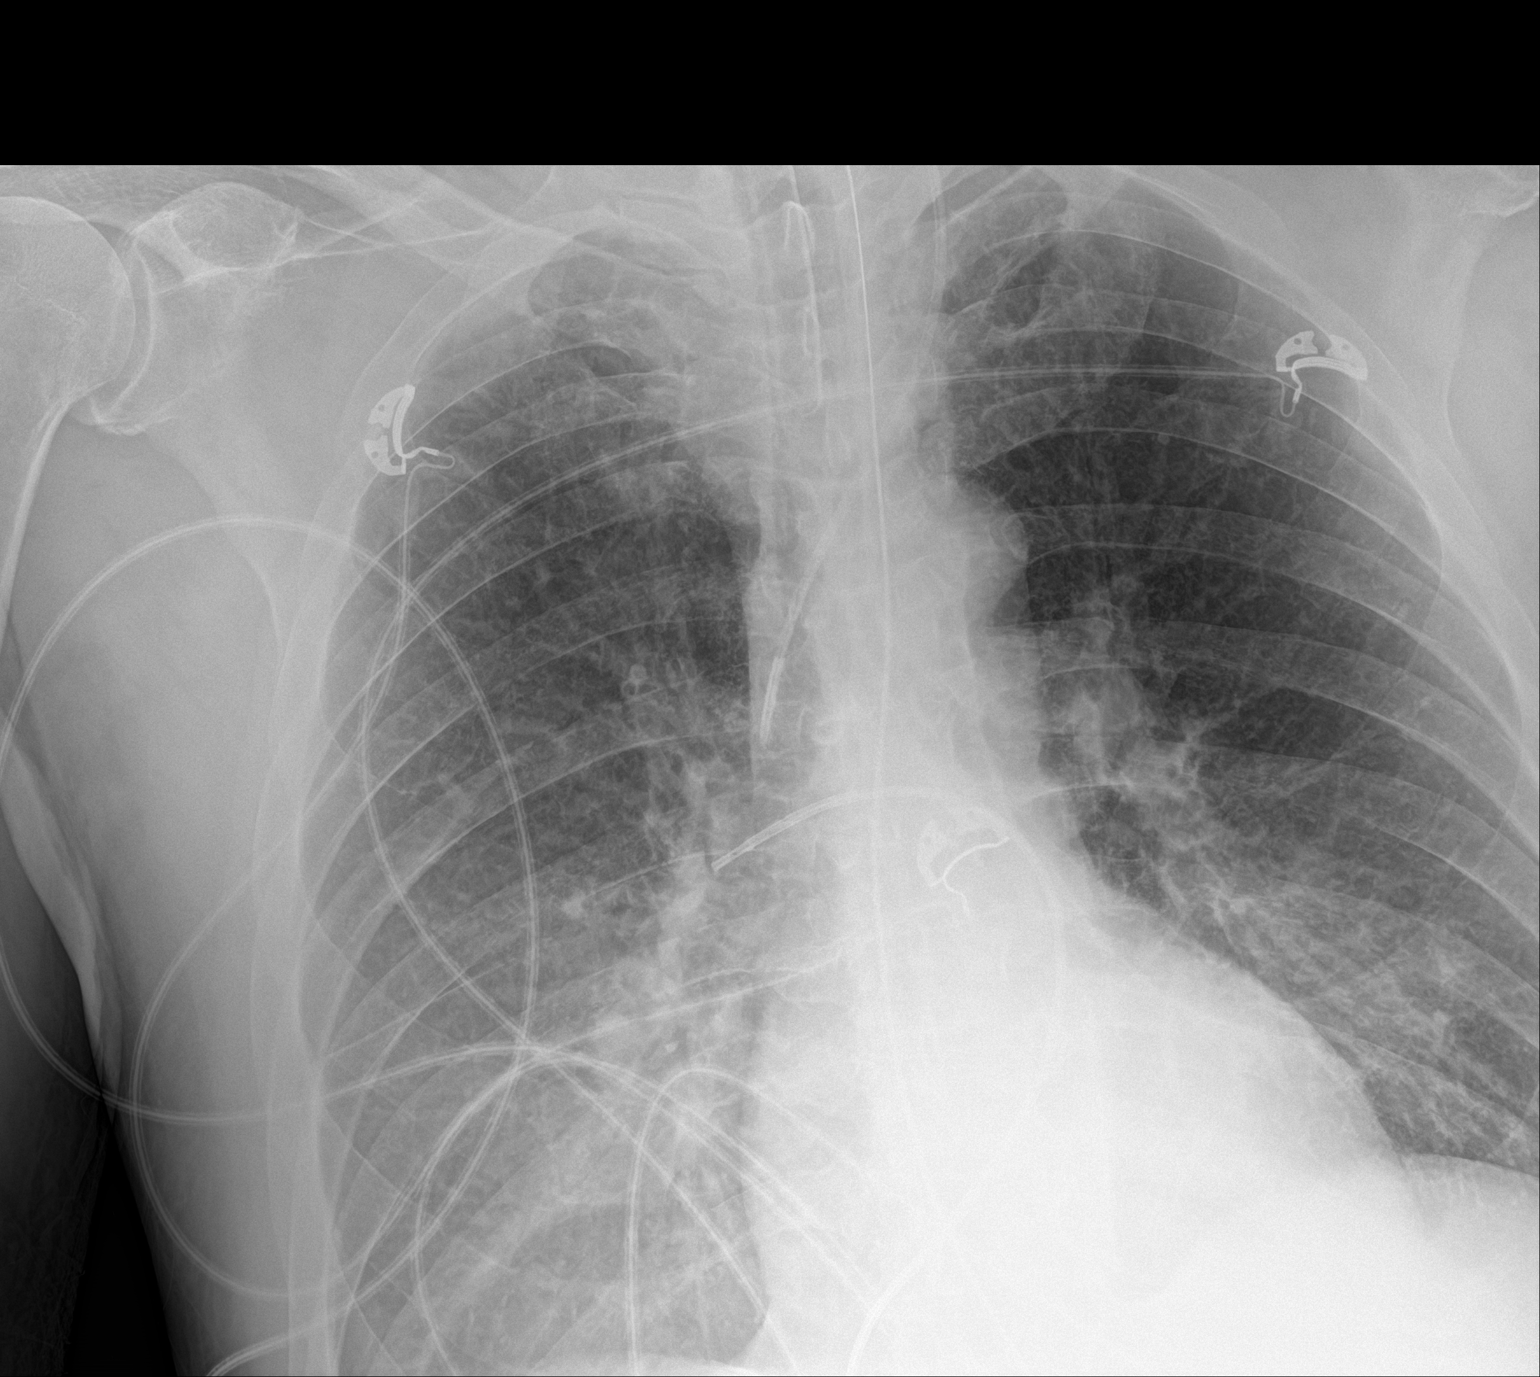

[1 of 1 positions shown; findings below may reference images not displayed]

FINDINGS: Impella device removed from field of view. Swan-Ganz catheter in the
RIGHT pulmonary artery. Endotracheal tube unchanged in position
cm from carina. NG tube unchanged. Central venous line tip in the
distal SVC. Mild central venous pulmonary congestion. Mild perihilar
airspace disease. No pneumothorax.
IMPRESSION: 1. Impella device removed.  Otherwise stable support apparatus.
2. Perihilar congestion and mild airspace disease unchanged.

## 2021-05-30 ENCOUNTER — Other Ambulatory Visit: Payer: Self-pay | Admitting: Physician Assistant

## 2021-05-30 DIAGNOSIS — J439 Emphysema, unspecified: Secondary | ICD-10-CM

## 2021-05-30 IMAGING — DX PORTABLE CHEST - 1 VIEW
1 series · 1 of 1 positions shown · non-contrast
Comparison: Single-view of the chest 04/04/2019 and 04/03/2019.

CLINICAL DATA: Patient status post MI with cardiogenic shock.
Intubated.

EXAM:
PORTABLE CHEST 1 VIEW

[chest ap]
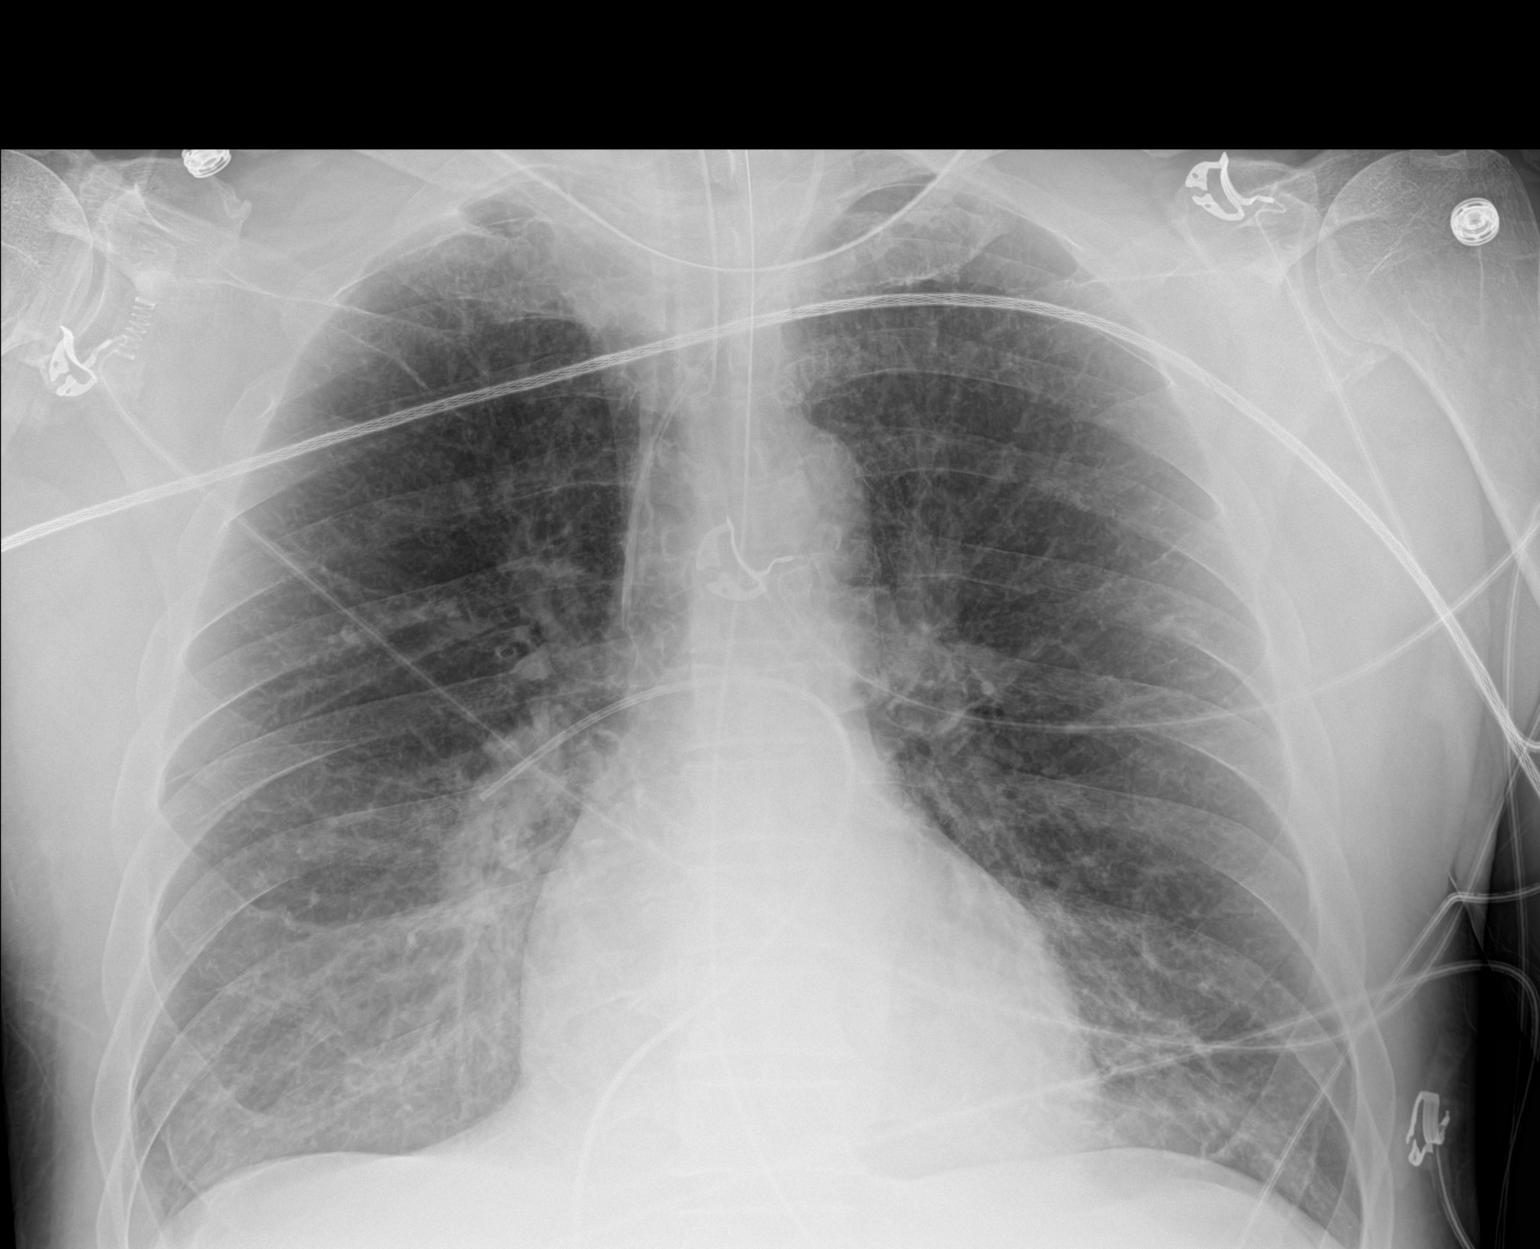

[1 of 1 positions shown; findings below may reference images not displayed]

FINDINGS: Swan-Ganz catheter has been advanced slightly with its tip now
position in the descending right interlobar pulmonary artery.
Endotracheal tube and left IJ central venous catheter projecting
good position. Hazy basilar airspace opacities greater on the left
are again seen. No pneumothorax or pleural effusion. Lungs appear
emphysematous. Heart size is normal.
IMPRESSION: Swan-Ganz catheter tip is now in the descending right interlobar
pulmonary artery.

Mild, hazy basilar airspace opacities are greater on the right and
most compatible with atelectasis.

Emphysema.

## 2021-06-16 ENCOUNTER — Other Ambulatory Visit: Payer: Self-pay

## 2021-06-16 NOTE — Patient Outreach (Signed)
Medicaid Managed Care   Nurse Care Manager Note  06/16/2021 Name:  Stephen Jennings MRN:  [id number] DOB:  01-Sep-1955  Stephen Jennings is an 65 y.o. year old male who is Jennings primary patient of Stephen Pier, Jennings.  The [facility] Managed Care Coordination team was consulted for assistance with:    CHF HTN HLD COPD DMII  Stephen Jennings was given information about Medicaid Managed Care Coordination team services today. Stephen Jennings Patient agreed to services and verbal consent obtained.  Engaged with patient by telephone for initial visit in response to provider referral for case management and/or care coordination services.   Assessments/Interventions:  Review of past medical history, allergies, medications, health status, including review of consultants reports, laboratory and other test data, was performed as part of comprehensive evaluation and provision of chronic care management services.  SDOH (Social Determinants of Health) assessments and interventions performed: SDOH Interventions    Flowsheet Row Most Recent Value  SDOH Interventions   Food Insecurity Interventions Intervention Not Indicated  Financial Strain Interventions Intervention Not Indicated  Housing Interventions Intervention Not Indicated  Physical Activity Interventions Cardiac Rehab  Stress Interventions Intervention Not Indicated  Social Connections Interventions Intervention Not Indicated  Transportation Interventions Intervention Not Indicated       Care Plan  Allergies  Allergen Reactions   Atorvastatin     Muscle weakness and fatigue on 19m daily   Spironolactone     gynecomastia   Metformin And Related Anxiety and Palpitations   Other Anxiety and Palpitations    Medications Reviewed Today     Reviewed by Stephen Rise RN (Case Manager) on 06/16/21 at [place] Status: <None>   Medication Order Taking? Sig Documenting Provider Last Dose Status Informant  Accu-Chek Softclix Lancets  lancets [id number] Use to measure blood sugar twice Jennings day JLadell Pier Jennings Taking Active Self  albuterol (ACCUNEB) 0.63 MG/3ML nebulizer solution [id number] Take 3 mLs (0.63 mg total) by nebulization every 6 (six) hours as needed for wheezing. [name], Stephen Jennings Taking Active Self  albuterol (VENTOLIN HFA) 108 (90 Base) MCG/ACT inhaler [id number] Inhale 2 puffs into the lungs every 6 (six) hours as needed for wheezing or shortness of breath. JLadell Pier Jennings Taking Active   aspirin EC 81 MG tablet [id number] Take 1 tablet (81 mg total) by mouth daily. Swallow whole. Stephen Bottcher Jennings Taking Active   bisoprolol (ZEBETA) 5 MG tablet [id number] Take 1 tablet (5 mg total) by mouth daily. Stephen Jennings Taking Active   Blood Glucose Monitoring Suppl (ACCU-CHEK GUIDE ME) w/Device KIT [id number] 1 kit by Does not apply route in the morning and at bedtime. Use to measure blood sugar twice Jennings day  Patient not taking: Reported on 06/16/2021   JLadell Pier Jennings Not Taking Active Self           Med Note ([facility] MGwenette GreetA   Wed Jun 16, 2021  3:26 PM) Checking blood sugar once daily in AM (FBS)  co-enzyme Q-10 30 MG capsule [id number] Take 1 capsule (30 mg total) by mouth daily. MBethel JMaricela Bo Jennings Taking Active   empagliflozin (JARDIANCE) 10 MG TABS tablet [id number] Take 1 tablet (10 mg total) by mouth daily before breakfast. Stephen Jennings Taking Active   eplerenone (INSPRA) 25 MG tablet [id number] Take 0.5 tablets (12.5 mg total) by mouth daily. [place] JMaricela Bo Jennings Taking Active   ezetimibe (  ZETIA) 10 MG tablet [id number] Yes Take 1 tablet (10 mg total) by mouth daily. [name], Jennings Taking Active   fluticasone [facility]) 50 MCG/ACT nasal spray [id number] Yes Place 1 spray into both nostrils daily as needed for allergies or rhinitis. Stephen Pier, Jennings Taking Active Self  furosemide (LASIX) 40 MG tablet [id number] Yes Take  1 tablet 40 mg prn for weight gain of 3 lbs in 24 hours or 5 lbs in Jennings week Stephen Jennings Taking Active   glucose blood (ACCU-CHEK GUIDE) test strip [id number] No Use to measure blood sugar twice Jennings day  Patient not taking: Reported on 06/16/2021   Stephen Pier, Jennings Not Taking Active Self           Med Note [facility], Stephen Jennings   Wed Jun 16, 2021  3:27 PM) Checking blood sugar once daily in AM (FBS)  Hyprom-Naphaz-Polysorb-Zn Sulf (CLEAR EYES COMPLETE) SOLN [id number] Yes Apply 1 drop to eye 3 (three) times daily. Stephen Jennings Taking Active Self  loratadine (CLARITIN) 10 MG tablet [id number] Yes Take 1 tablet (10 mg total) by mouth daily. Stephen Jennings Taking Active Self  mometasone-formoterol (DULERA) 100-5 MCG/ACT AERO [id number] Yes Inhale 2 puffs into the lungs 2 (two) times daily. Stephen Pier, Jennings Taking Active   montelukast (SINGULAIR) 10 MG tablet [id number] Yes TAKE 1 TABLET BY MOUTH AT BEDTIME Stephen Jennings Taking Active   nitroGLYCERIN (NITROSTAT) 0.4 MG SL tablet [id number] Yes Place 1 tablet (0.4 mg total) under the tongue every 5 (five) minutes x 3 doses as needed for chest pain. [name], Jennings Taking Active   omeprazole (PRILOSEC) 20 MG capsule [id number] Yes Take 1 capsule by mouth once daily Stephen Pier, Jennings Taking Active   pravastatin (PRAVACHOL) 80 MG tablet [id number] Yes Take 1 tablet (80 mg total) by mouth every evening. Stephen Jennings Taking Active   sacubitril-valsartan (ENTRESTO) 97-103 MG [id number] Yes Take 1 tablet by mouth 2 (two) times daily. Stephen Jennings Taking Active   sodium zirconium cyclosilicate (LOKELMA) 5 g packet [id number] Yes Take 10 g by mouth daily. Stephen Jennings Taking Active            Med Note [facility], Stephen Jennings   Wed Jun 16, 2021  3:29 PM)    ticagrelor (BRILINTA) 90 MG TABS tablet [id number] Yes Take 1 tablet (90 mg total) by mouth 2 (two) times daily. [name], Jennings  Taking Active   tiotropium [facility] HANDIHALER) 18 MCG inhalation capsule [id number] No Place 1 capsule (18 mcg total) into inhaler and inhale daily.  Patient not taking: Reported on 06/16/2021   Stephen Pier, Jennings Not Taking Active            Med Note [facility], Erskin Burnet Jun 16, 2021  3:30 PM) Using PRN  traZODone (DESYREL) 50 MG tablet [id number] Yes TAKE 1/2 TO 1 TABLET BY MOUTH AT BEDTIME Stephen Pier, Jennings Taking Active             Patient Active Problem List   Diagnosis Date Noted   Hyperlipidemia 02/06/2021   Elevated troponin    NSTEMI (non-ST elevated myocardial infarction) ([place]) 02/01/2021   COPD with acute exacerbation ([place]) 01/20/2021   Non-seasonal allergic rhinitis 01/20/2021   Pulmonary emphysema ([place]) 12/03/2020   SOB (shortness of breath) 12/02/2020   Upper respiratory tract infection 12/02/2020   History of tobacco abuse  00/17/4944   Chronic systolic heart failure ([place]) 10/05/2020   Coronary artery disease of native artery of native heart with stable angina pectoris ([place]) 10/05/2020   Gastroesophageal reflux disease without esophagitis 10/05/2020   Prediabetes 10/05/2020   Essential hypertension 06/08/2020   Obesity (BMI 30.0-34.9) 06/08/2020   Snoring 11/28/2019   Morning headache 11/28/2019   Chronic daily headache 10/08/2019   Coronary artery disease involving left main coronary artery 05/08/2019   History of placement of stent in LAD coronary artery 05/08/2019    Conditions to be addressed/monitored per PCP order:  CHF, HTN, HLD, COPD, and DMII  Care Plan : RN Care Manager Plan of Care  Updates made by Inge Rise, RN since 06/16/2021 12:00 AM     Problem: Chronic Disease Management and Care Coordination Needs for HF, HTN, HLD, DMII and COPD   Priority: High     Long-Range Goal: Development of Plan of Care for Chronic Disease Management and Care Coordination Needs (HF, HTN, HLD, DMII, COPD)   Start Date: 06/16/2021   Expected End Date: 10/14/2021  Priority: High  Note:   Current Barriers:  Knowledge Deficits related to plan of care for management of CHF, HTN, HLD, COPD, and DMII Care Coordination needs related to CHF, HTN, HLD, COPD and DMII Chronic Disease Management support and education needs related to CHF, HTN, HLD, COPD, and DMII No Advanced Directives in place  RNCM Clinical Goal(Jennings):  Patient will verbalize understanding of plan for management of CHF, HTN, HLD, COPD, and DMII verbalize basic understanding of  CHF, HTN, HLD, COPD, and DMII disease process and self health management plan   take all medications exactly as prescribed and will call provider for medication related questions attend all scheduled medical appointments: 07/27/21 Echocardiogram followed by Dr. Haroldine Laws visit; 08/04/21 Office visit w/Dr. Shelva Majestic demonstrate Ongoing adherence to prescribed treatment plan for CHF, HTN, HLD, COPD, and DMII as evidenced by blood pressure and blood sugars readings WNL and lab results WNL. demonstrate Ongoing health management independence   continue to work with RN Care Manager to address care management and care coordination needs related to  CHF, HTN, HLD, COPD, and DMII work with pharmacist to address complex medication regimen related toCHF, HTN, HLD, COPD, and DMII through collaboration with RN Care manager, provider, and care team.   Interventions: Inter-disciplinary care team collaboration (see longitudinal plan of care) Evaluation of current treatment plan related to  self management and patient'Jennings adherence to plan as established by provider  Hypertension Interventions: Last practice recorded BP readings:  BP Readings from Last 3 Encounters:  05/25/21 140/76  04/27/21 120/78  04/23/21 118/80  Most recent eGFR/CrCl:  Lab Results  Component Value Date   EGFR 66 03/04/2021    No components found for: CRCL  Evaluation of current treatment plan related to hypertension self  management and patient'Jennings adherence to plan as established by provider; Reviewed medications with patient and discussed importance of compliance; Counseled on the importance of exercise goals with target of 150 minutes per week Discussed plans with patient for ongoing care management follow up and provided patient with direct contact information for care management team; Advised patient, providing education and rationale, to monitor blood pressure daily and record, calling PCP for findings outside established parameters;  Reviewed scheduled/upcoming provider appointments including:  Screening for signs and symptoms of depression related to chronic disease state;  Assessed social determinant of health barriers;  Hyperlipidemia Interventions: Lab Results  Component Value Date   CHOL 221 (  H) 02/02/2021   HDL 53 02/02/2021   LDLCALC 150 (H) 02/02/2021   TRIG 89 02/02/2021   CHOLHDL 4.2 02/02/2021    Medication review performed; medication list updated in electronic medical record.  Provider established cholesterol goals reviewed; Counseled on importance of regular laboratory monitoring as prescribed; Reviewed role and benefits of statin for ASCVD risk reduction; Reviewed importance of limiting foods high in cholesterol; Reviewed exercise goals and target of 150 minutes per week; Screening for signs and symptoms of depression related to chronic disease state;  Assessed social determinant of health barriers;   Diabetes Interventions: Assessed patient'Jennings understanding of A1c goal: <7% Provided education to patient about basic DM disease process; Reviewed medications with patient and discussed importance of medication adherence; Counseled on importance of regular laboratory monitoring as prescribed; Discussed plans with patient for ongoing care management follow up and provided patient with direct contact information for care management team; Reviewed scheduled/upcoming provider appointments  including: See future appointments above under Clinical Goals; Advised patient, providing education and rationale, to check cbg daily and record, calling PCP for findings outside established parameters; Referral made to pharmacy team for assistance with complex medication regimen; Review of patient status, including review of consultants reports, relevant laboratory and other test results, and medications completed; Screening for signs and symptoms of depression related to chronic disease state;  Assessed social determinant of health barriers;  Lab Results  Component Value Date   HGBA1C 6.4 (H) 02/02/2021   COPD Interventions:   Advised patient to track and manage COPD triggers;  Provided instruction about proper use of medications used for management of COPD including inhalers; Discussed the importance of adequate rest and management of fatigue with COPD; Screening for signs and symptoms of depression related to chronic disease state;  Assessed social determinant of health barriers;   Heart Failure Interventions: Basic overview and discussion of pathophysiology of Heart Failure reviewed; Advised patient to weigh each morning after emptying bladder; Discussed importance of daily weight and advised patient to weigh and record daily; Reviewed role of diuretics in prevention of fluid overload and management of heart failure; Discussed the importance of keeping all appointments with provider; Provided patient with education about the role of exercise in the management of heart failure; Screening for signs and symptoms of depression related to chronic disease state;  Assessed social determinant of health barriers;   Patient Goals/Self-Care Activities: Patient will self administer medications as prescribed Patient will attend all scheduled provider appointments Patient will call pharmacy for medication refills Patient will continue to perform ADL'Jennings independently Patient will continue to  perform IADL'Jennings independently Patient will call provider office for new concerns or questions Patient will work with [facility] pharmacist to address complex medication regimen  Follow Up Plan:  Telephone follow up appointment with care management team member scheduled for:  07/14/2021 at 2:00 pm The patient has been provided with contact information for the care management team and has been advised to call with any health related questions or concerns.  The care management team will reach out to the patient again over the next 30 days.      Follow Up:  Patient agrees to Care Plan and Follow-up.  Plan: The Managed Medicaid care management team will reach out to the patient again over the next 30 days.  Date/time of next scheduled RN care management/care coordination outreach:  07/14/2021 at 2:00 pm  Salvatore Marvel RN, [facility] Mobile: [phone]

## 2021-06-16 NOTE — Patient Instructions (Signed)
Visit Information  Stephen Jennings was given information about Medicaid Managed Care team care coordination services as a part of their Healthy [facility] Medicaid benefit. Stephen Jennings verbally consented to engagement with the [facility] Managed Care team.   If you are experiencing a medical emergency, please call 911 or report to your local emergency department or urgent care.   If you have a non-emergency medical problem during routine business hours, please contact your provider's office and ask to speak with a nurse.   For questions related to your Healthy [facility] health plan, please call: [phone] or visit the homepage here: [email or web address]  If you would like to schedule transportation through your Healthy [facility] plan, please call the following number at least 2 days in advance of your appointment: [phone]  Call the [place] at [phone], at any time, 24 hours a day, 7 days a week. If you are in danger or need immediate medical attention call 911.  If you would like help to quit smoking, call 1-800-QUIT-NOW (760)542-2964) OR Espaol: 1-855-Djelo-Ya (9-476-546-5035) o para ms informacin haga clic aqu or Text READY to 200-400 to register via text  Mr. Kelley - following are the goals we discussed in your visit today:   Goals Addressed   None     Please see education materials related to today's visit provided as print materials.   The patient verbalized understanding of instructions provided today and agreed to receive a mailed copy of patient instruction and/or educational materials.  The Managed Medicaid care management team will reach out to the patient again over the next 30 days.   Salvatore Marvel RN, BSN Community Care Coordinator [facility] Mobile: [phone]   Following is a copy of your plan of care:  Care Plan : [place] of Care  Updates made  by Inge Rise, RN since 06/16/2021 12:00 AM     Problem: Chronic Disease Management and Care Coordination Needs for HF, HTN, HLD, DMII and COPD   Priority: High     Long-Range Goal: Development of Plan of Care for Chronic Disease Management and Care Coordination Needs (HF, HTN, HLD, DMII, COPD)   Start Date: 06/16/2021  Expected End Date: 10/14/2021  Priority: High  Note:   Current Barriers:  Knowledge Deficits related to plan of care for management of CHF, HTN, HLD, COPD, and DMII Care Coordination needs related to CHF, HTN, HLD, COPD and DMII Chronic Disease Management support and education needs related to CHF, HTN, HLD, COPD, and DMII No Advanced Directives in place  RNCM Clinical Goal(s):  Patient will verbalize understanding of plan for management of CHF, HTN, HLD, COPD, and DMII verbalize basic understanding of  CHF, HTN, HLD, COPD, and DMII disease process and self health management plan   take all medications exactly as prescribed and will call provider for medication related questions attend all scheduled medical appointments: 07/27/21 Echocardiogram followed by Dr. Haroldine Laws visit; 08/04/21 Office visit w/Dr. Shelva Majestic demonstrate Ongoing adherence to prescribed treatment plan for CHF, HTN, HLD, COPD, and DMII as evidenced by blood pressure and blood sugars readings WNL and lab results WNL. demonstrate Ongoing health management independence   continue to work with RN Care Manager to address care management and care coordination needs related to  CHF, HTN, HLD, COPD, and DMII work with pharmacist to address complex medication regimen related toCHF, HTN, HLD, COPD, and DMII through collaboration with RN Care manager, provider, and care team.  Interventions: Inter-disciplinary care team collaboration (see longitudinal plan of care) Evaluation of current treatment plan related to  self management and patient's adherence to plan as established by  provider  Hypertension Interventions: Last practice recorded BP readings:  BP Readings from Last 3 Encounters:  05/25/21 140/76  04/27/21 120/78  04/23/21 118/80  Most recent eGFR/CrCl:  Lab Results  Component Value Date   EGFR 66 03/04/2021    No components found for: CRCL  Evaluation of current treatment plan related to hypertension self management and patient's adherence to plan as established by provider; Reviewed medications with patient and discussed importance of compliance; Counseled on the importance of exercise goals with target of 150 minutes per week Discussed plans with patient for ongoing care management follow up and provided patient with direct contact information for care management team; Advised patient, providing education and rationale, to monitor blood pressure daily and record, calling PCP for findings outside established parameters;  Reviewed scheduled/upcoming provider appointments including:  Screening for signs and symptoms of depression related to chronic disease state;  Assessed social determinant of health barriers;  Hyperlipidemia Interventions: Lab Results  Component Value Date   CHOL 221 (H) 02/02/2021   HDL 53 02/02/2021   LDLCALC 150 (H) 02/02/2021   TRIG 89 02/02/2021   CHOLHDL 4.2 02/02/2021    Medication review performed; medication list updated in electronic medical record.  Provider established cholesterol goals reviewed; Counseled on importance of regular laboratory monitoring as prescribed; Reviewed role and benefits of statin for ASCVD risk reduction; Reviewed importance of limiting foods high in cholesterol; Reviewed exercise goals and target of 150 minutes per week; Screening for signs and symptoms of depression related to chronic disease state;  Assessed social determinant of health barriers;   Diabetes Interventions: Assessed patient's understanding of A1c goal: <7% Provided education to patient about basic DM disease  process; Reviewed medications with patient and discussed importance of medication adherence; Counseled on importance of regular laboratory monitoring as prescribed; Discussed plans with patient for ongoing care management follow up and provided patient with direct contact information for care management team; Reviewed scheduled/upcoming provider appointments including: See future appointments above under Clinical Goals; Advised patient, providing education and rationale, to check cbg daily and record, calling PCP for findings outside established parameters; Referral made to pharmacy team for assistance with complex medication regimen; Review of patient status, including review of consultants reports, relevant laboratory and other test results, and medications completed; Screening for signs and symptoms of depression related to chronic disease state;  Assessed social determinant of health barriers;  Lab Results  Component Value Date   HGBA1C 6.4 (H) 02/02/2021   COPD Interventions:   Advised patient to track and manage COPD triggers;  Provided instruction about proper use of medications used for management of COPD including inhalers; Discussed the importance of adequate rest and management of fatigue with COPD; Screening for signs and symptoms of depression related to chronic disease state;  Assessed social determinant of health barriers;   Heart Failure Interventions: Basic overview and discussion of pathophysiology of Heart Failure reviewed; Advised patient to weigh each morning after emptying bladder; Discussed importance of daily weight and advised patient to weigh and record daily; Reviewed role of diuretics in prevention of fluid overload and management of heart failure; Discussed the importance of keeping all appointments with provider; Provided patient with education about the role of exercise in the management of heart failure; Screening for signs and symptoms of depression  related to chronic disease state;  Assessed social determinant of health barriers;   Patient Goals/Self-Care Activities: Patient will self administer medications as prescribed Patient will attend all scheduled provider appointments Patient will call pharmacy for medication refills Patient will continue to perform ADL's independently Patient will continue to perform IADL's independently Patient will call provider office for new concerns or questions Patient will work with [facility] pharmacist to address complex medication regimen  Follow Up Plan:  Telephone follow up appointment with care management team member scheduled for:  07/14/2021 at 2:00 pm The patient has been provided with contact information for the care management team and has been advised to call with any health related questions or concerns.  The care management team will reach out to the patient again over the next 30 days.

## 2021-07-01 ENCOUNTER — Other Ambulatory Visit: Payer: Self-pay | Admitting: Internal Medicine

## 2021-07-01 DIAGNOSIS — J3089 Other allergic rhinitis: Secondary | ICD-10-CM

## 2021-07-04 ENCOUNTER — Other Ambulatory Visit: Payer: Self-pay | Admitting: Physician Assistant

## 2021-07-04 DIAGNOSIS — J439 Emphysema, unspecified: Secondary | ICD-10-CM

## 2021-07-14 ENCOUNTER — Other Ambulatory Visit: Payer: Self-pay

## 2021-07-14 NOTE — Patient Instructions (Signed)
Kathleen Argue ,   The [facility] Managed Care Team is available to provide assistance to you with your healthcare needs at no cost and as a benefit of your [facility] Health plan. I'm sorry I was unable to reach you today for our scheduled appointment. Our care guide will call you to reschedule our telephone appointment. Please call me at the number below. I am available to be of assistance to you regarding your healthcare needs. .   Thank you,   Salvatore Marvel RN, BSN [facility] Coordinator [place] [facility] Mobile: [phone]

## 2021-07-14 NOTE — Patient Outreach (Signed)
Care Coordination  07/14/2021  Kennth Vanbenschoten 10-Jan-1956 [id number]  07/14/2021 Name: Velton Roselle MRN: [id number] DOB: 10-13-1955  Referred by: Ladell Pier, MD Reason for referral : High Risk Managed Medicaid (Unsuccessful Telephone Outreach)   An unsuccessful telephone outreach was attempted today. The patient was referred to the case management team for assistance with care management and care coordination.    Follow Up Plan: A HIPAA compliant phone message was left for the patient providing contact information and requesting a return call.  The Managed Medicaid care management team will reach out to the patient again over the next 14 days.    Salvatore Marvel RN, BSN Community Care Coordinator [facility] Mobile: [phone]

## 2021-07-17 ENCOUNTER — Other Ambulatory Visit: Payer: Self-pay | Admitting: Physician Assistant

## 2021-07-17 DIAGNOSIS — J441 Chronic obstructive pulmonary disease with (acute) exacerbation: Secondary | ICD-10-CM

## 2021-07-19 ENCOUNTER — Other Ambulatory Visit: Payer: Self-pay

## 2021-07-19 ENCOUNTER — Ambulatory Visit: Payer: Medicare Other | Attending: Physician Assistant | Admitting: Physician Assistant

## 2021-07-19 ENCOUNTER — Encounter: Payer: Self-pay | Admitting: Physician Assistant

## 2021-07-19 ENCOUNTER — Ambulatory Visit: Payer: Self-pay | Admitting: *Deleted

## 2021-07-19 VITALS — BP 109/74 | HR 94 | Temp 98.6°F | Resp 20 | Ht 68.0 in | Wt 199.0 lb

## 2021-07-19 DIAGNOSIS — I10 Essential (primary) hypertension: Secondary | ICD-10-CM

## 2021-07-19 DIAGNOSIS — J441 Chronic obstructive pulmonary disease with (acute) exacerbation: Secondary | ICD-10-CM

## 2021-07-19 DIAGNOSIS — E1159 Type 2 diabetes mellitus with other circulatory complications: Secondary | ICD-10-CM

## 2021-07-19 MED ORDER — PREDNISONE 20 MG PO TABS: 40.0000 mg | ORAL_TABLET | Freq: Every day | ORAL | 0 refills | Status: AC

## 2021-07-19 MED ORDER — AZITHROMYCIN 250 MG PO TABS: ORAL_TABLET | ORAL | 0 refills | Status: DC

## 2021-07-19 NOTE — Patient Instructions (Signed)
You are going to take prednisone 40 mg for 10 days.  You will also take azithromycin as directed.  I do encourage you to continue your current regimen, and do breathing treatments twice a day.  I hope that you feel better soon, please let us know if there is anything else we can do for you  Kennieth Rad, [name] Physician Assistant [facility] [email or web address]   Chronic Obstructive Pulmonary Disease Exacerbation Chronic obstructive pulmonary disease (COPD) is a long-term (chronic) condition that affects the lungs. COPD is a general term that can be used to describe many different lung problems that cause lung inflammation and limit airflow, including chronic bronchitis and emphysema. COPD exacerbations are episodes when breathing symptoms flare up, become much worse, and require extra treatment. COPD exacerbations are usually caused by infections. Without treatment, COPD exacerbations can be severe and even life threatening. Frequent COPD exacerbations can cause further damage to the lungs. What are the causes? This condition may be caused by: Respiratory infections, including viral and bacterial infections. Exposure to smoke. Exposure to air pollution, chemical fumes, or dust. Things that can cause an allergic reaction (allergens). Not taking your usual COPD medicines as directed. Underlying medical problems, such as congestive heart failure or infections not involving the lungs. In many cases, the cause of this condition is not known. What increases the risk? The following factors may make you more likely to develop this condition: Smoking cigarettes. Being an older adult. Having frequent prior COPD exacerbations. What are the signs or symptoms? Symptoms of this condition include: Increased coughing. Increased production of mucus from your lungs. Increased wheezing and shortness of breath. Rapid or labored breathing. Chest  tightness. Less energy than usual. Sleep disruption from symptoms. Confusion Increased sleepiness. Often, these symptoms happen or get worse even with the use of medicines. How is this diagnosed? This condition is diagnosed based on: Your medical history. A physical exam. You may also have tests, including: A chest X-ray. Blood tests. Lung (pulmonary) function tests. How is this treated? Treatment for this condition depends on the severity and cause of the symptoms. You may need to be admitted to a hospital for treatment. Some of the treatments commonly used to treat COPD exacerbations are: Antibiotic medicines. These may be used for severe exacerbations caused by a lung infection, such as pneumonia. Bronchodilators. These are inhaled medicines that expand the air passages and allow increased airflow. They may make your breathing more comfortable. Steroid medicines. These act to reduce inflammation in the airways. They may be given with an inhaler, taken by mouth, or given through an IV tube inserted into one of your veins. Supplemental oxygen therapy. Airway clearing techniques, such as noninvasive ventilation (NIV) and positive expiratory pressure (PEP). These provide respiratory support through a mask or other noninvasive device. An example of this would be using a continuous positive airway pressure (CPAP) machine to improve delivery of oxygen into your lungs. Follow these instructions at home: Medicines Take over-the-counter and prescription medicines only as told by your health care provider. It is important to use correct technique with inhaled medicines. If you were prescribed an antibiotic medicine or oral steroid, take it as told by your health care provider. Do not stop taking the medicine even if you start to feel better. Lifestyle Do not use any products that contain nicotine or tobacco. These products include cigarettes, chewing tobacco, and vaping devices, such as  e-cigarettes. If you need help quitting, ask your health care provider. Eat  a healthy diet. Exercise regularly. Get enough sleep. Most adults need 7 or more hours per night. Avoid exposure to all substances that irritate the airway, especially tobacco smoke. Regularly wash your hands with soap and water for at least 20 seconds. If soap and water are not available, use hand sanitizer. This may help prevent you from getting infections. During flu season, avoid enclosed spaces that are crowded with people. General instructions Drink enough fluid to keep your urine pale yellow, unless you have a medical condition that requires fluid restriction. Use a cool mist vaporizer. This humidifies the air and makes it easier for you to clear your chest when you cough. If you have a home nebulizer and oxygen, continue to use them as told by your health care provider. Keep all follow-up visits. This is important. How is this prevented? Stay up-to-date on pneumococcal and flu (influenza) vaccines. A flu shot is recommended every year to help prevent exacerbations. Quitting smoking is very important in preventing COPD from getting worse and in preventing exacerbations from happening as often. Follow all instructions for pulmonary rehabilitation after a recent exacerbation. This can help prevent future exacerbations. Work with your health care provider to develop and follow an action plan. This tells you what steps to take when you experience certain symptoms. Contact a health care provider if: You have a worsening of your regular COPD symptoms. Get help right away if: You have worsening shortness of breath, even when resting. You have trouble talking. You have severe chest pain. You cough up blood. You have a fever. You have weakness, vomit repeatedly, or faint. You feel confused. You are not able to sleep because of your symptoms. You have trouble doing daily activities. These symptoms may represent a  serious problem that is an emergency. Do not wait to see if the symptoms will go away. Get medical help right away. Call your local emergency services (911 in the [place] Do not drive yourself to the hospital. Summary COPD exacerbations are episodes when breathing symptoms become much worse and require extra treatment above your normal treatment. Exacerbations can be severe and even life threatening. Frequent COPD exacerbations can cause further damage to your lungs. COPD exacerbations are usually triggered by infections such as the flu, colds, and even pneumonia. Treatment for this condition depends on the severity and cause of the symptoms. You may need to be admitted to a hospital for treatment. Quitting smoking is very important to prevent COPD from getting worse and to prevent exacerbations from happening as often. This information is not intended to replace advice given to you by your health care provider. Make sure you discuss any questions you have with your health care provider. Document Revised: 06/16/2020 Document Reviewed: 06/16/2020 Elsevier Patient Education  2022 [employer].

## 2021-07-19 NOTE — Progress Notes (Signed)
Established Patient Office Visit  Subjective:  Patient ID: Stephen Jennings, male    DOB: Jul 15, 1956  Age: 65 y.o. MRN: [id number]  CC:  Chief Complaint  Patient presents with   Shortness of Breath    COPD/ green sputum    HPI Stephen Jennings reports that he started feeling poorly approximately 3 days ago.  States this feels similar to when he has had COPD exacerbations in the past.  States that he has been having shortness of breath, wheezing, productive cough with green sputum.  Denies any fever, nausea or vomiting, body aches.  Denies any sick contacts.  States that he has been taking his normal COPD regimen without relief.   Past Medical History:  Diagnosis Date   Cardiogenic shock ([place])    CHF (congestive heart failure) (Stephen Jennings)    Coronary artery disease    Diabetes mellitus without complication (Stephen Jennings)    Hyperlipidemia    Medical history non-contributory     Past Surgical History:  Procedure Laterality Date   CARDIAC CATHETERIZATION     CORONARY BALLOON ANGIOPLASTY N/A 02/04/2021   Procedure: CORONARY BALLOON ANGIOPLASTY;  Surgeon: Troy Sine, MD;  Location: [place] [facility];  Service: Cardiovascular;  Laterality: N/A;   CORONARY THROMBECTOMY N/A 02/04/2021   Procedure: Coronary Thrombectomy;  Surgeon: Troy Sine, MD;  Location: [place] [facility];  Service: Cardiovascular;  Laterality: N/A;   CORONARY/GRAFT ACUTE MI REVASCULARIZATION N/A 03/31/2019   Procedure: Coronary/Graft Acute MI Revascularization;  Surgeon: Troy Sine, MD;  Location: [place] [facility];  Service: Cardiovascular;  Laterality: N/A;   ICD IMPLANT N/A 04/15/2019   Procedure: ICD IMPLANT;  Surgeon: Constance Haw, MD;  Location: [place] [facility];  Service: Cardiovascular;  Laterality: N/A;   INTRAVASCULAR ULTRASOUND/IVUS N/A 02/04/2021   Procedure: Intravascular Ultrasound/IVUS;  Surgeon: Troy Sine, MD;  Location: [place] [facility];  Service: Cardiovascular;  Laterality: N/A;   LEFT  HEART CATH AND CORONARY ANGIOGRAPHY N/A 03/31/2019   Procedure: LEFT HEART CATH AND CORONARY ANGIOGRAPHY;  Surgeon: Troy Sine, MD;  Location: [place] [facility];  Service: Cardiovascular;  Laterality: N/A;   LEFT HEART CATH AND CORONARY ANGIOGRAPHY N/A 02/02/2021   Procedure: LEFT HEART CATH AND CORONARY ANGIOGRAPHY;  Surgeon: Troy Sine, MD;  Location: [place] [facility];  Service: Cardiovascular;  Laterality: N/A;   LEFT HEART CATH AND CORONARY ANGIOGRAPHY N/A 02/04/2021   Procedure: LEFT HEART CATH AND CORONARY ANGIOGRAPHY;  Surgeon: Troy Sine, MD;  Location: [place] [facility];  Service: Cardiovascular;  Laterality: N/A;   NO PAST SURGERIES     RIGHT HEART CATH N/A 03/31/2019   Procedure: RIGHT HEART CATH;  Surgeon: Troy Sine, MD;  Location: [place] [facility];  Service: Cardiovascular;  Laterality: N/A;   VENTRICULAR ASSIST DEVICE INSERTION N/A 03/31/2019   Procedure: VENTRICULAR ASSIST DEVICE INSERTION;  Surgeon: Troy Sine, MD;  Location: [place] [facility];  Service: Cardiovascular;  Laterality: N/A;    Family History  Problem Relation Age of Onset   Hypertension Mother    Heart failure Mother    Headache Neg Hx    Migraines Neg Hx     Social History   Socioeconomic History   Marital status: Married    Spouse name: Not on file   Number of children: 1   Years of education: 8   Highest education level: 8th grade  Occupational History   Not on file  Tobacco Use  Smoking status: Former    Types: Cigarettes    Quit date: 04/08/2019    Years since quitting: 2.2    Passive exposure: Current (wife smokes)   Smokeless tobacco: Never  Vaping Use   Vaping Use: Never used  Substance and Sexual Activity   Alcohol use: No   Drug use: No   Sexual activity: Not Currently  Other Topics Concern   Not on file  Social History Narrative   Lives with wife   Right handed   Caffeine: about 2 cups of coffee every morning, no soda   Social Determinants of Health    Financial Resource Strain: Low Risk    Difficulty of Paying Living Expenses: Not very hard  Food Insecurity: No Food Insecurity   Worried About Charity fundraiser in the Last Year: Never true   Ran Out of Food in the Last Year: Never true  Transportation Needs: No Transportation Needs   Lack of Transportation (Medical): No   Lack of Transportation (Non-Medical): No  Physical Activity: Sufficiently Active   Days of Exercise per Week: 7 days   Minutes of Exercise per Session: 60 min  Stress: No Stress Concern Present   Feeling of Stress : Not at all  Social Connections: Moderately Isolated   Frequency of Communication with Friends and Family: More than three times a week   Frequency of Social Gatherings with Friends and Family: More than three times a week   Attends Religious Services: Never   Marine scientist or Organizations: No   Attends Archivist Meetings: Never   Marital Status: Married  Human resources officer Violence: Not on file    Outpatient Medications Prior to Visit  Medication Sig Dispense Refill   Accu-Chek Softclix Lancets lancets Use to measure blood sugar twice a day 100 each 6   albuterol (ACCUNEB) 0.63 MG/3ML nebulizer solution Take 3 mLs (0.63 mg total) by nebulization every 6 (six) hours as needed for wheezing. 75 mL 12   albuterol (VENTOLIN HFA) 108 (90 Base) MCG/ACT inhaler Inhale 2 puffs into the lungs every 6 (six) hours as needed for wheezing or shortness of breath. 18 g 6   aspirin EC 81 MG tablet Take 1 tablet (81 mg total) by mouth daily. Swallow whole. 150 tablet 2   bisoprolol (ZEBETA) 5 MG tablet Take 1 tablet (5 mg total) by mouth daily. 30 tablet 4   Blood Glucose Monitoring Suppl (ACCU-CHEK GUIDE ME) w/Device KIT 1 kit by Does not apply route in the morning and at bedtime. Use to measure blood sugar twice a day 1 kit 0   co-enzyme Q-10 30 MG capsule Take 1 capsule (30 mg total) by mouth daily. 90 capsule 3   empagliflozin (JARDIANCE) 10  MG TABS tablet Take 1 tablet (10 mg total) by mouth daily before breakfast. 90 tablet 3   eplerenone (INSPRA) 25 MG tablet Take 0.5 tablets (12.5 mg total) by mouth daily. 30 tablet 4   ezetimibe (ZETIA) 10 MG tablet Take 1 tablet (10 mg total) by mouth daily. 90 tablet 3   fluticasone (FLONASE) 50 MCG/ACT nasal spray USE 1 SPRAY(S) IN EACH NOSTRIL ONCE DAILY AS NEEDED FOR ALLERGIES OR RHINITIS 16 g 2   furosemide (LASIX) 40 MG tablet Take 1 tablet 40 mg prn for weight gain of 3 lbs in 24 hours or 5 lbs in a week 30 tablet 5   glucose blood (ACCU-CHEK GUIDE) test strip Use to measure blood sugar twice a day 100  each 6   Hyprom-Naphaz-Polysorb-Zn Sulf (CLEAR EYES COMPLETE) SOLN Apply 1 drop to eye 3 (three) times daily. 15 mL 1   loratadine (CLARITIN) 10 MG tablet Take 1 tablet (10 mg total) by mouth daily. 30 tablet 11   mometasone-formoterol (DULERA) 100-5 MCG/ACT AERO Inhale 2 puffs into the lungs 2 (two) times daily. 13 g 6   montelukast (SINGULAIR) 10 MG tablet TAKE 1 TABLET BY MOUTH AT BEDTIME 30 tablet 0   nitroGLYCERIN (NITROSTAT) 0.4 MG SL tablet Place 1 tablet (0.4 mg total) under the tongue every 5 (five) minutes x 3 doses as needed for chest pain. 25 tablet 3   omeprazole (PRILOSEC) 20 MG capsule Take 1 capsule by mouth once daily 30 capsule 11   sacubitril-valsartan (ENTRESTO) 97-103 MG Take 1 tablet by mouth 2 (two) times daily. 60 tablet 4   sodium zirconium cyclosilicate (LOKELMA) 5 g packet Take 10 g by mouth daily. 30 each 11   ticagrelor (BRILINTA) 90 MG TABS tablet Take 1 tablet (90 mg total) by mouth 2 (two) times daily. 180 tablet 3   tiotropium (SPIRIVA HANDIHALER) 18 MCG inhalation capsule Place 1 capsule (18 mcg total) into inhaler and inhale daily. 30 capsule 6   traZODone (DESYREL) 50 MG tablet TAKE 1/2 TO 1 TABLET BY MOUTH AT BEDTIME 90 tablet 1   pravastatin (PRAVACHOL) 80 MG tablet Take 1 tablet (80 mg total) by mouth every evening. (Patient not taking: Reported on  07/19/2021) 30 tablet 11   No facility-administered medications prior to visit.    Allergies  Allergen Reactions   Atorvastatin     Muscle weakness and fatigue on 44m daily   Spironolactone     gynecomastia   Metformin And Related Anxiety and Palpitations   Other Anxiety and Palpitations    ROS Review of Systems  Constitutional:  Negative for chills and fever.  HENT:  Positive for congestion and postnasal drip. Negative for ear pain, sinus pressure, sinus pain, sore throat and trouble swallowing.   Eyes: Negative.   Respiratory:  Positive for cough, shortness of breath and wheezing.   Cardiovascular:  Negative for chest pain and palpitations.  Gastrointestinal:  Negative for nausea and vomiting.  Endocrine: Negative.   Genitourinary: Negative.   Musculoskeletal: Negative.   Skin: Negative.   Allergic/Immunologic: Negative.   Neurological: Negative.   Hematological: Negative.   Psychiatric/Behavioral: Negative.       Objective:    Physical Exam Vitals and nursing note reviewed.  Constitutional:      General: He is not in acute distress.    Appearance: Normal appearance. He is not ill-appearing.  HENT:     Head: Normocephalic and atraumatic.     Right Ear: Tympanic membrane, ear canal and external ear normal.     Left Ear: Tympanic membrane, ear canal and external ear normal.     Nose: Congestion present.     Mouth/Throat:     Mouth: Mucous membranes are moist.     Pharynx: Oropharynx is clear.  Eyes:     Extraocular Movements: Extraocular movements intact.     Conjunctiva/sclera: Conjunctivae normal.     Pupils: Pupils are equal, round, and reactive to light.  Cardiovascular:     Rate and Rhythm: Normal rate and regular rhythm.     Pulses: Normal pulses.     Heart sounds: Normal heart sounds.  Pulmonary:     Effort: Prolonged expiration present.     Breath sounds: Examination of the right-upper field reveals wheezing.  Examination of the left-upper field  reveals wheezing. Examination of the right-middle field reveals wheezing. Examination of the left-middle field reveals wheezing. Examination of the right-lower field reveals wheezing. Examination of the left-lower field reveals wheezing. Wheezing present. No decreased breath sounds.  Musculoskeletal:        General: Normal range of motion.     Cervical back: Normal range of motion and neck supple.  Skin:    General: Skin is warm and dry.  Neurological:     General: No focal deficit present.     Mental Status: He is alert and oriented to person, place, and time.  Psychiatric:        Mood and Affect: Mood normal.        Behavior: Behavior normal.        Thought Content: Thought content normal.        Judgment: Judgment normal.    BP 109/74 (BP Location: Right Arm, Patient Position: Sitting, Cuff Size: Normal)   Pulse 94   Temp 98.6 F (37 C) (Oral)   Resp 20   Ht 5' 8"  (1.727 m)   Wt 199 lb (90.3 kg)   SpO2 95%   BMI 30.26 kg/m  Wt Readings from Last 3 Encounters:  07/19/21 199 lb (90.3 kg)  05/25/21 210 lb 3.2 oz (95.3 kg)  04/27/21 213 lb 6.4 oz (96.8 kg)     Health Maintenance Due  Topic Date Due   FOOT EXAM  Never done   COLON CANCER SCREENING ANNUAL FOBT  Never done   COLONOSCOPY (Pts 45-38yr Insurance coverage will need to be confirmed)  Never done   Zoster Vaccines- Shingrix (1 of 2) Never done   COVID-19 Vaccine (3 - [facility]oster for [facility]) 01/20/2020    There are no preventive care reminders to display for this patient.  Lab Results  Component Value Date   TSH 0.784 02/02/2021   Lab Results  Component Value Date   WBC 7.2 04/23/2021   HGB 13.1 04/23/2021   HCT 39.9 04/23/2021   MCV 90 04/23/2021   PLT 258 04/23/2021   Lab Results  Component Value Date   NA 141 05/25/2021   K 3.8 05/25/2021   CO2 28 05/25/2021   GLUCOSE 122 (H) 05/25/2021   BUN 15 05/25/2021   CREATININE 1.19 05/25/2021   BILITOT 0.6 05/25/2021   ALKPHOS 76 05/25/2021   AST  33 05/25/2021   ALT 29 05/25/2021   PROT 6.7 05/25/2021   ALBUMIN 4.0 05/25/2021   CALCIUM 9.5 05/25/2021   ANIONGAP 6 05/25/2021   EGFR 66 03/04/2021   Lab Results  Component Value Date   CHOL 221 (H) 02/02/2021   Lab Results  Component Value Date   HDL 53 02/02/2021   Lab Results  Component Value Date   LDLCALC 150 (H) 02/02/2021   Lab Results  Component Value Date   TRIG 89 02/02/2021   Lab Results  Component Value Date   CHOLHDL 4.2 02/02/2021   Lab Results  Component Value Date   HGBA1C 6.4 (H) 02/02/2021      Assessment & Plan:   Problem List Items Addressed This Visit       Cardiovascular and Mediastinum   Essential hypertension     Respiratory   COPD with acute exacerbation ([place] [facility]   Relevant Medications   predniSONE (DELTASONE) 20 MG tablet   azithromycin (ZITHROMAX) 250 MG tablet     Endocrine   Diabetes mellitus type II, controlled ([place]  Meds ordered this encounter  Medications   predniSONE (DELTASONE) 20 MG tablet    Sig: Take 2 tablets (40 mg total) by mouth daily with breakfast for 10 days.    Dispense:  20 tablet    Refill:  0    Order Specific Question:   Supervising Provider    Answer:   Asencion Noble E [[id number]]   azithromycin (ZITHROMAX) 250 MG tablet    Sig: Take 2 tabs PO day 1, then take 1 tab PO once daily    Dispense:  6 tablet    Refill:  0    Order Specific Question:   Supervising Provider    Answer:   WRIGHT, PATRICK E [[id number]]  1. COPD with acute exacerbation (HCC) Trial prednisone, azithromycin.  Patient education given on supportive care.  Red flags for prompt reevaluation. - predniSONE (DELTASONE) 20 MG tablet; Take 2 tablets (40 mg total) by mouth daily with breakfast for 10 days.  Dispense: 20 tablet; Refill: 0 - azithromycin (ZITHROMAX) 250 MG tablet; Take 2 tabs PO day 1, then take 1 tab PO once daily  Dispense: 6 tablet; Refill: 0  2. Controlled type 2 diabetes mellitus with other circulatory  complication, without long-term current use of insulin ([place])   3. Essential hypertension    I have reviewed the patient's medical history (PMH, PSH, Social History, Family History, Medications, and allergies) , and have been updated if relevant. I spent 30 minutes reviewing chart and  face to face time with patient.     Follow-up: Return if symptoms worsen or fail to improve.    Loraine Grip [name], [name]

## 2021-07-19 NOTE — Progress Notes (Signed)
Patient has eaten and taken medication today Patient reports intermittent cough with occasional green sputum. Patient reports prednisone, penicillin, tylenol 3 combination provided relief during last episode earlier in the year. Pain in the head is scaled at a 9 currently and described as pressure.

## 2021-07-19 NOTE — Telephone Encounter (Signed)
Pt called in c/o having shortness of breath and congestion in his chest.   "I'm coughing up stuff".   He has COPD and asthma.   Also had 4 heart attacks.   He is denying chest discomfort.    I emphasized to call 911 if he developed worsening shortness of breath or any chest discomfort due to his history prior to his appt this evening at 3:50 in office visit with Carrolyn Meiers, A-C at [facility].   He was agreeable to this plan.

## 2021-07-19 NOTE — Telephone Encounter (Signed)
Pt calling in.   Reason for Disposition  [1] MILD difficulty breathing (e.g., minimal/no SOB at rest, SOB with walking, pulse <100) AND [2] NEW-onset or WORSE than normal  Answer Assessment - Initial Assessment Questions 1. RESPIRATORY STATUS: "Describe your breathing?" (e.g., wheezing, shortness of breath, unable to speak, severe coughing)      Pt calling in.   Having shortness of breath 2. ONSET: "When did this breathing problem begin?"      Last night 3. PATTERN "Does the difficult breathing come and go, or has it been constant since it started?"      Constant I can't hardly breath.   If I go to the bathroom I'm out of breath. I have asthma and COPD.    I stopped smoking 3 yrs ago 4. SEVERITY: "How bad is your breathing?" (e.g., mild, moderate, severe)    - MILD: No SOB at rest, mild SOB with walking, speaks normally in sentences, can lie down, no retractions, pulse < 100.    - MODERATE: SOB at rest, SOB with minimal exertion and prefers to sit, cannot lie down flat, speaks in phrases, mild retractions, audible wheezing, pulse 100-120.    - SEVERE: Very SOB at rest, speaks in single words, struggling to breathe, sitting hunched forward, retractions, pulse > 120      Moderate 5. RECURRENT SYMPTOM: "Have you had difficulty breathing before?" If Yes, ask: "When was the last time?" and "What happened that time?"      Yes when I'm sick 6. CARDIAC HISTORY: "Do you have any history of heart disease?" (e.g., heart attack, angina, bypass surgery, angioplasty)      I had 4 heart attacks. 7. LUNG HISTORY: "Do you have any history of lung disease?"  (e.g., pulmonary embolus, asthma, emphysema)     Yes asthma and COPD 8. CAUSE: "What do you think is causing the breathing problem?"      I'm sick with a cold.     9. OTHER SYMPTOMS: "Do you have any other symptoms? (e.g., dizziness, runny nose, cough, chest pain, fever)     No runny nose or sore throat, no nausea or vomiting or diarrhea.  No dizziness  or fever. 10. O2 SATURATION MONITOR:  "Do you use an oxygen saturation monitor (pulse oximeter) at home?" If Yes, "What is your reading (oxygen level) today?" "What is your usual oxygen saturation reading?" (e.g., 95%)       No 11. PREGNANCY: "Is there any chance you are pregnant?" "When was your last menstrual period?"       N/A 12. TRAVEL: "Have you traveled out of the country in the last month?" (e.g., travel history, exposures)       No sick exposures  Protocols used: Breathing Difficulty-A-AH

## 2021-07-21 ENCOUNTER — Ambulatory Visit (INDEPENDENT_AMBULATORY_CARE_PROVIDER_SITE_OTHER): Payer: Medicare Other

## 2021-07-21 DIAGNOSIS — I255 Ischemic cardiomyopathy: Secondary | ICD-10-CM

## 2021-07-21 LAB — CUP PACEART REMOTE DEVICE CHECK
Battery Remaining Longevity: 77 mo
Battery Remaining Percentage: 78 %
Battery Voltage: 2.99 V
Brady Statistic AP VP Percent: 1 %
Brady Statistic AP VS Percent: 2.4 %
Brady Statistic AS VP Percent: 1 %
Brady Statistic AS VS Percent: 98 %
Brady Statistic RA Percent Paced: 2.1 %
Brady Statistic RV Percent Paced: 1 %
Date Time Interrogation Session: 20221130040016
HighPow Impedance: 81 Ohm
HighPow Impedance: 81 Ohm
Implantable Lead Implant Date: 20200824
Implantable Lead Implant Date: 20200824
Implantable Lead Location: 753859
Implantable Lead Location: 753860
Implantable Pulse Generator Implant Date: 20200824
Lead Channel Impedance Value: 360 Ohm
Lead Channel Impedance Value: 390 Ohm
Lead Channel Pacing Threshold Amplitude: 0.75 V
Lead Channel Pacing Threshold Amplitude: 0.75 V
Lead Channel Pacing Threshold Pulse Width: 0.5 ms
Lead Channel Pacing Threshold Pulse Width: 0.5 ms
Lead Channel Sensing Intrinsic Amplitude: 11.9 mV
Lead Channel Sensing Intrinsic Amplitude: 2.1 mV
Lead Channel Setting Pacing Amplitude: 2 V
Lead Channel Setting Pacing Amplitude: 2.5 V
Lead Channel Setting Pacing Pulse Width: 0.5 ms
Lead Channel Setting Sensing Sensitivity: 0.5 mV
Pulse Gen Serial Number: 1300954

## 2021-07-23 ENCOUNTER — Ambulatory Visit: Payer: Self-pay

## 2021-07-23 ENCOUNTER — Telehealth: Payer: Self-pay | Admitting: Pharmacist

## 2021-07-23 NOTE — Patient Outreach (Signed)
07/23/2021 Name: Stephen Jennings MRN: [id number] DOB: 03-02-1956  Referred by: Ladell Pier, MD Reason for referral : No chief complaint on file.   I spoke with Stephen Jennings on the phone this morning and he states at this time he is no longer interested in the Managed Medicaid services and does not wish to be contacted for this service moving forward.   Follow Up Plan: Discussed with patient if he wishes to resume Managed Medicaid services he may do so at any time and let his primary care provider know.  Hughes Better PharmD, CPP High Risk Managed Medicaid [place] [phone]

## 2021-07-27 ENCOUNTER — Ambulatory Visit: Payer: Medicare Other

## 2021-07-27 ENCOUNTER — Other Ambulatory Visit: Payer: Self-pay

## 2021-07-27 ENCOUNTER — Encounter (HOSPITAL_COMMUNITY): Payer: Self-pay | Admitting: Internal Medicine

## 2021-07-27 ENCOUNTER — Ambulatory Visit (HOSPITAL_COMMUNITY)
Admission: RE | Admit: 2021-07-27 | Discharge: 2021-07-27 | Disposition: A | Discharge status: Home / Self Care | Payer: Medicare Other | Source: Ambulatory Visit | Attending: Internal Medicine | Admitting: Internal Medicine

## 2021-07-27 ENCOUNTER — Ambulatory Visit (HOSPITAL_BASED_OUTPATIENT_CLINIC_OR_DEPARTMENT_OTHER)
Admission: RE | Admit: 2021-07-27 | Discharge: 2021-07-27 | Disposition: A | Discharge status: Home / Self Care | Payer: Medicare Other | Source: Ambulatory Visit | Attending: Internal Medicine | Admitting: Internal Medicine

## 2021-07-27 VITALS — BP 120/74 | HR 62 | Wt 215.0 lb

## 2021-07-27 DIAGNOSIS — Z7984 Long term (current) use of oral hypoglycemic drugs: Secondary | ICD-10-CM | POA: Diagnosis not present

## 2021-07-27 DIAGNOSIS — Z91119 Patient's noncompliance with dietary regimen due to unspecified reason: Secondary | ICD-10-CM | POA: Diagnosis not present

## 2021-07-27 DIAGNOSIS — I5022 Chronic systolic (congestive) heart failure: Secondary | ICD-10-CM

## 2021-07-27 DIAGNOSIS — E875 Hyperkalemia: Secondary | ICD-10-CM | POA: Diagnosis not present

## 2021-07-27 DIAGNOSIS — I2510 Atherosclerotic heart disease of native coronary artery without angina pectoris: Secondary | ICD-10-CM | POA: Insufficient documentation

## 2021-07-27 DIAGNOSIS — J449 Chronic obstructive pulmonary disease, unspecified: Secondary | ICD-10-CM | POA: Diagnosis not present

## 2021-07-27 DIAGNOSIS — I340 Nonrheumatic mitral (valve) insufficiency: Secondary | ICD-10-CM | POA: Diagnosis not present

## 2021-07-27 DIAGNOSIS — I110 Hypertensive heart disease with heart failure: Secondary | ICD-10-CM | POA: Insufficient documentation

## 2021-07-27 DIAGNOSIS — Z95810 Presence of automatic (implantable) cardiac defibrillator: Secondary | ICD-10-CM | POA: Insufficient documentation

## 2021-07-27 DIAGNOSIS — E785 Hyperlipidemia, unspecified: Secondary | ICD-10-CM | POA: Diagnosis not present

## 2021-07-27 DIAGNOSIS — I252 Old myocardial infarction: Secondary | ICD-10-CM | POA: Insufficient documentation

## 2021-07-27 DIAGNOSIS — E119 Type 2 diabetes mellitus without complications: Secondary | ICD-10-CM | POA: Insufficient documentation

## 2021-07-27 DIAGNOSIS — Z955 Presence of coronary angioplasty implant and graft: Secondary | ICD-10-CM | POA: Insufficient documentation

## 2021-07-27 DIAGNOSIS — Z87891 Personal history of nicotine dependence: Secondary | ICD-10-CM | POA: Diagnosis not present

## 2021-07-27 DIAGNOSIS — Z8674 Personal history of sudden cardiac arrest: Secondary | ICD-10-CM | POA: Diagnosis not present

## 2021-07-27 DIAGNOSIS — I10 Essential (primary) hypertension: Secondary | ICD-10-CM

## 2021-07-27 DIAGNOSIS — I255 Ischemic cardiomyopathy: Secondary | ICD-10-CM | POA: Diagnosis not present

## 2021-07-27 DIAGNOSIS — Z7951 Long term (current) use of inhaled steroids: Secondary | ICD-10-CM | POA: Diagnosis not present

## 2021-07-27 DIAGNOSIS — Z79899 Other long term (current) drug therapy: Secondary | ICD-10-CM | POA: Insufficient documentation

## 2021-07-27 LAB — ECHOCARDIOGRAM COMPLETE
Area-P 1/2: 4.06 cm2
Calc EF: 41.9 %
S' Lateral: 4.9 cm
Single Plane A2C EF: 45.1 %
Single Plane A4C EF: 40.6 %

## 2021-07-27 LAB — COMPREHENSIVE METABOLIC PANEL
ALT: 75 U/L — ABNORMAL HIGH (ref 0–44)
AST: 35 U/L (ref 15–41)
Albumin: 3.6 g/dL (ref 3.5–5.0)
Alkaline Phosphatase: 67 U/L (ref 38–126)
Anion gap: 8 (ref 5–15)
BUN: 15 mg/dL (ref 8–23)
CO2: 25 mmol/L (ref 22–32)
Calcium: 9 mg/dL (ref 8.9–10.3)
Chloride: 105 mmol/L (ref 98–111)
Creatinine, Ser: 0.91 mg/dL (ref 0.61–1.24)
GFR, Estimated: >60 mL/min (ref >60)
Glucose, Bld: 96 mg/dL (ref 70–99)
Potassium: 3.6 mmol/L (ref 3.5–5.1)
Sodium: 138 mmol/L (ref 135–145)
Total Bilirubin: 0.5 mg/dL (ref 0.3–1.2)
Total Protein: 6 g/dL — ABNORMAL LOW (ref 6.5–8.1)

## 2021-07-27 LAB — LIPID PANEL
Cholesterol: 98 mg/dL (ref 0–200)
HDL: 56 mg/dL (ref >40)
LDL Cholesterol: 36 mg/dL (ref 0–99)
Total CHOL/HDL Ratio: 1.8 RATIO
Triglycerides: 30 mg/dL (ref <150)
VLDL: 6 mg/dL (ref 0–40)

## 2021-07-27 LAB — BRAIN NATRIURETIC PEPTIDE: B Natriuretic Peptide: 415.9 pg/mL — ABNORMAL HIGH (ref 0.0–100.0)

## 2021-07-27 MED ORDER — FUROSEMIDE 40 MG PO TABS: 40.0000 mg | ORAL_TABLET | Freq: Every day | ORAL | 6 refills | Status: DC

## 2021-07-27 NOTE — Progress Notes (Signed)
Advanced Heart Failure Clinic Note   PCP: Ladell Pier, MD PCP-Cardiologist: Shelva Majestic, MD  HF Cardiologist: Dr. Haroldine Laws  HPI: [name] is a 65 y.o. 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   Brought to ER 03/31/19 with respiratory failure/cardiac arrest. Intubated in ER. Initial ECG with anterior ST elevation. Cath by Dr. Claiborne Billings with 3v CAD and totally occluded prox LAD. LCX anomalous off RCA. Underwent PCI of LAD which was diffusely diseased vessel. Developed cardiogenic shock requiring pressors and mechanical support with impella.  03/31/2019 Echo 20-25%. Hospital course complicated by Torsades. EP consulted placed ICD prior to discharge.    Echo 2/21  EF 45-50%   Echo 4/22 EF 55-60%    He stopped Brillinta in 4/22. Admitted in 6/22 with NSTEMI. Cath with patent proximal LAD stent with mild 20% in-stent narrowing.  There is significant thrombus burden immediately after the stent extending into the diagonal vessel and mid LAD with TIMI-3 flow. Treated with Aggrastat x 48 hours. ECHO 02/02/21 EF 35-40% Relook angiography of the left coronary system showed improvement in the previous thrombus burden in the diagonal and LAD distal stent and beyond but moderate residual thrombus was still present. Underwent successful percutaneous coronary intervention of the LAD utilizing Pronto thrombectomy, intravascular ultrasound and ultimate PTCA of the distal stented segment and mid LAD with residual narrowing 0%. Recommended indefinite DAPT.   Today he returns for HF follow up.Doing OK. Still working FT as Administrator, Civil Service. About every 2-3 months says he gets SOB and feels it "coming on". Takes inhaler and lasix and it gets better. No CP, orthopnea or PND. Takes lasix 40 mg MWF.   Echo today 07/27/21 EF 35-40% Personally reviewed   Cath: 02/02/21 Prox RCA lesion is 20% stenosed. Prox LAD to Mid LAD lesion is 15% stenosed. 1st Diag lesion is 5% stenosed. Ost LAD to Prox  LAD lesion is 20% stenosed. Dist LAD-1 lesion is 40% stenosed. Dist LAD-2 lesion is 80% stenosed. There is severe left ventricular systolic dysfunction. LV end diastolic pressure is severely elevated. The left ventricular ejection fraction is 25-35% by visual estimate.  Past Medical History:  Diagnosis Date   Cardiogenic shock (HCC)    CHF (congestive heart failure) (HCC)    Coronary artery disease    Diabetes mellitus without complication ([place])    Hyperlipidemia    Medical history non-contributory    Current Outpatient Medications  Medication Sig Dispense Refill   Accu-Chek Softclix Lancets lancets Use to measure blood sugar twice a day 100 each 6   albuterol (ACCUNEB) 0.63 MG/3ML nebulizer solution Take 3 mLs (0.63 mg total) by nebulization every 6 (six) hours as needed for wheezing. 75 mL 12   albuterol (VENTOLIN HFA) 108 (90 Base) MCG/ACT inhaler Inhale 2 puffs into the lungs every 6 (six) hours as needed for wheezing or shortness of breath. 18 g 6   aspirin EC 81 MG tablet Take 1 tablet (81 mg total) by mouth daily. Swallow whole. 150 tablet 2   azithromycin (ZITHROMAX) 250 MG tablet Take 2 tabs PO day 1, then take 1 tab PO once daily 6 tablet 0   bisoprolol (ZEBETA) 5 MG tablet Take 1 tablet (5 mg total) by mouth daily. 30 tablet 4   Blood Glucose Monitoring Suppl (ACCU-CHEK GUIDE ME) w/Device KIT 1 kit by Does not apply route in the morning and at bedtime. Use to measure blood sugar twice a day 1 kit 0   co-enzyme  Q-10 30 MG capsule Take 1 capsule (30 mg total) by mouth daily. 90 capsule 3   empagliflozin (JARDIANCE) 10 MG TABS tablet Take 1 tablet (10 mg total) by mouth daily before breakfast. 90 tablet 3   eplerenone (INSPRA) 25 MG tablet Take 0.5 tablets (12.5 mg total) by mouth daily. 30 tablet 4   ezetimibe (ZETIA) 10 MG tablet Take 1 tablet (10 mg total) by mouth daily. 90 tablet 3   fluticasone (FLONASE) 50 MCG/ACT nasal spray USE 1 SPRAY(S) IN EACH NOSTRIL ONCE DAILY AS  NEEDED FOR ALLERGIES OR RHINITIS 16 g 2   furosemide (LASIX) 40 MG tablet Take 1 tablet 40 mg prn for weight gain of 3 lbs in 24 hours or 5 lbs in a week 30 tablet 5   glucose blood (ACCU-CHEK GUIDE) test strip Use to measure blood sugar twice a day 100 each 6   Hyprom-Naphaz-Polysorb-Zn Sulf (CLEAR EYES COMPLETE) SOLN Apply 1 drop to eye 3 (three) times daily. 15 mL 1   loratadine (CLARITIN) 10 MG tablet Take 1 tablet (10 mg total) by mouth daily. 30 tablet 11   mometasone-formoterol (DULERA) 100-5 MCG/ACT AERO Inhale 2 puffs into the lungs 2 (two) times daily. (Patient taking differently: Inhale 2 puffs into the lungs 2 (two) times daily. As needed) 13 g 6   montelukast (SINGULAIR) 10 MG tablet TAKE 1 TABLET BY MOUTH AT BEDTIME 30 tablet 0   nitroGLYCERIN (NITROSTAT) 0.4 MG SL tablet Place 1 tablet (0.4 mg total) under the tongue every 5 (five) minutes x 3 doses as needed for chest pain. 25 tablet 3   omeprazole (PRILOSEC) 20 MG capsule Take 1 capsule by mouth once daily 30 capsule 11   pravastatin (PRAVACHOL) 80 MG tablet Take 1 tablet (80 mg total) by mouth every evening. 30 tablet 11   predniSONE (DELTASONE) 20 MG tablet Take 2 tablets (40 mg total) by mouth daily with breakfast for 10 days. 20 tablet 0   sacubitril-valsartan (ENTRESTO) 97-103 MG Take 1 tablet by mouth 2 (two) times daily. 60 tablet 4   sodium zirconium cyclosilicate (LOKELMA) 5 g packet Take 10 g by mouth daily. 30 each 11   ticagrelor (BRILINTA) 90 MG TABS tablet Take 1 tablet (90 mg total) by mouth 2 (two) times daily. 180 tablet 3   tiotropium (SPIRIVA HANDIHALER) 18 MCG inhalation capsule Place 1 capsule (18 mcg total) into inhaler and inhale daily. 30 capsule 6   traZODone (DESYREL) 50 MG tablet TAKE 1/2 TO 1 TABLET BY MOUTH AT BEDTIME 90 tablet 1   No current facility-administered medications for this encounter.   Allergies  Allergen Reactions   Atorvastatin     Muscle weakness and fatigue on 54m daily    Spironolactone     gynecomastia   Metformin And Related Anxiety and Palpitations   Other Anxiety and Palpitations   Social History   Socioeconomic History   Marital status: Married    Spouse name: Not on file   Number of children: 1   Years of education: 8   Highest education level: 8th grade  Occupational History   Not on file  Tobacco Use   Smoking status: Former    Types: Cigarettes    Quit date: 04/08/2019    Years since quitting: 2.3    Passive exposure: Current (wife smokes)   Smokeless tobacco: Never  Vaping Use   Vaping Use: Never used  Substance and Sexual Activity   Alcohol use: No   Drug use: No  Sexual activity: Not Currently  Other Topics Concern   Not on file  Social History Narrative   Lives with wife   Right handed   Caffeine: about 2 cups of coffee every morning, no soda   Social Determinants of Health   Financial Resource Strain: Low Risk    Difficulty of Paying Living Expenses: Not very hard  Food Insecurity: No Food Insecurity   Worried About Charity fundraiser in the Last Year: Never true   Ran Out of Food in the Last Year: Never true  Transportation Needs: No Transportation Needs   Lack of Transportation (Medical): No   Lack of Transportation (Non-Medical): No  Physical Activity: Sufficiently Active   Days of Exercise per Week: 7 days   Minutes of Exercise per Session: 60 min  Stress: No Stress Concern Present   Feeling of Stress : Not at all  Social Connections: Moderately Isolated   Frequency of Communication with Friends and Family: More than three times a week   Frequency of Social Gatherings with Friends and Family: More than three times a week   Attends Religious Services: Never   Marine scientist or Organizations: No   Attends Music therapist: Never   Marital Status: Married  Human resources officer Violence: Not on file   Family History  Problem Relation Age of Onset   Hypertension Mother    Heart failure Mother     Headache Neg Hx    Migraines Neg Hx    BP 120/74   Pulse 62   Wt 97.5 kg (215 lb)   SpO2 96%   BMI 32.69 kg/m   Wt Readings from Last 3 Encounters:  07/27/21 97.5 kg (215 lb)  07/19/21 90.3 kg (199 lb)  05/25/21 95.3 kg (210 lb 3.2 oz)   PHYSICAL EXAM: General:  Well appearing. No resp difficulty HEENT: normal Neck: supple. no JVD. Carotids 2+ bilat; no bruits. No lymphadenopathy or thryomegaly appreciated. Cor: PMI nondisplaced. Regular rate & rhythm. No rubs, gallops or murmurs. Lungs: decreased throughout Abdomen: soft, nontender, nondistended. No hepatosplenomegaly. No bruits or masses. Good bowel sounds. Extremities: no cyanosis, clubbing, rash, trace edema Neuro: alert & orientedx3, cranial nerves grossly intact. moves all 4 extremities w/o difficulty. Affect pleasant   Device Interrogation: No VT/VF. Fluid trending up Personally reviewed   ASSESSMENT & PLAN:  1. Chronic systolic HF - Echo 2/97 LG92-11% - Echo 09/23/19: EF 45-50% - Echo 4/22 EF 55-60%  - NSTEMI in 6/22 -> Echo 6/22 EF 35-40% - Echo today 07/27/21 EF 35-40% Personally reviewed - Stable NYHA II. Volume has been up and down in setting of poor dietary compliance. He is up today - Continue Entresto to 97/103 mg bid. - Change lasix to 40 mg daily (previously on MWF). - Continue bisoprolol 5 mg daily. - Continue Jardiance 10 mg daily. - Off spiro due to hyperkalemia - Labs today. If K stays down may be able to rechallenge with spiro   2. Cardiac arrest/VT/torsades 03/1999 - Now s/p ICD. - ICD interrogated personally in clinic. No VT/AF.   3. CAD  - s/p acute anterior MI 8/20 with PCI/DES to LAD. Diffusely diseased vessel - + residual CAD in RCA and anomalous LCX - NSTEMI in 6/22 after stopping Brillinta. Stent patent but clot after stent -> s/p thrombectomy and POBA - Continue DAPT and statin indefinitely. - No s/s angina - Continue Jardiance 10 mg daily. - Check lipids today   4. DM2 - On  metformin &  Jardiance.  - Per PCP   5. COPD - stable. Continue inhalers - no longer smoking  6. HTN - Blood pressure well controlled. Continue current regimen.   [name], MD 07/27/21

## 2021-07-27 NOTE — Patient Instructions (Signed)
Medication Changes:  Increase lasix to 40 mg Daily.  Lab Work:  Labs done today, your results will be available in MyChart, we will contact you for abnormal readings.   Testing/Procedures:  none  Referrals:  none  Special Instructions // Education:  none  Follow-Up in: 4 months (April 2023) **call office in March 2023 for appointment**  At the [facility], you and your health needs are our priority. We have a designated team specialized in the treatment of Heart Failure. This Care Team includes your primary Heart Failure Specialized Cardiologist (physician), Advanced Practice Providers (APPs- Physician Assistants and Nurse Practitioners), and Pharmacist who all work together to provide you with the care you need, when you need it.   You may see any of the following providers on your designated Care Team at your next follow up:  Dr [name] Dr Haynes Kerns, NP Lyda Jester, [place] [facility] [place], [place] Audry Riles, PharmD   Please be sure to bring in all your medications bottles to every appointment.   Need to Contact us:  If you have any questions or concerns before your next appointment please send [place] a message through [facility] or call our office at [phone].    TO LEAVE A MESSAGE FOR THE NURSE SELECT OPTION 2, PLEASE LEAVE A MESSAGE INCLUDING: YOUR NAME DATE OF BIRTH CALL BACK NUMBER REASON FOR CALL**this is important as we prioritize the call backs  YOU WILL RECEIVE A CALL BACK THE SAME DAY AS LONG AS YOU CALL BEFORE 4:00 PM

## 2021-07-28 ENCOUNTER — Telehealth: Payer: Self-pay

## 2021-07-28 ENCOUNTER — Other Ambulatory Visit: Payer: Self-pay | Admitting: Internal Medicine

## 2021-07-28 MED ORDER — ALBUTEROL SULFATE HFA 108 (90 BASE) MCG/ACT IN AERS: 2.0000 | INHALATION_SPRAY | Freq: Four times a day (QID) | RESPIRATORY_TRACT | 12 refills | Status: DC | PRN

## 2021-07-28 NOTE — Patient Outreach (Signed)
Care Coordination  07/28/2021  Javante Nilsson 28-Jun-1956 [id number]  07/28/2021 Name: Ellsworth Waldschmidt MRN: [id number] DOB: Jan 12, 1956  Referred by: Ladell Pier, MD Reason for referral : High Risk Managed Medicaid (High Risk Managed Medicaid Case Closure )   A Telephone Outreach was made by the Managed Medicaid Pharmacist, Hughes Better, on 07/23/2021.  Patient stated he does not wish to continue with Managed Medicaid services at this time.  His case will be closed at this time.  Follow Up Plan: The Managed Medicaid care management team is available to follow up with the patient after provider conversation with the patient regarding recommendation for care management engagement and subsequent re-referral to the care management team.     Salvatore Marvel RN, BSN [facility] Coordinator [facility] Mobile: [phone]

## 2021-07-28 NOTE — Patient Instructions (Signed)
Kathleen Argue ,   [facility] Managed Care Team is available to provide assistance to you with your healthcare needs at no cost and as a benefit of your [facility] Health plan.  We understand that you are voluntarily declining services and have requested to un-enroll at this time.  The care management team is available to assist with your healthcare needs in the future.  Please do not hesitate to contact me at the number below. .   Thank you,   Salvatore Marvel RN, BSN [facility] Coordinator [place] [facility] Mobile: [phone]

## 2021-07-29 NOTE — Progress Notes (Signed)
Remote ICD transmission.   

## 2021-08-04 ENCOUNTER — Ambulatory Visit (INDEPENDENT_AMBULATORY_CARE_PROVIDER_SITE_OTHER): Payer: Medicare Other | Admitting: Cardiovascular Disease

## 2021-08-04 ENCOUNTER — Other Ambulatory Visit: Payer: Self-pay

## 2021-08-04 ENCOUNTER — Encounter: Payer: Self-pay | Admitting: Cardiovascular Disease

## 2021-08-04 DIAGNOSIS — E785 Hyperlipidemia, unspecified: Secondary | ICD-10-CM

## 2021-08-04 DIAGNOSIS — I255 Ischemic cardiomyopathy: Secondary | ICD-10-CM | POA: Diagnosis not present

## 2021-08-04 DIAGNOSIS — Z95810 Presence of automatic (implantable) cardiac defibrillator: Secondary | ICD-10-CM | POA: Diagnosis not present

## 2021-08-04 DIAGNOSIS — I5022 Chronic systolic (congestive) heart failure: Secondary | ICD-10-CM

## 2021-08-04 DIAGNOSIS — E119 Type 2 diabetes mellitus without complications: Secondary | ICD-10-CM | POA: Diagnosis not present

## 2021-08-04 DIAGNOSIS — I2510 Atherosclerotic heart disease of native coronary artery without angina pectoris: Secondary | ICD-10-CM

## 2021-08-04 DIAGNOSIS — I10 Essential (primary) hypertension: Secondary | ICD-10-CM

## 2021-08-04 DIAGNOSIS — I2102 ST elevation (STEMI) myocardial infarction involving left anterior descending coronary artery: Secondary | ICD-10-CM

## 2021-08-04 NOTE — Patient Instructions (Signed)
Medication Instructions:  The current medical regimen is effective;  continue present plan and medications.  *If you need a refill on your cardiac medications before your next appointment, please call your pharmacy*   Follow-Up: At [facility], you and your health needs are our priority.  As part of our continuing mission to provide you with exceptional heart care, we have created designated Provider Care Teams.  These Care Teams include your primary Cardiologist (physician) and Advanced Practice Providers (APPs -  Physician Assistants and Nurse Practitioners) who all work together to provide you with the care you need, when you need it.  We recommend signing up for the patient portal called "MyChart".  Sign up information is provided on this After Visit Summary.  MyChart is used to connect with patients for Virtual Visits (Telemedicine).  Patients are able to view lab/test results, encounter notes, upcoming appointments, etc.  Non-urgent messages can be sent to your provider as well.   To learn more about what you can do with MyChart, go to [email or web address].    Your next appointment:   7 month(s) July 2023  The format for your next appointment:   In Person  Provider:   Shelva Majestic, MD

## 2021-08-04 NOTE — Progress Notes (Signed)
Cardiology Office Note    Date:  08/11/2021   ID:  Stephen Jennings, DOB [birth year], MRN [id number]  PCP:  Ladell Pier, MD  Cardiologist:  Shelva Majestic, MD   10-monthfollow-up cardiology evaluation  History of Present Illness:  Stephen Mcburneyis a 65y.o. male who has a history of PAD and suffered an acute coronary syndrome in August 2020 with acute respiratory failure and cardiogenic shock in the setting of an anterior ST segment elevation myocardial infarction.  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  The RCA had 70% proximal stenosis.  The LAD was occluded proximally with TIMI 0 flow and he underwent successful intervention to the LAD PTCA and stenting of the proximal LAD, PTCA of the ostium of the diagonal vessel, and PTCA of the mid LAD with insertion of a 2.75 x 26 mm Resolute stent to the proximal LAD before the diagonal bifurcation.  Due to cardiogenic shock an Impella catheter was inserted for hemodynamic support.  Subsequently, he developed torsade and underwent ICD implantation.  An echo Doppler study in April 2022 showed normalization of LV function with EF at 55 to 60%.  Patient had done well and apparently stopped taking Brilinta in April 2022, 6 weeks prior to his most recent ACS which occurred on February 02, 2021.  Catheterization showed EF estimated 25 to 30% with significant thrombus burden in the LAD immediately after the stent extending into the diagonal vessel.  He was treated with Aggrastat and bolused with Brilinta and 2 days later return for repeat catheterization by me on February 04, 2021.  Relook angiography of the left coronary system showed improvement in the previous thrombus burden in the diagonal and LAD distal stent and beyond but moderate residual thrombus was still present for which he underwent successful percutaneous coronary intervention utilizing Pronto  thrombectomy, intravascular ultrasound, and ultimate PTCA of the distal stented segment and mid LAD.  Was recommended that he continue DAPT indefinitely.  Subsequently, the patient has been followed by Dr. [facility]oldine [facility]sat [facility].  He was last seen by Dr. BHaroldine Lawson March 23, 2021 at which time he admitted that over the past week he was experiencing more shortness of breath with activity or lying down.  Prior to that evaluation he had been seen in the Pharm.D. clinic and Lasix was stopped due to mild HKA and his beta-blocker increased and [facility].  When seen by Dr. BHaroldine Lawsrecently, it was recommended that Entresto be increased to 49/51 mg twice daily and that he take Lasix 40 mg for 2 days then 40 mg on Monday Wednesday and Friday.  He also has been on Jardiance 10 mg daily and had been on metoprolol succinate with plans to possibly switch to bisoprolol if wheezing develops.  His ICD was interrogated and there was no VT or AF noted.  I saw him for my initial cardiology office evaluation in March 24, 2021 following his June 2022 hospitalization.  He has not had any recurrent chest pain.  Breathing has improved with the recent resumption of Lasix.  He continues to be on aspirin and Brilinta 90 mg twice daily for DAPT.  At that time, he was only on rosuvastatin at 20 mg with Zetia and with his LDL cholesterol at 150 with total cholesterol 221 in June 2022 I suggested he increase rosuvastatin to 40 mg and continue  Zetia 10 mg with target LDL less than 55 if at all possible.  Since I saw him he has felt well.  He denies any recurrent chest pain.  He was reevaluated in his heart clinic failure clinic by Allena Katz, [place] on May 25, 2021 and saw Dr. Haroldine Laws on July 27, 2021.  He was felt to have stable NYHA II symptomatology.  Presently, he is on Entresto 97/103 mg twice a day, Lasix 40 mg daily, recently increased from Monday Wednesday and  Friday, bisoprolol 5 mg, and Jardiance 10 mg daily.  He is no longer on spironolactone due to hyperkalemia.  He has not had any VT or AF on recent ICD interrogation.  He denies any recurrent anginal symptomatology.  He underwent a follow-up echo Doppler study on July 27, 2021 which showed EF at 35 to 40% with moderate dilation of his left ventricle and grade 2 diastolic dysfunction.  There was mild biatrial enlargement, mild MR.  He presents for follow-up evaluation.   Past Medical History:  Diagnosis Date   Cardiogenic shock ([place])    CHF (congestive heart failure) ([place])    Coronary artery disease    Diabetes mellitus without complication ([place])    Hyperlipidemia    Medical history non-contributory     Past Surgical History:  Procedure Laterality Date   CARDIAC CATHETERIZATION     CORONARY BALLOON ANGIOPLASTY N/A 02/04/2021   Procedure: CORONARY BALLOON ANGIOPLASTY;  Surgeon: Troy Sine, MD;  Location: [place] [facility];  Service: Cardiovascular;  Laterality: N/A;   CORONARY THROMBECTOMY N/A 02/04/2021   Procedure: Coronary Thrombectomy;  Surgeon: Troy Sine, MD;  Location: [place] [facility];  Service: Cardiovascular;  Laterality: N/A;   CORONARY/GRAFT ACUTE MI REVASCULARIZATION N/A 03/31/2019   Procedure: Coronary/Graft Acute MI Revascularization;  Surgeon: Troy Sine, MD;  Location: [place] [facility];  Service: Cardiovascular;  Laterality: N/A;   ICD IMPLANT N/A 04/15/2019   Procedure: ICD IMPLANT;  Surgeon: Constance Haw, MD;  Location: [place] [facility];  Service: Cardiovascular;  Laterality: N/A;   INTRAVASCULAR ULTRASOUND/IVUS N/A 02/04/2021   Procedure: Intravascular Ultrasound/IVUS;  Surgeon: Troy Sine, MD;  Location: [place] [facility];  Service: Cardiovascular;  Laterality: N/A;   LEFT HEART CATH AND CORONARY ANGIOGRAPHY N/A 03/31/2019   Procedure: LEFT HEART CATH AND CORONARY ANGIOGRAPHY;  Surgeon: Troy Sine, MD;  Location: [place] [facility];   Service: Cardiovascular;  Laterality: N/A;   LEFT HEART CATH AND CORONARY ANGIOGRAPHY N/A 02/02/2021   Procedure: LEFT HEART CATH AND CORONARY ANGIOGRAPHY;  Surgeon: Troy Sine, MD;  Location: [place] [facility];  Service: Cardiovascular;  Laterality: N/A;   LEFT HEART CATH AND CORONARY ANGIOGRAPHY N/A 02/04/2021   Procedure: LEFT HEART CATH AND CORONARY ANGIOGRAPHY;  Surgeon: Troy Sine, MD;  Location: [place] [facility];  Service: Cardiovascular;  Laterality: N/A;   NO PAST SURGERIES     RIGHT HEART CATH N/A 03/31/2019   Procedure: RIGHT HEART CATH;  Surgeon: Troy Sine, MD;  Location: [place] [facility];  Service: Cardiovascular;  Laterality: N/A;   VENTRICULAR ASSIST DEVICE INSERTION N/A 03/31/2019   Procedure: VENTRICULAR ASSIST DEVICE INSERTION;  Surgeon: Troy Sine, MD;  Location: [place] [facility];  Service: Cardiovascular;  Laterality: N/A;    Current Medications: Outpatient Medications Prior to Visit  Medication Sig Dispense Refill   Accu-Chek Softclix Lancets lancets Use to measure blood sugar twice a day 100 each 6  albuterol (ACCUNEB) 0.63 MG/3ML nebulizer solution Take 3 mLs (0.63 mg total) by nebulization every 6 (six) hours as needed for wheezing. 75 mL 12   albuterol (VENTOLIN HFA) 108 (90 Base) MCG/ACT inhaler Inhale 2 puffs into the lungs every 6 (six) hours as needed for wheezing or shortness of breath. 8 g 12   aspirin EC 81 MG tablet Take 1 tablet (81 mg total) by mouth daily. Swallow whole. 150 tablet 2   bisoprolol (ZEBETA) 5 MG tablet Take 1 tablet (5 mg total) by mouth daily. 30 tablet 4   Blood Glucose Monitoring Suppl (ACCU-CHEK GUIDE ME) w/Device KIT 1 kit by Does not apply route in the morning and at bedtime. Use to measure blood sugar twice a day 1 kit 0   co-enzyme Q-10 30 MG capsule Take 1 capsule (30 mg total) by mouth daily. 90 capsule 3   empagliflozin (JARDIANCE) 10 MG TABS tablet Take 1 tablet (10 mg total) by mouth daily before breakfast.  90 tablet 3   eplerenone (INSPRA) 25 MG tablet Take 0.5 tablets (12.5 mg total) by mouth daily. 30 tablet 4   ezetimibe (ZETIA) 10 MG tablet Take 1 tablet (10 mg total) by mouth daily. 90 tablet 3   fluticasone (FLONASE) 50 MCG/ACT nasal spray USE 1 SPRAY(S) IN EACH NOSTRIL ONCE DAILY AS NEEDED FOR ALLERGIES OR RHINITIS 16 g 2   furosemide (LASIX) 40 MG tablet Take 1 tablet (40 mg total) by mouth daily. Take 1 tablet 40 mg 30 tablet 6   glucose blood (ACCU-CHEK GUIDE) test strip Use to measure blood sugar twice a day 100 each 6   Hyprom-Naphaz-Polysorb-Zn Sulf (CLEAR EYES COMPLETE) SOLN Apply 1 drop to eye 3 (three) times daily. 15 mL 1   loratadine (CLARITIN) 10 MG tablet Take 1 tablet (10 mg total) by mouth daily. 30 tablet 11   mometasone-formoterol (DULERA) 100-5 MCG/ACT AERO Inhale 2 puffs into the lungs 2 (two) times daily. (Patient taking differently: Inhale 2 puffs into the lungs 2 (two) times daily. As needed) 13 g 6   nitroGLYCERIN (NITROSTAT) 0.4 MG SL tablet Place 1 tablet (0.4 mg total) under the tongue every 5 (five) minutes x 3 doses as needed for chest pain. 25 tablet 3   omeprazole (PRILOSEC) 20 MG capsule Take 1 capsule by mouth once daily 30 capsule 11   pravastatin (PRAVACHOL) 80 MG tablet Take 1 tablet (80 mg total) by mouth every evening. 30 tablet 11   sacubitril-valsartan (ENTRESTO) 97-103 MG Take 1 tablet by mouth 2 (two) times daily. 60 tablet 4   sodium zirconium cyclosilicate (LOKELMA) 5 g packet Take 10 g by mouth daily. 30 each 11   ticagrelor (BRILINTA) 90 MG TABS tablet Take 1 tablet (90 mg total) by mouth 2 (two) times daily. 180 tablet 3   tiotropium (SPIRIVA HANDIHALER) 18 MCG inhalation capsule Place 1 capsule (18 mcg total) into inhaler and inhale daily. 30 capsule 6   traZODone (DESYREL) 50 MG tablet TAKE 1/2 TO 1 TABLET BY MOUTH AT BEDTIME 90 tablet 1   azithromycin (ZITHROMAX) 250 MG tablet Take 2 tabs PO day 1, then take 1 tab PO once daily 6 tablet 0    montelukast (SINGULAIR) 10 MG tablet TAKE 1 TABLET BY MOUTH AT BEDTIME 30 tablet 0   No facility-administered medications prior to visit.     Allergies:   Atorvastatin, Spironolactone, Metformin and related, and Other   Social History   Socioeconomic History   Marital status: Married  Spouse name: Not on file   Number of children: 1   Years of education: 8   Highest education level: 8th grade  Occupational History   Not on file  Tobacco Use   Smoking status: Former    Types: Cigarettes    Quit date: 04/08/2019    Years since quitting: 2.3    Passive exposure: Current (wife smokes)   Smokeless tobacco: Never  Vaping Use   Vaping Use: Never used  Substance and Sexual Activity   Alcohol use: No   Drug use: No   Sexual activity: Not Currently  Other Topics Concern   Not on file  Social History Narrative   Lives with wife   Right handed   Caffeine: about 2 cups of coffee every morning, no soda   Social Determinants of Health   Financial Resource Strain: Low Risk    Difficulty of Paying Living Expenses: Not very hard  Food Insecurity: No Food Insecurity   Worried About Charity fundraiser in the Last Year: Never true   Ran Out of Food in the Last Year: Never true  Transportation Needs: No Transportation Needs   Lack of Transportation (Medical): No   Lack of Transportation (Non-Medical): No  Physical Activity: Sufficiently Active   Days of Exercise per Week: 7 days   Minutes of Exercise per Session: 60 min  Stress: No Stress Concern Present   Feeling of Stress : Not at all  Social Connections: Moderately Isolated   Frequency of Communication with Friends and Family: More than three times a week   Frequency of Social Gatherings with Friends and Family: More than three times a week   Attends Religious Services: Never   Marine scientist or Organizations: No   Attends Music therapist: Never   Marital Status: Married     Family History:  The  patient's family history includes Heart failure in his mother; Hypertension in his mother.   ROS General: Negative; No fevers, chills, or night sweats;  HEENT: Negative; No changes in vision or hearing, sinus congestion, difficulty swallowing Pulmonary: Negative; No cough, wheezing, shortness of breath, hemoptysis Cardiovascular: The HPI GI: Negative; No nausea, vomiting, diarrhea, or abdominal pain GU: Negative; No dysuria, hematuria, or difficulty voiding Musculoskeletal: Negative; no myalgias, joint pain, or weakness Hematologic/Oncology: Negative; no easy bruising, bleeding Endocrine: Negative; no heat/cold intolerance; no diabetes Neuro: Negative; no changes in balance, headaches Skin: Negative; No rashes or skin lesions Psychiatric: Negative; No behavioral problems, depression Sleep: Negative; No snoring, daytime sleepiness, hypersomnolence, bruxism, restless legs, hypnogognic hallucinations, no cataplexy Other comprehensive 14 point system review is negative.   PHYSICAL EXAM:   VS:  BP 122/78    Pulse 67    Ht _0  (1.727 m)    Wt 202 lb 3.2 oz (91.7 kg)    SpO2 98%    BMI 30.74 kg/m     Repeat blood pressure by me was 122/72  Wt Readings from Last 3 Encounters:  08/04/21 202 lb 3.2 oz (91.7 kg)  07/27/21 215 lb (97.5 kg)  07/19/21 199 lb (90.3 kg)    General: Alert, oriented, no distress.  Skin: normal turgor, no rashes, warm and dry HEENT: Normocephalic, atraumatic. Pupils equal round and reactive to light; sclera anicteric; extraocular muscles intact;  Nose without nasal septal hypertrophy Mouth/Parynx benign; Mallinpatti scale 3 Neck: No JVD, no carotid bruits; normal carotid upstroke Lungs: clear to ausculatation and percussion; no wheezing or rales Chest wall: without tenderness to  palpitation Heart: PMI not displaced, RRR, s1 s2 normal, 1/6 systolic murmur, no diastolic murmur, no rubs, gallops, thrills, or heaves Abdomen: soft, nontender; no hepatosplenomehaly,  BS+; abdominal aorta nontender and not dilated by palpation. Back: no CVA tenderness Pulses 2+ Musculoskeletal: full range of motion, normal strength, no joint deformities Extremities: no clubbing cyanosis or edema, Homan's sign negative  Neurologic: grossly nonfocal; Cranial nerves grossly wnl Psychologic: Normal mood and affect   Studies/Labs Reviewed:   August 04, 2021 ECG (independently read by me):  NSR at 67, QS V1-2, PRWP V1-5; T wave abnormality anteriorly  March 24, 2021 ECG (independently read by me):  NSR at 81; Biatrial enlargement, QS V1-2, normal intervals  Recent Labs: BMP Latest Ref Rng & Units 07/27/2021 05/25/2021 05/07/2021  Glucose 70 - 99 mg/dL 96 122(H) 127(H)  BUN 8 - 23 mg/dL _0 Creatinine 0.61 - 1.24 mg/dL 0.91 1.19 1.32(H)  BUN/Creat Ratio 10 - 24 - - -  Sodium 135 - 145 mmol/L 138 141 138  Potassium 3.5 - 5.1 mmol/L 3.6 3.8 5.0  Chloride 98 - 111 mmol/L 105 107 108  CO2 22 - 32 mmol/L _1 Calcium 8.9 - 10.3 mg/dL 9.0 9.5 9.4     Hepatic Function Latest Ref Rng & Units 07/27/2021 05/25/2021 03/24/2021  Total Protein 6.5 - 8.1 g/dL 6.0(L) 6.7 7.8  Albumin 3.5 - 5.0 g/dL 3.6 4.0 5.3(H)  AST 15 - 41 U/L 35 33 32  ALT 0 - 44 U/L 75(H) 29 40  Alk Phosphatase 38 - 126 U/L 67 76 109  Total Bilirubin 0.3 - 1.2 mg/dL 0.5 0.6 0.9  Bilirubin, Direct 0.00 - 0.40 mg/dL - - 0.29    CBC Latest Ref Rng & Units 04/23/2021 02/06/2021 02/05/2021  WBC 3.4 - 10.8 x10E3/uL 7.2 10.7(H) 10.9(H)  Hemoglobin 13.0 - 17.7 g/dL 13.1 15.4 14.9  Hematocrit 37.5 - 51.0 % 39.9 47.0 43.8  Platelets 150 - 450 x10E3/uL 258 329 304   Lab Results  Component Value Date   MCV 90 04/23/2021   MCV 89.2 02/06/2021   MCV 88.8 02/05/2021   Lab Results  Component Value Date   TSH 0.784 02/02/2021   Lab Results  Component Value Date   HGBA1C 6.4 (H) 02/02/2021     BNP    Component Value Date/Time   BNP 415.9 (H) 07/27/2021 1040    ProBNP No results found for:  PROBNP   Lipid Panel     Component Value Date/Time   CHOL 98 07/27/2021 1040   CHOL 117 11/16/2020 0912   TRIG 30 07/27/2021 1040   HDL 56 07/27/2021 1040   HDL 37 (L) 11/16/2020 0912   CHOLHDL 1.8 07/27/2021 1040   VLDL 6 07/27/2021 1040   LDLCALC 36 07/27/2021 1040   LDLCALC 66 11/16/2020 0912   LABVLDL 14 11/16/2020 0912     RADIOLOGY: ECHOCARDIOGRAM COMPLETE  Result Date: 07/27/2021    ECHOCARDIOGRAM REPORT   Patient Name:   Stephen Jennings Date of Exam: 07/27/2021 Medical Rec #:  [id number]     Height:       68.0 in Accession #:    [id number]    Weight:       199.0 lb Date of Birth:  Oct 20, 1955     BSA:          2.039 m Patient Age:    63 years      BP:           109/74  mmHg Patient Gender: M             HR:           65 bpm. Exam Location:  Outpatient Procedure: 2D Echo Indications:    Congestive Heart Failure I50.9  History:        Patient has prior history of Echocardiogram examinations, most                 recent 02/02/2021. CHF, CAD; Risk Factors:Diabetes and                 Dyslipidemia.  Sonographer:    Mikki Santee RDCS Referring Phys: Ewa Gentry  1. Left ventricular ejection fraction, by estimation, is 35 to 40%. The left ventricle has moderately decreased function. The left ventricle demonstrates regional wall motion abnormalities (see scoring diagram/findings for description). The left ventricular internal cavity size was moderately dilated. Left ventricular diastolic parameters are consistent with Grade II diastolic dysfunction (pseudonormalization). There is akinesis of the left ventricular, anteroseptal wall.  2. Right ventricular systolic function is normal. The right ventricular size is normal.  3. Left atrial size was mildly dilated.  4. Right atrial size was mildly dilated.  5. The mitral valve is normal in structure. Mild mitral valve regurgitation. No evidence of mitral stenosis.  6. The aortic valve is tricuspid. Aortic valve regurgitation is  not visualized. No aortic stenosis is present.  7. The inferior vena cava is normal in size with greater than 50% respiratory variability, suggesting right atrial pressure of 3 mmHg. FINDINGS  Left Ventricle: Left ventricular ejection fraction, by estimation, is 35 to 40%. The left ventricle has moderately decreased function. The left ventricle demonstrates regional wall motion abnormalities. The left ventricular internal cavity size was moderately dilated. There is no left ventricular hypertrophy. Left ventricular diastolic parameters are consistent with Grade II diastolic dysfunction (pseudonormalization). Right Ventricle: The right ventricular size is normal. No increase in right ventricular wall thickness. Right ventricular systolic function is normal. Left Atrium: Left atrial size was mildly dilated. Right Atrium: Right atrial size was mildly dilated. Pericardium: There is no evidence of pericardial effusion. Mitral Valve: The mitral valve is normal in structure. There is mild calcification of the mitral valve leaflet(s). Mild mitral valve regurgitation. No evidence of mitral valve stenosis. Tricuspid Valve: The tricuspid valve is normal in structure. Tricuspid valve regurgitation is not demonstrated. No evidence of tricuspid stenosis. Aortic Valve: The aortic valve is tricuspid. Aortic valve regurgitation is not visualized. No aortic stenosis is present. Pulmonic Valve: The pulmonic valve was not well visualized. Pulmonic valve regurgitation is not visualized. No evidence of pulmonic stenosis. Aorta: The aortic root is normal in size and structure. Venous: The inferior vena cava is normal in size with greater than 50% respiratory variability, suggesting right atrial pressure of 3 mmHg. IAS/Shunts: No atrial level shunt detected by color flow Doppler. Additional Comments: A device lead is visualized.  LEFT VENTRICLE PLAX 2D LVIDd:         6.40 cm      Diastology LVIDs:         4.90 cm      LV e' medial:    7.28  cm/s LV PW:         1.00 cm      LV E/e' medial:  13.7 LV IVS:        0.80 cm      LV e' lateral:   8.03 cm/s LVOT diam:  2.40 cm      LV E/e' lateral: 12.4 LV SV:         92 LV SV Index:   45 LVOT Area:     4.52 cm  LV Volumes (MOD) LV vol d, MOD A2C: 137.0 ml LV vol d, MOD A4C: 143.0 ml LV vol s, MOD A2C: 75.2 ml LV vol s, MOD A4C: 85.0 ml LV SV MOD A2C:     61.8 ml LV SV MOD A4C:     143.0 ml LV SV MOD BP:      59.1 ml RIGHT VENTRICLE RV S prime:     15.50 cm/s TAPSE (M-mode): 2.2 cm LEFT ATRIUM           Index        RIGHT ATRIUM           Index LA diam:      3.50 cm 1.72 cm/m   RA Area:     20.30 cm LA Vol (A4C): 51.4 ml 25.20 ml/m  RA Volume:   67.20 ml  32.95 ml/m  AORTIC VALVE LVOT Vmax:   112.00 cm/s LVOT Vmean:  68.000 cm/s LVOT VTI:    0.203 m  AORTA Ao Root diam: 3.00 cm MITRAL VALVE MV Area (PHT): 4.06 cm    SHUNTS MV Decel Time: 187 msec    Systemic VTI:  0.20 m MV E velocity: 99.70 cm/s  Systemic Diam: 2.40 cm MV A velocity: 64.50 cm/s MV E/A ratio:  1.55 [name] MD Electronically signed by [name] MD Signature Date/Time: 07/27/2021/10:10:23 AM    Final    CUP PACEART REMOTE DEVICE CHECK  Result Date: 07/21/2021 Scheduled remote reviewed. Normal device function.  Next remote 91 days. LR    Additional studies/ records that were reviewed today include:   Emergent cath :February 02, 2021 Prox RCA lesion is 20% stenosed. Prox LAD to Mid LAD lesion is 15% stenosed. 1st Diag lesion is 5% stenosed. Ost LAD to Prox LAD lesion is 20% stenosed. Dist LAD-1 lesion is 40% stenosed. Dist LAD-2 lesion is 80% stenosed. There is severe left ventricular systolic dysfunction. LV end diastolic pressure is severely elevated. The left ventricular ejection fraction is 25-35% by visual estimate.   Patent proximal LAD stent with mild 20% in-stent narrowing.  There is significant thrombus burden immediately after the stent extending into the diagonal vessel and mid LAD with TIMI-3  flow.  There is a 305 mid distal LAD stenosis and  distal 80% stenosis in a small caliber distal LAD.   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  The dominant RCA had improvement in the previous 70% stenosis which now appeared approximately 20%.  The RCA supplied the PDA and PLA vessel   Acute LV dysfunction with EF estimated at 25 to 30% with severe hypocontractility involving the mid distal anterolateral wall apex and apical inferior segment.  Elevated LVEDP at 29 mmHg.   RECOMMENDATION: Patient was started on Aggrastat in the catheterization laboratory and was bolused with Brilinta 180 mg orally.  He does not appear to have significant stenoses at the site of his thrombotic diagonal and LAD and plan continuation of Aggrastat for approximately 48 hours with restudy.  Of note, the patient had recently stopped his ticagrelor and a echo on December 14, 2020 had shown normalization of LV function without wall motion abnormalities.      02/04/2021: Repeat cath/PCI Ost LAD to Prox LAD  lesion is 20% stenosed. Prox LAD to Mid LAD lesion is 40% stenosed. Dist LAD-1 lesion is 40% stenosed. Dist LAD-2 lesion is 80% stenosed. Prox RCA lesion is 20% stenosed. Post intervention, there is a 0% residual stenosis. 1st Diag lesion is 5% stenosed.   Relook angiography of the left coronary system showed improvement in the previous thrombus burden in the diagonal and LAD distal stent and beyond but moderate residual thrombus was still present.   LVEDP 8 mmHg   Successful percutaneous coronary intervention of the LAD utilizing Pronto thrombectomy, intravascular ultrasound and ultimate PTCA of the distal stented segment and mid LAD with residual narrowing 0%.  Resolution of thrombus in the diagonal vessel with mild 5% narrowing.  Very mild thrombus distal stent in the region of the septal perforating artery.  There is brisk  TIMI-3 flow.   RECOMMENDATION: DAPT indefinitely.  Would continue Aggrastat for additional 24 to 36 hours.  GDMT Medical therapy for his LV function/ischemic cardiomyopathy.      Intervention     ECHO: July 27, 2021 IMPRESSIONS   1. Left ventricular ejection fraction, by estimation, is 35 to 40%. The  left ventricle has moderately decreased function. The left ventricle  demonstrates regional wall motion abnormalities (see scoring  diagram/findings for description). The left  ventricular internal cavity size was moderately dilated. Left ventricular  diastolic parameters are consistent with Grade II diastolic dysfunction  (pseudonormalization). There is akinesis of the left ventricular,  anteroseptal wall.   2. Right ventricular systolic function is normal. The right ventricular  size is normal.   3. Left atrial size was mildly dilated.   4. Right atrial size was mildly dilated.   5. The mitral valve is normal in structure. Mild mitral valve  regurgitation. No evidence of mitral stenosis.   6. The aortic valve is tricuspid. Aortic valve regurgitation is not  visualized. No aortic stenosis is present.   7. The inferior vena cava is normal in size with greater than 50%  respiratory variability, suggesting right atrial pressure of 3 mmHg.    ASSESSMENT:    1. Chronic systolic heart failure ([place])   2. Ischemic cardiomyopathy   3. Essential hypertension   4. STEMI involving left anterior descending coronary artery [facility]): August 2020; June 2022   5. CAD in native artery   6. Cardiac defibrillator in situ   7. Hyperlipidemia with target LDL less than 70   8. Type 2 diabetes mellitus without complication, unspecified whether long term insulin use (HCC)     PLAN:  Stephen Jennings is a 65 year old gentleman who has a history of COPD, type 2 diabetes mellitus, CAD, and had suffered an initial myocardial infarction complicated by respiratory failure and cardiogenic shock in June  2020 at which time he underwent successful PCI to totally occluded LAD.  He required Impella support and initial echo showed EF at 20 to 25%.  His hospital course was complicated by torsade for which she underwent placement of an ICD prior to discharge.  His LV function subsequently normalized by April 2022 at which time his Kary Kos was discontinued.  Unfortunately 2 months later he presented with ACS secondary to extensive thrombus burden in the LAD for which he was treated with Aggrastat and on relook 2 days later he underwent Pronto thrombectomy intravascular ultrasound and ultimate PTCA of the distal stented segment and mid LAD with residual narrowing of 0%.  Subsequently, he has been without anginal symptomatology.  He has been followed by pharmacy and  advanced heart failure clinic.  Presently he is now on maximum dose Entresto 97/103 mg twice a day, Lasix 40 mg daily, bisoprolol 5 mg daily, and Jardiance 10 mg.  He is no longer on spironolactone due to hyperkalemia.  He has not had any episodes of VT or AF on ICD interrogation.  He continues to be on DAPT with aspirin/Brilinta and should be on this indefinitely.  He had undergone recent laboratory which which now shows excellent LDL cholesterol at 36 and he continues to be on Zetia 10 mg and his pravastatin 80 mg.  AST is normal at 35 with mild elevation of ALT at 75.  BNP was 415 when seen by Dr. Haroldine Laws his Lasix dose was increased to daily from Monday Wednesday and Friday.  He has a follow-up appointment to see Dr. Haroldine Laws in April.  I will see him in July for follow-up Cardiologic evaluation   Medication Adjustments/Labs and Tests Ordered: Current medicines are reviewed at length with the patient today.  Concerns regarding medicines are outlined above.  Medication changes, Labs and Tests ordered today are listed in the Patient Instructions below. Patient Instructions  Medication Instructions:  The current medical regimen is effective;  continue  present plan and medications.  *If you need a refill on your cardiac medications before your next appointment, please call your pharmacy*   Follow-Up: At [facility], you and your health needs are our priority.  As part of our continuing mission to provide you with exceptional heart care, we have created designated Provider Care Teams.  These Care Teams include your primary Cardiologist (physician) and Advanced Practice Providers (APPs -  Physician Assistants and Nurse Practitioners) who all work together to provide you with the care you need, when you need it.  We recommend signing up for the patient portal called "MyChart".  Sign up information is provided on this After Visit Summary.  MyChart is used to connect with patients for Virtual Visits (Telemedicine).  Patients are able to view lab/test results, encounter notes, upcoming appointments, etc.  Non-urgent messages can be sent to your provider as well.   To learn more about what you can do with MyChart, go to [email or web address].    Your next appointment:   7 month(s) July 2023  The format for your next appointment:   In Person  Provider:   Shelva Majestic, MD       Signed, Shelva Majestic, MD  08/11/2021 2:40 PM    [place] [address] [place], [place], [place]  [address] Phone: [phone]

## 2021-08-08 ENCOUNTER — Other Ambulatory Visit: Payer: Self-pay | Admitting: Family Medicine

## 2021-08-08 DIAGNOSIS — J439 Emphysema, unspecified: Secondary | ICD-10-CM

## 2021-08-09 ENCOUNTER — Ambulatory Visit: Payer: Self-pay

## 2021-08-09 NOTE — Telephone Encounter (Signed)
°  Chief Complaint: sinus congestion Symptoms: congestion, nasal drainage, earache, eyes watery, sinus pressure/pain Frequency: >7 days Pertinent Negatives: Patient denies fever Disposition: [] ED /[] Urgent Care (no appt availability in office) / [x] Appointment(In office/virtual)/ []  [facility]/ [] Home Care/ [] Refused Recommended Disposition  Additional Notes: Pt was advised he can go to UC to be seen but wanted to be scheduled for appt on 08/11/21 at 1530. Advised him if he goes to UC prior to appt, call back to cancel appt. Pt verbalized understanding.    Reason for Disposition  [1] Taking antibiotic > 72 hours (3 days) AND [2] sinus pain not improved  Answer Assessment - Initial Assessment Questions 1. ANTIBIOTIC: "What antibiotic are you receiving?" "How many times per day?"     Zpac x 5 days 2. ONSET: "When was the antibiotic started?"     *No Answer* 3. PAIN: "How bad is the sinus pain?"   (Scale 1-10; mild, moderate or severe)   - MILD (1-3): doesn't interfere with normal activities    - MODERATE (4-7): interferes with normal activities (e.g., work or school) or awakens from sleep   - SEVERE (8-10): excruciating pain and patient unable to do any normal activities        10 4. FEVER: "Do you have a fever?" If Yes, ask: "What is it, how was it measured, and when did it start?"      No 5. SYMPTOMS: "Are there any other symptoms you're concerned about?" If Yes, ask: "When did it start?"     Earache, head congestion, eyes watering, nose stopped up. Blowing blood with nasal drainage  Protocols used: Sinus Infection on Antibiotic Follow-up Call-A-AH

## 2021-08-11 ENCOUNTER — Other Ambulatory Visit: Payer: Self-pay

## 2021-08-11 ENCOUNTER — Encounter: Payer: Self-pay | Admitting: Physician Assistant

## 2021-08-11 ENCOUNTER — Encounter: Payer: Self-pay | Admitting: Cardiovascular Disease

## 2021-08-11 ENCOUNTER — Ambulatory Visit: Payer: Medicare Other | Attending: Physician Assistant | Admitting: Physician Assistant

## 2021-08-11 VITALS — BP 124/77 | HR 79 | Ht 68.0 in | Wt 200.4 lb

## 2021-08-11 DIAGNOSIS — J3089 Other allergic rhinitis: Secondary | ICD-10-CM

## 2021-08-11 DIAGNOSIS — H6121 Impacted cerumen, right ear: Secondary | ICD-10-CM | POA: Diagnosis not present

## 2021-08-11 DIAGNOSIS — J439 Emphysema, unspecified: Secondary | ICD-10-CM

## 2021-08-11 DIAGNOSIS — J329 Chronic sinusitis, unspecified: Secondary | ICD-10-CM | POA: Diagnosis not present

## 2021-08-11 DIAGNOSIS — I255 Ischemic cardiomyopathy: Secondary | ICD-10-CM

## 2021-08-11 DIAGNOSIS — E1165 Type 2 diabetes mellitus with hyperglycemia: Secondary | ICD-10-CM

## 2021-08-11 LAB — POCT GLYCOSYLATED HEMOGLOBIN (HGB A1C): HbA1c, POC (controlled diabetic range): 6.7 % (ref 0.0–7.0)

## 2021-08-11 LAB — GLUCOSE, POCT (MANUAL RESULT ENTRY): POC Glucose: 150 mg/dl — AB (ref 70–99)

## 2021-08-11 MED ORDER — FLUTICASONE PROPIONATE 50 MCG/ACT NA SUSP
NASAL | 2 refills | Status: DC
  Filled 2021-08-11: qty 16, 30d supply, fill #0

## 2021-08-11 MED ORDER — ALBUTEROL SULFATE HFA 108 (90 BASE) MCG/ACT IN AERS
2.0000 | INHALATION_SPRAY | Freq: Four times a day (QID) | RESPIRATORY_TRACT | 2 refills | Status: DC | PRN
  Filled 2021-08-11: qty 18, 25d supply, fill #0

## 2021-08-11 MED ORDER — AMOXICILLIN-POT CLAVULANATE 875-125 MG PO TABS
1.0000 | ORAL_TABLET | Freq: Two times a day (BID) | ORAL | 0 refills | Status: DC
  Filled 2021-08-11: qty 20, 10d supply, fill #0

## 2021-08-11 NOTE — Progress Notes (Signed)
Patient complains of cold for 3 weeks. Feels like he has swimmer's ear. 3 months feels like it's not draining.

## 2021-08-11 NOTE — Progress Notes (Signed)
Patient ID: Stephen Jennings, male   DOB: 1955/10/30, 65 y.o.   MRN: [id number]   Stephen Jennings, is a 65 y.o. male  OIT:[id number]  RAX:[id number]  DOB - Jan 11, 1956  Chief Complaint  Patient presents with   Diabetes       Subjective:   Stephen Jennings is a 65 y.o. male here today for 3 week h/o sinus congestion and ear congestion and decreased hearing in R ear that is getting worse.  Some cough and wheezing, but mostly sinus congestion and pain/pressure.  Nasal discharge is greenish.  No fever.    No problems updated.  ALLERGIES: Allergies  Allergen Reactions   Atorvastatin     Muscle weakness and fatigue on 20m daily   Spironolactone     gynecomastia   Metformin And Related Anxiety and Palpitations   Other Anxiety and Palpitations    PAST MEDICAL HISTORY: Past Medical History:  Diagnosis Date   Cardiogenic shock (HCC)    CHF (congestive heart failure) (HCC)    Coronary artery disease    Diabetes mellitus without complication ([place]    Hyperlipidemia    Medical history non-contributory     MEDICATIONS AT HOME: Prior to Admission medications   Medication Sig Start Date End Date Taking? Authorizing Provider  Accu-Chek Softclix Lancets lancets Use to measure blood sugar twice a day 11/06/19  Yes JLadell Pier MD  amoxicillin-clavulanate (AUGMENTIN) 875-125 MG tablet Take 1 tablet by mouth 2 (two) times daily. 08/11/21  Yes MArgentina Donovan [name]  aspirin EC 81 MG tablet Take 1 tablet (81 mg total) by mouth daily. Swallow whole. 02/06/21 02/06/22 Yes Duke, ATami Lin PA  bisoprolol (ZEBETA) 5 MG tablet Take 1 tablet (5 mg total) by mouth daily. 04/06/21  Yes [place], JMaricela Bo [name]  Blood Glucose Monitoring Suppl (ACCU-CHEK GUIDE ME) w/Device KIT 1 kit by Does not apply route in the morning and at bedtime. Use to measure blood sugar twice a day 11/06/19  Yes JLadell Pier MD  co-enzyme Q-10 30 MG capsule Take 1 capsule (30 mg total) by mouth daily. 05/25/21  Yes  [place], JMaricela Bo [name]  empagliflozin (JARDIANCE) 10 MG TABS tablet Take 1 tablet (10 mg total) by mouth daily before breakfast. 02/15/21  Yes Bensimhon, DShaune Pascal MD  eplerenone (INSPRA) 25 MG tablet Take 0.5 tablets (12.5 mg total) by mouth daily. 04/27/21  Yes [place], JMaricela Bo [name]  ezetimibe (ZETIA) 10 MG tablet Take 1 tablet (10 mg total) by mouth daily. 02/06/21  Yes Duke, ATami Lin PA  fluticasone (FLONASE) 50 MCG/ACT nasal spray USE 1 SPRAY(S) IN EACH NOSTRIL ONCE DAILY AS NEEDED FOR ALLERGIES OR RHINITIS 08/11/21  Yes MFreeman CaldronM, [name]  furosemide (LASIX) 40 MG tablet Take 1 tablet (40 mg total) by mouth daily. Take 1 tablet 40 mg 07/27/21  Yes Bensimhon, DShaune Pascal MD  glucose blood (ACCU-CHEK GUIDE) test strip Use to measure blood sugar twice a day 11/06/19  Yes JLadell Pier MD  Hyprom-Naphaz-Polysorb-Zn Sulf (CLEAR EYES COMPLETE) SOLN Apply 1 drop to eye 3 (three) times daily. 05/08/19  Yes WElsie Stain MD  loratadine (CLARITIN) 10 MG tablet Take 1 tablet (10 mg total) by mouth daily. 01/19/21  Yes [name], [name], [name]  mometasone-formoterol (DULERA) 100-5 MCG/ACT AERO Inhale 2 puffs into the lungs 2 (two) times daily. Patient taking differently: Inhale 2 puffs into the lungs 2 (two) times daily. As needed 04/23/21  Yes JLadell Pier MD  montelukast (SINGULAIR) 10 MG  tablet TAKE 1 TABLET BY MOUTH AT BEDTIME 08/09/21  Yes [name], MD  nitroGLYCERIN (NITROSTAT) 0.4 MG SL tablet Place 1 tablet (0.4 mg total) under the tongue every 5 (five) minutes x 3 doses as needed for chest pain. 02/06/21  Yes Duke, Tami Lin, PA  omeprazole (PRILOSEC) 20 MG capsule Take 1 capsule by mouth once daily 05/07/21  Yes Ladell Pier, MD  pravastatin (PRAVACHOL) 80 MG tablet Take 1 tablet (80 mg total) by mouth every evening. 05/25/21 05/25/22 Yes Milford, [name], [name]  sacubitril-valsartan (ENTRESTO) 97-103 MG Take 1 tablet by mouth 2 (two) times daily. 04/06/21  Yes Milford,  [name], [name]  sodium zirconium cyclosilicate (LOKELMA) 5 g packet Take 10 g by mouth daily. 05/06/21  Yes Milford, [name], [name]  ticagrelor (BRILINTA) 90 MG TABS tablet Take 1 tablet (90 mg total) by mouth 2 (two) times daily. 02/06/21  Yes Duke, Tami Lin, PA  tiotropium (SPIRIVA HANDIHALER) 18 MCG inhalation capsule Place 1 capsule (18 mcg total) into inhaler and inhale daily. 04/23/21  Yes Ladell Pier, MD  traZODone (DESYREL) 50 MG tablet TAKE 1/2 TO 1 TABLET BY MOUTH AT BEDTIME 05/04/21  Yes Ladell Pier, MD  albuterol (VENTOLIN HFA) 108 (90 Base) MCG/ACT inhaler Inhale 2 puffs into the lungs every 6 (six) hours as needed for wheezing or shortness of breath. 08/11/21   Argentina Donovan, [name]    ROS: Neg cardiac Neg GI Neg GU Neg MS Neg psych Neg neuro  Objective:   Vitals:   08/11/21 1515  BP: 124/77  Pulse: 79  SpO2: 97%  Weight: 200 lb 6.4 oz (90.9 kg)  Height: 5' 8"  (1.727 m)   Exam General appearance : Awake, alert, not in any distress. Speech Clear. Not toxic looking HEENT: Atraumatic and Normocephalic.  L TM bulging, R TM occluded with cerumen Neck: Supple, no JVD. No cervical lymphadenopathy.  Chest: Good air entry bilaterally, CTAB.  No rales/rhonchi.  There is mild wheezing throughout CVS: S1 S2 regular, no murmurs.  Extremities: B/L Lower Ext shows no edema, both legs are warm to touch Neurology: Awake alert, and oriented X 3, CN II-XII intact, Non focal Skin: No Rash  Data Review Lab Results  Component Value Date   HGBA1C 6.7 08/11/2021   HGBA1C 6.4 (H) 02/02/2021   HGBA1C 6.2 (H) 06/08/2020    Assessment & Plan   1. Controlled type 2 diabetes mellitus with hyperglycemia, without long-term current use of insulin ([place]) I have had a lengthy discussion and provided education about insulin resistance and the intake of too much sugar/refined carbohydrates.  I have advised the patient to work at a goal of eliminating sugary drinks, candy,  desserts, sweets, refined sugars, processed foods, and white carbohydrates.  The patient expresses understanding.  -continue current regimen; work harder on dietary choices.  Patient verbalized understanding - Glucose (CBG) - HgB A1c  2. Impacted cerumen of right ear Use wax softening drops in R ear the night before next visit  3. Sinusitis, unspecified chronicity, unspecified location - amoxicillin-clavulanate (AUGMENTIN) 875-125 MG tablet; Take 1 tablet by mouth 2 (two) times daily.  Dispense: 20 tablet; Refill: 0 - albuterol (VENTOLIN HFA) 108 (90 Base) MCG/ACT inhaler; Inhale 2 puffs into the lungs every 6 (six) hours as needed for wheezing or shortness of breath.  Dispense: 18 g; Refill: 2  4. Non-seasonal allergic rhinitis, unspecified trigger - fluticasone (FLONASE) 50 MCG/ACT nasal spray; USE 1 SPRAY(S) IN EACH NOSTRIL ONCE DAILY  AS NEEDED FOR ALLERGIES OR RHINITIS  Dispense: 16 g; Refill: 2  5. Pulmonary emphysema, unspecified emphysema type (HCC) - albuterol (VENTOLIN HFA) 108 (90 Base) MCG/ACT inhaler; Inhale 2 puffs into the lungs every 6 (six) hours as needed for wheezing or shortness of breath.  Dispense: 18 g; Refill: 2    Patient have been counseled extensively about nutrition and exercise. Other issues discussed during this visit include: low cholesterol diet, weight control and daily exercise, foot care, annual eye examinations at Ophthalmology, importance of adherence with medications and regular follow-up. We also discussed long term complications of uncontrolled diabetes and hypertension.   Return in about 3 weeks (around 09/01/2021) for nurse visit for cerumen impaction; PCP in 2-3 months.  The patient was given clear instructions to go to ER or return to medical center if symptoms don't improve, worsen or new problems develop. The patient verbalized understanding. The patient was told to call to get lab results if they haven't heard anything in the next week.       Freeman Caldron, [name] [facility] and [facility] [place], [place]   08/11/2021, 3:42 PM

## 2021-08-11 NOTE — Patient Instructions (Signed)
Use ear wax softener drops the night before your next visit

## 2021-08-23 ENCOUNTER — Emergency Department (HOSPITAL_BASED_OUTPATIENT_CLINIC_OR_DEPARTMENT_OTHER): Payer: Medicare HMO | Admitting: Radiology

## 2021-08-23 ENCOUNTER — Encounter (HOSPITAL_BASED_OUTPATIENT_CLINIC_OR_DEPARTMENT_OTHER): Payer: Self-pay | Admitting: Obstetrics and Gynecology

## 2021-08-23 ENCOUNTER — Other Ambulatory Visit: Payer: Self-pay

## 2021-08-23 ENCOUNTER — Emergency Department (HOSPITAL_BASED_OUTPATIENT_CLINIC_OR_DEPARTMENT_OTHER)
Admission: EM | Admit: 2021-08-23 | Discharge: 2021-08-23 | Disposition: A | Discharge status: Home / Self Care | Payer: Medicare HMO | Attending: Emergency Medicine | Admitting: Emergency Medicine

## 2021-08-23 DIAGNOSIS — Z7951 Long term (current) use of inhaled steroids: Secondary | ICD-10-CM | POA: Insufficient documentation

## 2021-08-23 DIAGNOSIS — Z7984 Long term (current) use of oral hypoglycemic drugs: Secondary | ICD-10-CM | POA: Diagnosis not present

## 2021-08-23 DIAGNOSIS — I2510 Atherosclerotic heart disease of native coronary artery without angina pectoris: Secondary | ICD-10-CM | POA: Diagnosis not present

## 2021-08-23 DIAGNOSIS — J441 Chronic obstructive pulmonary disease with (acute) exacerbation: Secondary | ICD-10-CM

## 2021-08-23 DIAGNOSIS — Z79899 Other long term (current) drug therapy: Secondary | ICD-10-CM | POA: Diagnosis not present

## 2021-08-23 DIAGNOSIS — Z7982 Long term (current) use of aspirin: Secondary | ICD-10-CM | POA: Insufficient documentation

## 2021-08-23 DIAGNOSIS — I509 Heart failure, unspecified: Secondary | ICD-10-CM | POA: Diagnosis not present

## 2021-08-23 DIAGNOSIS — E119 Type 2 diabetes mellitus without complications: Secondary | ICD-10-CM | POA: Diagnosis not present

## 2021-08-23 DIAGNOSIS — R059 Cough, unspecified: Secondary | ICD-10-CM | POA: Diagnosis not present

## 2021-08-23 DIAGNOSIS — U071 COVID-19: Secondary | ICD-10-CM | POA: Diagnosis not present

## 2021-08-23 LAB — RESP PANEL BY RT-PCR (FLU A&B, COVID) ARPGX2
Influenza A by PCR: NEGATIVE
Influenza B by PCR: NEGATIVE
SARS Coronavirus 2 by RT PCR: POSITIVE — AB

## 2021-08-23 MED ORDER — IPRATROPIUM-ALBUTEROL 0.5-2.5 (3) MG/3ML IN SOLN
3.0000 mL | Freq: Once | RESPIRATORY_TRACT | Status: AC
  Administered 2021-08-23: 3 mL via RESPIRATORY_TRACT

## 2021-08-23 MED ORDER — IPRATROPIUM BROMIDE HFA 17 MCG/ACT IN AERS
2.0000 | INHALATION_SPRAY | Freq: Once | RESPIRATORY_TRACT | Status: AC
  Administered 2021-08-23: 2 via RESPIRATORY_TRACT
  Filled 2021-08-23: qty 12.9

## 2021-08-23 MED ORDER — IPRATROPIUM BROMIDE 0.02 % IN SOLN
0.5000 mg | Freq: Once | RESPIRATORY_TRACT | Status: AC
  Administered 2021-08-23: 0.5 mg via RESPIRATORY_TRACT
  Filled 2021-08-23: qty 2.5

## 2021-08-23 MED ORDER — MAGNESIUM SULFATE 2 GM/50ML IV SOLN
2.0000 g | Freq: Once | INTRAVENOUS | Status: AC
  Administered 2021-08-23: 2 g via INTRAVENOUS
  Filled 2021-08-23: qty 50

## 2021-08-23 MED ORDER — ALBUTEROL SULFATE HFA 108 (90 BASE) MCG/ACT IN AERS
2.0000 | INHALATION_SPRAY | RESPIRATORY_TRACT | Status: DC | PRN
  Administered 2021-08-23: 2 via RESPIRATORY_TRACT
  Filled 2021-08-23: qty 6.7

## 2021-08-23 MED ORDER — ALBUTEROL SULFATE (2.5 MG/3ML) 0.083% IN NEBU: 2.5000 mg | INHALATION_SOLUTION | Freq: Four times a day (QID) | RESPIRATORY_TRACT | 12 refills | Status: AC | PRN

## 2021-08-23 MED ORDER — METHYLPREDNISOLONE SODIUM SUCC 125 MG IJ SOLR
125.0000 mg | Freq: Once | INTRAMUSCULAR | Status: AC
Start: 2021-08-23 — End: 2021-08-23
  Administered 2021-08-23: 125 mg via INTRAVENOUS
  Filled 2021-08-23: qty 2

## 2021-08-23 MED ORDER — IPRATROPIUM-ALBUTEROL 0.5-2.5 (3) MG/3ML IN SOLN
RESPIRATORY_TRACT | Status: AC
  Filled 2021-08-23: qty 3

## 2021-08-23 MED ORDER — ALBUTEROL SULFATE (2.5 MG/3ML) 0.083% IN NEBU
INHALATION_SOLUTION | RESPIRATORY_TRACT | Status: AC
  Administered 2021-08-23: 2.5 mg via RESPIRATORY_TRACT
  Filled 2021-08-23: qty 3

## 2021-08-23 MED ORDER — ALBUTEROL SULFATE (2.5 MG/3ML) 0.083% IN NEBU
5.0000 mg | INHALATION_SOLUTION | Freq: Once | RESPIRATORY_TRACT | Status: AC
  Administered 2021-08-23: 5 mg via RESPIRATORY_TRACT
  Filled 2021-08-23: qty 6

## 2021-08-23 MED ORDER — ALBUTEROL (5 MG/ML) CONTINUOUS INHALATION SOLN
15.0000 mg/h | INHALATION_SOLUTION | Freq: Once | RESPIRATORY_TRACT | Status: AC
Start: 2021-08-23 — End: 2021-08-23
  Administered 2021-08-23: 15 mg/h via RESPIRATORY_TRACT
  Filled 2021-08-23: qty 20

## 2021-08-23 MED ORDER — ALBUTEROL SULFATE (2.5 MG/3ML) 0.083% IN NEBU: 2.5000 mg | INHALATION_SOLUTION | Freq: Once | RESPIRATORY_TRACT | Status: AC

## 2021-08-23 MED ORDER — PREDNISONE 20 MG PO TABS: 40.0000 mg | ORAL_TABLET | Freq: Every day | ORAL | 0 refills | Status: DC

## 2021-08-23 NOTE — Discharge Instructions (Signed)
Use the Atrovent(ipratropium) inhaler 2 puffs 2 times a day for the next 3 days.  You can use the albuterol nebulizer or inhaler as needed every 4-6 hours.  Start taking your steroid tomorrow.  If your breathing becomes worse return to the emergency room as you will need admission.

## 2021-08-23 NOTE — ED Triage Notes (Signed)
Patient reports to the ER for cough and congestion x3 weeks. Patient reports he is congested heavily in the evening and needs some relief.

## 2021-08-23 NOTE — ED Provider Notes (Signed)
[facility] EMERGENCY DEPT Provider Note   CSN: [id number] Arrival date & time: 08/23/21  1328     History  Chief Complaint  Patient presents with   Cough    Stephen Jennings is a 66 y.o. male.  Patient is a 66 year old male with a history of CHF, diabetes, CAD, long-term tobacco use who quit 4 years ago who is presenting today with complaint of cough, wheezing and shortness of breath.  Patient reports he has had URI symptoms now present for the last 3 weeks.  He has had a course of azithromycin and prednisone approximately 3 weeks ago and had some improvement and then symptoms returned.  He was seen on 08/12/2021 at a clinic and was given Augmentin and an inhaler.  He reports that he did not really improve from this and now things are getting worse.  He is starting to feel more short of breath with any exertion and it is very severe when he lays down at night.  He has been using his wife's oxygen which does seem to help his breathing.  He has never used inhalers regularly and does not use oxygen at baseline.  He reports right now he does not feel terribly short of breath but just feels tight like it is hard to breathe.  He has not been running fever.  He did have labs checked a few weeks ago and at that time his BNP was elevated in the 400s from his baseline of 200 and he was increased on Lasix.  He has not noticed any swelling in his leg and denies any chest pain at this time.  The history is provided by the patient and medical records.  Cough Cough characteristics:  Harsh, productive and hacking Sputum characteristics:  Yellow Severity:  Moderate Onset quality:  Gradual Duration:  3 weeks Timing:  Constant Progression:  Worsening Chronicity:  Recurrent Context: upper respiratory infection   Relieved by: Better when he uses his wife's oxygen at night. Worsened by:  Lying down Ineffective treatments:  Beta-agonist inhaler Associated symptoms: rhinorrhea, shortness of  breath and wheezing   Associated symptoms: no chest pain, no fever, no myalgias and no rash       Home Medications Prior to Admission medications   Medication Sig Start Date End Date Taking? Authorizing Provider  Accu-Chek Softclix Lancets lancets Use to measure blood sugar twice a day 11/06/19   Ladell Pier, MD  albuterol (VENTOLIN HFA) 108 (90 Base) MCG/ACT inhaler Inhale 2 puffs into the lungs every 6 (six) hours as needed for wheezing or shortness of breath. 08/11/21   Argentina Donovan, [name]  amoxicillin-clavulanate (AUGMENTIN) 875-125 MG tablet Take 1 tablet by mouth 2 (two) times daily. 08/11/21   Argentina Donovan, [name]  aspirin EC 81 MG tablet Take 1 tablet (81 mg total) by mouth daily. Swallow whole. 02/06/21 02/06/22  Duke, Tami Lin, PA  bisoprolol (ZEBETA) 5 MG tablet Take 1 tablet (5 mg total) by mouth daily. 04/06/21   Rafael Bihari, [name]  Blood Glucose Monitoring Suppl (ACCU-CHEK GUIDE ME) w/Device KIT 1 kit by Does not apply route in the morning and at bedtime. Use to measure blood sugar twice a day 11/06/19   Ladell Pier, MD  co-enzyme Q-10 30 MG capsule Take 1 capsule (30 mg total) by mouth daily. 05/25/21   Milford, [name], [name]  empagliflozin (JARDIANCE) 10 MG TABS tablet Take 1 tablet (10 mg total) by mouth daily before breakfast. 02/15/21   Bensimhon,  Shaune Pascal, MD  eplerenone (INSPRA) 25 MG tablet Take 0.5 tablets (12.5 mg total) by mouth daily. 04/27/21   Milford, [name], [name]  ezetimibe (ZETIA) 10 MG tablet Take 1 tablet (10 mg total) by mouth daily. 02/06/21   Duke, Tami Lin, PA  fluticasone (FLONASE) 50 MCG/ACT nasal spray USE 1 SPRAY(S) IN EACH NOSTRIL ONCE DAILY AS NEEDED FOR ALLERGIES OR RHINITIS 08/11/21   Argentina Donovan, [name]  furosemide (LASIX) 40 MG tablet Take 1 tablet (40 mg total) by mouth daily. Take 1 tablet 40 mg 07/27/21   Bensimhon, Shaune Pascal, MD  glucose blood (ACCU-CHEK GUIDE) test strip Use to measure blood sugar twice a day  11/06/19   Ladell Pier, MD  Hyprom-Naphaz-Polysorb-Zn Sulf (CLEAR EYES COMPLETE) SOLN Apply 1 drop to eye 3 (three) times daily. 05/08/19   [name], MD  loratadine (CLARITIN) 10 MG tablet Take 1 tablet (10 mg total) by mouth daily. 01/19/21   [name], [name], [name]  mometasone-formoterol (DULERA) 100-5 MCG/ACT AERO Inhale 2 puffs into the lungs 2 (two) times daily. Patient taking differently: Inhale 2 puffs into the lungs 2 (two) times daily. As needed 04/23/21   Ladell Pier, MD  montelukast (SINGULAIR) 10 MG tablet TAKE 1 TABLET BY MOUTH AT BEDTIME 08/09/21   [name], MD  nitroGLYCERIN (NITROSTAT) 0.4 MG SL tablet Place 1 tablet (0.4 mg total) under the tongue every 5 (five) minutes x 3 doses as needed for chest pain. 02/06/21   Duke, Tami Lin, PA  omeprazole (PRILOSEC) 20 MG capsule Take 1 capsule by mouth once daily 05/07/21   Ladell Pier, MD  pravastatin (PRAVACHOL) 80 MG tablet Take 1 tablet (80 mg total) by mouth every evening. 05/25/21 05/25/22  Rafael Bihari, [name]  sacubitril-valsartan (ENTRESTO) 97-103 MG Take 1 tablet by mouth 2 (two) times daily. 04/06/21   Milford, [name], [name]  sodium zirconium cyclosilicate (LOKELMA) 5 g packet Take 10 g by mouth daily. 05/06/21   Rafael Bihari, [name]  ticagrelor (BRILINTA) 90 MG TABS tablet Take 1 tablet (90 mg total) by mouth 2 (two) times daily. 02/06/21   Duke, Tami Lin, PA  tiotropium (SPIRIVA HANDIHALER) 18 MCG inhalation capsule Place 1 capsule (18 mcg total) into inhaler and inhale daily. 04/23/21   Ladell Pier, MD  traZODone (DESYREL) 50 MG tablet TAKE 1/2 TO 1 TABLET BY MOUTH AT BEDTIME 05/04/21   Ladell Pier, MD      Allergies    Atorvastatin, Spironolactone, Metformin and related, and Other    Review of Systems   Review of Systems  Constitutional:  Negative for fever.  HENT:  Positive for rhinorrhea.   Respiratory:  Positive for cough, shortness of breath and wheezing.    Cardiovascular:  Negative for chest pain.  Musculoskeletal:  Negative for myalgias.  Skin:  Negative for rash.  All other systems reviewed and are negative.  Physical Exam Updated Vital Signs BP 136/87 (BP Location: Right Arm)    Pulse 75    Temp 98.6 F (37 C)    Resp 18    SpO2 97%  Physical Exam Vitals and nursing note reviewed.  Constitutional:      General: He is not in acute distress.    Appearance: He is well-developed.  HENT:     Head: Normocephalic and atraumatic.     Right Ear: Tympanic membrane normal.     Left Ear: Tympanic membrane normal.     Nose: Congestion present.  Mouth/Throat:     Mouth: Mucous membranes are moist.  Eyes:     Conjunctiva/sclera: Conjunctivae normal.     Pupils: Pupils are equal, round, and reactive to light.  Cardiovascular:     Rate and Rhythm: Normal rate and regular rhythm.     Heart sounds: No murmur heard. Pulmonary:     Effort: Pulmonary effort is normal. No respiratory distress.     Breath sounds: Wheezing present. No rales.  Chest:     Chest wall: No tenderness.  Abdominal:     General: There is no distension.     Palpations: Abdomen is soft.     Tenderness: There is no abdominal tenderness. There is no guarding or rebound.  Musculoskeletal:        General: No tenderness. Normal range of motion.     Cervical back: Normal range of motion and neck supple.  Skin:    General: Skin is warm and dry.     Findings: No erythema or rash.  Neurological:     Mental Status: He is alert and oriented to person, place, and time. Mental status is at baseline.  Psychiatric:        Mood and Affect: Mood normal.        Behavior: Behavior normal.    ED Results / Procedures / Treatments   Labs (all labs ordered are listed, but only abnormal results are displayed) Labs Reviewed  RESP PANEL BY RT-PCR (FLU A&B, COVID) ARPGX2 - Abnormal; Notable for the following components:      Result Value   SARS Coronavirus 2 by RT PCR POSITIVE (*)     All other components within normal limits    EKG None  Radiology DG Chest 2 View  Result Date: 08/23/2021 CLINICAL DATA:  Cough. EXAM: CHEST - 2 VIEW COMPARISON:  04/06/2021 FINDINGS: Stable position of the left chest dual chamber cardiac pacemaker. Heart size is normal. Evidence for hyperinflation. Chronic linear densities in the anterior chest on the lateral view. No large pleural effusions. Probable scarring near the left cardiac border. IMPRESSION: Chronic lung changes without acute findings. Electronically Signed   By: Markus Daft M.D.   On: 08/23/2021 14:36    Procedures Procedures    Medications Ordered in ED Medications  albuterol (PROVENTIL) (2.5 MG/3ML) 0.083% nebulizer solution 5 mg (has no administration in time range)  ipratropium (ATROVENT) nebulizer solution 0.5 mg (has no administration in time range)  methylPREDNISolone sodium succinate (SOLU-MEDROL) 125 mg/2 mL injection 125 mg (has no administration in time range)  magnesium sulfate IVPB 2 g 50 mL (has no administration in time range)  ipratropium-albuterol (DUONEB) 0.5-2.5 (3) MG/3ML nebulizer solution 3 mL (3 mLs Nebulization Given 08/23/21 1420)  albuterol (PROVENTIL) (2.5 MG/3ML) 0.083% nebulizer solution 2.5 mg (2.5 mg Nebulization Given 08/23/21 1420)  ipratropium-albuterol (DUONEB) 0.5-2.5 (3) MG/3ML nebulizer solution (  Not Given 08/23/21 1430)    ED Course/ Medical Decision Making/ A&P                           Medical Decision Making  Elderly gentleman with URI symptoms present for the last 3 weeks who is presenting today with worsening issues with shortness of breath and cough.  He has been treated with 2 courses of antibiotics and had prednisone approximately 3 weeks ago.  He has been using his wife's oxygen at night because he is more short of breath with lying down.  On exam patient does not  appear to have fluid overload.  No swelling in the legs or JVD.  He has a wheezing diffusely and did test positive  for COVID.  However feel that he is most likely 3 weeks out from a COVID infection.  He has not had recent new symptoms everything has been ongoing.  I have evaluated and independently interpreted chest x-ray without evidence of pneumonia today and agree with radiologist findings.  Patient is otherwise well-appearing and satting 97% on room air.  We will give albuterol, Atrovent, Solu-Medrol and magnesium.  Will reevaluate.  8:47 PM After initial albuterol and Atrovent patient is still wheezing.  He had an hour-long continuous neb and sounds about the same.  His sats still remain in the high 90s.  He continues to wheeze.  Patient was offered admission however at this point he has a debilitated wife that he has to go home to.  We will give patient albuterol, Atrovent and steroid prescription.  He was instructed to return for worsening symptoms.  Patient does have an illness currently that could get worse and cause hypoxia and respiratory distress.  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  He was encouraged to return for worsening symptoms.  CRITICAL CARE Performed by: Lacey Wallman Total critical care time: 30 minutes Critical care time was exclusive of separately billable procedures and treating other patients. Critical care was necessary to treat or prevent imminent or life-threatening deterioration. Critical care was time spent personally by me on the following activities: development of treatment plan with patient and/or surrogate as well as nursing, discussions with consultants, evaluation of patient's response to treatment, examination of patient, obtaining history from patient or surrogate, ordering and performing treatments and interventions, ordering and review of laboratory studies, ordering and review of radiographic studies, pulse oximetry and re-evaluation of patient's condition.         Final Clinical  Impression(s) / ED Diagnoses Final diagnoses:  COVID  Chronic obstructive pulmonary disease with acute exacerbation ([place])    Rx / DC Orders ED Discharge Orders          Ordered    albuterol (PROVENTIL) (2.5 MG/3ML) 0.083% nebulizer solution  Every 6 hours PRN        08/23/21 2054    predniSONE (DELTASONE) 20 MG tablet  Daily        08/23/21 2054              Blanchie Dessert, MD 08/23/21 2054

## 2021-08-24 ENCOUNTER — Telehealth: Payer: Self-pay

## 2021-08-24 NOTE — Telephone Encounter (Signed)
Transition Care Management Unsuccessful Follow-up Telephone Call  Date of discharge and from where:  08/23/2021-[facility]  Attempts:  1st Attempt  Reason for unsuccessful TCM follow-up call:  Left voice message

## 2021-08-25 NOTE — Telephone Encounter (Signed)
Transition Care Management Unsuccessful Follow-up Telephone Call  Date of discharge and from where:  08/23/2021[facility]  Attempts:  2nd Attempt  Reason for unsuccessful TCM follow-up call:  Unable to reach patient

## 2021-08-26 ENCOUNTER — Telehealth: Payer: Self-pay | Admitting: Internal Medicine

## 2021-08-26 ENCOUNTER — Ambulatory Visit: Payer: Medicaid Other | Admitting: Internal Medicine

## 2021-08-26 DIAGNOSIS — J439 Emphysema, unspecified: Secondary | ICD-10-CM

## 2021-08-26 NOTE — Telephone Encounter (Signed)
Copied from [place] [id number]. Topic: Referral - Request for Referral >> Aug 25, 2021 11:11 AM Yvette Rack wrote: Has patient seen PCP for this complaint? Yes.   *If NO, is insurance requiring patient see PCP for this issue before PCP can refer them? Referral for which specialty: Lung specialist Preferred provider/office: Dr. Shanda Bumps on [facility] Reason for referral: pt told a new referral is needed

## 2021-08-26 NOTE — Telephone Encounter (Signed)
Will forward to provider  

## 2021-08-26 NOTE — Telephone Encounter (Signed)
Transition Care Management Unsuccessful Follow-up Telephone Call  Date of discharge and from where:  08/23/2021 from [facility]  Attempts:  3rd Attempt  Reason for unsuccessful TCM follow-up call:  Unable to reach patient

## 2021-09-02 ENCOUNTER — Ambulatory Visit: Payer: Medicare HMO | Admitting: Pharmacist

## 2021-09-05 ENCOUNTER — Other Ambulatory Visit: Payer: Self-pay | Admitting: Family Medicine

## 2021-09-05 DIAGNOSIS — J439 Emphysema, unspecified: Secondary | ICD-10-CM

## 2021-09-07 ENCOUNTER — Other Ambulatory Visit: Payer: Self-pay | Admitting: Internal Medicine

## 2021-10-04 ENCOUNTER — Encounter: Payer: Self-pay | Admitting: Pulmonary Disease

## 2021-10-04 ENCOUNTER — Ambulatory Visit (INDEPENDENT_AMBULATORY_CARE_PROVIDER_SITE_OTHER): Payer: Medicare HMO | Admitting: Pulmonary Disease

## 2021-10-04 ENCOUNTER — Other Ambulatory Visit (INDEPENDENT_AMBULATORY_CARE_PROVIDER_SITE_OTHER): Payer: Medicare HMO

## 2021-10-04 ENCOUNTER — Other Ambulatory Visit: Payer: Self-pay

## 2021-10-04 VITALS — BP 126/74 | HR 86 | Temp 98.5°F | Ht 68.0 in | Wt 204.2 lb

## 2021-10-04 DIAGNOSIS — R0609 Other forms of dyspnea: Secondary | ICD-10-CM

## 2021-10-04 LAB — CBC WITH DIFFERENTIAL/PLATELET
Basophils Absolute: 0.1 10*3/uL (ref 0.0–0.1)
Basophils Relative: 0.8 % (ref 0.0–3.0)
Eosinophils Absolute: 0.3 10*3/uL (ref 0.0–0.7)
Eosinophils Relative: 3.3 % (ref 0.0–5.0)
HCT: 41 % (ref 39.0–52.0)
Hemoglobin: 13.6 g/dL (ref 13.0–17.0)
Lymphocytes Relative: 16.6 % (ref 12.0–46.0)
Lymphs Abs: 1.3 10*3/uL (ref 0.7–4.0)
MCHC: 33.3 g/dL (ref 30.0–36.0)
MCV: 88.4 fl (ref 78.0–100.0)
Monocytes Absolute: 0.8 10*3/uL (ref 0.1–1.0)
Monocytes Relative: 9.8 % (ref 3.0–12.0)
Neutro Abs: 5.6 10*3/uL (ref 1.4–7.7)
Neutrophils Relative %: 69.5 % (ref 43.0–77.0)
Platelets: 225 10*3/uL (ref 150.0–400.0)
RBC: 4.63 Mil/uL (ref 4.22–5.81)
RDW: 15.9 % — ABNORMAL HIGH (ref 11.5–15.5)
WBC: 8.1 10*3/uL (ref 4.0–10.5)

## 2021-10-04 LAB — BRAIN NATRIURETIC PEPTIDE: Pro B Natriuretic peptide (BNP): 189 pg/mL — ABNORMAL HIGH (ref 0.0–100.0)

## 2021-10-04 MED ORDER — BREZTRI AEROSPHERE 160-9-4.8 MCG/ACT IN AERO: 2.0000 | INHALATION_SPRAY | Freq: Two times a day (BID) | RESPIRATORY_TRACT | 11 refills | Status: DC

## 2021-10-04 NOTE — Progress Notes (Signed)
@Patient  ID: Stephen Jennings, male    DOB: [birth year], 66 y.o.   MRN: [id number]  Chief Complaint  Patient presents with   Follow-up    Follo wup from last year. Pt states the SOB is not doing well. Pt states he is on the albuterol as needed and that it does help and on Spriva daily.     Referring provider: Ladell Pier, MD  HPI:   66 y.o. whom we are seeing in follow-up for evaluation of dyspnea on exertion.  Most recent cardiology note x2 reviewed. Patient was doing well.  At last visit, no further testing or medications are recommended given he was essentially asymptomatic from respiratory standpoint.  Unfortunately, he developed shortness of breath over the last 2 or 3 months.  Worse after COVID infection 08/2021.  Taking several inhalers, Dulera seems to be more helpful than the Spiriva.  Ruthe Mannan is mid dose in terms of ICS dosing.  Reviewed recent TTE 07/2021 following about 6 months after heart procedure shows persistent reduced EF and new left atrial dilation compared to TTE 01/2021.  HPI initial visit: Patient notes several weeks ago he felt ill.  Cough, sore throat, runny nose.  Felt like his virus.  Felt more rundown, short of breath during that time.  Gradually symptoms seem to slowly improve.  However had residual dyspnea on exertion.  Worse with inclines or stairs.  Better with rest.  No environmental or seasonal changes he can identify to contribute.  Did not really try much to make things better or worse.  Cannot identify clear exacerbating or alleviating factors.  Also/13, he developed chest pain with radiation down left arm while doing yard work.  Presented to the ED. Troponins elevated. Medical therapy for NSTEMI started. LHC 6/14 with narrowing in prior stent in LAD. Placed on aggrastat and bolused brilinta. Repeat LHC 6/16 with improved narrowing/thrombus in prior stent. TTE with new reduced EF compared to 10/2020. Diuresed, and started on lasix daily. He states this has  helped immensely with DOE. Denies any respiratory issues. Denies any limitations due to dyspnea.  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  Working without issue.  CXR 02/01/21 on my review and interpretation with hyperinflation on lateral view, bronchitic changes bilaterally.  PMH: tobacco abuse in remission, CAD s/p DES Surgical history: cardiac cath Family History: CHF, HTN in mom Social history: former smoker, quit 2020 at time of first heart attack, 40 pack year history, lives in [place] / Pulmonary Flowsheets:   ACT:  No flowsheet data found.  MMRC: No flowsheet data found.  Epworth:  No flowsheet data found.  Tests:   FENO:  No results found for: NITRICOXIDE  PFT: No flowsheet data found.  WALK:  No flowsheet data found.  Imaging: No results found.  Lab Results: Personally reviewed and as per per EMR, eos 300 2020 CBC    Component Value Date/Time   WBC 7.2 04/23/2021 1025   WBC 10.7 (H) 02/06/2021 0311   RBC 4.46 04/23/2021 1025   RBC 5.27 02/06/2021 0311   HGB 13.1 04/23/2021 1025   HCT 39.9 04/23/2021 1025   PLT 258 04/23/2021 1025   MCV 90 04/23/2021 1025   MCH 29.4 04/23/2021 1025   MCH 29.2 02/06/2021 0311   MCHC 32.8 04/23/2021 1025   MCHC 32.8 02/06/2021 0311   RDW 14.1 04/23/2021 1025   LYMPHSABS 0.9 04/09/2019 0615   MONOABS 0.9 04/09/2019 0615   EOSABS 0.3 04/09/2019  0615   BASOSABS 0.0 04/09/2019 0615    BMET    Component Value Date/Time   NA 138 07/27/2021 1040   NA 141 03/04/2021 1328   K 3.6 07/27/2021 1040   CL 105 07/27/2021 1040   CO2 25 07/27/2021 1040   GLUCOSE 96 07/27/2021 1040   BUN 15 07/27/2021 1040   BUN 21 03/04/2021 1328   CREATININE 0.91 07/27/2021 1040   CALCIUM 9.0 07/27/2021 1040   GFRNONAA >60 07/27/2021 1040   GFRAA 99 06/08/2020 1003    BNP    Component Value Date/Time   BNP 415.9 (H) 07/27/2021 1040    ProBNP No results found for: PROBNP  Specialty Problems        Pulmonary Problems   Snoring   SOB (shortness of breath)   Upper respiratory tract infection   Pulmonary emphysema (HCC)   COPD with acute exacerbation (HCC)   Non-seasonal allergic rhinitis    Allergies  Allergen Reactions   Atorvastatin     Muscle weakness and fatigue on 84m daily   Spironolactone     gynecomastia   Metformin And Related Anxiety and Palpitations   Other Anxiety and Palpitations    Immunization History  Administered Date(s) Administered   Influenza,inj,Quad PF,6+ Mos 05/08/2019, 06/08/2020, 04/23/2021   PFIZER(Purple Top)SARS-COV-2 Vaccination 11/04/2019, 11/25/2019   PNEUMOCOCCAL CONJUGATE-20 04/23/2021   Pneumococcal Polysaccharide-23 06/05/2019   Tetanus 05/08/2019    Past Medical History:  Diagnosis Date   Cardiogenic shock (HCC)    [place]F ([place]estive heart failure) [place]    Coronary artery disease    Diabetes mellitus without complication ([place]    Hyperlipidemia    Medical history non-contributory     Tobacco History: Social History   Tobacco Use  Smoking Status Former   Types: Cigarettes   Quit date: 04/08/2019   Years since quitting: 2.4   Passive exposure: Current (wife smokes)  Smokeless Tobacco Never   Counseling given: Not Answered   Continue to not smoke  Outpatient Encounter Medications as of 10/04/2021  Medication Sig   Accu-Chek Softclix Lancets lancets Use to measure blood sugar twice a day   albuterol (PROVENTIL) (2.5 MG/3ML) 0.083% nebulizer solution Take 3 mLs (2.5 mg total) by nebulization every 6 (six) hours as needed for wheezing or shortness of breath.   albuterol (VENTOLIN HFA) 108 (90 Base) MCG/ACT inhaler Inhale 2 puffs into the lungs every 6 (six) hours as needed for wheezing or shortness of breath.   aspirin EC 81 MG tablet Take 1 tablet (81 mg total) by mouth daily. Swallow whole.   bisoprolol (ZEBETA) 5 MG tablet Take 1 tablet (5 mg total) by mouth daily.   Blood Glucose Monitoring Suppl (ACCU-CHEK GUIDE  ME) w/Device KIT 1 kit by Does not apply route in the morning and at bedtime. Use to measure blood sugar twice a day   Budeson-Glycopyrrol-Formoterol (BREZTRI AEROSPHERE) 160-9-4.8 MCG/ACT AERO Inhale 2 puffs into the lungs 2 (two) times daily.   co-enzyme Q-10 30 MG capsule Take 1 capsule (30 mg total) by mouth daily.   empagliflozin (JARDIANCE) 10 MG TABS tablet Take 1 tablet (10 mg total) by mouth daily before breakfast.   eplerenone (INSPRA) 25 MG tablet Take 0.5 tablets (12.5 mg total) by mouth daily.   ezetimibe (ZETIA) 10 MG tablet Take 1 tablet (10 mg total) by mouth daily.   fluticasone (FLONASE) 50 MCG/ACT nasal spray USE 1 SPRAY(S) IN EACH NOSTRIL ONCE DAILY AS NEEDED FOR ALLERGIES OR RHINITIS   furosemide (LASIX) 40  MG tablet Take 1 tablet (40 mg total) by mouth daily. Take 1 tablet 40 mg   glucose blood (ACCU-CHEK GUIDE) test strip Use to measure blood sugar twice a day   Hyprom-Naphaz-Polysorb-Zn Sulf (CLEAR EYES COMPLETE) SOLN Apply 1 drop to eye 3 (three) times daily.   loratadine (CLARITIN) 10 MG tablet Take 1 tablet (10 mg total) by mouth daily.   montelukast (SINGULAIR) 10 MG tablet TAKE 1 TABLET BY MOUTH AT BEDTIME   nitroGLYCERIN (NITROSTAT) 0.4 MG SL tablet Place 1 tablet (0.4 mg total) under the tongue every 5 (five) minutes x 3 doses as needed for chest pain.   omeprazole (PRILOSEC) 20 MG capsule Take 1 capsule by mouth once daily   pravastatin (PRAVACHOL) 80 MG tablet Take 1 tablet (80 mg total) by mouth every evening.   sacubitril-valsartan (ENTRESTO) 97-103 MG Take 1 tablet by mouth 2 (two) times daily.   sodium zirconium cyclosilicate (LOKELMA) 5 g packet Take 10 g by mouth daily.   ticagrelor (BRILINTA) 90 MG TABS tablet Take 1 tablet (90 mg total) by mouth 2 (two) times daily.   traZODone (DESYREL) 50 MG tablet TAKE 1/2 TO 1 TABLET BY MOUTH AT BEDTIME   [DISCONTINUED] amoxicillin-clavulanate (AUGMENTIN) 875-125 MG tablet Take 1 tablet by mouth 2 (two) times daily.    [DISCONTINUED] mometasone-formoterol (DULERA) 100-5 MCG/ACT AERO Inhale 2 puffs into the lungs 2 (two) times daily. (Patient taking differently: Inhale 2 puffs into the lungs 2 (two) times daily. As needed)   [DISCONTINUED] predniSONE (DELTASONE) 20 MG tablet Take 2 tablets (40 mg total) by mouth daily. Take the 63m po for 7 days, then take 1 tab (264m po for 4 days and then take 1/2 tab (1092mpo for 4 days.   [DISCONTINUED] tiotropium (SPIRIVA HANDIHALER) 18 MCG inhalation capsule Place 1 capsule (18 mcg total) into inhaler and inhale daily.   No facility-administered encounter medications on file as of 10/04/2021.     Review of Systems  Review of Systems  Denies any current chest pain with exertion, no orthopnea or PND, comprehensive review of systems otherwise negative.  Physical Exam  BP 126/74 (BP Location: Left Arm, Patient Position: Sitting, Cuff Size: Normal)    Pulse 86    Temp 98.5 F (36.9 C) (Oral)    Ht 5' 8"  (1.727 m)    Wt 204 lb 3.2 oz (92.6 kg)    SpO2 98%    BMI 31.05 kg/m   Wt Readings from Last 5 Encounters:  10/04/21 204 lb 3.2 oz (92.6 kg)  08/11/21 200 lb 6.4 oz (90.9 kg)  08/04/21 202 lb 3.2 oz (91.7 kg)  07/27/21 215 lb (97.5 kg)  07/19/21 199 lb (90.3 kg)    BMI Readings from Last 5 Encounters:  10/04/21 31.05 kg/m  08/11/21 30.47 kg/m  08/04/21 30.74 kg/m  07/27/21 32.69 kg/m  07/19/21 30.26 kg/m     Physical Exam General: well appearing, in NAD Eyes: EOMI, no icterus  Neck: Supple, no JVP Cardiovascular: Regular rate and rhythm, normal Pulmonary: Clear to auscultation bilaterally, distant, normal work of breathing MSK: No synovitis, no joint effusion Neuro: Normal gait, no weakness Psych: Normal mood, full affect   Assessment & Plan:   DOE: Likely component of cardiac cause given rising BNP and new left atrial dilation.  Improved with Dulera, worse when outdoors was argues for possible asthma.  At risk for cigarette related lung  disease as well.  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  Also on Spiriva.  Simplified to high-dose Breztri which increases ICS dosing compared to mid dose Dulera he was on prior.  PFTs as above.  Eosinophils as high as 500 in the past, more recently 300.  Repeat CBC with differential, IgE, allergen panel for phenotyping for consideration of Biologics in the future.  CXR hyperinflation: Raises high suspicion for fixed obstruction.  PFTs as above.   Return in about 3 months (around 01/01/2022).   Lanier Clam, MD 10/04/2021

## 2021-10-04 NOTE — Patient Instructions (Signed)
Nice to see you again  Stop Dulera and Spiriva  Start Breztri 2 puffs twice a day every day, rinse your mouth out after every use  If too expensive please let me know.  If too expensive discontinue Dulera and Spiriva until we can find an alternative.  I ordered pulmonary function test to be performed next available  I ordered blood work today, you will need to go to the [facility] building on [address] to have these drawn  Blood work to look at stretch on the heart as well as to see if you would be a candidate for more aggressive asthma treatment in the future  Return to clinic in 3 months or sooner as needed with Dr. Silas Flood

## 2021-10-05 LAB — IGE: IgE (Immunoglobulin E), Serum: 37 kU/L (ref <=114)

## 2021-10-07 ENCOUNTER — Other Ambulatory Visit: Payer: Self-pay

## 2021-10-07 ENCOUNTER — Ambulatory Visit (INDEPENDENT_AMBULATORY_CARE_PROVIDER_SITE_OTHER): Payer: Medicare HMO | Admitting: Pulmonary Disease

## 2021-10-07 DIAGNOSIS — R0609 Other forms of dyspnea: Secondary | ICD-10-CM

## 2021-10-07 LAB — PULMONARY FUNCTION TEST
DL/VA % pred: 82 %
DL/VA: 3.43 ml/min/mmHg/L
DLCO cor % pred: 80 %
DLCO cor: 21.56 ml/min/mmHg
DLCO unc % pred: 78 %
DLCO unc: 20.92 ml/min/mmHg
FEF 25-75 Post: 1.57 L/sec
FEF 25-75 Pre: 0.85 L/sec
FEF2575-%Change-Post: 85 %
FEF2575-%Pred-Post: 57 %
FEF2575-%Pred-Pre: 31 %
FEV1-%Change-Post: 27 %
FEV1-%Pred-Post: 60 %
FEV1-%Pred-Pre: 47 %
FEV1-Post: 2.07 L
FEV1-Pre: 1.62 L
FEV1FVC-%Change-Post: 5 %
FEV1FVC-%Pred-Pre: 78 %
FEV6-%Change-Post: 18 %
FEV6-%Pred-Post: 74 %
FEV6-%Pred-Pre: 62 %
FEV6-Post: 3.26 L
FEV6-Pre: 2.74 L
FEV6FVC-%Change-Post: -2 %
FEV6FVC-%Pred-Post: 102 %
FEV6FVC-%Pred-Pre: 104 %
FVC-%Change-Post: 21 %
FVC-%Pred-Post: 73 %
FVC-%Pred-Pre: 60 %
FVC-Post: 3.36 L
FVC-Pre: 2.77 L
Post FEV1/FVC ratio: 62 %
Post FEV6/FVC ratio: 97 %
Pre FEV1/FVC ratio: 58 %
Pre FEV6/FVC Ratio: 99 %
RV % pred: 227 %
RV: 5.34 L
TLC % pred: 118 %
TLC: 8.34 L

## 2021-10-07 NOTE — Progress Notes (Signed)
PFTs demonstrate evidence of COPD but also shows significant improvement after albuterol which is suggestive of asthma.  I am hopeful to change to Stephen Jennings will help him in his breathing.

## 2021-10-07 NOTE — Progress Notes (Signed)
PFT done today. 

## 2021-10-10 LAB — ALLERGEN PROFILE, PERENNIAL ALLERGEN IGE

## 2021-10-20 ENCOUNTER — Ambulatory Visit (INDEPENDENT_AMBULATORY_CARE_PROVIDER_SITE_OTHER): Payer: Medicare HMO

## 2021-10-20 DIAGNOSIS — I255 Ischemic cardiomyopathy: Secondary | ICD-10-CM | POA: Diagnosis not present

## 2021-10-20 LAB — CUP PACEART REMOTE DEVICE CHECK
Battery Remaining Longevity: 74 mo
Battery Remaining Percentage: 76 %
Battery Voltage: 2.99 V
Brady Statistic AP VP Percent: 1 %
Brady Statistic AP VS Percent: 2.1 %
Brady Statistic AS VP Percent: 1 %
Brady Statistic AS VS Percent: 98 %
Brady Statistic RA Percent Paced: 1.9 %
Brady Statistic RV Percent Paced: 1 %
Date Time Interrogation Session: 20230301040027
HighPow Impedance: 72 Ohm
HighPow Impedance: 72 Ohm
Implantable Lead Implant Date: 20200824
Implantable Lead Implant Date: 20200824
Implantable Lead Location: 753859
Implantable Lead Location: 753860
Implantable Pulse Generator Implant Date: 20200824
Lead Channel Impedance Value: 340 Ohm
Lead Channel Impedance Value: 360 Ohm
Lead Channel Pacing Threshold Amplitude: 0.75 V
Lead Channel Pacing Threshold Amplitude: 0.75 V
Lead Channel Pacing Threshold Pulse Width: 0.5 ms
Lead Channel Pacing Threshold Pulse Width: 0.5 ms
Lead Channel Sensing Intrinsic Amplitude: 1.1 mV
Lead Channel Sensing Intrinsic Amplitude: 11.9 mV
Lead Channel Setting Pacing Amplitude: 2 V
Lead Channel Setting Pacing Amplitude: 2.5 V
Lead Channel Setting Pacing Pulse Width: 0.5 ms
Lead Channel Setting Sensing Sensitivity: 0.5 mV
Pulse Gen Serial Number: 1300954

## 2021-10-27 NOTE — Progress Notes (Signed)
Remote ICD transmission.   

## 2021-11-04 DIAGNOSIS — H25813 Combined forms of age-related cataract, bilateral: Secondary | ICD-10-CM | POA: Diagnosis not present

## 2021-11-04 DIAGNOSIS — H524 Presbyopia: Secondary | ICD-10-CM | POA: Diagnosis not present

## 2021-11-04 DIAGNOSIS — R7303 Prediabetes: Secondary | ICD-10-CM | POA: Diagnosis not present

## 2021-11-04 DIAGNOSIS — H5213 Myopia, bilateral: Secondary | ICD-10-CM | POA: Diagnosis not present

## 2021-11-04 DIAGNOSIS — H52203 Unspecified astigmatism, bilateral: Secondary | ICD-10-CM | POA: Diagnosis not present

## 2022-01-03 ENCOUNTER — Encounter: Payer: Self-pay | Admitting: Pulmonary Disease

## 2022-01-03 ENCOUNTER — Ambulatory Visit (INDEPENDENT_AMBULATORY_CARE_PROVIDER_SITE_OTHER): Payer: Medicare HMO | Admitting: Pulmonary Disease

## 2022-01-03 DIAGNOSIS — J439 Emphysema, unspecified: Secondary | ICD-10-CM | POA: Diagnosis not present

## 2022-01-03 DIAGNOSIS — R0609 Other forms of dyspnea: Secondary | ICD-10-CM

## 2022-01-03 DIAGNOSIS — J329 Chronic sinusitis, unspecified: Secondary | ICD-10-CM

## 2022-01-03 MED ORDER — BREZTRI AEROSPHERE 160-9-4.8 MCG/ACT IN AERO: 2.0000 | INHALATION_SPRAY | Freq: Two times a day (BID) | RESPIRATORY_TRACT | 11 refills | Status: DC

## 2022-01-03 MED ORDER — MONTELUKAST SODIUM 10 MG PO TABS: 10.0000 mg | ORAL_TABLET | Freq: Every day | ORAL | 11 refills | Status: DC

## 2022-01-03 MED ORDER — LORATADINE 10 MG PO TABS: 10.0000 mg | ORAL_TABLET | Freq: Every day | ORAL | 11 refills | Status: DC

## 2022-01-03 MED ORDER — ALBUTEROL SULFATE HFA 108 (90 BASE) MCG/ACT IN AERS: 2.0000 | INHALATION_SPRAY | Freq: Four times a day (QID) | RESPIRATORY_TRACT | 3 refills | Status: DC | PRN

## 2022-01-03 NOTE — Progress Notes (Signed)
? ?@Patient  ID: Stephen Jennings, male    DOB: November 01, 1955, 66 y.o.   MRN: [id number] ? ?Chief Complaint  ?Patient presents with  ? Follow-up  ?  Pt is here due to DOE. Pt states he is still running out of energy even when his inhalers are opening him up. Pt is on Breztri daily and Albuterol. And he is on Singulair and Claritin for allergies.   ? ? ?Referring provider: ?Ladell Pier, MD ? ?HPI:  ? ?66 y.o. whom we are seeing in follow-up for evaluation of dyspnea on exertion.   ? ?Since last visit, PFT performed.  These were reviewed today and interpreted as part are consistent with fixed obstruction with improvement after significant bronchodilator response.  Discussed at length why inhaler therapy was chosen.  He endorses more air, improved breathing on Breztri.  He feels fatigued, rundown.  Feel he needs take breaks.  Does use albuterol as needed with some improvement.  He reports relatively rare use, couple times during the week.  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 ? ?HPI initial visit: ?Patient notes several weeks ago he felt ill.  Cough, sore throat, runny nose.  Felt like his virus.  Felt more rundown, short of breath during that time.  Gradually symptoms seem to slowly improve.  However had residual dyspnea on exertion.  Worse with inclines or stairs.  Better with rest.  No environmental or seasonal changes he can identify to contribute.  Did not really try much to make things better or worse.  Cannot identify clear exacerbating or alleviating factors.  Also/13, he developed chest pain with radiation down left arm while doing yard work.  Presented to the ED. Troponins elevated. Medical therapy for NSTEMI started. LHC 6/14 with narrowing in prior stent in LAD. Placed on aggrastat and bolused brilinta. Repeat LHC 6/16 with improved narrowing/thrombus in prior stent. TTE with new reduced EF compared to 10/2020. Diuresed, and started on lasix daily. He states this has  helped immensely with DOE. Denies any respiratory issues. Denies any limitations due to dyspnea.  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  Working without issue. ? ?CXR 02/01/21 on my review and interpretation with hyperinflation on lateral view, bronchitic changes bilaterally. ? ?PMH: tobacco abuse in remission, CAD s/p DES ?Surgical history: cardiac cath ?Family History: CHF, HTN in mom ?Social history: former smoker, quit 2020 at time of first heart attack, 40 pack year history, lives in [place] ? ?Questionaires / Pulmonary Flowsheets:  ? ?ACT:  ?   ? View : No data to display.  ?  ?  ?  ? ? ?MMRC: ?   ? View : No data to display.  ?  ?  ?  ? ? ?Epworth:  ?   ? View : No data to display.  ?  ?  ?  ? ? ?Tests:  ? ?FENO:  ?No results found for: NITRICOXIDE ? ?PFT: ? ?  Latest Ref Rng & Units 10/07/2021  ?  9:44 AM  ?PFT Results  ?FVC-Pre L 2.77    ?FVC-Predicted Pre % 60    ?FVC-Post L 3.36    ?FVC-Predicted Post % 73    ?Pre FEV1/FVC % % 58    ?Post FEV1/FCV % % 62    ?FEV1-Pre L 1.62    ?FEV1-Predicted Pre % 47    ?FEV1-Post L 2.07    ?DLCO uncorrected ml/min/mmHg 20.92    ?DLCO UNC% % 78    ?  DLCO corrected ml/min/mmHg 21.56    ?DLCO COR %Predicted % 80    ?DLVA Predicted % 82    ?TLC L 8.34    ?TLC % Predicted % 118    ?RV % Predicted % 227    ?Personally reviewed and interpreted as severe fixed obstruction with significant improvement after significant bronchodilator response.  Lung volumes consistent with air trapping. ?WALK:  ?   ? View : No data to display.  ?  ?  ?  ? ? ?Imaging: ?No results found. ? ?Lab Results: ?Personally reviewed and as per per EMR, eos 300 2020 ?CBC ?   ?Component Value Date/Time  ? WBC 8.1 10/04/2021 1617  ? RBC 4.63 10/04/2021 1617  ? HGB 13.6 10/04/2021 1617  ? HGB 13.1 04/23/2021 1025  ? HCT 41.0 10/04/2021 1617  ? HCT 39.9 04/23/2021 1025  ? PLT 225.0 10/04/2021 1617  ? PLT 258 04/23/2021 1025  ? MCV 88.4 10/04/2021 1617  ? MCV 90 04/23/2021 1025  ? MCH 29.4  04/23/2021 1025  ? MCH 29.2 02/06/2021 0311  ? MCHC 33.3 10/04/2021 1617  ? RDW 15.9 (H) 10/04/2021 1617  ? RDW 14.1 04/23/2021 1025  ? LYMPHSABS 1.3 10/04/2021 1617  ? MONOABS 0.8 10/04/2021 1617  ? EOSABS 0.3 10/04/2021 1617  ? BASOSABS 0.1 10/04/2021 1617  ? ? ?BMET ?   ?Component Value Date/Time  ? NA 138 07/27/2021 1040  ? NA 141 03/04/2021 1328  ? K 3.6 07/27/2021 1040  ? CL 105 07/27/2021 1040  ? CO2 25 07/27/2021 1040  ? GLUCOSE 96 07/27/2021 1040  ? BUN 15 07/27/2021 1040  ? BUN 21 03/04/2021 1328  ? CREATININE 0.91 07/27/2021 1040  ? CALCIUM 9.0 07/27/2021 1040  ? GFRNONAA >60 07/27/2021 1040  ? GFRAA 99 06/08/2020 1003  ? ? ?BNP ?   ?Component Value Date/Time  ? BNP 415.9 (H) 07/27/2021 1040  ? ? ?ProBNP ?   ?Component Value Date/Time  ? PROBNP 189.0 (H) 10/04/2021 1617  ? ? ?Specialty Problems   ? ?  ? Pulmonary Problems  ? Snoring  ? SOB (shortness of breath)  ? Upper respiratory tract infection  ? Pulmonary emphysema ([place])  ? COPD with acute exacerbation ([place])  ? Non-seasonal allergic rhinitis  ? ? ?Allergies  ?Allergen Reactions  ? Atorvastatin   ?  Muscle weakness and fatigue on 83m daily  ? Spironolactone   ?  gynecomastia  ? Metformin And Related Anxiety and Palpitations  ? Other Anxiety and Palpitations  ? ? ?Immunization History  ?Administered Date(s) Administered  ? Influenza,inj,Quad PF,6+ Mos 05/08/2019, 06/08/2020, 04/23/2021  ? PFIZER(Purple Top)SARS-COV-2 Vaccination 11/04/2019, 11/25/2019  ? PNEUMOCOCCAL CONJUGATE-20 04/23/2021  ? Pneumococcal Polysaccharide-23 06/05/2019  ? Tetanus 05/08/2019  ? ? ?Past Medical History:  ?Diagnosis Date  ? Cardiogenic shock ([facility]   ? CHF (congestive heart failure) ([facility]   ? Coronary artery disease   ? [place] mellitus without complication [place]   ? Hyperlipidemia   ? Medical history non-contributory   ? ? ?Tobacco History: ?Social History  ? ?Tobacco Use  ?Smoking Status Former  ? Types: Cigarettes  ? Quit date: 04/08/2019  ? Years since quitting: 2.7  ?  Passive exposure: Current (wife smokes)  ?Smokeless Tobacco Never  ? ?Counseling given: Not Answered ? ? ?Continue to not smoke ? ?Outpatient Encounter Medications as of 01/03/2022  ?Medication Sig  ? Accu-Chek Softclix Lancets lancets Use to measure blood sugar twice a day  ? albuterol (PROVENTIL) (2.5 MG/3ML)  0.083% nebulizer solution Take 3 mLs (2.5 mg total) by nebulization every 6 (six) hours as needed for wheezing or shortness of breath.  ? aspirin EC 81 MG tablet Take 1 tablet (81 mg total) by mouth daily. Swallow whole.  ? bisoprolol (ZEBETA) 5 MG tablet Take 1 tablet (5 mg total) by mouth daily.  ? Blood Glucose Monitoring Suppl (ACCU-CHEK GUIDE ME) w/Device KIT 1 kit by Does not apply route in the morning and at bedtime. Use to measure blood sugar twice a day  ? co-enzyme Q-10 30 MG capsule Take 1 capsule (30 mg total) by mouth daily.  ? empagliflozin (JARDIANCE) 10 MG TABS tablet Take 1 tablet (10 mg total) by mouth daily before breakfast.  ? eplerenone (INSPRA) 25 MG tablet Take 0.5 tablets (12.5 mg total) by mouth daily.  ? ezetimibe (ZETIA) 10 MG tablet Take 1 tablet (10 mg total) by mouth daily.  ? fluticasone (FLONASE) 50 MCG/ACT nasal spray USE 1 SPRAY(S) IN EACH NOSTRIL ONCE DAILY AS NEEDED FOR ALLERGIES OR RHINITIS  ? furosemide (LASIX) 40 MG tablet Take 1 tablet (40 mg total) by mouth daily. Take 1 tablet 40 mg  ? glucose blood (ACCU-CHEK GUIDE) test strip Use to measure blood sugar twice a day  ? Hyprom-Naphaz-Polysorb-Zn Sulf (CLEAR EYES COMPLETE) SOLN Apply 1 drop to eye 3 (three) times daily.  ? nitroGLYCERIN (NITROSTAT) 0.4 MG SL tablet Place 1 tablet (0.4 mg total) under the tongue every 5 (five) minutes x 3 doses as needed for chest pain.  ? omeprazole (PRILOSEC) 20 MG capsule Take 1 capsule by mouth once daily  ? pravastatin (PRAVACHOL) 80 MG tablet Take 1 tablet (80 mg total) by mouth every evening.  ? sacubitril-valsartan (ENTRESTO) 97-103 MG Take 1 tablet by mouth 2 (two) times daily.   ? sodium zirconium cyclosilicate (LOKELMA) 5 g packet Take 10 g by mouth daily.  ? ticagrelor (BRILINTA) 90 MG TABS tablet Take 1 tablet (90 mg total) by mouth 2 (two) times daily.  ? traZODone (DESY

## 2022-01-03 NOTE — Patient Instructions (Signed)
Nice to see you ? ?No medication changes ? ?Refills sent today ? ?Stay active like you are! ? ?Return to clinic in 4 months or sooner as needed ?

## 2022-01-08 ENCOUNTER — Other Ambulatory Visit: Payer: Self-pay | Admitting: Internal Medicine

## 2022-01-08 DIAGNOSIS — I10 Essential (primary) hypertension: Secondary | ICD-10-CM

## 2022-01-10 NOTE — Telephone Encounter (Signed)
Pt. No longer on medication.

## 2022-01-18 ENCOUNTER — Other Ambulatory Visit: Payer: Self-pay | Admitting: Internal Medicine

## 2022-01-18 DIAGNOSIS — I10 Essential (primary) hypertension: Secondary | ICD-10-CM

## 2022-01-19 ENCOUNTER — Ambulatory Visit (INDEPENDENT_AMBULATORY_CARE_PROVIDER_SITE_OTHER): Payer: Medicare HMO

## 2022-01-19 DIAGNOSIS — I255 Ischemic cardiomyopathy: Secondary | ICD-10-CM | POA: Diagnosis not present

## 2022-01-20 ENCOUNTER — Other Ambulatory Visit: Payer: Self-pay | Admitting: Internal Medicine

## 2022-01-20 DIAGNOSIS — I10 Essential (primary) hypertension: Secondary | ICD-10-CM

## 2022-01-20 LAB — CUP PACEART REMOTE DEVICE CHECK
Battery Remaining Longevity: 73 mo
Battery Remaining Percentage: 74 %
Battery Voltage: 2.98 V
Brady Statistic AP VP Percent: 1 %
Brady Statistic AP VS Percent: 2 %
Brady Statistic AS VP Percent: 1 %
Brady Statistic AS VS Percent: 98 %
Brady Statistic RA Percent Paced: 1.8 %
Brady Statistic RV Percent Paced: 1 %
Date Time Interrogation Session: 20230531040015
HighPow Impedance: 81 Ohm
HighPow Impedance: 81 Ohm
Implantable Lead Implant Date: 20200824
Implantable Lead Implant Date: 20200824
Implantable Lead Location: 753859
Implantable Lead Location: 753860
Implantable Pulse Generator Implant Date: 20200824
Lead Channel Impedance Value: 330 Ohm
Lead Channel Impedance Value: 360 Ohm
Lead Channel Pacing Threshold Amplitude: 0.75 V
Lead Channel Pacing Threshold Amplitude: 0.75 V
Lead Channel Pacing Threshold Pulse Width: 0.5 ms
Lead Channel Pacing Threshold Pulse Width: 0.5 ms
Lead Channel Sensing Intrinsic Amplitude: 1.2 mV
Lead Channel Sensing Intrinsic Amplitude: 11.9 mV
Lead Channel Setting Pacing Amplitude: 2 V
Lead Channel Setting Pacing Amplitude: 2.5 V
Lead Channel Setting Pacing Pulse Width: 0.5 ms
Lead Channel Setting Sensing Sensitivity: 0.5 mV
Pulse Gen Serial Number: 1300954

## 2022-02-01 ENCOUNTER — Telehealth: Payer: Self-pay

## 2022-02-01 NOTE — Progress Notes (Signed)
Remote ICD transmission.   

## 2022-02-01 NOTE — Telephone Encounter (Signed)
LVM for Patient to return call  RE: Schedule AWV with [facility] with Ronica Vivian CMA  Please transfer patient when if they return the call  

## 2022-02-11 ENCOUNTER — Telehealth: Payer: Self-pay | Admitting: Pulmonary Disease

## 2022-02-11 ENCOUNTER — Ambulatory Visit (INDEPENDENT_AMBULATORY_CARE_PROVIDER_SITE_OTHER): Payer: Medicare HMO | Admitting: *Deleted

## 2022-02-11 DIAGNOSIS — Z0000 Encounter for general adult medical examination without abnormal findings: Secondary | ICD-10-CM

## 2022-02-18 ENCOUNTER — Other Ambulatory Visit: Payer: Self-pay | Admitting: Internal Medicine

## 2022-02-18 DIAGNOSIS — J3089 Other allergic rhinitis: Secondary | ICD-10-CM

## 2022-02-21 NOTE — Telephone Encounter (Signed)
Requested Prescriptions  Pending Prescriptions Disp Refills  . fluticasone (FLONASE) 50 MCG/ACT nasal spray [Pharmacy Med Name: Fluticasone Propionate 50 MCG/ACT Nasal Suspension] 16 g 2    Sig: USE 1 SPRAY(S) IN EACH NOSTRIL ONCE DAILY AS NEEDED FOR ALLERGIES OR  RHINITIS     Ear, Nose, and Throat: Nasal Preparations - Corticosteroids Passed - 02/18/2022  6:10 PM      Passed - Valid encounter within last 12 months    Recent Outpatient Visits          6 months ago Controlled type 2 diabetes mellitus with hyperglycemia, without long-term current use of insulin [facility])   [place], [place], [place]   7 months ago COPD with acute exacerbation [facility])   [name], [name], [place]   10 months ago Controlled type 2 diabetes mellitus with other circulatory complication, without long-term current use of insulin ([place])   [place], Deborah B, MD   1 year ago Essential hypertension   [place], Stephen L, RPH-CPP   1 year ago Essential hypertension   [place], Jarome Matin, RPH-CPP      Future Appointments            In 3 weeks Troy Sine, MD [facility], [facility]   In 2 months Ladell Pier, MD [place]

## 2022-03-09 ENCOUNTER — Other Ambulatory Visit: Payer: Self-pay

## 2022-03-10 MED ORDER — TICAGRELOR 90 MG PO TABS: 90.0000 mg | ORAL_TABLET | Freq: Two times a day (BID) | ORAL | 0 refills | Status: DC

## 2022-03-14 NOTE — Progress Notes (Signed)
Subjective:   Stephen Jennings is a 66 y.o. male who presents for an Initial Medicare Annual Wellness Visit.  I discussed the limitations of evaluation and management by telemedicine and the availability of in person appointments. Patient expressed understanding and agreed to proceed.   Visit performed using audio  Patient:home Provider:home     Review of Systems    Defer to provider  Cardiac Risk Factors include: advanced age (>71mn, >>43women);male gender     Objective:    There were no vitals filed for this visit. There is no height or weight on file to calculate BMI.     03/14/2022   10:49 AM 08/23/2021    2:08 PM 02/02/2021   12:04 AM 03/31/2019   11:06 AM  Advanced Directives  Does Patient Have a Medical Advance Directive? No No No Unable to assess, patient is non-responsive or altered mental status  Would patient like information on creating a medical advance directive? No - Patient declined No - Patient declined No - Patient declined     Current Medications (verified) Outpatient Encounter Medications as of 02/11/2022  Medication Sig   Accu-Chek Softclix Lancets lancets Use to measure blood sugar twice a day   albuterol (PROVENTIL) (2.5 MG/3ML) 0.083% nebulizer solution Take 3 mLs (2.5 mg total) by nebulization every 6 (six) hours as needed for wheezing or shortness of breath.   albuterol (VENTOLIN HFA) 108 (90 Base) MCG/ACT inhaler Inhale 2 puffs into the lungs every 6 (six) hours as needed for wheezing or shortness of breath.   bisoprolol (ZEBETA) 5 MG tablet Take 1 tablet (5 mg total) by mouth daily.   Blood Glucose Monitoring Suppl (ACCU-CHEK GUIDE ME) w/Device KIT 1 kit by Does not apply route in the morning and at bedtime. Use to measure blood sugar twice a day   Budeson-Glycopyrrol-Formoterol (BREZTRI AEROSPHERE) 160-9-4.8 MCG/ACT AERO Inhale 2 puffs into the lungs 2 (two) times daily.   co-enzyme Q-10 30 MG capsule Take 1 capsule (30 mg total) by mouth daily.    empagliflozin (JARDIANCE) 10 MG TABS tablet Take 1 tablet (10 mg total) by mouth daily before breakfast.   eplerenone (INSPRA) 25 MG tablet Take 0.5 tablets (12.5 mg total) by mouth daily.   ezetimibe (ZETIA) 10 MG tablet Take 1 tablet (10 mg total) by mouth daily.   fluticasone (FLONASE) 50 MCG/ACT nasal spray USE 1 SPRAY(S) IN EACH NOSTRIL ONCE DAILY AS NEEDED FOR ALLERGIES OR  RHINITIS   furosemide (LASIX) 40 MG tablet Take 1 tablet (40 mg total) by mouth daily. Take 1 tablet 40 mg   glucose blood (ACCU-CHEK GUIDE) test strip Use to measure blood sugar twice a day   Hyprom-Naphaz-Polysorb-Zn Sulf (CLEAR EYES COMPLETE) SOLN Apply 1 drop to eye 3 (three) times daily.   loratadine (CLARITIN) 10 MG tablet Take 1 tablet (10 mg total) by mouth daily.   montelukast (SINGULAIR) 10 MG tablet Take 1 tablet (10 mg total) by mouth at bedtime.   nitroGLYCERIN (NITROSTAT) 0.4 MG SL tablet Place 1 tablet (0.4 mg total) under the tongue every 5 (five) minutes x 3 doses as needed for chest pain.   omeprazole (PRILOSEC) 20 MG capsule Take 1 capsule by mouth once daily   pravastatin (PRAVACHOL) 80 MG tablet Take 1 tablet (80 mg total) by mouth every evening.   sacubitril-valsartan (ENTRESTO) 97-103 MG Take 1 tablet by mouth 2 (two) times daily.   sodium zirconium cyclosilicate (LOKELMA) 5 g packet Take 10 g by mouth daily.  ticagrelor (BRILINTA) 90 MG TABS tablet Take 1 tablet (90 mg total) by mouth 2 (two) times daily.   traZODone (DESYREL) 50 MG tablet TAKE 1/2 TO 1 TABLET BY MOUTH AT BEDTIME   [DISCONTINUED] fluticasone (FLONASE) 50 MCG/ACT nasal spray USE 1 SPRAY(S) IN EACH NOSTRIL ONCE DAILY AS NEEDED FOR ALLERGIES OR RHINITIS   [DISCONTINUED] ticagrelor (BRILINTA) 90 MG TABS tablet Take 1 tablet (90 mg total) by mouth 2 (two) times daily.   No facility-administered encounter medications on file as of 02/11/2022.    Allergies (verified) Atorvastatin, Spironolactone, Metformin and related, and Other    History: Past Medical History:  Diagnosis Date   Cardiogenic shock ([place])    CHF (congestive heart failure) ([place])    Coronary artery disease    Diabetes mellitus without complication ([place])    Hyperlipidemia    Medical history non-contributory    Past Surgical History:  Procedure Laterality Date   CARDIAC CATHETERIZATION     CORONARY BALLOON ANGIOPLASTY N/A 02/04/2021   Procedure: CORONARY BALLOON ANGIOPLASTY;  Surgeon: Troy Sine, MD;  Location: [facility];  Service: Cardiovascular;  Laterality: N/A;   CORONARY THROMBECTOMY N/A 02/04/2021   Procedure: Coronary Thrombectomy;  Surgeon: Troy Sine, MD;  Location: [facility];  Service: Cardiovascular;  Laterality: N/A;   CORONARY/GRAFT ACUTE MI REVASCULARIZATION N/A 03/31/2019   Procedure: Coronary/Graft Acute MI Revascularization;  Surgeon: Troy Sine, MD;  Location: [facility];  Service: Cardiovascular;  Laterality: N/A;   ICD IMPLANT N/A 04/15/2019   Procedure: ICD IMPLANT;  Surgeon: Constance Haw, MD;  Location: [facility];  Service: Cardiovascular;  Laterality: N/A;   INTRAVASCULAR ULTRASOUND/IVUS N/A 02/04/2021   Procedure: Intravascular Ultrasound/IVUS;  Surgeon: Troy Sine, MD;  Location: [facility];  Service: Cardiovascular;  Laterality: N/A;   LEFT HEART CATH AND CORONARY ANGIOGRAPHY N/A 03/31/2019   Procedure: LEFT HEART CATH AND CORONARY ANGIOGRAPHY;  Surgeon: Troy Sine, MD;  Location: [facility];  Service: Cardiovascular;  Laterality: N/A;   LEFT HEART CATH AND CORONARY ANGIOGRAPHY N/A 02/02/2021   Procedure: LEFT HEART CATH AND CORONARY ANGIOGRAPHY;  Surgeon: Troy Sine, MD;  Location: [facility];  Service: Cardiovascular;  Laterality: N/A;   LEFT HEART CATH AND CORONARY ANGIOGRAPHY N/A 02/04/2021   Procedure: LEFT HEART CATH AND CORONARY ANGIOGRAPHY;  Surgeon: Troy Sine, MD;  Location: [facility];  Service: Cardiovascular;   Laterality: N/A;   NO PAST SURGERIES     RIGHT HEART CATH N/A 03/31/2019   Procedure: RIGHT HEART CATH;  Surgeon: Troy Sine, MD;  Location: [facility];  Service: Cardiovascular;  Laterality: N/A;   VENTRICULAR ASSIST DEVICE INSERTION N/A 03/31/2019   Procedure: VENTRICULAR ASSIST DEVICE INSERTION;  Surgeon: Troy Sine, MD;  Location: [facility];  Service: Cardiovascular;  Laterality: N/A;   Family History  Problem Relation Age of Onset   Hypertension Mother    Heart failure Mother    Headache Neg Hx    Migraines Neg Hx    Social History   Socioeconomic History   Marital status: Married    Spouse name: Not on file   Number of children: 1   Years of education: 8   Highest education level: 8th grade  Occupational History   Not on file  Tobacco Use   Smoking status: Former    Types: Cigarettes    Quit date: 04/08/2019  Years since quitting: 2.9    Passive exposure: Current (wife smokes)   Smokeless tobacco: Never  Vaping Use   Vaping Use: Never used  Substance and Sexual Activity   Alcohol use: No   Drug use: No   Sexual activity: Not Currently  Other Topics Concern   Not on file  Social History Narrative   Lives with wife   Right handed   Caffeine: about 2 cups of coffee every morning, no soda   Social Determinants of Health   Financial Resource Strain: Low Risk  (03/14/2022)   Overall Financial Resource Strain (CARDIA)    Difficulty of Paying Living Expenses: Not very hard  Food Insecurity: No Food Insecurity (03/14/2022)   Hunger Vital Sign    Worried About Running Out of Food in the Last Year: Never true    Ran Out of Food in the Last Year: Never true  Transportation Needs: No Transportation Needs (03/14/2022)   PRAPARE - Hydrologist (Medical): No    Lack of Transportation (Non-Medical): No  Physical Activity: Sufficiently Active (03/14/2022)   Exercise Vital Sign    Days of Exercise per Week: 7 days     Minutes of Exercise per Session: 60 min  Stress: No Stress Concern Present (03/14/2022)   [place]    Feeling of Stress : Not at all  Social Connections: Moderately Isolated (03/14/2022)   Social Connection and Isolation Panel [NHANES]    Frequency of Communication with Friends and Family: More than three times a week    Frequency of Social Gatherings with Friends and Family: More than three times a week    Attends Religious Services: Never    Marine scientist or Organizations: No    Attends Music therapist: Never    Marital Status: Married    Tobacco Counseling Counseling given: Not Answered   Clinical Intake:  Pre-visit preparation completed: Yes  Pain : No/denies pain     Nutritional Risks: None Diabetes: No  How often do you need to have someone help you when you read instructions, pamphlets, or other written materials from your doctor or pharmacy?: 1 - Never What is the last grade level you completed in school?: 12  Diabetic?yes  Interpreter Needed?: No  Information entered by :: Lacretia Nicks CMA   Activities of Daily Living    03/14/2022   10:49 AM  In your present state of health, do you have any difficulty performing the following activities:  Hearing? 0  Vision? 0  Difficulty concentrating or making decisions? 0  Walking or climbing stairs? 0  Dressing or bathing? 0  Doing errands, shopping? 0  Preparing Food and eating ? N  Using the Toilet? N  In the past six months, have you accidently leaked urine? N  Do you have problems with loss of bowel control? N  Managing your Medications? N  Managing your Finances? N  Housekeeping or managing your Housekeeping? N    Patient Care Team: Ladell Pier, MD as PCP - General (Internal Medicine) Troy Sine, MD as PCP - Cardiology (Cardiology)  Indicate any recent Medical Services you may have received from other  than [facility] providers in the past year (date may be approximate).     Assessment:   This is a routine wellness examination for M[place].  Hearing/Vision screen No results found.  Dietary issues and exercise activities discussed: Exercise limited by: None identified  Goals Addressed   None    Depression Screen    03/14/2022   10:47 AM 07/19/2021    3:49 PM 06/16/2021    3:04 PM 04/23/2021    9:28 AM 01/19/2021   10:08 AM 12/01/2020   11:41 AM 10/05/2020    8:44 AM  PHQ 2/9 Scores  PHQ - 2 Score 0 0 0 0 3 0 0  PHQ- 9 Score     6 11     Fall Risk    03/14/2022   10:49 AM 08/11/2021    3:18 PM 07/19/2021    3:49 PM 04/23/2021    9:22 AM 10/05/2020    8:44 AM  Fall Risk   Falls in the past year? 0 0 0 0 0  Number falls in past yr: 0  0 0 0  Injury with Fall? 0  0 0 0  Risk for fall due to : No Fall Risks   No Fall Risks   Follow up Falls evaluation completed  Falls evaluation completed      FALL RISK PREVENTION PERTAINING TO THE HOME:  Any stairs in or around the home? No  If so, are there any without handrails? No  Home free of loose throw rugs in walkways, pet beds, electrical cords, etc? Yes  Adequate lighting in your home to reduce risk of falls? Yes   ASSISTIVE DEVICES UTILIZED TO PREVENT FALLS:  Life alert? No  Use of a cane, walker or w/c? No  Grab bars in the bathroom? No  Shower chair or bench in shower? No  Elevated toilet seat or a handicapped toilet? No   TIMED UP AND GO:  Was the test performed? No .  Length of time to ambulate-NA      Cognitive Function:    03/14/2022   11:14 AM 03/14/2022   10:50 AM  MMSE - Mini Mental State Exam  Not completed: Unable to complete Unable to complete        03/14/2022   10:50 AM  6CIT Screen  What Year? 0 points  What month? 0 points  What time? 0 points  Count back from 20 0 points  Months in reverse 0 points  Repeat phrase 0 points  Total Score 0 points    Immunizations Immunization History   Administered Date(s) Administered   Influenza,inj,Quad PF,6+ Mos 05/08/2019, 06/08/2020, 04/23/2021   PFIZER(Purple Top)SARS-COV-2 Vaccination 11/04/2019, 11/25/2019   PNEUMOCOCCAL CONJUGATE-20 04/23/2021   Pneumococcal Polysaccharide-23 06/05/2019   Tetanus 05/08/2019    TDAP status: Up to date  Flu Vaccine status: Up to date  Pneumococcal vaccine status: Up to date  Covid-19 vaccine status: Information provided on how to obtain vaccines.   Qualifies for Shingles Vaccine? Yes   Zostavax completed No   Shingrix Completed?: No.    Education has been provided regarding the importance of this vaccine. Patient has been advised to call insurance company to determine out of pocket expense if they have not yet received this vaccine. Advised may also receive vaccine at local pharmacy or Health Dept. Verbalized acceptance and understanding.  Screening Tests Health Maintenance  Topic Date Due   FOOT EXAM  Never done   COLON CANCER SCREENING ANNUAL FOBT  Never done   COLONOSCOPY (Pts 45-29yr Insurance coverage will need to be confirmed)  Never done   Zoster Vaccines- Shingrix (1 of 2) Never done   COVID-19 Vaccine (3 - Pfizer series) 01/20/2020   HEMOGLOBIN A1C  02/09/2022   OPHTHALMOLOGY EXAM  02/10/2022  INFLUENZA VACCINE  03/22/2022   TETANUS/TDAP  05/07/2029   Pneumonia Vaccine 34+ Years old  Completed   Hepatitis C Screening  Completed   HIV Screening  Completed   HPV VACCINES  Aged Out    Health Maintenance  Health Maintenance Due  Topic Date Due   FOOT EXAM  Never done   COLON CANCER SCREENING ANNUAL FOBT  Never done   COLONOSCOPY (Pts 45-43yr Insurance coverage will need to be confirmed)  Never done   Zoster Vaccines- Shingrix (1 of 2) Never done   COVID-19 Vaccine (3 - Pfizer series) 01/20/2020   HEMOGLOBIN A1C  02/09/2022   OPHTHALMOLOGY EXAM  02/10/2022    Patient declined colonoscopy   Lung Cancer Screening: (Low Dose CT Chest recommended if Age 66-80years,  30 pack-year currently smoking OR have quit w/in 15years.) does not qualify.   Lung Cancer Screening Referral: na  Additional Screening:  Hepatitis C Screening: does not qualify; Completed 05/09/2019  Vision Screening: Recommended annual ophthalmology exams for early detection of glaucoma and other disorders of the eye. Is the patient up to date with their annual eye exam?  Yes  Who is the provider or what is the name of the office in which the patient attends annual eye exams? [facility] If pt is not established with a provider, would they like to be referred to a provider to establish care? No .   Dental Screening: Recommended annual dental exams for proper oral hygiene  Community Resource Referral / Chronic Care Management: CRR required this visit?  No   CCM required this visit?  No      Plan:     I have personally reviewed and noted the following in the patient's chart:   Medical and social history Use of alcohol, tobacco or illicit drugs  Current medications and supplements including opioid prescriptions. Patient is not currently taking opioid prescriptions. Functional ability and status Nutritional status Physical activity Advanced directives List of other physicians Hospitalizations, surgeries, and ER visits in previous 12 months Vitals Screenings to include cognitive, depression, and falls Referrals and appointments  In addition, I have reviewed and discussed with patient certain preventive protocols, quality metrics, and best practice recommendations. A written personalized care plan for preventive services as well as general preventive health recommendations were provided to patient.     ALacretia Nicks [place]  03/14/2022   Nurse Notes:  Mr. BUmland, Thank you for taking time to come for your Medicare Wellness Visit. I appreciate your ongoing commitment to your health goals. Please review the following plan we discussed and let me know if I can assist  you in the future.   These are the goals we discussed:  Goals   None     This is a list of the screening recommended for you and due dates:  Health Maintenance  Topic Date Due   Complete foot exam   Never done   Stool Blood Test  Never done   Colon Cancer Screening  Never done   Zoster (Shingles) Vaccine (1 of 2) Never done   COVID-19 Vaccine (3 - Pfizer series) 01/20/2020   Hemoglobin A1C  02/09/2022   Eye exam for diabetics  02/10/2022   Flu Shot  03/22/2022   Tetanus Vaccine  05/07/2029   Pneumonia Vaccine  Completed   Hepatitis C Screening: USPSTF Recommendation to screen - Ages 18-79 yo.  Completed   HIV Screening  Completed   HPV Vaccine  Aged Out

## 2022-03-16 ENCOUNTER — Ambulatory Visit (INDEPENDENT_AMBULATORY_CARE_PROVIDER_SITE_OTHER): Payer: Medicare HMO | Admitting: Cardiovascular Disease

## 2022-03-16 ENCOUNTER — Encounter: Payer: Self-pay | Admitting: Cardiovascular Disease

## 2022-03-16 DIAGNOSIS — Z95810 Presence of automatic (implantable) cardiac defibrillator: Secondary | ICD-10-CM

## 2022-03-16 DIAGNOSIS — I255 Ischemic cardiomyopathy: Secondary | ICD-10-CM

## 2022-03-16 DIAGNOSIS — I5022 Chronic systolic (congestive) heart failure: Secondary | ICD-10-CM | POA: Diagnosis not present

## 2022-03-16 DIAGNOSIS — E785 Hyperlipidemia, unspecified: Secondary | ICD-10-CM | POA: Diagnosis not present

## 2022-03-16 DIAGNOSIS — I2510 Atherosclerotic heart disease of native coronary artery without angina pectoris: Secondary | ICD-10-CM

## 2022-03-16 DIAGNOSIS — I10 Essential (primary) hypertension: Secondary | ICD-10-CM

## 2022-03-16 DIAGNOSIS — I2102 ST elevation (STEMI) myocardial infarction involving left anterior descending coronary artery: Secondary | ICD-10-CM | POA: Diagnosis not present

## 2022-03-16 MED ORDER — SACUBITRIL-VALSARTAN 49-51 MG PO TABS: 1.0000 | ORAL_TABLET | Freq: Two times a day (BID) | ORAL | 2 refills | Status: DC

## 2022-03-16 NOTE — Progress Notes (Signed)
Cardiology Office Note    Date:  03/19/2022   ID:  Stephen Jennings, DOB [birth year], MRN [id number]  PCP:  Ladell Pier, MD  Cardiologist:  Shelva Majestic, MD   8 month follow-up cardiology evaluation  History of Present Illness:  Stephen Jennings is a 66 y.o. male who has a history of PAD and suffered an acute coronary syndrome in August 2020 with acute respiratory failure and cardiogenic shock in the setting of an anterior ST segment elevation myocardial infarction.  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  The RCA had 70% proximal stenosis.  The LAD was occluded proximally with TIMI 0 flow and he underwent successful intervention to the LAD PTCA and stenting of the proximal LAD, PTCA of the ostium of the diagonal vessel, and PTCA of the mid LAD with insertion of a 2.75 x 26 mm Resolute stent to the proximal LAD before the diagonal bifurcation.  Due to cardiogenic shock an Impella catheter was inserted for hemodynamic support.  Subsequently, he developed torsade and underwent ICD implantation.  An echo Doppler study in April 2022 showed normalization of LV function with EF at 55 to 60%.  Patient had done well and apparently stopped taking Brilinta in April 2022, 6 weeks prior to his most recent ACS which occurred on February 02, 2021.  Catheterization showed EF estimated 25 to 30% with significant thrombus burden in the LAD immediately after the stent extending into the diagonal vessel.  He was treated with Aggrastat and bolused with Brilinta and 2 days later return for repeat catheterization by me on February 04, 2021.  Relook angiography of the left coronary system showed improvement in the previous thrombus burden in the diagonal and LAD distal stent and beyond but moderate residual thrombus was still present for which he underwent successful percutaneous coronary intervention utilizing Pronto  thrombectomy, intravascular ultrasound, and ultimate PTCA of the distal stented segment and mid LAD.  Was recommended that he continue DAPT indefinitely.  Subsequently, the patient has been followed by [facility]ine [facility]  He was last seen by Dr. Haroldine Laws on March 23, 2021 at which time he admitted that over the past week he was experiencing more shortness of breath with activity or lying down.  Prior to that evaluation he had been seen in the Pharm.D. clinic and Lasix was stopped due to mild HKA and his beta-blocker increased and losartan was changed to [facility] 2 24/26 twice daily.  When seen by Dr. Haroldine Laws recently, it was recommended that Entresto be increased to 49/51 mg twice daily and that he take Lasix 40 mg for 2 days then 40 mg on Monday Wednesday and Friday.  He also has been on Jardiance 10 mg daily and had been on metoprolol succinate with plans to possibly switch to bisoprolol if wheezing develops.  His ICD was interrogated and there was no VT or AF noted.  I saw him for my initial cardiology office evaluation in March 24, 2021 following his June 2022 hospitalization.  He has not had any recurrent chest pain.  Breathing has improved with the recent resumption of Lasix.  He continues to be on aspirin and Brilinta 90 mg twice daily for DAPT.  At that time, he was only on rosuvastatin at 20 mg with Zetia and with his LDL cholesterol at 150 with total cholesterol 221 in June 2022 I suggested he increase rosuvastatin to 40 mg and  continue Zetia 10 mg with target LDL less than 55 if at all possible.  Since I saw him he has felt well.  He denies any recurrent chest pain.  He was reevaluated in his heart clinic failure clinic by Allena Katz, [facility] on May 25, 2021 and saw Dr. Haroldine Laws on July 27, 2021.  He was felt to have stable NYHA II symptomatology.  I last saw him on August 04, 2021 at which time he was on Entresto 97/103 mg twice a day, Lasix 40 mg daily, recently  increased from Monday Wednesday and Friday, bisoprolol 5 mg, and Jardiance 10 mg daily.  He was no longer on spironolactone due to hyperkalemia.  He has not had any VT or AF on recent ICD interrogation.  He denied any recurrent anginal symptomatology.  He underwent a follow-up echo Doppler study on July 27, 2021 which showed EF at 35 to 40% with moderate dilation of his left ventricle and grade 2 diastolic dysfunction.  There was mild biatrial enlargement, mild MR.    Since I last saw him, he apparently ran out of Entresto approximately 5 to 6 weeks ago.  There is some question that he may have been back on losartan and also ran out of this as well.  He continues to be on bisoprolol 5 mg, Jardiance 10 mg, eplerenone 12.5 mg, Zetia 10 mg, and is on day history and as needed albuterol for his lungs.  He continues to be on aspirin/Brilinta 90 mg twice a day and is on pravastatin 80 mg for hyperlipidemia.  He denies any chest pain.  He quit smoking in August 2020 after smoking for 40 years.  He presents for reevaluation.  Past Medical History:  Diagnosis Date   Cardiogenic shock ([place])    CHF (congestive heart failure) ([place])    Coronary artery disease    Diabetes mellitus without complication ([place])    Hyperlipidemia    Medical history non-contributory     Past Surgical History:  Procedure Laterality Date   CARDIAC CATHETERIZATION     CORONARY BALLOON ANGIOPLASTY N/A 02/04/2021   Procedure: CORONARY BALLOON ANGIOPLASTY;  Surgeon: Troy Sine, MD;  Location: [place] [facility];  Service: Cardiovascular;  Laterality: N/A;   CORONARY THROMBECTOMY N/A 02/04/2021   Procedure: Coronary Thrombectomy;  Surgeon: Troy Sine, MD;  Location: [place] [facility];  Service: Cardiovascular;  Laterality: N/A;   CORONARY/GRAFT ACUTE MI REVASCULARIZATION N/A 03/31/2019   Procedure: Coronary/Graft Acute MI Revascularization;  Surgeon: Troy Sine, MD;  Location: [place] [facility];  Service: Cardiovascular;   Laterality: N/A;   ICD IMPLANT N/A 04/15/2019   Procedure: ICD IMPLANT;  Surgeon: Constance Haw, MD;  Location: [place] [facility];  Service: Cardiovascular;  Laterality: N/A;   INTRAVASCULAR ULTRASOUND/IVUS N/A 02/04/2021   Procedure: Intravascular Ultrasound/IVUS;  Surgeon: Troy Sine, MD;  Location: [place] [facility];  Service: Cardiovascular;  Laterality: N/A;   LEFT HEART CATH AND CORONARY ANGIOGRAPHY N/A 03/31/2019   Procedure: LEFT HEART CATH AND CORONARY ANGIOGRAPHY;  Surgeon: Troy Sine, MD;  Location: [place] [facility];  Service: Cardiovascular;  Laterality: N/A;   LEFT HEART CATH AND CORONARY ANGIOGRAPHY N/A 02/02/2021   Procedure: LEFT HEART CATH AND CORONARY ANGIOGRAPHY;  Surgeon: Troy Sine, MD;  Location: [place] [facility];  Service: Cardiovascular;  Laterality: N/A;   LEFT HEART CATH AND CORONARY ANGIOGRAPHY N/A 02/04/2021   Procedure: LEFT HEART CATH AND CORONARY ANGIOGRAPHY;  Surgeon: Shelva Majestic  A, MD;  Location: [place] [facility];  Service: Cardiovascular;  Laterality: N/A;   NO PAST SURGERIES     RIGHT HEART CATH N/A 03/31/2019   Procedure: RIGHT HEART CATH;  Surgeon: Troy Sine, MD;  Location: [place] [facility];  Service: Cardiovascular;  Laterality: N/A;   VENTRICULAR ASSIST DEVICE INSERTION N/A 03/31/2019   Procedure: VENTRICULAR ASSIST DEVICE INSERTION;  Surgeon: Troy Sine, MD;  Location: [place] [facility];  Service: Cardiovascular;  Laterality: N/A;    Current Medications: Outpatient Medications Prior to Visit  Medication Sig Dispense Refill   Accu-Chek Softclix Lancets lancets Use to measure blood sugar twice a day 100 each 6   albuterol (PROVENTIL) (2.5 MG/3ML) 0.083% nebulizer solution Take 3 mLs (2.5 mg total) by nebulization every 6 (six) hours as needed for wheezing or shortness of breath. 75 mL 12   albuterol (VENTOLIN HFA) 108 (90 Base) MCG/ACT inhaler Inhale 2 puffs into the lungs every 6 (six) hours as needed for wheezing or  shortness of breath. 18 g 3   bisoprolol (ZEBETA) 5 MG tablet Take 1 tablet (5 mg total) by mouth daily. 30 tablet 4   Blood Glucose Monitoring Suppl (ACCU-CHEK GUIDE ME) w/Device KIT 1 kit by Does not apply route in the morning and at bedtime. Use to measure blood sugar twice a day 1 kit 0   Budeson-Glycopyrrol-Formoterol (BREZTRI AEROSPHERE) 160-9-4.8 MCG/ACT AERO Inhale 2 puffs into the lungs 2 (two) times daily. 10.7 g 11   co-enzyme Q-10 30 MG capsule Take 1 capsule (30 mg total) by mouth daily. 90 capsule 3   empagliflozin (JARDIANCE) 10 MG TABS tablet Take 1 tablet (10 mg total) by mouth daily before breakfast. 90 tablet 3   eplerenone (INSPRA) 25 MG tablet Take 0.5 tablets (12.5 mg total) by mouth daily. 30 tablet 4   ezetimibe (ZETIA) 10 MG tablet Take 1 tablet (10 mg total) by mouth daily. 90 tablet 3   fluticasone (FLONASE) 50 MCG/ACT nasal spray USE 1 SPRAY(S) IN EACH NOSTRIL ONCE DAILY AS NEEDED FOR ALLERGIES OR  RHINITIS 16 g 2   furosemide (LASIX) 40 MG tablet Take 1 tablet (40 mg total) by mouth daily. Take 1 tablet 40 mg 30 tablet 6   glucose blood (ACCU-CHEK GUIDE) test strip Use to measure blood sugar twice a day 100 each 6   Hyprom-Naphaz-Polysorb-Zn Sulf (CLEAR EYES COMPLETE) SOLN Apply 1 drop to eye 3 (three) times daily. 15 mL 1   loratadine (CLARITIN) 10 MG tablet Take 1 tablet (10 mg total) by mouth daily. 30 tablet 11   montelukast (SINGULAIR) 10 MG tablet Take 1 tablet (10 mg total) by mouth at bedtime. 30 tablet 11   nitroGLYCERIN (NITROSTAT) 0.4 MG SL tablet Place 1 tablet (0.4 mg total) under the tongue every 5 (five) minutes x 3 doses as needed for chest pain. 25 tablet 3   omeprazole (PRILOSEC) 20 MG capsule Take 1 capsule by mouth once daily 30 capsule 11   pravastatin (PRAVACHOL) 80 MG tablet Take 1 tablet (80 mg total) by mouth every evening. 30 tablet 11   sodium zirconium cyclosilicate (LOKELMA) 5 g packet Take 10 g by mouth daily. 30 each 11   ticagrelor  (BRILINTA) 90 MG TABS tablet Take 1 tablet (90 mg total) by mouth 2 (two) times daily. 180 tablet 0   traZODone (DESYREL) 50 MG tablet TAKE 1/2 TO 1 TABLET BY MOUTH AT BEDTIME 90 tablet 1   sacubitril-valsartan (ENTRESTO) 97-103 MG Take  1 tablet by mouth 2 (two) times daily. 60 tablet 4   No facility-administered medications prior to visit.     Allergies:   Atorvastatin, Spironolactone, Metformin and related, and Other   Social History   Socioeconomic History   Marital status: Married    Spouse name: Not on file   Number of children: 1   Years of education: 8   Highest education level: 8th grade  Occupational History   Not on file  Tobacco Use   Smoking status: Former    Types: Cigarettes    Quit date: 04/08/2019    Years since quitting: 2.9    Passive exposure: Current (wife smokes)   Smokeless tobacco: Never  Vaping Use   Vaping Use: Never used  Substance and Sexual Activity   Alcohol use: No   Drug use: No   Sexual activity: Not Currently  Other Topics Concern   Not on file  Social History Narrative   Lives with wife   Right handed   Caffeine: about 2 cups of coffee every morning, no soda   Social Determinants of Health   Financial Resource Strain: Low Risk  (03/14/2022)   Overall Financial Resource Strain (CARDIA)    Difficulty of Paying Living Expenses: Not very hard  Food Insecurity: No Food Insecurity (03/14/2022)   Hunger Vital Sign    Worried About Running Out of Food in the Last Year: Never true    Ran Out of Food in the Last Year: Never true  Transportation Needs: No Transportation Needs (03/14/2022)   PRAPARE - Hydrologist (Medical): No    Lack of Transportation (Non-Medical): No  Physical Activity: Sufficiently Active (03/14/2022)   Exercise Vital Sign    Days of Exercise per Week: 7 days    Minutes of Exercise per Session: 60 min  Stress: No Stress Concern Present (03/14/2022)   [place]    Feeling of Stress : Not at all  Social Connections: Moderately Isolated (03/14/2022)   Social Connection and Isolation Panel [NHANES]    Frequency of Communication with Friends and Family: More than three times a week    Frequency of Social Gatherings with Friends and Family: More than three times a week    Attends Religious Services: Never    Marine scientist or Organizations: No    Attends Music therapist: Never    Marital Status: Married     Family History:  The patient's family history includes Heart failure in his mother; Hypertension in his mother.   ROS General: Negative; No fevers, chills, or night sweats;  HEENT: Negative; No changes in vision or hearing, sinus congestion, difficulty swallowing Pulmonary: Negative; No cough, wheezing, shortness of breath, hemoptysis Cardiovascular: The HPI GI: Negative; No nausea, vomiting, diarrhea, or abdominal pain GU: Negative; No dysuria, hematuria, or difficulty voiding Musculoskeletal: Negative; no myalgias, joint pain, or weakness Hematologic/Oncology: Negative; no easy bruising, bleeding Endocrine: Negative; no heat/cold intolerance; no diabetes Neuro: Negative; no changes in balance, headaches Skin: Negative; No rashes or skin lesions Psychiatric: Negative; No behavioral problems, depression Sleep: Negative; No snoring, daytime sleepiness, hypersomnolence, bruxism, restless legs, hypnogognic hallucinations, no cataplexy Other comprehensive 14 point system review is negative.   PHYSICAL EXAM:   VS:  BP 140/88 (BP Location: Left Arm, Patient Position: Sitting, Cuff Size: Normal)   Pulse 73   Ht 5' 8"  (1.727 m)   Wt 207 lb (93.9 kg)  BMI 31.47 kg/m     Repeat blood pressure by me was 134/86  Wt Readings from Last 3 Encounters:  03/16/22 207 lb (93.9 kg)  01/03/22 206 lb 9.6 oz (93.7 kg)  10/04/21 204 lb 3.2 oz (92.6 kg)     General: Alert, oriented, no distress.   Skin: normal turgor, no rashes, warm and dry HEENT: Normocephalic, atraumatic. Pupils equal round and reactive to light; sclera anicteric; extraocular muscles intact;  Nose without nasal septal hypertrophy Mouth/Parynx benign; Mallinpatti scale 3 Neck: No JVD, no carotid bruits; normal carotid upstroke Lungs: clear to ausculatation and percussion; no wheezing or rales Chest wall: without tenderness to palpitation Heart: PMI not displaced, RRR, s1 s2 normal, 1/6 systolic murmur, no diastolic murmur, no rubs, gallops, thrills, or heaves Abdomen: soft, nontender; no hepatosplenomehaly, BS+; abdominal aorta nontender and not dilated by palpation. Back: no CVA tenderness Pulses 2+ Musculoskeletal: full range of motion, normal strength, no joint deformities Extremities: no clubbing cyanosis or edema, Homan's sign negative  Neurologic: grossly nonfocal; Cranial nerves grossly wnl Psychologic: Normal mood and affect   Studies/Labs Reviewed:   March 16, 2022 ECG (independently read by me): NSR at 73, LAE, QS V1-2  August 04, 2021 ECG (independently read by me):  NSR at 67, QS V1-2, PRWP V1-5; T wave abnormality anteriorly  March 24, 2021 ECG (independently read by me):  NSR at 81; Biatrial enlargement, QS V1-2, normal intervals  Recent Labs:    Latest Ref Rng & Units 07/27/2021   10:40 AM 05/25/2021    9:30 AM 05/07/2021    8:50 AM  BMP  Glucose 70 - 99 mg/dL 96  122  127   BUN 8 - 23 mg/dL 15  15  21    Creatinine 0.61 - 1.24 mg/dL 0.91  1.19  1.32   Sodium 135 - 145 mmol/L 138  141  138   Potassium 3.5 - 5.1 mmol/L 3.6  3.8  5.0   Chloride 98 - 111 mmol/L 105  107  108   CO2 22 - 32 mmol/L 25  28  24    Calcium 8.9 - 10.3 mg/dL 9.0  9.5  9.4         Latest Ref Rng & Units 07/27/2021   10:40 AM 05/25/2021    9:30 AM 03/24/2021   12:20 PM  Hepatic Function  Total Protein 6.5 - 8.1 g/dL 6.0  6.7  7.8   Albumin 3.5 - 5.0 g/dL 3.6  4.0  5.3   AST 15 - 41 U/L 35  33  32   ALT 0 - 44  U/L 75  29  40   Alk Phosphatase 38 - 126 U/L 67  76  109   Total Bilirubin 0.3 - 1.2 mg/dL 0.5  0.6  0.9   Bilirubin, Direct 0.00 - 0.40 mg/dL   0.29        Latest Ref Rng & Units 10/04/2021    4:17 PM 04/23/2021   10:25 AM 02/06/2021    3:11 AM  CBC  WBC 4.0 - 10.5 K/uL 8.1  7.2  10.7   Hemoglobin 13.0 - 17.0 g/dL 13.6  13.1  15.4   Hematocrit 39.0 - 52.0 % 41.0  39.9  47.0   Platelets 150.0 - 400.0 K/uL 225.0  258  329    Lab Results  Component Value Date   MCV 88.4 10/04/2021   MCV 90 04/23/2021   MCV 89.2 02/06/2021   Lab Results  Component Value Date  TSH 0.784 02/02/2021   Lab Results  Component Value Date   HGBA1C 6.7 08/11/2021     BNP    Component Value Date/Time   BNP 415.9 (H) 07/27/2021 1040    ProBNP    Component Value Date/Time   PROBNP 189.0 (H) 10/04/2021 1617     Lipid Panel     Component Value Date/Time   CHOL 98 07/27/2021 1040   CHOL 117 11/16/2020 0912   TRIG 30 07/27/2021 1040   HDL 56 07/27/2021 1040   HDL 37 (L) 11/16/2020 0912   CHOLHDL 1.8 07/27/2021 1040   VLDL 6 07/27/2021 1040   LDLCALC 36 07/27/2021 1040   LDLCALC 66 11/16/2020 0912   LABVLDL 14 11/16/2020 0912     RADIOLOGY: No results found.   Additional studies/ records that were reviewed today include:   Emergent cath :February 02, 2021 Prox RCA lesion is 20% stenosed. Prox LAD to Mid LAD lesion is 15% stenosed. 1st Diag lesion is 5% stenosed. Ost LAD to Prox LAD lesion is 20% stenosed. Dist LAD-1 lesion is 40% stenosed. Dist LAD-2 lesion is 80% stenosed. There is severe left ventricular systolic dysfunction. LV end diastolic pressure is severely elevated. The left ventricular ejection fraction is 25-35% by visual estimate.   Patent proximal LAD stent with mild 20% in-stent narrowing.  There is significant thrombus burden immediately after the stent extending into the diagonal vessel and mid LAD with TIMI-3 flow.  There is a 305 mid distal LAD stenosis and   distal 80% stenosis in a small caliber distal LAD.   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  The dominant RCA had improvement in the previous 70% stenosis which now appeared approximately 20%.  The RCA supplied the PDA and PLA vessel   Acute LV dysfunction with EF estimated at 25 to 30% with severe hypocontractility involving the mid distal anterolateral wall apex and apical inferior segment.  Elevated LVEDP at 29 mmHg.   RECOMMENDATION: Patient was started on Aggrastat in the catheterization laboratory and was bolused with Brilinta 180 mg orally.  He does not appear to have significant stenoses at the site of his thrombotic diagonal and LAD and plan continuation of Aggrastat for approximately 48 hours with restudy.  Of note, the patient had recently stopped his ticagrelor and a echo on December 14, 2020 had shown normalization of LV function without wall motion abnormalities.      02/04/2021: Repeat cath/PCI Ost LAD to Prox LAD lesion is 20% stenosed. Prox LAD to Mid LAD lesion is 40% stenosed. Dist LAD-1 lesion is 40% stenosed. Dist LAD-2 lesion is 80% stenosed. Prox RCA lesion is 20% stenosed. Post intervention, there is a 0% residual stenosis. 1st Diag lesion is 5% stenosed.   Relook angiography of the left coronary system showed improvement in the previous thrombus burden in the diagonal and LAD distal stent and beyond but moderate residual thrombus was still present.   LVEDP 8 mmHg   Successful percutaneous coronary intervention of the LAD utilizing Pronto thrombectomy, intravascular ultrasound and ultimate PTCA of the distal stented segment and mid LAD with residual narrowing 0%.  Resolution of thrombus in the diagonal vessel with mild 5% narrowing.  Very mild thrombus distal stent in the region of the septal perforating artery.  There is brisk TIMI-3 flow.   RECOMMENDATION: DAPT indefinitely.   Would continue Aggrastat for additional 24 to 36 hours.  GDMT Medical therapy for his LV  function/ischemic cardiomyopathy.      Intervention     ECHO: July 27, 2021 IMPRESSIONS   1. Left ventricular ejection fraction, by estimation, is 35 to 40%. The  left ventricle has moderately decreased function. The left ventricle  demonstrates regional wall motion abnormalities (see scoring  diagram/findings for description). The left  ventricular internal cavity size was moderately dilated. Left ventricular  diastolic parameters are consistent with Grade II diastolic dysfunction  (pseudonormalization). There is akinesis of the left ventricular,  anteroseptal wall.   2. Right ventricular systolic function is normal. The right ventricular  size is normal.   3. Left atrial size was mildly dilated.   4. Right atrial size was mildly dilated.   5. The mitral valve is normal in structure. Mild mitral valve  regurgitation. No evidence of mitral stenosis.   6. The aortic valve is tricuspid. Aortic valve regurgitation is not  visualized. No aortic stenosis is present.   7. The inferior vena cava is normal in size with greater than 50%  respiratory variability, suggesting right atrial pressure of 3 mmHg.    ASSESSMENT:    1. Chronic systolic heart failure ([place])   2. Ischemic cardiomyopathy   3. STEMI involving left anterior descending coronary artery [facility]): August 2020; June 2022   4. CAD in native artery   5. Essential hypertension   6. Cardiac defibrillator in situ   7. Hyperlipidemia with target LDL less than 70     PLAN:  Stephen Jennings is a 84 year old gentleman who has a history of COPD, type 2 diabetes mellitus, CAD, and had suffered an initial myocardial infarction complicated by respiratory failure and cardiogenic shock in June 2020 at which time he underwent successful PCI to totally occluded LAD.  He required Impella support and initial echo showed EF at 20 to 25%.  His  hospital course was complicated by torsade for which she underwent placement of an ICD prior to discharge.  His LV function subsequently normalized by April 2022 at which time his Kary Kos was discontinued.  Unfortunately 2 months later he presented with ACS secondary to extensive thrombus burden in the LAD for which he was treated with Aggrastat and on relook 2 days later he underwent Pronto thrombectomy intravascular ultrasound and ultimate PTCA of the distal stented segment and mid LAD with residual narrowing of 0%.  Subsequently, he has been without anginal symptomatology.  At his office visit in December 2022 he was on optimal guideline directed medical therapy with Entresto 97/103 twice a day, Lasix 40 mg, bisoprolol 5 mg, and Jardiance.  He was no longer on spironolactone due to hyperkalemia.  He has not had any further episodes of VT or AF on ICD interrogation.  He has continued to be on DAPT with aspirin/Brilinta.  Apparently, since he has had issues with some of his medications.  It appears that he had run out of Entresto.  When I reviewed a note of Dr. Silas Flood it appeared that he also at some point may have been back on losartan instead of Entresto.  However presently he is not on either of those medications.  I have provided him with samples of Entresto 49/51 mg and recommended he take this twice a day.  He is on pravastatin 80 mg for hyperlipidemia.  LDL cholesterol in December 2022 was excellent at 36.  I will recheck laboratory with a comprehensive metabolic panel, CBC, lipid studies, TSH, and will also check an LP(a).  He sees Dr. Karle Plumber for primary  care and recently had a wellness visit.  I will see him in 6 months for reevaluation.   Medication Adjustments/Labs and Tests Ordered: Current medicines are reviewed at length with the patient today.  Concerns regarding medicines are outlined above.  Medication changes, Labs and Tests ordered today are listed in the Patient Instructions  below. Patient Instructions  Medication Instructions:  Use samples for 49/51 Entresto - lets start back with this dose for now.  *If you need a refill on your cardiac medications before your next appointment, please call your pharmacy*   Lab Work: CMET, CBC, TSH, LIPID, LPa  come back fasting (nothing to eat or drink)   If you have labs (blood work) drawn today and your tests are completely normal, you will receive your results only by: [place] (if you have MyChart) OR A paper copy in the mail If you have any lab test that is abnormal or we need to change your treatment, we will call you to review the results.  Follow-Up: At [facility], you and your health needs are our priority.  As part of our continuing mission to provide you with exceptional heart care, we have created designated Provider Care Teams.  These Care Teams include your primary Cardiologist (physician) and Advanced Practice Providers (APPs -  Physician Assistants and Nurse Practitioners) who all work together to provide you with the care you need, when you need it.  We recommend signing up for the patient portal called "MyChart".  Sign up information is provided on this After Visit Summary.  MyChart is used to connect with patients for Virtual Visits (Telemedicine).  Patients are able to view lab/test results, encounter notes, upcoming appointments, etc.  Non-urgent messages can be sent to your provider as well.   To learn more about what you can do with MyChart, go to [email or web address].    Your next appointment:   6 month(s)  The format for your next appointment:   In Person  Provider:   Shelva Majestic, MD           Signed, Shelva Majestic, MD  03/19/2022 [place] [address], [place], [place], [place]  [address] Phone: [phone]

## 2022-03-16 NOTE — Patient Instructions (Signed)
Medication Instructions:  Use samples for 49/51 Entresto - lets start back with this dose for now.  *If you need a refill on your cardiac medications before your next appointment, please call your pharmacy*   Lab Work: CMET, CBC, TSH, LIPID, LPa  come back fasting (nothing to eat or drink)   If you have labs (blood work) drawn today and your tests are completely normal, you will receive your results only by: [place] (if you have MyChart) OR A paper copy in the mail If you have any lab test that is abnormal or we need to change your treatment, we will call you to review the results.  Follow-Up: At [facility], you and your health needs are our priority.  As part of our continuing mission to provide you with exceptional heart care, we have created designated Provider Care Teams.  These Care Teams include your primary Cardiologist (physician) and Advanced Practice Providers (APPs -  Physician Assistants and Nurse Practitioners) who all work together to provide you with the care you need, when you need it.  We recommend signing up for the patient portal called "MyChart".  Sign up information is provided on this After Visit Summary.  MyChart is used to connect with patients for Virtual Visits (Telemedicine).  Patients are able to view lab/test results, encounter notes, upcoming appointments, etc.  Non-urgent messages can be sent to your provider as well.   To learn more about what you can do with MyChart, go to [email or web address].    Your next appointment:   6 month(s)  The format for your next appointment:   In Person  Provider:   Shelva Majestic, MD

## 2022-03-19 ENCOUNTER — Encounter: Payer: Self-pay | Admitting: Cardiovascular Disease

## 2022-04-20 ENCOUNTER — Ambulatory Visit (INDEPENDENT_AMBULATORY_CARE_PROVIDER_SITE_OTHER): Payer: Medicare HMO

## 2022-04-20 DIAGNOSIS — I255 Ischemic cardiomyopathy: Secondary | ICD-10-CM

## 2022-04-20 LAB — CUP PACEART REMOTE DEVICE CHECK
Battery Remaining Longevity: 71 mo
Battery Remaining Percentage: 71 %
Battery Voltage: 2.98 V
Brady Statistic AP VP Percent: 1 %
Brady Statistic AP VS Percent: 1.9 %
Brady Statistic AS VP Percent: 1 %
Brady Statistic AS VS Percent: 98 %
Brady Statistic RA Percent Paced: 1.7 %
Brady Statistic RV Percent Paced: 1 %
Date Time Interrogation Session: 20230830040015
HighPow Impedance: 84 Ohm
HighPow Impedance: 84 Ohm
Implantable Lead Implant Date: 20200824
Implantable Lead Implant Date: 20200824
Implantable Lead Location: 753859
Implantable Lead Location: 753860
Implantable Pulse Generator Implant Date: 20200824
Lead Channel Impedance Value: 330 Ohm
Lead Channel Impedance Value: 360 Ohm
Lead Channel Pacing Threshold Amplitude: 0.75 V
Lead Channel Pacing Threshold Amplitude: 0.75 V
Lead Channel Pacing Threshold Pulse Width: 0.5 ms
Lead Channel Pacing Threshold Pulse Width: 0.5 ms
Lead Channel Sensing Intrinsic Amplitude: 1.2 mV
Lead Channel Sensing Intrinsic Amplitude: 11.9 mV
Lead Channel Setting Pacing Amplitude: 2 V
Lead Channel Setting Pacing Amplitude: 2.5 V
Lead Channel Setting Pacing Pulse Width: 0.5 ms
Lead Channel Setting Sensing Sensitivity: 0.5 mV
Pulse Gen Serial Number: 1300954

## 2022-04-24 ENCOUNTER — Other Ambulatory Visit: Payer: Self-pay | Admitting: Internal Medicine

## 2022-04-24 DIAGNOSIS — G4709 Other insomnia: Secondary | ICD-10-CM

## 2022-04-29 ENCOUNTER — Ambulatory Visit: Payer: Medicare HMO | Attending: Internal Medicine | Admitting: Internal Medicine

## 2022-04-29 ENCOUNTER — Encounter: Payer: Self-pay | Admitting: Internal Medicine

## 2022-04-29 VITALS — BP 129/79 | HR 72 | Temp 98.6°F | Ht 68.0 in | Wt 201.6 lb

## 2022-04-29 DIAGNOSIS — I10 Essential (primary) hypertension: Secondary | ICD-10-CM

## 2022-04-29 DIAGNOSIS — Z2821 Immunization not carried out because of patient refusal: Secondary | ICD-10-CM

## 2022-04-29 DIAGNOSIS — E669 Obesity, unspecified: Secondary | ICD-10-CM | POA: Diagnosis not present

## 2022-04-29 DIAGNOSIS — I5022 Chronic systolic (congestive) heart failure: Secondary | ICD-10-CM

## 2022-04-29 DIAGNOSIS — E66811 Obesity, class 1: Secondary | ICD-10-CM

## 2022-04-29 DIAGNOSIS — I2510 Atherosclerotic heart disease of native coronary artery without angina pectoris: Secondary | ICD-10-CM

## 2022-04-29 DIAGNOSIS — D229 Melanocytic nevi, unspecified: Secondary | ICD-10-CM | POA: Diagnosis not present

## 2022-04-29 DIAGNOSIS — Z1211 Encounter for screening for malignant neoplasm of colon: Secondary | ICD-10-CM | POA: Diagnosis not present

## 2022-04-29 DIAGNOSIS — J439 Emphysema, unspecified: Secondary | ICD-10-CM

## 2022-04-29 DIAGNOSIS — R7303 Prediabetes: Secondary | ICD-10-CM | POA: Diagnosis not present

## 2022-04-29 LAB — POCT GLYCOSYLATED HEMOGLOBIN (HGB A1C): HbA1c, POC (controlled diabetic range): 5.9 % (ref 0.0–7.0)

## 2022-04-29 NOTE — Progress Notes (Signed)
Patient ID: Stephen Jennings, male    DOB: 01/15/1956  MRN: [id number]  CC: Diabetes   Subjective: Stephen Jennings is a 66 y.o. male who presents for chronic ds management His concerns today include:  Patient with history of CAD(NSTEMI with stent to LAD 03/2019), ICM with last EF 35-40% 07/2021, ICD present,  former smoker, COPD, pre-DM 2, anemia (resolved on labs 10/21).     PreDM:   Results for orders placed or performed in visit on 04/29/22  POCT glycosylated hemoglobin (Hb A1C)  Result Value Ref Range   Hemoglobin A1C     HbA1c POC (<> result, manual entry)     HbA1c, POC (prediabetic range)     HbA1c, POC (controlled diabetic range) 5.9 0.0 - 7.0 %  on Jardiance Reports he does poorly with eating habits.  Snacks on cakes, chips; Drinks sweet tea and Pepsi.  Does eat a lot of fruits.  COPD:   Change from Spiriva and Dulera to just [facility] by Dr. Silas Flood on last visit with him 12/2021.  Reports he is doing much better on this inhaler.  No recent flares.  HYPERTENSION/CAD/chronic systolic CHF/HL Currently taking: see medication list: Zetia, pravastatin 80 mg daily, Entresto 49/51 mg twice a day, Brilinta 90 mg twice a day, Inspra 25 mg half tablet daily, furosemide 40 mg daily, bisoprolol 5 mg daily, Jardiance 10 mg Med Adherence: [x] Yes    [] No Medication side effects: [] Yes    [] No Adherence with salt restriction: [x] Yes    [] No Home Monitoring?: [] Yes    [] No Monitoring Frequency:  Home BP results range:  SOB? [] Yes    [x] No Chest Pain?: [] Yes    [x] No Leg swelling?: [] Yes    [x] No Headaches?: [] Yes    [x] No Dizziness? [] Yes    [x] No Comments:   Would like derm referral for several dark spots on back, RT upper arm and LT forearm  HM:  declines flu shot today.  Due for shingles vaccine.  Pt declines at this time.  Due for colon CA screen.  Prefers to get Cologuard test. Patient Active Problem List   Diagnosis Date Noted   Hyperlipidemia 02/06/2021    Elevated troponin    NSTEMI (non-ST elevated myocardial infarction) ([place]) 02/01/2021   COPD with acute exacerbation ([place]) 01/20/2021   Non-seasonal allergic rhinitis 01/20/2021   Pulmonary emphysema ([place]) 12/03/2020   SOB (shortness of breath) 12/02/2020   Upper respiratory tract infection 12/02/2020   History of tobacco abuse 56/25/6389   Chronic systolic heart failure ([place]) 10/05/2020   Coronary artery disease of native artery of native heart with stable angina pectoris ([place]) 10/05/2020   Gastroesophageal reflux disease without esophagitis 10/05/2020   Prediabetes 10/05/2020   Essential hypertension 06/08/2020   Obesity (BMI 30.0-34.9) 06/08/2020   Snoring 11/28/2019   Morning headache 11/28/2019   Chronic daily headache 10/08/2019   Coronary artery disease involving left main coronary artery 05/08/2019   History of placement of stent in LAD coronary artery 05/08/2019   Diabetes mellitus type II, controlled ([place]) 05/08/2019     Current Outpatient Medications on File Prior to Visit  Medication Sig Dispense Refill   Accu-Chek Softclix Lancets lancets Use to measure blood sugar twice a day 100 each 6   albuterol (PROVENTIL) (2.5 MG/3ML) 0.083% nebulizer solution Take 3 mLs (2.5 mg total) by nebulization every 6 (six) hours as needed for wheezing or shortness  of breath. 75 mL 12   albuterol (VENTOLIN HFA) 108 (90 Base) MCG/ACT inhaler Inhale 2 puffs into the lungs every 6 (six) hours as needed for wheezing or shortness of breath. 18 g 3   bisoprolol (ZEBETA) 5 MG tablet Take 1 tablet (5 mg total) by mouth daily. 30 tablet 4   Blood Glucose Monitoring Suppl (ACCU-CHEK GUIDE ME) w/Device KIT 1 kit by Does not apply route in the morning and at bedtime. Use to measure blood sugar twice a day 1 kit 0   Budeson-Glycopyrrol-Formoterol (BREZTRI AEROSPHERE) 160-9-4.8 MCG/ACT AERO Inhale 2 puffs into the lungs 2 (two) times daily. 10.7 g 11   co-enzyme Q-10 30 MG capsule Take 1 capsule (30 mg  total) by mouth daily. 90 capsule 3   empagliflozin (JARDIANCE) 10 MG TABS tablet Take 1 tablet (10 mg total) by mouth daily before breakfast. 90 tablet 3   eplerenone (INSPRA) 25 MG tablet Take 0.5 tablets (12.5 mg total) by mouth daily. 30 tablet 4   ezetimibe (ZETIA) 10 MG tablet Take 1 tablet (10 mg total) by mouth daily. 90 tablet 3   fluticasone (FLONASE) 50 MCG/ACT nasal spray USE 1 SPRAY(S) IN EACH NOSTRIL ONCE DAILY AS NEEDED FOR ALLERGIES OR  RHINITIS 16 g 2   furosemide (LASIX) 40 MG tablet Take 1 tablet (40 mg total) by mouth daily. Take 1 tablet 40 mg 30 tablet 6   glucose blood (ACCU-CHEK GUIDE) test strip Use to measure blood sugar twice a day 100 each 6   Hyprom-Naphaz-Polysorb-Zn Sulf (CLEAR EYES COMPLETE) SOLN Apply 1 drop to eye 3 (three) times daily. 15 mL 1   loratadine (CLARITIN) 10 MG tablet Take 1 tablet (10 mg total) by mouth daily. 30 tablet 11   montelukast (SINGULAIR) 10 MG tablet Take 1 tablet (10 mg total) by mouth at bedtime. 30 tablet 11   nitroGLYCERIN (NITROSTAT) 0.4 MG SL tablet Place 1 tablet (0.4 mg total) under the tongue every 5 (five) minutes x 3 doses as needed for chest pain. 25 tablet 3   omeprazole (PRILOSEC) 20 MG capsule Take 1 capsule by mouth once daily 30 capsule 11   pravastatin (PRAVACHOL) 80 MG tablet Take 1 tablet (80 mg total) by mouth every evening. 30 tablet 11   sacubitril-valsartan (ENTRESTO) 49-51 MG Take 1 tablet by mouth 2 (two) times daily. 60 tablet 2   sodium zirconium cyclosilicate (LOKELMA) 5 g packet Take 10 g by mouth daily. 30 each 11   ticagrelor (BRILINTA) 90 MG TABS tablet Take 1 tablet (90 mg total) by mouth 2 (two) times daily. 180 tablet 0   traZODone (DESYREL) 50 MG tablet TAKE 1/2 TO 1 (ONE-HALF TO ONE) TABLET BY MOUTH AT BEDTIME 10 tablet 0   No current facility-administered medications on file prior to visit.    Allergies  Allergen Reactions   Atorvastatin     Muscle weakness and fatigue on 63m daily    Spironolactone     gynecomastia   Metformin And Related Anxiety and Palpitations   Other Anxiety and Palpitations    Social History   Socioeconomic History   Marital status: Married    Spouse name: Not on file   Number of children: 1   Years of education: 8   Highest education level: 8th grade  Occupational History   Not on file  Tobacco Use   Smoking status: Former    Types: Cigarettes    Quit date: 04/08/2019    Years since quitting: 3.0  Passive exposure: Current (wife smokes)   Smokeless tobacco: Never  Vaping Use   Vaping Use: Never used  Substance and Sexual Activity   Alcohol use: No   Drug use: No   Sexual activity: Not Currently  Other Topics Concern   Not on file  Social History Narrative   Lives with wife   Right handed   Caffeine: about 2 cups of coffee every morning, no soda   Social Determinants of Health   Financial Resource Strain: Low Risk  (03/14/2022)   Overall Financial Resource Strain (CARDIA)    Difficulty of Paying Living Expenses: Not very hard  Food Insecurity: No Food Insecurity (03/14/2022)   Hunger Vital Sign    Worried About Running Out of Food in the Last Year: Never true    Ran Out of Food in the Last Year: Never true  Transportation Needs: No Transportation Needs (03/14/2022)   PRAPARE - Hydrologist (Medical): No    Lack of Transportation (Non-Medical): No  Physical Activity: Sufficiently Active (03/14/2022)   Exercise Vital Sign    Days of Exercise per Week: 7 days    Minutes of Exercise per Session: 60 min  Stress: No Stress Concern Present (03/14/2022)   [place]    Feeling of Stress : Not at all  Social Connections: Moderately Isolated (03/14/2022)   Social Connection and Isolation Panel [NHANES]    Frequency of Communication with Friends and Family: More than three times a week    Frequency of Social Gatherings with Friends and  Family: More than three times a week    Attends Religious Services: Never    Marine scientist or Organizations: No    Attends Archivist Meetings: Never    Marital Status: Married  Human resources officer Violence: Not At Risk (03/14/2022)   Humiliation, Afraid, Rape, and Kick questionnaire    Fear of Current or Ex-Partner: No    Emotionally Abused: No    Physically Abused: No    Sexually Abused: No    Family History  Problem Relation Age of Onset   Hypertension Mother    Heart failure Mother    Headache Neg Hx    Migraines Neg Hx     Past Surgical History:  Procedure Laterality Date   CARDIAC CATHETERIZATION     CORONARY BALLOON ANGIOPLASTY N/A 02/04/2021   Procedure: CORONARY BALLOON ANGIOPLASTY;  Surgeon: Troy Sine, MD;  Location: [facility];  Service: Cardiovascular;  Laterality: N/A;   CORONARY THROMBECTOMY N/A 02/04/2021   Procedure: Coronary Thrombectomy;  Surgeon: Troy Sine, MD;  Location: [place] [facility];  Service: Cardiovascular;  Laterality: N/A;   CORONARY/GRAFT ACUTE MI REVASCULARIZATION N/A 03/31/2019   Procedure: Coronary/Graft Acute MI Revascularization;  Surgeon: Troy Sine, MD;  Location: [place];  Service: Cardiovascular;  Laterality: N/A;   ICD IMPLANT N/A 04/15/2019   Procedure: ICD IMPLANT;  Surgeon: Constance Haw, MD;  Location: [place] [facility];  Service: Cardiovascular;  Laterality: N/A;   INTRAVASCULAR ULTRASOUND/IVUS N/A 02/04/2021   Procedure: Intravascular Ultrasound/IVUS;  Surgeon: Troy Sine, MD;  Location: [facility];  Service: Cardiovascular;  Laterality: N/A;   LEFT HEART CATH AND CORONARY ANGIOGRAPHY N/A 03/31/2019   Procedure: LEFT HEART CATH AND CORONARY ANGIOGRAPHY;  Surgeon: Troy Sine, MD;  Location: [place] [facility];  Service: Cardiovascular;  Laterality: N/A;   LEFT HEART CATH  AND CORONARY ANGIOGRAPHY N/A 02/02/2021   Procedure: LEFT HEART CATH AND CORONARY ANGIOGRAPHY;   Surgeon: Troy Sine, MD;  Location: [place] [facility];  Service: Cardiovascular;  Laterality: N/A;   LEFT HEART CATH AND CORONARY ANGIOGRAPHY N/A 02/04/2021   Procedure: LEFT HEART CATH AND CORONARY ANGIOGRAPHY;  Surgeon: Troy Sine, MD;  Location: [place] [facility];  Service: Cardiovascular;  Laterality: N/A;   NO PAST SURGERIES     RIGHT HEART CATH N/A 03/31/2019   Procedure: RIGHT HEART CATH;  Surgeon: Troy Sine, MD;  Location: [place] [facility];  Service: Cardiovascular;  Laterality: N/A;   VENTRICULAR ASSIST DEVICE INSERTION N/A 03/31/2019   Procedure: VENTRICULAR ASSIST DEVICE INSERTION;  Surgeon: Troy Sine, MD;  Location: [place] [facility];  Service: Cardiovascular;  Laterality: N/A;    ROS: Review of Systems Negative except as stated above  PHYSICAL EXAM: BP 129/79   Pulse 72   Temp 98.6 F (37 C) (Oral)   Ht 5' 8" (1.727 m)   Wt 201 lb 9.6 oz (91.4 kg)   SpO2 96%   BMI 30.65 kg/m   Wt Readings from Last 3 Encounters:  04/29/22 201 lb 9.6 oz (91.4 kg)  03/16/22 207 lb (93.9 kg)  01/03/22 206 lb 9.6 oz (93.7 kg)    Physical Exam  General appearance - alert, well appearing, older Caucasian male and in no distress Mental status - normal mood, behavior, speech, dress, motor activity, and thought processes Neck - supple, no significant adenopathy Chest - clear to auscultation, no wheezes, rales or rhonchi, symmetric air entry Heart - normal rate, regular rhythm, normal S1, S2, no murmurs, rubs, clicks or gallops Extremities - peripheral pulses normal, no pedal edema, no clubbing or cyanosis Skin: Patient with 2 cm round hyperpigmented mole on the inner aspect of the right upper arm.  Several smaller keratosis lesion on the posterior thorax.     Latest Ref Rng & Units 07/27/2021   10:40 AM 05/25/2021    9:30 AM 05/07/2021    8:50 AM  CMP  Glucose 70 - 99 mg/dL 96  122  127   BUN 8 - 23 mg/dL _0 Creatinine 0.61 - 1.24 mg/dL 0.91  1.19  1.32    Sodium 135 - 145 mmol/L 138  141  138   Potassium 3.5 - 5.1 mmol/L 3.6  3.8  5.0   Chloride 98 - 111 mmol/L 105  107  108   CO2 22 - 32 mmol/L _1 Calcium 8.9 - 10.3 mg/dL 9.0  9.5  9.4   Total Protein 6.5 - 8.1 g/dL 6.0  6.7    Total Bilirubin 0.3 - 1.2 mg/dL 0.5  0.6    Alkaline Phos 38 - 126 U/L 67  76    AST 15 - 41 U/L 35  33    ALT 0 - 44 U/L 75  29     Lipid Panel     Component Value Date/Time   CHOL 98 07/27/2021 1040   CHOL 117 11/16/2020 0912   TRIG 30 07/27/2021 1040   HDL 56 07/27/2021 1040   HDL 37 (L) 11/16/2020 0912   CHOLHDL 1.8 07/27/2021 1040   VLDL 6 07/27/2021 1040   LDLCALC 36 07/27/2021 1040   LDLCALC 66 11/16/2020 0912    CBC    Component Value Date/Time   WBC 8.1 10/04/2021 1617   RBC 4.63 10/04/2021 1617  HGB 13.6 10/04/2021 1617   HGB 13.1 04/23/2021 1025   HCT 41.0 10/04/2021 1617   HCT 39.9 04/23/2021 1025   PLT 225.0 10/04/2021 1617   PLT 258 04/23/2021 1025   MCV 88.4 10/04/2021 1617   MCV 90 04/23/2021 1025   MCH 29.4 04/23/2021 1025   MCH 29.2 02/06/2021 0311   MCHC 33.3 10/04/2021 1617   RDW 15.9 (H) 10/04/2021 1617   RDW 14.1 04/23/2021 1025   LYMPHSABS 1.3 10/04/2021 1617   MONOABS 0.8 10/04/2021 1617   EOSABS 0.3 10/04/2021 1617   BASOSABS 0.1 10/04/2021 1617    ASSESSMENT AND PLAN: 1. Coronary artery disease involving native coronary artery of native heart without angina pectoris Stable without any recent use of sublingual nitroglycerin.  Followed by cardiology. Continue Pravachol, Zetia, bisoprolol, Brilinta  2. Essential hypertension Controlled on current medications listed above.  Continue medications and low-salt diet. - Comprehensive metabolic panel  3. Pulmonary emphysema, unspecified emphysema type ([place]) Stable on Breztri inhaler.  4. Prediabetes 5. Obesity (BMI 30.0-34.9) Patient advised to eliminate sugary drinks from the diet, cut back on portion sizes especially of white carbohydrates, eat more  white lean meat like chicken [place] and seafood instead of beef or pork and incorporate fresh fruits and vegetables into the diet daily.  - POCT glycosylated hemoglobin (Hb A1C)   6. Screening for colon cancer - Cologuard  7. Influenza vaccination declined Recommended.  Patient declined at this time.  States he may get it late in the fall.  8. Chronic systolic heart failure (HCC) Stable and compensated.  Continue Entresto, Jardiance, bisoprolol  9. Atypical mole - Ambulatory referral to Dermatology    Patient was given the opportunity to ask questions.  Patient verbalized understanding of the plan and was able to repeat key elements of the plan.   This documentation was completed using Radio producer.  Any transcriptional errors are unintentional.  Orders Placed This Encounter  Procedures   POCT glycosylated hemoglobin (Hb A1C)     Requested Prescriptions    No prescriptions requested or ordered in this encounter    No follow-ups on file.  Karle Plumber, MD, FACP

## 2022-04-30 LAB — COMPREHENSIVE METABOLIC PANEL
ALT: 29 IU/L (ref 0–44)
AST: 28 IU/L (ref 0–40)
Albumin/Globulin Ratio: 1.8 (ref 1.2–2.2)
Albumin: 4.6 g/dL (ref 3.9–4.9)
Alkaline Phosphatase: 104 IU/L (ref 44–121)
BUN/Creatinine Ratio: 15 (ref 10–24)
BUN: 17 mg/dL (ref 8–27)
Bilirubin Total: 0.6 mg/dL (ref 0.0–1.2)
CO2: 25 mmol/L (ref 20–29)
Calcium: 9.7 mg/dL (ref 8.6–10.2)
Chloride: 103 mmol/L (ref 96–106)
Creatinine, Ser: 1.15 mg/dL (ref 0.76–1.27)
Globulin, Total: 2.5 g/dL (ref 1.5–4.5)
Glucose: 83 mg/dL (ref 70–99)
Potassium: 4.6 mmol/L (ref 3.5–5.2)
Sodium: 143 mmol/L (ref 134–144)
Total Protein: 7.1 g/dL (ref 6.0–8.5)
eGFR: 71 mL/min/{1.73_m2} (ref >59)

## 2022-05-06 ENCOUNTER — Ambulatory Visit: Payer: Medicare HMO | Admitting: Pulmonary Disease

## 2022-05-12 NOTE — Progress Notes (Signed)
Remote ICD transmission.   

## 2022-05-25 ENCOUNTER — Encounter: Payer: Self-pay | Admitting: Pulmonary Disease

## 2022-05-25 ENCOUNTER — Ambulatory Visit (INDEPENDENT_AMBULATORY_CARE_PROVIDER_SITE_OTHER): Payer: Medicare HMO | Admitting: Pulmonary Disease

## 2022-05-25 VITALS — BP 130/72 | HR 68 | Temp 98.6°F | Wt 200.0 lb

## 2022-05-25 DIAGNOSIS — R052 Subacute cough: Secondary | ICD-10-CM | POA: Diagnosis not present

## 2022-05-25 DIAGNOSIS — J4489 Other specified chronic obstructive pulmonary disease: Secondary | ICD-10-CM

## 2022-05-25 NOTE — Patient Instructions (Signed)
Nice to see you again  I am glad the breathing is doing okay, continue Breztri as prescribed  For the nasal congestion I suggest the following  1) use the sinus rinse bottle twice a day once in the morning and once in the evening for 1 week then decrease to once a day.  Use as directed.  Mixed distilled or purified water with the salt packets that can be purchased over-the-counter on the cold and flu.  2) after the sinus rinses, use Flonase 2 spray each nostril in the morning and 2 sprays each nostril in the evening for 1 week then decrease back to once a day  3) after the Flonase, use Afrin 2 sprays each nostril twice a day for 3 days then stop.  The Afrin can be purchased over-the-counter.  Let see if this helps with nasal congestion and cough.  If not improving in the next couple of weeks please let me know.  Return to clinic in 6 months or sooner as needed with Dr. Silas Flood.

## 2022-05-25 NOTE — Progress Notes (Signed)
@Patient  ID: Stephen Jennings, male    DOB: Stephen Jennings, 66 y.o.   MRN: [id number]  Chief Complaint  Patient presents with   Follow-up    Pt is here for follow up. Pt states he is doing well with his breathing and his DOE. Pt states that he is continuing to have congestion and his sinuses are feeling full. Pt states the Claritin is no longer helping. Not taking anything new OTC. Pt is on Breztri and is working well so far.     Referring provider: Ladell Pier, MD  HPI:   66 y.o. 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  Most recent cardiology note reviewed.  Most recent PCP note reviewed.  Overall, his breathing continues to do well on [place].  He has experienced increased congestion over the last few weeks.  Resume taking Claritin.  Minimal improvement.  A lot of sinus congestion, pressure.  With associated description of postnasal drip.  Mucus in the back of the throat and needing to cough in the mornings.  He continues to work and doing well outdoors in terms of dyspnea on exertion.  HPI initial visit: Patient notes several weeks ago he felt ill.  Cough, sore throat, runny nose.  Felt like his virus.  Felt more rundown, short of breath during that time.  Gradually symptoms seem to slowly improve.  However had residual dyspnea on exertion.  Worse with inclines or stairs.  Better with rest.  No environmental or seasonal changes he can identify to contribute.  Did not really try much to make things better or worse.  Cannot identify clear exacerbating or alleviating factors.  Also/13, he developed chest pain with radiation down left arm while doing yard work.  Presented to the ED. Troponins elevated. Medical therapy for NSTEMI started. LHC 6/14 with narrowing in prior stent in LAD. Placed on aggrastat and bolused brilinta. Repeat LHC 6/16 with improved narrowing/thrombus in prior stent. TTE with new reduced EF compared to 10/2020.  Diuresed, and started on lasix daily. He states this has helped immensely with DOE. Denies any respiratory issues. Denies any limitations due to dyspnea.  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  Working without issue.  CXR 02/01/21 on my review and interpretation with hyperinflation on lateral view, bronchitic changes bilaterally.  PMH: tobacco abuse in remission, CAD s/p DES Surgical history: cardiac cath Family History: CHF, HTN in mom Social history: former smoker, quit 2020 at time of first heart attack, 40 pack year history, lives in [place] / Pulmonary Flowsheets:   ACT:      No data to display           MMRC:     No data to display           Epworth:      No data to display           Tests:   FENO:  No results found for: "NITRICOXIDE"  PFT:    Latest Ref Rng & Units 10/07/2021    9:44 AM  PFT Results  FVC-Pre L 2.77   FVC-Predicted Pre % 60   FVC-Post L 3.36   FVC-Predicted Post % 73   Pre FEV1/FVC % % 58   Post FEV1/FCV % % 62   FEV1-Pre L 1.62   FEV1-Predicted Pre % 47   FEV1-Post L 2.07   DLCO uncorrected ml/min/mmHg 20.92   DLCO UNC% %  78   DLCO corrected ml/min/mmHg 21.56   DLCO COR %Predicted % 80   DLVA Predicted % 82   TLC L 8.34   TLC % Predicted % 118   RV % Predicted % 227   Personally reviewed and interpreted as severe fixed obstruction with significant improvement after significant bronchodilator response.  Lung volumes consistent with air trapping. WALK:      No data to display           Imaging: No results found.  Lab Results: Personally reviewed and as per per EMR, eos 300 2020 CBC    Component Value Date/Time   WBC 8.1 10/04/2021 1617   RBC 4.63 10/04/2021 1617   HGB 13.6 10/04/2021 1617   HGB 13.1 04/23/2021 1025   HCT 41.0 10/04/2021 1617   HCT 39.9 04/23/2021 1025   PLT 225.0 10/04/2021 1617   PLT 258 04/23/2021 1025   MCV 88.4 10/04/2021 1617   MCV 90 04/23/2021  1025   MCH 29.4 04/23/2021 1025   MCH 29.2 02/06/2021 0311   MCHC 33.3 10/04/2021 1617   RDW 15.9 (H) 10/04/2021 1617   RDW 14.1 04/23/2021 1025   LYMPHSABS 1.3 10/04/2021 1617   MONOABS 0.8 10/04/2021 1617   EOSABS 0.3 10/04/2021 1617   BASOSABS 0.1 10/04/2021 1617    BMET    Component Value Date/Time   NA 143 04/29/2022 1618   K 4.6 04/29/2022 1618   CL 103 04/29/2022 1618   CO2 25 04/29/2022 1618   GLUCOSE 83 04/29/2022 1618   GLUCOSE 96 07/27/2021 1040   BUN 17 04/29/2022 1618   CREATININE 1.15 04/29/2022 1618   CALCIUM 9.7 04/29/2022 1618   GFRNONAA >60 07/27/2021 1040   GFRAA 99 06/08/2020 1003    BNP    Component Value Date/Time   BNP 415.9 (H) 07/27/2021 1040    ProBNP    Component Value Date/Time   PROBNP 189.0 (H) 10/04/2021 1617    Specialty Problems       Pulmonary Problems   Snoring   SOB (shortness of breath)   Upper respiratory tract infection   Pulmonary emphysema (HCC)   COPD with acute exacerbation (HCC)   Non-seasonal allergic rhinitis    Allergies  Allergen Reactions   Atorvastatin     Muscle weakness and fatigue on 74m daily   Spironolactone     gynecomastia   Metformin And Related Anxiety and Palpitations   Other Anxiety and Palpitations    Immunization History  Administered Date(s) Administered   Influenza,inj,Quad PF,6+ Mos 05/08/2019, 06/08/2020, 04/23/2021   PFIZER(Purple Top)SARS-COV-2 Vaccination 11/04/2019, 11/25/2019   PNEUMOCOCCAL CONJUGATE-20 04/23/2021   Pneumococcal Polysaccharide-23 06/05/2019   Tetanus 05/08/2019    Past Medical History:  Diagnosis Date   Cardiogenic shock (HCC)    [place] ([place]estive [place]) [place]    Coronary artery disease    Diabetes mellitus without complication [place]    Hyperlipidemia    Medical history non-contributory     Tobacco History: Social History   Tobacco Use  Smoking Status Former   Types: Cigarettes   Quit date: 04/08/2019   Years since quitting: 3.1    Passive exposure: Current (wife smokes)  Smokeless Tobacco Never   Counseling given: Not Answered   Continue to not smoke  Outpatient Encounter Medications as of 05/25/2022  Medication Sig   Accu-Chek Softclix Lancets lancets Use to measure blood sugar twice a day   albuterol (PROVENTIL) (2.5 MG/3ML) 0.083% nebulizer solution Take 3 mLs (2.5 mg total) by  nebulization every 6 (six) hours as needed for wheezing or shortness of breath.   albuterol (VENTOLIN HFA) 108 (90 Base) MCG/ACT inhaler Inhale 2 puffs into the lungs every 6 (six) hours as needed for wheezing or shortness of breath.   bisoprolol (ZEBETA) 5 MG tablet Take 1 tablet (5 mg total) by mouth daily.   Blood Glucose Monitoring Suppl (ACCU-CHEK GUIDE ME) w/Device KIT 1 kit by Does not apply route in the morning and at bedtime. Use to measure blood sugar twice a day   Budeson-Glycopyrrol-Formoterol (BREZTRI AEROSPHERE) 160-9-4.8 MCG/ACT AERO Inhale 2 puffs into the lungs 2 (two) times daily.   co-enzyme Q-10 30 MG capsule Take 1 capsule (30 mg total) by mouth daily.   empagliflozin (JARDIANCE) 10 MG TABS tablet Take 1 tablet (10 mg total) by mouth daily before breakfast.   eplerenone (INSPRA) 25 MG tablet Take 0.5 tablets (12.5 mg total) by mouth daily.   ezetimibe (ZETIA) 10 MG tablet Take 1 tablet (10 mg total) by mouth daily.   fluticasone (FLONASE) 50 MCG/ACT nasal spray USE 1 SPRAY(S) IN EACH NOSTRIL ONCE DAILY AS NEEDED FOR ALLERGIES OR  RHINITIS   furosemide (LASIX) 40 MG tablet Take 1 tablet (40 mg total) by mouth daily. Take 1 tablet 40 mg   glucose blood (ACCU-CHEK GUIDE) test strip Use to measure blood sugar twice a day   Hyprom-Naphaz-Polysorb-Zn Sulf (CLEAR EYES COMPLETE) SOLN Apply 1 drop to eye 3 (three) times daily.   loratadine (CLARITIN) 10 MG tablet Take 1 tablet (10 mg total) by mouth daily.   montelukast (SINGULAIR) 10 MG tablet Take 1 tablet (10 mg total) by mouth at bedtime.   nitroGLYCERIN (NITROSTAT) 0.4 MG SL  tablet Place 1 tablet (0.4 mg total) under the tongue every 5 (five) minutes x 3 doses as needed for chest pain.   omeprazole (PRILOSEC) 20 MG capsule Take 1 capsule by mouth once daily   pravastatin (PRAVACHOL) 80 MG tablet Take 1 tablet (80 mg total) by mouth every evening.   sacubitril-valsartan (ENTRESTO) 49-51 MG Take 1 tablet by mouth 2 (two) times daily.   sodium zirconium cyclosilicate (LOKELMA) 5 g packet Take 10 g by mouth daily.   ticagrelor (BRILINTA) 90 MG TABS tablet Take 1 tablet (90 mg total) by mouth 2 (two) times daily.   traZODone (DESYREL) 50 MG tablet TAKE 1/2 TO 1 (ONE-HALF TO ONE) TABLET BY MOUTH AT BEDTIME   No facility-administered encounter medications on file as of 05/25/2022.     Review of Systems  Review of Systems  N/a  Physical Exam  BP 130/72 (BP Location: Left Arm, Patient Position: Sitting, Cuff Size: Normal)   Pulse 68   Temp 98.6 F (37 C) (Oral)   Wt 200 lb (90.7 kg)   SpO2 98%   BMI 30.41 kg/m   Wt Readings from Last 5 Encounters:  05/25/22 200 lb (90.7 kg)  04/29/22 201 lb 9.6 oz (91.4 kg)  03/16/22 207 lb (93.9 kg)  01/03/22 206 lb 9.6 oz (93.7 kg)  10/04/21 204 lb 3.2 oz (92.6 kg)    BMI Readings from Last 5 Encounters:  05/25/22 30.41 kg/m  04/29/22 30.65 kg/m  03/16/22 31.47 kg/m  01/03/22 31.41 kg/m  10/04/21 31.05 kg/m     Physical Exam General: well appearing, in NAD Eyes: EOMI, no icterus  Neck: Supple, no JVP Cardiovascular: Regular rate and rhythm, normal Pulmonary: Clear to auscultation bilaterally, distant, normal work of breathing MSK: No synovitis, no joint effusion Neuro: Normal gait,  no weakness Psych: Normal mood, full affect   Assessment & Plan:   DOE: Likely component of cardiac cause given rising BNP and new left atrial dilation.  Improved with Dulera, worse when outdoors was argues for possible asthma.  At risk for cigarette related lung disease as well.  PFTs confirm COPD as well as asthma with  bronchodilator response.  Overall, improved with triple inhaled therapy via Breztri.  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  PFTs notable for significant bronchodilator response in addition to severe fixed obstruction.  Continue triple inhaled therapy via Breztri.  Nasal/sinus congestion and postnasal drip with cough: Suspect environmental allergens.  Minimal to no improvement with Claritin.  Continue Claritin.  Addition of sinus rinses twice daily for 7 days then daily, increase Flonase to twice daily for 7 days then return to daily, Afrin twice daily for 3 days.  Assess response.   Return in about 6 months (around 11/24/2022).   Lanier Clam, MD 05/25/2022

## 2022-06-01 ENCOUNTER — Encounter: Payer: Self-pay | Admitting: Internal Medicine

## 2022-06-01 ENCOUNTER — Other Ambulatory Visit (HOSPITAL_COMMUNITY): Payer: Self-pay

## 2022-06-01 MED ORDER — TICAGRELOR 90 MG PO TABS: 90.0000 mg | ORAL_TABLET | Freq: Two times a day (BID) | ORAL | 1 refills | Status: DC

## 2022-06-01 NOTE — Progress Notes (Signed)
Received letter from [facility] dated 05/25/2022.  They have attempted to contact patient's several times to that date and patient has not returned the call.  They are closing the referral.

## 2022-06-20 ENCOUNTER — Other Ambulatory Visit: Payer: Self-pay | Admitting: Cardiovascular Disease

## 2022-07-15 ENCOUNTER — Other Ambulatory Visit: Payer: Self-pay | Admitting: Internal Medicine

## 2022-07-15 DIAGNOSIS — G4709 Other insomnia: Secondary | ICD-10-CM

## 2022-07-15 DIAGNOSIS — J3089 Other allergic rhinitis: Secondary | ICD-10-CM

## 2022-07-15 NOTE — Telephone Encounter (Signed)
Medication Refill - Medication: fluticasone (FLONASE) 50 MCG/ACT nasal spray, traZODone (DESYREL) 50 MG tablet   Has the patient contacted their pharmacy? Yes.     Preferred Pharmacy (with phone number or street name):  [facility] [address] Phone: [phone]  Fax: [phone]     Has the patient been seen for an appointment in the last year OR does the patient have an upcoming appointment? Yes.    The patient states he hasn't had these meds for close to a month and has contacted his pharmacy. Please assist patient further

## 2022-07-16 ENCOUNTER — Emergency Department (HOSPITAL_BASED_OUTPATIENT_CLINIC_OR_DEPARTMENT_OTHER): Payer: Medicare HMO | Admitting: Radiology

## 2022-07-16 ENCOUNTER — Other Ambulatory Visit: Payer: Self-pay

## 2022-07-16 ENCOUNTER — Emergency Department (HOSPITAL_BASED_OUTPATIENT_CLINIC_OR_DEPARTMENT_OTHER): Payer: Medicare HMO

## 2022-07-16 ENCOUNTER — Emergency Department (HOSPITAL_BASED_OUTPATIENT_CLINIC_OR_DEPARTMENT_OTHER)
Admission: EM | Admit: 2022-07-16 | Discharge: 2022-07-17 | Disposition: A | Discharge status: Home / Self Care | Payer: Medicare HMO | Attending: Emergency Medicine | Admitting: Emergency Medicine

## 2022-07-16 DIAGNOSIS — J219 Acute bronchiolitis, unspecified: Secondary | ICD-10-CM | POA: Diagnosis not present

## 2022-07-16 DIAGNOSIS — J449 Chronic obstructive pulmonary disease, unspecified: Secondary | ICD-10-CM | POA: Insufficient documentation

## 2022-07-16 DIAGNOSIS — R0602 Shortness of breath: Secondary | ICD-10-CM | POA: Diagnosis not present

## 2022-07-16 DIAGNOSIS — E119 Type 2 diabetes mellitus without complications: Secondary | ICD-10-CM | POA: Diagnosis not present

## 2022-07-16 DIAGNOSIS — Z7984 Long term (current) use of oral hypoglycemic drugs: Secondary | ICD-10-CM | POA: Diagnosis not present

## 2022-07-16 DIAGNOSIS — I5022 Chronic systolic (congestive) heart failure: Secondary | ICD-10-CM

## 2022-07-16 DIAGNOSIS — I110 Hypertensive heart disease with heart failure: Secondary | ICD-10-CM | POA: Diagnosis not present

## 2022-07-16 DIAGNOSIS — M791 Myalgia, unspecified site: Secondary | ICD-10-CM | POA: Insufficient documentation

## 2022-07-16 DIAGNOSIS — J209 Acute bronchitis, unspecified: Secondary | ICD-10-CM | POA: Diagnosis not present

## 2022-07-16 DIAGNOSIS — Z7951 Long term (current) use of inhaled steroids: Secondary | ICD-10-CM | POA: Insufficient documentation

## 2022-07-16 DIAGNOSIS — I509 Heart failure, unspecified: Secondary | ICD-10-CM | POA: Diagnosis not present

## 2022-07-16 DIAGNOSIS — E876 Hypokalemia: Secondary | ICD-10-CM | POA: Diagnosis not present

## 2022-07-16 DIAGNOSIS — Z1152 Encounter for screening for COVID-19: Secondary | ICD-10-CM | POA: Insufficient documentation

## 2022-07-16 DIAGNOSIS — R519 Headache, unspecified: Secondary | ICD-10-CM

## 2022-07-16 LAB — CBC WITH DIFFERENTIAL/PLATELET
Abs Immature Granulocytes: 0.03 10*3/uL (ref 0.00–0.07)
Basophils Absolute: 0 10*3/uL (ref 0.0–0.1)
Basophils Relative: 0 %
Eosinophils Absolute: 0.1 10*3/uL (ref 0.0–0.5)
Eosinophils Relative: 1 %
HCT: 37.3 % — ABNORMAL LOW (ref 39.0–52.0)
Hemoglobin: 12.4 g/dL — ABNORMAL LOW (ref 13.0–17.0)
Immature Granulocytes: 0 %
Lymphocytes Relative: 10 %
Lymphs Abs: 0.9 10*3/uL (ref 0.7–4.0)
MCH: 29.8 pg (ref 26.0–34.0)
MCHC: 33.2 g/dL (ref 30.0–36.0)
MCV: 89.7 fL (ref 80.0–100.0)
Monocytes Absolute: 0.9 10*3/uL (ref 0.1–1.0)
Monocytes Relative: 10 %
Neutro Abs: 7.1 10*3/uL (ref 1.7–7.7)
Neutrophils Relative %: 79 %
Platelets: 185 10*3/uL (ref 150–400)
RBC: 4.16 MIL/uL — ABNORMAL LOW (ref 4.22–5.81)
RDW: 14.5 % (ref 11.5–15.5)
WBC: 9.1 10*3/uL (ref 4.0–10.5)
nRBC: 0 % (ref 0.0–0.2)

## 2022-07-16 LAB — BASIC METABOLIC PANEL
Anion gap: 14 (ref 5–15)
BUN: 11 mg/dL (ref 8–23)
CO2: 24 mmol/L (ref 22–32)
Calcium: 9.5 mg/dL (ref 8.9–10.3)
Chloride: 103 mmol/L (ref 98–111)
Creatinine, Ser: 0.86 mg/dL (ref 0.61–1.24)
GFR, Estimated: >60 mL/min (ref >60)
Glucose, Bld: 105 mg/dL — ABNORMAL HIGH (ref 70–99)
Potassium: 3.3 mmol/L — ABNORMAL LOW (ref 3.5–5.1)
Sodium: 141 mmol/L (ref 135–145)

## 2022-07-16 LAB — RESP PANEL BY RT-PCR (FLU A&B, COVID) ARPGX2
Influenza A by PCR: NEGATIVE
Influenza B by PCR: NEGATIVE
SARS Coronavirus 2 by RT PCR: NEGATIVE

## 2022-07-16 LAB — BRAIN NATRIURETIC PEPTIDE: B Natriuretic Peptide: 1853.1 pg/mL — ABNORMAL HIGH (ref 0.0–100.0)

## 2022-07-16 MED ORDER — ALBUTEROL SULFATE HFA 108 (90 BASE) MCG/ACT IN AERS: 2.0000 | INHALATION_SPRAY | RESPIRATORY_TRACT | Status: DC | PRN

## 2022-07-16 MED ORDER — DOXYCYCLINE HYCLATE 100 MG PO TABS
100.0000 mg | ORAL_TABLET | Freq: Once | ORAL | Status: AC
  Administered 2022-07-16: 100 mg via ORAL
  Filled 2022-07-16: qty 1

## 2022-07-16 MED ORDER — POTASSIUM CHLORIDE CRYS ER 20 MEQ PO TBCR
20.0000 meq | EXTENDED_RELEASE_TABLET | Freq: Once | ORAL | Status: AC
  Administered 2022-07-16: 20 meq via ORAL
  Filled 2022-07-16: qty 1

## 2022-07-16 MED ORDER — METOCLOPRAMIDE HCL 5 MG/ML IJ SOLN
10.0000 mg | Freq: Once | INTRAMUSCULAR | Status: AC
  Administered 2022-07-16: 10 mg via INTRAVENOUS
  Filled 2022-07-16: qty 2

## 2022-07-16 NOTE — ED Triage Notes (Signed)
Patient arrives ambulatory to triage with complaints of worsening shortness of breath x1 day.  Reports cough with minimal sputum and ongoing headache. Rates head a 10/10.

## 2022-07-16 NOTE — ED Provider Notes (Signed)
[facility] EMERGENC[facility] DEPT Provider Note   CSN: [id number] Arrival date & time: 07/16/22  1829     History {Add pertinent medical, surgical, social history, OB history to HPI:1} Chief Complaint  Patient presents with   Shortness of Breath    Stephen Jennings is a 67 y.o. male.  He has a history of COPD cardiac disease diabetes.  He said he started with a posterior headache a couple of days ago followed by rhinorrhea cough and body aches.  He is not sure if he had a fever.  He tried Tylenol for his head which did not help at all.  He said the cough was productive of some brown sputum.  Former smoker.  The history is provided by the patient and the spouse.  Shortness of Breath Severity:  Moderate Onset quality:  Gradual Duration:  2 days Timing:  Constant Progression:  Unchanged Chronicity:  New Relieved by:  Nothing Worsened by:  Activity and coughing Ineffective treatments:  None tried Associated symptoms: cough, headaches and sputum production   Associated symptoms: no abdominal pain, no diaphoresis, no fever, no hemoptysis, no rash and no vomiting        Home Medications Prior to Admission medications   Medication Sig Start Date End Date Taking? Authorizing Provider  Accu-Chek Softclix Lancets lancets Use to measure blood sugar twice a day 11/06/19   Ladell Pier, MD  albuterol (PROVENTIL) (2.5 MG/3ML) 0.083% nebulizer solution Take 3 mLs (2.5 mg total) by nebulization every 6 (six) hours as needed for wheezing or shortness of breath. 08/23/21   Blanchie Dessert, MD  albuterol (VENTOLIN HFA) 108 (90 Base) MCG/ACT inhaler Inhale 2 puffs into the lungs every 6 (six) hours as needed for wheezing or shortness of breath. 01/03/22   Hunsucker, Bonna Gains, MD  bisoprolol (ZEBETA) 5 MG tablet Take 1 tablet (5 mg total) by mouth daily. 04/06/21   Rafael Bihari, [name]  Blood Glucose Monitoring Suppl (ACCU-CHEK GUIDE ME) w/Device KIT 1 kit by Does not apply route in  the morning and at bedtime. Use to measure blood sugar twice a day 11/06/19   Ladell Pier, MD  Budeson-Glycopyrrol-Formoterol (BREZTRI AEROSPHERE) 160-9-4.8 MCG/ACT AERO Inhale 2 puffs into the lungs 2 (two) times daily. 01/03/22   Hunsucker, Bonna Gains, MD  co-enzyme Q-10 30 MG capsule Take 1 capsule (30 mg total) by mouth daily. 05/25/21   Milford, [name], [name]  empagliflozin (JARDIANCE) 10 MG TABS tablet Take 1 tablet (10 mg total) by mouth daily before breakfast. 02/15/21   Bensimhon, Shaune Pascal, MD  eplerenone (INSPRA) 25 MG tablet Take 0.5 tablets (12.5 mg total) by mouth daily. 04/27/21   Milford, [name], [name]  ezetimibe (ZETIA) 10 MG tablet Take 1 tablet (10 mg total) by mouth daily. 02/06/21   Duke, Tami Lin, PA  fluticasone (FLONASE) 50 MCG/ACT nasal spray USE 1 SPRAY(S) IN EACH NOSTRIL ONCE DAILY AS NEEDED FOR ALLERGIES OR  RHINITIS 02/21/22   Ladell Pier, MD  furosemide (LASIX) 40 MG tablet Take 1 tablet (40 mg total) by mouth daily. Take 1 tablet 40 mg 07/27/21   Bensimhon, Shaune Pascal, MD  glucose blood (ACCU-CHEK GUIDE) test strip Use to measure blood sugar twice a day 11/06/19   Ladell Pier, MD  Hyprom-Naphaz-Polysorb-Zn Sulf (CLEAR EYES COMPLETE) SOLN Apply 1 drop to eye 3 (three) times daily. 05/08/19   [name], MD  loratadine (CLARITIN) 10 MG tablet Take 1 tablet (10 mg total) by mouth daily.  01/03/22   Hunsucker, Bonna Gains, MD  montelukast (SINGULAIR) 10 MG tablet Take 1 tablet (10 mg total) by mouth at bedtime. 01/03/22   Hunsucker, Bonna Gains, MD  nitroGLYCERIN (NITROSTAT) 0.4 MG SL tablet Place 1 tablet (0.4 mg total) under the tongue every 5 (five) minutes x 3 doses as needed for chest pain. 02/06/21   Duke, Tami Lin, PA  omeprazole (PRILOSEC) 20 MG capsule Take 1 capsule by mouth once daily 05/07/21   Ladell Pier, MD  pravastatin (PRAVACHOL) 80 MG tablet Take 1 tablet (80 mg total) by mouth every evening. 05/25/21 05/25/22  Rafael Bihari, [name]   sacubitril-valsartan (ENTRESTO) [phone] MG Take 1 tablet by mouth twice daily 06/21/22   Troy Sine, MD  sodium zirconium cyclosilicate (LOKELMA) 5 g packet Take 10 g by mouth daily. 05/06/21   Rafael Bihari, [name]  ticagrelor (BRILINTA) 90 MG TABS tablet Take 1 tablet (90 mg total) by mouth 2 (two) times daily. 06/01/22   Bensimhon, Shaune Pascal, MD  traZODone (DESYREL) 50 MG tablet TAKE 1/2 TO 1 (ONE-HALF TO ONE) TABLET BY MOUTH AT BEDTIME 04/27/22   Ladell Pier, MD      Allergies    Atorvastatin, Spironolactone, Metformin and related, and Other    Review of Systems   Review of Systems  Constitutional:  Negative for diaphoresis and fever.  HENT:  Positive for rhinorrhea.   Eyes:  Negative for visual disturbance.  Respiratory:  Positive for cough, sputum production and shortness of breath. Negative for hemoptysis.   Gastrointestinal:  Positive for nausea. Negative for abdominal pain and vomiting.  Musculoskeletal:  Positive for myalgias.  Skin:  Negative for rash.  Neurological:  Positive for headaches.    Physical Exam Updated Vital Signs BP (!) 165/100 (BP Location: Right Arm)   Pulse 60   Temp 98.1 F (36.7 C) (Oral)   Resp 20   Ht _0  (1.727 m)   Wt 90.7 kg   SpO2 96%   BMI 30.41 kg/m  Physical Exam Vitals and nursing note reviewed.  Constitutional:      General: He is not in acute distress.    Appearance: He is well-developed.  HENT:     Head: Normocephalic and atraumatic.  Eyes:     Conjunctiva/sclera: Conjunctivae normal.  Cardiovascular:     Rate and Rhythm: Normal rate and regular rhythm.     Heart sounds: No murmur heard. Pulmonary:     Effort: Pulmonary effort is normal. No respiratory distress.     Breath sounds: Normal breath sounds.  Abdominal:     Palpations: Abdomen is soft.     Tenderness: There is no abdominal tenderness.  Musculoskeletal:        General: No swelling.     Cervical back: Neck supple.     Right lower leg: No edema.      Left lower leg: No edema.  Skin:    General: Skin is warm and dry.     Capillary Refill: Capillary refill takes less than 2 seconds.  Neurological:     General: No focal deficit present.     Mental Status: He is alert.     ED Results / Procedures / Treatments   Labs (all labs ordered are listed, but only abnormal results are displayed) Labs Reviewed  RESP PANEL BY RT-PCR (FLU A&B, COVID) ARPGX2  BASIC METABOLIC PANEL  BRAIN NATRIURETIC PEPTIDE  CBC WITH DIFFERENTIAL/PLATELET    EKG EKG Interpretation  Date/Time:  Saturday  July 16 2022 18:43:11 EST Ventricular Rate:  78 PR Interval:  148 QRS Duration: 86 QT Interval:  360 QTC Calculation: 410 R Axis:   78 Text Interpretation: Normal sinus rhythm Septal infarct , age undetermined Lateral infarct , age undetermined Abnormal ECG When compared with ECG of 06-Apr-2021 08:56, Lateral infarct is now Present Nonspecific T wave abnormality, improved in Anterior leads Confirmed by Aletta Edouard [phone]) on 07/16/2022 6:48:23 PM  Radiology DG Chest 2 View  Result Date: 07/16/2022 CLINICAL DATA:  Shortness of breath for 1 day EXAM: CHEST - 2 VIEW COMPARISON:  08/23/2021 FINDINGS: Cardiac shadow is stable. Defibrillator is again noted and stable. The lungs are hyperinflated. Mild central vascular congestion is noted without edema. No focal infiltrate is seen. No acute bony abnormality is noted. IMPRESSION: Mild CHF. Electronically Signed   By: Inez Catalina M.D.   On: 07/16/2022 19:00    Procedures Procedures  {Document cardiac monitor, telemetry assessment procedure when appropriate:1}  Medications Ordered in ED Medications  albuterol (VENTOLIN HFA) 108 (90 Base) MCG/ACT inhaler 2 puff (has no administration in time range)    ED Course/ Medical Decision Making/ A&P                           Medical Decision Making Amount and/or Complexity of Data Reviewed Labs: ordered. Radiology: ordered.  Risk Prescription drug  management.   This patient complains of ***; this involves an extensive number of treatment Options and is a complaint that carries with it a high risk of complications and morbidity. The differential includes ***  I ordered, reviewed and interpreted labs, which included *** I ordered medication *** and reviewed PMP when indicated. I ordered imaging studies which included *** and I independently    visualized and interpreted imaging which showed *** Additional history obtained from *** Previous records obtained and reviewed *** I consulted *** and discussed lab and imaging findings and discussed disposition.  Cardiac monitoring reviewed, *** Social determinants considered, *** Critical Interventions: ***  After the interventions stated above, I reevaluated the patient and found *** Admission and further testing considered, ***   {Document critical care time when appropriate:1} {Document review of labs and clinical decision tools ie heart score, Chads2Vasc2 etc:1}  {Document your independent review of radiology images, and any outside records:1} {Document your discussion with family members, caretakers, and with consultants:1} {Document social determinants of health affecting pt's care:1} {Document your decision making why or why not admission, treatments were needed:1} Final Clinical Impression(s) / ED Diagnoses Final diagnoses:  None    Rx / DC Orders ED Discharge Orders     None

## 2022-07-16 NOTE — Discharge Instructions (Addendum)
Take 40 mg of Lasix in the morning and 20 mg of Lasix at night for 5 days. Then return to your regular dosing of 20 mg twice per day.

## 2022-07-17 MED ORDER — DOXYCYCLINE HYCLATE 100 MG PO CAPS
100.0000 mg | ORAL_CAPSULE | Freq: Two times a day (BID) | ORAL | 0 refills | Status: AC
  Filled 2022-07-17: qty 14, 7d supply, fill #0

## 2022-07-17 NOTE — ED Notes (Signed)
Pt ambulatory in room w pulse ox, O2 did not drop below 94%. Pt voiced no SHOB, no CP, no lightheadedness. EDP notified of same

## 2022-07-17 NOTE — ED Provider Notes (Signed)
I assumed care of this patient.  Please see previous provider note for further details of Hx, PE.  Briefly patient is a 66 y.o. male who presented cough and shortness of breath.  Likely bronchitis from COPD.  Felt better after albuterol puffs, doxycycline.  Chest x-ray notable for pulmonary vascular congestion.  Currently pending the BnP given her history of heart failure.  BNP greater than 1800 which is up from approximately 400 last year. No evidence of volume overload on exam and patient able to ambulate without desatting or was significant increased work of breathing. Feel patient is stable for continued outpatient management with increase in his Lasix for 5 days.  Recommend close follow-up with heart failure clinic.  He has an appointment in January.  We will reach out to coordinator to move up appointment.  The patient appears reasonably screened and/or stabilized for discharge and I doubt any other medical condition or other [facility] requiring further screening, evaluation, or treatment in the ED at this time. I have discussed the findings, Dx and Tx plan with the patient/family who expressed understanding and agree(s) with the plan. Discharge instructions discussed at length. The patient/family was given strict return precautions who verbalized understanding of the instructions. No further questions at time of discharge.  Disposition: Discharge  Condition: Good  ED Discharge Orders          Ordered    doxycycline (VIBRAMYCIN) 100 MG capsule  2 times daily        07/17/22 0206            Follow Up: Ladell Pier, MD [address] [place] [address] [phone]  Schedule an appointment as soon as possible for a visit  For recheck of your symptoms  [place] [id number] [phone] Call  if you have not been called about your appointment in 3-5  days         Fatima Blank, MD 07/17/22 [phone]

## 2022-07-18 ENCOUNTER — Other Ambulatory Visit (HOSPITAL_COMMUNITY): Payer: Self-pay

## 2022-07-18 NOTE — Telephone Encounter (Signed)
Requested medication (s) are due for refill today: routing for review  Requested medication (s) are on the active medication list:yes  Last refill:  04/27/22 and 02/21/22 for Flonase  Future visit scheduled: yes  Notes to clinic:  Unable to refill per protocol, Rx request for Flonase needs Sig, routing for review     Requested Prescriptions  Pending Prescriptions Disp Refills   traZODone (DESYREL) 50 MG tablet 10 tablet 0    Sig: TAKE 1/2 TO 1 (ONE-HALF TO ONE) TABLET BY MOUTH AT BEDTIME     Psychiatry: Antidepressants - Serotonin Modulator Passed - 07/15/2022 11:58 AM      Passed - Valid encounter within last 6 months    Recent Outpatient Visits           2 months ago Coronary artery disease involving native coronary artery of native heart without angina pectoris   [place], Neoma Laming B, MD   11 months ago Controlled type 2 diabetes mellitus with hyperglycemia, without long-term current use of insulin [facility])   [place], [place], [place]   12 months ago COPD with acute exacerbation [facility])   [place], [place]   1 year ago Controlled type 2 diabetes mellitus with other circulatory complication, without long-term current use of insulin ([place])   [place], Deborah B, MD   1 year ago Essential hypertension   [place], Stephen L, RPH-CPP       Future Appointments             In 1 month Wynetta Emery Dalbert Batman, MD [place]   In 1 month Troy Sine, MD [place] [facility] [place]. [facility]             fluticasone (FLONASE) 50 MCG/ACT nasal spray 16 g 2     Ear, Nose, and Throat: Nasal Preparations - Corticosteroids Passed - 07/15/2022 11:58 AM      Passed - Valid encounter within last 12 months    Recent  Outpatient Visits           2 months ago Coronary artery disease involving native coronary artery of native heart without angina pectoris   [place], Neoma Laming B, MD   11 months ago Controlled type 2 diabetes mellitus with hyperglycemia, without long-term current use of insulin [facility])   [place], [place], [place]   12 months ago COPD with acute exacerbation [facility])   [place], [place]   1 year ago Controlled type 2 diabetes mellitus with other circulatory complication, without long-term current use of insulin ([place])   [place], Deborah B, MD   1 year ago Essential hypertension   [place], Stephen L, RPH-CPP       Future Appointments             In 1 month Wynetta Emery Dalbert Batman, MD [place]   In 1 month Claiborne Billings Joyice Faster, MD [place] [facility] [place]. [place]

## 2022-07-19 MED ORDER — TRAZODONE HCL 50 MG PO TABS: ORAL_TABLET | ORAL | 0 refills | Status: DC

## 2022-07-19 MED ORDER — FLUTICASONE PROPIONATE 50 MCG/ACT NA SUSP: NASAL | 0 refills | Status: DC

## 2022-07-20 ENCOUNTER — Ambulatory Visit (INDEPENDENT_AMBULATORY_CARE_PROVIDER_SITE_OTHER): Payer: Medicare HMO

## 2022-07-20 ENCOUNTER — Encounter: Payer: Self-pay | Admitting: *Deleted

## 2022-07-20 ENCOUNTER — Telehealth: Payer: Self-pay | Admitting: *Deleted

## 2022-07-20 DIAGNOSIS — I255 Ischemic cardiomyopathy: Secondary | ICD-10-CM

## 2022-07-20 LAB — CUP PACEART REMOTE DEVICE CHECK
Battery Remaining Longevity: 69 mo
Battery Remaining Percentage: 70 %
Battery Voltage: 2.98 V
Brady Statistic AP VP Percent: 1 %
Brady Statistic AP VS Percent: 2.1 %
Brady Statistic AS VP Percent: 1 %
Brady Statistic AS VS Percent: 98 %
Brady Statistic RA Percent Paced: 1.8 %
Brady Statistic RV Percent Paced: 1 %
Date Time Interrogation Session: 20231129040017
HighPow Impedance: 82 Ohm
HighPow Impedance: 82 Ohm
Implantable Lead Connection Status: 753985
Implantable Lead Connection Status: 753985
Implantable Lead Implant Date: 20200824
Implantable Lead Implant Date: 20200824
Implantable Lead Location: 753859
Implantable Lead Location: 753860
Implantable Pulse Generator Implant Date: 20200824
Lead Channel Impedance Value: 340 Ohm
Lead Channel Impedance Value: 390 Ohm
Lead Channel Pacing Threshold Amplitude: 0.75 V
Lead Channel Pacing Threshold Amplitude: 0.75 V
Lead Channel Pacing Threshold Pulse Width: 0.5 ms
Lead Channel Pacing Threshold Pulse Width: 0.5 ms
Lead Channel Sensing Intrinsic Amplitude: 1.5 mV
Lead Channel Sensing Intrinsic Amplitude: 11.9 mV
Lead Channel Setting Pacing Amplitude: 2 V
Lead Channel Setting Pacing Amplitude: 2.5 V
Lead Channel Setting Pacing Pulse Width: 0.5 ms
Lead Channel Setting Sensing Sensitivity: 0.5 mV
Pulse Gen Serial Number: 1300954

## 2022-07-20 NOTE — Patient Outreach (Signed)
  Care Coordination [facility] Note Transition Care Management Follow-up Telephone Call Date of discharge and from where: 07/17/22 from [facility] How have you been since you were released from the hospital? Patient reports feeling about the same. Any questions or concerns? Yes Patient has concern regarding timing of follow up appointment with providers  Items Reviewed: Did the pt receive and understand the discharge instructions provided? Yes  Medications obtained and verified? Yes  Other? Yes Advised patient to contact HF Clinic to schedule earlier appointment if he hasn't heard from them by today Any new allergies since your discharge? No  Dietary orders reviewed? Yes Do you have support at home? Yes   Home Care and Equipment/Supplies: Were home health services ordered? not applicable If so, what is the name of the agency? N/A  Has the agency set up a time to come to the patient's home? not applicable Were any new equipment or medical supplies ordered?  No What is the name of the medical supply agency? N/A Were you able to get the supplies/equipment? not applicable Do you have any questions related to the use of the equipment or supplies? No  Functional Questionnaire: (I = Independent and D = Dependent) ADLs: I  Bathing/Dressing- I  Meal Prep- I  Eating- I  Maintaining continence- I  Transferring/Ambulation- I  Managing Meds- I  Follow up appointments reviewed:  PCP Hospital f/u appt confirmed? Yes  Scheduled to see PCP on 08/17/22 @ 2:10 pm. [place] [facility] f/u appt confirmed? Yes  Scheduled to see Cardiology on 09/13/22 @ 10:20 am. Are transportation arrangements needed? No  If their condition worsens, is the pt aware to call PCP or go to the Emergency Dept.? Yes Was the patient provided with contact information for the PCP's office or ED? No Was to pt encouraged to call back with questions or concerns? Yes  RNCM provided information for CM services. Patient declines CM  services at this time, but will reach out to [facility] when needed.  Lurena Joiner RN, BSN [place]  [employer] manager

## 2022-07-21 ENCOUNTER — Telehealth (HOSPITAL_COMMUNITY): Payer: Self-pay | Admitting: Family Medicine

## 2022-07-24 ENCOUNTER — Other Ambulatory Visit: Payer: Self-pay | Admitting: Internal Medicine

## 2022-07-30 ENCOUNTER — Other Ambulatory Visit: Payer: Self-pay | Admitting: Pulmonary Disease

## 2022-07-30 DIAGNOSIS — J329 Chronic sinusitis, unspecified: Secondary | ICD-10-CM

## 2022-07-30 DIAGNOSIS — J439 Emphysema, unspecified: Secondary | ICD-10-CM

## 2022-08-10 NOTE — Progress Notes (Signed)
Advanced Heart Failure Clinic Note   PCP: Ladell Pier, MD Primary Cardiologist: Shelva Majestic, MD  HF Cardiologist: Dr. Haroldine Laws  HPI: Stephen Jennings is a 67 y.o. 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   Brought to ER 03/31/19 with respiratory failure/cardiac arrest. Intubated in ER. Initial ECG with anterior ST elevation. Cath by Dr. Claiborne Billings with 3v CAD and totally occluded prox LAD. LCX anomalous off RCA. Underwent PCI of LAD which was diffusely diseased vessel. Developed cardiogenic shock requiring pressors and mechanical support with impella.  03/31/2019 Echo 20-25%. Hospital course complicated by Torsades. EP consulted placed ICD prior to discharge.    Echo 2/21  EF 45-50%   Echo 4/22 EF 55-60%    He stopped Brillinta in 4/22. Admitted in 6/22 with NSTEMI. Cath with patent proximal LAD stent with mild 20% in-stent narrowing.  There is significant thrombus burden immediately after the stent extending into the diagonal vessel and mid LAD with TIMI-3 flow. Treated with Aggrastat x 48 hours. ECHO 02/02/21 EF 35-40% Relook angiography of the left coronary system showed improvement in the previous thrombus burden in the diagonal and LAD distal stent and beyond but moderate residual thrombus was still present. Underwent successful percutaneous coronary intervention of the LAD utilizing Pronto thrombectomy, intravascular ultrasound and ultimate PTCA of the distal stented segment and mid LAD with residual narrowing 0%. Recommended indefinite DAPT.   Echo 07/27/21 EF 35-40%.   Seen in ED 07/14/22 for acute CHF. Advised he increase Lasix x 5 days. Discharged home.  Today he returns for HF follow up. Last seen 12/22. Overall feeling fine. He is not SOB working in his yard or with lifting. Denies palpitations, abnormal bleeding, CP, dizziness, edema, or PND/Orthopnea. Appetite ok. No fever or chills. Weight at home 195 pounds. Taking all medications. No longer  smoking.  Cardiac Studies  - Echo 12/22: EF 35-40%  Cath: 02/02/21 Prox RCA lesion is 20% stenosed. Prox LAD to Mid LAD lesion is 15% stenosed. 1st Diag lesion is 5% stenosed. Ost LAD to Prox LAD lesion is 20% stenosed. Dist LAD-1 lesion is 40% stenosed. Dist LAD-2 lesion is 80% stenosed. There is severe left ventricular systolic dysfunction. LV end diastolic pressure is severely elevated. The left ventricular ejection fraction is 25-35% by visual estimate.  Past Medical History:  Diagnosis Date   Cardiogenic shock ([place])    CHF (congestive heart failure) (HCC)    Coronary artery disease    Diabetes mellitus without complication ([place])    Hyperlipidemia    Medical history non-contributory    Current Outpatient Medications  Medication Sig Dispense Refill   Accu-Chek Softclix Lancets lancets Use to measure blood sugar twice a day 100 each 6   albuterol (PROVENTIL) (2.5 MG/3ML) 0.083% nebulizer solution Take 3 mLs (2.5 mg total) by nebulization every 6 (six) hours as needed for wheezing or shortness of breath. 75 mL 12   albuterol (VENTOLIN HFA) 108 (90 Base) MCG/ACT inhaler INHALE 2 PUFFS BY MOUTH EVERY 6 HOURS AS NEEDED FOR WHEEZING OR  SHORTNESS  OF  BREATH 18 g 4   bisoprolol (ZEBETA) 5 MG tablet Take 1 tablet (5 mg total) by mouth daily. 30 tablet 4   Blood Glucose Monitoring Suppl (ACCU-CHEK GUIDE ME) w/Device KIT 1 kit by Does not apply route in the morning and at bedtime. Use to measure blood sugar twice a day 1 kit 0   Budeson-Glycopyrrol-Formoterol (BREZTRI AEROSPHERE) 160-9-4.8 MCG/ACT AERO Inhale 2 puffs into  the lungs 2 (two) times daily. 10.7 g 11   co-enzyme Q-10 30 MG capsule Take 1 capsule (30 mg total) by mouth daily. 90 capsule 3   empagliflozin (JARDIANCE) 10 MG TABS tablet Take 1 tablet (10 mg total) by mouth daily before breakfast. 90 tablet 3   eplerenone (INSPRA) 25 MG tablet Take 0.5 tablets (12.5 mg total) by mouth daily. 30 tablet 4   ezetimibe (ZETIA) 10 MG  tablet Take 1 tablet (10 mg total) by mouth daily. 90 tablet 3   fluticasone (FLONASE) 50 MCG/ACT nasal spray USE 1 SPRAY(S) IN EACH NOSTRIL ONCE DAILY AS NEEDED FOR ALLERGIES OR  RHINITIS 48 g 0   furosemide (LASIX) 40 MG tablet Take 1 tablet (40 mg total) by mouth daily. Take 1 tablet 40 mg 30 tablet 6   glucose blood (ACCU-CHEK GUIDE) test strip Use to measure blood sugar twice a day 100 each 6   Hyprom-Naphaz-Polysorb-Zn Sulf (CLEAR EYES COMPLETE) SOLN Apply 1 drop to eye 3 (three) times daily. 15 mL 1   loratadine (CLARITIN) 10 MG tablet Take 1 tablet (10 mg total) by mouth daily. 30 tablet 11   montelukast (SINGULAIR) 10 MG tablet Take 1 tablet (10 mg total) by mouth at bedtime. 30 tablet 11   nitroGLYCERIN (NITROSTAT) 0.4 MG SL tablet Place 1 tablet (0.4 mg total) under the tongue every 5 (five) minutes x 3 doses as needed for chest pain. 25 tablet 3   omeprazole (PRILOSEC) 20 MG capsule Take 1 capsule by mouth once daily 30 capsule 11   sacubitril-valsartan (ENTRESTO) 49-51 MG Take 1 tablet by mouth twice daily 60 tablet 6   sodium zirconium cyclosilicate (LOKELMA) 5 g packet Take 10 g by mouth daily. 30 each 11   ticagrelor (BRILINTA) 90 MG TABS tablet Take 1 tablet (90 mg total) by mouth 2 (two) times daily. 180 tablet 1   traZODone (DESYREL) 50 MG tablet TAKE 1/2 TO 1 (ONE-HALF TO ONE) TABLET BY MOUTH AT BEDTIME 90 tablet 0   pravastatin (PRAVACHOL) 80 MG tablet Take 1 tablet (80 mg total) by mouth every evening. 30 tablet 11   No current facility-administered medications for this encounter.   Allergies  Allergen Reactions   Atorvastatin     Muscle weakness and fatigue on 31m daily   Spironolactone     gynecomastia   Metformin And Related Anxiety and Palpitations   Other Anxiety and Palpitations   Social History   Socioeconomic History   Marital status: Married    Spouse name: Not on file   Number of children: 1   Years of education: 8   Highest education level: 8th grade   Occupational History   Not on file  Tobacco Use   Smoking status: Former    Types: Cigarettes    Quit date: 04/08/2019    Years since quitting: 3.3    Passive exposure: Current (wife smokes)   Smokeless tobacco: Never  Vaping Use   Vaping Use: Never used  Substance and Sexual Activity   Alcohol use: No   Drug use: No   Sexual activity: Not Currently  Other Topics Concern   Not on file  Social History Narrative   Lives with wife   Right handed   Caffeine: about 2 cups of coffee every morning, no soda   Social Determinants of Health   Financial Resource Strain: Low Risk  (03/14/2022)   Overall Financial Resource Strain (CARDIA)    Difficulty of Paying Living Expenses: Not very  hard  Food Insecurity: No Food Insecurity (03/14/2022)   Hunger Vital Sign    Worried About Running Out of Food in the Last Year: Never true    Ran Out of Food in the Last Year: Never true  Transportation Needs: No Transportation Needs (07/20/2022)   PRAPARE - Hydrologist (Medical): No    Lack of Transportation (Non-Medical): No  Physical Activity: Sufficiently Active (03/14/2022)   Exercise Vital Sign    Days of Exercise per Week: 7 days    Minutes of Exercise per Session: 60 min  Stress: No Stress Concern Present (03/14/2022)   [place]    Feeling of Stress : Not at all  Social Connections: Moderately Isolated (03/14/2022)   Social Connection and Isolation Panel [NHANES]    Frequency of Communication with Friends and Family: More than three times a week    Frequency of Social Gatherings with Friends and Family: More than three times a week    Attends Religious Services: Never    Marine scientist or Organizations: No    Attends Archivist Meetings: Never    Marital Status: Married  Human resources officer Violence: Not At Risk (03/14/2022)   Humiliation, Afraid, Rape, and Kick questionnaire     Fear of Current or Ex-Partner: No    Emotionally Abused: No    Physically Abused: No    Sexually Abused: No   Family History  Problem Relation Age of Onset   Hypertension Mother    Heart failure Mother    Headache Neg Hx    Migraines Neg Hx    BP 120/80   Pulse 77   Wt 89.4 kg (197 lb)   SpO2 100%   BMI 29.95 kg/m   Wt Readings from Last 3 Encounters:  08/12/22 89.4 kg (197 lb)  07/16/22 90.7 kg (200 lb)  05/25/22 90.7 kg (200 lb)   PHYSICAL EXAM: General:  NAD. No resp difficulty HEENT: Normal Neck: Supple. No JVD. Carotids 2+ bilat; no bruits. No lymphadenopathy or thryomegaly appreciated. Cor: PMI nondisplaced. Regular rate & rhythm. No rubs, gallops or murmurs. Lungs: Clear Abdomen: Soft, nontender, nondistended. No hepatosplenomegaly. No bruits or masses. Good bowel sounds. Extremities: No cyanosis, clubbing, rash, edema Neuro: Alert & oriented x 3, cranial nerves grossly intact. Moves all 4 extremities w/o difficulty. Affect pleasant.  ECG (personally reviewed): NSR 73 bpm  ASSESSMENT & PLAN: 1. Chronic systolic HF - Echo 6/56 CL27-51% - Echo 09/23/19: EF 45-50% - Echo 4/22: EF 55-60%  - NSTEMI in 6/22 -> Echo 6/22 EF 35-40% - Echo  07/27/21 EF 35-40%  - Stable NYHA II. Volume looks good today. - Increase Entresto to 97/103 mg bid. - Decrease Lasix to 20 mg daily. - Continue bisoprolol 5 mg daily. - Continue Jardiance 10 mg daily. - Continue eplerenone 12.5 mg daily. - Labs today. Repeat BMET in 10-14 days. - Repeat echo  2. 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 - Unable to interrogate ICD today. - Labs today.   3. CAD  - s/p acute anterior MI 8/20 with PCI/DES to LAD. 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 - NSTEMI in 6/22 after stopping Brillinta. Stent patent but clot after stent -> s/p thrombectomy and POBA - Continue DAPT and statin indefinitely. - No s/s angina. - Continue Jardiance 10 mg daily. - Last LDL  36. Check lipids today   4. DM2 -  On metformin & Jardiance.  - Per PCP.   5. COPD - Stable. Continue inhalers - No longer smoking  6. HTN - Blood pressure well controlled.  - Med changes as above.  Follow up in 4 months with Dr. Haroldine Laws.  [place], [name] 08/12/22

## 2022-08-12 ENCOUNTER — Encounter (HOSPITAL_COMMUNITY): Payer: Self-pay

## 2022-08-12 ENCOUNTER — Ambulatory Visit (HOSPITAL_COMMUNITY)
Admission: RE | Admit: 2022-08-12 | Discharge: 2022-08-12 | Disposition: A | Discharge status: Home / Self Care | Payer: Medicare HMO | Source: Ambulatory Visit | Attending: Family Medicine | Admitting: Family Medicine

## 2022-08-12 VITALS — BP 120/80 | HR 77 | Wt 197.0 lb

## 2022-08-12 DIAGNOSIS — I10 Essential (primary) hypertension: Secondary | ICD-10-CM

## 2022-08-12 DIAGNOSIS — I5022 Chronic systolic (congestive) heart failure: Secondary | ICD-10-CM | POA: Diagnosis not present

## 2022-08-12 DIAGNOSIS — J449 Chronic obstructive pulmonary disease, unspecified: Secondary | ICD-10-CM | POA: Insufficient documentation

## 2022-08-12 DIAGNOSIS — Z79899 Other long term (current) drug therapy: Secondary | ICD-10-CM | POA: Insufficient documentation

## 2022-08-12 DIAGNOSIS — I2510 Atherosclerotic heart disease of native coronary artery without angina pectoris: Secondary | ICD-10-CM | POA: Insufficient documentation

## 2022-08-12 DIAGNOSIS — Z955 Presence of coronary angioplasty implant and graft: Secondary | ICD-10-CM | POA: Insufficient documentation

## 2022-08-12 DIAGNOSIS — Z95810 Presence of automatic (implantable) cardiac defibrillator: Secondary | ICD-10-CM | POA: Diagnosis not present

## 2022-08-12 DIAGNOSIS — Z8674 Personal history of sudden cardiac arrest: Secondary | ICD-10-CM | POA: Insufficient documentation

## 2022-08-12 DIAGNOSIS — Z87891 Personal history of nicotine dependence: Secondary | ICD-10-CM | POA: Diagnosis not present

## 2022-08-12 DIAGNOSIS — E119 Type 2 diabetes mellitus without complications: Secondary | ICD-10-CM | POA: Insufficient documentation

## 2022-08-12 DIAGNOSIS — I110 Hypertensive heart disease with heart failure: Secondary | ICD-10-CM | POA: Diagnosis not present

## 2022-08-12 DIAGNOSIS — Z7984 Long term (current) use of oral hypoglycemic drugs: Secondary | ICD-10-CM | POA: Insufficient documentation

## 2022-08-12 DIAGNOSIS — I252 Old myocardial infarction: Secondary | ICD-10-CM | POA: Insufficient documentation

## 2022-08-12 LAB — BASIC METABOLIC PANEL
Anion gap: 9 (ref 5–15)
BUN: 19 mg/dL (ref 8–23)
CO2: 27 mmol/L (ref 22–32)
Calcium: 9 mg/dL (ref 8.9–10.3)
Chloride: 104 mmol/L (ref 98–111)
Creatinine, Ser: 1.17 mg/dL (ref 0.61–1.24)
GFR, Estimated: >60 mL/min (ref >60)
Glucose, Bld: 124 mg/dL — ABNORMAL HIGH (ref 70–99)
Potassium: 3.4 mmol/L — ABNORMAL LOW (ref 3.5–5.1)
Sodium: 140 mmol/L (ref 135–145)

## 2022-08-12 LAB — LIPID PANEL
Cholesterol: 213 mg/dL — ABNORMAL HIGH (ref 0–200)
HDL: 38 mg/dL — ABNORMAL LOW (ref >40)
LDL Cholesterol: 161 mg/dL — ABNORMAL HIGH (ref 0–99)
Total CHOL/HDL Ratio: 5.6 RATIO
Triglycerides: 71 mg/dL (ref <150)
VLDL: 14 mg/dL (ref 0–40)

## 2022-08-12 LAB — BRAIN NATRIURETIC PEPTIDE: B Natriuretic Peptide: 192.6 pg/mL — ABNORMAL HIGH (ref 0.0–100.0)

## 2022-08-12 MED ORDER — PRAVASTATIN SODIUM 80 MG PO TABS: 80.0000 mg | ORAL_TABLET | Freq: Every evening | ORAL | 3 refills | Status: DC

## 2022-08-12 MED ORDER — EMPAGLIFLOZIN 10 MG PO TABS: 10.0000 mg | ORAL_TABLET | Freq: Every day | ORAL | 3 refills | Status: DC

## 2022-08-12 MED ORDER — EPLERENONE 25 MG PO TABS: 12.5000 mg | ORAL_TABLET | Freq: Every day | ORAL | 3 refills | Status: DC

## 2022-08-12 MED ORDER — ENTRESTO 97-103 MG PO TABS: 1.0000 | ORAL_TABLET | Freq: Two times a day (BID) | ORAL | 3 refills | Status: DC

## 2022-08-12 MED ORDER — FUROSEMIDE 20 MG PO TABS: 20.0000 mg | ORAL_TABLET | Freq: Every day | ORAL | 3 refills | Status: DC

## 2022-08-12 MED ORDER — EZETIMIBE 10 MG PO TABS: 10.0000 mg | ORAL_TABLET | Freq: Every day | ORAL | 3 refills | Status: DC

## 2022-08-12 MED ORDER — BISOPROLOL FUMARATE 5 MG PO TABS: 5.0000 mg | ORAL_TABLET | Freq: Every day | ORAL | 11 refills | Status: DC

## 2022-08-12 NOTE — Progress Notes (Signed)
Remote ICD transmission.   

## 2022-08-12 NOTE — Patient Instructions (Signed)
INCREASE Entresto to 97/103 mg Twice daily  DECREASE Lasix to 20 mg daily.  Labs done today, your results will be available in MyChart, we will contact you for abnormal readings.  Repeat blood work in 2 weeks  Your physician has requested that you have an echocardiogram. Echocardiography is a painless test that uses sound waves to create images of your heart. It provides your doctor with information about the size and shape of your heart and how well your heart's chambers and valves are working. This procedure takes approximately one hour. There are no restrictions for this procedure. Please do NOT wear cologne, perfume, aftershave, or lotions (deodorant is allowed). Please arrive 15 minutes prior to your appointment time.  Your physician recommends that you schedule a follow-up appointment in: 4 months with an echocardiogram with Dr. Haroldine Laws. ( April 2024)  ** please call the office in February to arrange your follow up appointment **  If you have any questions or concerns before your next appointment please send [place] a message through [place] or call our office at [phone].    TO LEAVE A MESSAGE FOR THE NURSE SELECT OPTION 2, PLEASE LEAVE A MESSAGE INCLUDING: YOUR NAME DATE OF BIRTH CALL BACK NUMBER REASON FOR CALL**this is important as we prioritize the call backs  YOU WILL RECEIVE A CALL BACK THE SAME DAY AS LONG AS YOU CALL BEFORE 4:00 PM  At the [facility], you and your health needs are our priority. As part of our continuing mission to provide you with exceptional heart care, we have created designated Provider Care Teams. These Care Teams include your primary Cardiologist (physician) and Advanced Practice Providers (APPs- Physician Assistants and Nurse Practitioners) who all work together to provide you with the care you need, when you need it.   You may see any of the following providers on your designated Care Team at your next follow up: Dr [name] Dr Loralie Champagne Dr. Roxana Hires, NP Lyda Jester, [place] [facility] [place], [place] Forestine Na, NP Audry Riles, PharmD   Please be sure to bring in all your medications bottles to every appointment.

## 2022-08-16 ENCOUNTER — Telehealth (HOSPITAL_COMMUNITY): Payer: Self-pay | Admitting: Pharmacist

## 2022-08-16 ENCOUNTER — Telehealth (HOSPITAL_COMMUNITY): Payer: Self-pay | Admitting: Surgery

## 2022-08-16 ENCOUNTER — Other Ambulatory Visit (HOSPITAL_COMMUNITY): Payer: Self-pay

## 2022-08-16 DIAGNOSIS — E782 Mixed hyperlipidemia: Secondary | ICD-10-CM

## 2022-08-16 NOTE — Telephone Encounter (Signed)
I called patient to review results and recommendations per provider.  He tells me that he is taking Pravachol and Zetia daily.  I have sent referral to Lipid clinic and he is agreement.

## 2022-08-16 NOTE — Telephone Encounter (Signed)
Advanced Heart Failure Patient Advocate Encounter  Prior Authorization for eplerenone has been approved.    Effective dates: 08/22/21 through 08/22/23  Audry Riles, PharmD, BCPS, BCCP, CPP Heart Failure Clinic Pharmacist [phone]

## 2022-08-16 NOTE — Telephone Encounter (Signed)
-----   Message from Rafael Bihari, [place] sent at 08/12/2022  3:43 PM EST ----- LDL much higher. Is he taking his pravachol + Zetia? If so, refer to lipid clinic.

## 2022-08-17 ENCOUNTER — Ambulatory Visit: Payer: Medicare HMO | Attending: Physician Assistant | Admitting: Physician Assistant

## 2022-08-17 ENCOUNTER — Encounter: Payer: Self-pay | Admitting: Physician Assistant

## 2022-08-17 VITALS — BP 125/73 | HR 62 | Wt 199.8 lb

## 2022-08-17 DIAGNOSIS — Z7984 Long term (current) use of oral hypoglycemic drugs: Secondary | ICD-10-CM | POA: Diagnosis not present

## 2022-08-17 DIAGNOSIS — I10 Essential (primary) hypertension: Secondary | ICD-10-CM | POA: Diagnosis not present

## 2022-08-17 DIAGNOSIS — R0989 Other specified symptoms and signs involving the circulatory and respiratory systems: Secondary | ICD-10-CM

## 2022-08-17 DIAGNOSIS — J069 Acute upper respiratory infection, unspecified: Secondary | ICD-10-CM | POA: Diagnosis not present

## 2022-08-17 DIAGNOSIS — R059 Cough, unspecified: Secondary | ICD-10-CM

## 2022-08-17 DIAGNOSIS — E1165 Type 2 diabetes mellitus with hyperglycemia: Secondary | ICD-10-CM | POA: Diagnosis not present

## 2022-08-17 DIAGNOSIS — J101 Influenza due to other identified influenza virus with other respiratory manifestations: Secondary | ICD-10-CM | POA: Diagnosis not present

## 2022-08-17 DIAGNOSIS — Z09 Encounter for follow-up examination after completed treatment for conditions other than malignant neoplasm: Secondary | ICD-10-CM

## 2022-08-17 DIAGNOSIS — I2510 Atherosclerotic heart disease of native coronary artery without angina pectoris: Secondary | ICD-10-CM | POA: Diagnosis not present

## 2022-08-17 DIAGNOSIS — R067 Sneezing: Secondary | ICD-10-CM | POA: Diagnosis not present

## 2022-08-17 LAB — POCT INFLUENZA A/B
Influenza A, POC: POSITIVE — AB
Influenza B, POC: NEGATIVE

## 2022-08-17 MED ORDER — OSELTAMIVIR PHOSPHATE 75 MG PO CAPS: 75.0000 mg | ORAL_CAPSULE | Freq: Two times a day (BID) | ORAL | 0 refills | Status: DC

## 2022-08-17 MED ORDER — CHLORPHENIRAMINE MALEATE 4 MG PO TABS: ORAL_TABLET | ORAL | 0 refills | Status: DC

## 2022-08-17 MED ORDER — BENZONATATE 100 MG PO CAPS: 200.0000 mg | ORAL_CAPSULE | Freq: Three times a day (TID) | ORAL | 0 refills | Status: DC | PRN

## 2022-08-17 NOTE — Progress Notes (Signed)
Patient ID: Stephen Jennings, male   DOB: [birth year], 66 y.o.   MRN: [id number]     Stephen Jennings, is a 66 y.o. male  [id number]  [id number]  DOB - 11/27/1955  Chief Complaint  Patient presents with   Hospitalization Follow-up       Subjective:   Stephen Jennings is a 66 y.o. male here today for a follow up visit Seen by cardiology 08/12/2022 after ED visit 07/14/2022.  No further CP.  Doing well but 2 days ago started having runny nose, sneezing, and mild cough.  No fever.  No known exposure to illness but was around grandkids over [place].  No body aches but he does feel run down.  Wife on chemo  From cardiology note: Seen in ED 07/14/22 for acute CHF. Advised he increase Lasix x 5 days. Discharged home.   Today he returns for HF follow up. Last seen 12/22. Overall feeling fine. He is not SOB working in his yard or with lifting. Denies palpitations, abnormal bleeding, CP, dizziness, edema, or PND/Orthopnea. Appetite ok. No fever or chills. Weight at home 195 pounds. Taking all medications. No longer smoking.  ASSESSMENT & PLAN: 1. Chronic systolic HF - Echo 4/69 GE95-28% - Echo 09/23/19: EF 45-50% - Echo 4/22: EF 55-60%  - NSTEMI in 6/22 -> Echo 6/22 EF 35-40% - Echo  07/27/21 EF 35-40%  - Stable NYHA II. Volume looks good today. - Increase Entresto to 97/103 mg bid. - Decrease Lasix to 20 mg daily. - Continue bisoprolol 5 mg daily. - Continue Jardiance 10 mg daily. - Continue eplerenone 12.5 mg daily. - Labs today. Repeat BMET in 10-14 days. - Repeat echo   2. 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 - Unable to interrogate ICD today. - Labs today.   3. CAD  - s/p acute anterior MI 8/20 with PCI/DES to LAD. 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 - NSTEMI in 6/22 after stopping Brillinta. Stent patent but clot after stent -> s/p thrombectomy and POBA - Continue DAPT and statin indefinitely. - No s/s angina. - Continue Jardiance 10  mg daily. - Last LDL 36. Check lipids today   4. DM2 - On metformin & Jardiance.  - Per PCP.   5. COPD - Stable. Continue inhalers - No longer smoking   6. HTN - Blood pressure well controlled.  - Med changes as above.     Follow up in 4 months with Dr. Haroldine Laws.   No problems updated.  ALLERGIES: Allergies  Allergen Reactions   Atorvastatin     Muscle weakness and fatigue on 64m daily   Spironolactone     gynecomastia   Metformin And Related Anxiety and Palpitations   Other Anxiety and Palpitations    PAST MEDICAL HISTORY: Past Medical History:  Diagnosis Date   Cardiogenic shock (HCC)    CHF (congestive heart failure) (HCC)    Coronary artery disease    Diabetes mellitus without complication (HHand    Hyperlipidemia    Medical history non-contributory     MEDICATIONS AT HOME: Prior to Admission medications   Medication Sig Start Date End Date Taking? Authorizing Provider  benzonatate (TESSALON) 100 MG capsule Take 2 capsules (200 mg total) by mouth 3 (three) times daily as needed. For cough 08/17/22  Yes Azariyah Luhrs, ADionne Bucy [name]  chlorpheniramine (CHLOR-TRIMETON) 4 MG tablet 1 tab tid prn runny nose 08/17/22  Yes Caren Garske M, [name]  Accu-Chek Softclix Lancets lancets Use to measure  blood sugar twice a day 11/06/19   Ladell Pier, MD  albuterol (PROVENTIL) (2.5 MG/3ML) 0.083% nebulizer solution Take 3 mLs (2.5 mg total) by nebulization every 6 (six) hours as needed for wheezing or shortness of breath. 08/23/21   Blanchie Dessert, MD  albuterol (VENTOLIN HFA) 108 (90 Base) MCG/ACT inhaler INHALE 2 PUFFS BY MOUTH EVERY 6 HOURS AS NEEDED FOR WHEEZING OR  SHORTNESS  OF  BREATH 08/01/22   Hunsucker, Bonna Gains, MD  bisoprolol (ZEBETA) 5 MG tablet Take 1 tablet (5 mg total) by mouth daily. 08/12/22   Rafael Bihari, [name]  Blood Glucose Monitoring Suppl (ACCU-CHEK GUIDE ME) w/Device KIT 1 kit by Does not apply route in the morning and at bedtime. Use to  measure blood sugar twice a day 11/06/19   Ladell Pier, MD  Budeson-Glycopyrrol-Formoterol (BREZTRI AEROSPHERE) 160-9-4.8 MCG/ACT AERO Inhale 2 puffs into the lungs 2 (two) times daily. 01/03/22   Hunsucker, Bonna Gains, MD  co-enzyme Q-10 30 MG capsule Take 1 capsule (30 mg total) by mouth daily. 05/25/21   [place], [name], [name]  empagliflozin (JARDIANCE) 10 MG TABS tablet Take 1 tablet (10 mg total) by mouth daily before breakfast. 08/12/22   [place], [name], [name]  eplerenone (INSPRA) 25 MG tablet Take 0.5 tablets (12.5 mg total) by mouth daily. 08/12/22   Rafael Bihari, [name]  ezetimibe (ZETIA) 10 MG tablet Take 1 tablet (10 mg total) by mouth daily. 08/12/22   [place], [name], [name]  fluticasone (FLONASE) 50 MCG/ACT nasal spray USE 1 SPRAY(S) IN EACH NOSTRIL ONCE DAILY AS NEEDED FOR ALLERGIES OR  RHINITIS 07/19/22   Ladell Pier, MD  furosemide (LASIX) 20 MG tablet Take 1 tablet (20 mg total) by mouth daily. 08/12/22   Rafael Bihari, [name]  glucose blood (ACCU-CHEK GUIDE) test strip Use to measure blood sugar twice a day 11/06/19   Ladell Pier, MD  Hyprom-Naphaz-Polysorb-Zn Sulf (CLEAR EYES COMPLETE) SOLN Apply 1 drop to eye 3 (three) times daily. 05/08/19   [name], MD  loratadine (CLARITIN) 10 MG tablet Take 1 tablet (10 mg total) by mouth daily. 01/03/22   Hunsucker, Bonna Gains, MD  montelukast (SINGULAIR) 10 MG tablet Take 1 tablet (10 mg total) by mouth at bedtime. 01/03/22   Hunsucker, Bonna Gains, MD  nitroGLYCERIN (NITROSTAT) 0.4 MG SL tablet Place 1 tablet (0.4 mg total) under the tongue every 5 (five) minutes x 3 doses as needed for chest pain. 02/06/21   [facility], Tami Lin, PA  omeprazole (PRILOSEC) 20 MG capsule Take 1 capsule by mouth once daily 05/07/21   Ladell Pier, MD  pravastatin (PRAVACHOL) 80 MG tablet Take 1 tablet (80 mg total) by mouth every evening. 08/12/22   [place], [name], [name]  sacubitril-valsartan (ENTRESTO) 97-103 MG Take 1 tablet  by mouth 2 (two) times daily. 08/12/22   [place], [name], [name]  sodium zirconium cyclosilicate (LOKELMA) 5 g packet Take 10 g by mouth daily. 05/06/21   Rafael Bihari, [name]  ticagrelor (BRILINTA) 90 MG TABS tablet Take 1 tablet (90 mg total) by mouth 2 (two) times daily. 06/01/22   Bensimhon, Shaune Pascal, MD  traZODone (DESYREL) 50 MG tablet TAKE 1/2 TO 1 (ONE-HALF TO ONE) TABLET BY MOUTH AT BEDTIME 07/19/22   Ladell Pier, MD    ROS: Neg cardiac Neg GI Neg GU Neg MS Neg psych Neg neuro  Objective:   Vitals:   08/17/22 1432  BP: 125/73  Pulse:  62  SpO2: 95%  Weight: 199 lb 12.8 oz (90.6 kg)   Exam General appearance : Awake, alert, not in any distress. Speech Clear. Not toxic looking HEENT: Atraumatic and Normocephalic.  Throat with PND Neck: Supple, no JVD. No cervical lymphadenopathy.  Chest: Good air entry bilaterally, CTAB.  No rales/rhonchi/wheezing CVS: S1 S2 regular, no murmurs.  Extremities: B/L Lower Ext shows no edema, both legs are warm to touch Neurology: Awake alert, and oriented X 3, CN II-XII intact, Non focal Skin: No Rash  Data Review Lab Results  Component Value Date   HGBA1C 5.9 04/29/2022   HGBA1C 6.7 08/11/2021   HGBA1C 6.4 (H) 02/02/2021    Assessment & Plan   1. Upper respiratory tract infection, unspecified type - flu test+ type A-see #6 - chlorpheniramine (CHLOR-TRIMETON) 4 MG tablet; 1 tab tid prn runny nose  Dispense: 40 tablet; Refill: 0 - benzonatate (TESSALON) 100 MG capsule; Take 2 capsules (200 mg total) by mouth 3 (three) times daily as needed. For cough  Dispense: 40 capsule; Refill: 0  2. Essential hypertension Continue current regimen as given by cardiology  3. Coronary artery disease involving native coronary artery of native heart without angina pectoris Followed by cardiology  4. Hospital discharge follow-up  5. Controlled type 2 diabetes mellitus with hyperglycemia, without long-term current use of insulin  (HCC) On jardiance - Hemoglobin A1c  6.  Flu type A + Tamiflu 67m bid x 5 days.  Have wife call oncologist for tamiflu prophylaxis  Return in about 3 months (around 11/16/2022) for PCP for chronic conditions.  The patient was given clear instructions to go to ER or return to medical center if symptoms don't improve, worsen or new problems develop. The patient verbalized understanding. The patient was told to call to get lab results if they haven't heard anything in the next week.      AFreeman Caldron [name] CFaxton-St[facility] [place]  08/17/2022, 2:48 PM

## 2022-08-24 ENCOUNTER — Ambulatory Visit (INDEPENDENT_AMBULATORY_CARE_PROVIDER_SITE_OTHER): Payer: Medicare HMO

## 2022-08-24 ENCOUNTER — Telehealth: Payer: Self-pay

## 2022-08-24 DIAGNOSIS — I5022 Chronic systolic (congestive) heart failure: Secondary | ICD-10-CM | POA: Diagnosis not present

## 2022-08-24 DIAGNOSIS — Z95810 Presence of automatic (implantable) cardiac defibrillator: Secondary | ICD-10-CM | POA: Diagnosis not present

## 2022-08-24 MED ORDER — EPLERENONE 25 MG PO TABS: 25.0000 mg | ORAL_TABLET | Freq: Every day | ORAL | 3 refills | Status: DC

## 2022-08-24 NOTE — Progress Notes (Signed)
EPIC Encounter for ICM Monitoring  Patient Name: Stephen Jennings is a 67 y.o. male Date: 08/24/2022 Primary Care Physican: Ladell Pier, MD Primary Cardiologist: Kelly/Bensimhon Electrophysiologist: Curt Bears 08/24/2022 Weight: 194 lbs (baseline 194-195)       1st ICM Remote transmission. Spoke with patient and agreeable to ICM monthly follow up.  Heart Failure questions reviewed.  He currently has the flu and is SOB.  He denies weight gain or lower extremity swelling.    CorVue thoracic impedance suggesting possible fluid accumulation starting 08/22/22.   Prescribed:  Furosemide 20 mg take 1 tablet(s) (20 mg total) by mouth daily.  Confirmed on 08/24/2022 he is taking Lasix 20 mg daily Eplerenone (inspra) 25 mg take 0.5 tablet(s) (12.5 mg total) by mouth daily  Labs: 08/29/2022 BMET Scheduled 08/12/2022 Creatinine 1.17, BUN 19, Potassium 3.4, Sodium 140, GFR >60 07/16/2022 Creatinine 0.86, BUN 11, Potassium 3.3, Sodium 141, GFR >60  04/29/2022 Creatinine 1.15, BUN 17, Potassium 4.6, Sodium 143, GFR 71  A complete set of results can be found in Results Review.  Recommendations: Sent to Allena Katz NP at [facility] clinic for review and recommendations.  Follow-up plan: ICM clinic phone appointment on 08/31/2022 to recheck fluid levels.   91 day device clinic remote transmission 10/15/2022.    EP/Cardiology Office Visits: 09/13/2022 with Dr. Claiborne Billings.   Recall 03/27/2020 with Dr Curt Bears (only 1 visit on 07/16/2019 following 03/2019 device implant)  Copy of ICM check sent to Dr. Curt Bears.   3 month ICM trend: 08/24/2022.    12-14 Month ICM trend:     [name], RN 08/24/2022 1:03 PM

## 2022-08-24 NOTE — Telephone Encounter (Signed)
-----   Message from Rafael Bihari, [place] sent at 08/12/2022 11:24 AM EST ----- Stephen Jennings,  Can you send a transmission in 7-10 days please? Also, can you enroll in monthly fluid monitoring? Thanks! J

## 2022-08-24 NOTE — Progress Notes (Signed)
  Received: Today [place], [name], [name]  Decie Verne Panda, RN Thanks Margarita Grizzle,  He can increase his Inspra to 25 mg daily with a repeat BMET in 1 week.

## 2022-08-24 NOTE — Telephone Encounter (Signed)
Referred to ICM clinic by Allena Katz, NP at HF clinic.   Spoke with patient and ICM intro given.  Patient is agreeable to ICM monthly follow up. 1st ICM remote transmission scheduled for 08/24/2022.

## 2022-08-24 NOTE — Progress Notes (Signed)
Spoke with patient and advised Allena Katz, NP recommended to increase Inspra to 25 mg daily.  He has labs scheduled 08/29/2022.  He verbalized understanding and agreed to plan.  Escript updated and sent to preferred pharmacy.

## 2022-08-29 ENCOUNTER — Encounter: Payer: Self-pay | Admitting: Internal Medicine

## 2022-08-29 ENCOUNTER — Telehealth (HOSPITAL_COMMUNITY): Payer: Self-pay

## 2022-08-29 ENCOUNTER — Ambulatory Visit: Payer: Medicare HMO | Attending: Internal Medicine | Admitting: Internal Medicine

## 2022-08-29 ENCOUNTER — Other Ambulatory Visit (HOSPITAL_COMMUNITY): Payer: Self-pay

## 2022-08-29 ENCOUNTER — Ambulatory Visit (HOSPITAL_COMMUNITY)
Admission: RE | Admit: 2022-08-29 | Discharge: 2022-08-29 | Disposition: A | Discharge status: Home / Self Care | Payer: Medicare HMO | Source: Ambulatory Visit | Attending: Cardiology | Admitting: Cardiology

## 2022-08-29 VITALS — BP 130/75 | HR 79 | Temp 98.0°F | Ht 68.0 in | Wt 201.0 lb

## 2022-08-29 DIAGNOSIS — I5022 Chronic systolic (congestive) heart failure: Secondary | ICD-10-CM

## 2022-08-29 DIAGNOSIS — J439 Emphysema, unspecified: Secondary | ICD-10-CM | POA: Diagnosis not present

## 2022-08-29 DIAGNOSIS — I2510 Atherosclerotic heart disease of native coronary artery without angina pectoris: Secondary | ICD-10-CM

## 2022-08-29 DIAGNOSIS — Z2821 Immunization not carried out because of patient refusal: Secondary | ICD-10-CM | POA: Diagnosis not present

## 2022-08-29 DIAGNOSIS — E1169 Type 2 diabetes mellitus with other specified complication: Secondary | ICD-10-CM | POA: Diagnosis not present

## 2022-08-29 DIAGNOSIS — Z1211 Encounter for screening for malignant neoplasm of colon: Secondary | ICD-10-CM

## 2022-08-29 DIAGNOSIS — E669 Obesity, unspecified: Secondary | ICD-10-CM | POA: Diagnosis not present

## 2022-08-29 DIAGNOSIS — E1165 Type 2 diabetes mellitus with hyperglycemia: Secondary | ICD-10-CM | POA: Insufficient documentation

## 2022-08-29 LAB — BASIC METABOLIC PANEL
Anion gap: 8 (ref 5–15)
BUN: 14 mg/dL (ref 8–23)
CO2: 31 mmol/L (ref 22–32)
Calcium: 8.9 mg/dL (ref 8.9–10.3)
Chloride: 102 mmol/L (ref 98–111)
Creatinine, Ser: 1.01 mg/dL (ref 0.61–1.24)
GFR, Estimated: >60 mL/min (ref >60)
Glucose, Bld: 126 mg/dL — ABNORMAL HIGH (ref 70–99)
Potassium: 3.2 mmol/L — ABNORMAL LOW (ref 3.5–5.1)
Sodium: 141 mmol/L (ref 135–145)

## 2022-08-29 NOTE — Progress Notes (Signed)
Patient ID: Stephen Jennings, male    DOB: [birth year]  MRN: [id number]  CC: Follow-up (Coronary artery disease f/u.  Med refill. /No to flu vax. )   Subjective: Stephen Jennings is a 67 y.o. male who presents for chronic ds management His concerns today include:  atient with history of CAD(NSTEMI with stent to LAD 03/2019), ICM with last EF 35-40% 07/2021, ICD present,  former smoker, COPD, pre-DM 2, anemia (resolved on labs 10/21).    DM: Lab Results  Component Value Date   HGBA1C 5.9 04/29/2022  I was previously documenting as prediabetes.  However patient does in fact have diabetes with an A1c of 6.7 documented 08/11/2021.  He is on Jardiance and tolerating the medicine.  Checks blood sugars once in the mornings and sometimes at nights.  His range has been 115-120.  He feels he does okay with his eating habits.  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  Weight in July of last year was 207. Reports having had his last eye exam in June 2023 with Dr. Zenia Resides office.  COPD: He is on Breztri inhaler and doing well with that.  He uses the albuterol inhaler once a day.  No recent use of nebulizer.  Followed by Dr. Silas Flood. Denies any chronic cough or worsening shortness of breath at this time.  HTN/CAD/chronic systolic CHF: Reports having had a flare of CHF several weeks ago.  Cardiology has adjusted his medications.  Entresto increased to 97/103 mg twice a day, Lasix decreased to 20 mg daily.  He was continued on bisoprolol 5 mg daily Jardiance 10 mg daily a pill [place]andon 12.5 mg daily, Zetia and Pravachol.  Last LDL cholesterol was 36. Denies any chest pains, increased shortness of breath or lower extremity edema at this time.  No PND orthopnea.  HM: He declines tetanus vaccine, RSV and COVID booster.  Cologuard was ordered on last visit with me in September.  He did receive the kit.  He promised to use it and send it back in.  Patient Active Problem List   Diagnosis Date Noted    Controlled type 2 diabetes mellitus with hyperglycemia, without long-term current use of insulin ([place]) 08/29/2022   Hyperlipidemia 02/06/2021   Elevated troponin    NSTEMI (non-ST elevated myocardial infarction) ([place]) 02/01/2021   Non-seasonal allergic rhinitis 01/20/2021   Pulmonary emphysema ([place]) 12/03/2020   SOB (shortness of breath) 12/02/2020   History of tobacco abuse 32/67/1245   Chronic systolic heart failure ([place]) 10/05/2020   Coronary artery disease of native artery of native heart with stable angina pectoris ([place]) 10/05/2020   Gastroesophageal reflux disease without esophagitis 10/05/2020   Prediabetes 10/05/2020   Essential hypertension 06/08/2020   Obesity (BMI 30.0-34.9) 06/08/2020   Snoring 11/28/2019   Morning headache 11/28/2019   Chronic daily headache 10/08/2019   Coronary artery disease involving left main coronary artery 05/08/2019   History of placement of stent in LAD coronary artery 05/08/2019   Diabetes mellitus type II, controlled ([place]) 05/08/2019     Current Outpatient Medications on File Prior to Visit  Medication Sig Dispense Refill   Accu-Chek Softclix Lancets lancets Use to measure blood sugar twice a day 100 each 6   albuterol (PROVENTIL) (2.5 MG/3ML) 0.083% nebulizer solution Take 3 mLs (2.5 mg total) by nebulization every 6 (six) hours as needed for wheezing or shortness of breath. 75 mL 12   albuterol (VENTOLIN HFA) 108 (90 Base) MCG/ACT inhaler INHALE 2 PUFFS  BY MOUTH EVERY 6 HOURS AS NEEDED FOR WHEEZING OR  SHORTNESS  OF  BREATH 18 g 4   benzonatate (TESSALON) 100 MG capsule Take 2 capsules (200 mg total) by mouth 3 (three) times daily as needed. For cough 40 capsule 0   bisoprolol (ZEBETA) 5 MG tablet Take 1 tablet (5 mg total) by mouth daily. 30 tablet 11   Blood Glucose Monitoring Suppl (ACCU-CHEK GUIDE ME) w/Device KIT 1 kit by Does not apply route in the morning and at bedtime. Use to measure blood sugar twice a day 1 kit 0    Budeson-Glycopyrrol-Formoterol (BREZTRI AEROSPHERE) 160-9-4.8 MCG/ACT AERO Inhale 2 puffs into the lungs 2 (two) times daily. 10.7 g 11   chlorpheniramine (CHLOR-TRIMETON) 4 MG tablet 1 tab tid prn runny nose 40 tablet 0   co-enzyme Q-10 30 MG capsule Take 1 capsule (30 mg total) by mouth daily. 90 capsule 3   empagliflozin (JARDIANCE) 10 MG TABS tablet Take 1 tablet (10 mg total) by mouth daily before breakfast. 90 tablet 3   eplerenone (INSPRA) 25 MG tablet Take 1 tablet (25 mg total) by mouth daily. 90 tablet 3   ezetimibe (ZETIA) 10 MG tablet Take 1 tablet (10 mg total) by mouth daily. 90 tablet 3   fluticasone (FLONASE) 50 MCG/ACT nasal spray USE 1 SPRAY(S) IN EACH NOSTRIL ONCE DAILY AS NEEDED FOR ALLERGIES OR  RHINITIS 48 g 0   furosemide (LASIX) 20 MG tablet Take 1 tablet (20 mg total) by mouth daily. 90 tablet 3   glucose blood (ACCU-CHEK GUIDE) test strip Use to measure blood sugar twice a day 100 each 6   Hyprom-Naphaz-Polysorb-Zn Sulf (CLEAR EYES COMPLETE) SOLN Apply 1 drop to eye 3 (three) times daily. 15 mL 1   loratadine (CLARITIN) 10 MG tablet Take 1 tablet (10 mg total) by mouth daily. 30 tablet 11   montelukast (SINGULAIR) 10 MG tablet Take 1 tablet (10 mg total) by mouth at bedtime. 30 tablet 11   nitroGLYCERIN (NITROSTAT) 0.4 MG SL tablet Place 1 tablet (0.4 mg total) under the tongue every 5 (five) minutes x 3 doses as needed for chest pain. 25 tablet 3   omeprazole (PRILOSEC) 20 MG capsule Take 1 capsule by mouth once daily 30 capsule 11   pravastatin (PRAVACHOL) 80 MG tablet Take 1 tablet (80 mg total) by mouth every evening. 90 tablet 3   sacubitril-valsartan (ENTRESTO) 97-103 MG Take 1 tablet by mouth 2 (two) times daily. 180 tablet 3   sodium zirconium cyclosilicate (LOKELMA) 5 g packet Take 10 g by mouth daily. 30 each 11   ticagrelor (BRILINTA) 90 MG TABS tablet Take 1 tablet (90 mg total) by mouth 2 (two) times daily. 180 tablet 1   traZODone (DESYREL) 50 MG tablet TAKE  1/2 TO 1 (ONE-HALF TO ONE) TABLET BY MOUTH AT BEDTIME 90 tablet 0   No current facility-administered medications on file prior to visit.    Allergies  Allergen Reactions   Atorvastatin     Muscle weakness and fatigue on '80mg'$  daily   Spironolactone     gynecomastia   Metformin And Related Anxiety and Palpitations   Other Anxiety and Palpitations    Social History   Socioeconomic History   Marital status: Married    Spouse name: Not on file   Number of children: 1   Years of education: 8   Highest education level: 8th grade  Occupational History   Not on file  Tobacco Use   Smoking status:  Former    Types: Cigarettes    Quit date: 04/08/2019    Years since quitting: 3.3    Passive exposure: Current (wife smokes)   Smokeless tobacco: Never  Vaping Use   Vaping Use: Never used  Substance and Sexual Activity   Alcohol use: No   Drug use: No   Sexual activity: Not Currently  Other Topics Concern   Not on file  Social History Narrative   Lives with wife   Right handed   Caffeine: about 2 cups of coffee every morning, no soda   Social Determinants of Health   Financial Resource Strain: Low Risk  (03/14/2022)   Overall Financial Resource Strain (CARDIA)    Difficulty of Paying Living Expenses: Not very hard  Food Insecurity: No Food Insecurity (03/14/2022)   Hunger Vital Sign    Worried About Running Out of Food in the Last Year: Never true    Ran Out of Food in the Last Year: Never true  Transportation Needs: No Transportation Needs (07/20/2022)   PRAPARE - Hydrologist (Medical): No    Lack of Transportation (Non-Medical): No  Physical Activity: Sufficiently Active (03/14/2022)   Exercise Vital Sign    Days of Exercise per Week: 7 days    Minutes of Exercise per Session: 60 min  Stress: No Stress Concern Present (03/14/2022)   [place]    Feeling of Stress : Not at all   Social Connections: Moderately Isolated (03/14/2022)   Social Connection and Isolation Panel [NHANES]    Frequency of Communication with Friends and Family: More than three times a week    Frequency of Social Gatherings with Friends and Family: More than three times a week    Attends Religious Services: Never    Marine scientist or Organizations: No    Attends Archivist Meetings: Never    Marital Status: Married  Human resources officer Violence: Not At Risk (03/14/2022)   Humiliation, Afraid, Rape, and Kick questionnaire    Fear of Current or Ex-Partner: No    Emotionally Abused: No    Physically Abused: No    Sexually Abused: No    Family History  Problem Relation Age of Onset   Hypertension Mother    Heart failure Mother    Headache Neg Hx    Migraines Neg Hx     Past Surgical History:  Procedure Laterality Date   CARDIAC CATHETERIZATION     CORONARY BALLOON ANGIOPLASTY N/A 02/04/2021   Procedure: CORONARY BALLOON ANGIOPLASTY;  Surgeon: Troy Sine, MD;  Location: [facility];  Service: Cardiovascular;  Laterality: N/A;   CORONARY THROMBECTOMY N/A 02/04/2021   Procedure: Coronary Thrombectomy;  Surgeon: Troy Sine, MD;  Location: [facility];  Service: Cardiovascular;  Laterality: N/A;   CORONARY/GRAFT ACUTE MI REVASCULARIZATION N/A 03/31/2019   Procedure: Coronary/Graft Acute MI Revascularization;  Surgeon: Troy Sine, MD;  Location: [facility];  Service: Cardiovascular;  Laterality: N/A;   ICD IMPLANT N/A 04/15/2019   Procedure: ICD IMPLANT;  Surgeon: Constance Haw, MD;  Location: [facility];  Service: Cardiovascular;  Laterality: N/A;   INTRAVASCULAR ULTRASOUND/IVUS N/A 02/04/2021   Procedure: Intravascular Ultrasound/IVUS;  Surgeon: Troy Sine, MD;  Location: [facility];  Service: Cardiovascular;  Laterality: N/A;   LEFT HEART CATH AND CORONARY ANGIOGRAPHY N/A 03/31/2019   Procedure: LEFT HEART CATH AND  CORONARY ANGIOGRAPHY;  Surgeon: Troy Sine, MD;  Location: [facility];  Service: Cardiovascular;  Laterality: N/A;   LEFT HEART CATH AND CORONARY ANGIOGRAPHY N/A 02/02/2021   Procedure: LEFT HEART CATH AND CORONARY ANGIOGRAPHY;  Surgeon: Troy Sine, MD;  Location: [facility];  Service: Cardiovascular;  Laterality: N/A;   LEFT HEART CATH AND CORONARY ANGIOGRAPHY N/A 02/04/2021   Procedure: LEFT HEART CATH AND CORONARY ANGIOGRAPHY;  Surgeon: Troy Sine, MD;  Location: [facility];  Service: Cardiovascular;  Laterality: N/A;   NO PAST SURGERIES     RIGHT HEART CATH N/A 03/31/2019   Procedure: RIGHT HEART CATH;  Surgeon: Troy Sine, MD;  Location: [facility];  Service: Cardiovascular;  Laterality: N/A;   VENTRICULAR ASSIST DEVICE INSERTION N/A 03/31/2019   Procedure: VENTRICULAR ASSIST DEVICE INSERTION;  Surgeon: Troy Sine, MD;  Location: [facility];  Service: Cardiovascular;  Laterality: N/A;    ROS: Review of Systems Negative except as stated above  PHYSICAL EXAM: BP 130/75 (BP Location: Left Arm, Patient Position: Sitting, Cuff Size: Normal)   Pulse 79   Temp 98 F (36.7 C) (Oral)   Ht '5\' 8"'$  (1.727 m)   Wt 201 lb (91.2 kg)   SpO2 98%   BMI 30.56 kg/m   Wt Readings from Last 3 Encounters:  08/29/22 201 lb (91.2 kg)  08/17/22 199 lb 12.8 oz (90.6 kg)  08/12/22 197 lb (89.4 kg)    Physical Exam   General appearance - alert, well appearing, and in no distress Mental status - normal mood, behavior, speech, dress, motor activity, and thought processes Chest -breath sounds moderately decreased bilaterally.  No crackles or wheezes heard. Heart - normal rate, regular rhythm, normal S1, S2, no murmurs, rubs, clicks or gallops Extremities -no lower extremity edema. Diabetic Foot Exam - Simple   Simple Foot Form Visual Inspection See comments: Yes Sensation Testing Intact to touch and monofilament testing bilaterally:  Yes Pulse Check Posterior Tibialis and Dorsalis pulse intact bilaterally: Yes Comments Toenails slightly overgrown.         Latest Ref Rng & Units 08/29/2022   10:01 AM 08/12/2022   10:56 AM 07/16/2022   10:40 PM  CMP  Glucose 70 - 99 mg/dL 126  124  105   BUN 8 - 23 mg/dL '14  19  11   '$ Creatinine 0.61 - 1.24 mg/dL 1.01  1.17  0.86   Sodium 135 - 145 mmol/L 141  140  141   Potassium 3.5 - 5.1 mmol/L 3.2  3.4  3.3   Chloride 98 - 111 mmol/L 102  104  103   CO2 22 - 32 mmol/L '31  27  24   '$ Calcium 8.9 - 10.3 mg/dL 8.9  9.0  9.5    Lipid Panel     Component Value Date/Time   CHOL 213 (H) 08/12/2022 1056   CHOL 117 11/16/2020 0912   TRIG 71 08/12/2022 1056   HDL 38 (L) 08/12/2022 1056   HDL 37 (L) 11/16/2020 0912   CHOLHDL 5.6 08/12/2022 1056   VLDL 14 08/12/2022 1056   LDLCALC 161 (H) 08/12/2022 1056   LDLCALC 66 11/16/2020 0912    CBC    Component Value Date/Time   WBC 9.1 07/16/2022 2240   RBC 4.16 (L) 07/16/2022 2240   HGB 12.4 (L) 07/16/2022 2240   HGB 13.1 04/23/2021 1025   HCT 37.3 (L) 07/16/2022 2240   HCT 39.9 04/23/2021 1025  PLT 185 07/16/2022 2240   PLT 258 04/23/2021 1025   MCV 89.7 07/16/2022 2240   MCV 90 04/23/2021 1025   MCH 29.8 07/16/2022 2240   MCHC 33.2 07/16/2022 2240   RDW 14.5 07/16/2022 2240   RDW 14.1 04/23/2021 1025   LYMPHSABS 0.9 07/16/2022 2240   MONOABS 0.9 07/16/2022 2240   EOSABS 0.1 07/16/2022 2240   BASOSABS 0.0 07/16/2022 2240    ASSESSMENT AND PLAN: 1. Type 2 diabetes mellitus with obesity ([place]) Reported blood sugars are at goal.  Continue Jardiance. Patient advised to eliminate sugary drinks from the diet, cut back on portion sizes especially of white carbohydrates, eat more white lean meat like chicken [place] and seafood instead of beef or pork and incorporate fresh fruits and vegetables into the diet daily.  - Microalbumin / creatinine urine ratio  2. Pulmonary emphysema, unspecified emphysema type ([place]) Stable on  Breztri and albuterol.  3. Coronary artery disease involving native coronary artery of native heart without angina pectoris Stable.  Continue bisoprolol, Pravachol, Zetia and Brilinta  4. CHF (congestive heart failure), NYHA class II, chronic, systolic (H[facility]) Stable and compensated.  Continue Entresto, Jardiance, bisoprolol  5. Screening for colon cancer Encourage patient to use the Cologuard test and turn it in for colon cancer screening as soon as possible.  6. Tetanus, diphtheria, and acellular pertussis (Tdap) vaccination declined Recommended.  Patient declined.  He also declined RSV and COVID 19 booster.   Patient was given the opportunity to ask questions.  Patient verbalized understanding of the plan and was able to repeat key elements of the plan.   This documentation was completed using Radio producer.  Any transcriptional errors are unintentional.  Orders Placed This Encounter  Procedures   Microalbumin / creatinine urine ratio     Requested Prescriptions    No prescriptions requested or ordered in this encounter    Return in about 4 months (around 12/28/2022).  Karle Plumber, MD, FACP

## 2022-08-29 NOTE — Telephone Encounter (Signed)
Patient aware and agreeable. He understands medication changes. He cannot do labs until his visit with Dr. Claiborne Billings, I put in orders.

## 2022-08-30 LAB — MICROALBUMIN / CREATININE URINE RATIO
Creatinine, Urine: 15.3 mg/dL
Microalb/Creat Ratio: <20 mg/g creat (ref 0–29)
Microalbumin, Urine: <3 ug/mL

## 2022-08-31 ENCOUNTER — Telehealth: Payer: Self-pay

## 2022-08-31 ENCOUNTER — Ambulatory Visit (INDEPENDENT_AMBULATORY_CARE_PROVIDER_SITE_OTHER): Payer: Medicare HMO

## 2022-08-31 DIAGNOSIS — Z95810 Presence of automatic (implantable) cardiac defibrillator: Secondary | ICD-10-CM

## 2022-08-31 DIAGNOSIS — I5022 Chronic systolic (congestive) heart failure: Secondary | ICD-10-CM

## 2022-08-31 MED ORDER — FUROSEMIDE 20 MG PO TABS: 40.0000 mg | ORAL_TABLET | Freq: Every day | ORAL | 3 refills | Status: DC

## 2022-08-31 NOTE — Telephone Encounter (Signed)
Remote ICM transmission received.  Attempted call to patient regarding ICM remote transmission and left message to return call   

## 2022-08-31 NOTE — Progress Notes (Signed)
Spoke with patient and advised per Allena Katz NP to increase Furosemide 20 mg to 2 tablets (40 mg total) daily.  Advised labs will be drawn at Dr [facility] appointment (order placed by HF clinic on 1/8).  He verbalized understanding and repeated instructions back correctly.  He will call back for any changes in condition.  He does not need a Lasix refill at this time.

## 2022-08-31 NOTE — Progress Notes (Signed)
Spoke with patient and heart failure questions reviewed.  Transmission results reviewed.  Pt reports wheezing and 6 pound weight increase in the last week (baseline 194-195 lbs).  Weight on 1/9 was 200 lbs.   Confirmed with patient he is taking   Confirmed the following: Furosemide 20 mg take 1 tablet(s) (20 mg total) by mouth daily. Eplerenone (Inspra) 25 mg take 1 tablet (25 mg total) by mouth daily  (Increased to 1 tablet daily on 1/3)  Copy sent to Allena Katz, NP at [facility] clinic for review and recommendations if needed.   Janett Billow, just to clarify a couple of things:  08/29/2022 Lab note says to increase Inspra to 25 mg daily (which you prescribed the change on 1/3 because Lasix was increased to 20 mg daily).  I confirmed he made that change on 1/3 so he had been taking the 25 mg daily when 1/8 BMET was drawn.    08/29/2022 Lab note says to decrease Lokelma to 5 g daily.  Confirmed patient decreased dosage as instructed on 1/8 but the dosage was not changed in EPIC.    Please advise. Thank you.

## 2022-08-31 NOTE — Progress Notes (Signed)
EPIC Encounter for ICM Monitoring  Patient Name: Stephen Jennings is a 67 y.o. male Date: 08/31/2022 Primary Care Physican: Ladell Pier, MD Primary Cardiologist: Bensimhon Electrophysiologist: Curt Bears 08/24/2022 Weight: 194 lbs (baseline 194-195)   AT/AF Burden   <1%       Attempted call to patient and unable to reach.  Left message to return call. Transmission reviewed.    CorVue thoracic impedance suggesting possible fluid accumulation starting 08/22/22.   Prescribed:  Furosemide 20 mg take 1 tablet(s) (20 mg total) by mouth daily. Eplerenone (Inspra) 25 mg take 1 tablet (25 mg total) by mouth daily  (Increased to 1 tablet daily on 1/3)  Labs: Next BMET scheduled at [facility] with Dr Claiborne Billings on 1/23 (pt unable to do labs until appointment per 1/8 lab note).  08/29/2022 Creatinine 1.01, BUN 14, Potassium 3.2, Sodium 141, GFR >60  08/12/2022 Creatinine 1.17, BUN 19, Potassium 3.4, Sodium 140, GFR >60 07/16/2022 Creatinine 0.86, BUN 11, Potassium 3.3, Sodium 141, GFR >60  04/29/2022 Creatinine 1.15, BUN 17, Potassium 4.6, Sodium 143 A complete set of results can be found in Results Review.  Recommendations: Unable to reach.  Will send copy to Allena Katz, NP at [facility] clinic for review if patient is reached.  Inspra increased by Janett Billow to 1 tablet daily 1/3 per 1/3 ICM note.  Currently not prescribed Potassium.  Follow-up plan: ICM clinic phone appointment on 09/07/2022 to recheck fluid levels.   91 day device clinic remote transmission 10/19/2022.    EP/Cardiology Office Visits: 09/13/2022 with Dr. Claiborne Billings.    Copy of ICM check sent to Dr. Curt Bears.   3 month ICM trend: 08/31/2022.    12-14 Month ICM trend:     [name], RN 08/31/2022 7:14 AM

## 2022-08-31 NOTE — Progress Notes (Signed)
  Received: Today [place], [name], [name]  Daekwon Beswick Panda, RN He can increase his Lasix to 40 mg daily. We will repeat labs at Dr. Claiborne Billings appt.

## 2022-09-03 NOTE — Progress Notes (Unsigned)
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 male patient of Dr's Claiborne Billings and Bensimhon in the office today to talk about options for lowering cholesterol.   He complains today of ongoing chest pain that started with addition of Entresto.  His dose was recently increased and the pain continues, lasts about 3-4 hours after each dose.   Previously failed both atorvastatin and rosuvastatin, but tolerated pravastatin and ezetimibe.  Insurance Carrier: Medicare/Medicaid - [employer]   LDL Cholesterol goal:  LDL < 55  Current Medications:   ezetimibe  Previously tried:  atorvastatin , rosuvastatin - myalgias  Family Hx: Mother with MI at 10; don't know father history; siblings healthy; son 54 healthy  Social Hx: Tobacco: quit 2020 Alcohol:   no  Diet: Mix of home/out; some deep fried chicken weekly; vegetables canned; snacks on Nabs      Exercise:  no specific exercise    Lab Results  Component Value Date   CHOL 213 (H) 08/12/2022   HDL 38 (L) 08/12/2022   LDLCALC 161 (H) 08/12/2022   TRIG 71 08/12/2022   CHOLHDL 5.6 08/12/2022    Lab Results  Component Value Date   ALT 29 04/29/2022   AST 28 04/29/2022   ALKPHOS 104 04/29/2022   BILITOT 0.6 04/29/2022   Lab Results  Component Value Date   CREATININE 1.01 08/29/2022   BUN 14  08/29/2022   NA 141 08/29/2022   K 3.2 (L) 08/29/2022   CL 102 08/29/2022   CO2 31 08/29/2022   Lab Results  Component Value Date   HGBA1C 5.9 04/29/2022    Home Medications    Current Outpatient Medications  Medication Sig Dispense Refill   Accu-Chek Softclix Lancets lancets Use to measure blood sugar twice a day 100 each 6   albuterol (PROVENTIL) (2.5 MG/3ML) 0.083% nebulizer solution Take 3 mLs (2.5 mg total) by nebulization every 6 (six) hours as needed for wheezing or shortness of breath. 75 mL 12   albuterol (VENTOLIN HFA) 108 (90 Base) MCG/ACT inhaler INHALE 2 PUFFS BY MOUTH EVERY 6 HOURS AS NEEDED FOR WHEEZING OR  SHORTNESS  OF  BREATH 18 g 4   benzonatate (TESSALON) 100 MG capsule Take 2 capsules (200 mg total) by mouth 3 (three) times daily as needed. For cough 40 capsule 0   bisoprolol (ZEBETA) 5 MG tablet Take 1 tablet (5 mg total) by mouth daily. 30 tablet 11   Blood Glucose Monitoring Suppl (ACCU-CHEK GUIDE ME) w/Device KIT 1 kit by Does not apply route in the morning and at bedtime. Use to measure blood sugar twice a day 1 kit 0   Budeson-Glycopyrrol-Formoterol (BREZTRI AEROSPHERE) 160-9-4.8 MCG/ACT AERO Inhale 2 puffs into the lungs 2 (two)  times daily. 10.7 g 11   chlorpheniramine (CHLOR-TRIMETON) 4 MG tablet 1 tab tid prn runny nose 40 tablet 0   co-enzyme Q-10 30 MG capsule Take 1 capsule (30 mg total) by mouth daily. 90 capsule 3   empagliflozin (JARDIANCE) 10 MG TABS tablet Take 1 tablet (10 mg total) by mouth daily before breakfast. 90 tablet 3   eplerenone (INSPRA) 25 MG tablet Take 1 tablet (25 mg total) by mouth daily. 90 tablet 3   ezetimibe (ZETIA) 10 MG tablet Take 1 tablet (10 mg total) by mouth daily. 90 tablet 3   fluticasone (FLONASE) 50 MCG/ACT nasal spray USE 1 SPRAY(S) IN EACH NOSTRIL ONCE DAILY AS NEEDED FOR ALLERGIES OR  RHINITIS 48 g 0   furosemide (LASIX) 20 MG tablet Take 2 tablets (40 mg total) by mouth daily. 180 tablet 3   glucose blood  (ACCU-CHEK GUIDE) test strip Use to measure blood sugar twice a day 100 each 6   Hyprom-Naphaz-Polysorb-Zn Sulf (CLEAR EYES COMPLETE) SOLN Apply 1 drop to eye 3 (three) times daily. 15 mL 1   loratadine (CLARITIN) 10 MG tablet Take 1 tablet (10 mg total) by mouth daily. 30 tablet 11   montelukast (SINGULAIR) 10 MG tablet Take 1 tablet (10 mg total) by mouth at bedtime. 30 tablet 11   nitroGLYCERIN (NITROSTAT) 0.4 MG SL tablet Place 1 tablet (0.4 mg total) under the tongue every 5 (five) minutes x 3 doses as needed for chest pain. 25 tablet 3   omeprazole (PRILOSEC) 20 MG capsule Take 1 capsule by mouth once daily 30 capsule 11   pravastatin (PRAVACHOL) 80 MG tablet Take 1 tablet (80 mg total) by mouth every evening. 90 tablet 3   sacubitril-valsartan (ENTRESTO) 97-103 MG Take 1 tablet by mouth 2 (two) times daily. 180 tablet 3   sodium zirconium cyclosilicate (LOKELMA) 5 g packet Take 10 g by mouth daily. 30 each 11   ticagrelor (BRILINTA) 90 MG TABS tablet Take 1 tablet (90 mg total) by mouth 2 (two) times daily. 180 tablet 1   traZODone (DESYREL) 50 MG tablet TAKE 1/2 TO 1 (ONE-HALF TO ONE) TABLET BY MOUTH AT BEDTIME 90 tablet 0   No current facility-administered medications for this visit.     Assessment & Plan    Hyperlipidemia Assessment: Patient with ASCVD not at LDL goal of < 55 Most recent LDL 161 on 08/12/22 Has been compliant with moderate intensity statin/ezetimibe : pravastatin 80, ezetimibe 10 Not able to tolerate high intensity statins secondary to myalgias: atorvastatin, rosuvastatin Reviewed options for lowering LDL cholesterol, including ezetimibe, PCSK-9 inhibitors, bempedoic acid and inclisiran.  Discussed mechanisms of action, dosing, side effects, potential decreases in LDL cholesterol and costs.  Also reviewed potential options for patient assistance.  Plan: Patient agreeable to starting Leqvio Repeat labs after:  1 month after second injection Lipid Liver  function Patient signed benefits investigation for Leqvio    Chronic systolic heart failure [facility]) Advised patient to reach out to HF clinic about ongoing chest pain issues related to Entresto dose.    Tommy Medal, PharmD CPP [facility] [address] [place]  [place],  [id number] [phone]  09/05/2022, 10:44 AM

## 2022-09-05 ENCOUNTER — Ambulatory Visit: Payer: Medicare HMO | Attending: Cardiology | Admitting: Pharmacist Clinician (PhC)/ Clinical Pharmacy Specialist

## 2022-09-05 ENCOUNTER — Encounter: Payer: Self-pay | Admitting: Pharmacist Clinician (PhC)/ Clinical Pharmacy Specialist

## 2022-09-05 VITALS — Ht 69.0 in | Wt 201.0 lb

## 2022-09-05 DIAGNOSIS — I5022 Chronic systolic (congestive) heart failure: Secondary | ICD-10-CM

## 2022-09-05 DIAGNOSIS — E7849 Other hyperlipidemia: Secondary | ICD-10-CM

## 2022-09-05 MED ORDER — PRAVASTATIN SODIUM 80 MG PO TABS: 80.0000 mg | ORAL_TABLET | Freq: Every evening | ORAL | 3 refills | Status: DC

## 2022-09-05 NOTE — Assessment & Plan Note (Signed)
Advised patient to reach out to HF clinic about ongoing chest pain issues related to Entresto dose.

## 2022-09-05 NOTE — Assessment & Plan Note (Signed)
Assessment: Patient with ASCVD not at LDL goal of < 55 Most recent LDL 161 on 08/12/22 Has been compliant with moderate intensity statin/ezetimibe : pravastatin 80, ezetimibe 10 Not able to tolerate high intensity statins secondary to myalgias: atorvastatin, rosuvastatin Reviewed options for lowering LDL cholesterol, including ezetimibe, PCSK-9 inhibitors, bempedoic acid and inclisiran.  Discussed mechanisms of action, dosing, side effects, potential decreases in LDL cholesterol and costs.  Also reviewed potential options for patient assistance.  Plan: Patient agreeable to starting Leqvio Repeat labs after:  1 month after second injection Lipid Liver function Patient signed benefits investigation for [facility]

## 2022-09-05 NOTE — Patient Instructions (Signed)
Your Results:             Your most recent labs Goal  Total Cholesterol 213 < 200  Triglycerides 71 < 150  HDL (happy/good cholesterol) 38 > 40  LDL (lousy/bad cholesterol 161 < 55   Medication changes:  We will start the process to get Leqvio covered by your insurance.  Once the prior authorization is complete, you will be notified to schedule an appointment for your injection.  Currently injections are done at the [place] [facility].    Lab orders:  We want to repeat labs after 2-3 months.  We will send you a lab order to remind you once we get closer to that time.     Thank you for choosing [facility]

## 2022-09-07 ENCOUNTER — Ambulatory Visit (INDEPENDENT_AMBULATORY_CARE_PROVIDER_SITE_OTHER): Payer: Medicare HMO

## 2022-09-07 ENCOUNTER — Telehealth: Payer: Self-pay

## 2022-09-07 DIAGNOSIS — I5022 Chronic systolic (congestive) heart failure: Secondary | ICD-10-CM

## 2022-09-07 DIAGNOSIS — Z95810 Presence of automatic (implantable) cardiac defibrillator: Secondary | ICD-10-CM

## 2022-09-07 NOTE — Telephone Encounter (Signed)
Remote ICM transmission received.  Attempted call to patient regarding ICM remote transmission and left message t return call.

## 2022-09-07 NOTE — Progress Notes (Signed)
EPIC Encounter for ICM Monitoring  Patient Name: Stephen Jennings is a 67 y.o. male Date: 09/07/2022 Primary Care Physican: Ladell Pier, MD Primary Cardiologist: Kelly/Bensimhon Electrophysiologist: Curt Bears 08/24/2022 Weight: 194 lbs (baseline 194-195)                                                            Attempted call to patient and unable to reach.  Left message to return call. Transmission reviewed.     CorVue thoracic impedance suggesting fluid levels returned to normal after Spironolactone was increased to 25 mg daily.    Prescribed:  Furosemide 20 mg take 1 tablet(s) (20 mg total) by mouth daily.   Eplerenone (inspra) 25 mg take 1 tablet(s) (25 mg total) by mouth daily   Labs: 08/29/2022 Creatinine 1.01, BUN 14, Potassium 3.2, Sodium 141, GFR >60 08/12/2022 Creatinine 1.17, BUN 19, Potassium 3.4, Sodium 140, GFR >60 07/16/2022 Creatinine 0.86, BUN 11, Potassium 3.3, Sodium 141, GFR >60  04/29/2022 Creatinine 1.15, BUN 17, Potassium 4.6, Sodium 143, GFR 71  A complete set of results can be found in Results Review.   Recommendations: Unable to reach.     Follow-up plan: ICM clinic phone appointment on 09/26/2022.   91 day device clinic remote transmission 10/15/2022.     EP/Cardiology Office Visits: 09/13/2022 with Dr. Claiborne Billings.   Recall 03/27/2020 with Dr Curt Bears (only 1 visit on 07/16/2019 following 03/2019 device implant)   Copy of ICM check sent to Dr. Curt Bears.    3 month ICM trend: 09/07/2022.    12-14 Month ICM trend:     [name], RN 09/07/2022 8:24 AM

## 2022-09-09 ENCOUNTER — Ambulatory Visit: Payer: Self-pay | Admitting: *Deleted

## 2022-09-09 NOTE — Telephone Encounter (Signed)
Reason for Disposition  [1] Follow-up call to recent contact AND [2] information only call, no triage required  Answer Assessment - Initial Assessment Questions 1. REASON FOR CALL or QUESTION: "What is your reason for calling today?" or "How can I best help you?" or "What question do you have that I can help answer?"     Pt returned call.   I read him the message from Evelena Asa, RN dated 1/r. Johnson from 08/30/2022 at 10:00 PM that there was not any protein in his urine.    No questions from pt.  Protocols used: Information Only Call - No Triage-A-AH

## 2022-09-09 NOTE — Telephone Encounter (Signed)
Noted  

## 2022-09-11 NOTE — Progress Notes (Signed)
Electrophysiology Office Note Date: 09/12/2022  ID:  Stephen Jennings, DOB [birth year], MRN [id number]  PCP: Ladell Pier, MD Primary Cardiologist: Shelva Majestic, MD Electrophysiologist: Constance Haw, MD   CC: Routine ICD follow-up  Stephen Jennings is a 67 y.o. male with medical history of ICM, CAD, TDP s/p STEMI, Tobacco abuse, and DM2 seen today for Will Meredith Leeds, MD for routine electrophysiology followup. Since last being seen in our clinic the patient reports doing very well.  he denies chest pain, palpitations, dyspnea, PND, orthopnea, nausea, vomiting, dizziness, syncope, edema, weight gain, or early satiety.   He has not had ICD shocks.   Device History: SJM dual chamber ICD, implanted 04/15/2019, secondary prevention   Past Medical History:  Diagnosis Date   Cardiogenic shock ([place])    CHF (congestive heart failure) ([place])    Coronary artery disease    Diabetes mellitus without complication ([place])    Hyperlipidemia    Medical history non-contributory     Current Outpatient Medications  Medication Instructions   Accu-Chek Softclix Lancets lancets Use to measure blood sugar twice a day   albuterol (PROVENTIL) 2.5 mg, Nebulization, Every 6 hours PRN   albuterol (VENTOLIN HFA) 108 (90 Base) MCG/ACT inhaler INHALE 2 PUFFS BY MOUTH EVERY 6 HOURS AS NEEDED FOR WHEEZING OR  SHORTNESS  OF  BREATH   benzonatate (TESSALON) 200 mg, Oral, 3 times daily PRN, For cough   bisoprolol (ZEBETA) 5 mg, Oral, Daily   Blood Glucose Monitoring Suppl (ACCU-CHEK GUIDE ME) w/Device KIT 1 kit, Does not apply, 2 times daily, Use to measure blood sugar twice a day   Budeson-Glycopyrrol-Formoterol (BREZTRI AEROSPHERE) 160-9-4.8 MCG/ACT AERO 2 puffs, Inhalation, 2 times daily   chlorpheniramine (CHLOR-TRIMETON) 4 MG tablet 1 tab tid prn runny nose   co-enzyme Q-10 30 mg, Oral, Daily   empagliflozin (JARDIANCE) 10 mg, Oral, Daily before breakfast   eplerenone (INSPRA) 25 mg, Oral, Daily    ezetimibe (ZETIA) 10 mg, Oral, Daily   fluticasone (FLONASE) 50 MCG/ACT nasal spray USE 1 SPRAY(S) IN EACH NOSTRIL ONCE DAILY AS NEEDED FOR ALLERGIES OR  RHINITIS   furosemide (LASIX) 40 mg, Oral, Daily   glucose blood (ACCU-CHEK GUIDE) test strip Use to measure blood sugar twice a day   Hyprom-Naphaz-Polysorb-Zn Sulf (CLEAR EYES COMPLETE) SOLN 1 drop, Ophthalmic, 3 times daily   Lokelma 10 g, Oral, Daily   loratadine (CLARITIN) 10 mg, Oral, Daily   montelukast (SINGULAIR) 10 mg, Oral, Daily at bedtime   nitroGLYCERIN (NITROSTAT) 0.4 mg, Sublingual, Every 5 min x3 PRN   omeprazole (PRILOSEC) 20 MG capsule Take 1 capsule by mouth once daily   pravastatin (PRAVACHOL) 80 mg, Oral, Every evening   sacubitril-valsartan (ENTRESTO) 97-103 MG 1 tablet, Oral, 2 times daily   ticagrelor (BRILINTA) 90 mg, Oral, 2 times daily   traZODone (DESYREL) 50 MG tablet TAKE 1/2 TO 1 (ONE-HALF TO ONE) TABLET BY MOUTH AT BEDTIME    Family History: Family History  Problem Relation Age of Onset   Hypertension Mother    Heart failure Mother    Headache Neg Hx    Migraines Neg Hx     Physical Exam: Vitals:   09/12/22 0804  BP: 136/76  Pulse: 62  SpO2: 98%  Weight: 202 lb (91.6 kg)  Height: '5\' 9"'$  (1.753 m)     GEN- NAD. A&O x 3. Normal affect. HEENT: Normocephalic, atraumatic Lungs- CTAB, Normal effort.  Heart- Regular rate and rhythm rate and rhythm. No M/G/R.  Extremities- No peripheral edema. no clubbing or cyanosis Skin- warm and dry, no rash or lesion; ICD pocket well healed  ICD interrogation- reviewed in detail today,  See PACEART report  EKG is not ordered today. EKG from 08/12/2022 reviewed which showed NSR at 73 bpm  Other studies Reviewed: Additional studies/ records that were reviewed today include: Previous EP office notes.   Assessment and Plan:  1.  Chronic systolic dysfunction s/p [employer] Jude dual chamber ICD  LVEF 07/2021 35-40%, update pending euvolemic today Stable on an  appropriate medical regimen Normal ICD function See Pace Art report No changes today  2. H/o VT/Torsades (in setting of STEMI) Recheck BMET today with K 3.2 on 1/8  3. CAD No s/s CAD  4. HTN Stable on current regimen    Labs/ tests ordered today include:  Orders Placed This Encounter  Procedures   Basic metabolic panel     Disposition:   Follow up with Dr. Curt Bears in 12 months   Signed, Shirley Friar, [name]  09/12/2022 8:10 AM  [facility] [address] [place]  [phone] (office) [phone] (fax)

## 2022-09-12 ENCOUNTER — Encounter: Payer: Self-pay | Admitting: Student

## 2022-09-12 ENCOUNTER — Ambulatory Visit: Payer: Medicare HMO | Attending: Student | Admitting: Student

## 2022-09-12 VITALS — BP 136/76 | HR 62 | Ht 69.0 in | Wt 202.0 lb

## 2022-09-12 DIAGNOSIS — I10 Essential (primary) hypertension: Secondary | ICD-10-CM | POA: Diagnosis not present

## 2022-09-12 DIAGNOSIS — I5022 Chronic systolic (congestive) heart failure: Secondary | ICD-10-CM

## 2022-09-12 DIAGNOSIS — I2510 Atherosclerotic heart disease of native coronary artery without angina pectoris: Secondary | ICD-10-CM | POA: Diagnosis not present

## 2022-09-12 LAB — CUP PACEART INCLINIC DEVICE CHECK
Battery Remaining Longevity: 74 mo
Brady Statistic RA Percent Paced: 2.1 %
Brady Statistic RV Percent Paced: 0 %
Date Time Interrogation Session: 20240122083412
HighPow Impedance: 66.375
Implantable Lead Connection Status: 753985
Implantable Lead Connection Status: 753985
Implantable Lead Implant Date: 20200824
Implantable Lead Implant Date: 20200824
Implantable Lead Location: 753859
Implantable Lead Location: 753860
Implantable Pulse Generator Implant Date: 20200824
Lead Channel Impedance Value: 337.5 Ohm
Lead Channel Impedance Value: 387.5 Ohm
Lead Channel Pacing Threshold Amplitude: 0.5 V
Lead Channel Pacing Threshold Amplitude: 0.5 V
Lead Channel Pacing Threshold Amplitude: 1.25 V
Lead Channel Pacing Threshold Amplitude: 1.25 V
Lead Channel Pacing Threshold Pulse Width: 0.5 ms
Lead Channel Pacing Threshold Pulse Width: 0.5 ms
Lead Channel Pacing Threshold Pulse Width: 0.5 ms
Lead Channel Pacing Threshold Pulse Width: 0.5 ms
Lead Channel Sensing Intrinsic Amplitude: 1.2 mV
Lead Channel Sensing Intrinsic Amplitude: 11.9 mV
Lead Channel Setting Pacing Amplitude: 2 V
Lead Channel Setting Pacing Amplitude: 2.5 V
Lead Channel Setting Pacing Pulse Width: 0.5 ms
Lead Channel Setting Sensing Sensitivity: 0.5 mV
Pulse Gen Serial Number: 1300954

## 2022-09-12 LAB — BASIC METABOLIC PANEL
BUN/Creatinine Ratio: 14 (ref 10–24)
BUN: 15 mg/dL (ref 8–27)
CO2: 28 mmol/L (ref 20–29)
Calcium: 9.3 mg/dL (ref 8.6–10.2)
Chloride: 101 mmol/L (ref 96–106)
Creatinine, Ser: 1.09 mg/dL (ref 0.76–1.27)
Glucose: 127 mg/dL — ABNORMAL HIGH (ref 70–99)
Potassium: 3.9 mmol/L (ref 3.5–5.2)
Sodium: 143 mmol/L (ref 134–144)
eGFR: 75 mL/min/{1.73_m2} (ref >59)

## 2022-09-12 NOTE — Patient Instructions (Signed)
Medication Instructions:  Your physician recommends that you continue on your current medications as directed. Please refer to the Current Medication list given to you today.  *If you need a refill on your cardiac medications before your next appointment, please call your pharmacy*   Lab Work: TODAY: BMET  If you have labs (blood work) drawn today and your tests are completely normal, you will receive your results only by: [place] (if you have MyChart) OR A paper copy in the mail If you have any lab test that is abnormal or we need to change your treatment, we will call you to review the results.   Follow-Up: At [facility], you and your health needs are our priority.  As part of our continuing mission to provide you with exceptional heart care, we have created designated Provider Care Teams.  These Care Teams include your primary Cardiologist (physician) and Advanced Practice Providers (APPs -  Physician Assistants and Nurse Practitioners) who all work together to provide you with the care you need, when you need it.  We recommend signing up for the patient portal called "MyChart".  Sign up information is provided on this After Visit Summary.  MyChart is used to connect with patients for Virtual Visits (Telemedicine).  Patients are able to view lab/test results, encounter notes, upcoming appointments, etc.  Non-urgent messages can be sent to your provider as well.   To learn more about what you can do with MyChart, go to [email or web address].    Your next appointment:   1 year(s)  Provider:   Allegra Lai, MD

## 2022-09-13 ENCOUNTER — Ambulatory Visit: Payer: Medicare HMO | Attending: Cardiovascular Disease | Admitting: Cardiovascular Disease

## 2022-09-13 ENCOUNTER — Encounter: Payer: Self-pay | Admitting: Cardiovascular Disease

## 2022-09-13 VITALS — BP 112/68 | HR 53 | Ht 69.0 in | Wt 199.4 lb

## 2022-09-13 DIAGNOSIS — E785 Hyperlipidemia, unspecified: Secondary | ICD-10-CM

## 2022-09-13 DIAGNOSIS — Z95810 Presence of automatic (implantable) cardiac defibrillator: Secondary | ICD-10-CM | POA: Diagnosis not present

## 2022-09-13 DIAGNOSIS — I2102 ST elevation (STEMI) myocardial infarction involving left anterior descending coronary artery: Secondary | ICD-10-CM | POA: Diagnosis not present

## 2022-09-13 DIAGNOSIS — I2510 Atherosclerotic heart disease of native coronary artery without angina pectoris: Secondary | ICD-10-CM

## 2022-09-13 DIAGNOSIS — T466X5A Adverse effect of antihyperlipidemic and antiarteriosclerotic drugs, initial encounter: Secondary | ICD-10-CM | POA: Diagnosis not present

## 2022-09-13 DIAGNOSIS — I255 Ischemic cardiomyopathy: Secondary | ICD-10-CM | POA: Diagnosis not present

## 2022-09-13 DIAGNOSIS — M791 Myalgia, unspecified site: Secondary | ICD-10-CM

## 2022-09-13 NOTE — Patient Instructions (Signed)
    Follow-Up: At [facility], you and your health needs are our priority.  As part of our continuing mission to provide you with exceptional heart care, we have created designated Provider Care Teams.  These Care Teams include your primary Cardiologist (physician) and Advanced Practice Providers (APPs -  Physician Assistants and Nurse Practitioners) who all work together to provide you with the care you need, when you need it.  We recommend signing up for the patient portal called "MyChart".  Sign up information is provided on this After Visit Summary.  MyChart is used to connect with patients for Virtual Visits (Telemedicine).  Patients are able to view lab/test results, encounter notes, upcoming appointments, etc.  Non-urgent messages can be sent to your provider as well.   To learn more about what you can do with MyChart, go to [email or web address].    Your next appointment:   12 month(s)  Provider:   Shelva Majestic, MD

## 2022-09-13 NOTE — Progress Notes (Signed)
Cardiology Office Note    Date:  09/17/2022   ID:  Stephen Jennings, DOB 1956-05-04, MRN [id number]  PCP:  Ladell Pier, MD  Cardiologist:  Shelva Majestic, MD   6 month follow-up cardiology evaluation  History of Present Illness:  Stephen Jennings is a 67 y.o. male who has a history of PAD and suffered an acute coronary syndrome in August 2020 with acute respiratory failure and cardiogenic shock in the setting of an anterior ST segment elevation myocardial infarction.  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  The RCA had 70% proximal stenosis.  The LAD was occluded proximally with TIMI 0 flow and he underwent successful intervention to the LAD PTCA and stenting of the proximal LAD, PTCA of the ostium of the diagonal vessel, and PTCA of the mid LAD with insertion of a 2.75 x 26 mm Resolute stent to the proximal LAD before the diagonal bifurcation.  Due to cardiogenic shock an Impella catheter was inserted for hemodynamic support.  Subsequently, he developed torsade and underwent ICD implantation.  An echo Doppler study in April 2022 showed normalization of LV function with EF at 55 to 60%.  Patient had done well and apparently stopped taking Brilinta in April 2022, 6 weeks prior to his most recent ACS which occurred on February 02, 2021.  Catheterization showed EF estimated 25 to 30% with significant thrombus burden in the LAD immediately after the stent extending into the diagonal vessel.  He was treated with Aggrastat and bolused with Brilinta and 2 days later return for repeat catheterization by me on February 04, 2021.  Relook angiography of the left coronary system showed improvement in the previous thrombus burden in the diagonal and LAD distal stent and beyond but moderate residual thrombus was still present for which he underwent successful percutaneous coronary intervention utilizing Pronto  thrombectomy, intravascular ultrasound, and ultimate PTCA of the distal stented segment and mid LAD.  Was recommended that he continue DAPT indefinitely.  Subsequently, the patient has been followed by Dr. Haroldine Laws at advanced heart clinic.  He was last seen by Dr. Haroldine Laws on March 23, 2021 at which time he admitted that over the past week he was experiencing more shortness of breath with activity or lying down.  Prior to that evaluation he had been seen in the Pharm.D. clinic and Lasix was stopped due to mild HKA and his beta-blocker increased and losartan was changed to [facility] 24/26 twice daily.  When seen by Dr. Haroldine Laws recently, it was recommended that Entresto be increased to 49/51 mg twice daily and that he take Lasix 40 mg for 2 days then 40 mg on Monday Wednesday and Friday.  He also has been on Jardiance 10 mg daily and had been on metoprolol succinate with plans to possibly switch to bisoprolol if wheezing develops.  His ICD was interrogated and there was no VT or AF noted.  I saw him for my initial cardiology office evaluation in March 24, 2021 following his June 2022 hospitalization.  He has not had any recurrent chest pain.  Breathing has improved with the recent resumption of Lasix.  He continues to be on aspirin and Brilinta 90 mg twice daily for DAPT.  At that time, he was only on rosuvastatin at 20 mg with Zetia and with his LDL cholesterol at 150 with total cholesterol 221 in June 2022 I suggested he increase rosuvastatin to 40 mg and  continue Zetia 10 mg with target LDL less than 55 if at all possible.  Since I saw him he has felt well.  He denies any recurrent chest pain.  He was reevaluated in his heart clinic failure clinic by Allena Katz, [place] on May 25, 2021 and saw Dr. Haroldine Laws on July 27, 2021.  He was felt to have stable NYHA II symptomatology.  I saw him on August 04, 2021 at which time he was on Entresto 97/103 mg twice a day, Lasix 40 mg daily, recently  increased from Monday Wednesday and Friday, bisoprolol 5 mg, and Jardiance 10 mg daily.  He was no longer on spironolactone due to hyperkalemia.  He has not had any VT or AF on recent ICD interrogation.  He denied any recurrent anginal symptomatology.  He underwent a follow-up echo Doppler study on July 27, 2021 which showed EF at 35 to 40% with moderate dilation of his left ventricle and grade 2 diastolic dysfunction.  There was mild biatrial enlargement, mild MR.    I last saw him on March 16, 2022.  At that time he had apparently run out of Entresto for approximately 5 to 6 weeks. There is some question that he may have been back on losartan and also ran out of this as well.  He continues to be on bisoprolol 5 mg, Jardiance 10 mg, eplerenone 12.5 mg, Zetia 10 mg, and is on day history and as needed albuterol for his lungs.  He continues to be on aspirin/Brilinta 90 mg twice a day and is on pravastatin 80 mg for hyperlipidemia.  He denies any chest pain.  He quit smoking in August 2020 after smoking for 40 years.  During that evaluation, I provided him with samples of Entresto 49/51 mg twice a day.  He continued to be on pravastatin 80 mg and LDL cholesterol was 36 in December 2022.  I recommended follow-up laboratory be checked including an LP(a).  Since I have seen him, he has been evaluated by Esmond Plants, in follow-up of reinstitution of Entresto, Advanced Heart Failure clinic by Allena Katz, [place] on August 12, 2022 and most recently in EP clinic with defibrillator check by Barrington Ellison, [place] yesterday.  Presently, Stephen Jennings feels well.  He denies any awareness of defibrillator discharge.  He apparently was seen in the emergency room in November 2023 for acute CHF which time his Lasix was increased.  When seen at subsequent heart failure evaluation, he was felt to be euvolemic and denied palpitations, chest pain, dizziness edema, PND orthopnea.  He presents for follow-up  evaluation.   Past Medical History:  Diagnosis Date   Cardiogenic shock ([place])    CHF (congestive heart failure) ([place])    Coronary artery disease    Diabetes mellitus without complication ([place])    Hyperlipidemia    Medical history non-contributory     Past Surgical History:  Procedure Laterality Date   CARDIAC CATHETERIZATION     CORONARY BALLOON ANGIOPLASTY N/A 02/04/2021   Procedure: CORONARY BALLOON ANGIOPLASTY;  Surgeon: Troy Sine, MD;  Location: [place] [facility];  Service: Cardiovascular;  Laterality: N/A;   CORONARY THROMBECTOMY N/A 02/04/2021   Procedure: Coronary Thrombectomy;  Surgeon: Troy Sine, MD;  Location: [place] [facility];  Service: Cardiovascular;  Laterality: N/A;   CORONARY/GRAFT ACUTE MI REVASCULARIZATION N/A 03/31/2019   Procedure: Coronary/Graft Acute MI Revascularization;  Surgeon: Troy Sine, MD;  Location: [place] [facility];  Service: Cardiovascular;  Laterality: N/A;  ICD IMPLANT N/A 04/15/2019   Procedure: ICD IMPLANT;  Surgeon: Constance Haw, MD;  Location: [place] [facility];  Service: Cardiovascular;  Laterality: N/A;   INTRAVASCULAR ULTRASOUND/IVUS N/A 02/04/2021   Procedure: Intravascular Ultrasound/IVUS;  Surgeon: Troy Sine, MD;  Location: [place] [facility];  Service: Cardiovascular;  Laterality: N/A;   LEFT HEART CATH AND CORONARY ANGIOGRAPHY N/A 03/31/2019   Procedure: LEFT HEART CATH AND CORONARY ANGIOGRAPHY;  Surgeon: Troy Sine, MD;  Location: [place] [facility];  Service: Cardiovascular;  Laterality: N/A;   LEFT HEART CATH AND CORONARY ANGIOGRAPHY N/A 02/02/2021   Procedure: LEFT HEART CATH AND CORONARY ANGIOGRAPHY;  Surgeon: Troy Sine, MD;  Location: [place] [facility];  Service: Cardiovascular;  Laterality: N/A;   LEFT HEART CATH AND CORONARY ANGIOGRAPHY N/A 02/04/2021   Procedure: LEFT HEART CATH AND CORONARY ANGIOGRAPHY;  Surgeon: Troy Sine, MD;  Location: [place] [facility];  Service: Cardiovascular;   Laterality: N/A;   NO PAST SURGERIES     RIGHT HEART CATH N/A 03/31/2019   Procedure: RIGHT HEART CATH;  Surgeon: Troy Sine, MD;  Location: [place] [facility];  Service: Cardiovascular;  Laterality: N/A;   VENTRICULAR ASSIST DEVICE INSERTION N/A 03/31/2019   Procedure: VENTRICULAR ASSIST DEVICE INSERTION;  Surgeon: Troy Sine, MD;  Location: [place] [facility];  Service: Cardiovascular;  Laterality: N/A;    Current Medications: Outpatient Medications Prior to Visit  Medication Sig Dispense Refill   Accu-Chek Softclix Lancets lancets Use to measure blood sugar twice a day 100 each 6   albuterol (PROVENTIL) (2.5 MG/3ML) 0.083% nebulizer solution Take 3 mLs (2.5 mg total) by nebulization every 6 (six) hours as needed for wheezing or shortness of breath. 75 mL 12   albuterol (VENTOLIN HFA) 108 (90 Base) MCG/ACT inhaler INHALE 2 PUFFS BY MOUTH EVERY 6 HOURS AS NEEDED FOR WHEEZING OR  SHORTNESS  OF  BREATH 18 g 4   benzonatate (TESSALON) 100 MG capsule Take 2 capsules (200 mg total) by mouth 3 (three) times daily as needed. For cough 40 capsule 0   bisoprolol (ZEBETA) 5 MG tablet Take 1 tablet (5 mg total) by mouth daily. 30 tablet 11   Blood Glucose Monitoring Suppl (ACCU-CHEK GUIDE ME) w/Device KIT 1 kit by Does not apply route in the morning and at bedtime. Use to measure blood sugar twice a day 1 kit 0   Budeson-Glycopyrrol-Formoterol (BREZTRI AEROSPHERE) 160-9-4.8 MCG/ACT AERO Inhale 2 puffs into the lungs 2 (two) times daily. 10.7 g 11   chlorpheniramine (CHLOR-TRIMETON) 4 MG tablet 1 tab tid prn runny nose 40 tablet 0   co-enzyme Q-10 30 MG capsule Take 1 capsule (30 mg total) by mouth daily. 90 capsule 3   empagliflozin (JARDIANCE) 10 MG TABS tablet Take 1 tablet (10 mg total) by mouth daily before breakfast. 90 tablet 3   eplerenone (INSPRA) 25 MG tablet Take 1 tablet (25 mg total) by mouth daily. 90 tablet 3   ezetimibe (ZETIA) 10 MG tablet Take 1 tablet (10 mg total) by mouth daily.  90 tablet 3   fluticasone (FLONASE) 50 MCG/ACT nasal spray USE 1 SPRAY(S) IN EACH NOSTRIL ONCE DAILY AS NEEDED FOR ALLERGIES OR  RHINITIS 48 g 0   furosemide (LASIX) 20 MG tablet Take 2 tablets (40 mg total) by mouth daily. 180 tablet 3   glucose blood (ACCU-CHEK GUIDE) test strip Use to measure blood sugar twice a day 100 each 6   Hyprom-Naphaz-Polysorb-Zn Sulf (  CLEAR EYES COMPLETE) SOLN Apply 1 drop to eye 3 (three) times daily. 15 mL 1   loratadine (CLARITIN) 10 MG tablet Take 1 tablet (10 mg total) by mouth daily. 30 tablet 11   montelukast (SINGULAIR) 10 MG tablet Take 1 tablet (10 mg total) by mouth at bedtime. 30 tablet 11   nitroGLYCERIN (NITROSTAT) 0.4 MG SL tablet Place 1 tablet (0.4 mg total) under the tongue every 5 (five) minutes x 3 doses as needed for chest pain. 25 tablet 3   omeprazole (PRILOSEC) 20 MG capsule Take 1 capsule by mouth once daily 30 capsule 11   pravastatin (PRAVACHOL) 80 MG tablet Take 1 tablet (80 mg total) by mouth every evening. 90 tablet 3   sacubitril-valsartan (ENTRESTO) 97-103 MG Take 1 tablet by mouth 2 (two) times daily. 180 tablet 3   sodium zirconium cyclosilicate (LOKELMA) 5 g packet Take 10 g by mouth daily. 30 each 11   ticagrelor (BRILINTA) 90 MG TABS tablet Take 1 tablet (90 mg total) by mouth 2 (two) times daily. 180 tablet 1   traZODone (DESYREL) 50 MG tablet TAKE 1/2 TO 1 (ONE-HALF TO ONE) TABLET BY MOUTH AT BEDTIME 90 tablet 0   No facility-administered medications prior to visit.     Allergies:   Atorvastatin, Spironolactone, Metformin and related, and Other   Social History   Socioeconomic History   Marital status: Married    Spouse name: Not on file   Number of children: 1   Years of education: 8   Highest education level: 8th grade  Occupational History   Not on file  Tobacco Use   Smoking status: Former    Types: Cigarettes    Quit date: 04/08/2019    Years since quitting: 3.4    Passive exposure: Current (wife smokes)    Smokeless tobacco: Never  Vaping Use   Vaping Use: Never used  Substance and Sexual Activity   Alcohol use: No   Drug use: No   Sexual activity: Not Currently  Other Topics Concern   Not on file  Social History Narrative   Lives with wife   Right handed   Caffeine: about 2 cups of coffee every morning, no soda   Social Determinants of Health   Financial Resource Strain: Low Risk  (03/14/2022)   Overall Financial Resource Strain (CARDIA)    Difficulty of Paying Living Expenses: Not very hard  Food Insecurity: No Food Insecurity (03/14/2022)   Hunger Vital Sign    Worried About Running Out of Food in the Last Year: Never true    Ran Out of Food in the Last Year: Never true  Transportation Needs: No Transportation Needs (07/20/2022)   PRAPARE - Hydrologist (Medical): No    Lack of Transportation (Non-Medical): No  Physical Activity: Sufficiently Active (03/14/2022)   Exercise Vital Sign    Days of Exercise per Week: 7 days    Minutes of Exercise per Session: 60 min  Stress: No Stress Concern Present (03/14/2022)   [place]    Feeling of Stress : Not at all  Social Connections: Moderately Isolated (03/14/2022)   Social Connection and Isolation Panel [NHANES]    Frequency of Communication with Friends and Family: More than three times a week    Frequency of Social Gatherings with Friends and Family: More than three times a week    Attends Religious Services: Never    Retail buyer of [employer]  or Organizations: No    Attends Archivist Meetings: Never    Marital Status: Married     Family History:  The patient's family history includes Heart failure in his mother; Hypertension in his mother.   ROS General: Negative; No fevers, chills, or night sweats;  HEENT: Negative; No changes in vision or hearing, sinus congestion, difficulty swallowing Pulmonary: Negative; No cough,  wheezing, shortness of breath, hemoptysis Cardiovascular: The HPI GI: Negative; No nausea, vomiting, diarrhea, or abdominal pain GU: Negative; No dysuria, hematuria, or difficulty voiding Musculoskeletal: Negative; no myalgias, joint pain, or weakness Hematologic/Oncology: Negative; no easy bruising, bleeding Endocrine: Negative; no heat/cold intolerance; no diabetes Neuro: Negative; no changes in balance, headaches Skin: Negative; No rashes or skin lesions Psychiatric: Negative; No behavioral problems, depression Sleep: Negative; No snoring, daytime sleepiness, hypersomnolence, bruxism, restless legs, hypnogognic hallucinations, no cataplexy Other comprehensive 14 point system review is negative.   PHYSICAL EXAM:   VS:  BP 112/68   Pulse (!) 53   Ht '5\' 9"'$  (1.753 m)   Wt 199 lb 6.4 oz (90.4 kg)   SpO2 95%   BMI 29.45 kg/m     Repeat blood pressure by me was 124/70  Wt Readings from Last 3 Encounters:  09/13/22 199 lb 6.4 oz (90.4 kg)  09/12/22 202 lb (91.6 kg)  09/05/22 201 lb (91.2 kg)    General: Alert, oriented, no distress.  Skin: normal turgor, no rashes, warm and dry HEENT: Normocephalic, atraumatic. Pupils equal round and reactive to light; sclera anicteric; extraocular muscles intact;  Nose without nasal septal hypertrophy Mouth/Parynx benign; Mallinpatti scale 3 Neck: No JVD, no carotid bruits; normal carotid upstroke Lungs: clear to ausculatation and percussion; no wheezing or rales Chest wall: without tenderness to palpitation Heart: PMI not displaced, RRR, s1 s2 normal, 1/6 systolic murmur, no diastolic murmur, no rubs, gallops, thrills, or heaves Abdomen: soft, nontender; no hepatosplenomehaly, BS+; abdominal aorta nontender and not dilated by palpation. Back: no CVA tenderness Pulses 2+ Musculoskeletal: full range of motion, normal strength, no joint deformities Extremities: no clubbing cyanosis or edema, Homan's sign negative  Neurologic: grossly nonfocal;  Cranial nerves grossly wnl Psychologic: Normal mood and affect    Studies/Labs Reviewed:   September 13, 2022 ECG (independently read by me): Sinus bradycardia at 53, QS V1-5 T wave inversion anteriorly    March 16, 2022 ECG (independently read by me): NSR at 73, LAE, QS V1-2  August 04, 2021 ECG (independently read by me):  NSR at 67, QS V1-2, PRWP V1-5; T wave abnormality anteriorly  March 24, 2021 ECG (independently read by me):  NSR at 81; Biatrial enlargement, QS V1-2, normal intervals  Recent Labs:    Latest Ref Rng & Units 09/12/2022    8:21 AM 08/29/2022   10:01 AM 08/12/2022   10:56 AM  BMP  Glucose 70 - 99 mg/dL 127  126  124   BUN 8 - 27 mg/dL '15  14  19   '$ Creatinine 0.76 - 1.27 mg/dL 1.09  1.01  1.17   BUN/Creat Ratio 10 - 24 14     Sodium 134 - 144 mmol/L 143  141  140   Potassium 3.5 - 5.2 mmol/L 3.9  3.2  3.4   Chloride 96 - 106 mmol/L 101  102  104   CO2 20 - 29 mmol/L '28  31  27   '$ Calcium 8.6 - 10.2 mg/dL 9.3  8.9  9.0         Latest  Ref Rng & Units 04/29/2022    4:18 PM 07/27/2021   10:40 AM 05/25/2021    9:30 AM  Hepatic Function  Total Protein 6.0 - 8.5 g/dL 7.1  6.0  6.7   Albumin 3.9 - 4.9 g/dL 4.6  3.6  4.0   AST 0 - 40 IU/L 28  35  33   ALT 0 - 44 IU/L 29  75  29   Alk Phosphatase 44 - 121 IU/L 104  67  76   Total Bilirubin 0.0 - 1.2 mg/dL 0.6  0.5  0.6        Latest Ref Rng & Units 07/16/2022   10:40 PM 10/04/2021    4:17 PM 04/23/2021   10:25 AM  CBC  WBC 4.0 - 10.5 K/uL 9.1  8.1  7.2   Hemoglobin 13.0 - 17.0 g/dL 12.4  13.6  13.1   Hematocrit 39.0 - 52.0 % 37.3  41.0  39.9   Platelets 150 - 400 K/uL 185  225.0  258    Lab Results  Component Value Date   MCV 89.7 07/16/2022   MCV 88.4 10/04/2021   MCV 90 04/23/2021   Lab Results  Component Value Date   TSH 0.784 02/02/2021   Lab Results  Component Value Date   HGBA1C 5.9 04/29/2022     BNP    Component Value Date/Time   BNP 192.6 (H) 08/12/2022 1056    ProBNP     Component Value Date/Time   PROBNP 189.0 (H) 10/04/2021 1617     Lipid Panel     Component Value Date/Time   CHOL 213 (H) 08/12/2022 1056   CHOL 117 11/16/2020 0912   TRIG 71 08/12/2022 1056   HDL 38 (L) 08/12/2022 1056   HDL 37 (L) 11/16/2020 0912   CHOLHDL 5.6 08/12/2022 1056   VLDL 14 08/12/2022 1056   LDLCALC 161 (H) 08/12/2022 1056   LDLCALC 66 11/16/2020 0912   LABVLDL 14 11/16/2020 0912     RADIOLOGY: CUP PACEART INCLINIC DEVICE CHECK  Result Date: 09/12/2022 ICD check in clinic. Normal device function. Thresholds and sensing consistent with previous device measurements. Impedance trends stable over time. No mode switches. No ventricular arrhythmias. Histogram distribution appropriate for patient and level of  activity. No changes made this session. Device programmed at appropriate safety margins. Device programmed to optimize intrinsic conduction. Estimated longevity 55yr79mo. Pt enrolled in remote follow-up. Patient education completed including shock plan. Auditory/vibratory alert demonstrated.    Additional studies/ records that were reviewed today include:   Emergent cath :February 02, 2021 Prox RCA lesion is 20% stenosed. Prox LAD to Mid LAD lesion is 15% stenosed. 1st Diag lesion is 5% stenosed. Ost LAD to Prox LAD lesion is 20% stenosed. Dist LAD-1 lesion is 40% stenosed. Dist LAD-2 lesion is 80% stenosed. There is severe left ventricular systolic dysfunction. LV end diastolic pressure is severely elevated. The left ventricular ejection fraction is 25-35% by visual estimate.   Patent proximal LAD stent with mild 20% in-stent narrowing.  There is significant thrombus burden immediately after the stent extending into the diagonal vessel and mid LAD with TIMI-3 flow.  There is a 305 mid distal LAD stenosis and  distal 80% stenosis in a small caliber distal LAD.   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  The dominant RCA had improvement in the previous 70% stenosis which now appeared approximately 20%.  The RCA supplied the PDA  and PLA vessel   Acute LV dysfunction with EF estimated at 25 to 30% with severe hypocontractility involving the mid distal anterolateral wall apex and apical inferior segment.  Elevated LVEDP at 29 mmHg.   RECOMMENDATION: Patient was started on Aggrastat in the catheterization laboratory and was bolused with Brilinta 180 mg orally.  He does not appear to have significant stenoses at the site of his thrombotic diagonal and LAD and plan continuation of Aggrastat for approximately 48 hours with restudy.  Of note, the patient had recently stopped his ticagrelor and a echo on December 14, 2020 had shown normalization of LV function without wall motion abnormalities.      02/04/2021: Repeat cath/PCI Ost LAD to Prox LAD lesion is 20% stenosed. Prox LAD to Mid LAD lesion is 40% stenosed. Dist LAD-1 lesion is 40% stenosed. Dist LAD-2 lesion is 80% stenosed. Prox RCA lesion is 20% stenosed. Post intervention, there is a 0% residual stenosis. 1st Diag lesion is 5% stenosed.   Relook angiography of the left coronary system showed improvement in the previous thrombus burden in the diagonal and LAD distal stent and beyond but moderate residual thrombus was still present.   LVEDP 8 mmHg   Successful percutaneous coronary intervention of the LAD utilizing Pronto thrombectomy, intravascular ultrasound and ultimate PTCA of the distal stented segment and mid LAD with residual narrowing 0%.  Resolution of thrombus in the diagonal vessel with mild 5% narrowing.  Very mild thrombus distal stent in the region of the septal perforating artery.  There is brisk TIMI-3 flow.   RECOMMENDATION: DAPT indefinitely.  Would continue Aggrastat for additional 24 to 36 hours.  GDMT Medical therapy for his LV function/ischemic cardiomyopathy.      Intervention     ECHO:  July 27, 2021 IMPRESSIONS   1. Left ventricular ejection fraction, by estimation, is 35 to 40%. The  left ventricle has moderately decreased function. The left ventricle  demonstrates regional wall motion abnormalities (see scoring  diagram/findings for description). The left  ventricular internal cavity size was moderately dilated. Left ventricular  diastolic parameters are consistent with Grade II diastolic dysfunction  (pseudonormalization). There is akinesis of the left ventricular,  anteroseptal wall.   2. Right ventricular systolic function is normal. The right ventricular  size is normal.   3. Left atrial size was mildly dilated.   4. Right atrial size was mildly dilated.   5. The mitral valve is normal in structure. Mild mitral valve  regurgitation. No evidence of mitral stenosis.   6. The aortic valve is tricuspid. Aortic valve regurgitation is not  visualized. No aortic stenosis is present.   7. The inferior vena cava is normal in size with greater than 50%  respiratory variability, suggesting right atrial pressure of 3 mmHg.    ASSESSMENT:    1. CAD in native artery   2. STEMI involving left anterior descending coronary artery [facility]): August 2020; June 2022   3. Ischemic cardiomyopathy   4. Cardiac defibrillator in situ   5. Hyperlipidemia with target LDL less than 70   6. Myalgia due to statin     PLAN:  Stephen Jennings is a 39 year old gentleman who has a history of COPD, type 2 diabetes mellitus, CAD, and had suffered an initial myocardial infarction complicated by respiratory failure and cardiogenic shock in June 2020 at which time he underwent successful PCI to totally occluded LAD.  He required Impella support and initial echo showed EF at 20 to 25%.  His  hospital course was complicated by torsade and he underwent placement of an ICD prior to discharge.  His LV function subsequently normalized by April 2022 at which time his Kary Kos was discontinued.   Unfortunately 2 months later he presented with ACS secondary to extensive thrombus burden in the LAD for which he was treated with Aggrastat and on relook 2 days later he underwent Pronto thrombectomy intravascular ultrasound and ultimate PTCA of the distal stented segment and mid LAD with residual narrowing of 0%.  Subsequently, he has been without anginal symptomatology.  At his office visit in December 2022 he was on optimal guideline directed medical therapy with Entresto 97/103 twice a day, Lasix 40 mg, bisoprolol 5 mg, and Jardiance.  He was no longer on spironolactone due to hyperkalemia.  He has not had any further episodes of VT or AF on ICD interrogation.  He has continued to be on DAPT with aspirin/Brilinta.  At his last office visit with me in July 2023 he had run out of his Delene Loll for 5 to 6 weeks.  He has subsequently been back on therapy.  He underwent repeat ER evaluation for increasing symptoms in November treated with increased diuretic regimen.  Most recently, he is euvolemic and was doing well when last seen in advanced heart failure clinic in December 2023.  He underwent defibrillator check yesterday which was stable.  Blood pressure today is stable at on his current regimen of bisoprolol 5 mg daily, Jardiance 10 mg, Entresto 97/103 mg twice a day, and prolonged 25 mg daily in addition to his furosemide 40 mg daily.  He continues to be on DAPT and is tolerating Brilinta.  With his prior recurrent event after stopping Brilinta I would continue this indefinitely.  He has been on pravastatin 80 mg daily and LDL cholesterol in December 2022 was excellent at 36.  Most recent laboratory on August 12, 2022 was checked by the advanced heart failure team.  Apparently this showed LDL cholesterol at 161 with total cholesterol 213.  He was subsequently seen by Nehemiah Massed in pharmacy clinic and with his inability to tolerate high intensity statin therapy plans are to initiate inclisiran.  Since he  sees advanced heart failure clinic at 80-monthintervals, I will see him in 1 year for follow-up evaluation or sooner as needed.    Medication Adjustments/Labs and Tests Ordered: Current medicines are reviewed at length with the patient today.  Concerns regarding medicines are outlined above.  Medication changes, Labs and Tests ordered today are listed in the Patient Instructions below. Patient Instructions     Follow-Up: At [facility] you and your health needs are our priority.  As part of our continuing mission to provide you with exceptional heart care, we have created designated Provider Care Teams.  These Care Teams include your primary Cardiologist (physician) and Advanced Practice Providers (APPs -  Physician Assistants and Nurse Practitioners) who all work together to provide you with the care you need, when you need it.  We recommend signing up for the patient portal called "MyChart".  Sign up information is provided on this After Visit Summary.  MyChart is used to connect with patients for Virtual Visits (Telemedicine).  Patients are able to view lab/test results, encounter notes, upcoming appointments, etc.  Non-urgent messages can be sent to your provider as well.   To learn more about what you can do with MyChart, go to [email or web address]    Your next appointment:   12 month(s)  Provider:  Shelva Majestic, MD        Signed, Shelva Majestic, MD  09/17/2022 12:04 PM    [place] [address] [place], [place], [place]  [address] Phone: [phone]

## 2022-09-17 ENCOUNTER — Encounter: Payer: Self-pay | Admitting: Cardiovascular Disease

## 2022-09-26 ENCOUNTER — Ambulatory Visit: Payer: Medicare HMO | Attending: Cardiology

## 2022-09-26 DIAGNOSIS — I5022 Chronic systolic (congestive) heart failure: Secondary | ICD-10-CM | POA: Diagnosis not present

## 2022-09-26 DIAGNOSIS — Z95810 Presence of automatic (implantable) cardiac defibrillator: Secondary | ICD-10-CM | POA: Diagnosis not present

## 2022-09-27 NOTE — Progress Notes (Unsigned)
EPIC Encounter for ICM Monitoring  Patient Name: Stephen Jennings is a 67 y.o. male Date: 09/27/2022 Primary Care Physican: Ladell Pier, MD Primary Cardiologist: Kelly/Bensimhon Electrophysiologist: Curt Bears 08/24/2022 Weight: 194 lbs (baseline 194-195)                                                            Spoke with patient and heart failure questions reviewed.  Transmission results reviewed.  Pt asymptomatic for fluid accumulation.  Reports feeling well at this time and voices no complaints.     CorVue thoracic impedance suggesting normal fluid levels.    Prescribed:  Furosemide 20 mg take 1 tablet(s) (20 mg total) by mouth daily.   Eplerenone (inspra) 25 mg take 1 tablet(s) (25 mg total) by mouth daily   Labs: 09/12/2022 Creatinine 1.09, BUN 15, Potassium 3.9, Sodium 143, GFR 75 08/29/2022 Creatinine 1.01, BUN 14, Potassium 3.2, Sodium 141, GFR >60 08/12/2022 Creatinine 1.17, BUN 19, Potassium 3.4, Sodium 140, GFR >60 07/16/2022 Creatinine 0.86, BUN 11, Potassium 3.3, Sodium 141, GFR >60  04/29/2022 Creatinine 1.15, BUN 17, Potassium 4.6, Sodium 143, GFR 71  A complete set of results can be found in Results Review.   Recommendations:  No changes and encouraged to call if experiencing any fluid symptoms.   Follow-up plan: ICM clinic phone appointment on 10/31/2022.   91 day device clinic remote transmission 10/15/2022.     EP/Cardiology Office Visits:    Recall 12/10/2022 with Dr Haroldine Laws.  Recall 09/13/2023 with Dr Claiborne Billings.   Copy of ICM check sent to Dr. Curt Bears.     3 month ICM trend: 09/26/2022.    12-14 Month ICM trend:     [name], RN 09/27/2022 9:15 AM

## 2022-09-28 ENCOUNTER — Other Ambulatory Visit: Payer: Self-pay

## 2022-09-28 ENCOUNTER — Telehealth: Payer: Self-pay

## 2022-09-28 DIAGNOSIS — K219 Gastro-esophageal reflux disease without esophagitis: Secondary | ICD-10-CM

## 2022-09-28 MED ORDER — OMEPRAZOLE 20 MG PO CPDR: 20.0000 mg | DELAYED_RELEASE_CAPSULE | Freq: Every day | ORAL | 1 refills | Status: DC

## 2022-09-28 NOTE — Telephone Encounter (Signed)
Pt given lab results per notes of Dr. Wynetta Emery on 09/28/22. Pt verbalized understanding.Pt also needing refill of Omeprazole. RF request sent to [facility] med refill queue.

## 2022-09-28 NOTE — Telephone Encounter (Signed)
Requested medication (s) are due for refill today - expired Rx  Requested medication (s) are on the active medication list -yes  Future visit scheduled -yes  Last refill: 05/07/21 #30 11RF  Notes to clinic: expired Rx  Requested Prescriptions  Pending Prescriptions Disp Refills   omeprazole (PRILOSEC) 20 MG capsule 30 capsule 11    Sig: Take 1 capsule (20 mg total) by mouth daily.     Gastroenterology: Proton Pump Inhibitors Passed - 09/28/2022  8:19 AM      Passed - Valid encounter within last 12 months    Recent Outpatient Visits           1 month ago Type 2 diabetes mellitus with obesity ([place])   [place] Ladell Pier, MD   1 month ago Upper respiratory tract infection, unspecified type   [place], [place]   5 months ago Coronary artery disease involving native coronary artery of native heart without angina pectoris   [place], [place]   1 year ago Controlled type 2 diabetes mellitus with hyperglycemia, without long-term current use of insulin [facility])   [place], [place], [place]   1 year ago COPD with acute exacerbation [facility])   [place], [name]       Future Appointments             In 3 months Ladell Pier, MD [place]               Requested Prescriptions  Pending Prescriptions Disp Refills   omeprazole (PRILOSEC) 20 MG capsule 30 capsule 11    Sig: Take 1 capsule (20 mg total) by mouth daily.     Gastroenterology: Proton Pump Inhibitors Passed - 09/28/2022  8:19 AM      Passed - Valid encounter within last 12 months    Recent Outpatient Visits           1 month ago Type 2 diabetes mellitus with obesity [facility])   [place] Ladell Pier,  MD   1 month ago Upper respiratory tract infection, unspecified type   [place], [place]   5 months ago Coronary artery disease involving native coronary artery of native heart without angina pectoris   [place], MD   1 year ago Controlled type 2 diabetes mellitus with hyperglycemia, without long-term current use of insulin [facility])   [place], [place], [place]   1 year ago COPD with acute exacerbation [facility])   [place], [name]       Future Appointments             In 3 months Wynetta Emery, Dalbert Batman, MD [place]

## 2022-09-28 NOTE — Telephone Encounter (Signed)
Pt requesting refill of omeprazole to send to [facility] on [address].

## 2022-10-15 ENCOUNTER — Other Ambulatory Visit: Payer: Self-pay | Admitting: Internal Medicine

## 2022-10-15 DIAGNOSIS — G4709 Other insomnia: Secondary | ICD-10-CM

## 2022-10-17 NOTE — Telephone Encounter (Signed)
Requested Prescriptions  Pending Prescriptions Disp Refills   traZODone (DESYREL) 50 MG tablet [Pharmacy Med Name: traZODone HCl 50 MG Oral Tablet] 90 tablet 0    Sig: TAKE 1/2 TO 1 (ONE-HALF TO ONE) TABLET BY MOUTH EVERY DAY AT BEDTIME     Psychiatry: Antidepressants - Serotonin Modulator Passed - 10/15/2022  9:05 AM      Passed - Valid encounter within last 6 months    Recent Outpatient Visits           1 month ago Type 2 diabetes mellitus with obesity ([place])   [place] Karle Plumber B, MD   2 months ago Upper respiratory tract infection, unspecified type   [place], [place]   5 months ago Coronary artery disease involving native coronary artery of native heart without angina pectoris   [place], Deborah B, MD   1 year ago Controlled type 2 diabetes mellitus with hyperglycemia, without long-term current use of insulin [facility])   [place], [place], [place]   1 year ago COPD with acute exacerbation [facility])   [place], [place]       Future Appointments             In 2 months Wynetta Emery, Dalbert Batman, MD [place]

## 2022-10-19 ENCOUNTER — Ambulatory Visit: Payer: Medicare HMO

## 2022-10-19 DIAGNOSIS — I255 Ischemic cardiomyopathy: Secondary | ICD-10-CM | POA: Diagnosis not present

## 2022-10-19 LAB — CUP PACEART REMOTE DEVICE CHECK
Battery Remaining Longevity: 66 mo
Battery Remaining Percentage: 67 %
Battery Voltage: 2.98 V
Brady Statistic AP VP Percent: 1 %
Brady Statistic AP VS Percent: 18 %
Brady Statistic AS VP Percent: 1 %
Brady Statistic AS VS Percent: 82 %
Brady Statistic RA Percent Paced: 16 %
Brady Statistic RV Percent Paced: 1 %
Date Time Interrogation Session: 20240228040025
HighPow Impedance: 66 Ohm
HighPow Impedance: 66 Ohm
Implantable Lead Connection Status: 753985
Implantable Lead Connection Status: 753985
Implantable Lead Implant Date: 20200824
Implantable Lead Implant Date: 20200824
Implantable Lead Location: 753859
Implantable Lead Location: 753860
Implantable Pulse Generator Implant Date: 20200824
Lead Channel Impedance Value: 330 Ohm
Lead Channel Impedance Value: 340 Ohm
Lead Channel Pacing Threshold Amplitude: 0.5 V
Lead Channel Pacing Threshold Amplitude: 1.25 V
Lead Channel Pacing Threshold Pulse Width: 0.5 ms
Lead Channel Pacing Threshold Pulse Width: 0.5 ms
Lead Channel Sensing Intrinsic Amplitude: 1.1 mV
Lead Channel Sensing Intrinsic Amplitude: 11.2 mV
Lead Channel Setting Pacing Amplitude: 2 V
Lead Channel Setting Pacing Amplitude: 2.5 V
Lead Channel Setting Pacing Pulse Width: 0.5 ms
Lead Channel Setting Sensing Sensitivity: 0.5 mV
Pulse Gen Serial Number: 1300954

## 2022-10-31 ENCOUNTER — Ambulatory Visit: Payer: Medicare HMO | Attending: Cardiology

## 2022-10-31 ENCOUNTER — Telehealth: Payer: Self-pay

## 2022-10-31 DIAGNOSIS — Z95810 Presence of automatic (implantable) cardiac defibrillator: Secondary | ICD-10-CM | POA: Diagnosis not present

## 2022-10-31 DIAGNOSIS — I5022 Chronic systolic (congestive) heart failure: Secondary | ICD-10-CM

## 2022-10-31 NOTE — Progress Notes (Signed)
EPIC Encounter for ICM Monitoring  Patient Name: Stephen Jennings is a 67 y.o. male Date: 10/31/2022 Primary Care Physican: Ladell Pier, MD Primary Cardiologist: Kelly/Bensimhon Electrophysiologist: Curt Bears 08/24/2022 Weight: 194 lbs (baseline 194-195)                                                            Attempted call to patient and unable to reach.   Transmission reviewed.    CorVue thoracic impedance suggesting possible fluid accumulation starting 2/21 but trending closer to baseline within the last week.    Prescribed:  Furosemide 20 mg take 2 tablet(s) (40 mg total) by mouth daily.   Eplerenone (inspra) 25 mg take 1 tablet(s) (25 mg total) by mouth daily   Labs: 09/12/2022 Creatinine 1.09, BUN 15, Potassium 3.9, Sodium 143, GFR 75 08/29/2022 Creatinine 1.01, BUN 14, Potassium 3.2, Sodium 141, GFR >60 08/12/2022 Creatinine 1.17, BUN 19, Potassium 3.4, Sodium 140, GFR >60 A complete set of results can be found in Results Review.   Recommendations:  Unable to reach.     Follow-up plan: ICM clinic phone appointment on 11/14/2022 to recheck fluid levels.   91 day device clinic remote transmission 01/18/2023.     EP/Cardiology Office Visits:    Recall 12/10/2022 with Dr Haroldine Laws.  Recall 09/13/2023 with Dr Claiborne Billings.   Copy of ICM check sent to Dr. Curt Bears.     3 month ICM trend: 10/31/2022.    12-14 Month ICM trend:     [name], RN 10/31/2022 12:40 PM

## 2022-10-31 NOTE — Telephone Encounter (Signed)
Remote ICM transmission received.  Attempted call to patient regarding ICM remote transmission and left detailed message per DPR.  Advised to return call for any fluid symptoms or questions. Next ICM remote transmission scheduled 11/14/2022.    

## 2022-11-07 DIAGNOSIS — H52203 Unspecified astigmatism, bilateral: Secondary | ICD-10-CM | POA: Diagnosis not present

## 2022-11-07 DIAGNOSIS — Z7984 Long term (current) use of oral hypoglycemic drugs: Secondary | ICD-10-CM | POA: Diagnosis not present

## 2022-11-07 DIAGNOSIS — E1136 Type 2 diabetes mellitus with diabetic cataract: Secondary | ICD-10-CM | POA: Diagnosis not present

## 2022-11-07 DIAGNOSIS — H524 Presbyopia: Secondary | ICD-10-CM | POA: Diagnosis not present

## 2022-11-07 DIAGNOSIS — H25813 Combined forms of age-related cataract, bilateral: Secondary | ICD-10-CM | POA: Diagnosis not present

## 2022-11-07 DIAGNOSIS — H5213 Myopia, bilateral: Secondary | ICD-10-CM | POA: Diagnosis not present

## 2022-11-07 LAB — HM DIABETES EYE EXAM

## 2022-11-08 DIAGNOSIS — L821 Other seborrheic keratosis: Secondary | ICD-10-CM | POA: Diagnosis not present

## 2022-11-08 DIAGNOSIS — L814 Other melanin hyperpigmentation: Secondary | ICD-10-CM | POA: Diagnosis not present

## 2022-11-08 DIAGNOSIS — L905 Scar conditions and fibrosis of skin: Secondary | ICD-10-CM | POA: Diagnosis not present

## 2022-11-08 DIAGNOSIS — D225 Melanocytic nevi of trunk: Secondary | ICD-10-CM | POA: Diagnosis not present

## 2022-11-11 ENCOUNTER — Other Ambulatory Visit: Payer: Self-pay

## 2022-11-13 ENCOUNTER — Other Ambulatory Visit: Payer: Self-pay | Admitting: Internal Medicine

## 2022-11-13 DIAGNOSIS — G4709 Other insomnia: Secondary | ICD-10-CM

## 2022-11-14 ENCOUNTER — Ambulatory Visit: Payer: Medicare HMO | Attending: Cardiology

## 2022-11-14 DIAGNOSIS — Z95810 Presence of automatic (implantable) cardiac defibrillator: Secondary | ICD-10-CM

## 2022-11-14 DIAGNOSIS — I5022 Chronic systolic (congestive) heart failure: Secondary | ICD-10-CM

## 2022-11-15 NOTE — Progress Notes (Signed)
EPIC Encounter for ICM Monitoring  Patient Name: Stephen Jennings is a 67 y.o. male Date: 11/15/2022 Primary Care Physican: Ladell Pier, MD Primary Cardiologist: Kelly/Bensimhon Electrophysiologist: Curt Bears 08/24/2022 Weight: 194 lbs (baseline 194-195)                                                            Transmission reviewed.    CorVue thoracic impedance suggesting fluid levels returned to normal.    Prescribed:  Furosemide 20 mg take 2 tablet(s) (40 mg total) by mouth daily.   Eplerenone (inspra) 25 mg take 1 tablet(s) (25 mg total) by mouth daily   Labs: 09/12/2022 Creatinine 1.09, BUN 15, Potassium 3.9, Sodium 143, GFR 75 08/29/2022 Creatinine 1.01, BUN 14, Potassium 3.2, Sodium 141, GFR >60 08/12/2022 Creatinine 1.17, BUN 19, Potassium 3.4, Sodium 140, GFR >60 A complete set of results can be found in Results Review.   Recommendations:  No changes.    Follow-up plan: ICM clinic phone appointment on 12/05/2022.   91 day device clinic remote transmission 01/18/2023.     EP/Cardiology Office Visits:    Recall 12/10/2022 with Dr Haroldine Laws.  Recall 09/13/2023 with Dr Claiborne Billings.   Copy of ICM check sent to Dr. Curt Bears.     3 month ICM trend: 11/14/2022.    12-14 Month ICM trend:     [name], RN 11/15/2022 3:11 PM

## 2022-11-17 ENCOUNTER — Ambulatory Visit: Payer: Medicare HMO | Admitting: Internal Medicine

## 2022-11-21 NOTE — Progress Notes (Signed)
Remote ICD transmission.   

## 2022-11-22 NOTE — Telephone Encounter (Signed)
Encounter opened in error

## 2022-12-03 ENCOUNTER — Other Ambulatory Visit: Payer: Self-pay | Admitting: Pulmonary Disease

## 2022-12-03 DIAGNOSIS — J439 Emphysema, unspecified: Secondary | ICD-10-CM

## 2022-12-03 DIAGNOSIS — J329 Chronic sinusitis, unspecified: Secondary | ICD-10-CM

## 2022-12-04 ENCOUNTER — Other Ambulatory Visit (HOSPITAL_COMMUNITY): Payer: Self-pay | Admitting: Internal Medicine

## 2022-12-05 ENCOUNTER — Ambulatory Visit: Payer: Medicare HMO | Attending: Cardiology

## 2022-12-05 ENCOUNTER — Telehealth: Payer: Self-pay

## 2022-12-05 DIAGNOSIS — Z95810 Presence of automatic (implantable) cardiac defibrillator: Secondary | ICD-10-CM

## 2022-12-05 DIAGNOSIS — I5022 Chronic systolic (congestive) heart failure: Secondary | ICD-10-CM

## 2022-12-05 NOTE — Progress Notes (Unsigned)
EPIC Encounter for ICM Monitoring  Patient Name: Stephen Jennings is a 67 y.o. male Date: 12/05/2022 Primary Care Physican: Marcine Matar, MD Primary Cardiologist: Kelly/Bensimhon Electrophysiologist: Elberta Fortis 08/24/2022 Weight: 194 lbs (baseline 194-195)                                                            Attempted call to patient and unable to reach.  Left message to return call. Transmission reviewed.    CorVue thoracic impedance suggesting possible fluid accumulation starting 4/5 and trending back toward baseline 4/15.    Prescribed:  Furosemide 20 mg take 2 tablet(s) (40 mg total) by mouth daily.   Eplerenone (inspra) 25 mg take 1 tablet(s) (25 mg total) by mouth daily   Labs: 09/12/2022 Creatinine 1.09, BUN 15, Potassium 3.9, Sodium 143, GFR 75 08/29/2022 Creatinine 1.01, BUN 14, Potassium 3.2, Sodium 141, GFR >60 08/12/2022 Creatinine 1.17, BUN 19, Potassium 3.4, Sodium 140, GFR >60 A complete set of results can be found in Results Review.   Recommendations:  Unable to reach.     Follow-up plan: ICM clinic phone appointment on 12/12/2022 to recheck fluid levels.   91 day device clinic remote transmission 01/18/2023.     EP/Cardiology Office Visits:    Recall 12/10/2022 with Dr Gala Romney.  Recall 09/13/2023 with Dr Tresa Endo.   Copy of ICM check sent to Dr. Elberta Fortis.      3 month ICM trend: 12/05/2022.    12-14 Month ICM trend:     Karie Soda, RN 12/05/2022 10:21 AM

## 2022-12-05 NOTE — Telephone Encounter (Signed)
Remote ICM transmission received.  Attempted call to patient regarding ICM remote transmission and left message to return call   

## 2022-12-09 ENCOUNTER — Other Ambulatory Visit (HOSPITAL_COMMUNITY): Payer: Self-pay | Admitting: Internal Medicine

## 2022-12-12 ENCOUNTER — Ambulatory Visit: Payer: Medicare HMO | Attending: Cardiology

## 2022-12-12 DIAGNOSIS — I5022 Chronic systolic (congestive) heart failure: Secondary | ICD-10-CM

## 2022-12-12 DIAGNOSIS — Z95810 Presence of automatic (implantable) cardiac defibrillator: Secondary | ICD-10-CM

## 2022-12-14 ENCOUNTER — Telehealth: Payer: Self-pay | Admitting: Internal Medicine

## 2022-12-14 ENCOUNTER — Other Ambulatory Visit: Payer: Self-pay | Admitting: Pharmacist Clinician (PhC)/ Clinical Pharmacy Specialist

## 2022-12-14 ENCOUNTER — Telehealth: Payer: Self-pay | Admitting: Pharmacist

## 2022-12-14 ENCOUNTER — Other Ambulatory Visit: Payer: Self-pay

## 2022-12-14 NOTE — Telephone Encounter (Signed)
Called patient to schedule an appointment for medication management. He was identified by a [facility] quality report as failing medication adherence measures to his statin. I was unable to reach the patient. VM was full.   Of note, pt had an LDL of 36 on 07/27/2021. However, LDL check in 07/2022 showed an uncontrolled level of 161. This correlates with the [facility] non-adherence report. I called his [facility] pharmacy to inquire about this:   Pravastatin last 3 fills:  08/13/2022, 90-day supply. No rxns for pravastatin filled in 2024. No fills earlier in 2023.  Zetia last 3 fills:  11/14/2022, 90-day supply.  08/13/2022, 90-day supply.  10/21/2021, 90-day supply.   The above suggests non-adherence. Specifically, he only filled pravastatin in December, 2023. No other fills in 2023. I instructed [facility] to get a 90-day supply ready for him. Zetia appears to be current, however, you can see that he went without in 2023 ~6 months. This explains his LDL. I will forward this information to his Lipid team.

## 2022-12-14 NOTE — Progress Notes (Signed)
Leqvio orders placed 

## 2022-12-14 NOTE — Progress Notes (Signed)
EPIC Encounter for ICM Monitoring  Patient Name: Stephen Jennings is a 67 y.o. male Date: 12/14/2022 Primary Care Physican: Marcine Matar, MD Primary Cardiologist: Kelly/Bensimhon Electrophysiologist: Elberta Fortis 08/24/2022 Weight: 194 lbs (baseline 194-195)                                                            Transmission reviewed.    CorVue thoracic impedance suggesting fluid levels returned to baseline.    Prescribed:  Furosemide 20 mg take 2 tablet(s) (40 mg total) by mouth daily.   Eplerenone (inspra) 25 mg take 1 tablet(s) (25 mg total) by mouth daily   Labs: 09/12/2022 Creatinine 1.09, BUN 15, Potassium 3.9, Sodium 143, GFR 75 08/29/2022 Creatinine 1.01, BUN 14, Potassium 3.2, Sodium 141, GFR >60 08/12/2022 Creatinine 1.17, BUN 19, Potassium 3.4, Sodium 140, GFR >60 A complete set of results can be found in Results Review.   Recommendations:  No changes.   Follow-up plan: ICM clinic phone appointment on 01/09/2023.   91 day device clinic remote transmission 01/18/2023.     EP/Cardiology Office Visits:    Recall 12/10/2022 with Dr Gala Romney.  Recall 09/13/2023 with Dr Tresa Endo. Recall 09/23/2023 with Dr Elberta Fortis.   Copy of ICM check sent to Dr. Elberta Fortis.     3 month ICM trend: 12/12/2022.    12-14 Month ICM trend:     Karie Soda, RN 12/14/2022 10:12 AM

## 2022-12-14 NOTE — Telephone Encounter (Signed)
Copied from CRM [id number]. Topic: General - Other >> Dec 14, 2022  1:41 PM Everette C wrote: Reason for CRM: The patient would like to be contacted directly by Dr. Lois Huxley if/when possible  Please contact further when available

## 2022-12-15 ENCOUNTER — Other Ambulatory Visit: Payer: Self-pay | Admitting: Pulmonary Disease

## 2022-12-15 ENCOUNTER — Telehealth: Payer: Self-pay | Admitting: Pharmacy Technician

## 2022-12-15 DIAGNOSIS — J439 Emphysema, unspecified: Secondary | ICD-10-CM

## 2022-12-15 NOTE — Telephone Encounter (Signed)
Stephen Jennings note:  Patient will be scheduled as soon as possible  Auth Submission: APPROVED Site of care: Site of care: [facility] Payer: [employer] medicare Medication & CPT/J Code(s) submitted: Leqvio (Inclisiran) [phone] Route of submission (phone, fax, portal):  Phone # Fax # Auth type: Buy/Bill Units/visits requested: 2 Reference number: [id number] Approval from: 12/15/22 to 08/22/23    [facility] has been notified patient will need [employer].

## 2022-12-16 ENCOUNTER — Telehealth: Payer: Self-pay | Admitting: Pharmacist

## 2022-12-16 NOTE — Telephone Encounter (Signed)
Call placed. Left HIPAA-compliant VM with instructions to return my call.

## 2022-12-16 NOTE — Telephone Encounter (Signed)
Copied from CRM [id number]. Topic: General - Other >> Dec 16, 2022 11:57 AM Everette C wrote: Reason for CRM: The patient would like to be contacted again directly by Dr. Lois Huxley if/when possible   Please contact further when available

## 2022-12-26 NOTE — Telephone Encounter (Signed)
Called patient to schedule to address failed THN measures given the latest quality metric report. I was unable to reach the patient so I left a HIPAA-compliant message requesting that the patient return my call.   Of note, patient failed medication adherence measures as summarized by myself in a previous thread. I called his pharmacy same day and had them fill his pravastatin.

## 2022-12-29 ENCOUNTER — Encounter: Payer: Self-pay | Admitting: Cardiovascular Disease

## 2023-01-02 ENCOUNTER — Encounter: Payer: Self-pay | Admitting: Internal Medicine

## 2023-01-02 ENCOUNTER — Ambulatory Visit: Payer: Medicare HMO | Attending: Internal Medicine | Admitting: Internal Medicine

## 2023-01-02 VITALS — BP 136/79 | HR 59 | Temp 97.7°F | Ht 69.0 in | Wt 190.0 lb

## 2023-01-02 DIAGNOSIS — G4700 Insomnia, unspecified: Secondary | ICD-10-CM

## 2023-01-02 DIAGNOSIS — D649 Anemia, unspecified: Secondary | ICD-10-CM

## 2023-01-02 DIAGNOSIS — I5022 Chronic systolic (congestive) heart failure: Secondary | ICD-10-CM | POA: Diagnosis not present

## 2023-01-02 DIAGNOSIS — E1159 Type 2 diabetes mellitus with other circulatory complications: Secondary | ICD-10-CM

## 2023-01-02 DIAGNOSIS — E782 Mixed hyperlipidemia: Secondary | ICD-10-CM | POA: Diagnosis not present

## 2023-01-02 DIAGNOSIS — I2510 Atherosclerotic heart disease of native coronary artery without angina pectoris: Secondary | ICD-10-CM | POA: Diagnosis not present

## 2023-01-02 DIAGNOSIS — J439 Emphysema, unspecified: Secondary | ICD-10-CM | POA: Diagnosis not present

## 2023-01-02 DIAGNOSIS — Z794 Long term (current) use of insulin: Secondary | ICD-10-CM

## 2023-01-02 DIAGNOSIS — Z1211 Encounter for screening for malignant neoplasm of colon: Secondary | ICD-10-CM | POA: Diagnosis not present

## 2023-01-02 LAB — POCT GLYCOSYLATED HEMOGLOBIN (HGB A1C): HbA1c, POC (controlled diabetic range): 5.9 % (ref 0.0–7.0)

## 2023-01-02 LAB — GLUCOSE, POCT (MANUAL RESULT ENTRY): POC Glucose: 152 mg/dl — AB (ref 70–99)

## 2023-01-02 MED ORDER — MELATONIN 1 MG PO TABS: 1.0000 mg | ORAL_TABLET | Freq: Every day | ORAL | 1 refills | Status: DC

## 2023-01-02 NOTE — Progress Notes (Signed)
Patient ID: Stephen Jennings, male    DOB: Dec 17, 1955  MRN: [id number]  CC: Diabetes (DM f/u. Franchot Erichsen Trazadone - experiencing brain fog, requesting medication change/No to tdap vax. No to GI referral for colonoscopy)   Subjective: Stephen Jennings is a 67 y.o. male who presents for chronic ds management His concerns today include:  patient with history of CAD(NSTEMI with stent to LAD 03/2019), ICM with last EF 35-40% 07/2021, ICD present,  former smoker, COPD, DM 2, anemia (resolved on labs 10/21).     DM: Results for orders placed or performed in visit on 01/02/23  POCT glucose (manual entry)  Result Value Ref Range   POC Glucose 152 (A) 70 - 99 mg/dl  POCT glycosylated hemoglobin (Hb A1C)  Result Value Ref Range   Hemoglobin A1C     HbA1c POC (<> result, manual entry)     HbA1c, POC (prediabetic range)     HbA1c, POC (controlled diabetic range) 5.9 0.0 - 7.0 %  Currently on Jardiance 10 mg and tolerating it well. Loss 12 lbs since last visit which she states is intentional.  He attributes it to cutting out all soft drinks since last visit.  He states he feels so much better and does not feel as short of breath when he bends over to tie his shoes.  Wants to get down to 185 pounds.    COPD: Had a bad cold a few wks ago causing nasal congestion, little sneezing; thinks it was more allergies. Used home remedy by drinking tea that contained Ginger, honey and vinegar.  Feeling much better now. Stable on Breztri, uses Albuterol every now and then.  Using Claritin, Flonase and Singulair.  Also uses Neti pot a few times a wk  CAD/CHF: -Currently on Entresto 97/103 mg twice a day, bisoprolol 5 mg daily, furosemide 40 mg daily, Brilinta 90 mg daily, pravastatin 80 mg daily and Zetia. -Last lipid profile done 07/2022 showed LDL of 161 clinical pharmacist had tried to contact him several weeks ago as it was identified on a [facility] quality report that he was failing medication adherence with Pravachol.   Since then he has refilled the medication.  However he reports muscle cramps in both calfs especially first thing in the mornings.  On last visit with Dr. Tresa Endo 08/2022, it was noted that patient is intolerant of high-dose statins and the plan was to refer him to lipid clinic to be considered for Inclisiran.  However, patient states he was never called with an appointment. No CP, use of SL Nitro, LE edema, PND  Anemia: On last CBC done the end of November, he was shown to have mild normocytic anemia with H/H of 12.4/37.3.  Previously levels were 13.6/41.  He denies any blood in the stools or dark stools.  No hematuria.  He has had some weight loss which he states is intentional.  Cologuard test ordered last year but he has not turned it in.  He tells me that he plans to do a colonoscopy instead sometime within the next several months.  He is currently taking care of his wife who was diagnosed with colon cancer 2 months ago.  He is having to take her and stay with her for her treatments.  He states that once things has come down for her, he plans to pursue colonoscopy.  Insomnia:  Has been on Trazodone 50 mg 1/2 pill x 2 yrs.  However he would like to change it to something different if possible  because it is causing him to feel "foggy" in the mornings.    Patient Active Problem List   Diagnosis Date Noted   Controlled type 2 diabetes mellitus with hyperglycemia, without long-term current use of insulin (HCC) 08/29/2022   Hyperlipidemia 02/06/2021   Elevated troponin    NSTEMI (non-ST elevated myocardial infarction) (HCC) 02/01/2021   Non-seasonal allergic rhinitis 01/20/2021   Pulmonary emphysema (HCC) 12/03/2020   SOB (shortness of breath) 12/02/2020   History of tobacco abuse 12/02/2020   Chronic systolic heart failure (HCC) 10/05/2020   Coronary artery disease of native artery of native heart with stable angina pectoris (HCC) 10/05/2020   Gastroesophageal reflux disease without esophagitis  10/05/2020   Prediabetes 10/05/2020   Essential hypertension 06/08/2020   Obesity (BMI 30.0-34.9) 06/08/2020   Snoring 11/28/2019   Morning headache 11/28/2019   Chronic daily headache 10/08/2019   Coronary artery disease involving left main coronary artery 05/08/2019   History of placement of stent in LAD coronary artery 05/08/2019   Diabetes mellitus type II, controlled (HCC) 05/08/2019     Current Outpatient Medications on File Prior to Visit  Medication Sig Dispense Refill   Accu-Chek Softclix Lancets lancets Use to measure blood sugar twice a day 100 each 6   albuterol (PROVENTIL) (2.5 MG/3ML) 0.083% nebulizer solution Take 3 mLs (2.5 mg total) by nebulization every 6 (six) hours as needed for wheezing or shortness of breath. 75 mL 12   albuterol (VENTOLIN HFA) 108 (90 Base) MCG/ACT inhaler INHALE 2 PUFFS BY MOUTH EVERY 6 HOURS AS NEEDED FOR WHEEZING FOR SHORTNESS OF BREATH 18 g 0   benzonatate (TESSALON) 100 MG capsule Take 2 capsules (200 mg total) by mouth 3 (three) times daily as needed. For cough 40 capsule 0   bisoprolol (ZEBETA) 5 MG tablet Take 1 tablet (5 mg total) by mouth daily. 30 tablet 11   Blood Glucose Monitoring Suppl (ACCU-CHEK GUIDE ME) w/Device KIT 1 kit by Does not apply route in the morning and at bedtime. Use to measure blood sugar twice a day 1 kit 0   BRILINTA 90 MG TABS tablet Take 1 tablet by mouth twice daily 180 tablet 0   Budeson-Glycopyrrol-Formoterol (BREZTRI AEROSPHERE) 160-9-4.8 MCG/ACT AERO Inhale 2 puffs into the lungs 2 (two) times daily. 10.7 g 11   chlorpheniramine (CHLOR-TRIMETON) 4 MG tablet 1 tab tid prn runny nose 40 tablet 0   co-enzyme Q-10 30 MG capsule Take 1 capsule (30 mg total) by mouth daily. 90 capsule 3   empagliflozin (JARDIANCE) 10 MG TABS tablet Take 1 tablet (10 mg total) by mouth daily before breakfast. 90 tablet 3   eplerenone (INSPRA) 25 MG tablet Take 1 tablet (25 mg total) by mouth daily. 90 tablet 3   ezetimibe (ZETIA) 10  MG tablet Take 1 tablet (10 mg total) by mouth daily. 90 tablet 3   fluticasone (FLONASE) 50 MCG/ACT nasal spray USE 1 SPRAY(S) IN EACH NOSTRIL ONCE DAILY AS NEEDED FOR ALLERGIES OR  RHINITIS 48 g 0   furosemide (LASIX) 20 MG tablet Take 2 tablets (40 mg total) by mouth daily. 180 tablet 3   glucose blood (ACCU-CHEK GUIDE) test strip Use to measure blood sugar twice a day 100 each 6   Hyprom-Naphaz-Polysorb-Zn Sulf (CLEAR EYES COMPLETE) SOLN Apply 1 drop to eye 3 (three) times daily. 15 mL 1   loratadine (CLARITIN) 10 MG tablet Take 1 tablet (10 mg total) by mouth daily. 30 tablet 11   montelukast (SINGULAIR) 10 MG tablet TAKE  1 TABLET BY MOUTH AT BEDTIME 30 tablet 2   nitroGLYCERIN (NITROSTAT) 0.4 MG SL tablet Place 1 tablet (0.4 mg total) under the tongue every 5 (five) minutes x 3 doses as needed for chest pain. 25 tablet 3   omeprazole (PRILOSEC) 20 MG capsule Take 1 capsule (20 mg total) by mouth daily. 90 capsule 1   pravastatin (PRAVACHOL) 80 MG tablet Take 1 tablet (80 mg total) by mouth every evening. 90 tablet 3   sacubitril-valsartan (ENTRESTO) 97-103 MG Take 1 tablet by mouth 2 (two) times daily. 180 tablet 3   sodium zirconium cyclosilicate (LOKELMA) 5 g packet Take 10 g by mouth daily. 30 each 11   No current facility-administered medications on file prior to visit.    Allergies  Allergen Reactions   Atorvastatin     Muscle weakness and fatigue on 80mg  daily   Spironolactone     gynecomastia   Metformin And Related Anxiety and Palpitations   Other Anxiety and Palpitations    Social History   Socioeconomic History   Marital status: Married    Spouse name: Not on file   Number of children: 1   Years of education: 8   Highest education level: 8th grade  Occupational History   Not on file  Tobacco Use   Smoking status: Former    Types: Cigarettes    Quit date: 04/08/2019    Years since quitting: 3.7    Passive exposure: Current (wife smokes)   Smokeless tobacco:  Never  Vaping Use   Vaping Use: Never used  Substance and Sexual Activity   Alcohol use: No   Drug use: No   Sexual activity: Not Currently  Other Topics Concern   Not on file  Social History Narrative   Lives with wife   Right handed   Caffeine: about 2 cups of coffee every morning, no soda   Social Determinants of Health   Financial Resource Strain: Low Risk  (03/14/2022)   Overall Financial Resource Strain (CARDIA)    Difficulty of Paying Living Expenses: Not very hard  Food Insecurity: No Food Insecurity (03/14/2022)   Hunger Vital Sign    Worried About Running Out of Food in the Last Year: Never true    Ran Out of Food in the Last Year: Never true  Transportation Needs: No Transportation Needs (07/20/2022)   PRAPARE - Administrator, Civil Service (Medical): No    Lack of Transportation (Non-Medical): No  Physical Activity: Sufficiently Active (03/14/2022)   Exercise Vital Sign    Days of Exercise per Week: 7 days    Minutes of Exercise per Session: 60 min  Stress: No Stress Concern Present (03/14/2022)   [employer] of Occupational Health - Occupational Stress Questionnaire    Feeling of Stress : Not at all  Social Connections: Moderately Isolated (03/14/2022)   Social Connection and Isolation Panel [NHANES]    Frequency of Communication with Friends and Family: More than three times a week    Frequency of Social Gatherings with Friends and Family: More than three times a week    Attends Religious Services: Never    Database administrator or Organizations: No    Attends Banker Meetings: Never    Marital Status: Married  Catering manager Violence: Not At Risk (03/14/2022)   Humiliation, Afraid, Rape, and Kick questionnaire    Fear of Current or Ex-Partner: No    Emotionally Abused: No    Physically Abused: No  Sexually Abused: No    Family History  Problem Relation Age of Onset   Hypertension Mother    Heart failure Mother     Headache Neg Hx    Migraines Neg Hx     Past Surgical History:  Procedure Laterality Date   CARDIAC CATHETERIZATION     CORONARY BALLOON ANGIOPLASTY N/A 02/04/2021   Procedure: CORONARY BALLOON ANGIOPLASTY;  Surgeon: Lennette Bihari, MD;  Location: [facility] INVASIVE CV LAB;  Service: Cardiovascular;  Laterality: N/A;   CORONARY THROMBECTOMY N/A 02/04/2021   Procedure: Coronary Thrombectomy;  Surgeon: Lennette Bihari, MD;  Location: [facility] INVAS[facility] CV LAB;  Service: Cardiovascular;  Laterality: N/A;   CORONARY ULTRASOUND/IVUS N/A 02/04/2021   Procedure: Intravascular Ultrasound/IVUS;  Surgeon: Lennette Bihari, MD;  Location: [facility] INVASIVE CV LAB;  Service: Cardiovascular;  Laterality: N/A;   CORONARY/GRAFT ACUTE MI REVASCULARIZATION N/A 03/31/2019   Procedure: Coronary/Graft Acute MI Revascularization;  Surgeon: Lennette Bihari, MD;  Location: [facility] INVASIVE CV LAB;  Service: Cardiovascular;  Laterality: N/A;   ICD IMPLANT N/A 04/15/2019   Procedure: ICD IMPLANT;  Surgeon: Regan Lemming, MD;  Location: [facility] INVASIVE CV LAB;  Service: Cardiovascular;  Laterality: N/A;   LEFT HEART CATH AND CORONARY ANGIOGRAPHY N/A 03/31/2019   Procedure: LEFT HEART CATH AND CORONARY ANGIOGRAPHY;  Surgeon: Lennette Bihari, MD;  Location: [facility] INVASIVE CV LAB;  Service: Cardiovascular;  Laterality: N/A;   LEFT HEART CATH AND CORONARY ANGIOGRAPHY N/A 02/02/2021   Procedure: LEFT HEART CATH AND CORONARY ANGIOGRAPHY;  Surgeon: Lennette Bihari, MD;  Location: [facility] INVASIVE CV LAB;  Service: Cardiovascular;  Laterality: N/A;   LEFT HEART CATH AND CORONARY ANGIOGRAPHY N/A 02/04/2021   Procedure: LEFT HEART CATH AND CORONARY ANGIOGRAPHY;  Surgeon: Lennette Bihari, MD;  Location: [facility] INVASIVE CV LAB;  Service: Cardiovascular;  Laterality: N/A;   NO PAST SURGERIES     RIGHT HEART CATH N/A 03/31/2019   Procedure: RIGHT HEART CATH;  Surgeon: Lennette Bihari, MD;  Location: [facility] INVASIVE CV LAB;  Service: Cardiovascular;  Laterality: N/A;    VENTRICULAR ASSIST DEVICE INSERTION N/A 03/31/2019   Procedure: VENTRICULAR ASSIST DEVICE INSERTION;  Surgeon: Lennette Bihari, MD;  Location: [facility] INVASIVE CV LAB;  Service: Cardiovascular;  Laterality: N/A;    ROS: Review of Systems Negative except as stated above  PHYSICAL EXAM: BP 136/79 (BP Location: Left Arm, Patient Position: Sitting, Cuff Size: Normal)   Pulse (!) 59   Temp 97.7 F (36.5 C) (Oral)   Ht 5\' 9"  (1.753 m)   Wt 190 lb (86.2 kg)   SpO2 98%   BMI 28.06 kg/m   Wt Readings from Last 3 Encounters:  01/02/23 190 lb (86.2 kg)  09/13/22 199 lb 6.4 oz (90.4 kg)  09/12/22 202 lb (91.6 kg)    Physical Exam   General appearance - alert, well appearing, older Caucasian male and in no distress Mental status - normal mood, behavior, speech, dress, motor activity, and thought processes Neck - supple, no significant adenopathy Chest -breath sounds moderately decreased but equal bilaterally.  No wheezes or crackles heard. Heart - normal rate, regular rhythm, normal S1, S2, no murmurs, rubs, clicks or gallops Extremities -no lower extremity edema.     Latest Ref Rng & Units 09/12/2022    8:21 AM 08/29/2022   10:01 AM 08/12/2022   10:56 AM  CMP  Glucose 70 - 99 mg/dL 161  096  045   BUN 8 - 27 mg/dL 15  14  19   Creatinine 0.76 - 1.27 mg/dL 4.03  4.74  2.59   Sodium 134 - 144 mmol/L 143  141  140   Potassium 3.5 - 5.2 mmol/L 3.9  3.2  3.4   Chloride 96 - 106 mmol/L 101  102  104   CO2 20 - 29 mmol/L 28  31  27    Calcium 8.6 - 10.2 mg/dL 9.3  8.9  9.0    Lipid Panel     Component Value Date/Time   CHOL 213 (H) 08/12/2022 1056   CHOL 117 11/16/2020 0912   TRIG 71 08/12/2022 1056   HDL 38 (L) 08/12/2022 1056   HDL 37 (L) 11/16/2020 0912   CHOLHDL 5.6 08/12/2022 1056   VLDL 14 08/12/2022 1056   LDLCALC 161 (H) 08/12/2022 1056   LDLCALC 66 11/16/2020 0912    CBC    Component Value Date/Time   WBC 9.1 07/16/2022 2240   RBC 4.16 (L) 07/16/2022 2240   HGB 12.4 (L)  07/16/2022 2240   HGB 13.1 04/23/2021 1025   HCT 37.3 (L) 07/16/2022 2240   HCT 39.9 04/23/2021 1025   PLT 185 07/16/2022 2240   PLT 258 04/23/2021 1025   MCV 89.7 07/16/2022 2240   MCV 90 04/23/2021 1025   MCH 29.8 07/16/2022 2240   MCHC 33.2 07/16/2022 2240   RDW 14.5 07/16/2022 2240   RDW 14.1 04/23/2021 1025   LYMPHSABS 0.9 07/16/2022 2240   MONOABS 0.9 07/16/2022 2240   EOSABS 0.1 07/16/2022 2240   BASOSABS 0.0 07/16/2022 2240    ASSESSMENT AND PLAN:  1. Type 2 diabetes mellitus with other circulatory complication, with long-term current use of insulin (HCC) At goal.  Continue Jardiance and healthy eating habits. - POCT glucose (manual entry) - POCT glycosylated hemoglobin (Hb A1C)  2. Pulmonary emphysema, unspecified emphysema type (HCC) Reports mild flare about 2 weeks ago due to URI and allergies.  Feels like he is back to baseline now.  He will continue his Breztri inhaler and allergy medications listed above.  3. Coronary artery disease involving native coronary artery of native heart without angina pectoris Stable. He reports muscle cramps with statin therapy and Dr. Landry Dyke plan was to refer him to lipid clinic to be considered for other medication.  I will go ahead and submit that referral for him. Continue bisoprolol and Brilinta.  4. CHF (congestive heart failure), NYHA class II, chronic, systolic (HCC) Stable and compensated.  Echo was ordered by cardiology NP in December but patient states he was never called.  I will resubmit that order today Patient will continue Jardiance, bisoprolol, Entresto and furosemide. - ECHOCARDIOGRAM COMPLETE; Future  5. Insomnia, unspecified type Stop trazodone.  Will try him with low-dose melatonin instead. - melatonin 1 MG TABS tablet; Take 1 tablet (1 mg total) by mouth at bedtime.  Dispense: 30 tablet; Refill: 1  6. Mixed hyperlipidemia See #3 above. - AMB Referral to [facility] - Lipid panel -  Hepatic Function Panel  7. Normocytic anemia We will recheck CBC today with iron studies. Strongly encouraged him to get the colonoscopy done sooner rather than later.  He does not want me to order it just yet stating that he is busy taking care of his wife who was recently diagnosed with colon cancer. - CBC - Iron, TIBC and Ferritin Panel  8. Screening for colon cancer See #7 above.    Patient was given the opportunity to ask questions.  Patient verbalized understanding of the  plan and was able to repeat key elements of the plan.   This documentation was completed using Paediatric nurse.  Any transcriptional errors are unintentional.  Orders Placed This Encounter  Procedures   CBC   Iron, TIBC and Ferritin Panel   Lipid panel   Hepatic Function Panel   AMB Referral to [facility]   POCT glucose (manual entry)   POCT glycosylated hemoglobin (Hb A1C)   ECHOCARDIOGRAM COMPLETE     Requested Prescriptions   Signed Prescriptions Disp Refills   melatonin 1 MG TABS tablet 30 tablet 1    Sig: Take 1 tablet (1 mg total) by mouth at bedtime.    Return in about 4 months (around 05/05/2023) for Give appt with CMA after 02/12/2023 for Medicare Welnness.  Jonah Blue, MD, FACP

## 2023-01-03 LAB — HEPATIC FUNCTION PANEL
ALT: 21 IU/L (ref 0–44)
AST: 25 IU/L (ref 0–40)
Albumin: 4.5 g/dL (ref 3.9–4.9)
Alkaline Phosphatase: 108 IU/L (ref 44–121)
Bilirubin Total: 0.8 mg/dL (ref 0.0–1.2)
Bilirubin, Direct: 0.2 mg/dL (ref 0.00–0.40)
Total Protein: 7 g/dL (ref 6.0–8.5)

## 2023-01-03 LAB — LIPID PANEL
Chol/HDL Ratio: 4.4 ratio (ref 0.0–5.0)
Cholesterol, Total: 182 mg/dL (ref 100–199)
HDL: 41 mg/dL (ref >39)
LDL Chol Calc (NIH): 122 mg/dL — ABNORMAL HIGH (ref 0–99)
Triglycerides: 106 mg/dL (ref 0–149)
VLDL Cholesterol Cal: 19 mg/dL (ref 5–40)

## 2023-01-03 LAB — CBC
Hematocrit: 48.1 % (ref 37.5–51.0)
Hemoglobin: 15.6 g/dL (ref 13.0–17.7)
MCH: 29.4 pg (ref 26.6–33.0)
MCHC: 32.4 g/dL (ref 31.5–35.7)
MCV: 91 fL (ref 79–97)
Platelets: 288 10*3/uL (ref 150–450)
RBC: 5.31 x10E6/uL (ref 4.14–5.80)
RDW: 13.2 % (ref 11.6–15.4)
WBC: 7.7 10*3/uL (ref 3.4–10.8)

## 2023-01-03 LAB — IRON,TIBC AND FERRITIN PANEL
Ferritin: 80 ng/mL (ref 30–400)
Iron Saturation: 23 % (ref 15–55)
Iron: 80 ug/dL (ref 38–169)
Total Iron Binding Capacity: 348 ug/dL (ref 250–450)
UIBC: 268 ug/dL (ref 111–343)

## 2023-01-05 ENCOUNTER — Other Ambulatory Visit: Payer: Self-pay | Admitting: Pulmonary Disease

## 2023-01-05 ENCOUNTER — Encounter: Payer: Self-pay | Admitting: Cardiovascular Disease

## 2023-01-05 ENCOUNTER — Other Ambulatory Visit (HOSPITAL_COMMUNITY): Payer: Self-pay

## 2023-01-05 DIAGNOSIS — J329 Chronic sinusitis, unspecified: Secondary | ICD-10-CM

## 2023-01-05 DIAGNOSIS — J439 Emphysema, unspecified: Secondary | ICD-10-CM

## 2023-01-09 ENCOUNTER — Telehealth: Payer: Self-pay

## 2023-01-09 ENCOUNTER — Ambulatory Visit: Payer: Medicare HMO | Attending: Cardiology

## 2023-01-09 DIAGNOSIS — I5022 Chronic systolic (congestive) heart failure: Secondary | ICD-10-CM | POA: Diagnosis not present

## 2023-01-09 DIAGNOSIS — Z95810 Presence of automatic (implantable) cardiac defibrillator: Secondary | ICD-10-CM | POA: Diagnosis not present

## 2023-01-09 NOTE — Telephone Encounter (Signed)
Remote ICM transmission received.  Attempted call to patient regarding ICM remote transmission and left message per DPR to return call.   

## 2023-01-09 NOTE — Progress Notes (Signed)
EPIC Encounter for ICM Monitoring  Patient Name: Stephen Jennings is a 67 y.o. male Date: 01/09/2023 Primary Care Physican: Marcine Matar, MD Primary Cardiologist: Kelly/Bensimhon Electrophysiologist: Elberta Fortis 08/24/2022 Weight: 194 lbs (baseline 194-195)                                                            Attempted call to patient and unable to reach.  Left message to return call. Transmission reviewed.     CorVue thoracic impedance suggesting possible fluid accumulation starting 5/17.    Prescribed:  Furosemide 20 mg take 2 tablet(s) (40 mg total) by mouth daily.   Eplerenone (inspra) 25 mg take 1 tablet(s) (25 mg total) by mouth daily   Labs: 09/12/2022 Creatinine 1.09, BUN 15, Potassium 3.9, Sodium 143, GFR 75 08/29/2022 Creatinine 1.01, BUN 14, Potassium 3.2, Sodium 141, GFR >60 08/12/2022 Creatinine 1.17, BUN 19, Potassium 3.4, Sodium 140, GFR >60 A complete set of results can be found in Results Review.   Recommendations: Unable to reach.     Follow-up plan: ICM clinic phone appointment on 01/18/2023 to recheck fluid levels.   91 day device clinic remote transmission 01/18/2023.     EP/Cardiology Office Visits:    Recall 12/10/2022 with Dr Gala Romney.  Recall 09/13/2023 with Dr Tresa Endo.   Recall 09/23/2023 with Dr Elberta Fortis.   Copy of ICM check sent to Dr. Elberta Fortis.   Will send copy to HF clinic for review if patient is reached.     3 month ICM trend: 01/09/2023.    12-14 Month ICM trend:     Karie Soda, RN 01/09/2023 7:33 AM

## 2023-01-14 ENCOUNTER — Other Ambulatory Visit: Payer: Self-pay | Admitting: Pulmonary Disease

## 2023-01-14 DIAGNOSIS — R0609 Other forms of dyspnea: Secondary | ICD-10-CM

## 2023-01-17 ENCOUNTER — Telehealth: Payer: Self-pay

## 2023-01-17 ENCOUNTER — Ambulatory Visit: Payer: Medicare HMO | Attending: Cardiology

## 2023-01-17 DIAGNOSIS — Z95810 Presence of automatic (implantable) cardiac defibrillator: Secondary | ICD-10-CM

## 2023-01-17 DIAGNOSIS — I5022 Chronic systolic (congestive) heart failure: Secondary | ICD-10-CM

## 2023-01-17 NOTE — Telephone Encounter (Signed)
Remote ICM transmission received.  Attempted call to patient regarding ICM remote transmission and left message to return call   

## 2023-01-17 NOTE — Progress Notes (Unsigned)
EPIC Encounter for ICM Monitoring  Patient Name: Stephen Jennings is a 66 y.o. male Date: 01/17/2023 Primary Care Physican: Marcine Matar, MD Primary Cardiologist: Kelly/Bensimhon Electrophysiologist: Elberta Fortis 08/24/2022 Weight: 194 lbs (baseline 194-195)                                                            Attempted call to patient and unable to reach.  Left message to return call. Transmission reviewed.    CorVue thoracic impedance suggesting possible fluid accumulation starting 5/17.    Prescribed:  Furosemide 20 mg take 2 tablet(s) (40 mg total) by mouth daily.   Eplerenone (inspra) 25 mg take 1 tablet(s) (25 mg total) by mouth daily   Labs: 09/12/2022 Creatinine 1.09, BUN 15, Potassium 3.9, Sodium 143, GFR 75 08/29/2022 Creatinine 1.01, BUN 14, Potassium 3.2, Sodium 141, GFR >60 08/12/2022 Creatinine 1.17, BUN 19, Potassium 3.4, Sodium 140, GFR >60 A complete set of results can be found in Results Review.   Recommendations: Unable to reach.    Will send copy to HF clinic for review if patient is reached.     Follow-up plan: ICM clinic phone appointment on 01/24/2023 to recheck fluid levels.   91 day device clinic remote transmission 01/18/2023.     EP/Cardiology Office Visits:    Recall 12/10/2022 with Dr Gala Romney (4 mo w/echo).  Recall 09/13/2023 with Dr Tresa Endo.   Recall 09/23/2023 with Dr Elberta Fortis.   Copy of ICM check sent to Dr. Elberta Fortis.       3 month ICM trend: 01/17/2023.    12-14 Month ICM trend:     Karie Soda, RN 01/17/2023 7:15 AM

## 2023-01-18 ENCOUNTER — Ambulatory Visit: Payer: Medicare HMO

## 2023-01-18 NOTE — Progress Notes (Signed)
Attempted call to patient and unable to reach.    

## 2023-01-23 ENCOUNTER — Ambulatory Visit: Payer: Medicare HMO

## 2023-01-23 MED ORDER — INCLISIRAN SODIUM 284 MG/1.5ML ~~LOC~~ SOSY: 284.0000 mg | PREFILLED_SYRINGE | Freq: Once | SUBCUTANEOUS | Status: DC

## 2023-01-24 ENCOUNTER — Ambulatory Visit: Payer: Medicare HMO | Attending: Cardiology

## 2023-01-24 DIAGNOSIS — I5022 Chronic systolic (congestive) heart failure: Secondary | ICD-10-CM

## 2023-01-24 DIAGNOSIS — Z95810 Presence of automatic (implantable) cardiac defibrillator: Secondary | ICD-10-CM

## 2023-01-24 NOTE — Progress Notes (Signed)
EPIC Encounter for ICM Monitoring  Patient Name: Stephen Jennings is a 67 y.o. male Date: 01/24/2023 Primary Care Physican: Marcine Matar, MD Primary Cardiologist: Kelly/Bensimhon Electrophysiologist: Elberta Fortis 08/24/2022 Weight: 194 lbs (baseline 194-195) 01/24/2023 Weight: 188-190 lbs                                                            Spoke with patient and heart failure questions reviewed.  Transmission results reviewed.  Pt asymptomatic for fluid accumulation but did have complaints of diarrhea.  He took extra Furosemide for a couple of days to get rid of the fluid.     CorVue thoracic impedance suggesting fluid levels returned to normal.    Prescribed:  Furosemide 20 mg take 2 tablet(s) (40 mg total) by mouth daily.   Eplerenone (inspra) 25 mg take 1 tablet(s) (25 mg total) by mouth daily   Labs: 09/12/2022 Creatinine 1.09, BUN 15, Potassium 3.9, Sodium 143, GFR 75 08/29/2022 Creatinine 1.01, BUN 14, Potassium 3.2, Sodium 141, GFR >60 08/12/2022 Creatinine 1.17, BUN 19, Potassium 3.4, Sodium 140, GFR >60 A complete set of results can be found in Results Review.   Recommendations:  No changes and encouraged to call if experiencing any fluid symptoms.    Follow-up plan: ICM clinic phone appointment on 02/13/2023.   91 day device clinic remote transmission 01/18/2023.     EP/Cardiology Office Visits:    Recall 12/10/2022 with Dr Gala Romney (4 mo w/echo).  Recall 09/13/2023 with Dr Tresa Endo.   Recall 09/23/2023 with Dr Elberta Fortis.   Copy of ICM check sent to Dr. Elberta Fortis.       3 month ICM trend: 01/23/2023.    12-14 Month ICM trend:     Karie Soda, RN 01/24/2023 11:51 AM

## 2023-01-25 ENCOUNTER — Encounter: Payer: Self-pay | Admitting: Cardiovascular Disease

## 2023-01-25 ENCOUNTER — Ambulatory Visit: Payer: Medicare HMO

## 2023-01-25 MED ORDER — INCLISIRAN SODIUM 284 MG/1.5ML ~~LOC~~ SOSY: 284.0000 mg | PREFILLED_SYRINGE | Freq: Once | SUBCUTANEOUS | Status: DC

## 2023-01-27 ENCOUNTER — Encounter: Payer: Self-pay | Admitting: Cardiovascular Disease

## 2023-02-03 ENCOUNTER — Ambulatory Visit (INDEPENDENT_AMBULATORY_CARE_PROVIDER_SITE_OTHER): Payer: Medicare HMO | Admitting: Pulmonary Disease

## 2023-02-03 ENCOUNTER — Encounter: Payer: Self-pay | Admitting: Pulmonary Disease

## 2023-02-03 DIAGNOSIS — J069 Acute upper respiratory infection, unspecified: Secondary | ICD-10-CM | POA: Diagnosis not present

## 2023-02-03 DIAGNOSIS — R0609 Other forms of dyspnea: Secondary | ICD-10-CM

## 2023-02-03 MED ORDER — BREZTRI AEROSPHERE 160-9-4.8 MCG/ACT IN AERO: 2.0000 | INHALATION_SPRAY | Freq: Two times a day (BID) | RESPIRATORY_TRACT | 11 refills | Status: DC

## 2023-02-03 MED ORDER — BENZONATATE 200 MG PO CAPS: 200.0000 mg | ORAL_CAPSULE | Freq: Three times a day (TID) | ORAL | 6 refills | Status: DC | PRN

## 2023-02-03 NOTE — Patient Instructions (Signed)
I refilled Breztri for 1 year  I put in refills for the cough Perles  Return to clinic in 1 year or sooner as needed.

## 2023-02-03 NOTE — Progress Notes (Signed)
@Patient  ID: Stephen Jennings, male    DOB: 1956/04/12, 67 y.o.   MRN: [id number]  Chief Complaint  Patient presents with   Follow-up    Breathing is ok      Referring provider: Marcine Matar, MD  HPI:   67 y.o. 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  Most recent cardiology note x2 reviewed.  Most recent PCP note reviewed.  Overall, his breathing continues to do well on [place].  No exacerbations.  Breathing good.  Occasional coughing fits but treated well with Tessalon Perles.  HPI initial visit: Patient notes several weeks ago he felt ill.  Cough, sore throat, runny nose.  Felt like his virus.  Felt more rundown, short of breath during that time.  Gradually symptoms seem to slowly improve.  However had residual dyspnea on exertion.  Worse with inclines or stairs.  Better with rest.  No environmental or seasonal changes he can identify to contribute.  Did not really try much to make things better or worse.  Cannot identify clear exacerbating or alleviating factors.  Also/13, he developed chest pain with radiation down left arm while doing yard work.  Presented to the ED. Troponins elevated. Medical therapy for NSTEMI started. LHC 6/14 with narrowing in prior stent in LAD. Placed on aggrastat and bolused brilinta. Repeat LHC 6/16 with improved narrowing/thrombus in prior stent. TTE with new reduced EF compared to 10/2020. Diuresed, and started on lasix daily. He states this has helped immensely with DOE. Denies any respiratory issues. Denies any limitations due to dyspnea.  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  Working without issue.  CXR 02/01/21 on my review and interpretation with hyperinflation on lateral view, bronchitic changes bilaterally.  PMH: tobacco abuse in remission, CAD s/p DES Surgical history: cardiac cath Family History: CHF, HTN in mom Social history: former smoker, quit 2020 at time of  first heart attack, 40 pack year history, lives in [place] / Pulmonary Flowsheets:   ACT:      No data to display           MMRC:     No data to display           Epworth:      No data to display           Tests:   FENO:  No results found for: "NITRICOXIDE"  PFT:    Latest Ref Rng & Units 10/07/2021    9:44 AM  PFT Results  FVC-Pre L 2.77   FVC-Predicted Pre % 60   FVC-Post L 3.36   FVC-Predicted Post % 73   Pre FEV1/FVC % % 58   Post FEV1/FCV % % 62   FEV1-Pre L 1.62   FEV1-Predicted Pre % 47   FEV1-Post L 2.07   DLCO uncorrected ml/min/mmHg 20.92   DLCO UNC% % 78   DLCO corrected ml/min/mmHg 21.56   DLCO COR %Predicted % 80   DLVA Predicted % 82   TLC L 8.34   TLC % Predicted % 118   RV % Predicted % 227   Personally reviewed and interpreted as severe fixed obstruction with significant improvement after significant bronchodilator response.  Lung volumes consistent with air trapping. WALK:      No data to display           Imaging: No results found.  Lab Results: Personally reviewed and as per per  EMR, eos 300 2020 CBC    Component Value Date/Time   WBC 7.7 01/02/2023 1150   WBC 9.1 07/16/2022 2240   RBC 5.31 01/02/2023 1150   RBC 4.16 (L) 07/16/2022 2240   HGB 15.6 01/02/2023 1150   HCT 48.1 01/02/2023 1150   PLT 288 01/02/2023 1150   MCV 91 01/02/2023 1150   MCH 29.4 01/02/2023 1150   MCH 29.8 07/16/2022 2240   MCHC 32.4 01/02/2023 1150   MCHC 33.2 07/16/2022 2240   RDW 13.2 01/02/2023 1150   LYMPHSABS 0.9 07/16/2022 2240   MONOABS 0.9 07/16/2022 2240   EOSABS 0.1 07/16/2022 2240   BASOSABS 0.0 07/16/2022 2240    BMET    Component Value Date/Time   NA 143 09/12/2022 0821   K 3.9 09/12/2022 0821   CL 101 09/12/2022 0821   CO2 28 09/12/2022 0821   GLUCOSE 127 (H) 09/12/2022 0821   GLUCOSE 126 (H) 08/29/2022 1001   BUN 15 09/12/2022 0821   CREATININE 1.09 09/12/2022 0821   CALCIUM 9.3 09/12/2022  0821   GFRNONAA >60 08/29/2022 1001   GFRAA 99 06/08/2020 1003    BNP    Component Value Date/Time   BNP 192.6 (H) 08/12/2022 1056    ProBNP    Component Value Date/Time   PROBNP 189.0 (H) 10/04/2021 1617    Specialty Problems       Pulmonary Problems   Snoring   SOB (shortness of breath)   Pulmonary emphysema (HCC)   Non-seasonal allergic rhinitis    Allergies  Allergen Reactions   Atorvastatin     Muscle weakness and fatigue on 80mg  daily   Spironolactone     gynecomastia   Metformin And Related Anxiety and Palpitations   Other Anxiety and Palpitations    Immunization History  Administered Date(s) Administered   Influenza,inj,Quad PF,6+ Mos 05/08/2019, 06/08/2020, 04/23/2021   PFIZER(Purple Top)SARS-COV-2 Vaccination 11/04/2019, 11/25/2019   PNEUMOCOCCAL CONJUGATE-20 04/23/2021   Pneumococcal Polysaccharide-23 06/05/2019   Tetanus 05/08/2019    Past Medical History:  Diagnosis Date   Cardiogenic shock (HCC)    CHF (congestive heart failure) (HCC)    Coronary artery disease    Diabetes mellitus without complication (HCC)    Hyperlipidemia    Medical history non-contributory     Tobacco History: Social History   Tobacco Use  Smoking Status Former   Types: Cigarettes   Quit date: 04/08/2019   Years since quitting: 3.8   Passive exposure: Current (wife smokes)  Smokeless Tobacco Never   Counseling given: Not Answered   Continue to not smoke  Outpatient Encounter Medications as of 02/03/2023  Medication Sig   Accu-Chek Softclix Lancets lancets Use to measure blood sugar twice a day   albuterol (PROVENTIL) (2.5 MG/3ML) 0.083% nebulizer solution Take 3 mLs (2.5 mg total) by nebulization every 6 (six) hours as needed for wheezing or shortness of breath.   albuterol (VENTOLIN HFA) 108 (90 Base) MCG/ACT inhaler INHALE 2 PUFFS BY MOUTH EVERY 6 HOURS AS NEEDED FOR WHEEZING FOR SHORTNESS OF BREATH   bisoprolol (ZEBETA) 5 MG tablet Take 1 tablet (5 mg  total) by mouth daily.   Blood Glucose Monitoring Suppl (ACCU-CHEK GUIDE ME) w/Device KIT 1 kit by Does not apply route in the morning and at bedtime. Use to measure blood sugar twice a day   BRILINTA 90 MG TABS tablet Take 1 tablet by mouth twice daily   chlorpheniramine (CHLOR-TRIMETON) 4 MG tablet 1 tab tid prn runny nose   co-enzyme Q-10 30  MG capsule Take 1 capsule (30 mg total) by mouth daily.   empagliflozin (JARDIANCE) 10 MG TABS tablet Take 1 tablet (10 mg total) by mouth daily before breakfast.   eplerenone (INSPRA) 25 MG tablet Take 1 tablet (25 mg total) by mouth daily.   ezetimibe (ZETIA) 10 MG tablet Take 1 tablet (10 mg total) by mouth daily.   fluticasone (FLONASE) 50 MCG/ACT nasal spray USE 1 SPRAY(S) IN EACH NOSTRIL ONCE DAILY AS NEEDED FOR ALLERGIES OR  RHINITIS   furosemide (LASIX) 20 MG tablet Take 2 tablets (40 mg total) by mouth daily.   glucose blood (ACCU-CHEK GUIDE) test strip Use to measure blood sugar twice a day   Hyprom-Naphaz-Polysorb-Zn Sulf (CLEAR EYES COMPLETE) SOLN Apply 1 drop to eye 3 (three) times daily.   loratadine (CLARITIN) 10 MG tablet Take 1 tablet (10 mg total) by mouth daily.   melatonin 1 MG TABS tablet Take 1 tablet (1 mg total) by mouth at bedtime.   montelukast (SINGULAIR) 10 MG tablet TAKE 1 TABLET BY MOUTH AT BEDTIME   nitroGLYCERIN (NITROSTAT) 0.4 MG SL tablet Place 1 tablet (0.4 mg total) under the tongue every 5 (five) minutes x 3 doses as needed for chest pain.   omeprazole (PRILOSEC) 20 MG capsule Take 1 capsule (20 mg total) by mouth daily.   pravastatin (PRAVACHOL) 80 MG tablet Take 1 tablet (80 mg total) by mouth every evening.   sacubitril-valsartan (ENTRESTO) 97-103 MG Take 1 tablet by mouth 2 (two) times daily.   sodium zirconium cyclosilicate (LOKELMA) 5 g packet Take 10 g by mouth daily.   [DISCONTINUED] BREZTRI AEROSPHERE 160-9-4.8 MCG/ACT AERO Inhale 2 puffs by mouth twice daily   benzonatate (TESSALON) 200 MG capsule Take 1  capsule (200 mg total) by mouth 3 (three) times daily as needed. For cough   Budeson-Glycopyrrol-Formoterol (BREZTRI AEROSPHERE) 160-9-4.8 MCG/ACT AERO Inhale 2 puffs into the lungs 2 (two) times daily.   [DISCONTINUED] benzonatate (TESSALON) 100 MG capsule Take 2 capsules (200 mg total) by mouth 3 (three) times daily as needed. For cough (Patient not taking: Reported on 02/03/2023)   No facility-administered encounter medications on file as of 02/03/2023.     Review of Systems  Review of Systems  N/a  Physical Exam  BP 120/72 (BP Location: Left Arm, Patient Position: Sitting, Cuff Size: Normal)   Pulse 67   Temp 98.1 F (36.7 C) (Temporal)   Ht 5\' 8"  (1.727 m)   Wt 192 lb 3.2 oz (87.2 kg)   SpO2 97%   BMI 29.22 kg/m   Wt Readings from Last 5 Encounters:  02/03/23 192 lb 3.2 oz (87.2 kg)  01/02/23 190 lb (86.2 kg)  09/13/22 199 lb 6.4 oz (90.4 kg)  09/12/22 202 lb (91.6 kg)  09/05/22 201 lb (91.2 kg)    BMI Readings from Last 5 Encounters:  02/03/23 29.22 kg/m  01/02/23 28.06 kg/m  09/13/22 29.45 kg/m  09/12/22 29.83 kg/m  09/05/22 29.68 kg/m     Physical Exam General: well appearing, in NAD Eyes: EOMI, no icterus  Neck: Supple, no JVP Cardiovascular: Regular rate and rhythm, normal Pulmonary: Clear to auscultation bilaterally, distant, normal work of breathing MSK: No synovitis, no joint effusion Neuro: Normal gait, no weakness Psych: Normal mood, full affect   Assessment & Plan:   DOE: Likely component of cardiac cause given rising BNP and new left atrial dilation.  Improved with Dulera, worse when outdoors was argues for possible asthma.  At risk for cigarette related lung  disease as well.  PFTs confirm COPD as well as asthma with bronchodilator response.  Overall, improved with triple inhaled therapy via Breztri.  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  PFTs notable for significant bronchodilator response in addition to severe fixed obstruction.  Continue triple inhaled therapy via Breztri.  Refilled today.   Return in about 1 year (around 02/03/2024).   Karren Burly, MD 02/03/2023

## 2023-02-13 ENCOUNTER — Ambulatory Visit: Payer: Medicare HMO | Attending: Cardiology

## 2023-02-13 DIAGNOSIS — Z95810 Presence of automatic (implantable) cardiac defibrillator: Secondary | ICD-10-CM

## 2023-02-13 DIAGNOSIS — I5022 Chronic systolic (congestive) heart failure: Secondary | ICD-10-CM | POA: Diagnosis not present

## 2023-02-15 ENCOUNTER — Telehealth: Payer: Self-pay

## 2023-02-15 NOTE — Telephone Encounter (Signed)
Remote ICM transmission received.  Attempted call to patient regarding ICM remote transmission and left message with ICM number for return phone call.

## 2023-02-15 NOTE — Progress Notes (Signed)
EPIC Encounter for ICM Monitoring  Patient Name: Stephen Jennings is a 67 y.o. male Date: 02/15/2023 Primary Care Physican: Marcine Matar, MD Primary Cardiologist: Kelly/Bensimhon Electrophysiologist: Elberta Fortis 08/24/2022 Weight: 194 lbs (baseline 194-195) 01/24/2023 Weight: 188-190 lbs                                                            Attempted call to patient and unable to reach.  Left message to return call. Transmission reviewed.    CorVue thoracic impedance suggesting normal fluid levels with the exception of suggesting possible fluid accumulation 6/5-6/13.    Prescribed:  Furosemide 20 mg take 2 tablet(s) (40 mg total) by mouth daily.   Eplerenone (inspra) 25 mg take 1 tablet(s) (25 mg total) by mouth daily   Labs: 09/12/2022 Creatinine 1.09, BUN 15, Potassium 3.9, Sodium 143, GFR 75 08/29/2022 Creatinine 1.01, BUN 14, Potassium 3.2, Sodium 141, GFR >60 08/12/2022 Creatinine 1.17, BUN 19, Potassium 3.4, Sodium 140, GFR >60 A complete set of results can be found in Results Review.   Recommendations:  Unable to reach.     Follow-up plan: ICM clinic phone appointment on 03/20/2023.   91 day device clinic remote transmission 04/19/2023.     EP/Cardiology Office Visits:    Recall 12/10/2022 with Dr Gala Romney (4 mo w/echo).  Recall 09/13/2023 with Dr Tresa Endo.   Recall 09/23/2023 with Dr Elberta Fortis.   Copy of ICM check sent to Dr. Elberta Fortis.     3 month ICM trend: 02/13/2023.    12-14 Month ICM trend:     Karie Soda, RN 02/15/2023 1:49 PM

## 2023-03-11 ENCOUNTER — Other Ambulatory Visit (HOSPITAL_COMMUNITY): Payer: Self-pay | Admitting: Internal Medicine

## 2023-03-16 ENCOUNTER — Telehealth: Payer: Self-pay | Admitting: Cardiology

## 2023-03-16 NOTE — Telephone Encounter (Signed)
Pt states he went to the pharmacy and they told him he needed an appointment but he has already been seen by our office on 09/12/22. Please advise.   *STAT* If patient is at the pharmacy, call can be transferred to refill team.   1. Which medications need to be refilled? (please list name of each medication and dose if known)   ticagrelor (BRILINTA) 90 MG TABS tablet    2. Which pharmacy/location (including street and city if local pharmacy) is medication to be sent to? [id number] - [place] ([place]), [place] - [address]    3. Do they need a 30 day or 90 day supply? 90 day

## 2023-03-16 NOTE — Telephone Encounter (Signed)
Pt has an upcoming appt on 04/12/23 with CHF. Please address

## 2023-03-20 ENCOUNTER — Ambulatory Visit: Payer: Medicare HMO

## 2023-03-20 DIAGNOSIS — Z95810 Presence of automatic (implantable) cardiac defibrillator: Secondary | ICD-10-CM

## 2023-03-20 DIAGNOSIS — I5022 Chronic systolic (congestive) heart failure: Secondary | ICD-10-CM | POA: Diagnosis not present

## 2023-03-20 NOTE — Progress Notes (Signed)
EPIC Encounter for ICM Monitoring  Patient Name: Stephen Jennings is a 67 y.o. male Date: 03/20/2023 Primary Care Physican: Marcine Matar, MD Primary Cardiologist: Kelly/Bensimhon Electrophysiologist: [facility] 08/24/2022 Weight: 194 lbs (baseline 194-195) 01/24/2023 Weight: 188-190 lbs 03/20/2023 Weight: 188-190 lbs                                                            Spoke with patient and heart failure questions reviewed.  Transmission results reviewed.  Pt asymptomatic for fluid accumulation.  Reports feeling well at this time and voices no complaints.   He reports stopping Furosemide for about a week to take a break from being in the bathroom so much.   CorVue thoracic impedance suggesting normal fluid levels with the exception of suggesting possible fluid accumulation 7/23-7/28.    Prescribed:  Furosemide 20 mg take 2 tablet(s) (40 mg total) by mouth daily.   Eplerenone (inspra) 25 mg take 1 tablet(s) (25 mg total) by mouth daily   Labs: 09/12/2022 Creatinine 1.09, BUN 15, Potassium 3.9, Sodium 143, GFR 75 08/29/2022 Creatinine 1.01, BUN 14, Potassium 3.2, Sodium 141, GFR >60 08/12/2022 Creatinine 1.17, BUN 19, Potassium 3.4, Sodium 140, GFR >60 A complete set of results can be found in Results Review.   Recommendations:  No changes and encouraged to call if experiencing any fluid symptoms.  Advised to take Lasix as prescribed and avoid missing dosages.    Follow-up plan: ICM clinic phone appointment on 04/20/2023.   91 day device clinic remote transmission 04/19/2023.     EP/Cardiology Office Visits:    04/12/2023 with HF Clinic.  Recall 09/13/2023 with Dr Tresa Endo.   Recall 09/23/2023 with Dr Elberta Fortis.   Copy of ICM check sent to Dr. Elberta Fortis.     3 month ICM trend: 03/20/2023.    12-14 Month ICM trend:     Karie Soda, RN 03/20/2023 12:31 PM

## 2023-04-04 ENCOUNTER — Encounter: Payer: Self-pay | Admitting: Cardiovascular Disease

## 2023-04-06 ENCOUNTER — Encounter: Payer: Self-pay | Admitting: Cardiovascular Disease

## 2023-04-12 ENCOUNTER — Encounter (HOSPITAL_COMMUNITY): Payer: Self-pay

## 2023-04-12 ENCOUNTER — Ambulatory Visit (HOSPITAL_COMMUNITY)
Admission: RE | Admit: 2023-04-12 | Discharge: 2023-04-12 | Disposition: A | Discharge status: Home / Self Care | Payer: Medicare HMO | Source: Ambulatory Visit | Attending: Internal Medicine | Admitting: Internal Medicine

## 2023-04-12 ENCOUNTER — Ambulatory Visit (HOSPITAL_BASED_OUTPATIENT_CLINIC_OR_DEPARTMENT_OTHER)
Admission: RE | Admit: 2023-04-12 | Discharge: 2023-04-12 | Disposition: A | Discharge status: Home / Self Care | Payer: Medicare HMO | Source: Ambulatory Visit | Attending: Adult Health | Admitting: Adult Health

## 2023-04-12 VITALS — BP 132/70 | HR 82 | Wt 191.6 lb

## 2023-04-12 DIAGNOSIS — I252 Old myocardial infarction: Secondary | ICD-10-CM | POA: Insufficient documentation

## 2023-04-12 DIAGNOSIS — I052 Rheumatic mitral stenosis with insufficiency: Secondary | ICD-10-CM | POA: Insufficient documentation

## 2023-04-12 DIAGNOSIS — Z95810 Presence of automatic (implantable) cardiac defibrillator: Secondary | ICD-10-CM | POA: Diagnosis not present

## 2023-04-12 DIAGNOSIS — I110 Hypertensive heart disease with heart failure: Secondary | ICD-10-CM | POA: Diagnosis not present

## 2023-04-12 DIAGNOSIS — Z955 Presence of coronary angioplasty implant and graft: Secondary | ICD-10-CM | POA: Insufficient documentation

## 2023-04-12 DIAGNOSIS — I5022 Chronic systolic (congestive) heart failure: Secondary | ICD-10-CM | POA: Insufficient documentation

## 2023-04-12 DIAGNOSIS — J449 Chronic obstructive pulmonary disease, unspecified: Secondary | ICD-10-CM | POA: Insufficient documentation

## 2023-04-12 DIAGNOSIS — Z87891 Personal history of nicotine dependence: Secondary | ICD-10-CM | POA: Diagnosis not present

## 2023-04-12 DIAGNOSIS — I10 Essential (primary) hypertension: Secondary | ICD-10-CM

## 2023-04-12 DIAGNOSIS — I2510 Atherosclerotic heart disease of native coronary artery without angina pectoris: Secondary | ICD-10-CM | POA: Insufficient documentation

## 2023-04-12 DIAGNOSIS — E785 Hyperlipidemia, unspecified: Secondary | ICD-10-CM | POA: Insufficient documentation

## 2023-04-12 DIAGNOSIS — E119 Type 2 diabetes mellitus without complications: Secondary | ICD-10-CM | POA: Diagnosis not present

## 2023-04-12 DIAGNOSIS — Z7984 Long term (current) use of oral hypoglycemic drugs: Secondary | ICD-10-CM | POA: Insufficient documentation

## 2023-04-12 DIAGNOSIS — I255 Ischemic cardiomyopathy: Secondary | ICD-10-CM

## 2023-04-12 DIAGNOSIS — I4721 Torsades de pointes: Secondary | ICD-10-CM | POA: Diagnosis not present

## 2023-04-12 DIAGNOSIS — Z8674 Personal history of sudden cardiac arrest: Secondary | ICD-10-CM | POA: Insufficient documentation

## 2023-04-12 DIAGNOSIS — Z79899 Other long term (current) drug therapy: Secondary | ICD-10-CM | POA: Diagnosis not present

## 2023-04-12 LAB — ECHOCARDIOGRAM COMPLETE
AR max vel: 2.11 cm2
AV Area VTI: 1.84 cm2
AV Area mean vel: 2.08 cm2
AV Mean grad: 4.5 mmHg
AV Peak grad: 8.4 mmHg
Ao pk vel: 1.45 m/s
Area-P 1/2: 4.36 cm2
Calc EF: 43.7 %
MV M vel: 5.28 m/s
MV Peak grad: 111.3 mmHg
MV Vena cont: 0.1 cm
Radius: 0.3 cm
S' Lateral: 4.8 cm
Single Plane A2C EF: 45 %
Single Plane A4C EF: 39.6 %

## 2023-04-12 LAB — BASIC METABOLIC PANEL
Anion gap: 14 (ref 5–15)
BUN: 11 mg/dL (ref 8–23)
CO2: 25 mmol/L (ref 22–32)
Calcium: 9.4 mg/dL (ref 8.9–10.3)
Chloride: 104 mmol/L (ref 98–111)
Creatinine, Ser: 0.89 mg/dL (ref 0.61–1.24)
GFR, Estimated: >60 mL/min (ref >60)
Glucose, Bld: 125 mg/dL — ABNORMAL HIGH (ref 70–99)
Potassium: 3.1 mmol/L — ABNORMAL LOW (ref 3.5–5.1)
Sodium: 143 mmol/L (ref 135–145)

## 2023-04-12 NOTE — Progress Notes (Signed)
Advanced [facility] Failure Clinic Note   PCP: Marcine Matar, MD Primary Cardiologist: Nicki Guadalajara, MD  HF Cardiologist: Dr. Gala Romney  HPI: Stephen Jennings is a 67 y.o. 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    Brought to ER 03/31/19 with respiratory failure/cardiac arrest. Intubated in ER. Initial ECG with anterior ST elevation. Cath by Dr. Tresa Endo with 3v CAD and totally occluded prox LAD. LCX anomalous off RCA. Underwent PCI of LAD which was diffusely diseased vessel. Developed cardiogenic shock requiring pressors and mechanical support with impella.  03/31/2019 Echo 20-25%. Hospital course complicated by Torsades. EP consulted placed ICD prior to discharge.    Echo 2/21  EF 45-50%   Echo 4/22 EF 55-60%    He stopped Brillinta in 4/22. Admitted in 6/22 with NSTEMI. Cath with patent proximal LAD stent with mild 20% in-stent narrowing.  There is significant thrombus burden immediately after the stent extending into the diagonal vessel and mid LAD with TIMI-3 flow. Treated with Aggrastat x 48 hours. ECHO 02/02/21 EF 35-40% Relook angiography of the left coronary system showed improvement in the previous thrombus burden in the diagonal and LAD distal stent and beyond but moderate residual thrombus was still present. Underwent successful percutaneous coronary intervention of the LAD utilizing Pronto thrombectomy, intravascular ultrasound and ultimate PTCA of the distal stented segment and mid LAD with residual narrowing 0%. Recommended indefinite DAPT.   Echo 07/27/21 EF 35-40%.   Seen in ED 07/14/22 for acute CHF. Advised he increase Lasix x 5 days. Discharged home.  Today he returns for HF follow up.Overall feeling ok but feels like he is getting fluid. Having a hard time because his wife is in the hospital with Stage IV cancer.  SOB with brisk walking. Worse recently. Denies PND/Orthopnea. No chest pain.  Appetite ok. Eating ham biscuits for breakfast.  No fever or chills. Weight at home  187-191 pounds. Taking all medications but he cut back lasix to 20 mg daily. Lives with his wife.   COrVue: Impedance trending down. Activity 8 hours per day.   Cardiac Studies - Echo 12/22: EF 35-40%  Cath: 02/02/21 Prox RCA lesion is 20% stenosed. Prox LAD to Mid LAD lesion is 15% stenosed. 1st Diag lesion is 5% stenosed. Ost LAD to Prox LAD lesion is 20% stenosed. Dist LAD-1 lesion is 40% stenosed. Dist LAD-2 lesion is 80% stenosed. There is severe left ventricular systolic dysfunction. LV end diastolic pressure is severely elevated. The left ventricular ejection fraction is 25-35% by visual estimate.  Past Medical History:  Diagnosis Date   Cardiogenic shock (HCC)    CHF (congestive heart failure) (HCC)    Coronary artery disease    Diabetes mellitus without complication (HCC)    Hyperlipidemia    Medical history non-contributory    Current Outpatient Medications  Medication Sig Dispense Refill   Accu-Chek Softclix Lancets lancets Use to measure blood sugar twice a day 100 each 6   albuterol (PROVENTIL) (2.5 MG/3ML) 0.083% nebulizer solution Take 3 mLs (2.5 mg total) by nebulization every 6 (six) hours as needed for wheezing or shortness of breath. 75 mL 12   albuterol (VENTOLIN HFA) 108 (90 Base) MCG/ACT inhaler INHALE 2 PUFFS BY MOUTH EVERY 6 HOURS AS NEEDED FOR WHEEZING FOR SHORTNESS OF BREATH 18 g 0   benzonatate (TESSALON) 200 MG capsule Take 1 capsule (200 mg total) by mouth 3 (three) times daily as needed. For cough 30 capsule 6   bisoprolol (ZEBETA) 5 MG tablet  Take 1 tablet (5 mg total) by mouth daily. 30 tablet 11   Blood Glucose Monitoring Suppl (ACCU-CHEK GUIDE ME) w/Device KIT 1 kit by Does not apply route in the morning and at bedtime. Use to measure blood sugar twice a day 1 kit 0   Budeson-Glycopyrrol-Formoterol (BREZTRI AEROSPHERE) 160-9-4.8 MCG/ACT AERO Inhale 2 puffs into the lungs 2 (two) times daily. 1 each 11   chlorpheniramine (CHLOR-TRIMETON) 4 MG tablet  1 tab tid prn runny nose 40 tablet 0   co-enzyme Q-10 30 MG capsule Take 1 capsule (30 mg total) by mouth daily. 90 capsule 3   empagliflozin (JARDIANCE) 10 MG TABS tablet Take 1 tablet (10 mg total) by mouth daily before breakfast. 90 tablet 3   eplerenone (INSPRA) 25 MG tablet Take 1 tablet (25 mg total) by mouth daily. 90 tablet 3   ezetimibe (ZETIA) 10 MG tablet Take 1 tablet (10 mg total) by mouth daily. 90 tablet 3   fluticasone (FLONASE) 50 MCG/ACT nasal spray USE 1 SPRAY(S) IN EACH NOSTRIL ONCE DAILY AS NEEDED FOR ALLERGIES OR  RHINITIS 48 g 0   furosemide (LASIX) 20 MG tablet Take 2 tablets (40 mg total) by mouth daily. (Patient taking differently: Take 20 mg by mouth daily.) 180 tablet 3   glucose blood (ACCU-CHEK GUIDE) test strip Use to measure blood sugar twice a day 100 each 6   Hyprom-Naphaz-Polysorb-Zn Sulf (CLEAR EYES COMPLETE) SOLN Apply 1 drop to eye 3 (three) times daily. 15 mL 1   loratadine (CLARITIN) 10 MG tablet Take 1 tablet (10 mg total) by mouth daily. 30 tablet 11   melatonin 1 MG TABS tablet Take 1 tablet (1 mg total) by mouth at bedtime. 30 tablet 1   montelukast (SINGULAIR) 10 MG tablet TAKE 1 TABLET BY MOUTH AT BEDTIME 30 tablet 2   nitroGLYCERIN (NITROSTAT) 0.4 MG SL tablet Place 1 tablet (0.4 mg total) under the tongue every 5 (five) minutes x 3 doses as needed for chest pain. 25 tablet 3   omeprazole (PRILOSEC) 20 MG capsule Take 1 capsule (20 mg total) by mouth daily. 90 capsule 1   pravastatin (PRAVACHOL) 80 MG tablet Take 1 tablet (80 mg total) by mouth every evening. 90 tablet 3   sacubitril-valsartan (ENTRESTO) 97-103 MG Take 1 tablet by mouth 2 (two) times daily. 180 tablet 3   sodium zirconium cyclosilicate (LOKELMA) 5 g packet Take 10 g by mouth daily. 30 each 11   ticagrelor (BRILINTA) 90 MG TABS tablet Take 1 tablet by mouth twice daily 60 tablet 0   No current facility-administered medications for this encounter.   Allergies  Allergen Reactions    Atorvastatin     Muscle weakness and fatigue on 80mg  daily   Spironolactone     gynecomastia   Metformin And Related Anxiety and Palpitations   Other Anxiety and Palpitations   Social History   Socioeconomic History   Marital status: Married    Spouse name: Not on file   Number of children: 1   Years of education: 8   Highest education level: 8th grade  Occupational History   Not on file  Tobacco Use   Smoking status: Former    Current packs/day: 0.00    Types: Cigarettes    Quit date: 04/08/2019    Years since quitting: 4.0    Passive exposure: Current (wife smokes)   Smokeless tobacco: Never  Vaping Use   Vaping status: Never Used  Substance and Sexual Activity   Alcohol  use: No   Drug use: No   Sexual activity: Not Currently  Other Topics Concern   Not on file  Social History Narrative   Lives with wife   Right handed   Caffeine: about 2 cups of coffee every morning, no soda   Social Determinants of Health   Financial Resource Strain: Low Risk  (03/14/2022)   Overall Financial Resource Strain (CARDIA)    Difficulty of Paying Living Expenses: Not very hard  Food Insecurity: No Food Insecurity (03/14/2022)   Hunger Vital Sign    Worried About Running Out of Food in the Last Year: Never true    Ran Out of Food in the Last Year: Never true  Transportation Needs: No Transportation Needs (07/20/2022)   PRAPARE - Administrator, Civil Service (Medical): No    Lack of Transportation (Non-Medical): No  Physical Activity: Sufficiently Active (03/14/2022)   Exercise Vital Sign    Days of Exercise per Week: 7 days    Minutes of Exercise per Session: 60 min  Stress: No Stress Concern Present (03/14/2022)   [employer] of Occupational Health - Occupational Stress Questionnaire    Feeling of Stress : Not at all  Social Connections: Moderately Isolated (03/14/2022)   Social Connection and Isolation Panel [NHANES]    Frequency of Communication with Friends and  Family: More than three times a week    Frequency of Social Gatherings with Friends and Family: More than three times a week    Attends Religious Services: Never    Database administrator or Organizations: No    Attends Banker Meetings: Never    Marital Status: Married  Catering manager Violence: Not At Risk (03/14/2022)   Humiliation, Afraid, Rape, and Kick questionnaire    Fear of Current or Ex-Partner: No    Emotionally Abused: No    Physically Abused: No    Sexually Abused: No   Family History  Problem Relation Age of Onset   Hypertension Mother    Heart failure Mother    Headache Neg Hx    Migraines Neg Hx    BP 132/70   Pulse 82   Wt 86.9 kg (191 lb 9.6 oz)   SpO2 96%   BMI 29.13 kg/m   Wt Readings from Last 3 Encounters:  04/12/23 86.9 kg (191 lb 9.6 oz)  02/03/23 87.2 kg (192 lb 3.2 oz)  01/02/23 86.2 kg (190 lb)   PHYSICAL EXAM: General:  Well appearing. No resp difficulty HEENT: normal Neck: supple. JVP 9-10. Carotids 2+ bilat; no bruits. No lymphadenopathy or thryomegaly appreciated. Cor: PMI nondisplaced. Regular rate & rhythm. No rubs, gallops or murmurs. Lungs: clear Abdomen: soft, nontender, nondistended. No hepatosplenomegaly. No bruits or masses. Good bowel sounds. Extremities: no cyanosis, clubbing, rash, edema Neuro: alert & orientedx3, cranial nerves grossly intact. moves all 4 extremities w/o difficulty. Affect pleasant  ASSESSMENT & PLAN: 1. Chronic systolic HF - Echo 8/20 EF20-25% - Echo 09/23/19: EF 45-50% - Echo 4/22: EF 55-60%  - NSTEMI in 6/22 -> Echo 6/22 EF 35-40% - Echo  07/27/21 EF 35-40% . Echo today EF 35-40% RV normal . Grade IIDD . I discussed ECHO results.  - NYHA II-III, CorVue. Impedance trending down.  - Volume status trending up. Increase lasix to 40 mg daily x 3 days then back to 20 mg daily.  - Continue Entresto to 97/103 mg bid. - Continue bisoprolol 5 mg daily. - Continue Jardiance 10 mg daily. - Continue  eplerenone 12.5 mg daily. Check BMET   2. 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 -No VT on device interrogation.   3. CAD  - s/p acute anterior MI 8/20 with PCI/DES to LAD. 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 - NSTEMI in 6/22 after stopping Brillinta. Stent patent but clot after stent -> s/p thrombectomy and POBA - Continue DAPT and statin indefinitely. - No chest pain.  - Continue Jardiance 10 mg daily. - Last LDL    4. DM2 - On metformin & Jardiance.  - Per PCP.   5. COPD - Stable. Continue inhalers - No longer smoking  6. HTN - Stable.   Discussed CorVue and ECHO results.  Will reach out to the Device Clinic regarding increased fluid.   Follow up in 3 months with Dr Gala Romney.   Stephen Ruacho NP-C  10:42 AM    Stephen Becket, NP 04/12/23

## 2023-04-12 NOTE — Patient Instructions (Signed)
Thank you for coming in today  If you had labs drawn today, any labs that are abnormal the clinic will call you No news is good news  Medications: Take Lasix 40 mg for 3 days 04/12/2023 04/13/2023 and 04/14/2023 Then on 04/15/2023 take 20 mg daily  Follow up appointments:  Your physician recommends that you schedule a follow-up appointment in:  3 months With Dr. Gala Romney    Do the following things EVERYDAY: Weigh yourself in the morning before breakfast. Write it down and keep it in a log. Take your medicines as prescribed Eat low salt foods--Limit salt (sodium) to 2000 mg per day.  Stay as active as you can everyday Limit all fluids for the day to less than 2 liters   At the [facility], you and your health needs are our priority. As part of our continuing mission to provide you with exceptional heart care, we have created designated Provider Care Teams. These Care Teams include your primary Cardiologist (physician) and Advanced Practice Providers (APPs- Physician Assistants and Nurse Practitioners) who all work together to provide you with the care you need, when you need it.   You may see any of the following providers on your designated Care Team at your next follow up: Dr Arvilla Meres Dr Marca Ancona Dr. Marcos Eke, NP Robbie Lis, [place] [facility] [place], [place] Brynda Peon, NP Karle Plumber, PharmD   Please be sure to bring in all your medications bottles to every appointment.    Thank you for choosing [facility]  If you have any questions or concerns before your next appointment please send [place] a message through [place] or call our office at [phone].    TO LEAVE A MESSAGE FOR THE NURSE SELECT OPTION 2, PLEASE LEAVE A MESSAGE INCLUDING: YOUR NAME DATE OF BIRTH CALL BACK NUMBER REASON FOR CALL**this is important as we prioritize the call backs  YOU WILL RECEIVE A CALL  BACK THE SAME DAY AS LONG AS YOU CALL BEFORE 4:00 PM

## 2023-04-14 ENCOUNTER — Telehealth (HOSPITAL_COMMUNITY): Payer: Self-pay

## 2023-04-14 DIAGNOSIS — I5022 Chronic systolic (congestive) heart failure: Secondary | ICD-10-CM

## 2023-04-14 NOTE — Telephone Encounter (Signed)
-----   Message from Tonye Becket sent at 04/14/2023  7:52 AM EDT ----- Please call with below results.

## 2023-04-14 NOTE — Telephone Encounter (Signed)
Patient will stop his lokelma medication. In addition, his labs has been ordered and his appointment scheduled. Pt aware, agreeable, and verbalized understanding.

## 2023-04-16 ENCOUNTER — Other Ambulatory Visit: Payer: Self-pay | Admitting: Pulmonary Disease

## 2023-04-16 ENCOUNTER — Other Ambulatory Visit (HOSPITAL_COMMUNITY): Payer: Self-pay | Admitting: Internal Medicine

## 2023-04-16 DIAGNOSIS — J439 Emphysema, unspecified: Secondary | ICD-10-CM

## 2023-04-17 ENCOUNTER — Other Ambulatory Visit (HOSPITAL_COMMUNITY): Payer: Self-pay

## 2023-04-17 MED ORDER — TICAGRELOR 90 MG PO TABS: 90.0000 mg | ORAL_TABLET | Freq: Two times a day (BID) | ORAL | 11 refills | Status: DC

## 2023-04-17 NOTE — Telephone Encounter (Signed)
Meds ordered this encounter  Medications   ticagrelor (BRILINTA) 90 MG TABS tablet    Sig: Take 1 tablet (90 mg total) by mouth 2 (two) times daily.    Dispense:  60 tablet    Refill:  11    Patient needs to schedule an office visit for further refills.

## 2023-04-19 ENCOUNTER — Ambulatory Visit (INDEPENDENT_AMBULATORY_CARE_PROVIDER_SITE_OTHER): Payer: Medicare HMO

## 2023-04-19 ENCOUNTER — Ambulatory Visit (HOSPITAL_COMMUNITY)
Admission: RE | Admit: 2023-04-19 | Discharge: 2023-04-19 | Disposition: A | Discharge status: Home / Self Care | Payer: Medicare HMO | Source: Ambulatory Visit | Attending: Adult Health | Admitting: Adult Health

## 2023-04-19 ENCOUNTER — Other Ambulatory Visit: Payer: Self-pay | Admitting: Pharmacist

## 2023-04-19 ENCOUNTER — Telehealth (HOSPITAL_COMMUNITY): Payer: Self-pay

## 2023-04-19 DIAGNOSIS — I255 Ischemic cardiomyopathy: Secondary | ICD-10-CM | POA: Diagnosis not present

## 2023-04-19 DIAGNOSIS — I5022 Chronic systolic (congestive) heart failure: Secondary | ICD-10-CM | POA: Insufficient documentation

## 2023-04-19 LAB — CUP PACEART REMOTE DEVICE CHECK
Battery Remaining Longevity: 62 mo
Battery Remaining Percentage: 63 %
Battery Voltage: 2.98 V
Brady Statistic AP VP Percent: 1 %
Brady Statistic AP VS Percent: 12 %
Brady Statistic AS VP Percent: 1 %
Brady Statistic AS VS Percent: 88 %
Brady Statistic RA Percent Paced: 11 %
Brady Statistic RV Percent Paced: 1 %
Date Time Interrogation Session: 20240828040038
HighPow Impedance: 66 Ohm
HighPow Impedance: 66 Ohm
Implantable Lead Connection Status: 753985
Implantable Lead Connection Status: 753985
Implantable Lead Implant Date: 20200824
Implantable Lead Implant Date: 20200824
Implantable Lead Location: 753859
Implantable Lead Location: 753860
Implantable Pulse Generator Implant Date: 20200824
Lead Channel Impedance Value: 330 Ohm
Lead Channel Impedance Value: 350 Ohm
Lead Channel Pacing Threshold Amplitude: 0.5 V
Lead Channel Pacing Threshold Amplitude: 1.25 V
Lead Channel Pacing Threshold Pulse Width: 0.5 ms
Lead Channel Pacing Threshold Pulse Width: 0.5 ms
Lead Channel Sensing Intrinsic Amplitude: 1.1 mV
Lead Channel Sensing Intrinsic Amplitude: 11.9 mV
Lead Channel Setting Pacing Amplitude: 2 V
Lead Channel Setting Pacing Amplitude: 2.5 V
Lead Channel Setting Pacing Pulse Width: 0.5 ms
Lead Channel Setting Sensing Sensitivity: 0.5 mV
Pulse Gen Serial Number: 1300954

## 2023-04-19 LAB — BASIC METABOLIC PANEL
Anion gap: 11 (ref 5–15)
BUN: 18 mg/dL (ref 8–23)
CO2: 29 mmol/L (ref 22–32)
Calcium: 8.8 mg/dL — ABNORMAL LOW (ref 8.9–10.3)
Chloride: 99 mmol/L (ref 98–111)
Creatinine, Ser: 1.16 mg/dL (ref 0.61–1.24)
GFR, Estimated: >60 mL/min (ref >60)
Glucose, Bld: 138 mg/dL — ABNORMAL HIGH (ref 70–99)
Potassium: 2.5 mmol/L — CL (ref 3.5–5.1)
Sodium: 139 mmol/L (ref 135–145)

## 2023-04-19 MED ORDER — POTASSIUM CHLORIDE CRYS ER 20 MEQ PO TBCR: EXTENDED_RELEASE_TABLET | ORAL | 3 refills | Status: DC

## 2023-04-19 NOTE — Telephone Encounter (Signed)
-----   Message from Tonye Becket sent at 04/19/2023  1:44 PM EDT ----- K critically low. Make sure he has stopped using Lokelma. Please call.   Today he needs to take 60 meq KDUR now then another 60 meq KDUR in 4 hours.  Starting tomorrow he needs to take 40 meq KDUR twice a day.  Repeat BMET Friday.

## 2023-04-19 NOTE — Telephone Encounter (Signed)
Patient advised and verbalized understanding,lab appointment scheduled,lab orders entered. Med list updated to reflect changes.  Meds ordered this encounter  Medications   potassium chloride SA (KLOR-CON M20) 20 MEQ tablet    Sig: On the first day take (3 tablets) wait 4 hours then take another (3 tablets) by mouth. THEN THURSDAY 8/29 start 2 tablets by mouth 2 times daily.    Dispense:  90 tablet    Refill:  3   Orders Placed This Encounter  Procedures   Basic metabolic panel    Standing Status:   Future    Standing Expiration Date:   04/18/2024    Order Specific Question:   Release to patient    Answer:   Immediate    Order Specific Question:   Release to patient    Answer:   Immediate [1]

## 2023-04-19 NOTE — Progress Notes (Signed)
Patient ID: Stephen Jennings, male   DOB: 10/29/1955, 67 y.o.   MRN: [id number]  Pharmacy Quality Measure Review  This patient is appearing on a report for being at risk of failing the adherence measure for cholesterol (statin) medications this calendar year.   Medication: pravastatin Last fill date: 03/13/2023 for 90 day supply. Refills left on rxn.  Insurance report was not up to date. No action needed at this time.   Butch Penny, PharmD, Patsy [place], CPP Clinical Pharmacist [facility] [phone]

## 2023-04-20 ENCOUNTER — Other Ambulatory Visit (HOSPITAL_COMMUNITY): Payer: Medicare HMO

## 2023-04-20 ENCOUNTER — Telehealth: Payer: Self-pay

## 2023-04-20 ENCOUNTER — Ambulatory Visit: Payer: Medicare HMO | Attending: Cardiology

## 2023-04-20 DIAGNOSIS — I5022 Chronic systolic (congestive) heart failure: Secondary | ICD-10-CM | POA: Diagnosis not present

## 2023-04-20 DIAGNOSIS — Z95810 Presence of automatic (implantable) cardiac defibrillator: Secondary | ICD-10-CM | POA: Diagnosis not present

## 2023-04-20 NOTE — Progress Notes (Signed)
Sherald Hess, NP  April Carlyon, Josephine Igo, RN Yes staff reached out to him.  Thanks Amy

## 2023-04-20 NOTE — Progress Notes (Signed)
EPIC Encounter for ICM Monitoring  Patient Name: Stephen Jennings is a 67 y.o. male Date: 04/20/2023 Primary Care Physican: Marcine Matar, MD Primary Cardiologist: Kelly/Bensimhon Electrophysiologist: [facility] 08/24/2022 Weight: 194 lbs (baseline 194-195) 01/24/2023 Weight: 188-190 lbs 03/20/2023 Weight: 188-190 lbs                                                            Attempted call to patient and unable to reach.  Transmission reviewed.    CorVue thoracic impedance suggesting possible dryness starting 8/22.  Impedance was suggesting possible fluid accumulation from 8/7-8/21 (addressed at 8/21 HF OV).    Prescribed:  Furosemide 20 mg take 2 tablet(s) (40 mg total) by mouth daily.  Per HF clinic note on 8/21 was instructed to Increase lasix to 40 mg daily x 3 days then back to 20 mg daily (not updated in EPIC).  Potassium 20 mEq take 2 tablets (40 mEq total) by mouth twice a day Eplerenone (inspra) 25 mg take 1 tablet(s) (25 mg total) by mouth daily   Labs: 04/20/2023 BMET Scheduled at HF clinic 04/19/2023 Creatinine 1.16, BUN 18, Potassium 2.5, Sodium 139, GFR >60 (low K+ addressed by HF clinic) 04/12/2023 Creatinine 0.89, BUN 11, Potassium 3.1, Sodium 143, GFR >60  09/12/2022 Creatinine 1.09, BUN 15, Potassium 3.9, Sodium 143, GFR 75 08/29/2022 Creatinine 1.01, BUN 14, Potassium 3.2, Sodium 141, GFR >60 08/12/2022 Creatinine 1.17, BUN 19, Potassium 3.4, Sodium 140, GFR >60 A complete set of results can be found in Results Review.   Recommendations:  Unable to reach.     Follow-up plan: ICM clinic phone appointment on 04/26/2023 to recheck fluid levels.   91 day device clinic remote transmission 07/19/2023.     EP/Cardiology Office Visits:    Need 3 month appt with Dr Gala Romney following 04/12/2023 HF Clinic visit.  Recall 09/13/2023 with Dr Tresa Endo.   Recall 09/23/2023 with Dr Elberta Fortis.   Copy of ICM check sent to Dr. Elberta Fortis and Tonye Becket, NP for review.    3 month ICM trend:  04/19/2023.    12-14 Month ICM trend:     Karie Soda, RN 04/20/2023 7:41 AM

## 2023-04-20 NOTE — Telephone Encounter (Signed)
Remote ICM transmission received.  Attempted call to patient regarding ICM remote transmission and no answer.  

## 2023-04-21 ENCOUNTER — Ambulatory Visit (HOSPITAL_COMMUNITY)
Admission: RE | Admit: 2023-04-21 | Discharge: 2023-04-21 | Disposition: A | Discharge status: Home / Self Care | Payer: Medicare HMO | Source: Ambulatory Visit | Attending: Cardiology | Admitting: Cardiology

## 2023-04-21 DIAGNOSIS — I5022 Chronic systolic (congestive) heart failure: Secondary | ICD-10-CM | POA: Diagnosis not present

## 2023-04-21 LAB — BASIC METABOLIC PANEL
Anion gap: 16 — ABNORMAL HIGH (ref 5–15)
BUN: 16 mg/dL (ref 8–23)
CO2: 27 mmol/L (ref 22–32)
Calcium: 9.2 mg/dL (ref 8.9–10.3)
Chloride: 100 mmol/L (ref 98–111)
Creatinine, Ser: 0.93 mg/dL (ref 0.61–1.24)
GFR, Estimated: >60 mL/min (ref >60)
Glucose, Bld: 134 mg/dL — ABNORMAL HIGH (ref 70–99)
Potassium: 4 mmol/L (ref 3.5–5.1)
Sodium: 143 mmol/L (ref 135–145)

## 2023-04-26 ENCOUNTER — Ambulatory Visit: Payer: Medicare HMO | Attending: Cardiology

## 2023-04-26 DIAGNOSIS — Z95810 Presence of automatic (implantable) cardiac defibrillator: Secondary | ICD-10-CM

## 2023-04-26 DIAGNOSIS — I5022 Chronic systolic (congestive) heart failure: Secondary | ICD-10-CM

## 2023-04-27 NOTE — Progress Notes (Signed)
Remote ICD transmission.   

## 2023-04-27 NOTE — Progress Notes (Signed)
EPIC Encounter for ICM Monitoring  Patient Name: Stephen Jennings is a 67 y.o. male Date: 04/27/2023 Primary Care Physican: Marcine Matar, MD Primary Cardiologist: Kelly/Bensimhon Electrophysiologist: [facility] 08/24/2022 Weight: 194 lbs (baseline 194-195) 01/24/2023 Weight: 188-190 lbs 03/20/2023 Weight: 188-190 lbs                                                            Transmission reviewed.    CorVue thoracic impedance suggesting fluid levels returned to normal.    Prescribed:  Furosemide 20 mg take 2 tablet(s) (40 mg total) by mouth daily.  Per HF clinic note on 8/21 was instructed to Increase lasix to 40 mg daily x 3 days then back to 20 mg daily (not updated in EPIC).  Potassium 20 mEq take 2 tablets (40 mEq total) by mouth twice a day Eplerenone (inspra) 25 mg take 1 tablet(s) (25 mg total) by mouth daily   Labs: 04/21/2023 Creatinine 0.93, BUN 16, Potassium 4.0, Sodium 143, GFR >60 04/19/2023 Creatinine 1.16, BUN 18, Potassium 2.5, Sodium 139, GFR >60 (low K+ addressed by HF clinic) 04/12/2023 Creatinine 0.89, BUN 11, Potassium 3.1, Sodium 143, GFR >60  09/12/2022 Creatinine 1.09, BUN 15, Potassium 3.9, Sodium 143, GFR 75 08/29/2022 Creatinine 1.01, BUN 14, Potassium 3.2, Sodium 141, GFR >60 08/12/2022 Creatinine 1.17, BUN 19, Potassium 3.4, Sodium 140, GFR >60 A complete set of results can be found in Results Review.   Recommendations:  None   Follow-up plan: ICM clinic phone appointment on 05/29/2023.   91 day device clinic remote transmission 07/19/2023.     EP/Cardiology Office Visits:    Need 3 month appt with Dr Gala Romney following 04/12/2023 HF Clinic visit.  Recall 09/13/2023 with Dr Tresa Endo.   Recall 09/23/2023 with Dr Elberta Fortis.   Copy of ICM check sent to Dr. Elberta Fortis  3 month ICM trend: 04/26/2023.    12-14 Month ICM trend:     Karie Soda, RN 04/27/2023 3:11 PM

## 2023-05-01 ENCOUNTER — Telehealth: Payer: Self-pay | Admitting: Cardiovascular Disease

## 2023-05-01 MED ORDER — ENTRESTO 97-103 MG PO TABS: 1.0000 | ORAL_TABLET | Freq: Two times a day (BID) | ORAL | 2 refills | Status: DC

## 2023-05-01 NOTE — Telephone Encounter (Signed)
*  STAT* If patient is at the pharmacy, call can be transferred to refill team.   1. Which medications need to be refilled? (please list name of each medication and dose if known) sacubitril-valsartan (ENTRESTO) 97-103 MG   2. Which pharmacy/location (including street and city if local pharmacy) is medication to be sent to?  [id number] - [place], [place] - [address]    3. Do they need a 30 day or 90 day supply? 90

## 2023-05-01 NOTE — Telephone Encounter (Signed)
Per last OV note with Tonye Becket, NP on 8/21  - Continue Entresto to 97/103 mg bid.   Medication refilled.

## 2023-05-02 ENCOUNTER — Ambulatory Visit: Payer: Medicare HMO | Attending: Internal Medicine

## 2023-05-03 ENCOUNTER — Encounter: Payer: Self-pay | Admitting: Cardiovascular Disease

## 2023-05-05 ENCOUNTER — Encounter: Payer: Self-pay | Admitting: Internal Medicine

## 2023-05-05 ENCOUNTER — Ambulatory Visit: Payer: Medicare HMO | Attending: Internal Medicine | Admitting: Internal Medicine

## 2023-05-05 VITALS — BP 138/76 | HR 66 | Ht 68.0 in | Wt 184.0 lb

## 2023-05-05 DIAGNOSIS — E119 Type 2 diabetes mellitus without complications: Secondary | ICD-10-CM | POA: Diagnosis not present

## 2023-05-05 DIAGNOSIS — I5022 Chronic systolic (congestive) heart failure: Secondary | ICD-10-CM

## 2023-05-05 DIAGNOSIS — Z7984 Long term (current) use of oral hypoglycemic drugs: Secondary | ICD-10-CM

## 2023-05-05 DIAGNOSIS — E782 Mixed hyperlipidemia: Secondary | ICD-10-CM | POA: Diagnosis not present

## 2023-05-05 DIAGNOSIS — G4700 Insomnia, unspecified: Secondary | ICD-10-CM

## 2023-05-05 DIAGNOSIS — Z6379 Other stressful life events affecting family and household: Secondary | ICD-10-CM

## 2023-05-05 DIAGNOSIS — I152 Hypertension secondary to endocrine disorders: Secondary | ICD-10-CM

## 2023-05-05 DIAGNOSIS — I2510 Atherosclerotic heart disease of native coronary artery without angina pectoris: Secondary | ICD-10-CM

## 2023-05-05 DIAGNOSIS — Z2821 Immunization not carried out because of patient refusal: Secondary | ICD-10-CM

## 2023-05-05 DIAGNOSIS — J439 Emphysema, unspecified: Secondary | ICD-10-CM | POA: Diagnosis not present

## 2023-05-05 DIAGNOSIS — E1159 Type 2 diabetes mellitus with other circulatory complications: Secondary | ICD-10-CM

## 2023-05-05 LAB — POCT GLYCOSYLATED HEMOGLOBIN (HGB A1C): HbA1c, POC (controlled diabetic range): 6 % (ref 0.0–7.0)

## 2023-05-05 LAB — GLUCOSE, POCT (MANUAL RESULT ENTRY): POC Glucose: 137 mg/dL — AB (ref 70–99)

## 2023-05-05 MED ORDER — TRAZODONE HCL 50 MG PO TABS
ORAL_TABLET | ORAL | 1 refills | Status: DC
Start: 2023-05-05 — End: 2023-10-20

## 2023-05-05 MED ORDER — MONTELUKAST SODIUM 10 MG PO TABS
10.0000 mg | ORAL_TABLET | Freq: Every day | ORAL | 6 refills | Status: DC
Start: 2023-05-05 — End: 2024-05-15

## 2023-05-05 NOTE — Progress Notes (Signed)
Patient ID: Stephen Jennings, male    DOB: 1955/08/29  MRN: [id number]  CC: Diabetes (DM f/u. Med refill - requesting to start trazodone again/No questions / concerns/No to flu vax. No to shingles vax. No to colonoscopy referal)   Subjective: Stephen Jennings is a 67 y.o. male who presents for chronic ds management. His concerns today include:  patient with history of CAD(NSTEMI with stent to LAD 03/2019), ICM with last EF 35-40% (mild to moderate MR and MS 8/24), ICD present,  former smoker, COPD/asthma overlap (Dr. Judeth Horn), DM 2, anemia (resolved on labs 10/21).    DM: Results for orders placed or performed in visit on 05/05/23  POCT glycosylated hemoglobin (Hb A1C)  Result Value Ref Range   Hemoglobin A1C     HbA1c POC (<> result, manual entry)     HbA1c, POC (prediabetic range)     HbA1c, POC (controlled diabetic range) 6.0 0.0 - 7.0 %  POCT glucose (manual entry)  Result Value Ref Range   POC Glucose 137 (A) 70 - 99 mg/dl  Still on Jardiance 10 mg and tolerating it well.  Was down 12 pounds from January of this year on our last visit at 190 lbs.  He attributed to change in eating habits. Down additional 6 lbs today.  Still attributes to maintaining healthy eating.  Feels better and breaths better with less wgh.  BMI at 28.  No fatigue or changes in bowel habits -checks BS 2x/mth.  Good readings  COPD/asthma overlap: Patient with PFT proven overlap.  Saw his pulmonologist Dr. Judeth Horn in June.  He continues on triple therapy Breztri BID, using albuterol as needed.  Still on Claritin, Flonase and Singulair.  No major flares in last mth.  CAD/HFrEF/ICD/HTN:   Patient with recent echo revealed EF has remained 35 to 40%.  Has mild to moderate MR/MS. -Seen by cardiology NP 04/12/2023. Low K.  Given supplement.  Recheck level was nl.  Lokelma on list with prescribe date of 05/06/2021.  Patient states he is no longer taking that. -Compliant with Jardiance 10 mg daily, Entresto 97/103 mg  twice a day, bisoprolol 5 mg daily, furosemide 40 mg daily, Eplerenone 25 mg daily, Brilinta 90 mg daily, pravastatin 80 mg daily and Zetia.  Did not take medicines as yet for the morning. -Denies any chest pains or lower extremity edema.  No palpitations.  He has not had any firing of his ICD. -Patient has some intolerance to statins.  On last visit I had referred him to lipid clinic because cardiologist pharmacist was working on getting him approve for [employer].  Pt states he was called and appt was set for him to get the injection but appt was crewed up.    Insomnia:  request to be placed back on Trazodone 50 mg 1/2.  Taking the Melantoin and finds it helpful.  He finds that trazodone helps calm his nerves.  Going through a lot with his wife who has colon cancer and was recently found to have metastasis to the brain.  HM: Patient declines influenza and shingles vaccines.  Declines COVID-vaccine.  We have discussed referring him for colonoscopy on last visit but he wanted to defer as he was helping to take care of his wife who is undergoing treatment for colon cancer. Wife still undergoing treatment; wants to defer Patient Active Problem List   Diagnosis Date Noted   Controlled type 2 diabetes mellitus with hyperglycemia, without long-term current use of insulin (HCC) 08/29/2022  Hyperlipidemia 02/06/2021   Elevated troponin    NSTEMI (non-ST elevated myocardial infarction) (HCC) 02/01/2021   Non-seasonal allergic rhinitis 01/20/2021   Pulmonary emphysema (HCC) 12/03/2020   SOB (shortness of breath) 12/02/2020   History of tobacco abuse 12/02/2020   Chronic systolic heart failure (HCC) 10/05/2020   Coronary artery disease of native artery of native heart with stable angina pectoris (HCC) 10/05/2020   Gastroesophageal reflux disease without esophagitis 10/05/2020   Prediabetes 10/05/2020   Essential hypertension 06/08/2020   Obesity (BMI 30.0-34.9) 06/08/2020   Snoring 11/28/2019   Morning  headache 11/28/2019   Chronic daily headache 10/08/2019   Coronary artery disease involving left main coronary artery 05/08/2019   History of placement of stent in LAD coronary artery 05/08/2019   Diabetes mellitus type II, controlled (HCC) 05/08/2019     Current Outpatient Medications on File Prior to Visit  Medication Sig Dispense Refill   Accu-Chek Softclix Lancets lancets Use to measure blood sugar twice a day 100 each 6   albuterol (PROVENTIL) (2.5 MG/3ML) 0.083% nebulizer solution Take 3 mLs (2.5 mg total) by nebulization every 6 (six) hours as needed for wheezing or shortness of breath. 75 mL 12   albuterol (VENTOLIN HFA) 108 (90 Base) MCG/ACT inhaler INHALE 2 PUFFS BY MOUTH EVERY 6 HOURS AS NEEDED FOR WHEEZING FOR SHORTNESS OF BREATH 18 g 0   benzonatate (TESSALON) 200 MG capsule Take 1 capsule (200 mg total) by mouth 3 (three) times daily as needed. For cough 30 capsule 6   bisoprolol (ZEBETA) 5 MG tablet Take 1 tablet (5 mg total) by mouth daily. 30 tablet 11   Blood Glucose Monitoring Suppl (ACCU-CHEK GUIDE ME) w/Device KIT 1 kit by Does not apply route in the morning and at bedtime. Use to measure blood sugar twice a day 1 kit 0   Budeson-Glycopyrrol-Formoterol (BREZTRI AEROSPHERE) 160-9-4.8 MCG/ACT AERO Inhale 2 puffs into the lungs 2 (two) times daily. 1 each 11   chlorpheniramine (CHLOR-TRIMETON) 4 MG tablet 1 tab tid prn runny nose 40 tablet 0   co-enzyme Q-10 30 MG capsule Take 1 capsule (30 mg total) by mouth daily. 90 capsule 3   empagliflozin (JARDIANCE) 10 MG TABS tablet Take 1 tablet (10 mg total) by mouth daily before breakfast. 90 tablet 3   eplerenone (INSPRA) 25 MG tablet Take 1 tablet (25 mg total) by mouth daily. 90 tablet 3   EQ LORATADINE 10 MG tablet Take 1 tablet by mouth once daily 30 tablet 0   ezetimibe (ZETIA) 10 MG tablet Take 1 tablet (10 mg total) by mouth daily. 90 tablet 3   fluticasone (FLONASE) 50 MCG/ACT nasal spray USE 1 SPRAY(S) IN EACH NOSTRIL ONCE  DAILY AS NEEDED FOR ALLERGIES OR  RHINITIS 48 g 0   furosemide (LASIX) 20 MG tablet Take 2 tablets (40 mg total) by mouth daily. (Patient taking differently: Take 20 mg by mouth daily.) 180 tablet 3   glucose blood (ACCU-CHEK GUIDE) test strip Use to measure blood sugar twice a day 100 each 6   Hyprom-Naphaz-Polysorb-Zn Sulf (CLEAR EYES COMPLETE) SOLN Apply 1 drop to eye 3 (three) times daily. 15 mL 1   melatonin 1 MG TABS tablet Take 1 tablet (1 mg total) by mouth at bedtime. 30 tablet 1   nitroGLYCERIN (NITROSTAT) 0.4 MG SL tablet Place 1 tablet (0.4 mg total) under the tongue every 5 (five) minutes x 3 doses as needed for chest pain. 25 tablet 3   omeprazole (PRILOSEC) 20 MG capsule Take  1 capsule (20 mg total) by mouth daily. 90 capsule 1   potassium chloride SA (KLOR-CON M20) 20 MEQ tablet On the first day take (3 tablets) wait 4 hours then take another (3 tablets) by mouth. THEN THURSDAY 8/29 start 2 tablets by mouth 2 times daily. 90 tablet 3   pravastatin (PRAVACHOL) 80 MG tablet Take 1 tablet (80 mg total) by mouth every evening. 90 tablet 3   sacubitril-valsartan (ENTRESTO) 97-103 MG Take 1 tablet by mouth 2 (two) times daily. 180 tablet 2   ticagrelor (BRILINTA) 90 MG TABS tablet Take 1 tablet (90 mg total) by mouth 2 (two) times daily. 60 tablet 11   No current facility-administered medications on file prior to visit.    Allergies  Allergen Reactions   Atorvastatin     Muscle weakness and fatigue on 80mg  daily   Spironolactone     gynecomastia   Metformin And Related Anxiety and Palpitations   Other Anxiety and Palpitations    Social History   Socioeconomic History   Marital status: Married    Spouse name: Not on file   Number of children: 1   Years of education: 8   Highest education level: 8th grade  Occupational History   Not on file  Tobacco Use   Smoking status: Former    Current packs/day: 0.00    Types: Cigarettes    Quit date: 04/08/2019    Years  since quitting: 4.0    Passive exposure: Current (wife smokes)   Smokeless tobacco: Never  Vaping Use   Vaping status: Never Used  Substance and Sexual Activity   Alcohol use: No   Drug use: No   Sexual activity: Not Currently  Other Topics Concern   Not on file  Social History Narrative   Lives with wife   Right handed   Caffeine: about 2 cups of coffee every morning, no soda   Social Determinants of Health   Financial Resource Strain: Low Risk  (03/14/2022)   Overall Financial Resource Strain (CARDIA)    Difficulty of Paying Living Expenses: Not very hard  Food Insecurity: No Food Insecurity (03/14/2022)   Hunger Vital Sign    Worried About Running Out of Food in the Last Year: Never true    Ran Out of Food in the Last Year: Never true  Transportation Needs: No Transportation Needs (07/20/2022)   PRAPARE - Administrator, Civil Service (Medical): No    Lack of Transportation (Non-Medical): No  Physical Activity: Sufficiently Active (03/14/2022)   Exercise Vital Sign    Days of Exercise per Week: 7 days    Minutes of Exercise per Session: 60 min  Stress: No Stress Concern Present (03/14/2022)   [employer] of Occupational Health - Occupational Stress Questionnaire    Feeling of Stress : Not at all  Social Connections: Moderately Isolated (03/14/2022)   Social Connection and Isolation Panel [NHANES]    Frequency of Communication with Friends and Family: More than three times a week    Frequency of Social Gatherings with Friends and Family: More than three times a week    Attends Religious Services: Never    Database administrator or Organizations: No    Attends Banker Meetings: Never    Marital Status: Married  Catering manager Violence: Not At Risk (03/14/2022)   Humiliation, Afraid, Rape, and Kick questionnaire    Fear of Current or Ex-Partner: No    Emotionally Abused: No  Physically Abused: No    Sexually Abused: No    Family History   Problem Relation Age of Onset   Hypertension Mother    Heart failure Mother    Headache Neg Hx    Migraines Neg Hx     Past Surgical History:  Procedure Laterality Date   CARDIAC CATHETERIZATION     CORONARY BALLOON ANGIOPLASTY N/A 02/04/2021   Procedure: CORONARY BALLOON ANGIOPLASTY;  Surgeon: Lennette Bihari, MD;  Location: [facility] INVASIVE CV LAB;  Service: Cardiovascular;  Laterality: N/A;   CORONARY THROMBECTOMY N/A 02/04/2021   Procedure: Coronary Thrombectomy;  Surgeon: Lennette Bihari, MD;  Location: [facility] INVASIVE CV LAB;  Service: Cardiovascular;  Laterality: N/A;   CORONARY ULTRASOUND/IVUS N/A 02/04/2021   Procedure: Intravascular Ultrasound/IVUS;  Surgeon: Lennette Bihari, MD;  Location[facility] LAB;  Service: Cardiovascular;  Laterality: N/A;   CORONARY/GRAFT ACUTE MI REVASCULARIZATION N/A 03/31/2019   Procedure: Coronary/Graft Acute MI Revascularization;  Surgeon: Lennette Bihari, MD;  Location: [facility] INVASIVE CV LAB;  Service: Cardiovascular;  Laterality: N/A;   ICD IMPLANT N/A 04/15/2019   Procedure: ICD IMPLANT;  Surgeon: Regan Lemming, MD;  Location: [facility] INVASIVE CV LAB;  Service: Cardiovascular;  Laterality: N/A;   LEFT HEART CATH AND CORONARY ANGIOGRAPHY N/A 03/31/2019   Procedure: LEFT HEART CATH AND CORONARY ANGIOGRAPHY;  Surgeon: Lennette Bihari, MD;  Location: [facility] INVASIVE CV LAB;  Service: Cardiovascular;  Laterality: N/A;   LEFT HEART CATH AND CORONARY ANGIOGRAPHY N/A 02/02/2021   Procedure: LEFT HEART CATH AND CORONARY ANGIOGRAPHY;  Surgeon: Lennette Bihari, MD;  Location: [facility] INVASIVE CV LAB;  Service: Cardiovascular;  Laterality: N/A;   LEFT HEART CATH AND CORONARY ANGIOGRAPHY N/A 02/04/2021   Procedure: LEFT HEART CATH AND CORONARY ANGIOGRAPHY;  Surgeon: Lennette Bihari, MD;  Location: [facility] INVASIVE CV LAB;  Service: Cardiovascular;  Laterality: N/A;   NO PAST SURGERIES     RIGHT HEART CATH N/A 03/31/2019   Procedure: RIGHT HEART CATH;  Surgeon: Lennette Bihari, MD;   Location: [facility] INVASIVE CV LAB;  Service: Cardiovascular;  Laterality: N/A;   VENTRICULAR ASSIST DEVICE INSERTION N/A 03/31/2019   Procedure: VENTRICULAR ASSIST DEVICE INSERTION;  Surgeon: Lennette Bihari, MD;  Location: [facility] INVASIVE CV LAB;  Service: Cardiovascular;  Laterality: N/A;    ROS: Review of Systems Negative except as stated above  PHYSICAL EXAM: BP 138/76 (BP Location: Left Arm, Patient Position: Sitting, Cuff Size: Normal)   Pulse 66   Ht 5\' 8"  (1.727 m)   Wt 184 lb (83.5 kg)   SpO2 97%   BMI 27.98 kg/m   Wt Readings from Last 3 Encounters:  05/05/23 184 lb (83.5 kg)  04/12/23 191 lb 9.6 oz (86.9 kg)  02/03/23 192 lb 3.2 oz (87.2 kg)    Physical Exam Repeat blood pressure 140/76.  General appearance - alert, well appearing, and in no distress Mental status - normal mood, behavior, speech, dress, motor activity, and thought processes Neck - supple, no significant adenopathy Chest -breath sounds are mild to moderately decreased bilaterally.  No wheezes or crackles. Heart - normal rate, regular rhythm, normal S1, S2, no murmurs, rubs, clicks or gallops Extremities - no Le edema     Latest Ref Rng & Units 04/21/2023    8:53 AM 04/19/2023   12:09 PM 04/12/2023   10:11 AM  CMP  Glucose 70 - 99 mg/dL 784  696  295   BUN 8 - 23 mg/dL 16  18  11   Creatinine 0.61 - 1.24 mg/dL 0.98  1.19  1.47   Sodium 135 - 145 mmol/L 143  139  143   Potassium 3.5 - 5.1 mmol/L 4.0  2.5  3.1   Chloride 98 - 111 mmol/L 100  99  104   CO2 22 - 32 mmol/L 27  29  25    Calcium 8.9 - 10.3 mg/dL 9.2  8.8  9.4    Lipid Panel     Component Value Date/Time   CHOL 182 01/02/2023 1150   TRIG 106 01/02/2023 1150   HDL 41 01/02/2023 1150   CHOLHDL 4.4 01/02/2023 1150   CHOLHDL 5.6 08/12/2022 1056   VLDL 14 08/12/2022 1056   LDLCALC 122 (H) 01/02/2023 1150    CBC    Component Value Date/Time   WBC 7.7 01/02/2023 1150   WBC 9.1 07/16/2022 2240   RBC 5.31 01/02/2023 1150   RBC 4.16 (L)  07/16/2022 2240   HGB 15.6 01/02/2023 1150   HCT 48.1 01/02/2023 1150   PLT 288 01/02/2023 1150   MCV 91 01/02/2023 1150   MCH 29.4 01/02/2023 1150   MCH 29.8 07/16/2022 2240   MCHC 32.4 01/02/2023 1150   MCHC 33.2 07/16/2022 2240   RDW 13.2 01/02/2023 1150   LYMPHSABS 0.9 07/16/2022 2240   MONOABS 0.9 07/16/2022 2240   EOSABS 0.1 07/16/2022 2240   BASOSABS 0.0 07/16/2022 2240    ASSESSMENT AND PLAN: 1. Controlled type 2 diabetes mellitus with other circulatory complication, without long-term current use of insulin (HCC) -Commended him on this.  Continue healthy eating habits and trying to move is much as his COPD would allow. - POCT glycosylated hemoglobin (Hb A1C) - POCT glucose (manual entry)  2. Diabetes mellitus treated with oral medication (HCC) Continue Jardiance.  3. Hypertension associated with type 2 diabetes mellitus (HCC) Not at goal but patient has not taken medicines as yet for the morning.  He will take his medicines when he returns home.  He is on Entresto twice a day, Eplerenone 25 mg daily, Furosemide 20 mg daily and bisoprolol 5 mg daily  4. Pulmonary emphysema, unspecified emphysema type (HCC) Stable.  Continue Breztri - montelukast (SINGULAIR) 10 MG tablet; Take 1 tablet (10 mg total) by mouth at bedtime.  Dispense: 30 tablet; Refill: 6  5. Coronary artery disease involving native coronary artery of native heart without angina pectoris Stable.  Continue Brilinta, bisoprolol 5 mg daily pravastatin and Zetia  6. CHF (congestive heart failure), NYHA class II, chronic, systolic (HCC) Stable.  Continue Entresto 97/103 mg twice a day, furosemide 20 mg daily, Jardiance 10 mg daily, bisoprolol 5 mg daily and Eplerenone 25 mg daily  7. Mixed hyperlipidemia Continue pravastatin and Zetia.  8. Insomnia, unspecified type Restart low-dose trazodone. - traZODone (DESYREL) 50 MG tablet; 1/2 tab PO QHS  Dispense: 45 tablet; Refill: 1  9. Stressful life event  affecting family - traZODone (DESYREL) 50 MG tablet; 1/2 tab PO QHS  Dispense: 45 tablet; Refill: 1  10. Influenza vaccination declined Recommended.  Patient declined.  11. COVID-19 vaccination declined Recommended.  Patient declined.  We will defer on colon cancer screening until next visit per patient's request.  Patient was given the opportunity to ask questions.  Patient verbalized understanding of the plan and was able to repeat key elements of the plan.   This documentation was completed using Paediatric nurse.  Any transcriptional errors are unintentional.  Orders Placed This Encounter  Procedures  POCT glycosylated hemoglobin (Hb A1C)   POCT glucose (manual entry)     Requested Prescriptions   Signed Prescriptions Disp Refills   montelukast (SINGULAIR) 10 MG tablet 30 tablet 6    Sig: Take 1 tablet (10 mg total) by mouth at bedtime.   traZODone (DESYREL) 50 MG tablet 45 tablet 1    Sig: 1/2 tab PO QHS    Return in about 4 months (around 09/04/2023) for Medicare Wellness Visit in   2-4 wks with CMA.  Jonah Blue, MD, FACP

## 2023-05-09 ENCOUNTER — Encounter: Payer: Self-pay | Admitting: Internal Medicine

## 2023-05-23 ENCOUNTER — Other Ambulatory Visit: Payer: Self-pay | Admitting: Pulmonary Disease

## 2023-05-23 DIAGNOSIS — J439 Emphysema, unspecified: Secondary | ICD-10-CM

## 2023-05-24 ENCOUNTER — Other Ambulatory Visit: Payer: Self-pay | Admitting: Internal Medicine

## 2023-05-24 DIAGNOSIS — J3089 Other allergic rhinitis: Secondary | ICD-10-CM

## 2023-05-24 MED ORDER — FLUTICASONE PROPIONATE 50 MCG/ACT NA SUSP
NASAL | 0 refills | Status: DC
Start: 2023-05-24 — End: 2023-08-18

## 2023-05-29 ENCOUNTER — Ambulatory Visit: Payer: Medicare HMO | Attending: Cardiology

## 2023-05-29 DIAGNOSIS — Z95810 Presence of automatic (implantable) cardiac defibrillator: Secondary | ICD-10-CM | POA: Diagnosis not present

## 2023-05-29 DIAGNOSIS — I5022 Chronic systolic (congestive) heart failure: Secondary | ICD-10-CM | POA: Diagnosis not present

## 2023-06-02 NOTE — Progress Notes (Signed)
EPIC Encounter for ICM Monitoring  Patient Name: Stephen Jennings is a 67 y.o. male Date: 06/02/2023 Primary Care Physican: Marcine Matar, MD Primary Cardiologist: Kelly/Bensimhon Electrophysiologist: [facility] 08/24/2022 Weight: 194 lbs (baseline 194-195) 01/24/2023 Weight: 188-190 lbs 03/20/2023 Weight: 188-190 lbs                                                            Spoke with patient and heart failure questions reviewed.  Transmission results reviewed.  Pt asymptomatic for fluid accumulation.   He admits to missing some of his Lasix dosages.    CorVue thoracic impedance suggesting possible fluid accumulation from 9/10-9/21 & 9/25-9/27.   Prescribed:  Furosemide 20 mg take 2 tablet(s) (40 mg total) by mouth daily.  Per HF clinic note on 8/21 was instructed to Increase lasix to 40 mg daily x 3 days then back to 20 mg daily (not updated in EPIC).  Potassium 20 mEq take 2 tablets (40 mEq total) by mouth twice a day Eplerenone (inspra) 25 mg take 1 tablet(s) (25 mg total) by mouth daily   Labs: 04/21/2023 Creatinine 0.93, BUN 16, Potassium 4.0, Sodium 143, GFR >60 04/19/2023 Creatinine 1.16, BUN 18, Potassium 2.5, Sodium 139, GFR >60 (low K+ addressed by HF clinic) 04/12/2023 Creatinine 0.89, BUN 11, Potassium 3.1, Sodium 143, GFR >60  09/12/2022 Creatinine 1.09, BUN 15, Potassium 3.9, Sodium 143, GFR 75 08/29/2022 Creatinine 1.01, BUN 14, Potassium 3.2, Sodium 141, GFR >60 08/12/2022 Creatinine 1.17, BUN 19, Potassium 3.4, Sodium 140, GFR >60 A complete set of results can be found in Results Review.   Recommendations:  Encouraged to set up reminder for taking meds as prescribed to decrease risk of hospitalization.    Follow-up plan: ICM clinic phone appointment on 07/03/2023.   91 day device clinic remote transmission 07/19/2023.     EP/Cardiology Office Visits:    Need 3 month appt with Dr Gala Romney following 04/12/2023 HF Clinic visit.  Recall 09/13/2023 with Dr Tresa Endo.   Recall  09/23/2023 with Dr Elberta Fortis.   Copy of ICM check sent to Dr. Elberta Fortis  3 month ICM trend: 05/29/2023.    12-14 Month ICM trend:     Karie Soda, RN 06/02/2023 1:14 PM

## 2023-06-04 ENCOUNTER — Other Ambulatory Visit: Payer: Self-pay | Admitting: Pulmonary Disease

## 2023-06-04 DIAGNOSIS — J329 Chronic sinusitis, unspecified: Secondary | ICD-10-CM

## 2023-06-04 DIAGNOSIS — J439 Emphysema, unspecified: Secondary | ICD-10-CM

## 2023-06-26 ENCOUNTER — Telehealth: Payer: Self-pay | Admitting: Pharmacy Technician

## 2023-06-26 NOTE — Telephone Encounter (Signed)
Auth Submission: APPROVED [place] RENEWAL Site of care: Site of care: [facility] Payer: [facility] MEDICARE Medication & CPT/J Code(s) submitted: Leqvio (Inclisiran) J1306 Route of submission (phone, fax, portal): PORTAL Phone # Fax # Auth type: Buy/Bill PB Units/visits requested: 284 Q6 MONTHS Reference number: [id number] Approval from: 08/23/23 to 08/21/24

## 2023-06-27 ENCOUNTER — Telehealth (HOSPITAL_COMMUNITY): Payer: Self-pay | Admitting: Cardiology

## 2023-06-27 NOTE — Telephone Encounter (Signed)
Patient called to request letter to DOT-outlining his medications and explaining they will not interfere with him driving Reports he was cleared at the DOT physical however they requested a letter from cardiology.    Pt will pick up once complete

## 2023-07-03 ENCOUNTER — Ambulatory Visit: Payer: Medicare HMO | Attending: Cardiology

## 2023-07-03 DIAGNOSIS — Z95810 Presence of automatic (implantable) cardiac defibrillator: Secondary | ICD-10-CM

## 2023-07-03 DIAGNOSIS — I5022 Chronic systolic (congestive) heart failure: Secondary | ICD-10-CM | POA: Diagnosis not present

## 2023-07-05 NOTE — Progress Notes (Signed)
EPIC Encounter for ICM Monitoring  Patient Name: Stephen Jennings is a 67 y.o. male Date: 07/05/2023 Primary Care Physican: Marcine Matar, MD Primary Cardiologist: Kelly/Bensimhon Electrophysiologist: [facility] 08/24/2022 Weight: 194 lbs (baseline 194-195) 01/24/2023 Weight: 188-190 lbs 03/20/2023 Weight: 188-190 lbs 07/05/2023 Weight: 189-190 lbs                                                            Spoke with patient and heart failure questions reviewed.  Transmission results reviewed.  Pt asymptomatic for fluid accumulation.   He reports taking Lasix most days.  Explained importance of keeping fluid levels balanced.  Diet:  11/13 Discussed limiting fluid and salt.   He reports he does drink a lot of water.    CorVue thoracic impedance suggesting possible fluid accumulation starting 11/9 and returning to baseline 11/13.  Also suggesting possible fluid accumulation from 10/14-10/29.   Prescribed:  Furosemide 20 mg take 2 tablet(s) (40 mg total) by mouth daily.  Per HF clinic note on 8/21 was instructed to Increase lasix to 40 mg daily x 3 days then back to 20 mg daily (not updated in EPIC).  Potassium 20 mEq take 2 tablets (40 mEq total) by mouth twice a day Eplerenone (inspra) 25 mg take 1 tablet(s) (25 mg total) by mouth daily   Labs: 04/21/2023 Creatinine 0.93, BUN 16, Potassium 4.0, Sodium 143, GFR >60 04/19/2023 Creatinine 1.16, BUN 18, Potassium 2.5, Sodium 139, GFR >60 (low K+ addressed by HF clinic) 04/12/2023 Creatinine 0.89, BUN 11, Potassium 3.1, Sodium 143, GFR >60  09/12/2022 Creatinine 1.09, BUN 15, Potassium 3.9, Sodium 143, GFR 75 08/29/2022 Creatinine 1.01, BUN 14, Potassium 3.2, Sodium 141, GFR >60 08/12/2022 Creatinine 1.17, BUN 19, Potassium 3.4, Sodium 140, GFR >60 A complete set of results can be found in Results Review.   Recommendations:  Advised to take meds as prescribed without skipping dosages.    Follow-up plan: ICM clinic phone appointment on  08/21/2023.   91 day device clinic remote transmission 07/19/2023.     EP/Cardiology Office Visits:    Need 3 month appt with Dr Gala Romney following 04/12/2023 HF Clinic visit.  Recall 09/13/2023 with Dr Tresa Endo.   10/13/2023 with Dr Elberta Fortis.   Copy of ICM check sent to Dr. Elberta Fortis  Direct Trend View:   3 month ICM trend: 07/03/2023.    12-14 Month ICM trend:     Karie Soda, RN 07/05/2023 4:18 PM

## 2023-07-11 ENCOUNTER — Ambulatory Visit: Payer: Medicare HMO | Attending: Internal Medicine

## 2023-07-11 VITALS — Ht 68.0 in | Wt 186.0 lb

## 2023-07-11 DIAGNOSIS — Z0000 Encounter for general adult medical examination without abnormal findings: Secondary | ICD-10-CM | POA: Diagnosis not present

## 2023-07-11 NOTE — Progress Notes (Signed)
Subjective:   Stephen Jennings is a 67 y.o. male who presents for Medicare Annual/Subsequent preventive examination.  Visit Complete: Virtual I connected with  Stephen Jennings on 07/11/23 by a audio enabled telemedicine application and verified that I am speaking with the correct person using two identifiers.  Patient Location: Home  Provider Location: Office/Clinic  I discussed the limitations of evaluation and management by telemedicine. The patient expressed understanding and agreed to proceed.  Vital Signs: Because this visit was a virtual/telehealth visit, some criteria may be missing or patient reported. Any vitals not documented were not able to be obtained and vitals that have been documented are patient reported.  Cardiac Risk Factors include: advanced age (>37men, >61 women)     Objective:    Today's Vitals   07/11/23 0843  Weight: 186 lb (84.4 kg)  Height: 5\' 8"  (1.727 m)  PainSc: 0-No pain   Body mass index is 28.28 kg/m.     07/11/2023    8:45 AM 03/14/2022   10:49 AM 08/23/2021    2:08 PM 02/02/2021   12:04 AM 03/31/2019   11:06 AM  Advanced Directives  Does Patient Have a Medical Advance Directive? No No No No Unable to assess, patient is non-responsive or altered mental status  Would patient like information on creating a medical advance directive? No - Patient declined No - Patient declined No - Patient declined No - Patient declined     Current Medications (verified) Outpatient Encounter Medications as of 07/11/2023  Medication Sig   Accu-Chek Softclix Lancets lancets Use to measure blood sugar twice a day   albuterol (PROVENTIL) (2.5 MG/3ML) 0.083% nebulizer solution Take 3 mLs (2.5 mg total) by nebulization every 6 (six) hours as needed for wheezing or shortness of breath.   albuterol (VENTOLIN HFA) 108 (90 Base) MCG/ACT inhaler INHALE 2 PUFFS BY MOUTH EVERY 6 HOURS AS NEEDED FOR WHEEZING FOR SHORTNESS OF BREATH   benzonatate (TESSALON) 200 MG capsule Take 1  capsule (200 mg total) by mouth 3 (three) times daily as needed. For cough   bisoprolol (ZEBETA) 5 MG tablet Take 1 tablet (5 mg total) by mouth daily.   Blood Glucose Monitoring Suppl (ACCU-CHEK GUIDE ME) w/Device KIT 1 kit by Does not apply route in the morning and at bedtime. Use to measure blood sugar twice a day   Budeson-Glycopyrrol-Formoterol (BREZTRI AEROSPHERE) 160-9-4.8 MCG/ACT AERO Inhale 2 puffs into the lungs 2 (two) times daily.   chlorpheniramine (CHLOR-TRIMETON) 4 MG tablet 1 tab tid prn runny nose   co-enzyme Q-10 30 MG capsule Take 1 capsule (30 mg total) by mouth daily.   empagliflozin (JARDIANCE) 10 MG TABS tablet Take 1 tablet (10 mg total) by mouth daily before breakfast.   eplerenone (INSPRA) 25 MG tablet Take 1 tablet (25 mg total) by mouth daily.   ezetimibe (ZETIA) 10 MG tablet Take 1 tablet (10 mg total) by mouth daily.   fluticasone (FLONASE) 50 MCG/ACT nasal spray USE 1 SPRAY(S) IN EACH NOSTRIL ONCE DAILY AS NEEDED FOR ALLERGIES OR  RHINITIS   furosemide (LASIX) 20 MG tablet Take 2 tablets (40 mg total) by mouth daily. (Patient taking differently: Take 20 mg by mouth daily.)   glucose blood (ACCU-CHEK GUIDE) test strip Use to measure blood sugar twice a day   Hyprom-Naphaz-Polysorb-Zn Sulf (CLEAR EYES COMPLETE) SOLN Apply 1 drop to eye 3 (three) times daily.   loratadine (CLARITIN) 10 MG tablet Take 1 tablet by mouth once daily   melatonin 1 MG TABS  tablet Take 1 tablet (1 mg total) by mouth at bedtime.   montelukast (SINGULAIR) 10 MG tablet Take 1 tablet (10 mg total) by mouth at bedtime.   nitroGLYCERIN (NITROSTAT) 0.4 MG SL tablet Place 1 tablet (0.4 mg total) under the tongue every 5 (five) minutes x 3 doses as needed for chest pain.   omeprazole (PRILOSEC) 20 MG capsule Take 1 capsule (20 mg total) by mouth daily.   potassium chloride SA (KLOR-CON M20) 20 MEQ tablet On the first day take (3 tablets) wait 4 hours then take another (3 tablets) by mouth.  THEN THURSDAY 8/29 start 2 tablets by mouth 2 times daily.   pravastatin (PRAVACHOL) 80 MG tablet Take 1 tablet (80 mg total) by mouth every evening.   sacubitril-valsartan (ENTRESTO) 97-103 MG Take 1 tablet by mouth 2 (two) times daily.   ticagrelor (BRILINTA) 90 MG TABS tablet Take 1 tablet (90 mg total) by mouth 2 (two) times daily.   traZODone (DESYREL) 50 MG tablet 1/2 tab PO QHS   No facility-administered encounter medications on file as of 07/11/2023.    Allergies (verified) Atorvastatin, Spironolactone, Metformin and related, and Other   History: Past Medical History:  Diagnosis Date   Cardiogenic shock (HCC)    CHF (congestive heart failure) (HCC)    Coronary artery disease    Diabetes mellitus without complication (HCC)    Hyperlipidemia    Medical history non-contributory    Past Surgical History:  Procedure Laterality Date   CARDIAC CATHETERIZATION     CORONARY BALLOON ANGIOPLASTY N/A 02/04/2021   Procedure: CORONARY BALLOON ANGIOPLASTY;  Surgeon: Lennette Bihari, MD;  Location: [facility] INVASIVE CV LAB;  Service: Cardiovascular;  Laterality: N/A;   CORONARY THROMBECTOMY N/A 02/04/2021   Procedure: Coronary Thrombectomy;  Surgeon: Lennette Bihari, MD;  Location: [facility] INVASIVE CV LAB;  Service: Cardiovascular;  Laterality: N/A;   CORONARY ULTRASOUND/IVUS N/A 02/04/2021   Procedure: Intravascular Ultrasound/IVUS;  Surgeon: Lennette Bihari, MD;  Location: [facility] INVASIVE CV LAB;  Service: Cardiovascular;  Laterality: N/A;   CORONARY/GRAFT ACUTE MI REVASCULARIZATION N/A 03/31/2019   Procedure: Coronary/Graft Acute MI Revascularization;  Surgeon: Lennette Bihari, MD;  Location: [facility] INVASIVE CV LAB;  Service: Cardiovascular;  Laterality: N/A;   ICD IMPLANT N/A 04/15/2019   Procedure: ICD IMPLANT;  Surgeon: Regan Lemming, MD;  Location: [facility] INVASIVE CV LAB;  Service: Cardiovascular;  Laterality: N/A;   LEFT HEART CATH AND CORONARY ANGIOGRAPHY N/A 03/31/2019   Procedure: LEFT HEART CATH AND  CORONARY ANGIOGRAPHY;  Surgeon: Lennette Bihari, MD;  Location: [facility] INVASIVE CV LAB;  Service: Cardiovascular;  Laterality: N/A;   LEFT HEART CATH AND CORONARY ANGIOGRAPHY N/A 02/02/2021   Procedure: LEFT HEART CATH AND CORONARY ANGIOGRAPHY;  Surgeon: Lennette Bihari, MD;  Location: [facility] INVASIVE CV LAB;  Service: Cardiovascular;  Laterality: N/A;   LEFT HEART CATH AND CORONARY ANGIOGRAPHY N/A 02/04/2021   Procedure: LEFT HEART CATH AND CORONARY ANGIOGRAPHY;  Surgeon: Lennette Bihari, MD;  Location: [facility] INVASIVE CV LAB;  Service: Cardiovascular;  Laterality: N/A;   NO PAST SURGERIES     RIGHT HEART CATH N/A 03/31/2019   Procedure: RIGHT HEART CATH;  Surgeon: Lennette Bihari, MD;  Location: [facility] INVASIVE CV LAB;  Service: Cardiovascular;  Laterality: N/A;   VENTRICULAR ASSIST DEVICE INSERTION N/A 03/31/2019   Procedure: VENTRICULAR ASSIST DEVICE INSERTION;  Surgeon: Lennette Bihari, MD;  Location: [facility] INVASIVE CV LAB;  Service: Cardiovascular;  Laterality: N/A;   Family History  Problem Relation Age of Onset   Hypertension Mother    Heart failure Mother    Headache Neg Hx    Migraines Neg Hx    Social History   Socioeconomic History   Marital status: Married    Spouse name: Not on file   Number of children: 1   Years of education: 8   Highest education level: 8th grade  Occupational History   Not on file  Tobacco Use   Smoking status: Former    Current packs/day: 0.00    Types: Cigarettes    Quit date: 04/08/2019    Years since quitting: 4.2    Passive exposure: Current (wife smokes)   Smokeless tobacco: Never  Vaping Use   Vaping status: Never Used  Substance and Sexual Activity   Alcohol use: No   Drug use: No   Sexual activity: Not Currently  Other Topics Concern   Not on file  Social History Narrative   Lives with wife   Right handed   Caffeine: about 2 cups of coffee every morning, no soda   Social Determinants of Health   Financial Resource Strain: Low Risk  (07/11/2023)    Overall Financial Resource Strain (CARDIA)    Difficulty of Paying Living Expenses: Not very hard  Food Insecurity: No Food Insecurity (07/11/2023)   Hunger Vital Sign    Worried About Running Out of Food in the Last Year: Never true    Ran Out of Food in the Last Year: Never true  Transportation Needs: No Transportation Needs (07/11/2023)   PRAPARE - Administrator, Civil Service (Medical): No    Lack of Transportation (Non-Medical): No  Physical Activity: Sufficiently Active (07/11/2023)   Exercise Vital Sign    Days of Exercise per Week: 7 days    Minutes of Exercise per Session: 60 min  Stress: No Stress Concern Present (07/11/2023)   [employer] of Occupational Health - Occupational Stress Questionnaire    Feeling of Stress : Not at all  Social Connections: Moderately Isolated (07/11/2023)   Social Connection and Isolation Panel [NHANES]    Frequency of Communication with Friends and Family: More than three times a week    Frequency of Social Gatherings with Friends and Family: More than three times a week    Attends Religious Services: Never    Database administrator or Organizations: No    Attends Engineer, structural: Never    Marital Status: Married    Tobacco Counseling Counseling given: Not Answered   Clinical Intake:  Pre-visit preparation completed: Yes  Pain : No/denies pain Pain Score: 0-No pain     BMI - recorded: 28.28 Nutritional Status: BMI 25 -29 Overweight Nutritional Risks: None Diabetes: Yes CBG done?: No Did pt. bring in CBG monitor from home?: No  How often do you need to have someone help you when you read instructions, pamphlets, or other written materials from your doctor or pharmacy?: 1 - Never What is the last grade level you completed in school?: 8th grade  Interpreter Needed?: No  Information entered by :: Aimee Timmons N. Genie Mirabal, LPN.   Activities of Daily Living    07/11/2023    8:47 AM  In your present  state of health, do you have any difficulty performing the following activities:  Hearing? 0  Vision? 0  Difficulty concentrating or making decisions? 0  Walking or climbing stairs? 0  Dressing or bathing? 0  Doing errands, shopping? 0  Preparing Food  and eating ? N  Using the Toilet? N  In the past six months, have you accidently leaked urine? N  Do you have problems with loss of bowel control? N  Managing your Medications? N  Managing your Finances? N  Housekeeping or managing your Housekeeping? N    Patient Care Team: Marcine Matar, MD as PCP - General (Internal Medicine) Lennette Bihari, MD as PCP - Cardiology (Cardiology) Regan Lemming, MD as PCP - Electrophysiology (Cardiology)  Indicate any recent Medical Services you may have received from other than [facility] providers in the past year (date may be approximate).     Assessment:   This is a routine wellness examination for Stephen Jennings.  Hearing/Vision screen Hearing Screening - Comments:: Patient denied any hearing difficulty.   No hearing aids.  Vision Screening - Comments:: Patient does wear corrective lenses/contacts/readers.  Annual eye exam done by: Jethro Bolus, MD. (11/07/2022)     Goals Addressed             This Visit's Progress    Client understands the importance of follow-up with providers by attending scheduled visits        Depression Screen    07/11/2023    8:46 AM 05/05/2023    9:08 AM 01/02/2023   10:49 AM 08/29/2022   11:40 AM 03/14/2022   10:47 AM 07/19/2021    3:49 PM 06/16/2021    3:04 PM  PHQ 2/9 Scores  PHQ - 2 Score 0 0 0 0 0 0 0  PHQ- 9 Score 0 0 0 0       Fall Risk    07/11/2023    8:45 AM 01/02/2023   10:42 AM 08/29/2022   11:35 AM 08/17/2022    2:35 PM 04/29/2022    3:08 PM  Fall Risk   Falls in the past year? 0 0 0 0 0  Number falls in past yr: 0 0 0 0 0  Injury with Fall? 0 0 0 0 0  Risk for fall due to : No Fall Risks No Fall Risks No Fall Risks No Fall Risks No Fall  Risks  Follow up Falls prevention discussed    Falls evaluation completed    MEDICARE RISK AT HOME: Medicare Risk at Home Any stairs in or around the home?: No If so, are there any without handrails?: No Home free of loose throw rugs in walkways, pet beds, electrical cords, etc?: Yes Adequate lighting in your home to reduce risk of falls?: Yes Life alert?: No Use of a cane, walker or w/c?: No Grab bars in the bathroom?: Yes Shower chair or bench in shower?: Yes Elevated toilet seat or a handicapped toilet?: Yes  TIMED UP AND GO:  Was the test performed?  No    Cognitive Function:    07/11/2023    8:46 AM 03/14/2022   11:14 AM 03/14/2022   10:50 AM  MMSE - Mini Mental State Exam  Not completed: Unable to complete Unable to complete Unable to complete        07/11/2023    8:46 AM 03/14/2022   10:50 AM  6CIT Screen  What Year? 0 points 0 points  What month? 0 points 0 points  What time? 0 points 0 points  Count back from 20 0 points 0 points  Months in reverse 0 points 0 points  Repeat phrase 0 points 0 points  Total Score 0 points 0 points    Immunizations Immunization History  Administered Date(s) Administered  Influenza,inj,Quad PF,6+ Mos 05/08/2019, 06/08/2020, 04/23/2021   PFIZER(Purple Top)SARS-COV-2 Vaccination 11/04/2019, 11/25/2019   PNEUMOCOCCAL CONJUGATE-20 04/23/2021   Pneumococcal Polysaccharide-23 06/05/2019   Tetanus 05/08/2019    TDAP status: Up to date  Flu Vaccine status: Due, Education has been provided regarding the importance of this vaccine. Advised may receive this vaccine at local pharmacy or Health Dept. Aware to provide a copy of the vaccination record if obtained from local pharmacy or Health Dept. Verbalized acceptance and understanding.  Pneumococcal vaccine status: Up to date  Covid-19 vaccine status: Completed vaccines  Qualifies for Shingles Vaccine? Yes   Zostavax completed No   Shingrix Completed?: No.    Education has been  provided regarding the importance of this vaccine. Patient has been advised to call insurance company to determine out of pocket expense if they have not yet received this vaccine. Advised may also receive vaccine at local pharmacy or Health Dept. Verbalized acceptance and understanding.  Screening Tests Health Maintenance  Topic Date Due   FOOT EXAM  Never done   COVID-19 Vaccine (3 - 2023-24 season) 04/23/2023   Zoster Vaccines- Shingrix (1 of 2) 08/04/2023 (Originally 05/23/2006)   COLON CANCER SCREENING ANNUAL FOBT  09/23/2023 (Originally 05/23/2001)   Colonoscopy  09/23/2023 (Originally 05/23/2001)   INFLUENZA VACCINE  11/20/2023 (Originally 03/23/2023)   Diabetic kidney evaluation - Urine ACR  08/30/2023   HEMOGLOBIN A1C  11/02/2023   OPHTHALMOLOGY EXAM  11/07/2023   Diabetic kidney evaluation - eGFR measurement  04/20/2024   Medicare Annual Wellness (AWV)  07/10/2024   Pneumonia Vaccine 48+ Years old  Completed   Hepatitis C Screening  Completed   HPV VACCINES  Aged Out   DTaP/Tdap/Td  Discontinued    Health Maintenance  Health Maintenance Due  Topic Date Due   FOOT EXAM  Never done   COVID-19 Vaccine (3 - 2023-24 season) 04/23/2023    Colorectal cancer screening: Darrold Junker done.  Lung Cancer Screening: (Low Dose CT Chest recommended if Age 50-80 years, 20 pack-year currently smoking OR have quit w/in 15years.) does not qualify.   Lung Cancer Screening Referral: no  Additional Screening:  Hepatitis C Screening: does qualify; Completed 05/08/2019  Vision Screening: Recommended annual ophthalmology exams for early detection of glaucoma and other disorders of the eye. Is the patient up to date with their annual eye exam?  Yes  Who is the provider or what is the name of the office in which the patient attends annual eye exams? Jethro Bolus, MD.  If pt is not established with a provider, would they like to be referred to a provider to establish care? No .   Dental Screening:  Recommended annual dental exams for proper oral hygiene  Diabetic Foot Exam: Diabetic Foot Exam: Overdue, Pt has been advised about the importance in completing this exam. Pt is scheduled for diabetic foot exam on 07/08/2024 with pcp.  Community Resource Referral / Chronic Care Management: CRR required this visit?  No   CCM required this visit?  No     Plan:     I have personally reviewed and noted the following in the patient's chart:   Medical and social history Use of alcohol, tobacco or illicit drugs  Current medications and supplements including opioid prescriptions. Patient is not currently taking opioid prescriptions. Functional ability and status Nutritional status Physical activity Advanced directives List of other physicians Hospitalizations, surgeries, and ER visits in previous 12 months Vitals Screenings to include cognitive, depression, and falls Referrals and appointments  In  addition, I have reviewed and discussed with patient certain preventive protocols, quality metrics, and best practice recommendations. A written personalized care plan for preventive services as well as general preventive health recommendations were provided to patient.     Mickeal Needy, LPN   11/91/4782   After Visit Summary: (Declined) Due to this being a telephonic visit, with patients personalized plan was offered to patient but patient Declined AVS at this time   Nurse Notes: None

## 2023-07-11 NOTE — Patient Instructions (Signed)
Stephen Jennings , Thank you for taking time to come for your Medicare Wellness Visit. I appreciate your ongoing commitment to your health goals. Please review the following plan we discussed and let me know if I can assist you in the future.   Referrals/Orders/Follow-Ups/Clinician Recommendations: No  This is a list of the screening recommended for you and due dates:  Health Maintenance  Topic Date Due   Complete foot exam   Never done   COVID-19 Vaccine (3 - 2023-24 season) 04/23/2023   Zoster (Shingles) Vaccine (1 of 2) 08/04/2023*   Stool Blood Test  09/23/2023*   Colon Cancer Screening  09/23/2023*   Flu Shot  11/20/2023*   Yearly kidney health urinalysis for diabetes  08/30/2023   Hemoglobin A1C  11/02/2023   Eye exam for diabetics  11/07/2023   Yearly kidney function blood test for diabetes  04/20/2024   Medicare Annual Wellness Visit  07/10/2024   Pneumonia Vaccine  Completed   Hepatitis C Screening  Completed   HPV Vaccine  Aged Out   DTaP/Tdap/Td vaccine  Discontinued  *Topic was postponed. The date shown is not the original due date.    Advanced directives: (Declined) Advance directive discussed with you today. Even though you declined this today, please call our office should you change your mind, and we can give you the proper paperwork for you to fill out.  Next Medicare Annual Wellness Visit scheduled for next year: Yes

## 2023-07-13 ENCOUNTER — Encounter (HOSPITAL_COMMUNITY): Payer: Medicare HMO | Admitting: Internal Medicine

## 2023-07-19 ENCOUNTER — Ambulatory Visit (INDEPENDENT_AMBULATORY_CARE_PROVIDER_SITE_OTHER): Payer: Medicare HMO

## 2023-07-19 DIAGNOSIS — I255 Ischemic cardiomyopathy: Secondary | ICD-10-CM | POA: Diagnosis not present

## 2023-07-19 LAB — CUP PACEART REMOTE DEVICE CHECK
Battery Remaining Longevity: 61 mo
Battery Remaining Percentage: 61 %
Battery Voltage: 2.96 V
Brady Statistic AP VP Percent: 1 %
Brady Statistic AP VS Percent: 11 %
Brady Statistic AS VP Percent: 1 %
Brady Statistic AS VS Percent: 89 %
Brady Statistic RA Percent Paced: 10 %
Brady Statistic RV Percent Paced: 1 %
Date Time Interrogation Session: 20241127040015
HighPow Impedance: 66 Ohm
HighPow Impedance: 66 Ohm
Implantable Lead Connection Status: 753985
Implantable Lead Connection Status: 753985
Implantable Lead Implant Date: 20200824
Implantable Lead Implant Date: 20200824
Implantable Lead Location: 753859
Implantable Lead Location: 753860
Implantable Pulse Generator Implant Date: 20200824
Lead Channel Impedance Value: 330 Ohm
Lead Channel Impedance Value: 360 Ohm
Lead Channel Pacing Threshold Amplitude: 0.5 V
Lead Channel Pacing Threshold Amplitude: 1.25 V
Lead Channel Pacing Threshold Pulse Width: 0.5 ms
Lead Channel Pacing Threshold Pulse Width: 0.5 ms
Lead Channel Sensing Intrinsic Amplitude: 1 mV
Lead Channel Sensing Intrinsic Amplitude: 11.9 mV
Lead Channel Setting Pacing Amplitude: 2 V
Lead Channel Setting Pacing Amplitude: 2.5 V
Lead Channel Setting Pacing Pulse Width: 0.5 ms
Lead Channel Setting Sensing Sensitivity: 0.5 mV
Pulse Gen Serial Number: 1300954

## 2023-07-28 ENCOUNTER — Other Ambulatory Visit: Payer: Self-pay | Admitting: Pulmonary Disease

## 2023-07-28 DIAGNOSIS — J439 Emphysema, unspecified: Secondary | ICD-10-CM

## 2023-07-28 DIAGNOSIS — J329 Chronic sinusitis, unspecified: Secondary | ICD-10-CM

## 2023-07-28 NOTE — Telephone Encounter (Signed)
It is not mentioned in the last office visit note if you want the patient to continue the albuterol. Do you want to approve or deny request?

## 2023-08-17 ENCOUNTER — Other Ambulatory Visit (HOSPITAL_COMMUNITY): Payer: Self-pay | Admitting: Family Medicine

## 2023-08-18 ENCOUNTER — Other Ambulatory Visit: Payer: Self-pay | Admitting: Internal Medicine

## 2023-08-18 DIAGNOSIS — J3089 Other allergic rhinitis: Secondary | ICD-10-CM

## 2023-08-21 ENCOUNTER — Ambulatory Visit: Payer: Medicare HMO | Attending: Cardiology

## 2023-08-21 DIAGNOSIS — I5022 Chronic systolic (congestive) heart failure: Secondary | ICD-10-CM | POA: Diagnosis not present

## 2023-08-21 DIAGNOSIS — Z95810 Presence of automatic (implantable) cardiac defibrillator: Secondary | ICD-10-CM

## 2023-08-24 ENCOUNTER — Other Ambulatory Visit: Payer: Self-pay | Admitting: Pulmonary Disease

## 2023-08-24 ENCOUNTER — Telehealth: Payer: Self-pay

## 2023-08-24 DIAGNOSIS — J329 Chronic sinusitis, unspecified: Secondary | ICD-10-CM

## 2023-08-24 DIAGNOSIS — J439 Emphysema, unspecified: Secondary | ICD-10-CM

## 2023-08-24 NOTE — Telephone Encounter (Signed)
 Remote ICM transmission received.  Attempted call to patient regarding ICM remote transmission and no answer or voice mail option.

## 2023-08-28 ENCOUNTER — Ambulatory Visit: Payer: Medicare (Managed Care) | Attending: Cardiology

## 2023-08-28 DIAGNOSIS — I5022 Chronic systolic (congestive) heart failure: Secondary | ICD-10-CM

## 2023-08-28 DIAGNOSIS — Z95810 Presence of automatic (implantable) cardiac defibrillator: Secondary | ICD-10-CM

## 2023-08-28 NOTE — Progress Notes (Signed)
 EPIC Encounter for ICM Monitoring  Patient Name: Stephen Jennings is a 68 y.o. male Date: 08/28/2023 Primary Care Physican: Vicci Barnie NOVAK, MD Primary Cardiologist: Kelly/Bensimhon Electrophysiologist: [facility] 08/24/2022 Weight: 194 lbs (baseline 194-195) 01/24/2023 Weight: 188-190 lbs 03/20/2023 Weight: 188-190 lbs 07/05/2023 Weight: 189-190 lbs                                                           Attempted call to patient and unable to reach.   Transmission results reviewed.    Diet:  11/13 Discussed limiting fluid and salt.   He reports he does drink a lot of water .    CorVue thoracic impedance suggesting fluid levels returned to normal on 1/2 and possible fluid accumulation restarted 08/26/2023.   Prescribed:  Furosemide  20 mg take 2 tablet(s) (40 mg total) by mouth daily.  Per HF clinic note on 8/21 was instructed to Increase lasix  to 40 mg daily x 3 days then back to 20 mg daily (not updated in EPIC).  Potassium 20 mEq take 2 tablets (40 mEq total) by mouth twice a day Eplerenone  (inspra ) 25 mg take 1 tablet(s) (25 mg total) by mouth daily   Labs: 04/21/2023 Creatinine 0.93, BUN 16, Potassium 4.0, Sodium 143, GFR >60 04/19/2023 Creatinine 1.16, BUN 18, Potassium 2.5, Sodium 139, GFR >60 (low K+ addressed by HF clinic) 04/12/2023 Creatinine 0.89, BUN 11, Potassium 3.1, Sodium 143, GFR >60  09/12/2022 Creatinine 1.09, BUN 15, Potassium 3.9, Sodium 143, GFR 75 08/29/2022 Creatinine 1.01, BUN 14, Potassium 3.2, Sodium 141, GFR >60 08/12/2022 Creatinine 1.17, BUN 19, Potassium 3.4, Sodium 140, GFR >60 A complete set of results can be found in Results Review.   Recommendations:  Unable to reach.      Follow-up plan: ICM clinic phone appointment on 09/04/2023 to recheck fluid levels.   91 day device clinic remote transmission 10/18/2023.     EP/Cardiology Office Visits:   Need 3 month appt with Dr Cherrie following 04/12/2023 HF Clinic visit.  10/02/2023 with Dr Burnard.   10/13/2023 with  Dr Inocencio.   Copy of ICM check sent to Dr. Inocencio.   3 month ICM trend: 08/28/2023.    12-14 Month ICM trend:     Mitzie GORMAN Garner, RN 08/28/2023 1:31 PM

## 2023-08-29 ENCOUNTER — Telehealth: Payer: Self-pay

## 2023-08-29 ENCOUNTER — Encounter: Payer: Self-pay | Admitting: Cardiovascular Disease

## 2023-08-29 NOTE — Telephone Encounter (Signed)
 Remote ICM transmission received.  Attempted call to patient regarding ICM remote transmission and no answer.

## 2023-09-04 ENCOUNTER — Other Ambulatory Visit (HOSPITAL_COMMUNITY): Payer: Self-pay | Admitting: Cardiology

## 2023-09-04 MED ORDER — FUROSEMIDE 20 MG PO TABS: 40.0000 mg | ORAL_TABLET | Freq: Every day | ORAL | 3 refills | Status: DC

## 2023-09-08 ENCOUNTER — Ambulatory Visit: Payer: Medicare (Managed Care) | Attending: Internal Medicine | Admitting: Internal Medicine

## 2023-09-08 ENCOUNTER — Encounter: Payer: Self-pay | Admitting: Internal Medicine

## 2023-09-08 VITALS — BP 136/79 | HR 57 | Temp 97.4°F | Ht 68.0 in | Wt 200.0 lb

## 2023-09-08 DIAGNOSIS — E119 Type 2 diabetes mellitus without complications: Secondary | ICD-10-CM

## 2023-09-08 DIAGNOSIS — E66811 Obesity, class 1: Secondary | ICD-10-CM

## 2023-09-08 DIAGNOSIS — E1159 Type 2 diabetes mellitus with other circulatory complications: Secondary | ICD-10-CM | POA: Diagnosis not present

## 2023-09-08 DIAGNOSIS — J439 Emphysema, unspecified: Secondary | ICD-10-CM

## 2023-09-08 DIAGNOSIS — Z6830 Body mass index (BMI) 30.0-30.9, adult: Secondary | ICD-10-CM

## 2023-09-08 DIAGNOSIS — Z95810 Presence of automatic (implantable) cardiac defibrillator: Secondary | ICD-10-CM

## 2023-09-08 DIAGNOSIS — I152 Hypertension secondary to endocrine disorders: Secondary | ICD-10-CM

## 2023-09-08 DIAGNOSIS — Z2821 Immunization not carried out because of patient refusal: Secondary | ICD-10-CM

## 2023-09-08 DIAGNOSIS — E1169 Type 2 diabetes mellitus with other specified complication: Secondary | ICD-10-CM

## 2023-09-08 DIAGNOSIS — E669 Obesity, unspecified: Secondary | ICD-10-CM | POA: Diagnosis not present

## 2023-09-08 DIAGNOSIS — I5022 Chronic systolic (congestive) heart failure: Secondary | ICD-10-CM | POA: Diagnosis not present

## 2023-09-08 DIAGNOSIS — I2510 Atherosclerotic heart disease of native coronary artery without angina pectoris: Secondary | ICD-10-CM

## 2023-09-08 DIAGNOSIS — Z7984 Long term (current) use of oral hypoglycemic drugs: Secondary | ICD-10-CM

## 2023-09-08 LAB — POCT GLYCOSYLATED HEMOGLOBIN (HGB A1C): HbA1c, POC (controlled diabetic range): 5.9 % (ref 0.0–7.0)

## 2023-09-08 LAB — GLUCOSE, POCT (MANUAL RESULT ENTRY): POC Glucose: 116 mg/dL — AB (ref 70–99)

## 2023-09-08 MED ORDER — EZETIMIBE 10 MG PO TABS: 10.0000 mg | ORAL_TABLET | Freq: Every day | ORAL | 3 refills | Status: DC

## 2023-09-08 MED ORDER — PRAVASTATIN SODIUM 80 MG PO TABS: 80.0000 mg | ORAL_TABLET | Freq: Every evening | ORAL | 3 refills | Status: DC

## 2023-09-08 NOTE — Progress Notes (Signed)
Patient ID: Stephen Jennings, male    DOB: 1955-12-11  MRN: [id number]  CC: Diabetes (DM f/u. Med refill./No questions / concerns/No to shingles vax.)   Subjective: Stephen Jennings is a 68 y.o. male who presents for chronic ds management. His concerns today include:  patient with history of CAD(NSTEMI with stent to LAD 03/2019), ICM with last EF 35-40% (mild to moderate MR and MS 8/24), ICD present,  former smoker, COPD/asthma overlap (Dr. Judeth Horn), DM 2, anemia (resolved on labs 10/21).    DM: Results for orders placed or performed in visit on 09/08/23  POCT glucose (manual entry)   Collection Time: 09/08/23  8:42 AM  Result Value Ref Range   POC Glucose 116 (A) 70 - 99 mg/dl  POCT glycosylated hemoglobin (Hb A1C)   Collection Time: 09/08/23  8:50 AM  Result Value Ref Range   Hemoglobin A1C     HbA1c POC (<> result, manual entry)     HbA1c, POC (prediabetic range)     HbA1c, POC (controlled diabetic range) 5.9 0.0 - 7.0 %  Taking and tolerating Jardiance. On last visit 4 months ago, he was down 18 pounds since January of last year.  Based on weight today he has gained 16 pounds. Admits to over eating sweets during the recent holidays. Checks BS once a wk.  Range 97-120  COPD/asthma overlap: He has not seen his pulmonologist Dr. Judeth Horn since June of last year. Sees him once a yr. He continues on Breztri inhaler twice a day and albuterol as needed.  Still on Claritin, Flonase, Singulair.  No major flares, increase cough, SOB. Had a cold about 3 wks ago; it has ran its course.  CAD/CHF/ICD/HTN. Compliant with Jardiance 10 mg daily, Entresto 97/103 mg twice a day, bisoprolol 5 mg daily, furosemide 40 mg daily, Eplerenone 25 mg daily, Brilinta 90 mg BID, pravastatin 80 mg daily and Zetia.  Has not taken all of his blood pressure medicines as yet for today. -He has still not followed up with the lipid clinic for Leqvio.  Remains on pravastatin and Zetia. Has appt with cardiology next  mth No chest pains, palpitations; no firing of his ICD  Insomnia:  doing good with Trazodone  HM: no to shingles vaccine.  Still not ready to do colon CA screen.  Medicaid was discontinued. Patient Active Problem List   Diagnosis Date Noted   Controlled type 2 diabetes mellitus with hyperglycemia, without long-term current use of insulin (HCC) 08/29/2022   Hyperlipidemia 02/06/2021   Elevated troponin    NSTEMI (non-ST elevated myocardial infarction) (HCC) 02/01/2021   Non-seasonal allergic rhinitis 01/20/2021   Pulmonary emphysema (HCC) 12/03/2020   SOB (shortness of breath) 12/02/2020   History of tobacco abuse 12/02/2020   Chronic systolic heart failure (HCC) 10/05/2020   Coronary artery disease of native artery of native heart with stable angina pectoris (HCC) 10/05/2020   Gastroesophageal reflux disease without esophagitis 10/05/2020   Prediabetes 10/05/2020   Essential hypertension 06/08/2020   Obesity (BMI 30.0-34.9) 06/08/2020   Snoring 11/28/2019   Morning headache 11/28/2019   Chronic daily headache 10/08/2019   Coronary artery disease involving left main coronary artery 05/08/2019   History of placement of stent in LAD coronary artery 05/08/2019   Diabetes mellitus type II, controlled (HCC) 05/08/2019     Current Outpatient Medications on File Prior to Visit  Medication Sig Dispense Refill   Accu-Chek Softclix Lancets lancets Use to measure blood sugar twice a day 100 each 6  albuterol (PROVENTIL) (2.5 MG/3ML) 0.083% nebulizer solution Take 3 mLs (2.5 mg total) by nebulization every 6 (six) hours as needed for wheezing or shortness of breath. 75 mL 12   albuterol (VENTOLIN HFA) 108 (90 Base) MCG/ACT inhaler INHALE 2 PUFFS BY MOUTH EVERY 6 HOURS AS NEEDED FOR WHEEZING FOR SHORTNESS OF BREATH 18 g 0   benzonatate (TESSALON) 200 MG capsule Take 1 capsule (200 mg total) by mouth 3 (three) times daily as needed. For cough 30 capsule 6   bisoprolol (ZEBETA) 5 MG tablet Take  1 tablet by mouth once daily 30 tablet 0   Blood Glucose Monitoring Suppl (ACCU-CHEK GUIDE ME) w/Device KIT 1 kit by Does not apply route in the morning and at bedtime. Use to measure blood sugar twice a day 1 kit 0   Budeson-Glycopyrrol-Formoterol (BREZTRI AEROSPHERE) 160-9-4.8 MCG/ACT AERO Inhale 2 puffs into the lungs 2 (two) times daily. 1 each 11   chlorpheniramine (CHLOR-TRIMETON) 4 MG tablet 1 tab tid prn runny nose 40 tablet 0   co-enzyme Q-10 30 MG capsule Take 1 capsule (30 mg total) by mouth daily. 90 capsule 3   empagliflozin (JARDIANCE) 10 MG TABS tablet Take 1 tablet (10 mg total) by mouth daily before breakfast. 90 tablet 3   eplerenone (INSPRA) 25 MG tablet Take 1 tablet (25 mg total) by mouth daily. 90 tablet 3   fluticasone (FLONASE) 50 MCG/ACT nasal spray USE 1 SPRAY IN EACH NOSTRIL ONCE DAILY AS NEEDED FOR ALLERGIES OR RHINITIS 48 g 0   furosemide (LASIX) 20 MG tablet Take 2 tablets (40 mg total) by mouth daily. 180 tablet 3   glucose blood (ACCU-CHEK GUIDE) test strip Use to measure blood sugar twice a day 100 each 6   Hyprom-Naphaz-Polysorb-Zn Sulf (CLEAR EYES COMPLETE) SOLN Apply 1 drop to eye 3 (three) times daily. 15 mL 1   loratadine (CLARITIN) 10 MG tablet Take 1 tablet by mouth once daily 90 tablet 3   melatonin 1 MG TABS tablet Take 1 tablet (1 mg total) by mouth at bedtime. 30 tablet 1   montelukast (SINGULAIR) 10 MG tablet Take 1 tablet (10 mg total) by mouth at bedtime. 30 tablet 6   nitroGLYCERIN (NITROSTAT) 0.4 MG SL tablet Place 1 tablet (0.4 mg total) under the tongue every 5 (five) minutes x 3 doses as needed for chest pain. 25 tablet 3   omeprazole (PRILOSEC) 20 MG capsule Take 1 capsule (20 mg total) by mouth daily. 90 capsule 1   potassium chloride SA (KLOR-CON M20) 20 MEQ tablet On the first day take (3 tablets) wait 4 hours then take another (3 tablets) by mouth. THEN THURSDAY 8/29 start 2 tablets by mouth 2 times daily. 90 tablet 3    sacubitril-valsartan (ENTRESTO) 97-103 MG Take 1 tablet by mouth 2 (two) times daily. 180 tablet 2   ticagrelor (BRILINTA) 90 MG TABS tablet Take 1 tablet (90 mg total) by mouth 2 (two) times daily. 60 tablet 11   traZODone (DESYREL) 50 MG tablet 1/2 tab PO QHS 45 tablet 1   No current facility-administered medications on file prior to visit.    Allergies  Allergen Reactions   Atorvastatin     Muscle weakness and fatigue on 80mg  daily   Spironolactone     gynecomastia   Metformin And Related Anxiety and Palpitations   Other Anxiety and Palpitations    Social History   Socioeconomic History   Marital status: Married    Spouse name: Not on  file   Number of children: 1   Years of education: 8   Highest education level: 8th grade  Occupational History   Not on file  Tobacco Use   Smoking status: Former    Current packs/day: 0.00    Types: Cigarettes    Quit date: 04/08/2019    Years since quitting: 4.4    Passive exposure: Current (wife smokes)   Smokeless tobacco: Never  Vaping Use   Vaping status: Never Used  Substance and Sexual Activity   Alcohol use: No   Drug use: No   Sexual activity: Not Currently  Other Topics Concern   Not on file  Social History Narrative   Lives with wife   Right handed   Caffeine: about 2 cups of coffee every morning, no soda   Social Drivers of Corporate investment banker Strain: Low Risk  (09/08/2023)   Overall Financial Resource Strain (CARDIA)    Difficulty of Paying Living Expenses: Not very hard  Food Insecurity: No Food Insecurity (09/08/2023)   Hunger Vital Sign    Worried About Running Out of Food in the Last Year: Never true    Ran Out of Food in the Last Year: Never true  Transportation Needs: No Transportation Needs (09/08/2023)   PRAPARE - Administrator, Civil Service (Medical): No    Lack of Transportation (Non-Medical): No  Physical Activity: Inactive (09/08/2023)   Exercise Vital Sign    Days of Exercise  per Week: 0 days    Minutes of Exercise per Session: 0 min  Stress: No Stress Concern Present (09/08/2023)   [employer] of Occupational Health - Occupational Stress Questionnaire    Feeling of Stress : Not at all  Social Connections: Moderately Isolated (09/08/2023)   Social Connection and Isolation Panel [NHANES]    Frequency of Communication with Friends and Family: Once a week    Frequency of Social Gatherings with Friends and Family: More than three times a week    Attends Religious Services: Never    Database administrator or Organizations: No    Attends Banker Meetings: Never    Marital Status: Married  Catering manager Violence: Not At Risk (09/08/2023)   Humiliation, Afraid, Rape, and Kick questionnaire    Fear of Current or Ex-Partner: No    Emotionally Abused: No    Physically Abused: No    Sexually Abused: No    Family History  Problem Relation Age of Onset   Hypertension Mother    Heart failure Mother    Headache Neg Hx    Migraines Neg Hx     Past Surgical History:  Procedure Laterality Date   CARDIAC CATHETERIZATION     CORONARY BALLOON ANGIOPLASTY N/A 02/04/2021   Procedure: CORONARY BALLOON ANGIOPLASTY;  Surgeon: Lennette Bihari, MD;  Location: [facility] INVASIVE CV LAB;  Service: Cardiovascular;  Laterality: N/A;   CORONARY THROMBECTOMY N/A 02/04/2021   Procedure: Coronary Thrombectomy;  Surgeon: Lennette Bihari, MD;  Location[facility] INVASIVE CV LAB;  Service: Cardiovascular;  Laterality: N/A;   CORONARY ULTRASOUND/IVUS N/A 02/04/2021   Procedure: Intravascular Ultrasound/IVUS;  Surgeon: Lennette Bihari, MD;  Location: [facility] INVASIVE CV LAB;  Service: Cardiovascular;  Laterality: N/A;   CORONARY/GRAFT ACUTE MI REVASCULARIZATION N/A 03/31/2019   Procedure: Coronary/Graft Acute MI Revascularization;  Surgeon: Lennette Bihari, MD;  Location: [facility] INVASIVE CV LAB;  Service: Cardiovascular;  Laterality: N/A;   ICD IMPLANT N/A 04/15/2019   Procedure: ICD IMPLANT;  Surgeon: Regan Lemming, MD;  Location: [facility] INVASIVE CV LAB;  Service: Cardiovascular;  Laterality: N/A;   LEFT HEART CATH AND CORONARY ANGIOGRAPHY N/A 03/31/2019   Procedure: LEFT HEART CATH AND CORONARY ANGIOGRAPHY;  Surgeon: Lennette Bihari, MD;  Location: [facility] INVASIVE CV LAB;  Service: Cardiovascular;  Laterality: N/A;   LEFT HEART CATH AND CORONARY ANGIOGRAPHY N/A 02/02/2021   Procedure: LEFT HEART CATH AND CORONARY ANGIOGRAPHY;  Surgeon: Lennette Bihari, MD;  Location: [facility] INVASIVE CV LAB;  Service: Cardiovascular;  Laterality: N/A;   LEFT HEART CATH AND CORONARY ANGIOGRAPHY N/A 02/04/2021   Procedure: LEFT HEART CATH AND CORONARY ANGIOGRAPHY;  Surgeon: Lennette Bihari, MD;  Location: [facility] INVASIVE CV LAB;  Service: Cardiovascular;  Laterality: N/A;   NO PAST SURGERIES     RIGHT HEART CATH N/A 03/31/2019   Procedure: RIGHT HEART CATH;  Surgeon: Lennette Bihari, MD;  Location: [facility] INVASIVE CV LAB;  Service: Cardiovascular;  Laterality: N/A;   VENTRICULAR ASSIST DEVICE INSERTION N/A 03/31/2019   Procedure: VENTRICULAR ASSIST DEVICE INSERTION;  Surgeon: Lennette Bihari, MD;  Location: [facility] INVASIVE CV LAB;  Service: Cardiovascular;  Laterality: N/A;    ROS: Review of Systems Negative except as stated above  PHYSICAL EXAM: BP 136/79 (BP Location: Left Arm, Patient Position: Sitting, Cuff Size: Normal)   Pulse (!) 57   Temp (!) 97.4 F (36.3 C) (Oral)   Ht 5\' 8"  (1.727 m)   Wt 200 lb (90.7 kg)   SpO2 97%   BMI 30.41 kg/m   Wt Readings from Last 3 Encounters:  09/08/23 200 lb (90.7 kg)  07/11/23 186 lb (84.4 kg)  05/05/23 184 lb (83.5 kg)    Physical Exam General appearance - alert, well appearing, and in no distress Mental status - normal mood, behavior, speech, dress, motor activity, and thought processes Neck - supple, no significant adenopathy Chest -breath sounds slightly decreased bilaterally with good air entry. Heart - normal rate, regular rhythm, normal S1, S2, no murmurs, rubs,  clicks or gallops Extremities - peripheral pulses normal, no pedal edema, no clubbing or cyanosis Diabetic Foot Exam - Simple   Simple Foot Form Diabetic Foot exam was performed with the following findings: Yes 09/08/2023  9:12 AM  Visual Inspection See comments: Yes Sensation Testing Intact to touch and monofilament testing bilaterally: Yes Pulse Check Posterior Tibialis and Dorsalis pulse intact bilaterally: Yes Comments Skin on plantar surface of the feet very dry.  Small nonulcerative callus on the ball of the right big toe medially.  Toenails slightly overgrown.        Latest Ref Rng & Units 04/21/2023    8:53 AM 04/19/2023   12:09 PM 04/12/2023   10:11 AM  CMP  Glucose 70 - 99 mg/dL 829  562  130   BUN 8 - 23 mg/dL 16  18  11    Creatinine 0.61 - 1.24 mg/dL 8.65  7.84  6.96   Sodium 135 - 145 mmol/L 143  139  143   Potassium 3.5 - 5.1 mmol/L 4.0  2.5  3.1   Chloride 98 - 111 mmol/L 100  99  104   CO2 22 - 32 mmol/L 27  29  25    Calcium 8.9 - 10.3 mg/dL 9.2  8.8  9.4    Lipid Panel     Component Value Date/Time   CHOL 182 01/02/2023 1150   TRIG 106 01/02/2023 1150   HDL 41 01/02/2023 1150   CHOLHDL 4.4 01/02/2023 1150  CHOLHDL 5.6 08/12/2022 1056   VLDL 14 08/12/2022 1056   LDLCALC 122 (H) 01/02/2023 1150    CBC    Component Value Date/Time   WBC 7.7 01/02/2023 1150   WBC 9.1 07/16/2022 2240   RBC 5.31 01/02/2023 1150   RBC 4.16 (L) 07/16/2022 2240   HGB 15.6 01/02/2023 1150   HCT 48.1 01/02/2023 1150   PLT 288 01/02/2023 1150   MCV 91 01/02/2023 1150   MCH 29.4 01/02/2023 1150   MCH 29.8 07/16/2022 2240   MCHC 32.4 01/02/2023 1150   MCHC 33.2 07/16/2022 2240   RDW 13.2 01/02/2023 1150   LYMPHSABS 0.9 07/16/2022 2240   MONOABS 0.9 07/16/2022 2240   EOSABS 0.1 07/16/2022 2240   BASOSABS 0.0 07/16/2022 2240    ASSESSMENT AND PLAN: 1. Type 2 diabetes mellitus with obesity (HCC) (Primary) At goal.  Continue Jardiance Discussed and encourage healthy  eating habits.  Encouraged him to snack on healthier things like fruits. - POCT glycosylated hemoglobin (Hb A1C) - POCT glucose (manual entry) - Microalbumin / creatinine urine ratio  2. Diabetes mellitus treated with oral medication (HCC)   3. Hypertension associated with type 2 diabetes mellitus (HCC) Not at goal but patient has not taken all medicines as yet for the morning.  He will take his medicines when he returns home.  He is on Entresto twice a day, Eplerenone 25 mg daily, Furosemide 20 mg daily and bisoprolol 5 mg daily   4. Coronary artery disease involving native coronary artery of native heart without angina pectoris Stable.  Continue Brilinta, bisoprolol 5 mg daily, pravastatin and Zetia.  Keep upcoming appointment with cardiology next month. - ezetimibe (ZETIA) 10 MG tablet; Take 1 tablet (10 mg total) by mouth daily.  Dispense: 90 tablet; Refill: 3 - pravastatin (PRAVACHOL) 80 MG tablet; Take 1 tablet (80 mg total) by mouth every evening.  Dispense: 90 tablet; Refill: 3  5. CHF (congestive heart failure), NYHA class II, chronic, systolic (HCC) Stable and compensated.  Continue Entresto 97/103 mg twice a day, furosemide 20 mg daily, Jardiance 10 mg daily, bisoprolol 5 mg daily and a pill Randon 25 mg daily.  6. Pulmonary emphysema, unspecified emphysema type (HCC) Stable on Breztri inhaler  7. ICD (implantable cardioverter-defibrillator) in place   8. Herpes zoster vaccination declined Recommended.  Patient declined.    Patient was given the opportunity to ask questions.  Patient verbalized understanding of the plan and was able to repeat key elements of the plan.   This documentation was completed using Paediatric nurse.  Any transcriptional errors are unintentional.  Orders Placed This Encounter  Procedures   Microalbumin / creatinine urine ratio   POCT glycosylated hemoglobin (Hb A1C)   POCT glucose (manual entry)     Requested  Prescriptions   Signed Prescriptions Disp Refills   ezetimibe (ZETIA) 10 MG tablet 90 tablet 3    Sig: Take 1 tablet (10 mg total) by mouth daily.   pravastatin (PRAVACHOL) 80 MG tablet 90 tablet 3    Sig: Take 1 tablet (80 mg total) by mouth every evening.    Return in about 4 months (around 01/06/2024).  Jonah Blue, MD, FACP

## 2023-09-10 LAB — MICROALBUMIN / CREATININE URINE RATIO
Creatinine, Urine: 133.9 mg/dL
Microalb/Creat Ratio: 16 mg/g{creat} (ref 0–29)
Microalbumin, Urine: 21.4 ug/mL

## 2023-09-12 ENCOUNTER — Ambulatory Visit: Payer: Medicare (Managed Care) | Attending: Cardiology

## 2023-09-12 DIAGNOSIS — Z95810 Presence of automatic (implantable) cardiac defibrillator: Secondary | ICD-10-CM

## 2023-09-12 DIAGNOSIS — I5022 Chronic systolic (congestive) heart failure: Secondary | ICD-10-CM

## 2023-09-13 ENCOUNTER — Other Ambulatory Visit (HOSPITAL_COMMUNITY): Payer: Self-pay | Admitting: Family Medicine

## 2023-09-13 NOTE — Progress Notes (Signed)
EPIC Encounter for ICM Monitoring  Patient Name: Stephen Jennings is a 68 y.o. male Date: 09/13/2023 Primary Care Physican: Marcine Matar, MD Primary Cardiologist: Kelly/Bensimhon Electrophysiologist: [facility] 08/24/2022 Weight: 194 lbs (baseline 194-195) 01/24/2023 Weight: 188-190 lbs 03/20/2023 Weight: 188-190 lbs 07/05/2023 Weight: 189-190 lbs                                                           Transmission results reviewed.    Diet:  11/13 Discussed limiting fluid and salt.   He reports he does drink a lot of water.    CorVue thoracic impedance suggesting fluid levels returned to normal.   Prescribed:  Furosemide 20 mg take 2 tablet(s) (40 mg total) by mouth daily.  Per HF clinic note on 8/21 was instructed to Increase lasix to 40 mg daily x 3 days then back to 20 mg daily (not updated in EPIC).  Potassium 20 mEq take 2 tablets (40 mEq total) by mouth twice a day Eplerenone (inspra) 25 mg take 1 tablet(s) (25 mg total) by mouth daily   Labs: 04/21/2023 Creatinine 0.93, BUN 16, Potassium 4.0, Sodium 143, GFR >60 04/19/2023 Creatinine 1.16, BUN 18, Potassium 2.5, Sodium 139, GFR >60 (low K+ addressed by HF clinic) 04/12/2023 Creatinine 0.89, BUN 11, Potassium 3.1, Sodium 143, GFR >60  09/12/2022 Creatinine 1.09, BUN 15, Potassium 3.9, Sodium 143, GFR 75 08/29/2022 Creatinine 1.01, BUN 14, Potassium 3.2, Sodium 141, GFR >60 08/12/2022 Creatinine 1.17, BUN 19, Potassium 3.4, Sodium 140, GFR >60 A complete set of results can be found in Results Review.   Recommendations:  No changes.   Follow-up plan: ICM clinic phone appointment on 10/02/2023.   91 day device clinic remote transmission 10/18/2023.     EP/Cardiology Office Visits:   Need 3 month appt with Dr Gala Romney following 04/12/2023 HF Clinic visit.  10/02/2023 with Dr Tresa Endo.   10/13/2023 with Dr Elberta Fortis.   Copy of ICM check sent to Dr. Elberta Fortis.   3 month ICM trend: 09/12/2023.    12-14 Month ICM trend:     Karie Soda, RN 09/13/2023 4:13 PM

## 2023-09-15 ENCOUNTER — Other Ambulatory Visit: Payer: Self-pay

## 2023-09-15 ENCOUNTER — Other Ambulatory Visit: Payer: Self-pay | Admitting: Pharmacist

## 2023-09-15 ENCOUNTER — Other Ambulatory Visit (HOSPITAL_COMMUNITY): Payer: Self-pay | Admitting: Internal Medicine

## 2023-09-15 MED ORDER — EMPAGLIFLOZIN 10 MG PO TABS
10.0000 mg | ORAL_TABLET | Freq: Every day | ORAL | 2 refills | Status: DC
  Filled 2023-09-15: qty 30, 30d supply, fill #0

## 2023-09-15 MED ORDER — TRELEGY ELLIPTA 100-62.5-25 MCG/ACT IN AEPB
1.0000 | INHALATION_SPRAY | Freq: Every day | RESPIRATORY_TRACT | 1 refills | Status: DC
  Filled 2023-09-15: qty 60, 30d supply, fill #0

## 2023-09-15 NOTE — Telephone Encounter (Signed)
Requested medication (s) are due for refill today: routing for review  Requested medication (s) are on the active medication list: yes  Last refill:  08/12/22  Future visit scheduled: yes  Notes to clinic:  Unable to refill per protocol, last refill by another provider.      Requested Prescriptions  Pending Prescriptions Disp Refills   empagliflozin (JARDIANCE) 10 MG TABS tablet 90 tablet 3    Sig: Take 1 tablet (10 mg total) by mouth daily before breakfast.     There is no refill protocol information for this order

## 2023-09-25 ENCOUNTER — Telehealth: Payer: Self-pay

## 2023-09-25 ENCOUNTER — Other Ambulatory Visit: Payer: Self-pay

## 2023-09-25 ENCOUNTER — Telehealth: Payer: Self-pay | Admitting: Pulmonary Disease

## 2023-09-25 NOTE — Telephone Encounter (Signed)
Submitted application for BREZTRI AND BRILINTA to [employer] for patient assistance.   Phone: [phone]

## 2023-09-25 NOTE — Telephone Encounter (Signed)
Submitted application for [employer] to [employer]) for patient assistance.   Phone: [phone]  Faxed application today, anticipating a rejection as LIS denial letter and proof of income was not submitted. Waiting on patient to provide, patient is aware that both are needed.

## 2023-09-25 NOTE — Telephone Encounter (Signed)
Can we request a review of the denial of Breztri by [place]?  He has COPD and improved significantly with triple inhaled therapy.  He has failed multiple ICS/LABA inhalers in the past due to worsening symptoms etc.  Phone number listed on paper is [phone] - thank you

## 2023-09-25 NOTE — Telephone Encounter (Signed)
Received notification from [employer] regarding approval for BREZTRI AND BRILINTA. Patient assistance approved from 09/25/2023 to 08/21/2024.  Medication will ship to [address], [address]  Pt ID: [id number]  Company phone: [phone]

## 2023-09-26 NOTE — Telephone Encounter (Signed)
Looks like patient was approved for patient assistance and medication is to be shipped

## 2023-09-28 ENCOUNTER — Other Ambulatory Visit: Payer: Self-pay

## 2023-09-29 ENCOUNTER — Other Ambulatory Visit: Payer: Self-pay

## 2023-10-02 ENCOUNTER — Telehealth: Payer: Self-pay

## 2023-10-02 ENCOUNTER — Ambulatory Visit: Payer: Medicare (Managed Care) | Admitting: Cardiovascular Disease

## 2023-10-02 ENCOUNTER — Other Ambulatory Visit: Payer: Self-pay

## 2023-10-02 ENCOUNTER — Ambulatory Visit: Payer: Medicare (Managed Care) | Attending: Cardiology

## 2023-10-02 DIAGNOSIS — Z95810 Presence of automatic (implantable) cardiac defibrillator: Secondary | ICD-10-CM | POA: Diagnosis not present

## 2023-10-02 DIAGNOSIS — I5022 Chronic systolic (congestive) heart failure: Secondary | ICD-10-CM

## 2023-10-02 NOTE — Telephone Encounter (Signed)
 Received notification from [facility] [employer]) regarding approval for JARDIANCE . Patient assistance approved from 10/01/2023 to 08/21/2024.  Medication will ship to [address], [address]  Pt ID: [id number]  Company phone: [phone]

## 2023-10-04 ENCOUNTER — Telehealth: Payer: Self-pay

## 2023-10-04 NOTE — Progress Notes (Signed)
EPIC Encounter for ICM Monitoring  Patient Name: Stephen Jennings is a 68 y.o. male Date: 10/04/2023 Primary Care Physican: Marcine Matar, MD Primary Cardiologist: Kelly/Bensimhon Electrophysiologist: [facility] 08/24/2022 Weight: 194 lbs (baseline 194-195) 01/24/2023 Weight: 188-190 lbs 03/20/2023 Weight: 188-190 lbs 07/05/2023 Weight: 189-190 lbs        09/08/2023 Office Weight: 200 lbs                                                    Attempted call to patient and unable to reach.   Transmission results reviewed.    Diet:  N/A   CorVue thoracic impedance suggesting normal fluid levels with the exception of possible fluid accumulation from 1/4-1/13 and 1/28-2/1.   Prescribed:  Furosemide 20 mg take 2 tablet(s) (40 mg total) by mouth daily.  Per HF clinic note on 8/21 was instructed to Increase lasix to 40 mg daily x 3 days then back to 20 mg daily (not updated in EPIC).  Potassium 20 mEq take 2 tablets (40 mEq total) by mouth twice a day Eplerenone (inspra) 25 mg take 1 tablet(s) (25 mg total) by mouth daily   Labs: 04/21/2023 Creatinine 0.93, BUN 16, Potassium 4.0, Sodium 143, GFR >60 04/19/2023 Creatinine 1.16, BUN 18, Potassium 2.5, Sodium 139, GFR >60 (low K+ addressed by HF clinic) 04/12/2023 Creatinine 0.89, BUN 11, Potassium 3.1, Sodium 143, GFR >60  09/12/2022 Creatinine 1.09, BUN 15, Potassium 3.9, Sodium 143, GFR 75 08/29/2022 Creatinine 1.01, BUN 14, Potassium 3.2, Sodium 141, GFR >60 08/12/2022 Creatinine 1.17, BUN 19, Potassium 3.4, Sodium 140, GFR >60 A complete set of results can be found in Results Review.   Recommendations:  Unable to reach.     Follow-up plan: ICM clinic phone appointment on 11/13/2023.   91 day device clinic remote transmission 10/18/2023.     EP/Cardiology Office Visits:   Need 3 month appt with Dr Gala Romney following 04/12/2023 HF Clinic visit.  10/02/2023 with Dr Tresa Endo.   10/13/2023 with Otilio Saber, PA.   Copy of ICM check sent to Dr. Elberta Fortis.    3 month ICM trend: 10/02/2023.    12-14 Month ICM trend:     Karie Soda, RN 10/04/2023 3:43 PM

## 2023-10-04 NOTE — Telephone Encounter (Signed)
Remote ICM transmission received.  Attempted call to patient regarding ICM remote transmission and left detailed message per DPR.  Left ICM phone number and advised to return call for any fluid symptoms or questions. Next ICM remote transmission scheduled 11/13/2023.

## 2023-10-06 ENCOUNTER — Telehealth: Payer: Self-pay

## 2023-10-06 ENCOUNTER — Other Ambulatory Visit: Payer: Self-pay

## 2023-10-06 NOTE — Telephone Encounter (Signed)
Submitted application for ENTRESTO 97/103MG  to [employer] for patient assistance.   Phone: [phone]  APPLICATION WAS FAXED TO [employer] TODAY WITHOUT PROOF OF INCOME DOCUMENTS AND LIS DENIAL LETTER. PATIENT WAS INFORMED ON 09/15/2023 THAT THESE DOCUMENTS WERE NEEDED FOR PROGRAM APPROVAL ON 09/15/2023.

## 2023-10-12 NOTE — Progress Notes (Unsigned)
  Electrophysiology Office Note:   ID:  [name], DOB 02/29/1956, MRN [id number]  Primary Cardiologist: Nicki Guadalajara, MD Electrophysiologist: Will Jorja Loa, MD  {Click to update primary MD,subspecialty MD or APP then REFRESH:1}    History of Present Illness:   Stephen Jennings is a 68 y.o. male with h/o ICM, CAD, TDP s/p STEMI, tobacco abuse, and DM2 seen today for routine electrophysiology followup.   Since last being seen in our clinic the patient reports doing ***.  he denies chest pain, palpitations, dyspnea, PND, orthopnea, nausea, vomiting, dizziness, syncope, edema, weight gain, or early satiety.   Review of systems complete and found to be negative unless listed in HPI.   EP Information / Studies Reviewed:    EKG is ordered today. Personal review as below.       ICD Interrogation-  reviewed in detail today,  See PACEART report.  Arrhythmia/Device History SJM dual chamber ICD, implanted 04/15/2019, secondary prevention    Physical Exam:   VS:  There were no vitals taken for this visit.   Wt Readings from Last 3 Encounters:  09/08/23 200 lb (90.7 kg)  07/11/23 186 lb (84.4 kg)  05/05/23 184 lb (83.5 kg)     GEN: No acute distress *** NECK: No JVD; No carotid bruits CARDIAC: {EPRHYTHM:28826}, no murmurs, rubs, gallops RESPIRATORY:  Clear to auscultation without rales, wheezing or rhonchi  ABDOMEN: Soft, non-tender, non-distended EXTREMITIES:  {EDEMA LEVEL:28147::"No"} edema; No deformity   ASSESSMENT AND PLAN:    Chronic systolic CHF  s/p Abbott dual chamber ICD  euvolemic today Stable on an appropriate medical regimen Normal ICD function See Pace Art report No changes today  H/o VT/Torsades Labs today  CAD Denies s/s ischemia  HTN Stable on current regimen      Disposition:   Follow up with {EPPROVIDERS:28135} {EPFOLLOW UP:28173}   Signed, Graciella Freer, [name]

## 2023-10-13 ENCOUNTER — Other Ambulatory Visit: Payer: Self-pay

## 2023-10-13 ENCOUNTER — Ambulatory Visit: Payer: Medicare Other | Attending: Student | Admitting: Student

## 2023-10-13 ENCOUNTER — Encounter: Payer: Self-pay | Admitting: Student

## 2023-10-13 ENCOUNTER — Telehealth: Payer: Self-pay

## 2023-10-13 ENCOUNTER — Ambulatory Visit: Payer: Medicare (Managed Care) | Admitting: Cardiology

## 2023-10-13 VITALS — BP 134/90 | HR 58 | Ht 68.0 in | Wt 204.8 lb

## 2023-10-13 DIAGNOSIS — Z95810 Presence of automatic (implantable) cardiac defibrillator: Secondary | ICD-10-CM

## 2023-10-13 DIAGNOSIS — I5022 Chronic systolic (congestive) heart failure: Secondary | ICD-10-CM

## 2023-10-13 DIAGNOSIS — I10 Essential (primary) hypertension: Secondary | ICD-10-CM | POA: Diagnosis not present

## 2023-10-13 LAB — CUP PACEART INCLINIC DEVICE CHECK
Battery Remaining Longevity: 64 mo
Brady Statistic RA Percent Paced: 11 %
Brady Statistic RV Percent Paced: 0.03 %
Date Time Interrogation Session: 20250221121646
HighPow Impedance: 76.5 Ohm
Implantable Lead Connection Status: 753985
Implantable Lead Connection Status: 753985
Implantable Lead Implant Date: 20200824
Implantable Lead Implant Date: 20200824
Implantable Lead Location: 753859
Implantable Lead Location: 753860
Implantable Pulse Generator Implant Date: 20200824
Lead Channel Impedance Value: 350 Ohm
Lead Channel Impedance Value: 387.5 Ohm
Lead Channel Pacing Threshold Amplitude: 0.75 V
Lead Channel Pacing Threshold Amplitude: 0.75 V
Lead Channel Pacing Threshold Amplitude: 1 V
Lead Channel Pacing Threshold Amplitude: 1 V
Lead Channel Pacing Threshold Pulse Width: 0.5 ms
Lead Channel Pacing Threshold Pulse Width: 0.5 ms
Lead Channel Pacing Threshold Pulse Width: 0.5 ms
Lead Channel Pacing Threshold Pulse Width: 0.5 ms
Lead Channel Sensing Intrinsic Amplitude: 1.7 mV
Lead Channel Sensing Intrinsic Amplitude: 11.9 mV
Lead Channel Setting Pacing Amplitude: 2 V
Lead Channel Setting Pacing Amplitude: 2.5 V
Lead Channel Setting Pacing Pulse Width: 0.5 ms
Lead Channel Setting Sensing Sensitivity: 0.5 mV
Pulse Gen Serial Number: 1300954

## 2023-10-13 NOTE — Telephone Encounter (Signed)
Received notification from [employer] regarding DENIAL for  ENTRESTO 97/103MG   Pt ID: [id number]  Company phone: [phone]  DENIAL LETTER UPLOADED TO MEDIA, PATIENT IS AWARE OF REQUIRED DOCUMENTS NEEDED FOR APPROVAL

## 2023-10-13 NOTE — Patient Instructions (Signed)
Medication Instructions:  Your physician recommends that you continue on your current medications as directed. Please refer to the Current Medication list given to you today.  *If you need a refill on your cardiac medications before your next appointment, please call your pharmacy*  Lab Work: BMET-TODAY If you have labs (blood work) drawn today and your tests are completely normal, you will receive your results only by: MyChart Message (if you have MyChart) OR A paper copy in the mail If you have any lab test that is abnormal or we need to change your treatment, we will call you to review the results.   Follow-Up: At [facility], you and your health needs are our priority.  As part of our continuing mission to provide you with exceptional heart care, we have created designated Provider Care Teams.  These Care Teams include your primary Cardiologist (physician) and Advanced Practice Providers (APPs -  Physician Assistants and Nurse Practitioners) who all work together to provide you with the care you need, when you need it.  We recommend signing up for the patient portal called "MyChart".  Sign up information is provided on this After Visit Summary.  MyChart is used to connect with patients for Virtual Visits (Telemedicine).  Patients are able to view lab/test results, encounter notes, upcoming appointments, etc.  Non-urgent messages can be sent to your provider as well.   To learn more about what you can do with MyChart, go to [email or web address].    Your next appointment:   1 year(s)  Provider:   Casimiro Needle "Otilio Saber, [name]

## 2023-10-14 LAB — BASIC METABOLIC PANEL
BUN/Creatinine Ratio: 17 (ref 10–24)
BUN: 21 mg/dL (ref 8–27)
CO2: 26 mmol/L (ref 20–29)
Calcium: 9.8 mg/dL (ref 8.6–10.2)
Chloride: 101 mmol/L (ref 96–106)
Creatinine, Ser: 1.22 mg/dL (ref 0.76–1.27)
Glucose: 103 mg/dL — ABNORMAL HIGH (ref 70–99)
Potassium: 4.7 mmol/L (ref 3.5–5.2)
Sodium: 142 mmol/L (ref 134–144)
eGFR: 65 mL/min/{1.73_m2} (ref >59)

## 2023-10-16 ENCOUNTER — Other Ambulatory Visit: Payer: Self-pay

## 2023-10-18 ENCOUNTER — Ambulatory Visit (INDEPENDENT_AMBULATORY_CARE_PROVIDER_SITE_OTHER): Payer: Medicare HMO

## 2023-10-18 DIAGNOSIS — I255 Ischemic cardiomyopathy: Secondary | ICD-10-CM | POA: Diagnosis not present

## 2023-10-19 LAB — CUP PACEART REMOTE DEVICE CHECK
Battery Remaining Longevity: 59 mo
Battery Remaining Percentage: 60 %
Battery Voltage: 2.96 V
Brady Statistic AP VP Percent: 1 %
Brady Statistic AP VS Percent: 19 %
Brady Statistic AS VP Percent: 1 %
Brady Statistic AS VS Percent: 80 %
Brady Statistic RA Percent Paced: 18 %
Brady Statistic RV Percent Paced: 1 %
Date Time Interrogation Session: 20250226053400
HighPow Impedance: 77 Ohm
HighPow Impedance: 77 Ohm
Implantable Lead Connection Status: 753985
Implantable Lead Connection Status: 753985
Implantable Lead Implant Date: 20200824
Implantable Lead Implant Date: 20200824
Implantable Lead Location: 753859
Implantable Lead Location: 753860
Implantable Pulse Generator Implant Date: 20200824
Lead Channel Impedance Value: 340 Ohm
Lead Channel Impedance Value: 360 Ohm
Lead Channel Pacing Threshold Amplitude: 0.75 V
Lead Channel Pacing Threshold Amplitude: 1 V
Lead Channel Pacing Threshold Pulse Width: 0.5 ms
Lead Channel Pacing Threshold Pulse Width: 0.5 ms
Lead Channel Sensing Intrinsic Amplitude: 1.1 mV
Lead Channel Sensing Intrinsic Amplitude: 10.9 mV
Lead Channel Setting Pacing Amplitude: 2 V
Lead Channel Setting Pacing Amplitude: 2.5 V
Lead Channel Setting Pacing Pulse Width: 0.5 ms
Lead Channel Setting Sensing Sensitivity: 0.5 mV
Pulse Gen Serial Number: 1300954

## 2023-10-20 ENCOUNTER — Other Ambulatory Visit: Payer: Self-pay | Admitting: Internal Medicine

## 2023-10-20 ENCOUNTER — Other Ambulatory Visit: Payer: Self-pay

## 2023-10-20 DIAGNOSIS — Z6379 Other stressful life events affecting family and household: Secondary | ICD-10-CM

## 2023-10-20 DIAGNOSIS — G4700 Insomnia, unspecified: Secondary | ICD-10-CM

## 2023-10-30 ENCOUNTER — Telehealth: Payer: Self-pay | Admitting: Internal Medicine

## 2023-10-30 NOTE — Telephone Encounter (Signed)
 Copied from CRM [id number]. Topic: General - Other >> Oct 30, 2023  1:29 PM Abundio Miu S wrote: Reason for CRM: Myriam Jacobson, [facility]calling to verify patient diagnoses.  Callback# [phone]

## 2023-11-02 ENCOUNTER — Other Ambulatory Visit: Payer: Self-pay

## 2023-11-13 ENCOUNTER — Ambulatory Visit: Payer: Medicare (Managed Care) | Attending: Cardiology

## 2023-11-13 ENCOUNTER — Other Ambulatory Visit: Payer: Self-pay

## 2023-11-13 DIAGNOSIS — Z95810 Presence of automatic (implantable) cardiac defibrillator: Secondary | ICD-10-CM

## 2023-11-13 DIAGNOSIS — I5022 Chronic systolic (congestive) heart failure: Secondary | ICD-10-CM | POA: Diagnosis not present

## 2023-11-15 ENCOUNTER — Telehealth: Payer: Self-pay

## 2023-11-15 NOTE — Progress Notes (Signed)
 EPIC Encounter for ICM Monitoring  Patient Name: Stephen Jennings is a 68 y.o. male Date: 11/15/2023 Primary Care Physican: Marcine Matar, MD Primary Cardiologist: Kelly/Bensimhon Electrophysiologist: [facility] 08/24/2022 Weight: 194 lbs (baseline 194-195) 01/24/2023 Weight: 188-190 lbs 03/20/2023 Weight: 188-190 lbs 07/05/2023 Weight: 189-190 lbs        09/08/2023 Office Weight: 200 lbs                                                    Attempted call to patient and unable to reach.   Transmission results reviewed.    Diet:  N/A   CorVue thoracic impedance suggesting normal fluid levels with the exception of possible fluid accumulation from 3/6-3/15.   Prescribed:  Furosemide 20 mg take 2 tablet(s) (40 mg total) by mouth daily.  Per HF clinic note on 8/21 was instructed to Increase lasix to 40 mg daily x 3 days then back to 20 mg daily (not updated in EPIC).  Potassium 20 mEq take 2 tablets (40 mEq total) by mouth twice a day   Labs: 10/13/2023 Creatinine 1.22, BUN 21, Potassium 4.7, Sodium 142, GFR 65 04/21/2023 Creatinine 0.93, BUN 16, Potassium 4.0, Sodium 143, GFR >60 04/19/2023 Creatinine 1.16, BUN 18, Potassium 2.5, Sodium 139, GFR >60 (low K+ addressed by HF clinic) 04/12/2023 Creatinine 0.89, BUN 11, Potassium 3.1, Sodium 143, GFR >60  09/12/2022 Creatinine 1.09, BUN 15, Potassium 3.9, Sodium 143, GFR 75 08/29/2022 Creatinine 1.01, BUN 14, Potassium 3.2, Sodium 141, GFR >60 08/12/2022 Creatinine 1.17, BUN 19, Potassium 3.4, Sodium 140, GFR >60 A complete set of results can be found in Results Review.   Recommendations:  Unable to reach.       Follow-up plan: ICM clinic phone appointment on 12/18/2023.   91 day device clinic remote transmission 01/17/2024.     EP/Cardiology Office Visits:   Last visit with Dr Gala Romney was 04/12/2023 and needs appt.  12/26/2023 with Dr Tresa Endo.   Recall 10/07/2024 with Otilio Saber, PA.   Copy of ICM check sent to Dr. Elberta Fortis.   3 month ICM trend:  11/13/2023.    12-14 Month ICM trend:     Karie Soda, RN 11/15/2023 7:47 AM

## 2023-11-15 NOTE — Telephone Encounter (Signed)
 Remote ICM transmission received.  Attempted call to patient regarding ICM remote transmission and no answer.

## 2023-11-17 ENCOUNTER — Other Ambulatory Visit: Payer: Self-pay

## 2023-11-21 NOTE — Addendum Note (Signed)
 Addended by: Elease Etienne A on: 11/21/2023 12:27 PM   Modules accepted: Orders

## 2023-11-21 NOTE — Progress Notes (Signed)
 Remote ICD transmission.

## 2023-12-06 ENCOUNTER — Telehealth: Payer: Self-pay

## 2023-12-06 ENCOUNTER — Other Ambulatory Visit: Payer: Self-pay

## 2023-12-06 NOTE — Telephone Encounter (Signed)
 Received notification from [employer] regarding approval for ENTRESTO 97/103. Patient assistance approved from 12/05/2023 to 08/21/2024.  Medication will ship to [address], [place], [place] [address]  Pt ID: [id number]  Company phone: [phone]

## 2023-12-18 ENCOUNTER — Ambulatory Visit: Attending: Cardiology

## 2023-12-18 DIAGNOSIS — Z95810 Presence of automatic (implantable) cardiac defibrillator: Secondary | ICD-10-CM | POA: Diagnosis not present

## 2023-12-18 DIAGNOSIS — I5022 Chronic systolic (congestive) heart failure: Secondary | ICD-10-CM

## 2023-12-20 ENCOUNTER — Telehealth: Payer: Self-pay

## 2023-12-20 NOTE — Progress Notes (Signed)
 EPIC Encounter for ICM Monitoring  Patient Name: Stephen Jennings is a 68 y.o. male Date: 12/20/2023 Primary Care Physican: Lawrance Presume, MD Primary Cardiologist: Kelly/Bensimhon Electrophysiologist: [place] 08/24/2022 Weight: 194 lbs (baseline 194-195) 01/24/2023 Weight: 188-190 lbs 03/20/2023 Weight: 188-190 lbs 07/05/2023 Weight: 189-190 lbs        09/08/2023 Office Weight: 200 lbs                                                    Attempted call to patient and unable to reach.   Transmission results reviewed.    Diet:  N/A   CorVue thoracic impedance suggesting possible dryness from 4/17-4/27.  Suggesting possible fluid accumulation from 4/7-4/17.   Prescribed:  Furosemide  20 mg take 2 tablet(s) (40 mg total) by mouth daily.  Per HF clinic note on 8/21 was instructed to Increase lasix  to 40 mg daily x 3 days then back to 20 mg daily (not updated in EPIC).  Potassium 20 mEq take 2 tablets (40 mEq total) by mouth twice a day   Labs: 10/13/2023 Creatinine 1.22, BUN 21, Potassium 4.7, Sodium 142, GFR 65 04/21/2023 Creatinine 0.93, BUN 16, Potassium 4.0, Sodium 143, GFR >60 04/19/2023 Creatinine 1.16, BUN 18, Potassium 2.5, Sodium 139, GFR >60 (low K+ addressed by HF clinic) 04/12/2023 Creatinine 0.89, BUN 11, Potassium 3.1, Sodium 143, GFR >60  09/12/2022 Creatinine 1.09, BUN 15, Potassium 3.9, Sodium 143, GFR 75 08/29/2022 Creatinine 1.01, BUN 14, Potassium 3.2, Sodium 141, GFR >60 08/12/2022 Creatinine 1.17, BUN 19, Potassium 3.4, Sodium 140, GFR >60 A complete set of results can be found in Results Review.   Recommendations:  Unable to reach.       Follow-up plan: ICM clinic phone appointment on 01/22/2024.   91 day device clinic remote transmission 01/17/2024.     EP/Cardiology Office Visits:   Last visit with Dr Julane Ny was 04/12/2023 and needs appt.  12/26/2023 with Dr Loetta Ringer.   Recall 10/07/2024 with Michaelle Adolphus, PA.   Copy of ICM check sent to Dr. Lawana Pray.   3 month ICM trend:  12/18/2023.    12-14 Month ICM trend:     Almyra Jain, RN 12/20/2023 3:02 PM

## 2023-12-20 NOTE — Telephone Encounter (Signed)
 Remote ICM transmission received.  Attempted call to patient regarding ICM remote transmission and no answer.

## 2023-12-26 ENCOUNTER — Encounter: Payer: Self-pay | Admitting: Cardiovascular Disease

## 2023-12-26 ENCOUNTER — Ambulatory Visit: Payer: Medicare (Managed Care) | Attending: Cardiovascular Disease | Admitting: Cardiovascular Disease

## 2023-12-26 DIAGNOSIS — I10 Essential (primary) hypertension: Secondary | ICD-10-CM

## 2023-12-26 DIAGNOSIS — E785 Hyperlipidemia, unspecified: Secondary | ICD-10-CM | POA: Diagnosis not present

## 2023-12-26 DIAGNOSIS — M791 Myalgia, unspecified site: Secondary | ICD-10-CM | POA: Diagnosis not present

## 2023-12-26 DIAGNOSIS — T466X5A Adverse effect of antihyperlipidemic and antiarteriosclerotic drugs, initial encounter: Secondary | ICD-10-CM

## 2023-12-26 DIAGNOSIS — I5022 Chronic systolic (congestive) heart failure: Secondary | ICD-10-CM | POA: Diagnosis not present

## 2023-12-26 DIAGNOSIS — Z95810 Presence of automatic (implantable) cardiac defibrillator: Secondary | ICD-10-CM

## 2023-12-26 DIAGNOSIS — T466X5D Adverse effect of antihyperlipidemic and antiarteriosclerotic drugs, subsequent encounter: Secondary | ICD-10-CM | POA: Diagnosis not present

## 2023-12-26 DIAGNOSIS — I2102 ST elevation (STEMI) myocardial infarction involving left anterior descending coronary artery: Secondary | ICD-10-CM | POA: Diagnosis not present

## 2023-12-26 DIAGNOSIS — I429 Cardiomyopathy, unspecified: Secondary | ICD-10-CM

## 2023-12-26 DIAGNOSIS — I2510 Atherosclerotic heart disease of native coronary artery without angina pectoris: Secondary | ICD-10-CM | POA: Diagnosis not present

## 2023-12-26 DIAGNOSIS — I255 Ischemic cardiomyopathy: Secondary | ICD-10-CM | POA: Diagnosis not present

## 2023-12-26 NOTE — Patient Instructions (Signed)
 Medication Instructions:  NO CHANGES *If you need a refill on your cardiac medications before your next appointment, please call your pharmacy*  Lab Work: CMP,CBC,TSH,BNP,FASTING LIPID PANEL, Lpa TODAY If you have labs (blood work) drawn today and your tests are completely normal, you will receive your results only by: MyChart Message (if you have MyChart) OR A paper copy in the mail If you have any lab test that is abnormal or we need to change your treatment, we will call you to review the results.  Testing/Procedures:[address] Your physician has requested that you have an echocardiogram. Echocardiography is a painless test that uses sound waves to create images of your heart. It provides your doctor with information about the size and shape of your heart and how well your heart's chambers and valves are working. This procedure takes approximately one hour. There are no restrictions for this procedure. Please do NOT wear cologne, perfume, aftershave, or lotions (deodorant is allowed). Please arrive 15 minutes prior to your appointment time.  Please note: We ask at that you not bring children with you during ultrasound (echo/ vascular) testing. Due to room size and safety concerns, children are not allowed in the ultrasound rooms during exams. Our front office staff cannot provide observation of children in our lobby area while testing is being conducted. An adult accompanying a patient to their appointment will only be allowed in the ultrasound room at the discretion of the ultrasound technician under special circumstances. We apologize for any inconvenience.   Follow-Up: At [facility], you and your health needs are our priority.  As part of our continuing mission to provide you with exceptional heart care, our providers are all part of one team.  This team includes your primary Cardiologist (physician) and Advanced Practice Providers or APPs (Physician Assistants and Nurse  Practitioners) who all work together to provide you with the care you need, when you need it.  Your next appointment:   6 month(s)  Provider:   Rosan Comfort, MD

## 2023-12-26 NOTE — Progress Notes (Signed)
 Cardiology Office Note    Date:  12/31/2023   ID:  Stephen Jennings, DOB January 24, 1956, MRN [id number]  PCP:  Lawrance Presume, MD  Cardiologist:  Magnus Schuller, MD   16 month follow-up cardiology evaluation  History of Present Illness:  Stephen Jennings is a 68 y.o. male who has a history of PAD and suffered an acute coronary syndrome in August 2020 with acute respiratory failure and cardiogenic shock in the setting of an anterior ST segment elevation myocardial infarction.  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  The RCA had 70% proximal stenosis.  The LAD was occluded proximally with TIMI 0 flow and he underwent successful intervention to the LAD PTCA and stenting of the proximal LAD, PTCA of the ostium of the diagonal vessel, and PTCA of the mid LAD with insertion of a 2.75 x 26 mm Resolute stent to the proximal LAD before the diagonal bifurcation.  Due to cardiogenic shock an Impella catheter was inserted for hemodynamic support.  Subsequently, he developed torsade and underwent ICD implantation.  An echo Doppler study in April 2022 showed normalization of LV function with EF at 55 to 60%.  Patient had done well and apparently stopped taking Brilinta  in April 2022, 6 weeks prior to his most recent ACS which occurred on February 02, 2021.  Catheterization showed EF estimated 25 to 30% with significant thrombus burden in the LAD immediately after the stent extending into the diagonal vessel.  He was treated with Aggrastat  and bolused with Brilinta  and 2 days later return for repeat catheterization by me on February 04, 2021.  Relook angiography of the left coronary system showed improvement in the previous thrombus burden in the diagonal and LAD distal stent and beyond but moderate residual thrombus was still present for which he underwent successful percutaneous coronary intervention utilizing Pronto  thrombectomy, intravascular ultrasound, and ultimate PTCA of the distal stented segment and mid LAD.  Was recommended that he continue DAPT indefinitely.  Subsequently, the patient has been followed by Dr. Julane Ny at [facility].  He was last seen by Dr. Julane Ny on March 23, 2021 at which time he admitted that over the past week he was experiencing more shortness of breath with activity or lying down.  Prior to that evaluation he had been seen in the Pharm.D. clinic and Lasix  was stopped due to mild HKA and his beta-blocker increased and losartan  was changed to Entresto  24/26 twice daily.  When seen by Dr. Julane Ny recently, it was recommended that Entresto  be increased to 49/51 mg twice daily and that he take Lasix  40 mg for 2 days then 40 mg on Monday Wednesday and Friday.  He also has been on Jardiance  10 mg daily and had been on metoprolol  succinate with plans to possibly switch to bisoprolol  if wheezing develops.  His ICD was interrogated and there was no VT or AF noted.  I saw him for my initial cardiology office evaluation in March 24, 2021 following his June 2022 hospitalization.  He has not had any recurrent chest pain.  Breathing has improved with the recent resumption of Lasix .  He continues to be on aspirin  and Brilinta  90 mg twice daily for DAPT.  At that time, he was only on rosuvastatin  at 20 mg with Zetia  and with his LDL cholesterol at 150 with total cholesterol 221 in June 2022 I suggested he increase rosuvastatin  to 40 mg and  continue Zetia  10 mg with target LDL less than 55 if at all possible.  Since I saw him he has felt well.  He denies any recurrent chest pain.  He was reevaluated in his heart clinic failure clinic by Vernia Good, [name] on May 25, 2021 and saw Dr. Julane Ny on July 27, 2021.  He was felt to have stable NYHA II symptomatology.  I saw him on August 04, 2021 at which time he was on Entresto  97/103 mg twice a day, Lasix  40 mg daily, recently  increased from Monday Wednesday and Friday, bisoprolol  5 mg, and Jardiance  10 mg daily.  He was no longer on spironolactone  due to hyperkalemia.  He has not had any VT or AF on recent ICD interrogation.  He denied any recurrent anginal symptomatology.  He underwent a follow-up echo Doppler study on July 27, 2021 which showed EF at 35 to 40% with moderate dilation of his left ventricle and grade 2 diastolic dysfunction.  There was mild biatrial enlargement, mild MR.    When I saw him on March 16, 2022 he had apparently run out of Entresto  for approximately 5 to 6 weeks. There is some question that he may have been back on losartan  and also ran out of this as well.  He continues to be on bisoprolol  5 mg, Jardiance  10 mg, eplerenone  12.5 mg, Zetia  10 mg, and is on day history and as needed albuterol  for his lungs.  He continues to be on aspirin /Brilinta  90 mg twice a day and is on pravastatin  80 mg for hyperlipidemia.  He denies any chest pain.  He quit smoking in August 2020 after smoking for 40 years.  During that evaluation, I provided him with samples of Entresto  49/51 mg twice a day.  He continued to be on pravastatin  80 mg and LDL cholesterol was 36 in December 2022.  I recommended follow-up laboratory be checked including an LP(a).  He was evaluated by Ronita Cohens in follow-up of reinstitution of Entresto , Advanced Heart Failure clinic by Vernia Good, [name] on August 12, 2022 and most recently in EP clinic with defibrillator check by Pilar Bridge, PA yesterday.  I last saw him on September 13, 2022 at which time he felt well.  He denied any awareness of defibrillator discharge.  He was seen in the emergency room in November 2023 for acute CHF which time his Lasix  was increased.  When seen at subsequent heart failure evaluation, he was felt to be euvolemic and denied palpitations, chest pain, dizziness edema, PND orthopnea.  He was continuing to see advanced heart failure clinic.  He was  evaluated by Pilar Bridge, [name] for EP on October 13, 2023 and was felt to be stable on appropriate medical therapy.  He had normal ICD function.  Presently, he feels well.  He exercises with walking and doing yard work and denies chest pain or shortness of breath.  He is on bisoprolol  5 mg daily, Entresto  97/103 twice a day, Eplerenone  25 mg daily, furosemide  20 mg, Jardiance  10 mg and also is on Zetia  10 mg, pravastatin  80 mg for hyperlipidemia.  He takes [employer], montelukast  and as needed albuterol .  He has continued to be on ticagrelor  90 mg twice a day for antiplatelet therapy.  He presents for evaluation.   Past Medical History:  Diagnosis Date   Cardiogenic shock (HCC)    CHF (congestive heart failure) (HCC)    Coronary artery disease    Diabetes mellitus without complication (HCC)  Hyperlipidemia    Medical history non-contributory     Past Surgical History:  Procedure Laterality Date   CARDIAC CATHETERIZATION     CORONARY BALLOON ANGIOPLASTY N/A 02/04/2021   Procedure: CORONARY BALLOON ANGIOPLASTY;  Surgeon: Millicent Ally, MD;  Location: [facility] INVASIVE CV LAB;  Service: Cardiovascular;  Laterality: N/A;   CORONARY THROMBECTOMY N/A 02/04/2021   Procedure: Coronary Thrombectomy;  Surgeon: Millicent Ally, MD;  Location: [facility] INVASIVE CV LAB;  Service: Cardiovascular;  Laterality: N/A;   CORONARY ULTRASOUND/IVUS N/A 02/04/2021   Procedure: Intravascular Ultrasound/IVUS;  Surgeon: Millicent Ally, MD;  Location: [facility] INVASIVE CV LAB;  Service: Cardiovascular;  Laterality: N/A;   CORONARY/GRAFT ACUTE MI REVASCULARIZATION N/A 03/31/2019   Procedure: Coronary/Graft Acute MI Revascularization;  Surgeon: Millicent Ally, MD;  Location: [facility] INVASIVE CV LAB;  Service: Cardiovascular;  Laterality: N/A;   ICD IMPLANT N/A 04/15/2019   Procedure: ICD IMPLANT;  Surgeon: Lei Pump, MD;  Location: [facility] INVASIVE CV LAB;  Service: Cardiovascular;  Laterality: N/A;   LEFT HEART CATH AND  CORONARY ANGIOGRAPHY N/A 03/31/2019   Procedure: LEFT HEART CATH AND CORONARY ANGIOGRAPHY;  Surgeon: Millicent Ally, MD;  Location: [facility] INVASIVE CV LAB;  Service: Cardiovascular;  Laterality: N/A;   LEFT HEART CATH AND CORONARY ANGIOGRAPHY N/A 02/02/2021   Procedure: LEFT HEART CATH AND CORONARY ANGIOGRAPHY;  Surgeon: Millicent Ally, MD;  Location: [facility] INVAS[facility] CV LAB;  Service: Cardiovascular;  Laterality: N/A;   LEFT HEART CATH AND CORONARY ANGIOGRAPHY N/A 02/04/2021   Procedure: LEFT HEART CATH AND CORONARY ANGIOGRAPHY;  Surgeon: Millicent Ally, MD;  Location: [facility] INV[facility] CV [facility];  Service: Cardiovascular;  Laterality: N/A;   NO PAST SURGERIES     RIGHT HEART CATH N/A 03/31/2019   Procedure: RIGHT HEART CATH;  Surgeon: Millicent Ally, MD;  Location: [facility] INVASIVE CV LAB;  Service: Cardiovascular;  Laterality: N/A;   VENTRICULAR ASSIST DEVICE INSERTION N/A 03/31/2019   Procedure: VENTRICULAR ASSIST DEVICE INSERTION;  Surgeon: Millicent Ally, MD;  Location: [facility] INVASIVE CV LAB;  Service: Cardiovascular;  Laterality: N/A;    Current Medications: Outpatient Medications Prior to Visit  Medication Sig Dispense Refill   Accu-Chek Softclix Lancets lancets Use to measure blood sugar twice a day 100 each 6   albuterol  (PROVENTIL ) (2.5 MG/3ML) 0.083% nebulizer solution Take 3 mLs (2.5 mg total) by nebulization every 6 (six) hours as needed for wheezing or shortness of breath. 75 mL 12   albuterol  (VENTOLIN  HFA) 108 (90 Base) MCG/ACT inhaler INHALE 2 PUFFS BY MOUTH EVERY 6 HOURS AS NEEDED FOR WHEEZING FOR SHORTNESS OF BREATH 18 g 0   benzonatate  (TESSALON ) 200 MG capsule Take 1 capsule (200 mg total) by mouth 3 (three) times daily as needed. For cough 30 capsule 6   bisoprolol  (ZEBETA ) 5 MG tablet Take 1 tablet (5 mg total) by mouth daily. NEEDS FOLLOW UP APPOINTMENT FOR MORE REFILLS 30 tablet 0   Blood Glucose Monitoring Suppl (ACCU-CHEK GUIDE ME) w/Device KIT 1 kit by Does not apply route in the morning and at  bedtime. Use to measure blood sugar twice a day 1 kit 0   Budeson-Glycopyrrol-Formoterol  (BREZTRI  AEROSPHERE) 160-9-4.8 MCG/ACT AERO Inhale 2 puffs into the lungs 2 (two) times daily. 1 each 11   chlorpheniramine  (CHLOR-TRIMETON ) 4 MG tablet 1 tab tid prn runny nose 40 tablet 0   co-enzyme Q-10 30 MG capsule Take 1 capsule (30 mg total) by mouth daily. 90 capsule 3   empagliflozin  (JARDIANCE ) 10  MG TABS tablet Take 1 tablet (10 mg total) by mouth daily before breakfast. 30 tablet 2   eplerenone  (INSPRA ) 25 MG tablet Take 1 tablet (25 mg total) by mouth daily. 90 tablet 3   ezetimibe  (ZETIA ) 10 MG tablet Take 1 tablet (10 mg total) by mouth daily. 90 tablet 3   fluticasone  (FLONASE ) 50 MCG/ACT nasal spray USE 1 SPRAY IN EACH NOSTRIL ONCE DAILY AS NEEDED FOR ALLERGIES OR RHINITIS 48 g 0   furosemide  (LASIX ) 20 MG tablet Take 2 tablets (40 mg total) by mouth daily. (Patient taking differently: Take 20 mg by mouth daily. Takes one 20 mgh tablet daily) 180 tablet 3   glucose blood (ACCU-CHEK GUIDE) test strip Use to measure blood sugar twice a day 100 each 6   Hyprom-Naphaz-Polysorb-Zn Sulf (CLEAR EYES COMPLETE) SOLN Apply 1 drop to eye 3 (three) times daily. 15 mL 1   loratadine  (CLARITIN ) 10 MG tablet Take 1 tablet by mouth once daily 90 tablet 3   melatonin 1 MG TABS tablet Take 1 tablet (1 mg total) by mouth at bedtime. 30 tablet 1   montelukast  (SINGULAIR ) 10 MG tablet Take 1 tablet (10 mg total) by mouth at bedtime. 30 tablet 6   nitroGLYCERIN  (NITROSTAT ) 0.4 MG SL tablet Place 1 tablet (0.4 mg total) under the tongue every 5 (five) minutes x 3 doses as needed for chest pain. 25 tablet 3   omeprazole  (PRILOSEC) 20 MG capsule Take 1 capsule (20 mg total) by mouth daily. 90 capsule 1   potassium chloride  SA (KLOR-CON  M20) 20 MEQ tablet On the first day take 60meq (3 tablets) wait 4 hours then take another 60meq (3 tablets) by mouth. THEN THURSDAY 8/29 start 2 tablets by mouth 2 times daily. 90 tablet  3   pravastatin  (PRAVACHOL ) 80 MG tablet Take 1 tablet (80 mg total) by mouth every evening. 90 tablet 3   sacubitril -valsartan  (ENTRESTO ) 97-103 MG Take 1 tablet by mouth 2 (two) times daily. 180 tablet 2   ticagrelor  (BRILINTA ) 90 MG TABS tablet Take 1 tablet (90 mg total) by mouth 2 (two) times daily. 60 tablet 11   traZODone  (DESYREL ) 50 MG tablet TAKE HALF TABLET BY MOUTH AT BEDTIME 45 tablet 1   No facility-administered medications prior to visit.     Allergies:   Atorvastatin , Spironolactone , Metformin  and related, and Other   Social History   Socioeconomic History   Marital status: Married    Spouse name: Not on file   Number of children: 1   Years of education: 8   Highest education level: 8th grade  Occupational History   Not on file  Tobacco Use   Smoking status: Former    Current packs/day: 0.00    Types: Cigarettes    Quit date: 04/08/2019    Years since quitting: 4.7    Passive exposure: Current (wife smokes)   Smokeless tobacco: Never  Vaping Use   Vaping status: Never Used  Substance and Sexual Activity   Alcohol  use: No   Drug use: No   Sexual activity: Not Currently  Other Topics Concern   Not on file  Social History Narrative   Lives with wife   Right handed   Caffeine: about 2 cups of coffee every morning, no soda   Social Drivers of Corporate investment banker Strain: Low Risk  (09/08/2023)   Overall Financial Resource Strain (CARDIA)    Difficulty of Paying Living Expenses: Not very hard  Food Insecurity: No Food  Insecurity (09/08/2023)   Hunger Vital Sign    Worried About Running Out of Food in the Last Year: Never true    Ran Out of Food in the Last Year: Never true  Transportation Needs: No Transportation Needs (09/08/2023)   PRAPARE - Administrator, Civil Service (Medical): No    Lack of Transportation (Non-Medical): No  Physical Activity: Inactive (09/08/2023)   Exercise Vital Sign    Days of Exercise per Week: 0 days     Minutes of Exercise per Session: 0 min  Stress: No Stress Concern Present (09/08/2023)   [employer] of Occupational Health - Occupational Stress Questionnaire    Feeling of Stress : Not at all  Social Connections: Moderately Isolated (09/08/2023)   Social Connection and Isolation Panel [NHANES]    Frequency of Communication with Friends and Family: Once a week    Frequency of Social Gatherings with Friends and Family: More than three times a week    Attends Religious Services: Never    Database administrator or Organizations: No    Attends Engineer, structural: Never    Marital Status: Married     Family History:  The patient's family history includes Heart failure in his mother; Hypertension in his mother.   ROS General: Negative; No fevers, chills, or night sweats;  HEENT: Negative; No changes in vision or hearing, sinus congestion, difficulty swallowing Pulmonary: Negative; No cough, wheezing, shortness of breath, hemoptysis Cardiovascular: The HPI GI: Negative; No nausea, vomiting, diarrhea, or abdominal pain GU: Negative; No dysuria, hematuria, or difficulty voiding Musculoskeletal: Negative; no myalgias, joint pain, or weakness Hematologic/Oncology: Negative; no easy bruising, bleeding Endocrine: Negative; no heat/cold intolerance; no diabetes Neuro: Negative; no changes in balance, headaches Skin: Negative; No rashes or skin lesions Psychiatric: Negative; No behavioral problems, depression Sleep: Negative; No snoring, daytime sleepiness, hypersomnolence, bruxism, restless legs, hypnogognic hallucinations, no cataplexy Other comprehensive 14 point system review is negative.   PHYSICAL EXAM:   VS:  BP 126/82   Pulse 60   Ht 5\' 8"  (1.727 m)   Wt 198 lb (89.8 kg)   SpO2 98%   BMI 30.11 kg/m     Repeat blood pressure by me was 128/73  Wt Readings from Last 3 Encounters:  12/26/23 198 lb (89.8 kg)  10/13/23 204 lb 12.8 oz (92.9 kg)  09/08/23 200 lb (90.7  kg)    General: Alert, oriented, no distress.  Skin: normal turgor, no rashes, warm and dry HEENT: Normocephalic, atraumatic. Pupils equal round and reactive to light; sclera anicteric; extraocular muscles intact;  Nose without nasal septal hypertrophy Mouth/Parynx benign; Mallinpatti scale 3 Neck: No JVD, no carotid bruits; normal carotid upstroke Lungs: clear to ausculatation and percussion; no wheezing or rales Chest wall: without tenderness to palpitation Heart: PMI not displaced, RRR, s1 s2 normal, 1/6 systolic murmur, no diastolic murmur, no rubs, gallops, thrills, or heaves Abdomen: soft, nontender; no hepatosplenomehaly, BS+; abdominal aorta nontender and not dilated by palpation. Back: no CVA tenderness Pulses 2+ Musculoskeletal: full range of motion, normal strength, no joint deformities Extremities: no clubbing cyanosis or edema, Homan's sign negative  Neurologic: grossly nonfocal; Cranial nerves grossly wnl Psychologic: Normal mood and affect     Studies/Labs Reviewed:   EKG Interpretation Date/Time:  Tuesday Dec 26 2023 08:26:42 EDT Ventricular Rate:  60 PR Interval:  160 QRS Duration:  108 QT Interval:  414 QTC Calculation: 414 R Axis:   -37  Text Interpretation: Normal sinus rhythm Left  axis deviation Incomplete left bundle branch block T wave abnormality, consider lateral ischemia When compared with ECG of 13-Oct-2023 12:02, Criteria for Septal infarct are no longer Present ST now depressed in Anterior leads Confirmed by Magnus Schuller ([id number]) on 12/26/2023 9:06:33 AM    September 13, 2022 ECG (independently read by me): Sinus bradycardia at 53, QS V1-5 T wave inversion anteriorly    March 16, 2022 ECG (independently read by me): NSR at 73, LAE, QS V1-2  August 04, 2021 ECG (independently read by me):  NSR at 67, QS V1-2, PRWP V1-5; T wave abnormality anteriorly  March 24, 2021 ECG (independently read by me):  NSR at 81; Biatrial enlargement, QS V1-2, normal  intervals  Recent Labs:    Latest Ref Rng & Units 12/26/2023    9:28 AM 10/13/2023    1:18 PM 04/21/2023    8:53 AM  BMP  Glucose 70 - 99 mg/dL 604  540  981   BUN 8 - 27 mg/dL 14  21  16    Creatinine 0.76 - 1.27 mg/dL 1.91  4.78  2.95   BUN/Creat Ratio 10 - 24 14  17     Sodium 134 - 144 mmol/L 144  142  143   Potassium 3.5 - 5.2 mmol/L 3.8  4.7  4.0   Chloride 96 - 106 mmol/L 103  101  100   CO2 20 - 29 mmol/L 24  26  27    Calcium  8.6 - 10.2 mg/dL 9.1  9.8  9.2         Latest Ref Rng & Units 12/26/2023    9:28 AM 01/02/2023   11:50 AM 04/29/2022    4:18 PM  Hepatic Function  Total Protein 6.0 - 8.5 g/dL 6.4  7.0  7.1   Albumin 3.9 - 4.9 g/dL 4.5  4.5  4.6   AST 0 - 40 IU/L 28  25  28    ALT 0 - 44 IU/L 23  21  29    Alk Phosphatase 44 - 121 IU/L 108  108  104   Total Bilirubin 0.0 - 1.2 mg/dL 0.5  0.8  0.6   Bilirubin, Direct 0.00 - 0.40 mg/dL  6.21         Latest Ref Rng & Units 12/26/2023    9:28 AM 01/02/2023   11:50 AM 07/16/2022   10:40 PM  CBC  WBC 3.4 - 10.8 x10E3/uL 7.1  7.7  9.1   Hemoglobin 13.0 - 17.7 g/dL 30.8  65.7  84.6   Hematocrit 37.5 - 51.0 % 42.0  48.1  37.3   Platelets 150 - 450 x10E3/uL 218  288  185    Lab Results  Component Value Date   MCV 94 12/26/2023   MCV 91 01/02/2023   MCV 89.7 07/16/2022   Lab Results  Component Value Date   TSH 1.230 12/26/2023   Lab Results  Component Value Date   HGBA1C 5.9 09/08/2023     BNP    Component Value Date/Time   BNP 515.9 (H) 12/26/2023 0928   BNP 192.6 (H) 08/12/2022 1056    ProBNP    Component Value Date/Time   PROBNP 189.0 (H) 10/04/2021 1617     Lipid Panel     Component Value Date/Time   CHOL 131 12/26/2023 0928   TRIG 63 12/26/2023 0928   HDL 37 (L) 12/26/2023 0928   CHOLHDL 3.5 12/26/2023 0928   CHOLHDL 5.6 08/12/2022 1056   VLDL 14 08/12/2022 1056   LDLCALC 81  12/26/2023 0928   LABVLDL 13 12/26/2023 0928     RADIOLOGY: No results found.   Additional studies/ records  that were reviewed today include:   Emergent cath :February 02, 2021 Prox RCA lesion is 20% stenosed. Prox LAD to Mid LAD lesion is 15% stenosed. 1st Diag lesion is 5% stenosed. Ost LAD to Prox LAD lesion is 20% stenosed. Dist LAD-1 lesion is 40% stenosed. Dist LAD-2 lesion is 80% stenosed. There is severe left ventricular systolic dysfunction. LV end diastolic pressure is severely elevated. The left ventricular ejection fraction is 25-35% by visual estimate.   Patent proximal LAD stent with mild 20% in-stent narrowing.  There is significant thrombus burden immediately after the stent extending into the diagonal vessel and mid LAD with TIMI-3 flow.  There is a 305 mid distal LAD stenosis and  distal 80% stenosis in a small caliber distal LAD.   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  The dominant RCA had improvement in the previous 70% stenosis which now appeared approximately 20%.  The RCA supplied the PDA and PLA vessel   Acute LV dysfunction with EF estimated at 25 to 30% with severe hypocontractility involving the mid distal anterolateral wall apex and apical inferior segment.  Elevated LVEDP at 29 mmHg.   RECOMMENDATION: Patient was started on Aggrastat  in the catheterization laboratory and was bolused with Brilinta  180 mg orally.  He does not appear to have significant stenoses at the site of his thrombotic diagonal and LAD and plan continuation of Aggrastat  for approximately 48 hours with restudy.  Of note, the patient had recently stopped his ticagrelor  and a echo on December 14, 2020 had shown normalization of LV function without wall motion abnormalities.      02/04/2021: Repeat cath/PCI Ost LAD to Prox LAD lesion is 20% stenosed. Prox LAD to Mid LAD lesion is 40% stenosed. Dist LAD-1 lesion is 40% stenosed. Dist LAD-2 lesion is 80% stenosed. Prox RCA lesion is 20% stenosed. Post intervention,  there is a 0% residual stenosis. 1st Diag lesion is 5% stenosed.   Relook angiography of the left coronary system showed improvement in the previous thrombus burden in the diagonal and LAD distal stent and beyond but moderate residual thrombus was still present.   LVEDP 8 mmHg   Successful percutaneous coronary intervention of the LAD utilizing Pronto thrombectomy, intravascular ultrasound and ultimate PTCA of the distal stented segment and mid LAD with residual narrowing 0%.  Resolution of thrombus in the diagonal vessel with mild 5% narrowing.  Very mild thrombus distal stent in the region of the septal perforating artery.  There is brisk TIMI-3 flow.   RECOMMENDATION: DAPT indefinitely.  Would continue Aggrastat  for additional 24 to 36 hours.  GDMT Medical therapy for his LV function/ischemic cardiomyopathy.      Intervention     ECHO: July 27, 2021 IMPRESSIONS   1. Left ventricular ejection fraction, by estimation, is 35 to 40%. The  left ventricle has moderately decreased function. The left ventricle  demonstrates regional wall motion abnormalities (see scoring  diagram/findings for description). The left  ventricular internal cavity size was moderately dilated. Left ventricular  diastolic parameters are consistent with Grade II diastolic dysfunction  (pseudonormalization). There is akinesis of the left ventricular,  anteroseptal wall.   2. Right ventricular systolic function is normal. The right ventricular  size is normal.   3. Left atrial size was mildly dilated.   4. Right atrial  size was mildly dilated.   5. The mitral valve is normal in structure. Mild mitral valve  regurgitation. No evidence of mitral stenosis.   6. The aortic valve is tricuspid. Aortic valve regurgitation is not  visualized. No aortic stenosis is present.   7. The inferior vena cava is normal in size with greater than 50%  respiratory variability, suggesting right atrial pressure of 3 mmHg.     ECHO: 04/12/2023  1. Left ventricular ejection fraction, by estimation, is 35 to 40%. The  left ventricle has moderately decreased function. The left ventricle has  no regional wall motion abnormalities. The left ventricular internal  cavity size was moderately dilated.  Left ventricular diastolic parameters are consistent with Grade II  diastolic dysfunction (pseudonormalization). There is severe hypokinesis  of the left ventricular, mid-apical lateral wall and anterior wall. There  is severe hypokinesis of the left  ventricular, apical segment. The average left ventricular global  longitudinal strain is -14.8 %. The global longitudinal strain is  abnormal.   2. Right ventricular systolic function is normal. The right ventricular  size is mildly enlarged. There is mildly elevated pulmonary artery  systolic pressure. The estimated right ventricular systolic pressure is  41.2 mmHg.   3. Left atrial size was severely dilated.   4. Right atrial size was mildly dilated.   5. The mitral valve is normal in structure. Mild to moderate mitral valve  regurgitation. Mild to moderate mitral stenosis.   6. The aortic valve is normal in structure. Aortic valve regurgitation is  not visualized. No aortic stenosis is present.   7. The inferior vena cava is normal in size with greater than 50%  respiratory variability, suggesting right atrial pressure of 3 mmHg.   8. Recommend repeat limited study for more accurate assessment of wall  motion. No all endocardial segements were well visualized.   Comparison(s): Echocardiogram done 07/27/21 showed an EF of 35-40%.   ASSESSMENT:    1. CAD in native artery   2. STEMI involving left anterior descending coronary artery [facility]): August 2020; June 2022   3. Ischemic cardiomyopathy   4. Cardiac defibrillator in situ   5. Essential hypertension   6. Hyperlipidemia with target LDL less than 55   7. Myalgia due to statin     PLAN:  Stephen Jennings is  a 31 year old gentleman who has a history of COPD, type 2 diabetes mellitus, CAD, and had suffered an initial myocardial infarction complicated by respiratory failure and cardiogenic shock in June 2020 at which time he underwent successful PCI to totally occluded LAD.  He required Impella support and initial echo showed EF at 20 to 25%.  His hospital course was complicated by torsade and he underwent placement of an ICD prior to discharge.  His LV function subsequently normalized by April 2022 at which time his Brilinta  was discontinued.  Unfortunately 2 months later he presented with ACS secondary to extensive thrombus burden in the LAD for which he was treated with Aggrastat  and on relook 2 days later he underwent Pronto thrombectomy intravascular ultrasound and ultimate PTCA of the distal stented segment and mid LAD with residual narrowing of 0%.  Subsequently, he has been without anginal symptomatology.  At his office visit in December 2022 he was on optimal guideline directed medical therapy with Entresto  97/103 twice a day, Lasix  40 mg, bisoprolol  5 mg, and Jardiance .  He was no longer on spironolactone  due to hyperkalemia.  He has not had any further episodes of  VT or AF on ICD interrogation.  He has continued to be on DAPT with aspirin /Brilinta .  At his last office visit with me in July 2023 he had run out of his Entresto  for 5 to 6 weeks.  He has subsequently been back on therapy.  He underwent repeat ER evaluation for increasing symptoms in November treated with increased diuretic regimen.  Most recently, he was and was doing well when seen in advanced heart failure clinic in December 2023.  He underwent defibrillator check yesterday which was stable.  Presently, his blood pressure today is stable on his current guideline directed medical therapy consisting of bisoprolol  5 mg, Entresto  97/103 twice daily,eplerenone  25 mg, Jardiance  10 mg, furosemide  40 mg daily.  He has continued to be on ticagrelor  90 mg  twice a day and is no longer on aspirin .  Currently he is on pravastatin  80 mg in addition to Zetia  10 mg for hyperlipidemia.  In the past, due to inability to tolerate high-dose statin therapy there had been discussion concerning possible initiation of inclisiran but it appears that this has never been done.  LDL cholesterol in May 2024 was 122.  His last echo Doppler study on April 12, 2023 showed reduced EF at 35 to 40% with moderate LV dilation and wall motion abnormality involving the mid apical lateral, anterior and apical segment.  He had severe left atrial dilatation with mild RA dilatation and mild to moderate mitral stenosis.  Presently I am scheduling him for a comprehensive metabolic panel, CBC, TSH, BNP, fasting lipid panel and LP(a).  I am scheduling him for a follow-up echo Doppler study for reassessment of LV systolic and diastolic function and valvular architecture.  I discussed my plans for upcoming retirement.  Depending upon laboratory he will be referred to our pharmacist for consideration of PCSK9 inhibition or inclisiran.  I will transition him to the cardiology care of Dr. Arnoldo Lapping following my retirement.   Medication Adjustments/Labs and Tests Ordered: Current medicines are reviewed at length with the patient today.  Concerns regarding medicines are outlined above.  Medication changes, Labs and Tests ordered today are listed in the Patient Instructions below. Patient Instructions  Medication Instructions:  NO CHANGES *If you need a refill on your cardiac medications before your next appointment, please call your pharmacy*  Lab Work: CMP,CBC,TSH,BNP,FASTING LIPID PANEL, Lpa TODAY If you have labs (blood work) drawn today and your tests are completely normal, you will receive your results only by: MyChart Message (if you have MyChart) OR A paper copy in the mail If you have any lab test that is abnormal or we need to change your treatment, we will call you to review the  results.  Testing/Procedures:[address] Your physician has requested that you have an echocardiogram. Echocardiography is a painless test that uses sound waves to create images of your heart. It provides your doctor with information about the size and shape of your heart and how well your heart's chambers and valves are working. This procedure takes approximately one hour. There are no restrictions for this procedure. Please do NOT wear cologne, perfume, aftershave, or lotions (deodorant is allowed). Please arrive 15 minutes prior to your appointment time.  Please note: We ask at that you not bring children with you during ultrasound (echo/ vascular) testing. Due to room size and safety concerns, children are not allowed in the ultrasound rooms during exams. Our front office staff cannot provide observation of children in our lobby area while testing is  being conducted. An adult accompanying a patient to their appointment will only be allowed in the ultrasound room at the discretion of the ultrasound technician under special circumstances. We apologize for any inconvenience.   Follow-Up: At [facility], you and your health needs are our priority.  As part of our continuing mission to provide you with exceptional heart care, our providers are all part of one team.  This team includes your primary Cardiologist (physician) and Advanced Practice Providers or APPs (Physician Assistants and Nurse Practitioners) who all work together to provide you with the care you need, when you need it.  Your next appointment:   6 month(s)  Provider:   Rosan Comfort, MD   Signed, Magnus Schuller, MD  12/31/2023 12:06 PM    [facility] [address], [address], [place], [place]  [address] Phone: [phone]  Room

## 2023-12-27 ENCOUNTER — Other Ambulatory Visit: Payer: Self-pay

## 2023-12-27 LAB — CBC
Hematocrit: 42 % (ref 37.5–51.0)
Hemoglobin: 13.4 g/dL (ref 13.0–17.7)
MCH: 29.8 pg (ref 26.6–33.0)
MCHC: 31.9 g/dL (ref 31.5–35.7)
MCV: 94 fL (ref 79–97)
Platelets: 218 10*3/uL (ref 150–450)
RBC: 4.49 x10E6/uL (ref 4.14–5.80)
RDW: 13.5 % (ref 11.6–15.4)
WBC: 7.1 10*3/uL (ref 3.4–10.8)

## 2023-12-27 LAB — COMPREHENSIVE METABOLIC PANEL WITH GFR
ALT: 23 IU/L (ref 0–44)
AST: 28 IU/L (ref 0–40)
Albumin: 4.5 g/dL (ref 3.9–4.9)
Alkaline Phosphatase: 108 IU/L (ref 44–121)
BUN/Creatinine Ratio: 14 (ref 10–24)
BUN: 14 mg/dL (ref 8–27)
Bilirubin Total: 0.5 mg/dL (ref 0.0–1.2)
CO2: 24 mmol/L (ref 20–29)
Calcium: 9.1 mg/dL (ref 8.6–10.2)
Chloride: 103 mmol/L (ref 96–106)
Creatinine, Ser: 1.01 mg/dL (ref 0.76–1.27)
Globulin, Total: 1.9 g/dL (ref 1.5–4.5)
Glucose: 123 mg/dL — ABNORMAL HIGH (ref 70–99)
Potassium: 3.8 mmol/L (ref 3.5–5.2)
Sodium: 144 mmol/L (ref 134–144)
Total Protein: 6.4 g/dL (ref 6.0–8.5)
eGFR: 82 mL/min/{1.73_m2} (ref >59)

## 2023-12-27 LAB — LIPID PANEL
Chol/HDL Ratio: 3.5 ratio (ref 0.0–5.0)
Cholesterol, Total: 131 mg/dL (ref 100–199)
HDL: 37 mg/dL — ABNORMAL LOW (ref >39)
LDL Chol Calc (NIH): 81 mg/dL (ref 0–99)
Triglycerides: 63 mg/dL (ref 0–149)
VLDL Cholesterol Cal: 13 mg/dL (ref 5–40)

## 2023-12-27 LAB — TSH: TSH: 1.23 u[IU]/mL (ref 0.450–4.500)

## 2023-12-27 LAB — LIPOPROTEIN A (LPA): Lipoprotein (a): 104.1 nmol/L — ABNORMAL HIGH (ref <75.0)

## 2023-12-27 LAB — BRAIN NATRIURETIC PEPTIDE: BNP: 515.9 pg/mL — ABNORMAL HIGH (ref 0.0–100.0)

## 2023-12-29 ENCOUNTER — Other Ambulatory Visit: Payer: Self-pay

## 2023-12-31 ENCOUNTER — Encounter: Payer: Self-pay | Admitting: Cardiovascular Disease

## 2024-01-08 ENCOUNTER — Ambulatory Visit: Payer: Medicare (Managed Care) | Admitting: Internal Medicine

## 2024-01-08 ENCOUNTER — Ambulatory Visit: Payer: Self-pay | Admitting: *Deleted

## 2024-01-08 DIAGNOSIS — E782 Mixed hyperlipidemia: Secondary | ICD-10-CM

## 2024-01-08 DIAGNOSIS — I2510 Atherosclerotic heart disease of native coronary artery without angina pectoris: Secondary | ICD-10-CM

## 2024-01-08 DIAGNOSIS — E119 Type 2 diabetes mellitus without complications: Secondary | ICD-10-CM

## 2024-01-08 NOTE — Progress Notes (Deleted)
 Opened in error

## 2024-01-17 ENCOUNTER — Ambulatory Visit (INDEPENDENT_AMBULATORY_CARE_PROVIDER_SITE_OTHER): Payer: Medicare HMO

## 2024-01-17 DIAGNOSIS — I255 Ischemic cardiomyopathy: Secondary | ICD-10-CM

## 2024-01-18 LAB — CUP PACEART REMOTE DEVICE CHECK
Battery Remaining Longevity: 56 mo
Battery Remaining Percentage: 57 %
Battery Voltage: 2.96 V
Brady Statistic AP VP Percent: 1 %
Brady Statistic AP VS Percent: 13 %
Brady Statistic AS VP Percent: 1 %
Brady Statistic AS VS Percent: 86 %
Brady Statistic RA Percent Paced: 12 %
Brady Statistic RV Percent Paced: 1 %
Date Time Interrogation Session: 20250528040016
HighPow Impedance: 66 Ohm
HighPow Impedance: 66 Ohm
Implantable Lead Connection Status: 753985
Implantable Lead Connection Status: 753985
Implantable Lead Implant Date: 20200824
Implantable Lead Implant Date: 20200824
Implantable Lead Location: 753859
Implantable Lead Location: 753860
Implantable Pulse Generator Implant Date: 20200824
Lead Channel Impedance Value: 340 Ohm
Lead Channel Impedance Value: 380 Ohm
Lead Channel Pacing Threshold Amplitude: 0.75 V
Lead Channel Pacing Threshold Amplitude: 1 V
Lead Channel Pacing Threshold Pulse Width: 0.5 ms
Lead Channel Pacing Threshold Pulse Width: 0.5 ms
Lead Channel Sensing Intrinsic Amplitude: 1.1 mV
Lead Channel Sensing Intrinsic Amplitude: 11.9 mV
Lead Channel Setting Pacing Amplitude: 2 V
Lead Channel Setting Pacing Amplitude: 2.5 V
Lead Channel Setting Pacing Pulse Width: 0.5 ms
Lead Channel Setting Sensing Sensitivity: 0.5 mV
Pulse Gen Serial Number: 1300954

## 2024-01-21 ENCOUNTER — Ambulatory Visit: Payer: Self-pay | Admitting: Cardiology

## 2024-01-22 ENCOUNTER — Ambulatory Visit: Attending: Cardiology

## 2024-01-22 DIAGNOSIS — I5022 Chronic systolic (congestive) heart failure: Secondary | ICD-10-CM

## 2024-01-22 DIAGNOSIS — Z95810 Presence of automatic (implantable) cardiac defibrillator: Secondary | ICD-10-CM

## 2024-01-24 ENCOUNTER — Telehealth: Payer: Self-pay

## 2024-01-24 NOTE — Progress Notes (Signed)
 EPIC Encounter for ICM Monitoring  Patient Name: Stephen Jennings is a 68 y.o. male Date: 01/24/2024 Primary Care Physican: Lawrance Presume, MD Primary Cardiologist: Cooper/Bensimhon Electrophysiologist: [facility] 08/24/2022 Weight: 194 lbs (baseline 194-195) 01/24/2023 Weight: 188-190 lbs 03/20/2023 Weight: 188-190 lbs 07/05/2023 Weight: 189-190 lbs        09/08/2023 Office Weight: 200 lbs                                                    Attempted call to patient and unable to reach.    Transmission results reviewed.    Diet:  N/A   CorVue thoracic impedance suggesting possible fluid accumulation from 4/27-5/13 and possible dryness from 5/16-5/24.   Prescribed:  Furosemide  20 mg take 2 tablet(s) (40 mg total) by mouth daily.   Potassium 20 mEq take 2 tablets (40 mEq total) by mouth twice a day   Labs: 10/13/2023 Creatinine 1.22, BUN 21, Potassium 4.7, Sodium 142, GFR 65 04/21/2023 Creatinine 0.93, BUN 16, Potassium 4.0, Sodium 143, GFR >60 04/19/2023 Creatinine 1.16, BUN 18, Potassium 2.5, Sodium 139, GFR >60 (low K+ addressed by HF clinic) 04/12/2023 Creatinine 0.89, BUN 11, Potassium 3.1, Sodium 143, GFR >60  09/12/2022 Creatinine 1.09, BUN 15, Potassium 3.9, Sodium 143, GFR 75 08/29/2022 Creatinine 1.01, BUN 14, Potassium 3.2, Sodium 141, GFR >60 08/12/2022 Creatinine 1.17, BUN 19, Potassium 3.4, Sodium 140, GFR >60 A complete set of results can be found in Results Review.   Recommendations:  Unable to reach.       Follow-up plan: ICM clinic phone appointment on 03/18/2024.   91 day device clinic remote transmission 04/17/2024.     EP/Cardiology Office Visits:   Last visit with Dr Julane Ny was 04/12/2023 and needs appt.  06/23/2024 with Dr Arlester Ladd.  Recall 10/07/2024 with Michaelle Adolphus, PA.   Copy of ICM check sent to Dr. Lawana Pray.   3 month ICM trend: 01/22/2024.    12-14 Month ICM trend:     Almyra Jain, RN 01/24/2024 11:46 AM

## 2024-01-24 NOTE — Telephone Encounter (Signed)
 Remote ICM transmission received.  Attempted call to patient regarding ICM remote transmission and no answer.

## 2024-01-31 ENCOUNTER — Ambulatory Visit (HOSPITAL_COMMUNITY)
Admission: RE | Admit: 2024-01-31 | Discharge: 2024-01-31 | Disposition: A | Discharge status: Home / Self Care | Source: Ambulatory Visit | Attending: Cardiology | Admitting: Cardiology

## 2024-01-31 DIAGNOSIS — I255 Ischemic cardiomyopathy: Secondary | ICD-10-CM | POA: Diagnosis not present

## 2024-01-31 LAB — ECHOCARDIOGRAM COMPLETE
Area-P 1/2: 2.16 cm2
S' Lateral: 4.55 cm

## 2024-02-15 ENCOUNTER — Other Ambulatory Visit: Payer: Self-pay | Admitting: Internal Medicine

## 2024-02-15 DIAGNOSIS — G4700 Insomnia, unspecified: Secondary | ICD-10-CM

## 2024-02-15 DIAGNOSIS — Z6379 Other stressful life events affecting family and household: Secondary | ICD-10-CM

## 2024-02-17 ENCOUNTER — Other Ambulatory Visit: Payer: Self-pay | Admitting: Internal Medicine

## 2024-02-17 DIAGNOSIS — G4700 Insomnia, unspecified: Secondary | ICD-10-CM

## 2024-02-17 DIAGNOSIS — Z6379 Other stressful life events affecting family and household: Secondary | ICD-10-CM

## 2024-02-19 ENCOUNTER — Other Ambulatory Visit (HOSPITAL_COMMUNITY): Payer: Self-pay | Admitting: Family Medicine

## 2024-02-29 ENCOUNTER — Ambulatory Visit: Attending: Cardiology | Admitting: Pharmacist Clinician (PhC)/ Clinical Pharmacy Specialist

## 2024-02-29 ENCOUNTER — Other Ambulatory Visit: Payer: Self-pay | Admitting: Pharmacist Clinician (PhC)/ Clinical Pharmacy Specialist

## 2024-02-29 ENCOUNTER — Encounter: Payer: Self-pay | Admitting: Pharmacist Clinician (PhC)/ Clinical Pharmacy Specialist

## 2024-02-29 VITALS — BP 120/76

## 2024-02-29 DIAGNOSIS — E7849 Other hyperlipidemia: Secondary | ICD-10-CM

## 2024-02-29 DIAGNOSIS — I5022 Chronic systolic (congestive) heart failure: Secondary | ICD-10-CM | POA: Diagnosis not present

## 2024-02-29 NOTE — Assessment & Plan Note (Addendum)
 Assessment: Patient with ASCVD not at LDL goal of < 55 Most recent LDL 81 on 12/26/23 Has been compliant with moderate intensity statin/ezetimibe  : pravastatin  80 and ezetimibe  10 Not able to tolerate atorvastatin  secondary to myalgias Some concerns for myalgias (shoulders/feet) with pravastatin  Reviewed options for lowering LDL cholesterol, including PCSK-9 inhibitors, bempedoic acid and inclisiran.  Discussed mechanisms of action, dosing, side effects, potential decreases in LDL cholesterol and costs.  Also reviewed potential options for patient assistance.  Plan: Patient agreeable to starting Leqvio  Stop pravastatin  for 2 weeks, if symptoms resolve (shoulder/foot pains) then re-challenge with 40 mg daily (1/2 tablet).  If symptoms unchanged, resume 80 mg dose.  Repeat labs after: 1 month after second dose Lipid Liver function Patient was given information on [employer] - will sign patient up when PA approved Marital status Income < $72,000 (single) or < $102,000 (married)

## 2024-02-29 NOTE — Assessment & Plan Note (Signed)
 Patient currently on GDMT for CHF, has noted AM dizziness since dose of Entresto  was increased to 97/103 mg.  Will have him cut back to 49/51 mg bid for 2 weeks.  If symptoms resolve, we will leave him at the lower dose.  If symptoms continue, will resume the higher dose and look to other causes.  Patient agreeable to plan.

## 2024-02-29 NOTE — Progress Notes (Signed)
Leqvio ordered.

## 2024-02-29 NOTE — Patient Instructions (Addendum)
 Your Results:             Your most recent labs Goal  Total Cholesterol 131 < 200  Triglycerides 63 < 150  HDL (happy/good cholesterol) 37 > 40  LDL (lousy/bad cholesterol 81 < 55   Medication changes:  We will start the process to get Leqvio  covered by your insurance.  Once the prior authorization is complete, I will call to let you know and confirm pharmacy information.   You will take first two doses 3 months apart, then every 6 months thereafter  Stop pravastatin  for 2 weeks.  If you feel the same (feet and shoulders still bother you), then resume the pravastatin .  If you feel better, cut the pravastatin  in half and take 40 mg each day  Continue with the ezetimibe  for now   Cut the Entresto  to 49/51 mg twice daily for 2 weeks.  If you continue to feel dizziness then the medication is not the cause and you can go back to the 97/103 mg.  If you feel better, stay at the 49/51 mg dose and call to let me know  Lab orders:  We want to repeat labs after 4 months.  We will send you a lab order to remind you once we get closer to that time.    Patient Assistance:    We will sign you up for a [employer] Grant once your medication is approved by [employer].  I will call you with the ID number, then you will take this information to the pharmacy.  They will bill it after your insurance, bringing your copay to $0.  The grant will pay the first $2,500 in a one year period.    ID   BIN [id number]  PCN [id number]  GRP [id number]    Thank you for choosing [facility]  Allean Mink PharmD   [phone]

## 2024-02-29 NOTE — Progress Notes (Signed)
 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 male patient of Dr Sor (now retired), in the office today to discuss options for cholesterol management.  He will follow up with Dr. Ozell Fell in the future.  Today he complains of some shoulder pains as well as foot pains (bilateral).  Unsure if related to pravastatin  or not.  He also notes that he gets dizziness in the mornings after taking his medications, and links this to when the Entresto  was increased to 97/103 mg.  Does not notice this effect with the evening dose, but takes most of his other medications in the mornings as well.  He isn't sure if it is related to a drop in BP or if it is related at all.     Insurance Carrier: [employer] [phone]   Deductible $255; $47/month  [employer]:    apply for grant when medication approved  LDL Cholesterol goal:  LDL < 55  Current Medications:  pravastatin  80 mg daily, ezetimibe  10 mg daily   Previously tried:  atorvastatin  - myalgias  Family Hx:   mother had heart failure, hypertension  Social Hx: Tobacco: former Alcohol :   no   Diet:   eats more home cooked foods, occasional fried item, not as many vegetables, although getting fresh ones currently from his garden.     Exercise: none  Accessory Clinical Findings   Lab Results  Component Value Date   CHOL 131 12/26/2023   HDL 37 (L) 12/26/2023   LDLCALC 81 12/26/2023   TRIG 63 12/26/2023   CHOLHDL 3.5 12/26/2023    Lipoprotein (a)  Date/Time Value Ref Range Status  12/26/2023 09:28 AM 104.1 (H) <75.0 nmol/L Final    Comment:    Note:  Values greater than or equal to 75.0 nmol/L may        indicate an independent risk factor for CHD,        but must be evaluated with caution when applied        to non-Caucasian populations due to the        influence of genetic factors on Lp(a) across        ethnicities.     Lab Results  Component Value Date   ALT 23 12/26/2023   AST 28 12/26/2023   ALKPHOS 108 12/26/2023   BILITOT 0.5 12/26/2023   Lab Results  Component Value Date   CREATININE 1.01 12/26/2023   BUN 14 12/26/2023   NA 144 12/26/2023   K 3.8 12/26/2023   CL 103 12/26/2023   CO2 24 12/26/2023   Lab Results  Component Value Date   HGBA1C 5.9 09/08/2023    Home Medications    Current Outpatient Medications  Medication Sig Dispense Refill   Accu-Chek Softclix Lancets lancets Use to measure blood sugar twice a day 100 each 6   albuterol  (PROVENTIL ) (2.5 MG/3ML) 0.083% nebulizer solution Take 3 mLs (2.5 mg total) by nebulization every 6 (six) hours as needed for wheezing or shortness of breath. 75 mL 12   albuterol  (VENTOLIN  HFA) 108 (90 Base) MCG/ACT inhaler INHALE 2 PUFFS BY MOUTH EVERY 6 HOURS AS NEEDED FOR WHEEZING FOR SHORTNESS OF BREATH 18 g 0   benzonatate  (TESSALON ) 200 MG capsule Take 1 capsule (200 mg total) by mouth 3 (three) times daily as needed. For cough 30 capsule 6   bisoprolol  (ZEBETA ) 5 MG tablet Take 1 tablet (5 mg total) by mouth daily. NEEDS FOLLOW UP APPOINTMENT FOR MORE REFILLS 30 tablet 0   Blood Glucose Monitoring Suppl (ACCU-CHEK GUIDE ME) w/Device KIT 1 kit by Does not apply route in the morning and at bedtime. Use to measure blood sugar twice a day 1 kit  0   Budeson-Glycopyrrol-Formoterol  (BREZTRI  AEROSPHERE) 160-9-4.8 MCG/ACT AERO Inhale 2 puffs into the lungs 2 (two) times daily. 1 each 11   chlorpheniramine  (CHLOR-TRIMETON ) 4 MG tablet 1 tab tid prn runny nose 40 tablet 0   co-enzyme Q-10 30 MG capsule Take 1 capsule (30 mg total) by mouth daily. 90 capsule 3   empagliflozin  (JARDIANCE ) 10 MG TABS tablet Take 1 tablet (10 mg total) by mouth daily before breakfast. 30 tablet 2   eplerenone  (INSPRA ) 25 MG tablet Take 1 tablet (25 mg total) by mouth daily. 90 tablet 3   ezetimibe  (ZETIA ) 10 MG tablet Take 1 tablet (10 mg total) by mouth daily. 90 tablet 3   fluticasone  (FLONASE ) 50 MCG/ACT nasal spray USE 1 SPRAY IN EACH NOSTRIL ONCE DAILY AS NEEDED FOR ALLERGIES OR RHINITIS 48 g 0   furosemide  (LASIX ) 20 MG tablet Take 2 tablets (40 mg total) by mouth daily. (Patient taking differently: Take 20 mg by mouth daily. Takes one 20 mgh tablet daily) 180 tablet 3   glucose blood (ACCU-CHEK GUIDE) test strip Use to measure blood sugar twice a day 100 each 6   Hyprom-Naphaz-Polysorb-Zn Sulf (CLEAR EYES COMPLETE) SOLN Apply 1 drop to eye 3 (three) times daily. 15 mL 1   loratadine  (CLARITIN ) 10 MG tablet Take 1 tablet by mouth once daily 90 tablet 3   melatonin 1 MG TABS tablet Take 1 tablet (1 mg total) by mouth at bedtime. 30 tablet 1   montelukast  (SINGULAIR ) 10 MG tablet Take 1 tablet (10 mg total) by mouth at bedtime. 30 tablet 6   nitroGLYCERIN  (NITROSTAT ) 0.4 MG SL tablet Place 1 tablet (0.4 mg total) under the tongue every 5 (five) minutes x 3 doses as needed for chest pain. 25 tablet 3   omeprazole  (PRILOSEC) 20 MG capsule Take 1 capsule (20 mg total) by mouth daily. 90 capsule 1   potassium chloride  SA (KLOR-CON  M20) 20 MEQ tablet On the first day take 60meq (3 tablets) wait 4 hours then take another 60meq (3 tablets) by mouth. THEN THURSDAY 8/29 start 2 tablets by mouth 2 times daily. 90 tablet 3   pravastatin  (PRAVACHOL ) 80 MG tablet Take 1  tablet (80 mg total) by mouth every evening. 90 tablet 3   sacubitril -valsartan  (ENTRESTO ) 97-103 MG Take 1 tablet by mouth 2 (two) times daily. 180 tablet 2   ticagrelor  (BRILINTA ) 90 MG TABS tablet Take 1 tablet (90 mg total) by mouth 2 (two) times daily. 60 tablet 11   traZODone  (  DESYREL ) 50 MG tablet TAKE HALF TABLET BY MOUTH AT BEDTIME 15 tablet 0   No current facility-administered medications for this visit.     Assessment & Plan    Chronic systolic heart failure (HCC) Patient currently on GDMT for CHF, has noted AM dizziness since dose of Entresto  was increased to 97/103 mg.  Will have him cut back to 49/51 mg bid for 2 weeks.  If symptoms resolve, we will leave him at the lower dose.  If symptoms continue, will resume the higher dose and look to other causes.  Patient agreeable to plan.    Hyperlipidemia Assessment: Patient with ASCVD not at LDL goal of < 55 Most recent LDL 81 on 12/26/23 Has been compliant with moderate intensity statin/ezetimibe  : pravastatin  80 and ezetimibe  10 Not able to tolerate atorvastatin  secondary to myalgias Some concerns for myalgias (shoulders/feet) with pravastatin  Reviewed options for lowering LDL cholesterol, including PCSK-9 inhibitors, bempedoic acid and inclisiran.  Discussed mechanisms of action, dosing, side effects, potential decreases in LDL cholesterol and costs.  Also reviewed potential options for patient assistance.  Plan: Patient agreeable to starting Leqvio  Stop pravastatin  for 2 weeks, if symptoms resolve (shoulder/foot pains) then re-challenge with 40 mg daily (1/2 tablet).  If symptoms unchanged, resume 80 mg dose.  Repeat labs after: 1 month after second dose Lipid Liver function Patient was given information on [employer] - will sign patient up when PA approved Marital status Income < $72,000 (single) or < $102,000 (married)   Allean Mink, PharmD CPP [facility] [address]   [place], [place]  [address] [phone]  02/29/2024, 4:09 PM

## 2024-03-04 ENCOUNTER — Telehealth: Payer: Self-pay

## 2024-03-04 NOTE — Telephone Encounter (Signed)
 Auth Submission: DENIED Site of care: Site of care: [facility] Payer: UHC medicare Medication & CPT/J Code(s) submitted: Leqvio  (Inclisiran) J1306 Diagnosis Code:  Route of submission (phone, fax, portal): portal  Authorization has been DENIED because the patient but try and fail 12 consecutive weeks of Repatha or Praluent before insurance will cover Leqvio .

## 2024-03-05 NOTE — Telephone Encounter (Signed)
 Spoke with patient, he is willing to do Repatha.  Please do PA for Repatha 140 mg q14d. Thank you

## 2024-03-08 NOTE — Addendum Note (Signed)
 Addended by: VICCI SELLER A on: 03/08/2024 02:54 PM   Modules accepted: Orders

## 2024-03-08 NOTE — Progress Notes (Signed)
 Remote ICD transmission.

## 2024-03-14 ENCOUNTER — Telehealth: Payer: Self-pay | Admitting: Pharmacy Technician

## 2024-03-14 ENCOUNTER — Other Ambulatory Visit (HOSPITAL_COMMUNITY): Payer: Self-pay

## 2024-03-14 NOTE — Telephone Encounter (Signed)
 From pt calls   Pharmacy Patient Advocate Encounter   Received notification from Pt Calls Messages that prior authorization for repatha is required/requested.   Insurance verification completed.   The patient is insured through [facility] .   Per test claim: PA required; PA submitted to above mentioned insurance via latent Key/confirmation #/EOC [id number] Status is pending

## 2024-03-14 NOTE — Telephone Encounter (Signed)
 Pharmacy Patient Advocate Encounter  Received notification from [employer] that Prior Authorization for repatha 140mg  has been APPROVED from 03/14/24 to 09/14/24. Per test claims: it is 130.28 for 1 month  PA #/Case ID/Reference #: [id number]

## 2024-03-18 ENCOUNTER — Ambulatory Visit: Attending: Cardiology

## 2024-03-18 ENCOUNTER — Other Ambulatory Visit: Payer: Self-pay | Admitting: Internal Medicine

## 2024-03-18 DIAGNOSIS — Z95810 Presence of automatic (implantable) cardiac defibrillator: Secondary | ICD-10-CM

## 2024-03-18 DIAGNOSIS — G4700 Insomnia, unspecified: Secondary | ICD-10-CM

## 2024-03-18 DIAGNOSIS — I5022 Chronic systolic (congestive) heart failure: Secondary | ICD-10-CM | POA: Diagnosis not present

## 2024-03-18 DIAGNOSIS — Z6379 Other stressful life events affecting family and household: Secondary | ICD-10-CM

## 2024-03-18 MED ORDER — REPATHA SURECLICK 140 MG/ML ~~LOC~~ SOAJ: 140.0000 mg | SUBCUTANEOUS | 3 refills | Status: AC

## 2024-03-18 NOTE — Addendum Note (Signed)
 Addended by: Lurine Imel L on: 03/18/2024 11:00 AM   Modules accepted: Orders

## 2024-03-18 NOTE — Telephone Encounter (Signed)
 [employer] grant approved  ID        [id number] BIN      [id number] PCN    [id number] GRP    [id number]  Exp 02/15/25  Patient was given grant information, also included on Rx.  Will mail lab order in 2-3 months for follow up.

## 2024-03-20 ENCOUNTER — Other Ambulatory Visit (HOSPITAL_COMMUNITY): Payer: Self-pay | Admitting: Family Medicine

## 2024-03-21 NOTE — Progress Notes (Signed)
 EPIC Encounter for ICM Monitoring  Patient Name: Stephen Jennings is a 68 y.o. male Date: 03/21/2024 Primary Care Physican: Vicci Barnie NOVAK, MD Primary Cardiologist: Harding/Bensimhon Electrophysiologist: Inocencio 08/24/2022 Weight: 194 lbs (baseline 194-195) 12/26/2023 Office Weight: 198 lbs                                              Attempted call to patient and unable to reach.    Transmission results reviewed.    Diet:  N/A   CorVue thoracic impedance suggesting possible fluid accumulation from 7/17-7/26 and back to baseline 7/27.   Prescribed:  Furosemide  20 mg take 2 tablet(s) (40 mg total) by mouth daily.   Potassium 20 mEq take 2 tablets (40 mEq total) by mouth twice a day   Labs: 10/13/2023 Creatinine 1.22, BUN 21, Potassium 4.7, Sodium 142, GFR 65 04/21/2023 Creatinine 0.93, BUN 16, Potassium 4.0, Sodium 143, GFR >60 04/19/2023 Creatinine 1.16, BUN 18, Potassium 2.5, Sodium 139, GFR >60 (low K+ addressed by HF clinic) 04/12/2023 Creatinine 0.89, BUN 11, Potassium 3.1, Sodium 143, GFR >60  09/12/2022 Creatinine 1.09, BUN 15, Potassium 3.9, Sodium 143, GFR 75 08/29/2022 Creatinine 1.01, BUN 14, Potassium 3.2, Sodium 141, GFR >60 08/12/2022 Creatinine 1.17, BUN 19, Potassium 3.4, Sodium 140, GFR >60 A complete set of results can be found in Results Review.   Recommendations:  Unable to reach.       Follow-up plan: ICM clinic phone appointment on 04/23/2024.   91 day device clinic remote transmission 04/17/2024.     EP/Cardiology Office Visits:   05/02/2024 with Dr Cherrie.  06/23/2024 with Dr Wonda. 06/27/2024 with Dr Anner.  Recall 10/07/2024 with Jodie Passey, PA.   Copy of ICM check sent to Dr. Inocencio.   3 month ICM trend: 03/18/2024.    12-14 Month ICM trend:     Mitzie GORMAN Garner, RN 03/21/2024 3:35 PM

## 2024-04-04 ENCOUNTER — Other Ambulatory Visit: Payer: Self-pay

## 2024-04-17 ENCOUNTER — Ambulatory Visit (INDEPENDENT_AMBULATORY_CARE_PROVIDER_SITE_OTHER): Payer: Medicare HMO

## 2024-04-17 DIAGNOSIS — I255 Ischemic cardiomyopathy: Secondary | ICD-10-CM

## 2024-04-17 LAB — CUP PACEART REMOTE DEVICE CHECK
Battery Remaining Longevity: 55 mo
Battery Remaining Percentage: 56 %
Battery Voltage: 2.96 V
Brady Statistic AP VP Percent: 1 %
Brady Statistic AP VS Percent: 11 %
Brady Statistic AS VP Percent: 1 %
Brady Statistic AS VS Percent: 89 %
Brady Statistic RA Percent Paced: 9.7 %
Brady Statistic RV Percent Paced: 1 %
Date Time Interrogation Session: 20250827040015
HighPow Impedance: 64 Ohm
HighPow Impedance: 64 Ohm
Implantable Lead Connection Status: 753985
Implantable Lead Connection Status: 753985
Implantable Lead Implant Date: 20200824
Implantable Lead Implant Date: 20200824
Implantable Lead Location: 753859
Implantable Lead Location: 753860
Implantable Pulse Generator Implant Date: 20200824
Lead Channel Impedance Value: 340 Ohm
Lead Channel Impedance Value: 380 Ohm
Lead Channel Pacing Threshold Amplitude: 0.75 V
Lead Channel Pacing Threshold Amplitude: 1 V
Lead Channel Pacing Threshold Pulse Width: 0.5 ms
Lead Channel Pacing Threshold Pulse Width: 0.5 ms
Lead Channel Sensing Intrinsic Amplitude: 1.3 mV
Lead Channel Sensing Intrinsic Amplitude: 9.4 mV
Lead Channel Setting Pacing Amplitude: 2 V
Lead Channel Setting Pacing Amplitude: 2.5 V
Lead Channel Setting Pacing Pulse Width: 0.5 ms
Lead Channel Setting Sensing Sensitivity: 0.5 mV
Pulse Gen Serial Number: 1300954

## 2024-04-18 ENCOUNTER — Ambulatory Visit: Payer: Self-pay | Admitting: Cardiology

## 2024-04-23 ENCOUNTER — Ambulatory Visit: Attending: Cardiology

## 2024-04-23 DIAGNOSIS — I5022 Chronic systolic (congestive) heart failure: Secondary | ICD-10-CM

## 2024-04-23 DIAGNOSIS — Z95810 Presence of automatic (implantable) cardiac defibrillator: Secondary | ICD-10-CM | POA: Diagnosis not present

## 2024-04-23 NOTE — Progress Notes (Signed)
 EPIC Encounter for ICM Monitoring  Patient Name: Stephen Jennings is a 68 y.o. male Date: 04/23/2024 Primary Care Physican: Vicci Barnie NOVAK, MD Primary Cardiologist: Harding/Bensimhon Electrophysiologist: [facility] 08/24/2022 Weight: 194 lbs (baseline 194-195) 12/26/2023 Office Weight: 198 lbs   04/23/2024 Weight: 195 lbs                                            Spoke with patient and heart failure questions reviewed.  Transmission results reviewed.  Pt reports feeling a little Stephen Jennings of breath.  He did miss a dose of Lasix  this week.   Diet:  N/A   CorVue thoracic impedance suggesting possible fluid accumulation starting 8/24 and trending back toward baseline.  Also suggesting possible fluid accumulation from 8/12-8/16.   Prescribed:  Furosemide  20 mg take 2 tablet(s) (40 mg total) by mouth daily.   Potassium 20 mEq take 2 tablets (40 mEq total) by mouth twice a day   Labs: 12/26/2023 Creatinine 1.01, BUN 14, Potassium 3.8, Sodium 144, Gfr 82 10/13/2023 Creatinine 1.22, BUN 21, Potassium 4.7, Sodium 142, GFR 65 A complete set of results can be found in Results Review.   Recommendations:  He will take 1 extra 20 mg Furosemide  tablet tomorrow and then back to 2 tablets daily.  Advised to call if he has any fluid symptoms return.     Follow-up plan: ICM clinic phone appointment on 06/03/2024.   91 day device clinic remote transmission 07/17/2024.     EP/Cardiology Office Visits:   05/02/2024 with Dr Cherrie.  06/23/2024 with Dr Wonda. 06/27/2024 with Dr Anner.  Recall 10/07/2024 with Jodie Passey, PA.   Copy of ICM check sent to Dr. Inocencio.   3 month ICM trend: 04/23/2024.    12-14 Month ICM trend:     Stephen GORMAN Garner, RN 04/23/2024 4:47 PM

## 2024-04-24 ENCOUNTER — Telehealth: Payer: Self-pay | Admitting: Pharmacist Clinician (PhC)/ Clinical Pharmacy Specialist

## 2024-04-24 DIAGNOSIS — E7849 Other hyperlipidemia: Secondary | ICD-10-CM

## 2024-04-24 DIAGNOSIS — I2510 Atherosclerotic heart disease of native coronary artery without angina pectoris: Secondary | ICD-10-CM

## 2024-04-24 NOTE — Telephone Encounter (Signed)
 Lab order mailed to patient - lipid, Lp(a)

## 2024-05-02 ENCOUNTER — Ambulatory Visit (HOSPITAL_COMMUNITY)
Admission: RE | Admit: 2024-05-02 | Discharge: 2024-05-02 | Disposition: A | Discharge status: Home / Self Care | Source: Ambulatory Visit | Attending: Internal Medicine | Admitting: Internal Medicine

## 2024-05-02 ENCOUNTER — Encounter (HOSPITAL_COMMUNITY): Payer: Self-pay | Admitting: Internal Medicine

## 2024-05-02 VITALS — BP 142/82 | HR 65 | Ht 68.0 in | Wt 196.2 lb

## 2024-05-02 DIAGNOSIS — I2510 Atherosclerotic heart disease of native coronary artery without angina pectoris: Secondary | ICD-10-CM | POA: Insufficient documentation

## 2024-05-02 DIAGNOSIS — I252 Old myocardial infarction: Secondary | ICD-10-CM | POA: Insufficient documentation

## 2024-05-02 DIAGNOSIS — I5022 Chronic systolic (congestive) heart failure: Secondary | ICD-10-CM | POA: Insufficient documentation

## 2024-05-02 DIAGNOSIS — Z79899 Other long term (current) drug therapy: Secondary | ICD-10-CM | POA: Diagnosis not present

## 2024-05-02 DIAGNOSIS — I110 Hypertensive heart disease with heart failure: Secondary | ICD-10-CM | POA: Diagnosis not present

## 2024-05-02 DIAGNOSIS — Z87891 Personal history of nicotine dependence: Secondary | ICD-10-CM | POA: Diagnosis not present

## 2024-05-02 DIAGNOSIS — I255 Ischemic cardiomyopathy: Secondary | ICD-10-CM | POA: Diagnosis not present

## 2024-05-02 DIAGNOSIS — Z7984 Long term (current) use of oral hypoglycemic drugs: Secondary | ICD-10-CM | POA: Diagnosis not present

## 2024-05-02 DIAGNOSIS — J449 Chronic obstructive pulmonary disease, unspecified: Secondary | ICD-10-CM | POA: Insufficient documentation

## 2024-05-02 DIAGNOSIS — Z8674 Personal history of sudden cardiac arrest: Secondary | ICD-10-CM | POA: Insufficient documentation

## 2024-05-02 DIAGNOSIS — E119 Type 2 diabetes mellitus without complications: Secondary | ICD-10-CM | POA: Diagnosis not present

## 2024-05-02 DIAGNOSIS — Z955 Presence of coronary angioplasty implant and graft: Secondary | ICD-10-CM | POA: Insufficient documentation

## 2024-05-02 NOTE — Patient Instructions (Addendum)
 NO CHANGES TODAY!   Follow-Up in: 12 MON FOLLOW UP with Dr. Cherrie PLEASE CALL OUR OFFICE AROUND JULY of 2026 TO GET SCHEDULED FOR YOUR APPOINTMENT. PHONE NUMBER IS [phone] OPTION 2   At the [facility], you and your health needs are our priority. We have a designated team specialized in the treatment of Heart Failure. This Care Team includes your primary Heart Failure Specialized Cardiologist (physician), Advanced Practice Providers (APPs- Physician Assistants and Nurse Practitioners), and Pharmacist who all work together to provide you with the care you need, when you need it.   You may see any of the following providers on your designated Care Team at your next follow up:  Dr. Toribio Cherrie Dr. Ezra Shuck Dr. Ria Commander Dr. Odis Brownie Greig Mosses, NP Caffie Shed, [place] [facility], [place] Beckey Coe, NP [place] Lee, NP Tinnie Redman, PharmD   Please be sure to bring in all your medications bottles to every appointment.   Need to Contact Us :  If you have any questions or concerns before your next appointment please send us  a message through [place] or call our office at [phone].    TO LEAVE A MESSAGE FOR THE NURSE SELECT OPTION 2, PLEASE LEAVE A MESSAGE INCLUDING: YOUR NAME DATE OF BIRTH CALL BACK NUMBER REASON FOR CALL**this is important as we prioritize the call backs  YOU WILL RECEIVE A CALL BACK THE SAME DAY AS LONG AS YOU CALL BEFORE 4:00 PM

## 2024-05-02 NOTE — Progress Notes (Signed)
 [facility] Note   PCP: Vicci Barnie NOVAK, MD Primary Cardiologist: Debby Sor, MD (Inactive)  HF Cardiologist: Dr. Cherrie  HPI: Stephen Jennings is a 68 y.o. 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    Brought to ER 03/31/19 with respiratory failure/cardiac arrest. Intubated in ER. Initial ECG with anterior ST elevation. Cath by Dr. Sor with 3v CAD and totally occluded prox LAD. LCX anomalous off RCA. Underwent PCI of LAD which was diffusely diseased vessel. Developed cardiogenic shock requiring pressors and mechanical support with impella.  03/31/2019 Echo 20-25%. Hospital course complicated by Torsades. EP consulted placed ICD prior to discharge.    Echo 2/21  EF 45-50%   Echo 4/22 EF 55-60%    He stopped Brillinta in 4/22. Admitted in 6/22 with NSTEMI. Cath with patent proximal LAD stent with mild 20% in-stent narrowing.  There is significant thrombus burden immediately after the stent extending into the diagonal vessel and mid LAD with TIMI-3 flow. Treated with Aggrastat  x 48 hours. ECHO 02/02/21 EF 35-40% Relook angiography of the left coronary system showed improvement in the previous thrombus burden in the diagonal and LAD distal stent and beyond but moderate residual thrombus was still present. Underwent successful percutaneous coronary intervention of the LAD utilizing Pronto thrombectomy, intravascular ultrasound and ultimate PTCA of the distal stented segment and mid LAD with residual narrowing 0%. Recommended indefinite DAPT.   Echo 07/27/21 EF 35-40%.   Echo 6/25 EF 40-45%  Today he returns for HF follow up. Works at his own [employer]. Denies CP or SOB. No edema. Compliant with meds. Just started repatha .     Past Medical History:  Diagnosis Date   Cardiogenic shock (HCC)    CHF (congestive heart failure) (HCC)    Coronary artery disease    Diabetes mellitus without complication (HCC)    Hyperlipidemia    Medical history  non-contributory    Current Outpatient Medications  Medication Sig Dispense Refill   Accu-Chek Softclix Lancets lancets Use to measure blood sugar twice a day 100 each 6   albuterol  (PROVENTIL ) (2.5 MG/3ML) 0.083% nebulizer solution Take 3 mLs (2.5 mg total) by nebulization every 6 (six) hours as needed for wheezing or shortness of breath. 75 mL 12   albuterol  (VENTOLIN  HFA) 108 (90 Base) MCG/ACT inhaler INHALE 2 PUFFS BY MOUTH EVERY 6 HOURS AS NEEDED FOR WHEEZING FOR SHORTNESS OF BREATH 18 g 0   benzonatate  (TESSALON ) 200 MG capsule Take 1 capsule (200 mg total) by mouth 3 (three) times daily as needed. For cough 30 capsule 6   bisoprolol  (ZEBETA ) 5 MG tablet Take 1 tablet (5 mg total) by mouth daily. NEEDS FOLLOW UP APPOINTMENT FOR MORE REFILLS 30 tablet 0   Blood Glucose Monitoring Suppl (ACCU-CHEK GUIDE ME) w/Device KIT 1 kit by Does not apply route in the morning and at bedtime. Use to measure blood sugar twice a day 1 kit 0   Budeson-Glycopyrrol-Formoterol  (BREZTRI  AEROSPHERE) 160-9-4.8 MCG/ACT AERO Inhale 2 puffs into the lungs 2 (two) times daily. 1 each 11   chlorpheniramine  (CHLOR-TRIMETON ) 4 MG tablet 1 tab tid prn runny nose 40 tablet 0   co-enzyme Q-10 30 MG capsule Take 1 capsule (30 mg total) by mouth daily. 90 capsule 3   empagliflozin  (JARDIANCE ) 10 MG TABS tablet Take 1 tablet (10 mg total) by mouth daily before breakfast. 30 tablet 2   eplerenone  (INSPRA ) 25 MG tablet Take 0.5 tablets (12.5 mg total) by mouth daily. PLEASE  SCHEDULE APPOINTMENT FOR MORE REFILLS 45 tablet 0   Evolocumab  (REPATHA  SURECLICK) 140 MG/ML SOAJ Inject 140 mg into the skin every 14 (fourteen) days. 6 mL 3   ezetimibe  (ZETIA ) 10 MG tablet Take 1 tablet (10 mg total) by mouth daily. 90 tablet 3   fluticasone  (FLONASE ) 50 MCG/ACT nasal spray USE 1 SPRAY IN EACH NOSTRIL ONCE DAILY AS NEEDED FOR ALLERGIES OR RHINITIS 48 g 0   furosemide  (LASIX ) 20 MG tablet Take 2 tablets (40 mg total) by mouth daily. (Patient  taking differently: Take 20 mg by mouth daily. Takes one 20 mgh tablet daily) 180 tablet 3   glucose blood (ACCU-CHEK GUIDE) test strip Use to measure blood sugar twice a day 100 each 6   Hyprom-Naphaz-Polysorb-Zn Sulf (CLEAR EYES COMPLETE) SOLN Apply 1 drop to eye 3 (three) times daily. 15 mL 1   loratadine  (CLARITIN ) 10 MG tablet Take 1 tablet by mouth once daily 90 tablet 3   melatonin 1 MG TABS tablet Take 1 tablet (1 mg total) by mouth at bedtime. 30 tablet 1   montelukast  (SINGULAIR ) 10 MG tablet Take 1 tablet (10 mg total) by mouth at bedtime. 30 tablet 6   nitroGLYCERIN  (NITROSTAT ) 0.4 MG SL tablet Place 1 tablet (0.4 mg total) under the tongue every 5 (five) minutes x 3 doses as needed for chest pain. 25 tablet 3   omeprazole  (PRILOSEC) 20 MG capsule Take 1 capsule (20 mg total) by mouth daily. 90 capsule 1   potassium chloride  SA (KLOR-CON  M20) 20 MEQ tablet On the first day take 60meq (3 tablets) wait 4 hours then take another 60meq (3 tablets) by mouth. THEN THURSDAY 8/29 start 2 tablets by mouth 2 times daily. 90 tablet 3   pravastatin  (PRAVACHOL ) 80 MG tablet Take 1 tablet (80 mg total) by mouth every evening. 90 tablet 3   sacubitril -valsartan  (ENTRESTO ) 97-103 MG Take 1 tablet by mouth 2 (two) times daily. 180 tablet 2   ticagrelor  (BRILINTA ) 90 MG TABS tablet Take 1 tablet (90 mg total) by mouth 2 (two) times daily. 60 tablet 11   traZODone  (DESYREL ) 50 MG tablet TAKE HALF TABLET BY MOUTH AT BEDTIME 30 tablet 1   No current facility-administered medications for this encounter.   Allergies  Allergen Reactions   Atorvastatin      Muscle weakness and fatigue on 80mg  daily   Spironolactone      gynecomastia   Metformin  And Related Anxiety and Palpitations   Other Anxiety and Palpitations   Social History   Socioeconomic History   Marital status: Married    Spouse name: Not on file   Number of children: 1   Years of education: 8   Highest education level: 8th grade   Occupational History   Not on file  Tobacco Use   Smoking status: Former    Current packs/day: 0.00    Types: Cigarettes    Quit date: 04/08/2019    Years since quitting: 5.0    Passive exposure: Current (wife smokes)   Smokeless tobacco: Never  Vaping Use   Vaping status: Never Used  Substance and Sexual Activity   Alcohol  use: No   Drug use: No   Sexual activity: Not Currently  Other Topics Concern   Not on file  Social History Narrative   Lives with wife   Right handed   Caffeine: about 2 cups of coffee every morning, no soda   Social Drivers of Health   Financial Resource Strain: Low Risk  (09/08/2023)  Overall Financial Resource Strain (CARDIA)    Difficulty of Paying Living Expenses: Not very hard  Food Insecurity: No Food Insecurity (09/08/2023)   Hunger Vital Sign    Worried About Running Out of Food in the Last Year: Never true    Ran Out of Food in the Last Year: Never true  Transportation Needs: No Transportation Needs (09/08/2023)   PRAPARE - Administrator, Civil Service (Medical): No    Lack of Transportation (Non-Medical): No  Physical Activity: Inactive (09/08/2023)   Exercise Vital Sign    Days of Exercise per Week: 0 days    Minutes of Exercise per Session: 0 min  Stress: No Stress Concern Present (09/08/2023)   [employer] of Occupational Health - Occupational Stress Questionnaire    Feeling of Stress : Not at all  Social Connections: Moderately Isolated (09/08/2023)   Social Connection and Isolation Panel    Frequency of Communication with Friends and Family: Once a week    Frequency of Social Gatherings with Friends and Family: More than three times a week    Attends Religious Services: Never    Database administrator or Organizations: No    Attends Banker Meetings: Never    Marital Status: Married  Catering manager Violence: Not At Risk (09/08/2023)   Humiliation, Afraid, Rape, and Kick questionnaire    Fear of  Current or Ex-Partner: No    Emotionally Abused: No    Physically Abused: No    Sexually Abused: No   Family History  Problem Relation Age of Onset   Hypertension Mother    Heart failure Mother    Headache Neg Hx    Migraines Neg Hx    BP (!) 142/82   Pulse 65   Ht 5' 8 (1.727 m)   Wt 89 kg (196 lb 3.2 oz)   SpO2 96%   BMI 29.83 kg/m   Wt Readings from Last 3 Encounters:  05/02/24 89 kg (196 lb 3.2 oz)  12/26/23 89.8 kg (198 lb)  10/13/23 92.9 kg (204 lb 12.8 oz)   PHYSICAL EXAM: General:  Well appearing. No resp difficulty HEENT: normal Neck: supple. no JVD. Carotids 2+ bilat; no bruits. No lymphadenopathy or thryomegaly appreciated. Cor: PMI nondisplaced. Regular rate & rhythm. No rubs, gallops or murmurs. Lungs: clear Abdomen: soft, nontender, nondistended. No hepatosplenomegaly. No bruits or masses. Good bowel sounds. Extremities: no cyanosis, clubbing, rash, edema Neuro: alert & orientedx3, cranial nerves grossly intact. moves all 4 extremities w/o difficulty. Affect pleasant  ECG: NSR 65 septal Qs .Personally reviewed   ASSESSMENT & PLAN: 1. Chronic systolic HF - Echo 8/20 EF20-25% - Echo 09/23/19: EF 45-50% - Echo 4/22: EF 55-60%  - NSTEMI in 6/22 -> Echo 6/22 EF 35-40% - Echo  07/27/21 EF 35-40% .  - Echo 6/25 EF 40-45% - NYHA I. Volume status ok  - Continue Entresto  to 97/103 mg bid. - Continue bisoprolol  5 mg daily. - Continue Jardiance  10 mg daily. - Continue eplerenone  12.5 mg daily. - ICD interrogated in clinic personally  - Recent bloodwork in 5/25 ok K 3.8 SCr 1.0  2. 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 - ICD interrogated in clinic personally. NoVT   3. CAD  - s/p acute anterior MI 8/20 with PCI/DES to LAD. 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 - NSTEMI in 6/22 after stopping Brillinta. Stent patent but clot after stent -> s/p thrombectomy and POBA - Continue  DAPT and statin indefinitely. - No chest  pain.  - Continue Jardiance  10 mg daily. - Last LDL    4. DM2 - On metformin  & Jardiance .  - Per PCP   5. COPD - Stable. Continue inhalers - No longer smoking  6. HTN - Blood pressure well controlled. Continue current regimen.    Toribio Fuel, MD 05/02/24

## 2024-05-06 NOTE — Progress Notes (Signed)
Remote ICD Transmission.

## 2024-05-13 DIAGNOSIS — I2510 Atherosclerotic heart disease of native coronary artery without angina pectoris: Secondary | ICD-10-CM | POA: Diagnosis not present

## 2024-05-13 DIAGNOSIS — E7849 Other hyperlipidemia: Secondary | ICD-10-CM | POA: Diagnosis not present

## 2024-05-14 ENCOUNTER — Other Ambulatory Visit: Payer: Self-pay | Admitting: Internal Medicine

## 2024-05-14 DIAGNOSIS — J439 Emphysema, unspecified: Secondary | ICD-10-CM

## 2024-05-14 LAB — LIPID PANEL
Chol/HDL Ratio: 2 ratio (ref 0.0–5.0)
Cholesterol, Total: 103 mg/dL (ref 100–199)
HDL: 52 mg/dL (ref >39)
LDL Chol Calc (NIH): 38 mg/dL (ref 0–99)
Triglycerides: 58 mg/dL (ref 0–149)
VLDL Cholesterol Cal: 13 mg/dL (ref 5–40)

## 2024-05-15 LAB — LIPOPROTEIN A (LPA): Lipoprotein (a): 103.8 nmol/L — ABNORMAL HIGH (ref <75.0)

## 2024-05-20 ENCOUNTER — Ambulatory Visit: Payer: Self-pay | Admitting: Pharmacist Clinician (PhC)/ Clinical Pharmacy Specialist

## 2024-05-29 ENCOUNTER — Ambulatory Visit: Admitting: Pulmonary Disease

## 2024-05-29 ENCOUNTER — Encounter: Payer: Self-pay | Admitting: Pulmonary Disease

## 2024-05-29 DIAGNOSIS — J449 Chronic obstructive pulmonary disease, unspecified: Secondary | ICD-10-CM | POA: Diagnosis not present

## 2024-05-29 DIAGNOSIS — Z87891 Personal history of nicotine dependence: Secondary | ICD-10-CM | POA: Diagnosis not present

## 2024-05-29 DIAGNOSIS — R0609 Other forms of dyspnea: Secondary | ICD-10-CM

## 2024-05-29 DIAGNOSIS — J45909 Unspecified asthma, uncomplicated: Secondary | ICD-10-CM | POA: Diagnosis not present

## 2024-05-29 MED ORDER — BREZTRI AEROSPHERE 160-9-4.8 MCG/ACT IN AERO: 2.0000 | INHALATION_SPRAY | Freq: Two times a day (BID) | RESPIRATORY_TRACT | 4 refills | Status: DC

## 2024-05-29 NOTE — Progress Notes (Signed)
 @Patient  ID: Stephen Jennings, male    DOB: July 06, 1956, 68 y.o.   MRN: [id number]  No chief complaint on file.   Referring provider: Vicci Barnie NOVAK, MD  HPI:   68 y.o. 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  Most recent cardiology note x2 reviewed.  Most recent PCP note reviewed.  Overall, his breathing continues to do well on Breztri .  No exacerbations.  Breathing good.  Still working a bit.  No issues.  HPI initial visit: Patient notes several weeks ago he felt ill.  Cough, sore throat, runny nose.  Felt like his virus.  Felt more rundown, short of breath during that time.  Gradually symptoms seem to slowly improve.  However had residual dyspnea on exertion.  Worse with inclines or stairs.  Better with rest.  No environmental or seasonal changes he can identify to contribute.  Did not really try much to make things better or worse.  Cannot identify clear exacerbating or alleviating factors.  Also/13, he developed chest pain with radiation down left arm while doing yard work.  Presented to the ED. Troponins elevated. Medical therapy for NSTEMI started. LHC 6/14 with narrowing in prior stent in LAD. Placed on aggrastat  and bolused brilinta . Repeat LHC 6/16 with improved narrowing/thrombus in prior stent. TTE with new reduced EF compared to 10/2020. Diuresed, and started on lasix  daily. He states this has helped immensely with DOE. Denies any respiratory issues. Denies any limitations due to dyspnea.  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  Working without issue.  CXR 02/01/21 on my review and interpretation with hyperinflation on lateral view, bronchitic changes bilaterally.  PMH: tobacco abuse in remission, CAD s/p DES Surgical history: cardiac cath Family History: CHF, HTN in mom Social history: former smoker, quit 2020 at time of first heart attack, 40 pack year history, lives in [place] /  Pulmonary Flowsheets:   ACT:      No data to display          MMRC:     No data to display          Epworth:      No data to display          Tests:   FENO:  No results found for: NITRICOXIDE  PFT:    Latest Ref Rng & Units 10/07/2021    9:44 AM  PFT Results  FVC-Pre L 2.77   FVC-Predicted Pre % 60   FVC-Post L 3.36   FVC-Predicted Post % 73   Pre FEV1/FVC % % 58   Post FEV1/FCV % % 62   FEV1-Pre L 1.62   FEV1-Predicted Pre % 47   FEV1-Post L 2.07   DLCO uncorrected ml/min/mmHg 20.92   DLCO UNC% % 78   DLCO corrected ml/min/mmHg 21.56   DLCO COR %Predicted % 80   DLVA Predicted % 82   TLC L 8.34   TLC % Predicted % 118   RV % Predicted % 227   Personally reviewed and interpreted as severe fixed obstruction with significant improvement after significant bronchodilator response.  Lung volumes consistent with air trapping. WALK:      No data to display          Imaging: No results found.  Lab Results: Personally reviewed and as per per EMR, eos 300 2020 CBC    Component Value Date/Time   WBC 7.1 12/26/2023 0928  WBC 9.1 07/16/2022 2240   RBC 4.49 12/26/2023 0928   RBC 4.16 (L) 07/16/2022 2240   HGB 13.4 12/26/2023 0928   HCT 42.0 12/26/2023 0928   PLT 218 12/26/2023 0928   MCV 94 12/26/2023 0928   MCH 29.8 12/26/2023 0928   MCH 29.8 07/16/2022 2240   MCHC 31.9 12/26/2023 0928   MCHC 33.2 07/16/2022 2240   RDW 13.5 12/26/2023 0928   LYMPHSABS 0.9 07/16/2022 2240   MONOABS 0.9 07/16/2022 2240   EOSABS 0.1 07/16/2022 2240   BASOSABS 0.0 07/16/2022 2240    BMET    Component Value Date/Time   NA 144 12/26/2023 0928   K 3.8 12/26/2023 0928   CL 103 12/26/2023 0928   CO2 24 12/26/2023 0928   GLUCOSE 123 (H) 12/26/2023 0928   GLUCOSE 134 (H) 04/21/2023 0853   BUN 14 12/26/2023 0928   CREATININE 1.01 12/26/2023 0928   CALCIUM  9.1 12/26/2023 0928   GFRNONAA >60 04/21/2023 0853   GFRAA 99 06/08/2020 1003    BNP     Component Value Date/Time   BNP 515.9 (H) 12/26/2023 0928   BNP 192.6 (H) 08/12/2022 1056    ProBNP    Component Value Date/Time   PROBNP 189.0 (H) 10/04/2021 1617    Specialty Problems       Pulmonary Problems   Snoring   SOB (shortness of breath)   Pulmonary emphysema (HCC)   Non-seasonal allergic rhinitis    Allergies  Allergen Reactions   Atorvastatin      Muscle weakness and fatigue on 80mg  daily   Spironolactone      gynecomastia   Metformin  And Related Anxiety and Palpitations   Other Anxiety and Palpitations    Immunization History  Administered Date(s) Administered   Influenza,inj,Quad PF,6+ Mos 05/08/2019, 06/08/2020, 04/23/2021   PFIZER(Purple Top)SARS-COV-2 Vaccination 11/04/2019, 11/25/2019   PNEUMOCOCCAL CONJUGATE-20 04/23/2021   Pneumococcal Polysaccharide-23 06/05/2019   Tetanus 05/08/2019    Past Medical History:  Diagnosis Date   Cardiogenic shock (HCC)    CHF (congestive heart failure) (HCC)    Coronary artery disease    Diabetes mellitus without complication (HCC)    Hyperlipidemia    Medical history non-contributory     Tobacco History: Social History   Tobacco Use  Smoking Status Former   Current packs/day: 0.00   Types: Cigarettes   Quit date: 04/08/2019   Years since quitting: 5.1   Passive exposure: Current (wife smokes)  Smokeless Tobacco Never   Counseling given: Not Answered   Continue to not smoke  Outpatient Encounter Medications as of 05/29/2024  Medication Sig   Accu-Chek Softclix Lancets lancets Use to measure blood sugar twice a day   albuterol  (PROVENTIL ) (2.5 MG/3ML) 0.083% nebulizer solution Take 3 mLs (2.5 mg total) by nebulization every 6 (six) hours as needed for wheezing or shortness of breath.   albuterol  (VENTOLIN  HFA) 108 (90 Base) MCG/ACT inhaler INHALE 2 PUFFS BY MOUTH EVERY 6 HOURS AS NEEDED FOR WHEEZING FOR SHORTNESS OF BREATH   benzonatate  (TESSALON ) 200 MG capsule Take 1 capsule (200 mg total) by  mouth 3 (three) times daily as needed. For cough   bisoprolol  (ZEBETA ) 5 MG tablet Take 1 tablet (5 mg total) by mouth daily. NEEDS FOLLOW UP APPOINTMENT FOR MORE REFILLS   Blood Glucose Monitoring Suppl (ACCU-CHEK GUIDE ME) w/Device KIT 1 kit by Does not apply route in the morning and at bedtime. Use to measure blood sugar twice a day   chlorpheniramine  (CHLOR-TRIMETON ) 4 MG tablet 1 tab  tid prn runny nose   co-enzyme Q-10 30 MG capsule Take 1 capsule (30 mg total) by mouth daily.   empagliflozin  (JARDIANCE ) 10 MG TABS tablet Take 1 tablet (10 mg total) by mouth daily before breakfast.   eplerenone  (INSPRA ) 25 MG tablet Take 0.5 tablets (12.5 mg total) by mouth daily. PLEASE SCHEDULE APPOINTMENT FOR MORE REFILLS   Evolocumab  (REPATHA  SURECLICK) 140 MG/ML SOAJ Inject 140 mg into the skin every 14 (fourteen) days.   ezetimibe  (ZETIA ) 10 MG tablet Take 1 tablet (10 mg total) by mouth daily.   fluticasone  (FLONASE ) 50 MCG/ACT nasal spray USE 1 SPRAY IN EACH NOSTRIL ONCE DAILY AS NEEDED FOR ALLERGIES OR RHINITIS   furosemide  (LASIX ) 20 MG tablet Take 2 tablets (40 mg total) by mouth daily. (Patient taking differently: Take 20 mg by mouth daily. Takes one 20 mgh tablet daily)   glucose blood (ACCU-CHEK GUIDE) test strip Use to measure blood sugar twice a day   Hyprom-Naphaz-Polysorb-Zn Sulf (CLEAR EYES COMPLETE) SOLN Apply 1 drop to eye 3 (three) times daily.   loratadine  (CLARITIN ) 10 MG tablet Take 1 tablet by mouth once daily   melatonin 1 MG TABS tablet Take 1 tablet (1 mg total) by mouth at bedtime.   montelukast  (SINGULAIR ) 10 MG tablet TAKE ONE TABLET BY MOUTH DAILY AT BEDTIME   nitroGLYCERIN  (NITROSTAT ) 0.4 MG SL tablet Place 1 tablet (0.4 mg total) under the tongue every 5 (five) minutes x 3 doses as needed for chest pain.   omeprazole  (PRILOSEC) 20 MG capsule Take 1 capsule (20 mg total) by mouth daily.   potassium chloride  SA (KLOR-CON  M20) 20 MEQ tablet On the first day take 60meq (3  tablets) wait 4 hours then take another 60meq (3 tablets) by mouth. THEN THURSDAY 8/29 start 2 tablets by mouth 2 times daily.   pravastatin  (PRAVACHOL ) 80 MG tablet Take 1 tablet (80 mg total) by mouth every evening.   sacubitril -valsartan  (ENTRESTO ) 97-103 MG Take 1 tablet by mouth 2 (two) times daily.   ticagrelor  (BRILINTA ) 90 MG TABS tablet Take 1 tablet (90 mg total) by mouth 2 (two) times daily.   traZODone  (DESYREL ) 50 MG tablet TAKE HALF TABLET BY MOUTH AT BEDTIME   [DISCONTINUED] Budeson-Glycopyrrol-Formoterol  (BREZTRI  AEROSPHERE) 160-9-4.8 MCG/ACT AERO Inhale 2 puffs into the lungs 2 (two) times daily.   budesonide-glycopyrrolate-formoterol  (BREZTRI  AEROSPHERE) 160-9-4.8 MCG/ACT AERO inhaler Inhale 2 puffs into the lungs 2 (two) times daily.   No facility-administered encounter medications on file as of 05/29/2024.     Review of Systems  Review of Systems  N/a  Physical Exam  BP (!) 145/89   Pulse 63   Temp 97.8 F (36.6 C) (Oral)   Ht 5' 8 (1.727 m)   Wt 198 lb 9.6 oz (90.1 kg)   SpO2 96%   BMI 30.20 kg/m   Wt Readings from Last 5 Encounters:  05/29/24 198 lb 9.6 oz (90.1 kg)  05/02/24 196 lb 3.2 oz (89 kg)  12/26/23 198 lb (89.8 kg)  10/13/23 204 lb 12.8 oz (92.9 kg)  09/08/23 200 lb (90.7 kg)    BMI Readings from Last 5 Encounters:  05/29/24 30.20 kg/m  05/02/24 29.83 kg/m  12/26/23 30.11 kg/m  10/13/23 31.14 kg/m  09/08/23 30.41 kg/m     Physical Exam General: well appearing, in NAD Eyes: EOMI, no icterus  Neck: Supple, no JVP Cardiovascular: Regular rate and rhythm, normal Pulmonary: Clear to auscultation bilaterally, distant, normal work of breathing MSK: No synovitis, no joint effusion  Neuro: Normal gait, no weakness Psych: Normal mood, full affect   Assessment & Plan:   DOE: Likely component of cardiac cause given rising BNP and new left atrial dilation.  Improved with Dulera, worse when outdoors was argues for possible asthma.  At  risk for cigarette related lung disease as well.  PFTs confirm COPD as well as asthma with bronchodilator response.  Overall, improved with triple inhaled therapy via Breztri .  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  PFTs notable for significant bronchodilator response in addition to severe fixed obstruction.  Continue triple inhaled therapy via Breztri .  Refilled today.   Return in about 1 year (around 05/29/2025) for f/u Dr. Annella.   Stephen JONELLE Annella, MD 05/29/2024

## 2024-06-03 ENCOUNTER — Ambulatory Visit: Attending: Cardiology

## 2024-06-03 DIAGNOSIS — Z95810 Presence of automatic (implantable) cardiac defibrillator: Secondary | ICD-10-CM

## 2024-06-03 DIAGNOSIS — I5022 Chronic systolic (congestive) heart failure: Secondary | ICD-10-CM

## 2024-06-04 ENCOUNTER — Telehealth: Payer: Self-pay

## 2024-06-04 NOTE — Telephone Encounter (Signed)
 Remote ICM transmission received.  Attempted call to patient regarding ICM remote transmission and no answer.

## 2024-06-04 NOTE — Progress Notes (Signed)
 EPIC Encounter for ICM Monitoring  Patient Name: Stephen Jennings is a 68 y.o. male Date: 06/04/2024 Primary Care Physican: Vicci Barnie NOVAK, MD Primary Cardiologist: Harding/Bensimhon Electrophysiologist: [facility] 08/24/2022 Weight: 194 lbs (baseline 194-195) 12/26/2023 Office Weight: 198 lbs   04/23/2024 Weight: 195 lbs      05/02/2024 Office Visit: 196 lbs                                       Attempted call to patient and unable to reach.   Transmission results reviewed.    Diet:  N/A   CorVue thoracic impedance suggesting possible fluid accumulation starting 05/30/2024.   Prescribed:  Furosemide  20 mg take 2 tablet(s) (40 mg total) by mouth daily.   05/29/2024 epic note states taking 20 mg daily Potassium 20 mEq take 2 tablets (40 mEq total) by mouth twice a day   Labs: 12/26/2023 Creatinine 1.01, BUN 14, Potassium 3.8, Sodium 144, Gfr 82 10/13/2023 Creatinine 1.22, BUN 21, Potassium 4.7, Sodium 142, GFR 65 A complete set of results can be found in Results Review.   Recommendations:  Unable to reach.       Follow-up plan: ICM clinic phone appointment on 06/10/2024 to recheck fluid levels.   91 day device clinic remote transmission 07/17/2024.     EP/Cardiology Office Visits:  Recall 05/02/2025 with Dr Cherrie.   Recall 10/07/2024 with Jodie Passey, PA.   Copy of ICM check sent to Dr. Inocencio.   Remote monitoring is medically necessary for Heart Failure Management.    90 day Daily Thoracic Impedance ICM trend: 03/05/2024 through 06/03/2024.    12-14 Month Thoracic Impedance ICM trend:     Mitzie GORMAN Garner, RN 06/04/2024 8:31 AM

## 2024-06-06 ENCOUNTER — Other Ambulatory Visit: Payer: Self-pay | Admitting: Internal Medicine

## 2024-06-10 ENCOUNTER — Ambulatory Visit: Attending: Cardiology

## 2024-06-10 DIAGNOSIS — I5022 Chronic systolic (congestive) heart failure: Secondary | ICD-10-CM

## 2024-06-10 DIAGNOSIS — Z95810 Presence of automatic (implantable) cardiac defibrillator: Secondary | ICD-10-CM

## 2024-06-11 NOTE — Progress Notes (Signed)
 EPIC Encounter for ICM Monitoring  Patient Name: Stephen Jennings is a 68 y.o. male Date: 06/11/2024 Primary Care Physican: Vicci Barnie NOVAK, MD Primary Cardiologist: Harding/Bensimhon Electrophysiologist: [facility] 08/24/2022 Weight: 194 lbs (baseline 194-195) 12/26/2023 Office Weight: 198 lbs   04/23/2024 Weight: 195 lbs      05/02/2024 Office Visit: 196 lbs                                       Transmission results reviewed.    Diet:  N/A   Since 06/03/2024 ICM Remote Transmission: CorVue thoracic impedance suggesting fluid levels returned to normal.   Prescribed:  Furosemide  20 mg take 2 tablet(s) (40 mg total) by mouth daily.   05/29/2024 epic note states taking 20 mg daily Potassium 20 mEq take 2 tablets (40 mEq total) by mouth twice a day   Labs: 12/26/2023 Creatinine 1.01, BUN 14, Potassium 3.8, Sodium 144, Gfr 82 10/13/2023 Creatinine 1.22, BUN 21, Potassium 4.7, Sodium 142, GFR 65 A complete set of results can be found in Results Review.   Recommendations:  No changes.       Follow-up plan: ICM clinic phone appointment on 07/08/2024.   91 day device clinic remote transmission 07/17/2024.     EP/Cardiology Office Visits:  Recall 05/02/2025 with Dr Cherrie.   Recall 10/07/2024 with Jodie Passey, PA.   Copy of ICM check sent to Dr. Inocencio.   Remote monitoring is medically necessary for Heart Failure Management.    Daily Thoracic Impedance ICM trend: 03/13/2024 through 06/11/2024.    12-14 Month Thoracic Impedance ICM trend:     Mitzie GORMAN Garner, RN 06/11/2024 2:29 PM

## 2024-06-15 ENCOUNTER — Other Ambulatory Visit: Payer: Self-pay | Admitting: Internal Medicine

## 2024-06-15 DIAGNOSIS — J439 Emphysema, unspecified: Secondary | ICD-10-CM

## 2024-06-23 ENCOUNTER — Inpatient Hospital Stay (HOSPITAL_COMMUNITY)
Admission: EM | Admit: 2024-06-23 | Discharge: 2024-06-26 | DRG: 280 | Disposition: A | Discharge status: Home / Self Care | Attending: Internal Medicine | Admitting: Internal Medicine

## 2024-06-23 ENCOUNTER — Other Ambulatory Visit: Payer: Self-pay

## 2024-06-23 ENCOUNTER — Encounter (HOSPITAL_COMMUNITY): Payer: Self-pay

## 2024-06-23 ENCOUNTER — Emergency Department (HOSPITAL_COMMUNITY)

## 2024-06-23 DIAGNOSIS — Z79899 Other long term (current) drug therapy: Secondary | ICD-10-CM | POA: Diagnosis not present

## 2024-06-23 DIAGNOSIS — Z7984 Long term (current) use of oral hypoglycemic drugs: Secondary | ICD-10-CM | POA: Diagnosis not present

## 2024-06-23 DIAGNOSIS — N179 Acute kidney failure, unspecified: Secondary | ICD-10-CM | POA: Diagnosis present

## 2024-06-23 DIAGNOSIS — I5020 Unspecified systolic (congestive) heart failure: Secondary | ICD-10-CM | POA: Diagnosis not present

## 2024-06-23 DIAGNOSIS — N189 Chronic kidney disease, unspecified: Secondary | ICD-10-CM | POA: Diagnosis present

## 2024-06-23 DIAGNOSIS — E1165 Type 2 diabetes mellitus with hyperglycemia: Secondary | ICD-10-CM | POA: Diagnosis present

## 2024-06-23 DIAGNOSIS — I130 Hypertensive heart and chronic kidney disease with heart failure and stage 1 through stage 4 chronic kidney disease, or unspecified chronic kidney disease: Principal | ICD-10-CM | POA: Diagnosis present

## 2024-06-23 DIAGNOSIS — R7989 Other specified abnormal findings of blood chemistry: Secondary | ICD-10-CM

## 2024-06-23 DIAGNOSIS — Z955 Presence of coronary angioplasty implant and graft: Secondary | ICD-10-CM

## 2024-06-23 DIAGNOSIS — I428 Other cardiomyopathies: Secondary | ICD-10-CM | POA: Diagnosis present

## 2024-06-23 DIAGNOSIS — I2510 Atherosclerotic heart disease of native coronary artery without angina pectoris: Secondary | ICD-10-CM | POA: Diagnosis present

## 2024-06-23 DIAGNOSIS — Z7901 Long term (current) use of anticoagulants: Secondary | ICD-10-CM | POA: Diagnosis not present

## 2024-06-23 DIAGNOSIS — J189 Pneumonia, unspecified organism: Secondary | ICD-10-CM

## 2024-06-23 DIAGNOSIS — I21A1 Myocardial infarction type 2: Secondary | ICD-10-CM | POA: Diagnosis present

## 2024-06-23 DIAGNOSIS — J4489 Other specified chronic obstructive pulmonary disease: Secondary | ICD-10-CM | POA: Diagnosis present

## 2024-06-23 DIAGNOSIS — I214 Non-ST elevation (NSTEMI) myocardial infarction: Secondary | ICD-10-CM

## 2024-06-23 DIAGNOSIS — I5023 Acute on chronic systolic (congestive) heart failure: Secondary | ICD-10-CM | POA: Diagnosis present

## 2024-06-23 DIAGNOSIS — E872 Acidosis, unspecified: Secondary | ICD-10-CM | POA: Diagnosis present

## 2024-06-23 DIAGNOSIS — Z8679 Personal history of other diseases of the circulatory system: Secondary | ICD-10-CM | POA: Diagnosis not present

## 2024-06-23 DIAGNOSIS — Z7722 Contact with and (suspected) exposure to environmental tobacco smoke (acute) (chronic): Secondary | ICD-10-CM | POA: Diagnosis present

## 2024-06-23 DIAGNOSIS — I255 Ischemic cardiomyopathy: Secondary | ICD-10-CM | POA: Diagnosis present

## 2024-06-23 DIAGNOSIS — D72829 Elevated white blood cell count, unspecified: Secondary | ICD-10-CM | POA: Diagnosis present

## 2024-06-23 DIAGNOSIS — Z1152 Encounter for screening for COVID-19: Secondary | ICD-10-CM | POA: Diagnosis not present

## 2024-06-23 DIAGNOSIS — I252 Old myocardial infarction: Secondary | ICD-10-CM

## 2024-06-23 DIAGNOSIS — Z8249 Family history of ischemic heart disease and other diseases of the circulatory system: Secondary | ICD-10-CM | POA: Diagnosis not present

## 2024-06-23 DIAGNOSIS — I5043 Acute on chronic combined systolic (congestive) and diastolic (congestive) heart failure: Secondary | ICD-10-CM | POA: Diagnosis present

## 2024-06-23 DIAGNOSIS — J80 Acute respiratory distress syndrome: Secondary | ICD-10-CM | POA: Diagnosis present

## 2024-06-23 DIAGNOSIS — Z8674 Personal history of sudden cardiac arrest: Secondary | ICD-10-CM

## 2024-06-23 DIAGNOSIS — I50811 Acute right heart failure: Principal | ICD-10-CM

## 2024-06-23 DIAGNOSIS — Z888 Allergy status to other drugs, medicaments and biological substances status: Secondary | ICD-10-CM

## 2024-06-23 DIAGNOSIS — E1122 Type 2 diabetes mellitus with diabetic chronic kidney disease: Secondary | ICD-10-CM | POA: Diagnosis present

## 2024-06-23 DIAGNOSIS — Z87891 Personal history of nicotine dependence: Secondary | ICD-10-CM

## 2024-06-23 DIAGNOSIS — E785 Hyperlipidemia, unspecified: Secondary | ICD-10-CM | POA: Diagnosis present

## 2024-06-23 DIAGNOSIS — Z79890 Hormone replacement therapy (postmenopausal): Secondary | ICD-10-CM

## 2024-06-23 DIAGNOSIS — I5082 Biventricular heart failure: Secondary | ICD-10-CM | POA: Diagnosis present

## 2024-06-23 DIAGNOSIS — Z95810 Presence of automatic (implantable) cardiac defibrillator: Secondary | ICD-10-CM

## 2024-06-23 DIAGNOSIS — R0603 Acute respiratory distress: Secondary | ICD-10-CM

## 2024-06-23 DIAGNOSIS — Z7982 Long term (current) use of aspirin: Secondary | ICD-10-CM

## 2024-06-23 DIAGNOSIS — I509 Heart failure, unspecified: Secondary | ICD-10-CM | POA: Diagnosis not present

## 2024-06-23 DIAGNOSIS — Z555 Less than a high school diploma: Secondary | ICD-10-CM

## 2024-06-23 LAB — RESPIRATORY PANEL BY PCR

## 2024-06-23 LAB — I-STAT ARTERIAL BLOOD GAS, ED
Acid-base deficit: 3 mmol/L — ABNORMAL HIGH (ref 0.0–2.0)
Bicarbonate: 22.7 mmol/L (ref 20.0–28.0)
Calcium, Ion: 1.2 mmol/L (ref 1.15–1.40)
HCT: 45 % (ref 39.0–52.0)
Hemoglobin: 15.3 g/dL (ref 13.0–17.0)
O2 Saturation: 95 %
Patient temperature: 97.9
Potassium: 4.2 mmol/L (ref 3.5–5.1)
Sodium: 141 mmol/L (ref 135–145)
TCO2: 24 mmol/L (ref 22–32)
pCO2 arterial: 42 mmHg (ref 32–48)
pH, Arterial: 7.338 — ABNORMAL LOW (ref 7.35–7.45)
pO2, Arterial: 80 mmHg — ABNORMAL LOW (ref 83–108)

## 2024-06-23 LAB — GLUCOSE, CAPILLARY
Glucose-Capillary: 145 mg/dL — ABNORMAL HIGH (ref 70–99)
Glucose-Capillary: 141 mg/dL — ABNORMAL HIGH (ref 70–99)

## 2024-06-23 LAB — CBC
HCT: 45.3 % (ref 39.0–52.0)
Hemoglobin: 14.2 g/dL (ref 13.0–17.0)
MCH: 29.7 pg (ref 26.0–34.0)
MCHC: 31.3 g/dL (ref 30.0–36.0)
MCV: 94.8 fL (ref 80.0–100.0)
Platelets: 415 K/uL — ABNORMAL HIGH (ref 150–400)
RBC: 4.78 MIL/uL (ref 4.22–5.81)
RDW: 13.9 % (ref 11.5–15.5)
WBC: 22.3 K/uL — ABNORMAL HIGH (ref 4.0–10.5)
nRBC: 0 % (ref 0.0–0.2)

## 2024-06-23 LAB — HEPARIN LEVEL (UNFRACTIONATED): Heparin Unfractionated: 0.14 [IU]/mL — ABNORMAL LOW (ref 0.30–0.70)

## 2024-06-23 LAB — HEMOGLOBIN A1C
Hgb A1c MFr Bld: 5.5 % (ref 4.8–5.6)
Mean Plasma Glucose: 111.15 mg/dL

## 2024-06-23 LAB — PROCALCITONIN: Procalcitonin: 1.46 ng/mL

## 2024-06-23 LAB — RESP PANEL BY RT-PCR (RSV, FLU A&B, COVID)  RVPGX2
Influenza A by PCR: NEGATIVE
Influenza B by PCR: NEGATIVE
Resp Syncytial Virus by PCR: NEGATIVE
SARS Coronavirus 2 by RT PCR: NEGATIVE

## 2024-06-23 LAB — BASIC METABOLIC PANEL WITH GFR
Anion gap: 11 (ref 5–15)
BUN: 16 mg/dL (ref 8–23)
CO2: 23 mmol/L (ref 22–32)
Calcium: 8.7 mg/dL — ABNORMAL LOW (ref 8.9–10.3)
Chloride: 104 mmol/L (ref 98–111)
Creatinine, Ser: 1.34 mg/dL — ABNORMAL HIGH (ref 0.61–1.24)
GFR, Estimated: 58 mL/min — ABNORMAL LOW (ref >60)
Glucose, Bld: 360 mg/dL — ABNORMAL HIGH (ref 70–99)
Potassium: 4.4 mmol/L (ref 3.5–5.1)
Sodium: 138 mmol/L (ref 135–145)

## 2024-06-23 LAB — HIV ANTIBODY (ROUTINE TESTING W REFLEX): HIV Screen 4th Generation wRfx: NONREACTIVE

## 2024-06-23 LAB — PHOSPHORUS: Phosphorus: 2 mg/dL — ABNORMAL LOW (ref 2.5–4.6)

## 2024-06-23 LAB — CBG MONITORING, ED
Glucose-Capillary: 174 mg/dL — ABNORMAL HIGH (ref 70–99)
Glucose-Capillary: 167 mg/dL — ABNORMAL HIGH (ref 70–99)

## 2024-06-23 LAB — TROPONIN I (HIGH SENSITIVITY)
Troponin I (High Sensitivity): 138 ng/L (ref <18)
Troponin I (High Sensitivity): 1241 ng/L (ref <18)
Troponin I (High Sensitivity): 5061 ng/L (ref <18)

## 2024-06-23 LAB — LACTIC ACID, PLASMA
Lactic Acid, Venous: 3 mmol/L (ref 0.5–1.9)
Lactic Acid, Venous: 3 mmol/L (ref 0.5–1.9)

## 2024-06-23 LAB — STREP PNEUMONIAE URINARY ANTIGEN: Strep Pneumo Urinary Antigen: NEGATIVE

## 2024-06-23 LAB — MRSA NEXT GEN BY PCR, NASAL: MRSA by PCR Next Gen: NOT DETECTED

## 2024-06-23 LAB — BRAIN NATRIURETIC PEPTIDE: B Natriuretic Peptide: 1604.7 pg/mL — ABNORMAL HIGH (ref 0.0–100.0)

## 2024-06-23 LAB — MAGNESIUM: Magnesium: 2.3 mg/dL (ref 1.7–2.4)

## 2024-06-23 MED ORDER — SODIUM CHLORIDE 0.9 % IV SOLN
2.0000 g | Freq: Two times a day (BID) | INTRAVENOUS | Status: DC
  Administered 2024-06-23 – 2024-06-25 (×5): 2 g via INTRAVENOUS
  Filled 2024-06-23 (×5): qty 12.5

## 2024-06-23 MED ORDER — FUROSEMIDE 10 MG/ML IJ SOLN
80.0000 mg | Freq: Two times a day (BID) | INTRAMUSCULAR | Status: DC
  Administered 2024-06-23 – 2024-06-26 (×6): 80 mg via INTRAVENOUS
  Filled 2024-06-23 (×6): qty 8

## 2024-06-23 MED ORDER — SODIUM CHLORIDE 0.9 % IV SOLN
500.0000 mg | INTRAVENOUS | Status: DC
  Administered 2024-06-24 – 2024-06-25 (×2): 500 mg via INTRAVENOUS
  Filled 2024-06-23 (×3): qty 5

## 2024-06-23 MED ORDER — ASPIRIN 81 MG PO CHEW
324.0000 mg | CHEWABLE_TABLET | Freq: Once | ORAL | Status: AC
  Administered 2024-06-23: 324 mg via ORAL
  Filled 2024-06-23: qty 4

## 2024-06-23 MED ORDER — ASPIRIN 81 MG PO TBEC
81.0000 mg | DELAYED_RELEASE_TABLET | Freq: Every day | ORAL | Status: DC
  Administered 2024-06-24: 81 mg via ORAL
  Filled 2024-06-23: qty 1

## 2024-06-23 MED ORDER — PROCHLORPERAZINE EDISYLATE 10 MG/2ML IJ SOLN: 5.0000 mg | Freq: Four times a day (QID) | INTRAMUSCULAR | Status: DC | PRN

## 2024-06-23 MED ORDER — K PHOS MONO-SOD PHOS DI & MONO 155-852-130 MG PO TABS
500.0000 mg | ORAL_TABLET | Freq: Four times a day (QID) | ORAL | Status: AC
  Administered 2024-06-23 (×2): 500 mg via ORAL
  Filled 2024-06-23 (×2): qty 2

## 2024-06-23 MED ORDER — INSULIN GLARGINE-YFGN 100 UNIT/ML ~~LOC~~ SOLN
5.0000 [IU] | Freq: Every day | SUBCUTANEOUS | Status: DC
  Administered 2024-06-23 – 2024-06-26 (×4): 5 [IU] via SUBCUTANEOUS
  Filled 2024-06-23 (×4): qty 0.05

## 2024-06-23 MED ORDER — SODIUM CHLORIDE 0.9 % IV SOLN
1.0000 g | Freq: Once | INTRAVENOUS | Status: DC
  Administered 2024-06-23: 1 g via INTRAVENOUS
  Filled 2024-06-23: qty 10

## 2024-06-23 MED ORDER — MONTELUKAST SODIUM 10 MG PO TABS
10.0000 mg | ORAL_TABLET | Freq: Every day | ORAL | Status: DC
  Administered 2024-06-23 – 2024-06-25 (×3): 10 mg via ORAL
  Filled 2024-06-23 (×3): qty 1

## 2024-06-23 MED ORDER — EZETIMIBE 10 MG PO TABS
10.0000 mg | ORAL_TABLET | Freq: Every day | ORAL | Status: DC
  Administered 2024-06-24 – 2024-06-26 (×3): 10 mg via ORAL
  Filled 2024-06-23 (×4): qty 1

## 2024-06-23 MED ORDER — INSULIN ASPART 100 UNIT/ML IJ SOLN
0.0000 [IU] | INTRAMUSCULAR | Status: DC
  Administered 2024-06-23 (×2): 1 [IU] via SUBCUTANEOUS
  Administered 2024-06-23 (×2): 2 [IU] via SUBCUTANEOUS
  Administered 2024-06-24 – 2024-06-25 (×4): 1 [IU] via SUBCUTANEOUS
  Filled 2024-06-23: qty 1

## 2024-06-23 MED ORDER — TICAGRELOR 90 MG PO TABS
90.0000 mg | ORAL_TABLET | Freq: Two times a day (BID) | ORAL | Status: DC
  Administered 2024-06-23 – 2024-06-26 (×7): 90 mg via ORAL
  Filled 2024-06-23 (×7): qty 1

## 2024-06-23 MED ORDER — NITROGLYCERIN 0.4 MG SL SUBL
0.4000 mg | SUBLINGUAL_TABLET | SUBLINGUAL | Status: DC | PRN
Start: 2024-06-23 — End: 2024-06-26

## 2024-06-23 MED ORDER — POLYETHYLENE GLYCOL 3350 17 G PO PACK: 17.0000 g | PACK | Freq: Every day | ORAL | Status: DC | PRN

## 2024-06-23 MED ORDER — ENOXAPARIN SODIUM 40 MG/0.4ML IJ SOSY: 40.0000 mg | PREFILLED_SYRINGE | Freq: Every day | INTRAMUSCULAR | Status: DC

## 2024-06-23 MED ORDER — INSULIN ASPART 100 UNIT/ML IJ SOLN: 0.0000 [IU] | Freq: Every day | INTRAMUSCULAR | Status: DC

## 2024-06-23 MED ORDER — HEPARIN (PORCINE) 25000 UT/250ML-% IV SOLN
1600.0000 [IU]/h | INTRAVENOUS | Status: DC
  Administered 2024-06-23: 1100 [IU]/h via INTRAVENOUS
  Administered 2024-06-24: 1350 [IU]/h via INTRAVENOUS
  Administered 2024-06-24 (×2): 1600 [IU]/h via INTRAVENOUS
  Filled 2024-06-23 (×4): qty 250

## 2024-06-23 MED ORDER — BUDESON-GLYCOPYRROL-FORMOTEROL 160-9-4.8 MCG/ACT IN AERO
2.0000 | INHALATION_SPRAY | Freq: Two times a day (BID) | RESPIRATORY_TRACT | Status: DC
Start: 2024-06-23 — End: 2024-06-26
  Administered 2024-06-23 – 2024-06-26 (×7): 2 via RESPIRATORY_TRACT
  Filled 2024-06-23: qty 5.9

## 2024-06-23 MED ORDER — IPRATROPIUM-ALBUTEROL 0.5-2.5 (3) MG/3ML IN SOLN
3.0000 mL | Freq: Four times a day (QID) | RESPIRATORY_TRACT | Status: DC | PRN
Start: 2024-06-23 — End: 2024-06-26

## 2024-06-23 MED ORDER — FUROSEMIDE 10 MG/ML IJ SOLN
40.0000 mg | Freq: Two times a day (BID) | INTRAMUSCULAR | Status: DC
  Administered 2024-06-23: 40 mg via INTRAVENOUS
  Filled 2024-06-23: qty 4

## 2024-06-23 MED ORDER — SODIUM CHLORIDE 0.9 % IV SOLN
500.0000 mg | Freq: Once | INTRAVENOUS | Status: AC
  Administered 2024-06-23: 500 mg via INTRAVENOUS
  Filled 2024-06-23: qty 5

## 2024-06-23 MED ORDER — PRAVASTATIN SODIUM 40 MG PO TABS
80.0000 mg | ORAL_TABLET | Freq: Every evening | ORAL | Status: DC
  Filled 2024-06-23: qty 2

## 2024-06-23 MED ORDER — INSULIN ASPART 100 UNIT/ML IJ SOLN: 0.0000 [IU] | Freq: Three times a day (TID) | INTRAMUSCULAR | Status: DC

## 2024-06-23 MED ORDER — FUROSEMIDE 10 MG/ML IJ SOLN
40.0000 mg | Freq: Once | INTRAMUSCULAR | Status: AC
  Administered 2024-06-23: 40 mg via INTRAVENOUS
  Filled 2024-06-23: qty 4

## 2024-06-23 MED ORDER — HEPARIN BOLUS VIA INFUSION
4000.0000 [IU] | Freq: Once | INTRAVENOUS | Status: AC
  Administered 2024-06-23: 4000 [IU] via INTRAVENOUS
  Filled 2024-06-23: qty 4000

## 2024-06-23 MED ORDER — MELATONIN 5 MG PO TABS
5.0000 mg | ORAL_TABLET | Freq: Every evening | ORAL | Status: DC | PRN
  Filled 2024-06-23: qty 1

## 2024-06-23 MED ORDER — ACETAMINOPHEN 500 MG PO TABS
500.0000 mg | ORAL_TABLET | Freq: Four times a day (QID) | ORAL | Status: DC | PRN
  Administered 2024-06-23: 500 mg via ORAL
  Filled 2024-06-23: qty 1

## 2024-06-23 NOTE — ED Provider Notes (Signed)
 [place] EMERGENCY DEPARTMENT AT [facility] Provider Note   CSN: [id number] Arrival date & time: 06/23/24  9640     Patient presents with: Shortness of Breath   Stephen Jennings is a 68 y.o. male.   The history is provided by the EMS personnel and the patient. The history is limited by the condition of the patient.  Shortness of Breath Severity:  Severe Onset quality:  Sudden Timing:  Constant Progression:  Unchanged Chronicity:  New Context: not URI   Relieved by:  Nothing Worsened by:  Nothing Ineffective treatments:  None tried Associated symptoms: wheezing   Associated symptoms: no fever, no sore throat, no sputum production, no syncope, no swollen glands and no vomiting   Risk factors: no recent surgery   Patient with COPD and CHF with sudden onset SOB.  Given steroids and magnesium  by EMS without relief.      Past Medical History:  Diagnosis Date   Cardiogenic shock (HCC)    CHF (congestive heart failure) (HCC)    Coronary artery disease    Diabetes mellitus without complication (HCC)    Hyperlipidemia    Medical history non-contributory      Prior to Admission medications   Medication Sig Start Date End Date Taking? Authorizing Provider  Accu-Chek Softclix Lancets lancets Use to measure blood sugar twice a day 11/06/19   Vicci Barnie NOVAK, MD  albuterol  (PROVENTIL ) (2.5 MG/3ML) 0.083% nebulizer solution Take 3 mLs (2.5 mg total) by nebulization every 6 (six) hours as needed for wheezing or shortness of breath. 08/23/21   Doretha Folks, MD  albuterol  (VENTOLIN  HFA) 108 (90 Base) MCG/ACT inhaler INHALE 2 PUFFS BY MOUTH EVERY 6 HOURS AS NEEDED FOR WHEEZING FOR SHORTNESS OF BREATH 08/31/23   Hunsucker, Donnice SAUNDERS, MD  benzonatate  (TESSALON ) 200 MG capsule Take 1 capsule (200 mg total) by mouth 3 (three) times daily as needed. For cough 02/03/23   Hunsucker, Donnice SAUNDERS, MD  bisoprolol  (ZEBETA ) 5 MG tablet Take 1 tablet (5 mg total) by mouth daily. NEEDS FOLLOW  UP APPOINTMENT FOR MORE REFILLS 09/13/23   Glena Harlene HERO, [name]  Blood Glucose Monitoring Suppl (ACCU-CHEK GUIDE ME) w/Device KIT 1 kit by Does not apply route in the morning and at bedtime. Use to measure blood sugar twice a day 11/06/19   Vicci Barnie NOVAK, MD  budesonide-glycopyrrolate-formoterol  (BREZTRI  AEROSPHERE) 160-9-4.8 MCG/ACT AERO inhaler Inhale 2 puffs into the lungs 2 (two) times daily. 05/29/24   Hunsucker, Donnice SAUNDERS, MD  chlorpheniramine  (CHLOR-TRIMETON ) 4 MG tablet 1 tab tid prn runny nose 08/17/22   Danton Jon HERO, [name]  co-enzyme Q-10 30 MG capsule Take 1 capsule (30 mg total) by mouth daily. 05/25/21   Milford, Harlene HERO, [name]  eplerenone  (INSPRA ) 25 MG tablet Take 0.5 tablets (12.5 mg total) by mouth daily. PLEASE SCHEDULE APPOINTMENT FOR MORE REFILLS 03/21/24   Glena Harlene HERO, [name]  Evolocumab  (REPATHA  SURECLICK) 140 MG/ML SOAJ Inject 140 mg into the skin every 14 (fourteen) days. 03/18/24   Camnitz, Soyla Lunger, MD  ezetimibe  (ZETIA ) 10 MG tablet Take 1 tablet (10 mg total) by mouth daily. 09/08/23   Vicci Barnie NOVAK, MD  fluticasone  (FLONASE ) 50 MCG/ACT nasal spray USE 1 SPRAY IN EACH NOSTRIL ONCE DAILY AS NEEDED FOR ALLERGIES OR RHINITIS 08/18/23   Vicci Barnie NOVAK, MD  furosemide  (LASIX ) 20 MG tablet Take 2 tablets (40 mg total) by mouth daily. Patient taking differently: Take 20 mg by mouth daily. Takes one 20 mgh tablet daily  09/04/23   Glena Harlene HERO, [name]  glucose blood (ACCU-CHEK GUIDE) test strip Use to measure blood sugar twice a day 11/06/19   Vicci Barnie NOVAK, MD  Hyprom-Naphaz-Polysorb-Zn Sulf (CLEAR EYES COMPLETE) SOLN Apply 1 drop to eye 3 (three) times daily. 05/08/19   Brien Belvie BRAVO, MD  JARDIANCE  10 MG TABS tablet Take 1 tablet (10 mg total) by mouth daily. Must have office visit for refills 06/06/24   Vicci Barnie NOVAK, MD  loratadine  (CLARITIN ) 10 MG tablet Take 1 tablet by mouth once daily 05/23/23   Hunsucker, Donnice SAUNDERS, MD  melatonin 1 MG TABS  tablet Take 1 tablet (1 mg total) by mouth at bedtime. 01/02/23   Vicci Barnie NOVAK, MD  montelukast  (SINGULAIR ) 10 MG tablet TAKE ONE TABLET BY MOUTH DAILY AT BEDTIME 05/15/24   Vicci Barnie NOVAK, MD  nitroGLYCERIN  (NITROSTAT ) 0.4 MG SL tablet Place 1 tablet (0.4 mg total) under the tongue every 5 (five) minutes x 3 doses as needed for chest pain. 02/06/21   Duke, Jon Garre, PA  omeprazole  (PRILOSEC) 20 MG capsule Take 1 capsule (20 mg total) by mouth daily. 09/28/22   Vicci Barnie NOVAK, MD  potassium chloride  SA (KLOR-CON  M20) 20 MEQ tablet On the first day take 60meq (3 tablets) wait 4 hours then take another 60meq (3 tablets) by mouth. THEN THURSDAY 8/29 start 2 tablets by mouth 2 times daily. 04/19/23   Clegg, Amy D, NP  pravastatin  (PRAVACHOL ) 80 MG tablet Take 1 tablet (80 mg total) by mouth every evening. 09/08/23   Vicci Barnie NOVAK, MD  sacubitril -valsartan  (ENTRESTO ) 97-103 MG Take 1 tablet by mouth 2 (two) times daily. 05/01/23   Clegg, Amy D, NP  ticagrelor  (BRILINTA ) 90 MG TABS tablet Take 1 tablet (90 mg total) by mouth 2 (two) times daily. 04/17/23   Bensimhon, Toribio SAUNDERS, MD  traZODone  (DESYREL ) 50 MG tablet TAKE HALF TABLET BY MOUTH AT BEDTIME 03/18/24   Vicci Barnie NOVAK, MD    Allergies: Atorvastatin , Spironolactone , Metformin  and related, and Other    Review of Systems  Constitutional:  Negative for fever.  HENT:  Negative for sore throat.   Respiratory:  Positive for shortness of breath and wheezing. Negative for sputum production and stridor.   Cardiovascular:  Negative for syncope.  Gastrointestinal:  Negative for vomiting.  All other systems reviewed and are negative.   Updated Vital Signs BP 125/82   Pulse (!) 101   Temp (!) 97.2 F (36.2 C) (Axillary)   Resp (!) 26   SpO2 100%   Physical Exam Vitals and nursing note reviewed.  Constitutional:      General: He is in acute distress.     Appearance: Normal appearance. He is well-developed. He is not diaphoretic.   HENT:     Head: Normocephalic and atraumatic.     Nose: Nose normal.  Eyes:     Conjunctiva/sclera: Conjunctivae normal.     Pupils: Pupils are equal, round, and reactive to light.  Cardiovascular:     Rate and Rhythm: Regular rhythm. Tachycardia present.     Pulses: Normal pulses.     Heart sounds: Normal heart sounds.  Pulmonary:     Breath sounds: Wheezing, rhonchi and rales present.  Abdominal:     General: Bowel sounds are normal.     Palpations: Abdomen is soft.     Tenderness: There is no abdominal tenderness. There is no guarding or rebound.  Musculoskeletal:        General: Normal  range of motion.     Cervical back: Normal range of motion and neck supple.  Skin:    General: Skin is warm and dry.     Capillary Refill: Capillary refill takes less than 2 seconds.  Neurological:     General: No focal deficit present.     Mental Status: He is alert and oriented to person, place, and time.     Deep Tendon Reflexes: Reflexes normal.     (all labs ordered are listed, but only abnormal results are displayed) Results for orders placed or performed during the hospital encounter of 06/23/24  Basic metabolic panel   Collection Time: 06/23/24  4:01 AM  Result Value Ref Range   Sodium 138 135 - 145 mmol/L   Potassium 4.4 3.5 - 5.1 mmol/L   Chloride 104 98 - 111 mmol/L   CO2 23 22 - 32 mmol/L   Glucose, Bld 360 (H) 70 - 99 mg/dL   BUN 16 8 - 23 mg/dL   Creatinine, Ser 8.65 (H) 0.61 - 1.24 mg/dL   Calcium  8.7 (L) 8.9 - 10.3 mg/dL   GFR, Estimated 58 (L) >60 mL/min   Anion gap 11 5 - 15  CBC   Collection Time: 06/23/24  4:01 AM  Result Value Ref Range   WBC 22.3 (H) 4.0 - 10.5 K/uL   RBC 4.78 4.22 - 5.81 MIL/uL   Hemoglobin 14.2 13.0 - 17.0 g/dL   HCT 54.6 60.9 - 47.9 %   MCV 94.8 80.0 - 100.0 fL   MCH 29.7 26.0 - 34.0 pg   MCHC 31.3 30.0 - 36.0 g/dL   RDW 86.0 88.4 - 84.4 %   Platelets 415 (H) 150 - 400 K/uL   nRBC 0.0 0.0 - 0.2 %  Brain natriuretic peptide    Collection Time: 06/23/24  4:01 AM  Result Value Ref Range   B Natriuretic Peptide 1,604.7 (H) 0.0 - 100.0 pg/mL  Troponin I (High Sensitivity)   Collection Time: 06/23/24  4:01 AM  Result Value Ref Range   Troponin I (High Sensitivity) 138 (HH) <18 ng/L   DG Chest Portable 1 View Result Date: 06/23/2024 CLINICAL DATA:  Initial evaluation for acute shortness of breath. EXAM: PORTABLE CHEST 1 VIEW COMPARISON:  Prior radiograph from 07/16/2022. FINDINGS: Diffuse related pad overlies the right chest. Left-sided dual lead transvenous pacemaker/AICD in place with electrodes overlying the right atrium and right ventricle. Transverse heart size at the upper limits of normal. Mediastinal silhouette within normal limits. Lungs normally inflated. Perihilar vascular congestion with diffuse interstitial prominence, consistent with a degree of pulmonary interstitial edema. Superimposed patchy consolidative opacity at the right lower lobe, concerning for pneumonia. Probable trace right pleural effusion. No pneumothorax. Visualized osseous structures and soft tissues demonstrate no acute finding. IMPRESSION: 1. Patchy consolidative right lower lobe opacity, concerning for pneumonia. 2. Underlying diffuse interstitial prominence, consistent with pulmonary interstitial edema. 3. Probable trace right pleural effusion. Electronically Signed   By: Morene Hoard M.D.   On: 06/23/2024 04:47     EKG: EKG Interpretation Date/Time:  Sunday June 23 2024 04:03:39 EST Ventricular Rate:  121 PR Interval:  136 QRS Duration:  132 QT Interval:  324 QTC Calculation: 460 R Axis:   -65  Text Interpretation: Sinus tachycardia Probable left atrial enlargement Nonspecific IVCD with LAD Confirmed by Nettie, Kelcie Currie ([id number]) on 06/23/2024 5:35:08 AM  Radiology: [facility] Chest Portable 1 View Result Date: 06/23/2024 CLINICAL DATA:  Initial evaluation for acute shortness of breath. EXAM: PORTABLE CHEST  1 VIEW COMPARISON:  Prior  radiograph from 07/16/2022. FINDINGS: Diffuse related pad overlies the right chest. Left-sided dual lead transvenous pacemaker/AICD in place with electrodes overlying the right atrium and right ventricle. Transverse heart size at the upper limits of normal. Mediastinal silhouette within normal limits. Lungs normally inflated. Perihilar vascular congestion with diffuse interstitial prominence, consistent with a degree of pulmonary interstitial edema. Superimposed patchy consolidative opacity at the right lower lobe, concerning for pneumonia. Probable trace right pleural effusion. No pneumothorax. Visualized osseous structures and soft tissues demonstrate no acute finding. IMPRESSION: 1. Patchy consolidative right lower lobe opacity, concerning for pneumonia. 2. Underlying diffuse interstitial prominence, consistent with pulmonary interstitial edema. 3. Probable trace right pleural effusion. Electronically Signed   By: Morene Hoard M.D.   On: 06/23/2024 04:47     .Critical Care  Performed by: Nettie Earing, MD Authorized by: Nettie Earing, MD   Critical care provider statement:    Critical care time (minutes):  60   Critical care end time:  06/23/2024 5:56 AM   Critical care was necessary to treat or prevent imminent or life-threatening deterioration of the following conditions:  Cardiac failure and respiratory failure   Critical care was time spent personally by me on the following activities:  Development of treatment plan with patient or surrogate, discussions with consultants, evaluation of patient's response to treatment, examination of patient, ordering and review of laboratory studies, ordering and review of radiographic studies, ordering and performing treatments and interventions, pulse oximetry, re-evaluation of patient's condition and review of old charts   I assumed direction of critical care for this patient from another provider in my specialty: no     Care discussed with:  admitting provider   Comments:     Bipap and complexity of care     Medications Ordered in the ED  cefTRIAXone (ROCEPHIN) 1 g in sodium chloride  0.9 % 100 mL IVPB (has no administration in time range)  azithromycin  (ZITHROMAX ) 500 mg in sodium chloride  0.9 % 250 mL IVPB (has no administration in time range)  enoxaparin (LOVENOX) injection 40 mg (has no administration in time range)  insulin  aspart (novoLOG ) injection 0-15 Units (has no administration in time range)  insulin  aspart (novoLOG ) injection 0-5 Units (has no administration in time range)  furosemide  (LASIX ) injection 40 mg (40 mg Intravenous Given 06/23/24 0412)                                    Medical Decision Making Patient with 2 hours of SOB  Amount and/or Complexity of Data Reviewed Independent Historian: EMS    Details: See above  External Data Reviewed: notes.    Details: Previous notes reviewed  Labs: ordered.    Details: Elevated troponin 138, BNP elevated 1604.7, elevated white count 22.7, normal hemoglobin 14.7, normal platelets.  Normal sodium 138, normal potassium 4.4, normal creatinine  Radiology: ordered. ECG/medicine tests: ordered and independent interpretation performed. Decision-making details documented in ED Course.  Risk Prescription drug management. Decision regarding hospitalization.     Final diagnoses:  Acute right-sided congestive heart failure (HCC)  Respiratory distress  Elevated troponin   The patient appears reasonably stabilized for admission considering the current resources, flow, and capabilities available in the ED at this time, and I doubt any other [facility] requiring further screening and/or treatment in the ED prior to admission.  ED Discharge Orders     None  Yeng Frankie, MD 06/23/24 [phone]

## 2024-06-23 NOTE — Plan of Care (Signed)
 Patient was seen and examined at bedside. Patient was admitted today early morning due to hypoxic respiratory failure. Community-acquired pneumonia, continue current antibiotics.  Continue Breztri  inhaler and DuoNeb as needed. Follow cultures  NSTEMI: Started heparin  IV infusion, pharmacy consulted for dosing and PTT monitoring.  Aspirin  324 mg x 1 dose given, started aspirin  81 mg p.o. daily and nitroglycerin  as needed.  Patient denies any chest pain. Cardiology consulted, recommended no need of cardiac cath at this time, diet resumed.  Follow cardiology for further recommendation. Follow TTE  Acute hypoxic respiratory failure, with pulmonary edema, elevated BNP most likely CHF exacerbation.  Started Lasix  40 mg IV twice daily, x 2 doses given.  Reeval hydration status tomorrow a.m. monitor renal functions and urine output daily.  Continue rest of the same treatment. No charge note, same-day admission.

## 2024-06-23 NOTE — ED Notes (Signed)
 Notified Dr. Von of pt's trop 1,241 and lactic acid of 3.0.

## 2024-06-23 NOTE — ED Triage Notes (Signed)
 Hx of COPD. Called for SOB, 2 duonebs en route, placed on CPAP and has another neb in process. 2g Mg and 125mg  solumedrol given en route. SOB started 2 hrs ago. Elevation in V3 and V4 on EKG.  80% initially. Low 90s on CPAP. Pt labored and dyspneic.

## 2024-06-23 NOTE — Progress Notes (Signed)
 PHARMACY - ANTICOAGULATION CONSULT NOTE  Pharmacy Consult for Heparin  Indication: chest pain/ACS  Allergies  Allergen Reactions   Atorvastatin      Muscle weakness and fatigue on 80mg  daily   Spironolactone      gynecomastia   Metformin  And Related Anxiety and Palpitations   Other Anxiety and Palpitations    Patient Measurements:    Vital Signs: Temp: 98.1 F (36.7 C) (11/02 0750) Temp Source: Oral (11/02 0750) BP: 106/79 (11/02 0750) Pulse Rate: 86 (11/02 0750)  Labs: Recent Labs    06/23/24 0401 06/23/24 0548 06/23/24 0608  HGB 14.2  --  15.3  HCT 45.3  --  45.0  PLT 415*  --   --   CREATININE 1.34*  --   --   TROPONINIHS 138* 1,241*  --     CrCl cannot be calculated (Unknown ideal weight.).   Medical History: Past Medical History:  Diagnosis Date   Cardiogenic shock (HCC)    CHF (congestive heart failure) (HCC)    Coronary artery disease    Diabetes mellitus without complication (HCC)    Hyperlipidemia    Medical history non-contributory     Medications:  No current facility-administered medications on file prior to encounter.   Current Outpatient Medications on File Prior to Encounter  Medication Sig Dispense Refill   albuterol  (PROVENTIL ) (2.5 MG/3ML) 0.083% nebulizer solution Take 3 mLs (2.5 mg total) by nebulization every 6 (six) hours as needed for wheezing or shortness of breath. 75 mL 12   albuterol  (VENTOLIN  HFA) 108 (90 Base) MCG/ACT inhaler INHALE 2 PUFFS BY MOUTH EVERY 6 HOURS AS NEEDED FOR WHEEZING FOR SHORTNESS OF BREATH 18 g 0   benzonatate  (TESSALON ) 200 MG capsule Take 1 capsule (200 mg total) by mouth 3 (three) times daily as needed. For cough 30 capsule 6   bisoprolol  (ZEBETA ) 5 MG tablet Take 1 tablet (5 mg total) by mouth daily. NEEDS FOLLOW UP APPOINTMENT FOR MORE REFILLS 30 tablet 0   budesonide-glycopyrrolate-formoterol  (BREZTRI  AEROSPHERE) 160-9-4.8 MCG/ACT AERO inhaler Inhale 2 puffs into the lungs 2 (two) times daily. 3 each 4    chlorpheniramine  (CHLOR-TRIMETON ) 4 MG tablet 1 tab tid prn runny nose 40 tablet 0   co-enzyme Q-10 30 MG capsule Take 1 capsule (30 mg total) by mouth daily. 90 capsule 3   eplerenone  (INSPRA ) 25 MG tablet Take 0.5 tablets (12.5 mg total) by mouth daily. PLEASE SCHEDULE APPOINTMENT FOR MORE REFILLS 45 tablet 0   Evolocumab  (REPATHA  SURECLICK) 140 MG/ML SOAJ Inject 140 mg into the skin every 14 (fourteen) days. 6 mL 3   ezetimibe  (ZETIA ) 10 MG tablet Take 1 tablet (10 mg total) by mouth daily. 90 tablet 3   fluticasone  (FLONASE ) 50 MCG/ACT nasal spray USE 1 SPRAY IN EACH NOSTRIL ONCE DAILY AS NEEDED FOR ALLERGIES OR RHINITIS 48 g 0   furosemide  (LASIX ) 20 MG tablet Take 2 tablets (40 mg total) by mouth daily. (Patient taking differently: Take 20 mg by mouth daily. Takes one 20 mgh tablet daily) 180 tablet 3   Hyprom-Naphaz-Polysorb-Zn Sulf (CLEAR EYES COMPLETE) SOLN Apply 1 drop to eye 3 (three) times daily. 15 mL 1   JARDIANCE  10 MG TABS tablet Take 1 tablet (10 mg total) by mouth daily. Must have office visit for refills 30 tablet 0   loratadine  (CLARITIN ) 10 MG tablet Take 1 tablet by mouth once daily 90 tablet 3   melatonin 1 MG TABS tablet Take 1 tablet (1 mg total) by mouth at bedtime. 30 tablet  1   montelukast  (SINGULAIR ) 10 MG tablet TAKE ONE TABLET BY MOUTH DAILY AT BEDTIME 30 tablet 0   nitroGLYCERIN  (NITROSTAT ) 0.4 MG SL tablet Place 1 tablet (0.4 mg total) under the tongue every 5 (five) minutes x 3 doses as needed for chest pain. 25 tablet 3   omeprazole  (PRILOSEC) 20 MG capsule Take 1 capsule (20 mg total) by mouth daily. 90 capsule 1   potassium chloride  SA (KLOR-CON  M20) 20 MEQ tablet On the first day take 60meq (3 tablets) wait 4 hours then take another 60meq (3 tablets) by mouth. THEN THURSDAY 8/29 start 2 tablets by mouth 2 times daily. 90 tablet 3   pravastatin  (PRAVACHOL ) 80 MG tablet Take 1 tablet (80 mg total) by mouth every evening. 90 tablet 3   sacubitril -valsartan   (ENTRESTO ) 97-103 MG Take 1 tablet by mouth 2 (two) times daily. 180 tablet 2   ticagrelor  (BRILINTA ) 90 MG TABS tablet Take 1 tablet (90 mg total) by mouth 2 (two) times daily. 60 tablet 11   traZODone  (DESYREL ) 50 MG tablet TAKE HALF TABLET BY MOUTH AT BEDTIME 30 tablet 1   [DISCONTINUED] Accu-Chek Softclix Lancets lancets Use to measure blood sugar twice a day 100 each 6   [DISCONTINUED] Blood Glucose Monitoring Suppl (ACCU-CHEK GUIDE ME) w/Device KIT 1 kit by Does not apply route in the morning and at bedtime. Use to measure blood sugar twice a day 1 kit 0   [DISCONTINUED] glucose blood (ACCU-CHEK GUIDE) test strip Use to measure blood sugar twice a day 100 each 6     Assessment: 68 y.o. male with CHF and elevated troponin, possible ACS, for heparin  Goal of Therapy:  Heparin  level 0.3-0.7 units/ml Monitor platelets by anticoagulation protocol: Yes   Plan:  Heparin  4000 units IV bolus, then start heparin  1100 units/hr Check heparin  level in 8 hours.   Stephen Jennings 06/23/2024,8:05 AM

## 2024-06-23 NOTE — Progress Notes (Signed)
 PHARMACY - ANTICOAGULATION CONSULT NOTE  Pharmacy Consult for Heparin  Indication: chest pain/ACS  Allergies  Allergen Reactions   Atorvastatin      Muscle weakness and fatigue on 80mg  daily   Spironolactone      gynecomastia   Metformin  And Related Anxiety and Palpitations   Other Anxiety and Palpitations    Patient Measurements: Height: 5' 8 (172.7 cm) Weight: 93 kg (205 lb 1.6 oz) IBW/kg (Calculated) : 68.4 HEPARIN  DW (KG): 87.8  Vital Signs: Temp: 98 F (36.7 C) (11/02 1602) Temp Source: Oral (11/02 1602) BP: 117/66 (11/02 1602) Pulse Rate: 90 (11/02 1602)  Labs: Recent Labs    06/23/24 0401 06/23/24 0548 06/23/24 0608 06/23/24 0621 06/23/24 1848  HGB 14.2  --  15.3  --   --   HCT 45.3  --  45.0  --   --   PLT 415*  --   --   --   --   HEPARINUNFRC  --   --   --   --  0.14*  CREATININE 1.34*  --   --   --   --   TROPONINIHS 138* 1,241*  --  5,061*  --     Estimated Creatinine Clearance: 58.4 mL/min (A) (by C-G formula based on SCr of 1.34 mg/dL (H)).   Medical History: Past Medical History:  Diagnosis Date   Cardiogenic shock (HCC)    CHF (congestive heart failure) (HCC)    Coronary artery disease    Diabetes mellitus without complication (HCC)    Hyperlipidemia    Medical history non-contributory     Medications:  No current facility-administered medications on file prior to encounter.   Current Outpatient Medications on File Prior to Encounter  Medication Sig Dispense Refill   albuterol  (PROVENTIL ) (2.5 MG/3ML) 0.083% nebulizer solution Take 3 mLs (2.5 mg total) by nebulization every 6 (six) hours as needed for wheezing or shortness of breath. 75 mL 12   albuterol  (VENTOLIN  HFA) 108 (90 Base) MCG/ACT inhaler INHALE 2 PUFFS BY MOUTH EVERY 6 HOURS AS NEEDED FOR WHEEZING FOR SHORTNESS OF BREATH 18 g 0   benzonatate  (TESSALON ) 200 MG capsule Take 1 capsule (200 mg total) by mouth 3 (three) times daily as needed. For cough 30 capsule 6   bisoprolol   (ZEBETA ) 5 MG tablet Take 1 tablet (5 mg total) by mouth daily. NEEDS FOLLOW UP APPOINTMENT FOR MORE REFILLS 30 tablet 0   budesonide-glycopyrrolate-formoterol  (BREZTRI  AEROSPHERE) 160-9-4.8 MCG/ACT AERO inhaler Inhale 2 puffs into the lungs 2 (two) times daily. 3 each 4   chlorpheniramine  (CHLOR-TRIMETON ) 4 MG tablet 1 tab tid prn runny nose 40 tablet 0   co-enzyme Q-10 30 MG capsule Take 1 capsule (30 mg total) by mouth daily. 90 capsule 3   eplerenone  (INSPRA ) 25 MG tablet Take 0.5 tablets (12.5 mg total) by mouth daily. PLEASE SCHEDULE APPOINTMENT FOR MORE REFILLS 45 tablet 0   Evolocumab  (REPATHA  SURECLICK) 140 MG/ML SOAJ Inject 140 mg into the skin every 14 (fourteen) days. 6 mL 3   ezetimibe  (ZETIA ) 10 MG tablet Take 1 tablet (10 mg total) by mouth daily. 90 tablet 3   fluticasone  (FLONASE ) 50 MCG/ACT nasal spray USE 1 SPRAY IN EACH NOSTRIL ONCE DAILY AS NEEDED FOR ALLERGIES OR RHINITIS 48 g 0   furosemide  (LASIX ) 20 MG tablet Take 2 tablets (40 mg total) by mouth daily. (Patient taking differently: Take 20 mg by mouth daily. Takes one 20 mgh tablet daily) 180 tablet 3   Hyprom-Naphaz-Polysorb-Zn Sulf (CLEAR EYES COMPLETE)  SOLN Apply 1 drop to eye 3 (three) times daily. 15 mL 1   JARDIANCE  10 MG TABS tablet Take 1 tablet (10 mg total) by mouth daily. Must have office visit for refills 30 tablet 0   loratadine  (CLARITIN ) 10 MG tablet Take 1 tablet by mouth once daily 90 tablet 3   melatonin 1 MG TABS tablet Take 1 tablet (1 mg total) by mouth at bedtime. 30 tablet 1   montelukast  (SINGULAIR ) 10 MG tablet TAKE ONE TABLET BY MOUTH DAILY AT BEDTIME 30 tablet 0   nitroGLYCERIN  (NITROSTAT ) 0.4 MG SL tablet Place 1 tablet (0.4 mg total) under the tongue every 5 (five) minutes x 3 doses as needed for chest pain. 25 tablet 3   omeprazole  (PRILOSEC) 20 MG capsule Take 1 capsule (20 mg total) by mouth daily. 90 capsule 1   potassium chloride  SA (KLOR-CON  M20) 20 MEQ tablet On the first day take 60meq (3  tablets) wait 4 hours then take another 60meq (3 tablets) by mouth. THEN THURSDAY 8/29 start 2 tablets by mouth 2 times daily. 90 tablet 3   pravastatin  (PRAVACHOL ) 80 MG tablet Take 1 tablet (80 mg total) by mouth every evening. 90 tablet 3   sacubitril -valsartan  (ENTRESTO ) 97-103 MG Take 1 tablet by mouth 2 (two) times daily. 180 tablet 2   ticagrelor  (BRILINTA ) 90 MG TABS tablet Take 1 tablet (90 mg total) by mouth 2 (two) times daily. 60 tablet 11   traZODone  (DESYREL ) 50 MG tablet TAKE HALF TABLET BY MOUTH AT BEDTIME 30 tablet 1   [DISCONTINUED] Accu-Chek Softclix Lancets lancets Use to measure blood sugar twice a day 100 each 6   [DISCONTINUED] Blood Glucose Monitoring Suppl (ACCU-CHEK GUIDE ME) w/Device KIT 1 kit by Does not apply route in the morning and at bedtime. Use to measure blood sugar twice a day 1 kit 0   [DISCONTINUED] glucose blood (ACCU-CHEK GUIDE) test strip Use to measure blood sugar twice a day 100 each 6     Assessment: 68 y.o. male with CHF and elevated troponin, possible ACS, for heparin . No AC PTA.  Heparin  level is subtherapeutic at 0.14, on heparin  1100 units/hr. Hgb 15.3, plt 415. No s/sx of bleeding or infusion issues.  Goal of Therapy:  Heparin  level 0.3-0.7 units/ml Monitor platelets by anticoagulation protocol: Yes   Plan:  Increase heparin  infusion at 1350 units/hr  Check heparin  level in 6 hours Monitor daily HL, CBC, and for s/sx of bleeding   Thank you for allowing pharmacy to participate in this patient's care,  Suzen Sour, PharmD, BCCCP Clinical Pharmacist  Phone: [phone] 06/23/2024 7:43 PM  Please check AMION for all [facility] phone numbers After 10:00 PM, call Main Pharmacy [phone]

## 2024-06-23 NOTE — ED Notes (Signed)
 Pt's son Dewayne at bedside and pt gave a bag of home medications to the son to take home.

## 2024-06-23 NOTE — H&P (Signed)
 History and Physical  Stephen Jennings FMW:[id number] DOB: September 03, 1955 DOA: 06/23/2024  Referring physician: Dr. Nettie, EDP  PCP: Vicci Barnie NOVAK, MD  Outpatient Specialists: Cardiology. Patient coming from: Home.  Chief Complaint: Shortness of breath.  HPI: Stephen Jennings is a 68 y.o. male with medical history significant for COPD/asthma overlap, coronary artery disease status post PCI with stent to LAD, chronic combined diastolic and systolic CHF, type 2 diabetes, hypertension, who presented to the ER from home via EMS due to acute respiratory distress.  States he was trying to sleep but could not breathe or catch his breath.  He was doing fine yesterday.  Denies any chest pain.  Denies fevers or chills.  No productive cough prior to this event.  Upon EMS arrival, the patient was placed on CPAP and brought to the ER for further evaluation.  While in the ER the CPAP was replaced by BiPAP due to increased work of breathing.  In the ER, tachycardic and tachypneic with O2 saturation of 88% on room air.  Chest x-ray revealed findings concerning for pulmonary edema, pneumonia cannot be ruled out.  Lab studies were notable for BNP greater than 1600, troponin 138, WBC 22.3.  The patient received IV Lasix  40 mg x 1, Rocephin and IV azithromycin  for possible CAP.  Admitted by [facility], hospitalist service.  ED Course: Temperature 97.2.  BP 124/82, pulse 109, respiration rate 27, O2 saturation 100% on room air.  Review of Systems: Review of systems as noted in the HPI. All other systems reviewed and are negative.   Past Medical History:  Diagnosis Date   Cardiogenic shock (HCC)    CHF (congestive heart failure) (HCC)    Coronary artery disease    Diabetes mellitus without complication (HCC)    Hyperlipidemia    Medical history non-contributory    Past Surgical History:  Procedure Laterality Date   CARDIAC CATHETERIZATION     CORONARY BALLOON ANGIOPLASTY N/A 02/04/2021   Procedure: CORONARY  BALLOON ANGIOPLASTY;  Surgeon: Burnard Debby LABOR, MD;  Location: [facility] INVASIVE CV LAB;  Service: Cardiovascular;  Laterality: N/A;   CORONARY THROMBECTOMY N/A 02/04/2021   Procedure: Coronary Thrombectomy;  Surgeon: Burnard Debby LABOR, MD;  Location: [facility] INVASIVE CV LAB;  Service: Cardiovascular;  Laterality: N/A;   CORONARY ULTRASOUND/IVUS N/A 02/04/2021   Procedure: Intravascular Ultrasound/IVUS;  Surgeon: Burnard Debby LABOR, MD;  Location: [facility] INVASIVE CV LAB;  Service: Cardiovascular;  Laterality: N/A;   CORONARY/GRAFT ACUTE MI REVASCULARIZATION N/A 03/31/2019   Procedure: Coronary/Graft Acute MI Revascularization;  Surgeon: Burnard Debby LABOR, MD;  Location: [facility] INVASIVE CV LAB;  Service: Cardiovascular;  Laterality: N/A;   ICD IMPLANT N/A 04/15/2019   Procedure: ICD IMPLANT;  Surgeon: Inocencio Soyla Lunger, MD;  Location: [facility] INVASIVE CV LAB;  Service: Cardiovascular;  Laterality: N/A;   LEFT HEART CATH AND CORONARY ANGIOGRAPHY N/A 03/31/2019   Procedure: LEFT HEART CATH AND CORONARY ANGIOGRAPHY;  Surgeon: Burnard Debby LABOR, MD;  Location: [facility] INVASIVE CV LAB;  Service: Cardiovascular;  Laterality: N/A;   LEFT HEART CATH AND CORONARY ANGIOGRAPHY N/A 02/02/2021   Procedure: LEFT HEART CATH AND CORONARY ANGIOGRAPHY;  Surgeon: Burnard Debby LABOR, MD;  Location: [facility] INVASIVE CV LAB;  Service: Cardiovascular;  Laterality: N/A;   LEFT HEART CATH AND CORONARY ANGIOGRAPHY N/A 02/04/2021   Procedure: LEFT HEART CATH AND CORONARY ANGIOGRAPHY;  Surgeon: Burnard Debby LABOR, MD;  Location: [facility] INVASIVE CV LAB;  Service: Cardiovascular;  Laterality: N/A;   NO PAST SURGERIES     RIGHT HEART  CATH N/A 03/31/2019   Procedure: RIGHT HEART CATH;  Surgeon: Burnard Debby LABOR, MD;  Location: [facility] INVASIVE CV LAB;  Service: Cardiovascular;  Laterality: N/A;   VENTRICULAR ASSIST DEVICE INSERTION N/A 03/31/2019   Procedure: VENTRICULAR ASSIST DEVICE INSERTION;  Surgeon: Burnard Debby LABOR, MD;  Location: [facility] INVASIVE CV LAB;  Service: Cardiovascular;  Laterality:  N/A;    Social History:  reports that he quit smoking about 5 years ago. His smoking use included cigarettes. He has been exposed to tobacco smoke. He has never used smokeless tobacco. He reports that he does not drink alcohol  and does not use drugs.   Allergies  Allergen Reactions   Atorvastatin      Muscle weakness and fatigue on 80mg  daily   Spironolactone      gynecomastia   Metformin  And Related Anxiety and Palpitations   Other Anxiety and Palpitations    Family History  Problem Relation Age of Onset   Hypertension Mother    Heart failure Mother    Headache Neg Hx    Migraines Neg Hx       Prior to Admission medications   Medication Sig Start Date End Date Taking? Authorizing Provider  Accu-Chek Softclix Lancets lancets Use to measure blood sugar twice a day 11/06/19   Vicci Barnie NOVAK, MD  albuterol  (PROVENTIL ) (2.5 MG/3ML) 0.083% nebulizer solution Take 3 mLs (2.5 mg total) by nebulization every 6 (six) hours as needed for wheezing or shortness of breath. 08/23/21   Doretha Folks, MD  albuterol  (VENTOLIN  HFA) 108 (90 Base) MCG/ACT inhaler INHALE 2 PUFFS BY MOUTH EVERY 6 HOURS AS NEEDED FOR WHEEZING FOR SHORTNESS OF BREATH 08/31/23   Hunsucker, Donnice SAUNDERS, MD  benzonatate  (TESSALON ) 200 MG capsule Take 1 capsule (200 mg total) by mouth 3 (three) times daily as needed. For cough 02/03/23   Hunsucker, Donnice SAUNDERS, MD  bisoprolol  (ZEBETA ) 5 MG tablet Take 1 tablet (5 mg total) by mouth daily. NEEDS FOLLOW UP APPOINTMENT FOR MORE REFILLS 09/13/23   Glena Harlene HERO, [name]  Blood Glucose Monitoring Suppl (ACCU-CHEK GUIDE ME) w/Device KIT 1 kit by Does not apply route in the morning and at bedtime. Use to measure blood sugar twice a day 11/06/19   Vicci Barnie NOVAK, MD  budesonide-glycopyrrolate-formoterol  (BREZTRI  AEROSPHERE) 160-9-4.8 MCG/ACT AERO inhaler Inhale 2 puffs into the lungs 2 (two) times daily. 05/29/24   Hunsucker, Donnice SAUNDERS, MD  chlorpheniramine  (CHLOR-TRIMETON ) 4 MG tablet  1 tab tid prn runny nose 08/17/22   McClung, Angela M, [name]  co-enzyme Q-10 30 MG capsule Take 1 capsule (30 mg total) by mouth daily. 05/25/21   Milford, Harlene HERO, [name]  eplerenone  (INSPRA ) 25 MG tablet Take 0.5 tablets (12.5 mg total) by mouth daily. PLEASE SCHEDULE APPOINTMENT FOR MORE REFILLS 03/21/24   Glena Harlene HERO, [name]  Evolocumab  (REPATHA  SURECLICK) 140 MG/ML SOAJ Inject 140 mg into the skin every 14 (fourteen) days. 03/18/24   Camnitz, Soyla Lunger, MD  ezetimibe  (ZETIA ) 10 MG tablet Take 1 tablet (10 mg total) by mouth daily. 09/08/23   Vicci Barnie NOVAK, MD  fluticasone  (FLONASE ) 50 MCG/ACT nasal spray USE 1 SPRAY IN EACH NOSTRIL ONCE DAILY AS NEEDED FOR ALLERGIES OR RHINITIS 08/18/23   Vicci Barnie NOVAK, MD  furosemide  (LASIX ) 20 MG tablet Take 2 tablets (40 mg total) by mouth daily. Patient taking differently: Take 20 mg by mouth daily. Takes one 20 mgh tablet daily 09/04/23   Glena Harlene HERO, [name]  glucose blood (ACCU-CHEK GUIDE) test strip Use to  measure blood sugar twice a day 11/06/19   Vicci Barnie NOVAK, MD  Hyprom-Naphaz-Polysorb-Zn Sulf (CLEAR EYES COMPLETE) SOLN Apply 1 drop to eye 3 (three) times daily. 05/08/19   Brien Belvie BRAVO, MD  JARDIANCE  10 MG TABS tablet Take 1 tablet (10 mg total) by mouth daily. Must have office visit for refills 06/06/24   Vicci Barnie NOVAK, MD  loratadine  (CLARITIN ) 10 MG tablet Take 1 tablet by mouth once daily 05/23/23   Hunsucker, Donnice SAUNDERS, MD  melatonin 1 MG TABS tablet Take 1 tablet (1 mg total) by mouth at bedtime. 01/02/23   Vicci Barnie NOVAK, MD  montelukast  (SINGULAIR ) 10 MG tablet TAKE ONE TABLET BY MOUTH DAILY AT BEDTIME 05/15/24   Vicci Barnie NOVAK, MD  nitroGLYCERIN  (NITROSTAT ) 0.4 MG SL tablet Place 1 tablet (0.4 mg total) under the tongue every 5 (five) minutes x 3 doses as needed for chest pain. 02/06/21   Duke, Jon Garre, PA  omeprazole  (PRILOSEC) 20 MG capsule Take 1 capsule (20 mg total) by mouth daily. 09/28/22   Vicci Barnie NOVAK, MD  potassium chloride  SA (KLOR-CON  M20) 20 MEQ tablet On the first day take 60meq (3 tablets) wait 4 hours then take another 60meq (3 tablets) by mouth. THEN THURSDAY 8/29 start 2 tablets by mouth 2 times daily. 04/19/23   Clegg, Amy D, NP  pravastatin  (PRAVACHOL ) 80 MG tablet Take 1 tablet (80 mg total) by mouth every evening. 09/08/23   Vicci Barnie NOVAK, MD  sacubitril -valsartan  (ENTRESTO ) 97-103 MG Take 1 tablet by mouth 2 (two) times daily. 05/01/23   Clegg, Amy D, NP  ticagrelor  (BRILINTA ) 90 MG TABS tablet Take 1 tablet (90 mg total) by mouth 2 (two) times daily. 04/17/23   Bensimhon, Toribio SAUNDERS, MD  traZODone  (DESYREL ) 50 MG tablet TAKE HALF TABLET BY MOUTH AT BEDTIME 03/18/24   Vicci Barnie NOVAK, MD    Physical Exam: BP 125/82   Pulse (!) 101   Temp (!) 97.2 F (36.2 C) (Axillary)   Resp (!) 26   SpO2 100%   General: 68 y.o. year-old male well developed well nourished in no acute distress.  Alert and oriented x3.  On BiPAP. Cardiovascular: Regular rate and rhythm with no rubs or gallops.  No thyromegaly or JVD noted.  Trace lower extremity edema bilaterally.  Respiratory: Diffuse faint rales bilaterally.  Poor inspiratory effort.   Abdomen: Soft nontender nondistended with normal bowel sounds x4 quadrants. Muskuloskeletal: No cyanosis or clubbing noted bilaterally Neuro: CN II-XII intact, strength, sensation, reflexes Skin: No ulcerative lesions noted or rashes Psychiatry: Judgement and insight appear normal. Mood is appropriate for condition and setting          Labs on Admission:  Basic Metabolic Panel: Recent Labs  Lab 06/23/24 0401  NA 138  K 4.4  CL 104  CO2 23  GLUCOSE 360*  BUN 16  CREATININE 1.34*  CALCIUM  8.7*   Liver Function Tests: No results for input(s): AST, ALT, ALKPHOS, BILITOT, PROT, ALBUMIN in the last 168 hours. No results for input(s): LIPASE, AMYLASE in the last 168 hours. No results for input(s): AMMONIA in the last 168  hours. CBC: Recent Labs  Lab 06/23/24 0401  WBC 22.3*  HGB 14.2  HCT 45.3  MCV 94.8  PLT 415*   Cardiac Enzymes: No results for input(s): CKTOTAL, CKMB, CKMBINDEX, TROPONINI in the last 168 hours.  BNP (last 3 results) Recent Labs    12/26/23 0928 06/23/24 0401  BNP 515.9* 1,604.7*  ProBNP (last 3 results) No results for input(s): PROBNP in the last 8760 hours.  CBG: No results for input(s): GLUCAP in the last 168 hours.  Radiological Exams on Admission: DG Chest Portable 1 View Result Date: 06/23/2024 CLINICAL DATA:  Initial evaluation for acute shortness of breath. EXAM: PORTABLE CHEST 1 VIEW COMPARISON:  Prior radiograph from 07/16/2022. FINDINGS: Diffuse related pad overlies the right chest. Left-sided dual lead transvenous pacemaker/AICD in place with electrodes overlying the right atrium and right ventricle. Transverse heart size at the upper limits of normal. Mediastinal silhouette within normal limits. Lungs normally inflated. Perihilar vascular congestion with diffuse interstitial prominence, consistent with a degree of pulmonary interstitial edema. Superimposed patchy consolidative opacity at the right lower lobe, concerning for pneumonia. Probable trace right pleural effusion. No pneumothorax. Visualized osseous structures and soft tissues demonstrate no acute finding. IMPRESSION: 1. Patchy consolidative right lower lobe opacity, concerning for pneumonia. 2. Underlying diffuse interstitial prominence, consistent with pulmonary interstitial edema. 3. Probable trace right pleural effusion. Electronically Signed   By: Morene Hoard M.D.   On: 06/23/2024 04:47    EKG: I independently viewed the EKG done and my findings are as followed: Sinus tachycardia rate of 121.  Nonspecific ST changes.  QTc 460.  Assessment/Plan Present on Admission:  Acute on chronic systolic (congestive) heart failure (HCC)  Principal Problem:   Acute on chronic systolic  (congestive) heart failure (HCC)  Acute on chronic combined diastolic and systolic CHF BNP elevated greater than 1600 Pulmonary edema on chest x-ray Continue diuresing Start strict I's and O's and daily weight.  Elevated troponin, suspect demand ischemia in the setting of acute hypoxia Troponin 138, trend No evidence of acute ischemia on 12-lead EKG Cardiology consulted by EDP Follow transthoracic echocardiogram.  Possible multifocal community-acquired pneumonia, POA Personally reviewed chest x-ray which shows patchy consolidative right lower lobe opacity concerning for pneumonia, increased pulmonary vascularity consistent with pulmonary edema. WBC 22.3 K Received Rocephin and azithromycin  in the ER. Continue azithromycin , add cefepime  with history of Pseudomonas pneumonia. Monitor fever curve and WBCs. Follow lactic acid  Acute hypoxic respiratory failure secondary to pulmonary edema versus pneumonia O2 saturation in the 80s on room air Currently on BiPAP due to increased work of breathing Wean off BiPAP as tolerated Strict n.p.o. when on BiPAP. Continue nebulizers  Type 2 diabetes with hyperglycemia Blood sugar 360 N.p.o. while on BiPAP. Insulin  sliding scale every 4 hours while NPO Lantus  5 unit daily  Coronary artery disease status post stent to LAD Denies any chest pain. Initial troponin 138, trend troponin Follow-up repeat transthoracic echocardiogram Monitor on telemetry Cardiology consulted by EDP.  AKI, suspect multifactorial. Baseline creatinine appears to be 0.9 with GFR greater than 60 Presented with creatinine 1.34 with GFR 58, BUN 16. Avoid nephrotoxic agents, and hypotension. Monitor urine output.  Asthma/COPD Resume home regimen  Hyperlipidemia Resume home regimen   Critical care time: 55 minutes.    DVT prophylaxis: Subcu Lovenox daily.  Code Status: Full code.  Family Communication: None at bedside.  Disposition Plan: Admitted to the  progressive care unit.  Consults called: Cardiology consulted by EDP.  Admission status: Inpatient status.   Status is: Inpatient The patient requires at least 2 midnights for further evaluation and treatment of present condition.   Terry LOISE Hurst MD [facility] Pager [phone]  If 7PM-7AM, please contact night-coverage [email or web address] Password [email or web address]  06/23/2024, 5:39 AM

## 2024-06-23 NOTE — Consult Note (Addendum)
 CARDIOLOGY CONSULT NOTE    Patient ID: Stephen Jennings; [id number]; 1955/10/28   Admit date: 06/23/2024 Date of Consult: 06/23/2024  Primary Care Provider: Vicci Barnie NOVAK, MD Primary Cardiologist:  Primary Electrophysiologist:    History of Present Illness:   Mr. Lancour is a 68 year old M known to have CAD manifested by anterior STEMI and cardiac arrest s/p LAD PCI c/w cardiogenic shock requiring pressors and Impella, torsades s/p ICD prior to discharge, NSTEMI in 2022 s/p Aggrastat  48 hours followed by LAD PCI (utilizing Pronto thrombectomy), ultimate PTCA of the distal stent segment and mid LAD with residual narrowing 0%, HFimpEF/chronic systolic heart failure (20 to 74% improved to 40 to 45%), COPD, DM 2, HTN presented to the ER with shortness of breath.  Patient woke up around 3 to 4 AM this morning with symptoms of severe shortness of breath.Called 911. arrived to the ER.  No angina.  No dizziness, syncope, nausea, leg swelling.  Imaging showed pulmonary vascular congestion and right lower lobe opacity consistent with pneumonia.  BNP elevated, 1604. Hs troponin significantly elevated, 138>>1241>>5061.  Lactic acid 3.  WBC 22.3.  Serum creatinine 1.34.  Chloride 104.  LFTs not obtained.  EKG showed NSR, anteroseptal infarct, no new ischemia.  Past Medical History:  Diagnosis Date   Cardiogenic shock (HCC)    CHF (congestive heart failure) (HCC)    Coronary artery disease    Diabetes mellitus without complication (HCC)    Hyperlipidemia    Medical history non-contributory     Past Surgical History:  Procedure Laterality Date   CARDIAC CATHETERIZATION     CORONARY BALLOON ANGIOPLASTY N/A 02/04/2021   Procedure: CORONARY BALLOON ANGIOPLASTY;  Surgeon: Burnard Debby LABOR, MD;  Location: [facility] INVASIVE CV LAB;  Service: Cardiovascular;  Laterality: N/A;   CORONARY THROMBECTOMY N/A 02/04/2021   Procedure: Coronary Thrombectomy;  Surgeon: Burnard Debby LABOR, MD;  Location: [facility] INVASIVE CV  LAB;  Service: Cardiovascular;  Laterality: N/A;   CORONARY ULTRASOUND/IVUS N/A 02/04/2021   Procedure: Intravascular Ultrasound/IVUS;  Surgeon: Burnard Debby LABOR, MD;  Location: [facility] INVASIVE CV LAB;  Service: Cardiovascular;  Laterality: N/A;   CORONARY/GRAFT ACUTE MI REVASCULARIZATION N/A 03/31/2019   Procedure: Coronary/Graft Acute MI Revascularization;  Surgeon: Burnard Debby LABOR, MD;  Location: [facility] INVASIVE CV LAB;  Service: Cardiovascular;  Laterality: N/A;   ICD IMPLANT N/A 04/15/2019   Procedure: ICD IMPLANT;  Surgeon: Inocencio Soyla Lunger, MD;  Location: [facility] INVASIVE CV LAB;  Service: Cardiovascular;  Laterality: N/A;   LEFT HEART CATH AND CORONARY ANGIOGRAPHY N/A 03/31/2019   Procedure: LEFT HEART CATH AND CORONARY ANGIOGRAPHY;  Surgeon: Burnard Debby LABOR, MD;  Location: [facility] INVASIVE CV LAB;  Service: Cardiovascular;  Laterality: N/A;   LEFT HEART CATH AND CORONARY ANGIOGRAPHY N/A 02/02/2021   Procedure: LEFT HEART CATH AND CORONARY ANGIOGRAPHY;  Surgeon: Burnard Debby LABOR, MD;  Location: [facility] INVASIVE CV LAB;  Service: Cardiovascular;  Laterality: N/A;   LEFT HEART CATH AND CORONARY ANGIOGRAPHY N/A 02/04/2021   Procedure: LEFT HEART CATH AND CORONARY ANGIOGRAPHY;  Surgeon: Burnard Debby LABOR, MD;  Location: [facility] INVASIVE CV LAB;  Service: Cardiovascular;  Laterality: N/A;   NO PAST SURGERIES     RIGHT HEART CATH N/A 03/31/2019   Procedure: RIGHT HEART CATH;  Surgeon: Burnard Debby LABOR, MD;  Location: [facility] INVASIVE CV LAB;  Service: Cardiovascular;  Laterality: N/A;   VENTRICULAR ASSIST DEVICE INSERTION N/A 03/31/2019   Procedure: VENTRICULAR ASSIST DEVICE INSERTION;  Surgeon: Burnard Debby LABOR, MD;  Location: [facility] INVASIVE  CV LAB;  Service: Cardiovascular;  Laterality: N/A;       Inpatient Medications: Scheduled Meds:  budesonide-glycopyrrolate-formoterol   2 puff Inhalation BID   ezetimibe   10 mg Oral Daily   furosemide   80 mg Intravenous BID   insulin  aspart  0-9 Units Subcutaneous Q4H   insulin  glargine-yfgn  5  Units Subcutaneous Daily   montelukast   10 mg Oral QHS   pravastatin   80 mg Oral QPM   Continuous Infusions:  [START ON 06/24/2024] azithromycin      ceFEPime  (MAXIPIME ) IV 2 g (06/23/24 1126)   heparin  1,100 Units/hr (06/23/24 0834)   PRN Meds: acetaminophen , melatonin, nitroGLYCERIN , polyethylene glycol, prochlorperazine  Allergies:    Allergies  Allergen Reactions   Atorvastatin      Muscle weakness and fatigue on 80mg  daily   Spironolactone      gynecomastia   Metformin  And Related Anxiety and Palpitations   Other Anxiety and Palpitations    Social History:   Social History   Socioeconomic History   Marital status: Married    Spouse name: Not on file   Number of children: 1   Years of education: 8   Highest education level: 8th grade  Occupational History   Not on file  Tobacco Use   Smoking status: Former    Current packs/day: 0.00    Types: Cigarettes    Quit date: 04/08/2019    Years since quitting: 5.2    Passive exposure: Current (wife smokes)   Smokeless tobacco: Never  Vaping Use   Vaping status: Never Used  Substance and Sexual Activity   Alcohol  use: No   Drug use: No   Sexual activity: Not Currently  Other Topics Concern   Not on file  Social History Narrative   Lives with wife   Right handed   Caffeine: about 2 cups of coffee every morning, no soda   Social Drivers of Corporate Investment Banker Strain: Low Risk  (09/08/2023)   Overall Financial Resource Strain (CARDIA)    Difficulty of Paying Living Expenses: Not very hard  Food Insecurity: No Food Insecurity (06/23/2024)   Hunger Vital Sign    Worried About Running Out of Food in the Last Year: Never true    Ran Out of Food in the Last Year: Never true  Transportation Needs: No Transportation Needs (06/23/2024)   PRAPARE - Administrator, Civil Service (Medical): No    Lack of Transportation (Non-Medical): No  Physical Activity: Inactive (09/08/2023)   Exercise Vital Sign     Days of Exercise per Week: 0 days    Minutes of Exercise per Session: 0 min  Stress: No Stress Concern Present (09/08/2023)   [employer] of Occupational Health - Occupational Stress Questionnaire    Feeling of Stress : Not at all  Social Connections: Moderately Isolated (06/23/2024)   Social Connection and Isolation Panel    Frequency of Communication with Friends and Family: More than three times a week    Frequency of Social Gatherings with Friends and Family: Twice a week    Attends Religious Services: Never    Database Administrator or Organizations: No    Attends Banker Meetings: Never    Marital Status: Married  Catering Manager Violence: Not At Risk (06/23/2024)   Humiliation, Afraid, Rape, and Kick questionnaire    Fear of Current or Ex-Partner: No    Emotionally Abused: No    Physically Abused: No    Sexually Abused: No  Family History:    Family History  Problem Relation Age of Onset   Hypertension Mother    Heart failure Mother    Headache Neg Hx    Migraines Neg Hx      ROS:  Please see the history of present illness.  ROS  All other ROS reviewed and negative.     Physical Exam/Data:   Vitals:   06/23/24 0750 06/23/24 0945 06/23/24 1015 06/23/24 1128  BP: 106/79 112/85 118/85 118/84  Pulse: 86 87 90 89  Resp: (!) 24 20 (!) 24 (!) 22  Temp: 98.1 F (36.7 C)   97.8 F (36.6 C)  TempSrc: Oral   Oral  SpO2: 96% 98% 98% 98%    Intake/Output Summary (Last 24 hours) at 06/23/2024 1241 Last data filed at 06/23/2024 1105 Gross per 24 hour  Intake 9 ml  Output 1150 ml  Net -1141 ml   There were no vitals filed for this visit. There is no height or weight on file to calculate BMI.  General:  Well nourished, well developed, in no acute distress HEENT: normal Lymph: no adenopathy Neck: JVD elevated Endocrine:  No thryomegaly Vascular: No carotid bruits; FA pulses 2+ bilaterally without bruits  Cardiac:  normal S1, S2; RRR; no murmur   Lungs:  b/l rales Abd: soft, nontender, no hepatomegaly  Ext: no edema Musculoskeletal:  No deformities, BUE and BLE strength normal and equal Skin: warm and dry  Neuro:  CNs 2-12 intact, no focal abnormalities noted Psych:  Normal affect   Laboratory Data:  Chemistry Recent Labs  Lab 06/23/24 0401 06/23/24 0608  NA 138 141  K 4.4 4.2  CL 104  --   CO2 23  --   GLUCOSE 360*  --   BUN 16  --   CREATININE 1.34*  --   CALCIUM  8.7*  --   GFRNONAA 58*  --   ANIONGAP 11  --     No results for input(s): PROT, ALBUMIN, AST, ALT, ALKPHOS, BILITOT in the last 168 hours. Hematology Recent Labs  Lab 06/23/24 0401 06/23/24 0608  WBC 22.3*  --   RBC 4.78  --   HGB 14.2 15.3  HCT 45.3 45.0  MCV 94.8  --   MCH 29.7  --   MCHC 31.3  --   RDW 13.9  --   PLT 415*  --    Cardiac EnzymesNo results for input(s): TROPONINI in the last 168 hours. No results for input(s): TROPIPOC in the last 168 hours.  BNP Recent Labs  Lab 06/23/24 0401  BNP 1,604.7*    DDimer No results for input(s): DDIMER in the last 168 hours.  Radiology/Studies:  DG Chest Portable 1 View Result Date: 06/23/2024 CLINICAL DATA:  Initial evaluation for acute shortness of breath. EXAM: PORTABLE CHEST 1 VIEW COMPARISON:  Prior radiograph from 07/16/2022. FINDINGS: Diffuse related pad overlies the right chest. Left-sided dual lead transvenous pacemaker/AICD in place with electrodes overlying the right atrium and right ventricle. Transverse heart size at the upper limits of normal. Mediastinal silhouette within normal limits. Lungs normally inflated. Perihilar vascular congestion with diffuse interstitial prominence, consistent with a degree of pulmonary interstitial edema. Superimposed patchy consolidative opacity at the right lower lobe, concerning for pneumonia. Probable trace right pleural effusion. No pneumothorax. Visualized osseous structures and soft tissues demonstrate no acute finding.  IMPRESSION: 1. Patchy consolidative right lower lobe opacity, concerning for pneumonia. 2. Underlying diffuse interstitial prominence, consistent with pulmonary interstitial edema. 3. Probable trace right  pleural effusion. Electronically Signed   By: Morene Hoard M.D.   On: 06/23/2024 04:47    Assessment and Plan:   Acute on chronic systolic and diastolic heart failure ICM LVEF 40-45% in 2025 - Presented with sudden onset of severe SOB since 3 AM today.  No angina/leg swelling.  Sitting up in the bed, cannot lay flat. - He had fried food yesterday and did not take lasix  for 1 day. - BNP elevated, 1604. - Imaging showed evidence of pulmonary edema. - Received IV Lasix  80 mg with 1.1 L urine output. - Start IV Lasix  80 mg twice daily. - Hold BB while in ADHF. - BP soft to resume Entresto  - Patient needs LHC once volume optimized and prior to discharge. Now, cannot lay flat.  For now, we will keep him n.p.o. after midnight.  Will need to reassess tomorrow a.m. and schedule him for [facility]. Last dose of Jardiance  was yesterday. - Repeat Echo.  Type II NSTEMI - Likely from volume overload and community-acquired pneumonia. - Hs troponin significantly elevated, 138>>1241>>5061. Peak troponin in 2022 was 3429 and in 2020 was 17,000. - Due to significant troponin elevation and prior LAD disease, agree with heparin  drip. - Patient needs LHC once volume optimized and prior to discharge. Now, cannot lay flat.  For now, we will keep him n.p.o. after midnight.  Will need to reassess) tomorrow a.m. and schedule him for [facility].  Last dose of Jardiance  was yesterday.  Community-acquired pneumonia - Imaging showed evidence of pneumonia. - Has leukocytosis and SOB. - On IV antibiotics, management per primary team.  Lactic acidosis - Lactic acid 3.  Should improve with diuresis.  Repeat lactic acid tonight.  CAD s/p LAD PCI in 2020 and 2022 - Continue DAPT and statin. Hold Jardiance .  Hx of Torsades  s/p ICD - Monitor   60 minutes spent in reviewing prior medical records, more than 3 labs, discussion and documentation.  For questions or updates, please contact [facility] Please consult [email or web address] for contact info under Cardiology/STEMI.   Signed, Avin Gibbons Priya Jearldean Gutt, MD 06/23/2024 12:41 PM

## 2024-06-23 NOTE — Plan of Care (Signed)
  Problem: Metabolic: Goal: Ability to maintain appropriate glucose levels will improve Outcome: Progressing   Problem: Clinical Measurements: Goal: Ability to maintain clinical measurements within normal limits will improve Outcome: Progressing Goal: Diagnostic test results will improve Outcome: Progressing   Problem: Activity: Goal: Risk for activity intolerance will decrease Outcome: Progressing

## 2024-06-24 ENCOUNTER — Inpatient Hospital Stay (HOSPITAL_COMMUNITY)

## 2024-06-24 DIAGNOSIS — I5023 Acute on chronic systolic (congestive) heart failure: Secondary | ICD-10-CM | POA: Diagnosis not present

## 2024-06-24 DIAGNOSIS — I509 Heart failure, unspecified: Secondary | ICD-10-CM | POA: Diagnosis not present

## 2024-06-24 DIAGNOSIS — I2510 Atherosclerotic heart disease of native coronary artery without angina pectoris: Secondary | ICD-10-CM | POA: Diagnosis not present

## 2024-06-24 LAB — ECHOCARDIOGRAM COMPLETE
Area-P 1/2: 4.68 cm2
Calc EF: 31.9 %
Height: 68 in
S' Lateral: 5.3 cm
Single Plane A2C EF: 38 %
Single Plane A4C EF: 22.9 %
Weight: 3241.64 [oz_av]

## 2024-06-24 LAB — CBC WITH DIFFERENTIAL/PLATELET
Abs Immature Granulocytes: 0.06 K/uL (ref 0.00–0.07)
Basophils Absolute: 0 K/uL (ref 0.0–0.1)
Basophils Relative: 0 %
Eosinophils Absolute: 0 K/uL (ref 0.0–0.5)
Eosinophils Relative: 0 %
HCT: 38.5 % — ABNORMAL LOW (ref 39.0–52.0)
Hemoglobin: 12.7 g/dL — ABNORMAL LOW (ref 13.0–17.0)
Immature Granulocytes: 0 %
Lymphocytes Relative: 6 %
Lymphs Abs: 0.8 K/uL (ref 0.7–4.0)
MCH: 30.2 pg (ref 26.0–34.0)
MCHC: 33 g/dL (ref 30.0–36.0)
MCV: 91.4 fL (ref 80.0–100.0)
Monocytes Absolute: 1.1 K/uL — ABNORMAL HIGH (ref 0.1–1.0)
Monocytes Relative: 8 %
Neutro Abs: 12.2 K/uL — ABNORMAL HIGH (ref 1.7–7.7)
Neutrophils Relative %: 86 %
Platelets: 251 K/uL (ref 150–400)
RBC: 4.21 MIL/uL — ABNORMAL LOW (ref 4.22–5.81)
RDW: 14.1 % (ref 11.5–15.5)
WBC: 14.2 K/uL — ABNORMAL HIGH (ref 4.0–10.5)
nRBC: 0 % (ref 0.0–0.2)

## 2024-06-24 LAB — BASIC METABOLIC PANEL WITH GFR
Anion gap: 11 (ref 5–15)
BUN: 26 mg/dL — ABNORMAL HIGH (ref 8–23)
CO2: 25 mmol/L (ref 22–32)
Calcium: 8.4 mg/dL — ABNORMAL LOW (ref 8.9–10.3)
Chloride: 105 mmol/L (ref 98–111)
Creatinine, Ser: 1.34 mg/dL — ABNORMAL HIGH (ref 0.61–1.24)
GFR, Estimated: 58 mL/min — ABNORMAL LOW (ref >60)
Glucose, Bld: 141 mg/dL — ABNORMAL HIGH (ref 70–99)
Potassium: 3.7 mmol/L (ref 3.5–5.1)
Sodium: 141 mmol/L (ref 135–145)

## 2024-06-24 LAB — HEPARIN LEVEL (UNFRACTIONATED)
Heparin Unfractionated: 0.2 [IU]/mL — ABNORMAL LOW (ref 0.30–0.70)
Heparin Unfractionated: 0.43 [IU]/mL (ref 0.30–0.70)

## 2024-06-24 LAB — GLUCOSE, CAPILLARY
Glucose-Capillary: 133 mg/dL — ABNORMAL HIGH (ref 70–99)
Glucose-Capillary: 132 mg/dL — ABNORMAL HIGH (ref 70–99)
Glucose-Capillary: 94 mg/dL (ref 70–99)
Glucose-Capillary: 113 mg/dL — ABNORMAL HIGH (ref 70–99)

## 2024-06-24 LAB — PHOSPHORUS: Phosphorus: 4.2 mg/dL (ref 2.5–4.6)

## 2024-06-24 LAB — MAGNESIUM: Magnesium: 2.1 mg/dL (ref 1.7–2.4)

## 2024-06-24 MED ORDER — SODIUM CHLORIDE 0.9 % IV SOLN: INTRAVENOUS | Status: DC

## 2024-06-24 MED ORDER — PERFLUTREN LIPID MICROSPHERE
1.0000 mL | INTRAVENOUS | Status: AC | PRN
  Administered 2024-06-24: 2 mL via INTRAVENOUS

## 2024-06-24 MED ORDER — ASPIRIN 81 MG PO CHEW
81.0000 mg | CHEWABLE_TABLET | ORAL | Status: AC
  Administered 2024-06-25: 81 mg via ORAL
  Filled 2024-06-24: qty 1

## 2024-06-24 MED ORDER — HEPARIN BOLUS VIA INFUSION
2000.0000 [IU] | Freq: Once | INTRAVENOUS | Status: AC
  Administered 2024-06-24: 2000 [IU] via INTRAVENOUS
  Filled 2024-06-24: qty 2000

## 2024-06-24 MED ORDER — ASPIRIN 81 MG PO TBEC
81.0000 mg | DELAYED_RELEASE_TABLET | Freq: Every day | ORAL | Status: DC
  Administered 2024-06-26: 81 mg via ORAL
  Filled 2024-06-24: qty 1

## 2024-06-24 NOTE — Plan of Care (Signed)
  Problem: Education: Goal: Ability to describe self-care measures that may prevent or decrease complications (Diabetes Survival Skills Education) will improve Outcome: Progressing Goal: Individualized Educational Video(s) Outcome: Progressing   Problem: Coping: Goal: Ability to adjust to condition or change in health will improve Outcome: Progressing   Problem: Fluid Volume: Goal: Ability to maintain a balanced intake and output will improve Outcome: Progressing   Problem: Health Behavior/Discharge Planning: Goal: Ability to identify and utilize available resources and services will improve Outcome: Progressing Goal: Ability to manage health-related needs will improve Outcome: Progressing   Problem: Metabolic: Goal: Ability to maintain appropriate glucose levels will improve Outcome: Progressing   Problem: Nutritional: Goal: Maintenance of adequate nutrition will improve Outcome: Progressing Goal: Progress toward achieving an optimal weight will improve Outcome: Progressing   Problem: Skin Integrity: Goal: Risk for impaired skin integrity will decrease Outcome: Progressing   Problem: Tissue Perfusion: Goal: Adequacy of tissue perfusion will improve Outcome: Progressing   Problem: Education: Goal: Knowledge of General Education information will improve Description: Including pain rating scale, medication(s)/side effects and non-pharmacologic comfort measures Outcome: Progressing   Problem: Clinical Measurements: Goal: Ability to maintain clinical measurements within normal limits will improve Outcome: Progressing Goal: Will remain free from infection Outcome: Progressing Goal: Diagnostic test results will improve Outcome: Progressing Goal: Respiratory complications will improve Outcome: Progressing Goal: Cardiovascular complication will be avoided Outcome: Progressing   Problem: Activity: Goal: Risk for activity intolerance will decrease Outcome: Progressing    Problem: Nutrition: Goal: Adequate nutrition will be maintained Outcome: Progressing   Problem: Coping: Goal: Level of anxiety will decrease Outcome: Progressing   Problem: Elimination: Goal: Will not experience complications related to bowel motility Outcome: Progressing Goal: Will not experience complications related to urinary retention Outcome: Progressing

## 2024-06-24 NOTE — Plan of Care (Signed)
   Problem: Education: Goal: Ability to describe self-care measures that may prevent or decrease complications (Diabetes Survival Skills Education) will improve Outcome: Progressing   Problem: Coping: Goal: Ability to adjust to condition or change in health will improve Outcome: Progressing   Problem: Fluid Volume: Goal: Ability to maintain a balanced intake and output will improve Outcome: Progressing

## 2024-06-24 NOTE — Progress Notes (Signed)
 PROGRESS NOTE    Stephen Jennings  FMW:[id number] DOB: 05-Jul-1956 DOA: 06/23/2024 PCP: Vicci Barnie NOVAK, MD    Brief Narrative:  68 year old with history of COPD and asthma, coronary artery disease status post PCI and LAD stenting, chronic combined heart failure, type 2 diabetes, hypertension woke up with acute respiratory distress.  EMS arrived, started on BiPAP for flash pulmonary edema.  On presentation he was on BiPAP, troponin 138, WC count 22.3.  BNP greater than 1600.  Admitted with diuresis, azithromycin  Rocephin.  Subjective: Patient seen and examined.  Feels much better.  On minimum oxygen.  Walked around.  Denies any chest pain or shortness of breath.  He has some dry cough but never had any fever or chills.  Is for cardiac cath tomorrow. 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  Now on oxygen.  Troponin peaked at 5061 Patient currently on heparin  infusion, remains chest pain-free.  On aspirin  and Brilinta . Currently on IV Lasix  80 mg twice daily with very good response, Jardiance  on hold. Anticipating cardiac cath tomorrow.  Further management after cardiac cath.  Currently not tolerating GDMT.  Cardiology following.  Community-acquired pneumonia: Suspected.  Reasonable to continue antibiotics with cefepime  and azithromycin  given severity of presentation, leukocytosis and lactic acidosis. Blood cultures negative so far.  Respiratory virus panel negative.  Will continue 1 additional day of antibiotics.  Type 2 diabetes with hyperglycemia: Remains on insulin .  Blood sugars adequate today.  AKI: Recently known creatinine of 1.01.  Creatinine 1.34.  Closely monitor on diuresis.  Asthma /COPD: Remains optimized on bronchodilator therapy, Breztri  and Singulair .     DVT prophylaxis: Heparin   infusion   Code Status: Full code Family Communication: None at the bedside Disposition Plan: Status is: Inpatient Remains inpatient appropriate because: On heparin  infusion, cardiac cath planned     Consultants:  Cardiology  Procedures:  None  Antimicrobials:  Cefepime  and azithromycin  11/2--     Objective: Vitals:   06/23/24 1950 06/23/24 2245 06/24/24 0411 06/24/24 0725  BP: 105/76 103/72 93/61 111/79  Pulse: 78 78 67 79  Resp: 19 20 20 20   Temp: 98.1 F (36.7 C) 98.1 F (36.7 C) 98.6 F (37 C) 98.3 F (36.8 C)  TempSrc: Oral Oral Oral Oral  SpO2: 96% 97% 94% 97%  Weight:   91.9 kg   Height:        Intake/Output Summary (Last 24 hours) at 06/24/2024 1158 Last data filed at 06/24/2024 1008 Gross per 24 hour  Intake 726.9 ml  Output 4100 ml  Net -3373.1 ml   Filed Weights   06/23/24 1339 06/24/24 0411  Weight: 93 kg 91.9 kg    Examination:  General exam: Appears calm and comfortable.  Sitting in chair.  Pleasant and interactive. Respiratory system: Clear to auscultation. Respiratory effort normal.  No added sounds. SpO2: 97 % O2 Flow Rate (L/min): 3 L/min FiO2 (%): 40 %  Cardiovascular system: S1 & S2 heard, RRR. No JVD, murmurs, rubs, gallops or clicks. No pedal edema. Gastrointestinal system: Abdomen is nondistended, soft and nontender. No organomegaly or masses felt. Normal bowel sounds heard. Central nervous system: Alert and oriented. No focal neurological deficits.    Data Reviewed: I have personally reviewed following labs and imaging studies  CBC: Recent Labs  Lab 06/23/24 0401 06/23/24 0608 06/24/24 0207  WBC 22.3*  --  14.2*  NEUTROABS  --   --  12.2*  HGB 14.2 15.3 12.7*  HCT 45.3 45.0 38.5*  MCV 94.8  --  91.4  PLT 415*  --  251   Basic Metabolic Panel: Recent Labs  Lab 06/23/24 0401 06/23/24 0608 06/23/24 0621 06/24/24 0207  NA 138 141  --  141  K 4.4 4.2  --  3.7  CL 104  --   --  105  CO2 23  --   --  25  GLUCOSE  360*  --   --  141*  BUN 16  --   --  26*  CREATININE 1.34*  --   --  1.34*  CALCIUM  8.7*  --   --  8.4*  MG  --   --  2.3 2.1  PHOS  --   --  2.0* 4.2   GFR: Estimated Creatinine Clearance: 58.1 mL/min (A) (by C-G formula based on SCr of 1.34 mg/dL (H)). Liver Function Tests: No results for input(s): AST, ALT, ALKPHOS, BILITOT, PROT, ALBUMIN in the last 168 hours. No results for input(s): LIPASE, AMYLASE in the last 168 hours. No results for input(s): AMMONIA in the last 168 hours. Coagulation Profile: No results for input(s): INR, PROTIME in the last 168 hours. Cardiac Enzymes: No results for input(s): CKTOTAL, CKMB, CKMBINDEX, TROPONINI in the last 168 hours. BNP (last 3 results) No results for input(s): PROBNP in the last 8760 hours. HbA1C: Recent Labs    06/23/24 0559  HGBA1C 5.5   CBG: Recent Labs  Lab 06/23/24 1134 06/23/24 1600 06/23/24 2108 06/24/24 0610 06/24/24 1100  GLUCAP 167* 145* 141* 133* 132*   Lipid Profile: No results for input(s): CHOL, HDL, LDLCALC, TRIG, CHOLHDL, LDLDIRECT in the last 72 hours. Thyroid  Function Tests: No results for input(s): TSH, T4TOTAL, FREET4, T3FREE, THYROIDAB in the last 72 hours. Anemia Panel: No results for input(s): VITAMINB12, FOLATE, FERRITIN, TIBC, IRON, RETICCTPCT in the last 72 hours. Sepsis Labs: Recent Labs  Lab 06/23/24 0649 06/23/24 0821 06/23/24 1412  PROCALCITON  --  1.46  --   LATICACIDVEN 3.0*  --  3.0*    Recent Results (from the past 240 hours)  Blood culture (routine x 2)     Status: None (Preliminary result)   Collection Time: 06/23/24  5:29 AM   Specimen: BLOOD LEFT HAND  Result Value Ref Range Status   Specimen Description BLOOD LEFT HAND  Final   Special Requests   Final    BOTTLES DRAWN AEROBIC ONLY Blood Culture adequate volume   Culture   Final    NO GROWTH 1 DAY Performed at [facility], [address]  [place], [place] [address]    Report Status PENDING  Incomplete  Blood culture (routine x 2)     Status: None (Preliminary result)   Collection Time: 06/23/24  5:38 AM   Specimen: BLOOD RIGHT ARM  Result Value Ref Range Status   Specimen Description BLOOD RIGHT ARM  Final   Special Requests   Final    BOTTLES DRAWN AEROBIC AND ANAEROBIC Blood Culture adequate volume   Culture   Final    NO GROWTH 1 DAY Performed at [facility], [address] [place], [place] [address]    Report Status PENDING  Incomplete  Resp panel by RT-PCR (RSV, Flu A&B, Covid) Anterior Nasal Swab     Status: None   Collection Time: 06/23/24  6:49 AM   Specimen: Anterior Nasal Swab  Result  Value Ref Range Status   SARS Coronavirus 2 by RT PCR NEGATIVE NEGATIVE Final   Influenza A by PCR NEGATIVE NEGATIVE Final   Influenza B by PCR NEGATIVE NEGATIVE Final    Comment: (NOTE) The Xpert Xpress SARS-CoV-2/FLU/RSV plus assay is intended as an aid in the diagnosis of influenza from Nasopharyngeal swab specimens and should not be used as a sole basis for treatment. Nasal washings and aspirates are unacceptable for Xpert Xpress SARS-CoV-2/FLU/RSV testing.  Fact Sheet for Patients: bloggercourse.com  Fact Sheet for Healthcare Providers: seriousbroker.it  This test is not yet approved or cleared by the [place]  FDA and has been authorized for detection and/or diagnosis of SARS-CoV-2 by FDA under an Emergency Use Authorization (EUA). This EUA will remain in effect (meaning this test can be used) for the duration of the COVID-19 declaration under Section 564(b)(1) of the Act, 21 U.S.C. section 360bbb-3(b)(1), unless the authorization is terminated or revoked.     Resp Syncytial Virus by PCR NEGATIVE NEGATIVE Final    Comment: (NOTE) Fact Sheet for Patients: bloggercourse.com  Fact Sheet for Healthcare  Providers: seriousbroker.it  This test is not yet approved or cleared by the [place]  FDA and has been authorized for detection and/or diagnosis of SARS-CoV-2 by FDA under an Emergency Use Authorization (EUA). This EUA will remain in effect (meaning this test can be used) for the duration of the COVID-19 declaration under Section 564(b)(1) of the Act, 21 U.S.C. section 360bbb-3(b)(1), unless the authorization is terminated or revoked.  Performed at [facility], [address] [place], [place] [address]   Respiratory (~20 pathogens) panel by PCR     Status: None   Collection Time: 06/23/24  7:58 AM   Specimen: Nasopharyngeal Swab; Respiratory  Result Value Ref Range Status   Adenovirus NOT DETECTED NOT DETECTED Final   Coronavirus 229E NOT DETECTED NOT DETECTED Final    Comment: (NOTE) The Coronavirus on the Respiratory Panel, DOES NOT test for the novel  Coronavirus (2019 nCoV)    Coronavirus HKU1 NOT DETECTED NOT DETECTED Final   Coronavirus NL63 NOT DETECTED NOT DETECTED Final   Coronavirus OC43 NOT DETECTED NOT DETECTED Final   Metapneumovirus NOT DETECTED NOT DETECTED Final   Rhinovirus / Enterovirus NOT DETECTED NOT DETECTED Final   Influenza A NOT DETECTED NOT DETECTED Final   Influenza B NOT DETECTED NOT DETECTED Final   Parainfluenza Virus 1 NOT DETECTED NOT DETECTED Final   Parainfluenza Virus 2 NOT DETECTED NOT DETECTED Final   Parainfluenza Virus 3 NOT DETECTED NOT DETECTED Final   Parainfluenza Virus 4 NOT DETECTED NOT DETECTED Final   Respiratory Syncytial Virus NOT DETECTED NOT DETECTED Final   Bordetella pertussis NOT DETECTED NOT DETECTED Final   Bordetella Parapertussis NOT DETECTED NOT DETECTED Final   Chlamydophila pneumoniae NOT DETECTED NOT DETECTED Final   Mycoplasma pneumoniae NOT DETECTED NOT DETECTED Final    Comment: Performed at [facility], [address], [place], [place] [address]  MRSA Next Gen by  PCR, Nasal     Status: None   Collection Time: 06/23/24  1:46 PM   Specimen: Nasal Mucosa; Nasal Swab  Result Value Ref Range Status   MRSA by PCR Next Gen NOT DETECTED NOT DETECTED Final    Comment: (NOTE) The GeneXpert MRSA Assay (FDA approved for NASAL specimens only), is one component of a comprehensive MRSA colonization surveillance program. It is not intended to diagnose MRSA infection nor to guide or monitor treatment for MRSA  infections. Test performance is not FDA approved in patients less than 62 years old. Performed at [facility], [address] [place], [place] [address]          Radiology Studies: DG Chest Portable 1 View Result Date: 06/23/2024 CLINICAL DATA:  Initial evaluation for acute shortness of breath. EXAM: PORTABLE CHEST 1 VIEW COMPARISON:  Prior radiograph from 07/16/2022. FINDINGS: Diffuse related pad overlies the right chest. Left-sided dual lead transvenous pacemaker/AICD in place with electrodes overlying the right atrium and right ventricle. Transverse heart size at the upper limits of normal. Mediastinal silhouette within normal limits. Lungs normally inflated. Perihilar vascular congestion with diffuse interstitial prominence, consistent with a degree of pulmonary interstitial edema. Superimposed patchy consolidative opacity at the right lower lobe, concerning for pneumonia. Probable trace right pleural effusion. No pneumothorax. Visualized osseous structures and soft tissues demonstrate no acute finding. IMPRESSION: 1. Patchy consolidative right lower lobe opacity, concerning for pneumonia. 2. Underlying diffuse interstitial prominence, consistent with pulmonary interstitial edema. 3. Probable trace right pleural effusion. Electronically Signed   By: Morene Hoard M.D.   On: 06/23/2024 04:47        Scheduled Meds:  aspirin  EC  81 mg Oral Daily   budesonide-glycopyrrolate-formoterol   2 puff Inhalation BID   ezetimibe   10 mg Oral Daily    furosemide   80 mg Intravenous BID   insulin  aspart  0-9 Units Subcutaneous Q4H   insulin  glargine-yfgn  5 Units Subcutaneous Daily   montelukast   10 mg Oral QHS   ticagrelor   90 mg Oral BID   Continuous Infusions:  azithromycin  500 mg (06/24/24 1059)   ceFEPime  (MAXIPIME ) IV 2 g (06/24/24 0920)   heparin  1,600 Units/hr (06/24/24 0837)     LOS: 1 day    Time spent: 51 minutes    Renato Applebaum, MD [facility]

## 2024-06-24 NOTE — Progress Notes (Signed)
 Mobility Specialist Progress Note;   06/24/24 0928  Mobility  Activity Ambulated with assistance;Pivoted/transferred from bed to chair  Level of Assistance Contact guard assist, steadying assist  Assistive Device Other (Comment) (IV pole)  Distance Ambulated (ft) 100 ft  Activity Response Tolerated well  Mobility Referral Yes  Mobility visit 1 Mobility  Mobility Specialist Start Time (ACUTE ONLY) [id number]  Mobility Specialist Stop Time (ACUTE ONLY) [id number]  Mobility Specialist Time Calculation (min) (ACUTE ONLY) 14 min   Pt agreeable to mobility. On 3LO2 upon arrival. Required MinG assistance during ambulation for safety. Attempted ambulation on 3LO2, however pt required 4LO2 to maintain SPO2 92%>. No c/o when asked. Transferred pt to chair at [facility]. Pt left with all needs met, call bell in reach. NT in room.   Lauraine Erm Mobility Specialist Please contact via SecureChat or [employer] [phone]

## 2024-06-24 NOTE — Progress Notes (Signed)
 PHARMACY - ANTICOAGULATION CONSULT NOTE  Pharmacy Consult for Heparin  Indication: chest pain/ACS  Allergies  Allergen Reactions   Atorvastatin  Other (See Comments)    Muscle weakness and fatigue on 80mg  daily   Spironolactone  Other (See Comments)    gynecomastia   Metformin  And Related Anxiety and Palpitations    Patient Measurements: Height: 5' 8 (172.7 cm) Weight: 91.9 kg (202 lb 9.6 oz) IBW/kg (Calculated) : 68.4 HEPARIN  DW (KG): 87.8  Vital Signs: Temp: 98.3 F (36.8 C) (11/03 0725) Temp Source: Oral (11/03 0725) BP: 111/79 (11/03 0725) Pulse Rate: 79 (11/03 0725)  Labs: Recent Labs    06/23/24 0401 06/23/24 0548 06/23/24 0608 06/23/24 0621 06/23/24 1848 06/24/24 0207 06/24/24 1032  HGB 14.2  --  15.3  --   --  12.7*  --   HCT 45.3  --  45.0  --   --  38.5*  --   PLT 415*  --   --   --   --  251  --   HEPARINUNFRC  --   --   --   --  0.14* 0.20* 0.43  CREATININE 1.34*  --   --   --   --  1.34*  --   TROPONINIHS 138* 1,241*  --  5,061*  --   --   --     Estimated Creatinine Clearance: 58.1 mL/min (A) (by C-G formula based on SCr of 1.34 mg/dL (H)).   Medical History: Past Medical History:  Diagnosis Date   Cardiogenic shock (HCC)    CHF (congestive heart failure) (HCC)    Coronary artery disease    Diabetes mellitus without complication (HCC)    Hyperlipidemia    Medical history non-contributory     Medications:  No current facility-administered medications on file prior to encounter.   Current Outpatient Medications on File Prior to Encounter  Medication Sig Dispense Refill   albuterol  (PROVENTIL ) (2.5 MG/3ML) 0.083% nebulizer solution Take 3 mLs (2.5 mg total) by nebulization every 6 (six) hours as needed for wheezing or shortness of breath. 75 mL 12   albuterol  (VENTOLIN  HFA) 108 (90 Base) MCG/ACT inhaler INHALE 2 PUFFS BY MOUTH EVERY 6 HOURS AS NEEDED FOR WHEEZING FOR SHORTNESS OF BREATH 18 g 0   benzonatate  (TESSALON ) 200 MG capsule Take 1  capsule (200 mg total) by mouth 3 (three) times daily as needed. For cough 30 capsule 6   bisoprolol  (ZEBETA ) 5 MG tablet Take 1 tablet (5 mg total) by mouth daily. NEEDS FOLLOW UP APPOINTMENT FOR MORE REFILLS 30 tablet 0   budesonide-glycopyrrolate-formoterol  (BREZTRI  AEROSPHERE) 160-9-4.8 MCG/ACT AERO inhaler Inhale 2 puffs into the lungs 2 (two) times daily. 3 each 4   chlorpheniramine  (CHLOR-TRIMETON ) 4 MG tablet 1 tab tid prn runny nose 40 tablet 0   co-enzyme Q-10 30 MG capsule Take 1 capsule (30 mg total) by mouth daily. 90 capsule 3   eplerenone  (INSPRA ) 25 MG tablet Take 0.5 tablets (12.5 mg total) by mouth daily. PLEASE SCHEDULE APPOINTMENT FOR MORE REFILLS 45 tablet 0   Evolocumab  (REPATHA  SURECLICK) 140 MG/ML SOAJ Inject 140 mg into the skin every 14 (fourteen) days. 6 mL 3   ezetimibe  (ZETIA ) 10 MG tablet Take 1 tablet (10 mg total) by mouth daily. 90 tablet 3   fluticasone  (FLONASE ) 50 MCG/ACT nasal spray USE 1 SPRAY IN EACH NOSTRIL ONCE DAILY AS NEEDED FOR ALLERGIES OR RHINITIS 48 g 0   furosemide  (LASIX ) 20 MG tablet Take 2 tablets (40 mg total) by mouth  daily. (Patient taking differently: Take 20 mg by mouth daily. Takes one 20 mgh tablet daily) 180 tablet 3   Hyprom-Naphaz-Polysorb-Zn Sulf (CLEAR EYES COMPLETE) SOLN Apply 1 drop to eye 3 (three) times daily. 15 mL 1   JARDIANCE  10 MG TABS tablet Take 1 tablet (10 mg total) by mouth daily. Must have office visit for refills 30 tablet 0   loratadine  (CLARITIN ) 10 MG tablet Take 1 tablet by mouth once daily 90 tablet 3   melatonin 1 MG TABS tablet Take 1 tablet (1 mg total) by mouth at bedtime. 30 tablet 1   montelukast  (SINGULAIR ) 10 MG tablet TAKE ONE TABLET BY MOUTH DAILY AT BEDTIME 30 tablet 0   nitroGLYCERIN  (NITROSTAT ) 0.4 MG SL tablet Place 1 tablet (0.4 mg total) under the tongue every 5 (five) minutes x 3 doses as needed for chest pain. 25 tablet 3   omeprazole  (PRILOSEC) 20 MG capsule Take 1 capsule (20 mg total) by mouth  daily. 90 capsule 1   potassium chloride  SA (KLOR-CON  M20) 20 MEQ tablet On the first day take 60meq (3 tablets) wait 4 hours then take another 60meq (3 tablets) by mouth. THEN THURSDAY 8/29 start 2 tablets by mouth 2 times daily. 90 tablet 3   pravastatin  (PRAVACHOL ) 80 MG tablet Take 1 tablet (80 mg total) by mouth every evening. 90 tablet 3   sacubitril -valsartan  (ENTRESTO ) 97-103 MG Take 1 tablet by mouth 2 (two) times daily. 180 tablet 2   ticagrelor  (BRILINTA ) 90 MG TABS tablet Take 1 tablet (90 mg total) by mouth 2 (two) times daily. 60 tablet 11   traZODone  (DESYREL ) 50 MG tablet TAKE HALF TABLET BY MOUTH AT BEDTIME 30 tablet 1     Assessment: 68 y.o. male with CHF and elevated troponin, possible ACS, for heparin . No AC PTA.  Heparin  level came back therapeutic today. Plan for cath in AM.   Goal of Therapy:  Heparin  level 0.3-0.7 units/ml Monitor platelets by anticoagulation protocol: Yes   Plan:  Cont heparin  infusion at 1600 units/hr  F/u after cath tomorrow Monitor daily HL, CBC, and for s/sx of bleeding   Sergio Batch, PharmD, BCIDP, AAHIVP, CPP Infectious Disease Pharmacist 06/24/2024 11:24 AM

## 2024-06-24 NOTE — Progress Notes (Signed)
 Progress Note  Patient Name: Stephen Jennings Date of Encounter: 06/24/2024  Primary Cardiologist: Debby Sor, MD (Inactive)   Subjective   Patient seen examined his bedside.  His only when I arrived.  No specific complaints at this time.  Inpatient Medications    Scheduled Meds:  aspirin  EC  81 mg Oral Daily   budesonide-glycopyrrolate-formoterol   2 puff Inhalation BID   ezetimibe   10 mg Oral Daily   furosemide   80 mg Intravenous BID   insulin  aspart  0-9 Units Subcutaneous Q4H   insulin  glargine-yfgn  5 Units Subcutaneous Daily   montelukast   10 mg Oral QHS   ticagrelor   90 mg Oral BID   Continuous Infusions:  azithromycin      ceFEPime  (MAXIPIME ) IV 2 g (06/23/24 2155)   heparin  1,600 Units/hr (06/24/24 0837)   PRN Meds: acetaminophen , ipratropium-albuterol , melatonin, nitroGLYCERIN , polyethylene glycol, prochlorperazine   Vital Signs    Vitals:   06/23/24 1950 06/23/24 2245 06/24/24 0411 06/24/24 0725  BP: 105/76 103/72 93/61 111/79  Pulse: 78 78 67 79  Resp: 19 20 20 20   Temp: 98.1 F (36.7 C) 98.1 F (36.7 C) 98.6 F (37 C) 98.3 F (36.8 C)  TempSrc: Oral Oral Oral Oral  SpO2: 96% 97% 94% 97%  Weight:   91.9 kg   Height:        Intake/Output Summary (Last 24 hours) at 06/24/2024 0858 Last data filed at 06/24/2024 0448 Gross per 24 hour  Intake 486.9 ml  Output 5250 ml  Net -4763.1 ml   Filed Weights   06/23/24 1339 06/24/24 0411  Weight: 93 kg 91.9 kg    Telemetry     - Personally Reviewed  ECG     - Personally Reviewed  Physical Exam     General: Comfortable in bed- awake Head: Atraumatic, normal size  Eyes: PEERLA, EOMI  Neck: Supple, normal JVD Cardiac: Normal S1, S2; RRR; no murmurs, rubs, or gallops Lungs: Clear to auscultation bilaterally Abd: Soft, nontender, no hepatomegaly  Ext: warm, no edema Musculoskeletal: No deformities, BUE and BLE strength normal and equal Skin: Warm and dry, no rashes   Neuro: Alert and oriented  to person, place, time, and situation, CNII-XII grossly intact, no focal deficits  Psych: Normal mood and affect   Labs    Chemistry Recent Labs  Lab 06/23/24 0401 06/23/24 0608 06/24/24 0207  NA 138 141 141  K 4.4 4.2 3.7  CL 104  --  105  CO2 23  --  25  GLUCOSE 360*  --  141*  BUN 16  --  26*  CREATININE 1.34*  --  1.34*  CALCIUM  8.7*  --  8.4*  GFRNONAA 58*  --  58*  ANIONGAP 11  --  11     Hematology Recent Labs  Lab 06/23/24 0401 06/23/24 0608 06/24/24 0207  WBC 22.3*  --  14.2*  RBC 4.78  --  4.21*  HGB 14.2 15.3 12.7*  HCT 45.3 45.0 38.5*  MCV 94.8  --  91.4  MCH 29.7  --  30.2  MCHC 31.3  --  33.0  RDW 13.9  --  14.1  PLT 415*  --  251    Cardiac EnzymesNo results for input(s): TROPONINI in the last 168 hours. No results for input(s): TROPIPOC in the last 168 hours.   BNP Recent Labs  Lab 06/23/24 0401  BNP 1,604.7*     DDimer No results for input(s): DDIMER in the last 168 hours.  Radiology    DG Chest Portable 1 View Result Date: 06/23/2024 CLINICAL DATA:  Initial evaluation for acute shortness of breath. EXAM: PORTABLE CHEST 1 VIEW COMPARISON:  Prior radiograph from 07/16/2022. FINDINGS: Diffuse related pad overlies the right chest. Left-sided dual lead transvenous pacemaker/AICD in place with electrodes overlying the right atrium and right ventricle. Transverse heart size at the upper limits of normal. Mediastinal silhouette within normal limits. Lungs normally inflated. Perihilar vascular congestion with diffuse interstitial prominence, consistent with a degree of pulmonary interstitial edema. Superimposed patchy consolidative opacity at the right lower lobe, concerning for pneumonia. Probable trace right pleural effusion. No pneumothorax. Visualized osseous structures and soft tissues demonstrate no acute finding. IMPRESSION: 1. Patchy consolidative right lower lobe opacity, concerning for pneumonia. 2. Underlying diffuse interstitial  prominence, consistent with pulmonary interstitial edema. 3. Probable trace right pleural effusion. Electronically Signed   By: Morene Hoard M.D.   On: 06/23/2024 04:47    Cardiac Studies     Patient Profile     68 y.o. male   Assessment & Plan    Acute on chronic heart failure with midrange ejection fraction CAD s/p PCI Ischemic cardiomyopathy EF 40 to 45%  Type II NSTEMI Hx of [place] s/p ICD Community-acquired pneumonia  His troponin peaked to 5061, he is currently on heparin  drip we will continue this for now.  With the trending upward of his troponin it will be beneficial to see with an ischemic evaluation in this patient.  He is agreeable.  A right and left heart catheterization will be ideal here.  If not today highly likely tomorrow.  The patient understands that risks include but are not limited to stroke (1 in 1000), death (1 in 1000), kidney failure [usually temporary] (1 in 500), bleeding (1 in 200), allergic reaction [possibly serious] (1 in 200), and agrees to proceed.  For now continue heparin  drip, continue dual antiplatelet therapy, continue statin  In terms of his heart failure he has responded well to the Lasix  we will continue Lasix  80 mg IV twice a day.  Total output is-4763 milliliters.  His blood pressure is marginal so very hard to restart his guideline directed medical therapy.  Jardiance  has been held in the plans for upcoming heart catheterization.  Will monitor electrolytes.  Known chronic kidney disease, will monitor creatinine on diuretics and post cath.   For questions or updates, please contact [facility] Please consult [email or web address] for contact info under Cardiology/STEMI.      Signed, Waino Mounsey, DO  06/24/2024, 8:58 AM

## 2024-06-24 NOTE — Progress Notes (Signed)
  Echocardiogram 2D Echocardiogram has been performed.  Devora Ellouise SAUNDERS 06/24/2024, 2:46 PM

## 2024-06-24 NOTE — Progress Notes (Signed)
 PHARMACY - ANTICOAGULATION  Pharmacy Consult for Heparin  Indication: chest pain/ACS Brief A/P: Heparin  level subtherapeutic Increase Heparin  rate  Allergies  Allergen Reactions   Atorvastatin      Muscle weakness and fatigue on 80mg  daily   Spironolactone      gynecomastia   Metformin  And Related Anxiety and Palpitations   Other Anxiety and Palpitations    Patient Measurements: Height: 5' 8 (172.7 cm) Weight: 93 kg (205 lb 1.6 oz) IBW/kg (Calculated) : 68.4 HEPARIN  DW (KG): 87.8  Vital Signs: Temp: 98.1 F (36.7 C) (11/02 2245) Temp Source: Oral (11/02 2245) BP: 103/72 (11/02 2245) Pulse Rate: 78 (11/02 2245)  Labs: Recent Labs    06/23/24 0401 06/23/24 0548 06/23/24 0608 06/23/24 0621 06/23/24 1848 06/24/24 0207  HGB 14.2  --  15.3  --   --  12.7*  HCT 45.3  --  45.0  --   --  38.5*  PLT 415*  --   --   --   --  251  HEPARINUNFRC  --   --   --   --  0.14* 0.20*  CREATININE 1.34*  --   --   --   --  1.34*  TROPONINIHS 138* 1,241*  --  5,061*  --   --     Estimated Creatinine Clearance: 58.4 mL/min (A) (by C-G formula based on SCr of 1.34 mg/dL (H)).  Assessment: 68 y.o. male with CHF and elevated troponin, for heparin  Goal of Therapy:  Heparin  level 0.3-0.7 units/ml Monitor platelets by anticoagulation protocol: Yes   Plan:  Heparin  2000 units IV bolus, then increase heparin  1600 units/hr Check heparin  level in 6 hours.    Stephen Jennings 06/24/2024,3:06 AM

## 2024-06-24 NOTE — TOC CM/SW Note (Signed)
 Transition of Care [facility]) - Inpatient Brief Assessment   Patient Details  Name: Toby Breithaupt MRN: [id number] Date of Birth: 25-Mar-1956  Transition of Care [facility]) CM/SW Contact:    Lauraine FORBES Saa, LCSWA Phone Number: 06/24/2024, 9:46 AM   Clinical Narrative:  9:46 AM Per chart review, patient resides at home with spouse. Patient has a PCP and insurance. Patient does not have SNF history. Patient has HH history with [facility]. Patient has DME (nebulizer, BSC, RW) history with [employer]. Patient's preferred pharmacy's are [facility] [place], [facility], [facility] [id number], and [id number]. No TOC needs identified at this time. TOC will continue to follow.  Transition of Care Asessment: Insurance and Status: Insurance coverage has been reviewed Patient has primary care physician: Yes Home environment has been reviewed: Private Residence Prior level of function:: N/A Prior/Current Home Services: No current home services Social Drivers of Health Review: SDOH reviewed no interventions necessary Readmission risk has been reviewed: Yes (Currently Yellow 21%) Transition of care needs: no transition of care needs at this time

## 2024-06-25 ENCOUNTER — Encounter (HOSPITAL_COMMUNITY): Payer: Self-pay | Admitting: Cardiology

## 2024-06-25 ENCOUNTER — Inpatient Hospital Stay (HOSPITAL_COMMUNITY): Admission: EM | Disposition: A | Payer: Self-pay | Source: Home / Self Care | Attending: Student

## 2024-06-25 DIAGNOSIS — I5023 Acute on chronic systolic (congestive) heart failure: Secondary | ICD-10-CM | POA: Diagnosis not present

## 2024-06-25 DIAGNOSIS — I2510 Atherosclerotic heart disease of native coronary artery without angina pectoris: Secondary | ICD-10-CM | POA: Diagnosis not present

## 2024-06-25 DIAGNOSIS — I5020 Unspecified systolic (congestive) heart failure: Secondary | ICD-10-CM | POA: Diagnosis not present

## 2024-06-25 HISTORY — PX: RIGHT/LEFT HEART CATH AND CORONARY ANGIOGRAPHY: CATH118266

## 2024-06-25 LAB — GLUCOSE, CAPILLARY
Glucose-Capillary: 147 mg/dL — ABNORMAL HIGH (ref 70–99)
Glucose-Capillary: 127 mg/dL — ABNORMAL HIGH (ref 70–99)
Glucose-Capillary: 108 mg/dL — ABNORMAL HIGH (ref 70–99)
Glucose-Capillary: 130 mg/dL — ABNORMAL HIGH (ref 70–99)

## 2024-06-25 LAB — POCT I-STAT EG7
Acid-Base Excess: 6 mmol/L — ABNORMAL HIGH (ref 0.0–2.0)
Acid-Base Excess: 6 mmol/L — ABNORMAL HIGH (ref 0.0–2.0)
Bicarbonate: 30.9 mmol/L — ABNORMAL HIGH (ref 20.0–28.0)
Bicarbonate: 31.3 mmol/L — ABNORMAL HIGH (ref 20.0–28.0)
Calcium, Ion: 1.15 mmol/L (ref 1.15–1.40)
Calcium, Ion: 1.16 mmol/L (ref 1.15–1.40)
HCT: 47 % (ref 39.0–52.0)
HCT: 47 % (ref 39.0–52.0)
Hemoglobin: 16 g/dL (ref 13.0–17.0)
Hemoglobin: 16 g/dL (ref 13.0–17.0)
O2 Saturation: 65 %
O2 Saturation: 69 %
Potassium: 3.5 mmol/L (ref 3.5–5.1)
Potassium: 3.6 mmol/L (ref 3.5–5.1)
Sodium: 142 mmol/L (ref 135–145)
Sodium: 142 mmol/L (ref 135–145)
TCO2: 32 mmol/L (ref 22–32)
TCO2: 33 mmol/L — ABNORMAL HIGH (ref 22–32)
pCO2, Ven: 44.2 mmHg (ref 44–60)
pCO2, Ven: 45 mmHg (ref 44–60)
pH, Ven: 7.45 — ABNORMAL HIGH (ref 7.25–7.43)
pH, Ven: 7.453 — ABNORMAL HIGH (ref 7.25–7.43)
pO2, Ven: 33 mmHg (ref 32–45)
pO2, Ven: 35 mmHg (ref 32–45)

## 2024-06-25 LAB — HEPARIN LEVEL (UNFRACTIONATED): Heparin Unfractionated: 0.46 [IU]/mL (ref 0.30–0.70)

## 2024-06-25 LAB — CBC
HCT: 43.9 % (ref 39.0–52.0)
Hemoglobin: 14.5 g/dL (ref 13.0–17.0)
MCH: 29.9 pg (ref 26.0–34.0)
MCHC: 33 g/dL (ref 30.0–36.0)
MCV: 90.5 fL (ref 80.0–100.0)
Platelets: 264 K/uL (ref 150–400)
RBC: 4.85 MIL/uL (ref 4.22–5.81)
RDW: 14.2 % (ref 11.5–15.5)
WBC: 10.1 K/uL (ref 4.0–10.5)
nRBC: 0 % (ref 0.0–0.2)

## 2024-06-25 LAB — BASIC METABOLIC PANEL WITH GFR
Anion gap: 18 — ABNORMAL HIGH (ref 5–15)
BUN: 29 mg/dL — ABNORMAL HIGH (ref 8–23)
CO2: 27 mmol/L (ref 22–32)
Calcium: 9.1 mg/dL (ref 8.9–10.3)
Chloride: 99 mmol/L (ref 98–111)
Creatinine, Ser: 1.17 mg/dL (ref 0.61–1.24)
GFR, Estimated: >60 mL/min (ref >60)
Glucose, Bld: 110 mg/dL — ABNORMAL HIGH (ref 70–99)
Potassium: 3.5 mmol/L (ref 3.5–5.1)
Sodium: 144 mmol/L (ref 135–145)

## 2024-06-25 LAB — POCT I-STAT 7, (LYTES, BLD GAS, ICA,H+H)
Acid-Base Excess: 5 mmol/L — ABNORMAL HIGH (ref 0.0–2.0)
Bicarbonate: 29.1 mmol/L — ABNORMAL HIGH (ref 20.0–28.0)
Calcium, Ion: 1.15 mmol/L (ref 1.15–1.40)
HCT: 47 % (ref 39.0–52.0)
Hemoglobin: 16 g/dL (ref 13.0–17.0)
O2 Saturation: 95 %
Potassium: 3.5 mmol/L (ref 3.5–5.1)
Sodium: 142 mmol/L (ref 135–145)
TCO2: 30 mmol/L (ref 22–32)
pCO2 arterial: 39.7 mmHg (ref 32–48)
pH, Arterial: 7.473 — ABNORMAL HIGH (ref 7.35–7.45)
pO2, Arterial: 70 mmHg — ABNORMAL LOW (ref 83–108)

## 2024-06-25 SURGERY — RIGHT/LEFT HEART CATH AND CORONARY ANGIOGRAPHY
Anesthesia: LOCAL

## 2024-06-25 MED ORDER — LIDOCAINE HCL (PF) 1 % IJ SOLN
INTRAMUSCULAR | Status: DC | PRN
  Administered 2024-06-25 (×2): 2 mL

## 2024-06-25 MED ORDER — HEPARIN SODIUM (PORCINE) 1000 UNIT/ML IJ SOLN
INTRAMUSCULAR | Status: AC
  Filled 2024-06-25: qty 10

## 2024-06-25 MED ORDER — VERAPAMIL HCL 2.5 MG/ML IV SOLN
INTRAVENOUS | Status: DC | PRN
  Administered 2024-06-25: 10 mL via INTRA_ARTERIAL

## 2024-06-25 MED ORDER — VERAPAMIL HCL 2.5 MG/ML IV SOLN
INTRAVENOUS | Status: AC
  Filled 2024-06-25: qty 2

## 2024-06-25 MED ORDER — HEPARIN (PORCINE) IN NACL 1000-0.9 UT/500ML-% IV SOLN
INTRAVENOUS | Status: DC | PRN
Start: 2024-06-25 — End: 2024-06-25
  Administered 2024-06-25: 1000 mL

## 2024-06-25 MED ORDER — MIDAZOLAM HCL (PF) 2 MG/2ML IJ SOLN
INTRAMUSCULAR | Status: DC | PRN
  Administered 2024-06-25: 1 mg via INTRAVENOUS

## 2024-06-25 MED ORDER — SODIUM CHLORIDE 0.9% FLUSH: 3.0000 mL | INTRAVENOUS | Status: DC | PRN

## 2024-06-25 MED ORDER — SODIUM CHLORIDE 0.9% FLUSH
3.0000 mL | Freq: Two times a day (BID) | INTRAVENOUS | Status: DC
  Administered 2024-06-25 – 2024-06-26 (×2): 3 mL via INTRAVENOUS

## 2024-06-25 MED ORDER — HEPARIN SODIUM (PORCINE) 1000 UNIT/ML IJ SOLN
INTRAMUSCULAR | Status: DC | PRN
  Administered 2024-06-25: 5000 [IU] via INTRAVENOUS

## 2024-06-25 MED ORDER — MIDAZOLAM HCL 2 MG/2ML IJ SOLN
INTRAMUSCULAR | Status: AC
  Filled 2024-06-25: qty 2

## 2024-06-25 MED ORDER — FENTANYL CITRATE (PF) 100 MCG/2ML IJ SOLN
INTRAMUSCULAR | Status: AC
  Filled 2024-06-25: qty 2

## 2024-06-25 MED ORDER — FENTANYL CITRATE (PF) 100 MCG/2ML IJ SOLN
INTRAMUSCULAR | Status: DC | PRN
  Administered 2024-06-25: 25 ug via INTRAVENOUS

## 2024-06-25 MED ORDER — SODIUM CHLORIDE 0.9 % IV SOLN: 250.0000 mL | INTRAVENOUS | Status: DC | PRN

## 2024-06-25 MED ORDER — ENOXAPARIN SODIUM 40 MG/0.4ML IJ SOSY
40.0000 mg | PREFILLED_SYRINGE | Freq: Every day | INTRAMUSCULAR | Status: DC
  Administered 2024-06-26: 40 mg via SUBCUTANEOUS
  Filled 2024-06-25: qty 0.4

## 2024-06-25 MED ORDER — LABETALOL HCL 5 MG/ML IV SOLN: 10.0000 mg | INTRAVENOUS | Status: AC | PRN

## 2024-06-25 MED ORDER — FREE WATER
500.0000 mL | Freq: Once | Status: AC
  Administered 2024-06-25: 500 mL via ORAL

## 2024-06-25 MED ORDER — IOHEXOL 350 MG/ML SOLN
INTRAVENOUS | Status: DC | PRN
Start: 2024-06-25 — End: 2024-06-25
  Administered 2024-06-25: 30 mL

## 2024-06-25 MED ORDER — INSULIN ASPART 100 UNIT/ML IJ SOLN
0.0000 [IU] | Freq: Three times a day (TID) | INTRAMUSCULAR | Status: DC
  Administered 2024-06-25: 1 [IU] via SUBCUTANEOUS
  Administered 2024-06-26: 2 [IU] via SUBCUTANEOUS
  Filled 2024-06-25: qty 2
  Filled 2024-06-25: qty 1

## 2024-06-25 MED ORDER — LIDOCAINE HCL (PF) 1 % IJ SOLN
INTRAMUSCULAR | Status: AC
  Filled 2024-06-25: qty 30

## 2024-06-25 MED ORDER — POTASSIUM CHLORIDE CRYS ER 20 MEQ PO TBCR
40.0000 meq | EXTENDED_RELEASE_TABLET | Freq: Two times a day (BID) | ORAL | Status: DC
Start: 2024-06-25 — End: 2024-06-27
  Administered 2024-06-25 – 2024-06-26 (×3): 40 meq via ORAL
  Filled 2024-06-25 (×3): qty 2

## 2024-06-25 MED ORDER — HYDRALAZINE HCL 20 MG/ML IJ SOLN: 10.0000 mg | INTRAMUSCULAR | Status: AC | PRN

## 2024-06-25 SURGICAL SUPPLY — 10 items
CATH BALLN WEDGE 5F 110CM (CATHETERS) IMPLANT
CATH INFINITI AMBI 5FR TG (CATHETERS) IMPLANT
DEVICE RAD COMP TR BAND LRG (VASCULAR PRODUCTS) IMPLANT
GLIDESHEATH SLEND A-KIT 6F 22G (SHEATH) IMPLANT
GUIDEWIRE INQWIRE 1.5J.035X260 (WIRE) IMPLANT
PACK CARDIAC CATHETERIZATION (CUSTOM PROCEDURE TRAY) ×2 IMPLANT
SET ATX-X65L (MISCELLANEOUS) IMPLANT
SHEATH GLIDE SLENDER 4/5FR (SHEATH) IMPLANT
WIRE EMERALD 3MM-J .025X260CM (WIRE) IMPLANT
WIRE MICROINTRODUCER 60CM (WIRE) IMPLANT

## 2024-06-25 NOTE — Interval H&P Note (Signed)
 History and Physical Interval Note:  06/25/2024 3:54 PM  Stephen Jennings  has presented today for surgery, with the diagnosis of nstemi.  The various methods of treatment have been discussed with the patient and family. After consideration of risks, benefits and other options for treatment, the patient has consented to  Procedure(s): RIGHT/LEFT HEART CATH AND CORONARY ANGIOGRAPHY (N/A) as a surgical intervention.  The patient's history has been reviewed, patient examined, no change in status, stable for surgery.  I have reviewed the patient's chart and labs.  Questions were answered to the patient's satisfaction.     Petra Sargeant J Breniya Goertzen

## 2024-06-25 NOTE — Progress Notes (Signed)
 PROGRESS NOTE    Stephen Jennings  FMW:[id number] DOB: 25-Feb-1956 DOA: 06/23/2024 PCP: Vicci Barnie NOVAK, MD    Brief Narrative:  68 year old with history of COPD and asthma, coronary artery disease status post PCI and LAD stenting, chronic combined heart failure, type 2 diabetes, hypertension woke up with acute respiratory distress.  EMS arrived, started on BiPAP for flash pulmonary edema.  On presentation he was on BiPAP, troponin 138, WC count 22.3.  BNP greater than 1600.  Admitted with diuresis, azithromycin  Rocephin.  Subjective:  Patient seen and examined.  Sister at bedside.  Patient for cardiac cath today.  Denies any complaints.  Improved shortness of breath.  He is walking around in the hallway without oxygen. No fever, chills or cough or sputum production.   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  Now on oxygen.  Troponin peaked at 5061 Patient currently on heparin  infusion, remains chest pain-free.  On aspirin  and Brilinta . Currently on IV Lasix  80 mg twice daily with very good response, Jardiance  on hold. Anticipating cardiac cath today.  Further management after cardiac cath.  Currently not tolerating GDMT.  Cardiology following.  Community-acquired pneumonia: Suspected.  Ruled out.  WBC normalized.  Does not have any evidence of bacterial infection.  Cultures negative.  Respiratory virus panel negative.  On antibiotics with cefepime  and azithromycin  day 3 today.  Will discontinue antibiotics and monitor.    Type 2 diabetes with hyperglycemia: Remains on insulin .  Blood sugars adequate today.  AKI: Recently known creatinine of 1.01.  Creatinine 1.34-1.17.  Closely monitor on diuresis.  Asthma /COPD: Remains optimized on bronchodilator therapy, Breztri  and Singulair .     DVT  prophylaxis: Heparin  infusion   Code Status: Full code Family Communication: Sister at the bedside Disposition Plan: Status is: Inpatient Remains inpatient appropriate because: On heparin  infusion, cardiac cath planned     Consultants:  Cardiology  Procedures:  None  Antimicrobials:  Cefepime  and azithromycin  11/2--11/4     Objective: Vitals:   06/25/24 0005 06/25/24 0600 06/25/24 0733 06/25/24 1112  BP: 108/79 129/87 119/78 118/87  Pulse: 64 71 67 90  Resp: 17 (!) 25 13 18   Temp: 98 F (36.7 C) 97.7 F (36.5 C) 98.1 F (36.7 C) 97.9 F (36.6 C)  TempSrc: Oral Oral Oral Oral  SpO2: 94% 94% 96% 91%  Weight:  84.4 kg    Height:        Intake/Output Summary (Last 24 hours) at 06/25/2024 1115 Last data filed at 06/25/2024 1026 Gross per 24 hour  Intake 1083.33 ml  Output 3800 ml  Net -2716.67 ml   Filed Weights   06/23/24 1339 06/24/24 0411 06/25/24 0600  Weight: 93 kg 91.9 kg 84.4 kg    Examination:  General exam: Appears calm and comfortable.  Walking in the hallway.  Mostly on room air. Respiratory system: Clear to auscultation. Respiratory effort normal.  No added sounds. SpO2: 91 % O2 Flow Rate (L/min): 2 L/min FiO2 (%): 40 %  Cardiovascular system: S1 & S2 heard, RRR. No JVD, murmurs, rubs, gallops or clicks. No pedal edema. Gastrointestinal system: Abdomen is nondistended, soft and nontender. No organomegaly or masses felt. Normal bowel sounds heard. Central nervous system: Alert and oriented. No focal neurological deficits.    Data Reviewed: I have personally reviewed following labs and imaging studies  CBC:  Recent Labs  Lab 06/23/24 0401 06/23/24 0608 06/24/24 0207 06/25/24 0220  WBC 22.3*  --  14.2* 10.1  NEUTROABS  --   --  12.2*  --   HGB 14.2 15.3 12.7* 14.5  HCT 45.3 45.0 38.5* 43.9  MCV 94.8  --  91.4 90.5  PLT 415*  --  251 264   Basic Metabolic Panel: Recent Labs  Lab 06/23/24 0401 06/23/24 0608 06/23/24 0621  06/24/24 0207 06/25/24 0617  NA 138 141  --  141 144  K 4.4 4.2  --  3.7 3.5  CL 104  --   --  105 99  CO2 23  --   --  25 27  GLUCOSE 360*  --   --  141* 110*  BUN 16  --   --  26* 29*  CREATININE 1.34*  --   --  1.34* 1.17  CALCIUM  8.7*  --   --  8.4* 9.1  MG  --   --  2.3 2.1  --   PHOS  --   --  2.0* 4.2  --    GFR: Estimated Creatinine Clearance: 63.9 mL/min (by C-G formula based on SCr of 1.17 mg/dL). Liver Function Tests: No results for input(s): AST, ALT, ALKPHOS, BILITOT, PROT, ALBUMIN in the last 168 hours. No results for input(s): LIPASE, AMYLASE in the last 168 hours. No results for input(s): AMMONIA in the last 168 hours. Coagulation Profile: No results for input(s): INR, PROTIME in the last 168 hours. Cardiac Enzymes: No results for input(s): CKTOTAL, CKMB, CKMBINDEX, TROPONINI in the last 168 hours. BNP (last 3 results) No results for input(s): PROBNP in the last 8760 hours. HbA1C: Recent Labs    06/23/24 0559  HGBA1C 5.5   CBG: Recent Labs  Lab 06/24/24 0610 06/24/24 1100 06/24/24 1614 06/24/24 2058 06/25/24 0557  GLUCAP 133* 132* 94 113* 147*   Lipid Profile: No results for input(s): CHOL, HDL, LDLCALC, TRIG, CHOLHDL, LDLDIRECT in the last 72 hours. Thyroid  Function Tests: No results for input(s): TSH, T4TOTAL, FREET4, T3FREE, THYROIDAB in the last 72 hours. Anemia Panel: No results for input(s): VITAMINB12, FOLATE, FERRITIN, TIBC, IRON, RETICCTPCT in the last 72 hours. Sepsis Labs: Recent Labs  Lab 06/23/24 0649 06/23/24 0821 06/23/24 1412  PROCALCITON  --  1.46  --   LATICACIDVEN 3.0*  --  3.0*    Recent Results (from the past 240 hours)  Blood culture (routine x 2)     Status: None (Preliminary result)   Collection Time: 06/23/24  5:29 AM   Specimen: BLOOD LEFT HAND  Result Value Ref Range Status   Specimen Description BLOOD LEFT HAND  Final   Special Requests   Final     BOTTLES DRAWN AEROBIC ONLY Blood Culture adequate volume   Culture   Final    NO GROWTH 2 DAYS Performed at [facility], [address], [place], [place] [address]    Report Status PENDING  Incomplete  Blood culture (routine x 2)     Status: None (Preliminary result)   Collection Time: 06/23/24  5:38 AM   Specimen: BLOOD RIGHT ARM  Result Value Ref Range Status   Specimen Description BLOOD RIGHT ARM  Final   Special Requests   Final    BOTTLES DRAWN AEROBIC AND ANAEROBIC Blood Culture adequate volume   Culture   Final    NO GROWTH 2 DAYS Performed at [facility], [address], [place], [place] [address]  Report Status PENDING  Incomplete  Resp panel by RT-PCR (RSV, Flu A&B, Covid) Anterior Nasal Swab     Status: None   Collection Time: 06/23/24  6:49 AM   Specimen: Anterior Nasal Swab  Result Value Ref Range Status   SARS Coronavirus 2 by RT PCR NEGATIVE NEGATIVE Final   Influenza A by PCR NEGATIVE NEGATIVE Final   Influenza B by PCR NEGATIVE NEGATIVE Final    Comment: (NOTE) The Xpert Xpress SARS-CoV-2/FLU/RSV plus assay is intended as an aid in the diagnosis of influenza from Nasopharyngeal swab specimens and should not be used as a sole basis for treatment. Nasal washings and aspirates are unacceptable for Xpert Xpress SARS-CoV-2/FLU/RSV testing.  Fact Sheet for Patients: bloggercourse.com  Fact Sheet for Healthcare Providers: seriousbroker.it  This test is not yet approved or cleared by the [place]  FDA and has been authorized for detection and/or diagnosis of SARS-CoV-2 by FDA under an Emergency Use Authorization (EUA). This EUA will remain in effect (meaning this test can be used) for the duration of the COVID-19 declaration under Section 564(b)(1) of the Act, 21 U.S.C. section 360bbb-3(b)(1), unless the authorization is terminated or revoked.     Resp Syncytial Virus by PCR NEGATIVE  NEGATIVE Final    Comment: (NOTE) Fact Sheet for Patients: bloggercourse.com  Fact Sheet for Healthcare Providers: seriousbroker.it  This test is not yet approved or cleared by the [place]  FDA and has been authorized for detection and/or diagnosis of SARS-CoV-2 by FDA under an Emergency Use Authorization (EUA). This EUA will remain in effect (meaning this test can be used) for the duration of the COVID-19 declaration under Section 564(b)(1) of the Act, 21 U.S.C. section 360bbb-3(b)(1), unless the authorization is terminated or revoked.  Performed at [facility], [address], [place], [place] [address]   Respiratory (~20 pathogens) panel by PCR     Status: None   Collection Time: 06/23/24  7:58 AM   Specimen: Nasopharyngeal Swab; Respiratory  Result Value Ref Range Status   Adenovirus NOT DETECTED NOT DETECTED Final   Coronavirus 229E NOT DETECTED NOT DETECTED Final    Comment: (NOTE) The Coronavirus on the Respiratory Panel, DOES NOT test for the novel  Coronavirus (2019 nCoV)    Coronavirus HKU1 NOT DETECTED NOT DETECTED Final   Coronavirus NL63 NOT DETECTED NOT DETECTED Final   Coronavirus OC43 NOT DETECTED NOT DETECTED Final   Metapneumovirus NOT DETECTED NOT DETECTED Final   Rhinovirus / Enterovirus NOT DETECTED NOT DETECTED Final   Influenza A NOT DETECTED NOT DETECTED Final   Influenza B NOT DETECTED NOT DETECTED Final   Parainfluenza Virus 1 NOT DETECTED NOT DETECTED Final   Parainfluenza Virus 2 NOT DETECTED NOT DETECTED Final   Parainfluenza Virus 3 NOT DETECTED NOT DETECTED Final   Parainfluenza Virus 4 NOT DETECTED NOT DETECTED Final   Respiratory Syncytial Virus NOT DETECTED NOT DETECTED Final   Bordetella pertussis NOT DETECTED NOT DETECTED Final   Bordetella Parapertussis NOT DETECTED NOT DETECTED Final   Chlamydophila pneumoniae NOT DETECTED NOT DETECTED Final   Mycoplasma pneumoniae NOT  DETECTED NOT DETECTED Final    Comment: Performed at [facility], [address], [place], [place] [address]  MRSA Next Gen by PCR, Nasal     Status: None   Collection Time: 06/23/24  1:46 PM   Specimen: Nasal Mucosa; Nasal Swab  Result Value Ref Range Status   MRSA by PCR Next Gen NOT DETECTED NOT DETECTED Final  Comment: (NOTE) The GeneXpert MRSA Assay (FDA approved for NASAL specimens only), is one component of a comprehensive MRSA colonization surveillance program. It is not intended to diagnose MRSA infection nor to guide or monitor treatment for MRSA infections. Test performance is not FDA approved in patients less than 43 years old. Performed at [facility], [address] [place], [place] [address]          Radiology Studies: ECHOCARDIOGRAM COMPLETE Result Date: 06/24/2024    ECHOCARDIOGRAM REPORT   Patient Name:   Stephen Jennings Date of Exam: 06/24/2024 Medical Rec #:  [id number]     Height:       68.0 in Accession #:    [id number]    Weight:       202.6 lb Date of Birth:  1956-01-30     BSA:          2.055 m Patient Age:    68 years      BP:           111/79 mmHg Patient Gender: M             HR:           72 bpm. Exam Location:  Inpatient Procedure: 2D Echo, Cardiac Doppler, Color Doppler and Intracardiac            Opacification Agent (Both Spectral and Color Flow Doppler were            utilized during procedure). Indications:    I50.40* Unspecified combined systolic (congestive) and diastolic                 (congestive) heart failure  History:        Patient has prior history of Echocardiogram examinations, most                 recent 01/31/2024. CHF, CAD and Previous Myocardial Infarction,                 Defibrillator and Abnormal ECG, COPD, Signs/Symptoms:Shortness                 of Breath and Dyspnea; Risk Factors:Diabetes, Dyslipidemia and                 Current Smoker.  Sonographer:    Ellouise Mose RDCS Referring Phys: [id number] CAROLE N HALL IMPRESSIONS  1.  There is hypokinesis of mid-distal anterior wall, apex, and distal anterolateral wall. Anteroseptal wall not well visualized. There is swirling coagulum present in LV apex with Definity  suggestive of early thrombus formation. Left ventricular ejection fraction, by estimation, is 30 to 35%. The left ventricle has moderately decreased function. The left ventricle demonstrates regional wall motion abnormalities (see scoring diagram/findings for description). The left ventricular internal cavity size was severely dilated. There is moderate eccentric left ventricular hypertrophy. Left ventricular diastolic parameters were normal.  2. Right ventricular systolic function is low normal. The right ventricular size is moderately enlarged. There is normal pulmonary artery systolic pressure. The estimated right ventricular systolic pressure is 23.6 mmHg.  3. Lead in RA.  4. The mitral valve is normal in structure. Mild mitral valve regurgitation. No evidence of mitral stenosis.  5. The aortic valve is tricuspid. Aortic valve regurgitation is not visualized. No aortic stenosis is present.  6. The inferior vena cava is normal in size with greater than 50% respiratory variability, suggesting right atrial pressure of 3 mmHg. Comparison(s): Compared to prior echo, EF is slightly lower and there is  evidence of early LV thrombus formation. FINDINGS  Left Ventricle: There is hypokinesis of mid-distal anterior wall, apex, and distal anterolateral wall. Anteroseptal wall not well visualized. There is swirling coagulum present in LV apex with Definity  suggestive of early thrombus formation. Left ventricular ejection fraction, by estimation, is 30 to 35%. The left ventricle has moderately decreased function. The left ventricle demonstrates regional wall motion abnormalities. Definity  contrast agent was given IV to delineate the left ventricular endocardial borders. The left ventricular internal cavity size was severely dilated. There is  moderate eccentric left ventricular hypertrophy. Left ventricular diastolic parameters were normal.  LV Wall Scoring: The mid and distal anterior wall, apical lateral segment, and apex are akinetic. The inferior septum, entire inferior wall, posterior wall, and mid anterolateral segment are hypokinetic. The basal anterolateral segment and basal anterior segment are normal. Right Ventricle: The right ventricular size is moderately enlarged. No increase in right ventricular wall thickness. Right ventricular systolic function is low normal. There is normal pulmonary artery systolic pressure. The tricuspid regurgitant velocity  is 2.27 m/s, and with an assumed right atrial pressure of 3 mmHg, the estimated right ventricular systolic pressure is 23.6 mmHg. Left Atrium: Left atrial size was normal in size. Right Atrium: Lead in RA. Right atrial size was normal in size. Pericardium: There is no evidence of pericardial effusion. Mitral Valve: The mitral valve is normal in structure. Mild mitral valve regurgitation. No evidence of mitral valve stenosis. Tricuspid Valve: The tricuspid valve is normal in structure. Tricuspid valve regurgitation is trivial. No evidence of tricuspid stenosis. Aortic Valve: The aortic valve is tricuspid. Aortic valve regurgitation is not visualized. No aortic stenosis is present. Pulmonic Valve: The pulmonic valve was not well visualized. Pulmonic valve regurgitation is not visualized. No evidence of pulmonic stenosis. Aorta: The aortic root is normal in size and structure. Venous: The inferior vena cava is normal in size with greater than 50% respiratory variability, suggesting right atrial pressure of 3 mmHg. IAS/Shunts: No atrial level shunt detected by color flow Doppler. Additional Comments: A device lead is visualized.  LEFT VENTRICLE PLAX 2D LVIDd:         6.30 cm      Diastology LVIDs:         5.30 cm      LV e' medial:    7.51 cm/s LV PW:         1.10 cm      LV E/e' medial:  12.0 LV  IVS:        1.00 cm      LV e' lateral:   10.90 cm/s LVOT diam:     2.20 cm      LV E/e' lateral: 8.3 LV SV:         69 LV SV Index:   34 LVOT Area:     3.80 cm  LV Volumes (MOD) LV vol d, MOD A2C: 187.0 ml LV vol d, MOD A4C: 201.0 ml LV vol s, MOD A2C: 116.0 ml LV vol s, MOD A4C: 155.0 ml LV SV MOD A2C:     71.0 ml LV SV MOD A4C:     201.0 ml LV SV MOD BP:      62.4 ml RIGHT VENTRICLE         IVC TAPSE (M-mode): 1.7 cm  IVC diam: 1.60 cm                          PULMONARY VEINS  Diastolic Velocity: 44.70 cm/s                         S/D Velocity:       1.10                         Systolic Velocity:  47.90 cm/s LEFT ATRIUM             Index        RIGHT ATRIUM           Index LA diam:        4.50 cm 2.19 cm/m   RA Area:     19.50 cm LA Vol (A2C):   43.6 ml 21.22 ml/m  RA Volume:   57.30 ml  27.88 ml/m LA Vol (A4C):   52.8 ml 25.69 ml/m LA Biplane Vol: 46.9 ml 22.82 ml/m  AORTIC VALVE LVOT Vmax:   108.00 cm/s LVOT Vmean:  70.200 cm/s LVOT VTI:    0.182 m MITRAL VALVE               TRICUSPID VALVE MV Area (PHT): 4.68 cm    TR Peak grad:   20.6 mmHg MV Decel Time: 162 msec    TR Vmax:        227.00 cm/s MV E velocity: 90.10 cm/s MV A velocity: 40.90 cm/s  SHUNTS MV E/A ratio:  2.20        Systemic VTI:  0.18 m                            Systemic Diam: 2.20 cm Franck Azobou Tonleu Electronically signed by Joelle Cedars Tonleu Signature Date/Time: 06/24/2024/3:28:56 PM    Final         Scheduled Meds:  [START ON 06/26/2024] aspirin  EC  81 mg Oral Daily   budesonide-glycopyrrolate-formoterol   2 puff Inhalation BID   ezetimibe   10 mg Oral Daily   furosemide   80 mg Intravenous BID   insulin  aspart  0-9 Units Subcutaneous Q4H   insulin  glargine-yfgn  5 Units Subcutaneous Daily   montelukast   10 mg Oral QHS   potassium chloride   40 mEq Oral BID   ticagrelor   90 mg Oral BID   Continuous Infusions:  sodium chloride      heparin  1,600 Units/hr (06/24/24 1947)     LOS: 2 days     Time spent: 40 minutes    Renato Applebaum, MD [facility]

## 2024-06-25 NOTE — Progress Notes (Signed)
 Progress Note  Patient Name: Stephen Jennings Date of Encounter: 06/25/2024  Primary Cardiologist: Joanathan Affeldt, DO   Subjective   Patient seen examined his bedside.  Plans for catheterization today.  He is aware but also anxious and nervous.   Inpatient Medications    Scheduled Meds:  [START ON 06/26/2024] aspirin  EC  81 mg Oral Daily   budesonide-glycopyrrolate-formoterol   2 puff Inhalation BID   ezetimibe   10 mg Oral Daily   furosemide   80 mg Intravenous BID   insulin  aspart  0-9 Units Subcutaneous Q4H   insulin  glargine-yfgn  5 Units Subcutaneous Daily   montelukast   10 mg Oral QHS   ticagrelor   90 mg Oral BID   Continuous Infusions:  sodium chloride      azithromycin  500 mg (06/24/24 1059)   ceFEPime  (MAXIPIME ) IV 2 g (06/24/24 2143)   heparin  1,600 Units/hr (06/24/24 1947)   PRN Meds: acetaminophen , ipratropium-albuterol , melatonin, nitroGLYCERIN , polyethylene glycol, prochlorperazine   Vital Signs    Vitals:   06/24/24 2120 06/25/24 0005 06/25/24 0600 06/25/24 0733  BP:  108/79 129/87 119/78  Pulse: 65 64 71 67  Resp: 14 17 (!) 25 13  Temp:  98 F (36.7 C) 97.7 F (36.5 C) 98.1 F (36.7 C)  TempSrc:  Oral Oral Oral  SpO2: 91% 94% 94% 96%  Weight:   84.4 kg   Height:        Intake/Output Summary (Last 24 hours) at 06/25/2024 0754 Last data filed at 06/25/2024 0604 Gross per 24 hour  Intake 1323.33 ml  Output 4450 ml  Net -3126.67 ml   Filed Weights   06/23/24 1339 06/24/24 0411 06/25/24 0600  Weight: 93 kg 91.9 kg 84.4 kg    Telemetry     - Personally Reviewed  ECG     - Personally Reviewed  Physical Exam     General: Comfortable in bed- awake Head: Atraumatic, normal size  Eyes: PEERLA, EOMI  Neck: Supple, normal JVD Cardiac: Normal S1, S2; RRR; no murmurs, rubs, or gallops Lungs: Clear to auscultation bilaterally Abd: Soft, nontender, no hepatomegaly  Ext: warm, no edema Musculoskeletal: No deformities, BUE and BLE strength normal and  equal Skin: Warm and dry, no rashes   Neuro: Alert and oriented to person, place, time, and situation, CNII-XII grossly intact, no focal deficits  Psych: Normal mood and affect   Labs    Chemistry Recent Labs  Lab 06/23/24 0401 06/23/24 0608 06/24/24 0207 06/25/24 0617  NA 138 141 141 144  K 4.4 4.2 3.7 3.5  CL 104  --  105 99  CO2 23  --  25 27  GLUCOSE 360*  --  141* 110*  BUN 16  --  26* 29*  CREATININE 1.34*  --  1.34* 1.17  CALCIUM  8.7*  --  8.4* 9.1  GFRNONAA 58*  --  58* >60  ANIONGAP 11  --  11 18*     Hematology Recent Labs  Lab 06/23/24 0401 06/23/24 0608 06/24/24 0207 06/25/24 0220  WBC 22.3*  --  14.2* 10.1  RBC 4.78  --  4.21* 4.85  HGB 14.2 15.3 12.7* 14.5  HCT 45.3 45.0 38.5* 43.9  MCV 94.8  --  91.4 90.5  MCH 29.7  --  30.2 29.9  MCHC 31.3  --  33.0 33.0  RDW 13.9  --  14.1 14.2  PLT 415*  --  251 264    Cardiac EnzymesNo results for input(s): TROPONINI in the last 168 hours. No results  for input(s): TROPIPOC in the last 168 hours.   BNP Recent Labs  Lab 06/23/24 0401  BNP 1,604.7*     DDimer No results for input(s): DDIMER in the last 168 hours.   Radiology    ECHOCARDIOGRAM COMPLETE Result Date: 06/24/2024    ECHOCARDIOGRAM REPORT   Patient Name:   Stephen Jennings Date of Exam: 06/24/2024 Medical Rec #:  [id number]     Height:       68.0 in Accession #:    [id number]    Weight:       202.6 lb Date of Birth:  1956-07-29     BSA:          2.055 m Patient Age:    68 years      BP:           111/79 mmHg Patient Gender: M             HR:           72 bpm. Exam Location:  Inpatient Procedure: 2D Echo, Cardiac Doppler, Color Doppler and Intracardiac            Opacification Agent (Both Spectral and Color Flow Doppler were            utilized during procedure). Indications:    I50.40* Unspecified combined systolic (congestive) and diastolic                 (congestive) heart failure  History:        Patient has prior history of Echocardiogram  examinations, most                 recent 01/31/2024. CHF, CAD and Previous Myocardial Infarction,                 Defibrillator and Abnormal ECG, COPD, Signs/Symptoms:Shortness                 of Breath and Dyspnea; Risk Factors:Diabetes, Dyslipidemia and                 Current Smoker.  Sonographer:    Ellouise Mose RDCS Referring Phys: [id number] CAROLE N HALL IMPRESSIONS  1. There is hypokinesis of mid-distal anterior wall, apex, and distal anterolateral wall. Anteroseptal wall not well visualized. There is swirling coagulum present in LV apex with Definity  suggestive of early thrombus formation. Left ventricular ejection fraction, by estimation, is 30 to 35%. The left ventricle has moderately decreased function. The left ventricle demonstrates regional wall motion abnormalities (see scoring diagram/findings for description). The left ventricular internal cavity size was severely dilated. There is moderate eccentric left ventricular hypertrophy. Left ventricular diastolic parameters were normal.  2. Right ventricular systolic function is low normal. The right ventricular size is moderately enlarged. There is normal pulmonary artery systolic pressure. The estimated right ventricular systolic pressure is 23.6 mmHg.  3. Lead in RA.  4. The mitral valve is normal in structure. Mild mitral valve regurgitation. No evidence of mitral stenosis.  5. The aortic valve is tricuspid. Aortic valve regurgitation is not visualized. No aortic stenosis is present.  6. The inferior vena cava is normal in size with greater than 50% respiratory variability, suggesting right atrial pressure of 3 mmHg. Comparison(s): Compared to prior echo, EF is slightly lower and there is evidence of early LV thrombus formation. FINDINGS  Left Ventricle: There is hypokinesis of mid-distal anterior wall, apex, and distal anterolateral wall. Anteroseptal wall not well visualized. There is swirling coagulum present in LV apex  with Definity  suggestive of early  thrombus formation. Left ventricular ejection fraction, by estimation, is 30 to 35%. The left ventricle has moderately decreased function. The left ventricle demonstrates regional wall motion abnormalities. Definity  contrast agent was given IV to delineate the left ventricular endocardial borders. The left ventricular internal cavity size was severely dilated. There is moderate eccentric left ventricular hypertrophy. Left ventricular diastolic parameters were normal.  LV Wall Scoring: The mid and distal anterior wall, apical lateral segment, and apex are akinetic. The inferior septum, entire inferior wall, posterior wall, and mid anterolateral segment are hypokinetic. The basal anterolateral segment and basal anterior segment are normal. Right Ventricle: The right ventricular size is moderately enlarged. No increase in right ventricular wall thickness. Right ventricular systolic function is low normal. There is normal pulmonary artery systolic pressure. The tricuspid regurgitant velocity  is 2.27 m/s, and with an assumed right atrial pressure of 3 mmHg, the estimated right ventricular systolic pressure is 23.6 mmHg. Left Atrium: Left atrial size was normal in size. Right Atrium: Lead in RA. Right atrial size was normal in size. Pericardium: There is no evidence of pericardial effusion. Mitral Valve: The mitral valve is normal in structure. Mild mitral valve regurgitation. No evidence of mitral valve stenosis. Tricuspid Valve: The tricuspid valve is normal in structure. Tricuspid valve regurgitation is trivial. No evidence of tricuspid stenosis. Aortic Valve: The aortic valve is tricuspid. Aortic valve regurgitation is not visualized. No aortic stenosis is present. Pulmonic Valve: The pulmonic valve was not well visualized. Pulmonic valve regurgitation is not visualized. No evidence of pulmonic stenosis. Aorta: The aortic root is normal in size and structure. Venous: The inferior vena cava is normal in size with  greater than 50% respiratory variability, suggesting right atrial pressure of 3 mmHg. IAS/Shunts: No atrial level shunt detected by color flow Doppler. Additional Comments: A device lead is visualized.  LEFT VENTRICLE PLAX 2D LVIDd:         6.30 cm      Diastology LVIDs:         5.30 cm      LV e' medial:    7.51 cm/s LV PW:         1.10 cm      LV E/e' medial:  12.0 LV IVS:        1.00 cm      LV e' lateral:   10.90 cm/s LVOT diam:     2.20 cm      LV E/e' lateral: 8.3 LV SV:         69 LV SV Index:   34 LVOT Area:     3.80 cm  LV Volumes (MOD) LV vol d, MOD A2C: 187.0 ml LV vol d, MOD A4C: 201.0 ml LV vol s, MOD A2C: 116.0 ml LV vol s, MOD A4C: 155.0 ml LV SV MOD A2C:     71.0 ml LV SV MOD A4C:     201.0 ml LV SV MOD BP:      62.4 ml RIGHT VENTRICLE         IVC TAPSE (M-mode): 1.7 cm  IVC diam: 1.60 cm                          PULMONARY VEINS                         Diastolic Velocity: 44.70 cm/s  S/D Velocity:       1.10                         Systolic Velocity:  47.90 cm/s LEFT ATRIUM             Index        RIGHT ATRIUM           Index LA diam:        4.50 cm 2.19 cm/m   RA Area:     19.50 cm LA Vol (A2C):   43.6 ml 21.22 ml/m  RA Volume:   57.30 ml  27.88 ml/m LA Vol (A4C):   52.8 ml 25.69 ml/m LA Biplane Vol: 46.9 ml 22.82 ml/m  AORTIC VALVE LVOT Vmax:   108.00 cm/s LVOT Vmean:  70.200 cm/s LVOT VTI:    0.182 m MITRAL VALVE               TRICUSPID VALVE MV Area (PHT): 4.68 cm    TR Peak grad:   20.6 mmHg MV Decel Time: 162 msec    TR Vmax:        227.00 cm/s MV E velocity: 90.10 cm/s MV A velocity: 40.90 cm/s  SHUNTS MV E/A ratio:  2.20        Systemic VTI:  0.18 m                            Systemic Diam: 2.20 cm Franck Azobou Tonleu Electronically signed by Joelle Cedars Tonleu Signature Date/Time: 06/24/2024/3:28:56 PM    Final     Cardiac Studies     Patient Profile     68 y.o. male   Assessment & Plan    Acute on chronic heart failure with midrange ejection  fraction CAD s/p PCI Ischemic cardiomyopathy EF 40 to 45%  Type II NSTEMI Hx of Torsades s/p ICD Community-acquired pneumonia  Plan for right and left heart cath today, he is very hesitant but still wants to go through with a heart catheterization.  His only hope is to go home so his wife.  The patient understands that risks include but are not limited to stroke (1 in 1000), death (1 in 1000), kidney failure [usually temporary] (1 in 500), bleeding (1 in 200), allergic reaction [possibly serious] (1 in 200), and agrees to proceed.  For now continue heparin  drip, continue dual antiplatelet therapy, continue statin  In terms of his heart failure he has responded well to the Lasix  we will continue Lasix  80 mg IV twice a day.  Total output is-3170milliliters.  His blood pressure is marginal so very hard to restart his guideline directed medical therapy.  Jardiance  has been held in the plans for upcoming heart catheterization.  Will monitor electrolytes.  Known chronic kidney disease, will monitor creatinine on diuretics and post cath.   For questions or updates, please contact [facility] Please consult [email or web address] for contact info under Cardiology/STEMI.      Signed, Lijah Bourque, DO  06/25/2024, 7:54 AM

## 2024-06-25 NOTE — Progress Notes (Signed)
 [name]  06/25/24 0950  Mobility  Activity Ambulated with assistance;Pivoted/transferred from bed to chair  Level of Assistance Contact guard assist, steadying assist  Assistive Device Other (Comment) (IV pole)  Distance Ambulated (ft) 125 ft  Activity Response Tolerated well  Mobility Referral Yes  Mobility visit 1 Mobility  Mobility Specialist Start Time (ACUTE ONLY) 0950  Mobility Specialist Stop Time (ACUTE ONLY) 1000  Mobility Specialist Time Calculation (min) (ACUTE ONLY) 10 min   Pt agreeable to mobility. On 1LO2 upon arrival. Required light MinG assistance during ambulation for safety. Ambulated on 1LO2, SPO2 93-95% throughout when able to receive accurate pleth. Agreeable to sit up in chair at [facility]. Pt left in chair with all needs met, call bell in reach. Family present.   Lauraine Erm Mobility Specialist Please contact via SecureChat or [employer] [phone]

## 2024-06-25 NOTE — Progress Notes (Signed)
 Heart Failure Navigator Progress Note  Assessed for Heart & Vascular TOC clinic readiness.  Patient does not meet criteria due to he is a Advanced Heart Failure Team patient of Dr. Bensimhon. .   Navigator will sign off at this time.   Stephane Haddock, BSN, Scientist, Clinical (histocompatibility And Immunogenetics) Only

## 2024-06-25 NOTE — Progress Notes (Signed)
 PHARMACY - ANTICOAGULATION CONSULT NOTE  Pharmacy Consult for Heparin  Indication: chest pain/ACS  Allergies  Allergen Reactions   Aldactone  [Spironolactone ] Other (See Comments)    Gynecomastia    Lipitor  [Atorvastatin ] Other (See Comments)    Myalgias Fatigue   Glucophage  [Metformin ] Anxiety and Palpitations    Patient Measurements: Height: 5' 8 (172.7 cm) Weight: 84.4 kg (186 lb) IBW/kg (Calculated) : 68.4 HEPARIN  DW (KG): 87.8  Vital Signs: Temp: 98.1 F (36.7 C) (11/04 0733) Temp Source: Oral (11/04 0733) BP: 119/78 (11/04 0733) Pulse Rate: 67 (11/04 0733)  Labs: Recent Labs    06/23/24 0401 06/23/24 0548 06/23/24 9391 06/23/24 0621 06/23/24 1848 06/24/24 0207 06/24/24 1032 06/25/24 0220 06/25/24 0617  HGB 14.2  --  15.3  --   --  12.7*  --  14.5  --   HCT 45.3  --  45.0  --   --  38.5*  --  43.9  --   PLT 415*  --   --   --   --  251  --  264  --   HEPARINUNFRC  --   --   --   --    < > 0.20* 0.43 0.46  --   CREATININE 1.34*  --   --   --   --  1.34*  --   --  1.17  TROPONINIHS 138* 1,241*  --  5,061*  --   --   --   --   --    < > = values in this interval not displayed.    Estimated Creatinine Clearance: 63.9 mL/min (by C-G formula based on SCr of 1.17 mg/dL).   Medical History: Past Medical History:  Diagnosis Date   Cardiogenic shock (HCC)    CHF (congestive heart failure) (HCC)    Coronary artery disease    Diabetes mellitus without complication (HCC)    Hyperlipidemia    Medical history non-contributory     Medications:  No current facility-administered medications on file prior to encounter.   Current Outpatient Medications on File Prior to Encounter  Medication Sig Dispense Refill   albuterol  (PROVENTIL ) (2.5 MG/3ML) 0.083% nebulizer solution Take 3 mLs (2.5 mg total) by nebulization every 6 (six) hours as needed for wheezing or shortness of breath. 75 mL 12   albuterol  (VENTOLIN  HFA) 108 (90 Base) MCG/ACT inhaler INHALE 2 PUFFS BY  MOUTH EVERY 6 HOURS AS NEEDED FOR WHEEZING FOR SHORTNESS OF BREATH 18 g 0   bisoprolol  (ZEBETA ) 5 MG tablet Take 1 tablet (5 mg total) by mouth daily. NEEDS FOLLOW UP APPOINTMENT FOR MORE REFILLS 30 tablet 0   budesonide-glycopyrrolate-formoterol  (BREZTRI  AEROSPHERE) 160-9-4.8 MCG/ACT AERO inhaler Inhale 2 puffs into the lungs 2 (two) times daily. 3 each 4   eplerenone  (INSPRA ) 25 MG tablet Take 0.5 tablets (12.5 mg total) by mouth daily. PLEASE SCHEDULE APPOINTMENT FOR MORE REFILLS 45 tablet 0   Evolocumab  (REPATHA  SURECLICK) 140 MG/ML SOAJ Inject 140 mg into the skin every 14 (fourteen) days. 6 mL 3   fluticasone  (FLONASE ) 50 MCG/ACT nasal spray USE 1 SPRAY IN EACH NOSTRIL ONCE DAILY AS NEEDED FOR ALLERGIES OR RHINITIS 48 g 0   furosemide  (LASIX ) 20 MG tablet Take 2 tablets (40 mg total) by mouth daily. (Patient taking differently: Take 20 mg by mouth 2 (two) times daily.) 180 tablet 3   JARDIANCE  10 MG TABS tablet Take 1 tablet (10 mg total) by mouth daily. Must have office visit for refills 30 tablet 0  loratadine  (CLARITIN ) 10 MG tablet Take 1 tablet by mouth once daily 90 tablet 3   montelukast  (SINGULAIR ) 10 MG tablet TAKE ONE TABLET BY MOUTH DAILY AT BEDTIME 30 tablet 0   nitroGLYCERIN  (NITROSTAT ) 0.4 MG SL tablet Place 1 tablet (0.4 mg total) under the tongue every 5 (five) minutes x 3 doses as needed for chest pain. 25 tablet 3   omeprazole  (PRILOSEC) 20 MG capsule Take 1 capsule (20 mg total) by mouth daily. 90 capsule 1   potassium chloride  SA (KLOR-CON  M20) 20 MEQ tablet On the first day take 60meq (3 tablets) wait 4 hours then take another 60meq (3 tablets) by mouth. THEN THURSDAY 8/29 start 2 tablets by mouth 2 times daily. (Patient taking differently: Take 40 mEq by mouth 2 (two) times daily. On the first day take 60meq (3 tablets) wait 4 hours then take another 60meq (3 tablets) by mouth. THEN THURSDAY 8/29 start 2 tablets by mouth 2 times daily.) 90 tablet 3   sacubitril -valsartan   (ENTRESTO ) 97-103 MG Take 1 tablet by mouth 2 (two) times daily. 180 tablet 2   ticagrelor  (BRILINTA ) 90 MG TABS tablet Take 1 tablet (90 mg total) by mouth 2 (two) times daily. 60 tablet 11   traZODone  (DESYREL ) 50 MG tablet TAKE HALF TABLET BY MOUTH AT BEDTIME 30 tablet 1   ezetimibe  (ZETIA ) 10 MG tablet Take 1 tablet (10 mg total) by mouth daily. (Patient not taking: Reported on 06/24/2024) 90 tablet 3   pravastatin  (PRAVACHOL ) 80 MG tablet Take 1 tablet (80 mg total) by mouth every evening. (Patient not taking: Reported on 06/24/2024) 90 tablet 3     Assessment: 68 y.o. male with CHF and elevated troponin, possible ACS, for heparin . No AC PTA.  Heparin  level continues to be therapeutic. Cath today.   CBC stable  Goal of Therapy:  Heparin  level 0.3-0.7 units/ml Monitor platelets by anticoagulation protocol: Yes   Plan:  Cont heparin  infusion at 1600 units/hr  F/u after cath Monitor daily HL, CBC, and for s/sx of bleeding   Sergio Batch, PharmD, BCIDP, AAHIVP, CPP Infectious Disease Pharmacist 06/25/2024 8:36 AM

## 2024-06-25 NOTE — H&P (View-Only) (Signed)
 Progress Note  Patient Name: Stephen Jennings Date of Encounter: 06/25/2024  Primary Cardiologist: Joanathan Affeldt, DO   Subjective   Patient seen examined his bedside.  Plans for catheterization today.  He is aware but also anxious and nervous.   Inpatient Medications    Scheduled Meds:  [START ON 06/26/2024] aspirin  EC  81 mg Oral Daily   budesonide-glycopyrrolate-formoterol   2 puff Inhalation BID   ezetimibe   10 mg Oral Daily   furosemide   80 mg Intravenous BID   insulin  aspart  0-9 Units Subcutaneous Q4H   insulin  glargine-yfgn  5 Units Subcutaneous Daily   montelukast   10 mg Oral QHS   ticagrelor   90 mg Oral BID   Continuous Infusions:  sodium chloride      azithromycin  500 mg (06/24/24 1059)   ceFEPime  (MAXIPIME ) IV 2 g (06/24/24 2143)   heparin  1,600 Units/hr (06/24/24 1947)   PRN Meds: acetaminophen , ipratropium-albuterol , melatonin, nitroGLYCERIN , polyethylene glycol, prochlorperazine   Vital Signs    Vitals:   06/24/24 2120 06/25/24 0005 06/25/24 0600 06/25/24 0733  BP:  108/79 129/87 119/78  Pulse: 65 64 71 67  Resp: 14 17 (!) 25 13  Temp:  98 F (36.7 C) 97.7 F (36.5 C) 98.1 F (36.7 C)  TempSrc:  Oral Oral Oral  SpO2: 91% 94% 94% 96%  Weight:   84.4 kg   Height:        Intake/Output Summary (Last 24 hours) at 06/25/2024 0754 Last data filed at 06/25/2024 0604 Gross per 24 hour  Intake 1323.33 ml  Output 4450 ml  Net -3126.67 ml   Filed Weights   06/23/24 1339 06/24/24 0411 06/25/24 0600  Weight: 93 kg 91.9 kg 84.4 kg    Telemetry     - Personally Reviewed  ECG     - Personally Reviewed  Physical Exam     General: Comfortable in bed- awake Head: Atraumatic, normal size  Eyes: PEERLA, EOMI  Neck: Supple, normal JVD Cardiac: Normal S1, S2; RRR; no murmurs, rubs, or gallops Lungs: Clear to auscultation bilaterally Abd: Soft, nontender, no hepatomegaly  Ext: warm, no edema Musculoskeletal: No deformities, BUE and BLE strength normal and  equal Skin: Warm and dry, no rashes   Neuro: Alert and oriented to person, place, time, and situation, CNII-XII grossly intact, no focal deficits  Psych: Normal mood and affect   Labs    Chemistry Recent Labs  Lab 06/23/24 0401 06/23/24 0608 06/24/24 0207 06/25/24 0617  NA 138 141 141 144  K 4.4 4.2 3.7 3.5  CL 104  --  105 99  CO2 23  --  25 27  GLUCOSE 360*  --  141* 110*  BUN 16  --  26* 29*  CREATININE 1.34*  --  1.34* 1.17  CALCIUM  8.7*  --  8.4* 9.1  GFRNONAA 58*  --  58* >60  ANIONGAP 11  --  11 18*     Hematology Recent Labs  Lab 06/23/24 0401 06/23/24 0608 06/24/24 0207 06/25/24 0220  WBC 22.3*  --  14.2* 10.1  RBC 4.78  --  4.21* 4.85  HGB 14.2 15.3 12.7* 14.5  HCT 45.3 45.0 38.5* 43.9  MCV 94.8  --  91.4 90.5  MCH 29.7  --  30.2 29.9  MCHC 31.3  --  33.0 33.0  RDW 13.9  --  14.1 14.2  PLT 415*  --  251 264    Cardiac EnzymesNo results for input(s): TROPONINI in the last 168 hours. No results  for input(s): TROPIPOC in the last 168 hours.   BNP Recent Labs  Lab 06/23/24 0401  BNP 1,604.7*     DDimer No results for input(s): DDIMER in the last 168 hours.   Radiology    ECHOCARDIOGRAM COMPLETE Result Date: 06/24/2024    ECHOCARDIOGRAM REPORT   Patient Name:   Stephen Jennings Date of Exam: 06/24/2024 Medical Rec #:  [id number]     Height:       68.0 in Accession #:    [id number]    Weight:       202.6 lb Date of Birth:  1956-07-29     BSA:          2.055 m Patient Age:    68 years      BP:           111/79 mmHg Patient Gender: M             HR:           72 bpm. Exam Location:  Inpatient Procedure: 2D Echo, Cardiac Doppler, Color Doppler and Intracardiac            Opacification Agent (Both Spectral and Color Flow Doppler were            utilized during procedure). Indications:    I50.40* Unspecified combined systolic (congestive) and diastolic                 (congestive) heart failure  History:        Patient has prior history of Echocardiogram  examinations, most                 recent 01/31/2024. CHF, CAD and Previous Myocardial Infarction,                 Defibrillator and Abnormal ECG, COPD, Signs/Symptoms:Shortness                 of Breath and Dyspnea; Risk Factors:Diabetes, Dyslipidemia and                 Current Smoker.  Sonographer:    Ellouise Mose RDCS Referring Phys: [id number] CAROLE N HALL IMPRESSIONS  1. There is hypokinesis of mid-distal anterior wall, apex, and distal anterolateral wall. Anteroseptal wall not well visualized. There is swirling coagulum present in LV apex with Definity  suggestive of early thrombus formation. Left ventricular ejection fraction, by estimation, is 30 to 35%. The left ventricle has moderately decreased function. The left ventricle demonstrates regional wall motion abnormalities (see scoring diagram/findings for description). The left ventricular internal cavity size was severely dilated. There is moderate eccentric left ventricular hypertrophy. Left ventricular diastolic parameters were normal.  2. Right ventricular systolic function is low normal. The right ventricular size is moderately enlarged. There is normal pulmonary artery systolic pressure. The estimated right ventricular systolic pressure is 23.6 mmHg.  3. Lead in RA.  4. The mitral valve is normal in structure. Mild mitral valve regurgitation. No evidence of mitral stenosis.  5. The aortic valve is tricuspid. Aortic valve regurgitation is not visualized. No aortic stenosis is present.  6. The inferior vena cava is normal in size with greater than 50% respiratory variability, suggesting right atrial pressure of 3 mmHg. Comparison(s): Compared to prior echo, EF is slightly lower and there is evidence of early LV thrombus formation. FINDINGS  Left Ventricle: There is hypokinesis of mid-distal anterior wall, apex, and distal anterolateral wall. Anteroseptal wall not well visualized. There is swirling coagulum present in LV apex  with Definity  suggestive of early  thrombus formation. Left ventricular ejection fraction, by estimation, is 30 to 35%. The left ventricle has moderately decreased function. The left ventricle demonstrates regional wall motion abnormalities. Definity  contrast agent was given IV to delineate the left ventricular endocardial borders. The left ventricular internal cavity size was severely dilated. There is moderate eccentric left ventricular hypertrophy. Left ventricular diastolic parameters were normal.  LV Wall Scoring: The mid and distal anterior wall, apical lateral segment, and apex are akinetic. The inferior septum, entire inferior wall, posterior wall, and mid anterolateral segment are hypokinetic. The basal anterolateral segment and basal anterior segment are normal. Right Ventricle: The right ventricular size is moderately enlarged. No increase in right ventricular wall thickness. Right ventricular systolic function is low normal. There is normal pulmonary artery systolic pressure. The tricuspid regurgitant velocity  is 2.27 m/s, and with an assumed right atrial pressure of 3 mmHg, the estimated right ventricular systolic pressure is 23.6 mmHg. Left Atrium: Left atrial size was normal in size. Right Atrium: Lead in RA. Right atrial size was normal in size. Pericardium: There is no evidence of pericardial effusion. Mitral Valve: The mitral valve is normal in structure. Mild mitral valve regurgitation. No evidence of mitral valve stenosis. Tricuspid Valve: The tricuspid valve is normal in structure. Tricuspid valve regurgitation is trivial. No evidence of tricuspid stenosis. Aortic Valve: The aortic valve is tricuspid. Aortic valve regurgitation is not visualized. No aortic stenosis is present. Pulmonic Valve: The pulmonic valve was not well visualized. Pulmonic valve regurgitation is not visualized. No evidence of pulmonic stenosis. Aorta: The aortic root is normal in size and structure. Venous: The inferior vena cava is normal in size with  greater than 50% respiratory variability, suggesting right atrial pressure of 3 mmHg. IAS/Shunts: No atrial level shunt detected by color flow Doppler. Additional Comments: A device lead is visualized.  LEFT VENTRICLE PLAX 2D LVIDd:         6.30 cm      Diastology LVIDs:         5.30 cm      LV e' medial:    7.51 cm/s LV PW:         1.10 cm      LV E/e' medial:  12.0 LV IVS:        1.00 cm      LV e' lateral:   10.90 cm/s LVOT diam:     2.20 cm      LV E/e' lateral: 8.3 LV SV:         69 LV SV Index:   34 LVOT Area:     3.80 cm  LV Volumes (MOD) LV vol d, MOD A2C: 187.0 ml LV vol d, MOD A4C: 201.0 ml LV vol s, MOD A2C: 116.0 ml LV vol s, MOD A4C: 155.0 ml LV SV MOD A2C:     71.0 ml LV SV MOD A4C:     201.0 ml LV SV MOD BP:      62.4 ml RIGHT VENTRICLE         IVC TAPSE (M-mode): 1.7 cm  IVC diam: 1.60 cm                          PULMONARY VEINS                         Diastolic Velocity: 44.70 cm/s  S/D Velocity:       1.10                         Systolic Velocity:  47.90 cm/s LEFT ATRIUM             Index        RIGHT ATRIUM           Index LA diam:        4.50 cm 2.19 cm/m   RA Area:     19.50 cm LA Vol (A2C):   43.6 ml 21.22 ml/m  RA Volume:   57.30 ml  27.88 ml/m LA Vol (A4C):   52.8 ml 25.69 ml/m LA Biplane Vol: 46.9 ml 22.82 ml/m  AORTIC VALVE LVOT Vmax:   108.00 cm/s LVOT Vmean:  70.200 cm/s LVOT VTI:    0.182 m MITRAL VALVE               TRICUSPID VALVE MV Area (PHT): 4.68 cm    TR Peak grad:   20.6 mmHg MV Decel Time: 162 msec    TR Vmax:        227.00 cm/s MV E velocity: 90.10 cm/s MV A velocity: 40.90 cm/s  SHUNTS MV E/A ratio:  2.20        Systemic VTI:  0.18 m                            Systemic Diam: 2.20 cm Franck Azobou Tonleu Electronically signed by Joelle Cedars Tonleu Signature Date/Time: 06/24/2024/3:28:56 PM    Final     Cardiac Studies     Patient Profile     68 y.o. male   Assessment & Plan    Acute on chronic heart failure with midrange ejection  fraction CAD s/p PCI Ischemic cardiomyopathy EF 40 to 45%  Type II NSTEMI Hx of Torsades s/p ICD Community-acquired pneumonia  Plan for right and left heart cath today, he is very hesitant but still wants to go through with a heart catheterization.  His only hope is to go home so his wife.  The patient understands that risks include but are not limited to stroke (1 in 1000), death (1 in 1000), kidney failure [usually temporary] (1 in 500), bleeding (1 in 200), allergic reaction [possibly serious] (1 in 200), and agrees to proceed.  For now continue heparin  drip, continue dual antiplatelet therapy, continue statin  In terms of his heart failure he has responded well to the Lasix  we will continue Lasix  80 mg IV twice a day.  Total output is-3170milliliters.  His blood pressure is marginal so very hard to restart his guideline directed medical therapy.  Jardiance  has been held in the plans for upcoming heart catheterization.  Will monitor electrolytes.  Known chronic kidney disease, will monitor creatinine on diuretics and post cath.   For questions or updates, please contact [facility] Please consult [email or web address] for contact info under Cardiology/STEMI.      Signed, Lijah Bourque, DO  06/25/2024, 7:54 AM

## 2024-06-26 ENCOUNTER — Other Ambulatory Visit: Payer: Self-pay | Admitting: Physician Assistant

## 2024-06-26 ENCOUNTER — Inpatient Hospital Stay: Admitting: Cardiology

## 2024-06-26 DIAGNOSIS — I513 Intracardiac thrombosis, not elsewhere classified: Secondary | ICD-10-CM

## 2024-06-26 DIAGNOSIS — I5023 Acute on chronic systolic (congestive) heart failure: Secondary | ICD-10-CM | POA: Diagnosis not present

## 2024-06-26 DIAGNOSIS — I2510 Atherosclerotic heart disease of native coronary artery without angina pectoris: Secondary | ICD-10-CM | POA: Diagnosis not present

## 2024-06-26 DIAGNOSIS — I509 Heart failure, unspecified: Secondary | ICD-10-CM | POA: Diagnosis not present

## 2024-06-26 LAB — CBC
HCT: 46.8 % (ref 39.0–52.0)
Hemoglobin: 15.4 g/dL (ref 13.0–17.0)
MCH: 29.7 pg (ref 26.0–34.0)
MCHC: 32.9 g/dL (ref 30.0–36.0)
MCV: 90.2 fL (ref 80.0–100.0)
Platelets: 283 K/uL (ref 150–400)
RBC: 5.19 MIL/uL (ref 4.22–5.81)
RDW: 13.9 % (ref 11.5–15.5)
WBC: 8.3 K/uL (ref 4.0–10.5)
nRBC: 0 % (ref 0.0–0.2)

## 2024-06-26 LAB — BASIC METABOLIC PANEL WITH GFR
Anion gap: 15 (ref 5–15)
BUN: 31 mg/dL — ABNORMAL HIGH (ref 8–23)
CO2: 27 mmol/L (ref 22–32)
Calcium: 9.3 mg/dL (ref 8.9–10.3)
Chloride: 96 mmol/L — ABNORMAL LOW (ref 98–111)
Creatinine, Ser: 1.57 mg/dL — ABNORMAL HIGH (ref 0.61–1.24)
GFR, Estimated: 48 mL/min — ABNORMAL LOW (ref >60)
Glucose, Bld: 201 mg/dL — ABNORMAL HIGH (ref 70–99)
Potassium: 3.5 mmol/L (ref 3.5–5.1)
Sodium: 138 mmol/L (ref 135–145)

## 2024-06-26 LAB — LEGIONELLA PNEUMOPHILA SEROGP 1 UR AG
L. pneumophila Serogp 1 Ur Ag: NEGATIVE
Source of Sample: 41499

## 2024-06-26 LAB — GLUCOSE, CAPILLARY
Glucose-Capillary: 128 mg/dL — ABNORMAL HIGH (ref 70–99)
Glucose-Capillary: 160 mg/dL — ABNORMAL HIGH (ref 70–99)

## 2024-06-26 MED ORDER — METOPROLOL SUCCINATE ER 25 MG PO TB24
12.5000 mg | ORAL_TABLET | Freq: Every day | ORAL | Status: DC
  Administered 2024-06-26: 12.5 mg via ORAL
  Filled 2024-06-26: qty 1

## 2024-06-26 MED ORDER — METOPROLOL SUCCINATE ER 25 MG PO TB24: 12.5000 mg | ORAL_TABLET | Freq: Every day | ORAL | 1 refills | Status: AC

## 2024-06-26 MED ORDER — ASPIRIN 81 MG PO TBEC: 81.0000 mg | DELAYED_RELEASE_TABLET | Freq: Every day | ORAL | 12 refills | Status: AC

## 2024-06-26 MED FILL — Verapamil HCl IV Soln 2.5 MG/ML: INTRAVENOUS | Qty: 2 | Status: AC

## 2024-06-26 NOTE — Progress Notes (Signed)
 Progress Note  Patient Name: Stephen Jennings Date of Encounter: 06/26/2024  Primary Cardiologist: Kiah Keay, DO   Subjective   Patient seen examined his bedside.   Inpatient Medications    Scheduled Meds:  aspirin  EC  81 mg Oral Daily   budesonide-glycopyrrolate-formoterol   2 puff Inhalation BID   enoxaparin (LOVENOX) injection  40 mg Subcutaneous Daily   ezetimibe   10 mg Oral Daily   furosemide   80 mg Intravenous BID   insulin  aspart  0-9 Units Subcutaneous TID AC & HS   insulin  glargine-yfgn  5 Units Subcutaneous Daily   montelukast   10 mg Oral QHS   potassium chloride   40 mEq Oral BID   sodium chloride  flush  3 mL Intravenous Q12H   ticagrelor   90 mg Oral BID   Continuous Infusions:  sodium chloride      PRN Meds: sodium chloride , acetaminophen , ipratropium-albuterol , melatonin, nitroGLYCERIN , polyethylene glycol, prochlorperazine, sodium chloride  flush   Vital Signs    Vitals:   06/25/24 2301 06/26/24 0407 06/26/24 0814 06/26/24 0831  BP: 98/60 (!) 120/90 110/64   Pulse: 75 71 73   Resp: 19 18 16    Temp: 98.4 F (36.9 C) 97.9 F (36.6 C) 97.9 F (36.6 C)   TempSrc: Oral Oral Oral   SpO2: 91% 95%  94%  Weight:  84.2 kg    Height:        Intake/Output Summary (Last 24 hours) at 06/26/2024 1036 Last data filed at 06/26/2024 0640 Gross per 24 hour  Intake 1000 ml  Output 1900 ml  Net -900 ml   Filed Weights   06/24/24 0411 06/25/24 0600 06/26/24 0407  Weight: 91.9 kg 84.4 kg 84.2 kg    Telemetry     - Personally Reviewed  ECG     - Personally Reviewed  Physical Exam     General: Comfortable in bed- awake Head: Atraumatic, normal size  Eyes: PEERLA, EOMI  Neck: Supple, normal JVD Cardiac: Normal S1, S2; RRR; no murmurs, rubs, or gallops Lungs: Clear to auscultation bilaterally Abd: Soft, nontender, no hepatomegaly  Ext: warm, no edema Musculoskeletal: No deformities, BUE and BLE strength normal and equal Skin: Warm and dry, no rashes    Neuro: Alert and oriented to person, place, time, and situation, CNII-XII grossly intact, no focal deficits  Psych: Normal mood and affect   Labs    Chemistry Recent Labs  Lab 06/24/24 0207 06/25/24 0617 06/25/24 1609 06/25/24 1613 06/25/24 1615 06/26/24 0748  NA 141 144   < > 142 142 138  K 3.7 3.5   < > 3.5 3.6 3.5  CL 105 99  --   --   --  96*  CO2 25 27  --   --   --  27  GLUCOSE 141* 110*  --   --   --  201*  BUN 26* 29*  --   --   --  31*  CREATININE 1.34* 1.17  --   --   --  1.57*  CALCIUM  8.4* 9.1  --   --   --  9.3  GFRNONAA 58* >60  --   --   --  48*  ANIONGAP 11 18*  --   --   --  15   < > = values in this interval not displayed.     Hematology Recent Labs  Lab 06/24/24 0207 06/25/24 0220 06/25/24 1609 06/25/24 1613 06/25/24 1615 06/26/24 0228  WBC 14.2* 10.1  --   --   --  8.3  RBC 4.21* 4.85  --   --   --  5.19  HGB 12.7* 14.5   < > 16.0 16.0 15.4  HCT 38.5* 43.9   < > 47.0 47.0 46.8  MCV 91.4 90.5  --   --   --  90.2  MCH 30.2 29.9  --   --   --  29.7  MCHC 33.0 33.0  --   --   --  32.9  RDW 14.1 14.2  --   --   --  13.9  PLT 251 264  --   --   --  283   < > = values in this interval not displayed.    Cardiac EnzymesNo results for input(s): TROPONINI in the last 168 hours. No results for input(s): TROPIPOC in the last 168 hours.   BNP Recent Labs  Lab 06/23/24 0401  BNP 1,604.7*     DDimer No results for input(s): DDIMER in the last 168 hours.   Radiology    CARDIAC CATHETERIZATION Result Date: 06/25/2024 Images from the original result were not included. 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        Mid 40% disease RCA: Prox 20% disease LVEDP 10 mmHg Right heart catheterization 06/25/2024: RA: 4 mmHg RV: 28/0 mmHg PA: 23/10 mmHg, mPAP 16 mmHg PCW: 6 mmHg AO sats: 95% PA sats:  67% CO: 4.9 L/min CI: 2.4 L/min/m2 Conclusion: Moderate nonobstructive coronary artery disease Compensated nonischemic cardiomyopathy Recommendation: GDMT for HFrEF Newman JINNY Lawrence, MD   ECHOCARDIOGRAM COMPLETE Result Date: 06/24/2024    ECHOCARDIOGRAM REPORT   Patient Name:   Stephen Jennings Date of Exam: 06/24/2024 Medical Rec #:  [id number]     Height:       68.0 in Accession #:    [id number]    Weight:       202.6 lb Date of Birth:  08-08-1956     BSA:          2.055 m Patient Age:    68 years      BP:           111/79 mmHg Patient Gender: M             HR:           72 bpm. Exam Location:  Inpatient Procedure: 2D Echo, Cardiac Doppler, Color Doppler and Intracardiac            Opacification Agent (Both Spectral and Color Flow Doppler were            utilized during procedure). Indications:    I50.40* Unspecified combined systolic (congestive) and diastolic                 (congestive) heart failure  History:        Patient has prior history of Echocardiogram examinations, most                 recent 01/31/2024. CHF, CAD and Previous Myocardial Infarction,                 Defibrillator and Abnormal ECG, COPD, Signs/Symptoms:Shortness                 of Breath and Dyspnea; Risk Factors:Diabetes, Dyslipidemia and  Current Smoker.  Sonographer:    Ellouise Mose RDCS Referring Phys: [id number] CAROLE N HALL IMPRESSIONS  1. There is hypokinesis of mid-distal anterior wall, apex, and distal anterolateral wall. Anteroseptal wall not well visualized. There is swirling coagulum present in LV apex with Definity  suggestive of early thrombus formation. Left ventricular ejection fraction, by estimation, is 30 to 35%. The left ventricle has moderately decreased function. The left ventricle demonstrates regional wall motion abnormalities (see scoring diagram/findings for description). The left ventricular internal cavity size was severely dilated. There is moderate eccentric left ventricular hypertrophy. Left  ventricular diastolic parameters were normal.  2. Right ventricular systolic function is low normal. The right ventricular size is moderately enlarged. There is normal pulmonary artery systolic pressure. The estimated right ventricular systolic pressure is 23.6 mmHg.  3. Lead in RA.  4. The mitral valve is normal in structure. Mild mitral valve regurgitation. No evidence of mitral stenosis.  5. The aortic valve is tricuspid. Aortic valve regurgitation is not visualized. No aortic stenosis is present.  6. The inferior vena cava is normal in size with greater than 50% respiratory variability, suggesting right atrial pressure of 3 mmHg. Comparison(s): Compared to prior echo, EF is slightly lower and there is evidence of early LV thrombus formation. FINDINGS  Left Ventricle: There is hypokinesis of mid-distal anterior wall, apex, and distal anterolateral wall. Anteroseptal wall not well visualized. There is swirling coagulum present in LV apex with Definity  suggestive of early thrombus formation. Left ventricular ejection fraction, by estimation, is 30 to 35%. The left ventricle has moderately decreased function. The left ventricle demonstrates regional wall motion abnormalities. Definity  contrast agent was given IV to delineate the left ventricular endocardial borders. The left ventricular internal cavity size was severely dilated. There is moderate eccentric left ventricular hypertrophy. Left ventricular diastolic parameters were normal.  LV Wall Scoring: The mid and distal anterior wall, apical lateral segment, and apex are akinetic. The inferior septum, entire inferior wall, posterior wall, and mid anterolateral segment are hypokinetic. The basal anterolateral segment and basal anterior segment are normal. Right Ventricle: The right ventricular size is moderately enlarged. No increase in right ventricular wall thickness. Right ventricular systolic function is low normal. There is normal pulmonary artery systolic  pressure. The tricuspid regurgitant velocity  is 2.27 m/s, and with an assumed right atrial pressure of 3 mmHg, the estimated right ventricular systolic pressure is 23.6 mmHg. Left Atrium: Left atrial size was normal in size. Right Atrium: Lead in RA. Right atrial size was normal in size. Pericardium: There is no evidence of pericardial effusion. Mitral Valve: The mitral valve is normal in structure. Mild mitral valve regurgitation. No evidence of mitral valve stenosis. Tricuspid Valve: The tricuspid valve is normal in structure. Tricuspid valve regurgitation is trivial. No evidence of tricuspid stenosis. Aortic Valve: The aortic valve is tricuspid. Aortic valve regurgitation is not visualized. No aortic stenosis is present. Pulmonic Valve: The pulmonic valve was not well visualized. Pulmonic valve regurgitation is not visualized. No evidence of pulmonic stenosis. Aorta: The aortic root is normal in size and structure. Venous: The inferior vena cava is normal in size with greater than 50% respiratory variability, suggesting right atrial pressure of 3 mmHg. IAS/Shunts: No atrial level shunt detected by color flow Doppler. Additional Comments: A device lead is visualized.  LEFT VENTRICLE PLAX 2D LVIDd:         6.30 cm      Diastology LVIDs:         5.30 cm  LV e' medial:    7.51 cm/s LV PW:         1.10 cm      LV E/e' medial:  12.0 LV IVS:        1.00 cm      LV e' lateral:   10.90 cm/s LVOT diam:     2.20 cm      LV E/e' lateral: 8.3 LV SV:         69 LV SV Index:   34 LVOT Area:     3.80 cm  LV Volumes (MOD) LV vol d, MOD A2C: 187.0 ml LV vol d, MOD A4C: 201.0 ml LV vol s, MOD A2C: 116.0 ml LV vol s, MOD A4C: 155.0 ml LV SV MOD A2C:     71.0 ml LV SV MOD A4C:     201.0 ml LV SV MOD BP:      62.4 ml RIGHT VENTRICLE         IVC TAPSE (M-mode): 1.7 cm  IVC diam: 1.60 cm                          PULMONARY VEINS                         Diastolic Velocity: 44.70 cm/s                         S/D Velocity:       1.10                          Systolic Velocity:  47.90 cm/s LEFT ATRIUM             Index        RIGHT ATRIUM           Index LA diam:        4.50 cm 2.19 cm/m   RA Area:     19.50 cm LA Vol (A2C):   43.6 ml 21.22 ml/m  RA Volume:   57.30 ml  27.88 ml/m LA Vol (A4C):   52.8 ml 25.69 ml/m LA Biplane Vol: 46.9 ml 22.82 ml/m  AORTIC VALVE LVOT Vmax:   108.00 cm/s LVOT Vmean:  70.200 cm/s LVOT VTI:    0.182 m MITRAL VALVE               TRICUSPID VALVE MV Area (PHT): 4.68 cm    TR Peak grad:   20.6 mmHg MV Decel Time: 162 msec    TR Vmax:        227.00 cm/s MV E velocity: 90.10 cm/s MV A velocity: 40.90 cm/s  SHUNTS MV E/A ratio:  2.20        Systemic VTI:  0.18 m                            Systemic Diam: 2.20 cm Franck Azobou Tonleu Electronically signed by Joelle Cedars Tonleu Signature Date/Time: 06/24/2024/3:28:56 PM    Final     Cardiac Studies   Echo and left heart cath  Patient Profile     68 y.o. male   Assessment & Plan    Acute on chronic heart failure with midrange ejection fraction CAD s/p PCI Ischemic cardiomyopathy EF 40 to 45%  Type II NSTEMI Hx of Torsades s/p ICD Community-acquired pneumonia  Cardiac cath yesterday right heart pressure appropriate and with left side no indication for PCI.  Continue current dose of aspirin  and Ticagrelor .   Continue with current lipid lowering agent - target LDL <55 . On PCSK9 inhibitors in the outpatient setting   Hold duiretic today in the setting of worsenigng cr.   Restart Jardiance  ,   May be able to tolerate toprol  xl 12.5 mg daily - will start. Hold on starting Entresto  or ARB due to cr and marginal bp. Allergic to Aldactone  ( had not tried elperonone) but this can be considered in the outpatient setting.   He wants to go home as he is the primary caretaker for his wife.  For questions or updates, please contact [facility] Please consult [email or web address] for contact info under Cardiology/STEMI.      Signed, Mario Voong, DO   06/26/2024, 10:36 AM

## 2024-06-26 NOTE — Plan of Care (Signed)

## 2024-06-26 NOTE — TOC Transition Note (Signed)
 Transition of Care [facility]) - Discharge Note   Patient Details  Name: Stephen Jennings MRN: [id number] Date of Birth: 1956-06-06  Transition of Care [facility]) CM/SW Contact:  Roxie KANDICE Stain, RN Phone Number: 06/26/2024, 4:06 PM   Clinical Narrative:    Stephen Jennings is stable to discharge home. Follow up apt on AVS. No ICM (Inpatient Care Management) needs at this time.    Final next level of care: Home/Self Care Barriers to Discharge: Barriers Resolved   Patient Goals and CMS Choice Patient states their goals for this hospitalization and ongoing recovery are:: return home          Discharge Placement                 Home      Discharge Plan and Services Additional resources added to the After Visit Summary for                                       Social Drivers of Health (SDOH) Interventions SDOH Screenings   Food Insecurity: No Food Insecurity (06/23/2024)  Housing: Low Risk  (06/23/2024)  Transportation Needs: No Transportation Needs (06/23/2024)  Utilities: Not At Risk (06/23/2024)  Alcohol  Screen: Low Risk  (09/08/2023)  Depression (PHQ2-9): Low Risk  (09/08/2023)  Financial Resource Strain: Low Risk  (09/08/2023)  Physical Activity: Inactive (09/08/2023)  Social Connections: Moderately Isolated (06/23/2024)  Stress: No Stress Concern Present (09/08/2023)  Tobacco Use: Medium Risk (06/23/2024)  Health Literacy: Adequate Health Literacy (07/11/2023)     Readmission Risk Interventions    06/26/2024    4:06 PM  Readmission Risk Prevention Plan  Transportation Screening Complete  PCP or Specialist Appt within 5-7 Days Complete  Home Care Screening Complete  Medication Review (RN CM) Complete

## 2024-06-26 NOTE — Progress Notes (Signed)
 PROGRESS NOTE    Melecio Cueto  FMW:[id number] DOB: [birth year] DOA: 06/23/2024 PCP: Vicci Barnie NOVAK, MD    Brief Narrative:  68 year old with history of COPD and asthma, coronary artery disease status post PCI and LAD stenting, chronic combined heart failure, type 2 diabetes, hypertension woke up with acute respiratory distress.  EMS arrived, started on BiPAP for flash pulmonary edema.  On presentation he was on BiPAP, troponin 138, WC count 22.3.  BNP greater than 1600.  Admitted with diuresis, azithromycin  Rocephin.  Subjective: Patient feels well.  Denies any chest pain or shortness of breath.  Wondering if he can go home later today.   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  Now saturating normal on room air. Patient noted to be on aspirin , furosemide , metoprolol .  Monitor electrolytes and creatinine.  Slight increase in creatinine noted today.  Continue to monitor urine output.  Elevated troponin Troponin peaked at 5061 Patient was initially treated with IV heparin .  Currently on aspirin  and Brilinta .   Underwent cardiac catheterization.  No significant CAD was noted.  Medical management.  Community-acquired pneumonia: Suspected.  Ruled out.  WBC normalized.  Does not have any evidence of bacterial infection.  Cultures negative.  Respiratory virus panel negative.  Patient was treated with antibiotics which were discontinued.  WBC is normal.  No symptoms of pneumonia noted.    Type 2 diabetes with hyperglycemia: Monitor CBGs.  AKI: Recently known creatinine of 1.01.  Creatinine has been fluctuating.  Continue to monitor urine output.  Avoid nephrotoxic agents.  Supplement potassium.    Asthma /COPD: Remains optimized on bronchodilator therapy, Breztri  and Singulair .  DVT prophylaxis: Lovenox Code Status: Full  code Family Communication: Discussed with patient.  No family at bedside Disposition Plan: Hopefully home when cleared by cardiology  Consultants:  Cardiology  Procedures:  None  Antimicrobials:  Cefepime  and azithromycin  11/2--11/4   Objective: Vitals:   06/25/24 2301 06/26/24 0407 06/26/24 0814 06/26/24 0831  BP: 98/60 (!) 120/90 110/64   Pulse: 75 71 73   Resp: 19 18 16    Temp: 98.4 F (36.9 C) 97.9 F (36.6 C) 97.9 F (36.6 C)   TempSrc: Oral Oral Oral   SpO2: 91% 95%  94%  Weight:  84.2 kg    Height:        Intake/Output Summary (Last 24 hours) at 06/26/2024 1116 Last data filed at 06/26/2024 0640 Gross per 24 hour  Intake 1000 ml  Output 1900 ml  Net -900 ml   Filed Weights   06/24/24 0411 06/25/24 0600 06/26/24 0407  Weight: 91.9 kg 84.4 kg 84.2 kg    Examination:  General appearance: Awake alert.  In no distress Resp: Clear to auscultation bilaterally.  Normal effort Cardio: S1-S2 is normal regular.  No S3-S4.  No rubs murmurs or bruit GI: Abdomen is soft.  Nontender nondistended.  Bowel sounds are present normal.  No masses organomegaly Extremities: No edema.  Full range of motion of lower extremities. Neurologic: Alert and oriented x3.  No focal neurological deficits.    Data Reviewed:  CBC: Recent Labs  Lab 06/23/24 0401 06/23/24 0608 06/24/24 0207 06/25/24 0220 06/25/24 1609 06/25/24 1613 06/25/24 1615 06/26/24 0228  WBC 22.3*  --  14.2* 10.1  --   --   --  8.3  NEUTROABS  --   --  12.2*  --   --   --   --   --  HGB 14.2   < > 12.7* 14.5 14.6  16.0 16.0 16.0 15.4  HCT 45.3   < > 38.5* 43.9 43.0  47.0 47.0 47.0 46.8  MCV 94.8  --  91.4 90.5  --   --   --  90.2  PLT 415*  --  251 264  --   --   --  283   < > = values in this interval not displayed.   Basic Metabolic Panel: Recent Labs  Lab 06/23/24 0401 06/23/24 0608 06/23/24 0621 06/24/24 0207 06/25/24 0617 06/25/24 1609 06/25/24 1613 06/25/24 1615 06/26/24 0748  NA 138    < >  --  141 144 121*  142 142 142 138  K 4.4   < >  --  3.7 3.5 2.4*  3.5 3.5 3.6 3.5  CL 104  --   --  105 99  --   --   --  96*  CO2 23  --   --  25 27  --   --   --  27  GLUCOSE 360*  --   --  141* 110*  --   --   --  201*  BUN 16  --   --  26* 29*  --   --   --  31*  CREATININE 1.34*  --   --  1.34* 1.17  --   --   --  1.57*  CALCIUM  8.7*  --   --  8.4* 9.1  --   --   --  9.3  MG  --   --  2.3 2.1  --   --   --   --   --   PHOS  --   --  2.0* 4.2  --   --   --   --   --    < > = values in this interval not displayed.   GFR: Estimated Creatinine Clearance: 47.6 mL/min (A) (by C-G formula based on SCr of 1.57 mg/dL (H)).   CBG: Recent Labs  Lab 06/25/24 0557 06/25/24 1141 06/25/24 1648 06/25/24 2136 06/26/24 0632  GLUCAP 147* 127* 108* 130* 128*    Sepsis Labs: Recent Labs  Lab 06/23/24 0649 06/23/24 0821 06/23/24 1412  PROCALCITON  --  1.46  --   LATICACIDVEN 3.0*  --  3.0*    Recent Results (from the past 240 hours)  Blood culture (routine x 2)     Status: None (Preliminary result)   Collection Time: 06/23/24  5:29 AM   Specimen: BLOOD LEFT HAND  Result Value Ref Range Status   Specimen Description BLOOD LEFT HAND  Final   Special Requests   Final    BOTTLES DRAWN AEROBIC ONLY Blood Culture adequate volume   Culture   Final    NO GROWTH 3 DAYS Performed at [facility], [address] [place], [place] [address]    Report Status PENDING  Incomplete  Blood culture (routine x 2)     Status: None (Preliminary result)   Collection Time: 06/23/24  5:38 AM   Specimen: BLOOD RIGHT ARM  Result Value Ref Range Status   Specimen Description BLOOD RIGHT ARM  Final   Special Requests   Final    BOTTLES DRAWN AEROBIC AND ANAEROBIC Blood Culture adequate volume   Culture   Final    NO GROWTH 3 DAYS Performed at [facility], [address], [place], [place] [address]    Report Status PENDING  Incomplete  Resp panel by RT-PCR (RSV, Flu A&B, Covid)  Anterior Nasal Swab     Status: None   Collection Time: 06/23/24  6:49 AM   Specimen: Anterior Nasal Swab  Result Value Ref Range Status   SARS Coronavirus 2 by RT PCR NEGATIVE NEGATIVE Final   Influenza A by PCR NEGATIVE NEGATIVE Final   Influenza B by PCR NEGATIVE NEGATIVE Final    Comment: (NOTE) The Xpert Xpress SARS-CoV-2/FLU/RSV plus assay is intended as an aid in the diagnosis of influenza from Nasopharyngeal swab specimens and should not be used as a sole basis for treatment. Nasal washings and aspirates are unacceptable for Xpert Xpress SARS-CoV-2/FLU/RSV testing.  Fact Sheet for Patients: bloggercourse.com  Fact Sheet for Healthcare Providers: seriousbroker.it  This test is not yet approved or cleared by the [place]  FDA and has been authorized for detection and/or diagnosis of SARS-CoV-2 by FDA under an Emergency Use Authorization (EUA). This EUA will remain in effect (meaning this test can be used) for the duration of the COVID-19 declaration under Section 564(b)(1) of the Act, 21 U.S.C. section 360bbb-3(b)(1), unless the authorization is terminated or revoked.     Resp Syncytial Virus by PCR NEGATIVE NEGATIVE Final    Comment: (NOTE) Fact Sheet for Patients: bloggercourse.com  Fact Sheet for Healthcare Providers: seriousbroker.it  This test is not yet approved or cleared by the [place]  FDA and has been authorized for detection and/or diagnosis of SARS-CoV-2 by FDA under an Emergency Use Authorization (EUA). This EUA will remain in effect (meaning this test can be used) for the duration of the COVID-19 declaration under Section 564(b)(1) of the Act, 21 U.S.C. section 360bbb-3(b)(1), unless the authorization is terminated or revoked.  Performed at [facility], [address] [place], [place] [address]   Respiratory (~20 pathogens) panel by PCR      Status: None   Collection Time: 06/23/24  7:58 AM   Specimen: Nasopharyngeal Swab; Respiratory  Result Value Ref Range Status   Adenovirus NOT DETECTED NOT DETECTED Final   Coronavirus 229E NOT DETECTED NOT DETECTED Final    Comment: (NOTE) The Coronavirus on the Respiratory Panel, DOES NOT test for the novel  Coronavirus (2019 nCoV)    Coronavirus HKU1 NOT DETECTED NOT DETECTED Final   Coronavirus NL63 NOT DETECTED NOT DETECTED Final   Coronavirus OC43 NOT DETECTED NOT DETECTED Final   Metapneumovirus NOT DETECTED NOT DETECTED Final   Rhinovirus / Enterovirus NOT DETECTED NOT DETECTED Final   Influenza A NOT DETECTED NOT DETECTED Final   Influenza B NOT DETECTED NOT DETECTED Final   Parainfluenza Virus 1 NOT DETECTED NOT DETECTED Final   Parainfluenza Virus 2 NOT DETECTED NOT DETECTED Final   Parainfluenza Virus 3 NOT DETECTED NOT DETECTED Final   Parainfluenza Virus 4 NOT DETECTED NOT DETECTED Final   Respiratory Syncytial Virus NOT DETECTED NOT DETECTED Final   Bordetella pertussis NOT DETECTED NOT DETECTED Final   Bordetella Parapertussis NOT DETECTED NOT DETECTED Final   Chlamydophila pneumoniae NOT DETECTED NOT DETECTED Final   Mycoplasma pneumoniae NOT DETECTED NOT DETECTED Final    Comment: Performed at [facility] Lab, [address] [place], [place] [address]  MRSA Next Gen by PCR, Nasal     Status: None   Collection Time: 06/23/24  1:46 PM   Specimen: Nasal Mucosa; Nasal Swab  Result Value Ref Range Status   MRSA by PCR Next Gen NOT DETECTED NOT DETECTED Final    Comment: (NOTE) The  GeneXpert MRSA Assay (FDA approved for NASAL specimens only), is one component of a comprehensive MRSA colonization surveillance program. It is not intended to diagnose MRSA infection nor to guide or monitor treatment for MRSA infections. Test performance is not FDA approved in patients less than 12 years old. Performed at [facility], [address] [place],  [place] [address]          Radiology Studies: CARDIAC CATHETERIZATION Result Date: 06/25/2024 Images from the original result were not included. 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        Mid 40% disease RCA: Prox 20% disease LVEDP 10 mmHg Right heart catheterization 06/25/2024: RA: 4 mmHg RV: 28/0 mmHg PA: 23/10 mmHg, mPAP 16 mmHg PCW: 6 mmHg AO sats: 95% PA sats: 67% CO: 4.9 L/min CI: 2.4 L/min/m2 Conclusion: Moderate nonobstructive coronary artery disease Compensated nonischemic cardiomyopathy Recommendation: GDMT for HFrEF Newman JINNY Lawrence, MD   ECHOCARDIOGRAM COMPLETE Result Date: 06/24/2024    ECHOCARDIOGRAM REPORT   Patient Name:   EUEL CASTILE Date of Exam: 06/24/2024 Medical Rec #:  [id number]     Height:       68.0 in Accession #:    [id number]    Weight:       202.6 lb Date of Birth:  1956/05/04     BSA:          2.055 m Patient Age:    68 years      BP:           111/79 mmHg Patient Gender: M             HR:           72 bpm. Exam Location:  Inpatient Procedure: 2D Echo, Cardiac Doppler, Color Doppler and Intracardiac            Opacification Agent (Both Spectral and Color Flow Doppler were            utilized during procedure). Indications:    I50.40* Unspecified combined systolic (congestive) and diastolic                 (congestive) heart failure  History:        Patient has prior history of Echocardiogram examinations, most                 recent 01/31/2024. CHF, CAD and Previous Myocardial Infarction,                 Defibrillator and Abnormal ECG, COPD, Signs/Symptoms:Shortness                 of Breath and Dyspnea; Risk Factors:Diabetes, Dyslipidemia and                 Current Smoker.  Sonographer:    Ellouise Mose RDCS Referring Phys: [id number] CAROLE N HALL IMPRESSIONS  1. There is hypokinesis of mid-distal anterior wall, apex, and  distal anterolateral wall. Anteroseptal wall not well visualized. There is swirling coagulum present in LV apex with Definity  suggestive of early thrombus formation. Left ventricular ejection fraction, by estimation, is 30 to 35%. The left ventricle has moderately decreased function. The left ventricle demonstrates regional wall motion abnormalities (see scoring diagram/findings for description). The left ventricular internal cavity size was severely dilated. There is moderate eccentric left  ventricular hypertrophy. Left ventricular diastolic parameters were normal.  2. Right ventricular systolic function is low normal. The right ventricular size is moderately enlarged. There is normal pulmonary artery systolic pressure. The estimated right ventricular systolic pressure is 23.6 mmHg.  3. Lead in RA.  4. The mitral valve is normal in structure. Mild mitral valve regurgitation. No evidence of mitral stenosis.  5. The aortic valve is tricuspid. Aortic valve regurgitation is not visualized. No aortic stenosis is present.  6. The inferior vena cava is normal in size with greater than 50% respiratory variability, suggesting right atrial pressure of 3 mmHg. Comparison(s): Compared to prior echo, EF is slightly lower and there is evidence of early LV thrombus formation. FINDINGS  Left Ventricle: There is hypokinesis of mid-distal anterior wall, apex, and distal anterolateral wall. Anteroseptal wall not well visualized. There is swirling coagulum present in LV apex with Definity  suggestive of early thrombus formation. Left ventricular ejection fraction, by estimation, is 30 to 35%. The left ventricle has moderately decreased function. The left ventricle demonstrates regional wall motion abnormalities. Definity  contrast agent was given IV to delineate the left ventricular endocardial borders. The left ventricular internal cavity size was severely dilated. There is moderate eccentric left ventricular hypertrophy. Left  ventricular diastolic parameters were normal.  LV Wall Scoring: The mid and distal anterior wall, apical lateral segment, and apex are akinetic. The inferior septum, entire inferior wall, posterior wall, and mid anterolateral segment are hypokinetic. The basal anterolateral segment and basal anterior segment are normal. Right Ventricle: The right ventricular size is moderately enlarged. No increase in right ventricular wall thickness. Right ventricular systolic function is low normal. There is normal pulmonary artery systolic pressure. The tricuspid regurgitant velocity  is 2.27 m/s, and with an assumed right atrial pressure of 3 mmHg, the estimated right ventricular systolic pressure is 23.6 mmHg. Left Atrium: Left atrial size was normal in size. Right Atrium: Lead in RA. Right atrial size was normal in size. Pericardium: There is no evidence of pericardial effusion. Mitral Valve: The mitral valve is normal in structure. Mild mitral valve regurgitation. No evidence of mitral valve stenosis. Tricuspid Valve: The tricuspid valve is normal in structure. Tricuspid valve regurgitation is trivial. No evidence of tricuspid stenosis. Aortic Valve: The aortic valve is tricuspid. Aortic valve regurgitation is not visualized. No aortic stenosis is present. Pulmonic Valve: The pulmonic valve was not well visualized. Pulmonic valve regurgitation is not visualized. No evidence of pulmonic stenosis. Aorta: The aortic root is normal in size and structure. Venous: The inferior vena cava is normal in size with greater than 50% respiratory variability, suggesting right atrial pressure of 3 mmHg. IAS/Shunts: No atrial level shunt detected by color flow Doppler. Additional Comments: A device lead is visualized.  LEFT VENTRICLE PLAX 2D LVIDd:         6.30 cm      Diastology LVIDs:         5.30 cm      LV e' medial:    7.51 cm/s LV PW:         1.10 cm      LV E/e' medial:  12.0 LV IVS:        1.00 cm      LV e' lateral:   10.90 cm/s LVOT  diam:     2.20 cm      LV E/e' lateral: 8.3 LV SV:         69 LV SV Index:   34 LVOT Area:  3.80 cm  LV Volumes (MOD) LV vol d, MOD A2C: 187.0 ml LV vol d, MOD A4C: 201.0 ml LV vol s, MOD A2C: 116.0 ml LV vol s, MOD A4C: 155.0 ml LV SV MOD A2C:     71.0 ml LV SV MOD A4C:     201.0 ml LV SV MOD BP:      62.4 ml RIGHT VENTRICLE         IVC TAPSE (M-mode): 1.7 cm  IVC diam: 1.60 cm                          PULMONARY VEINS                         Diastolic Velocity: 44.70 cm/s                         S/D Velocity:       1.10                         Systolic Velocity:  47.90 cm/s LEFT ATRIUM             Index        RIGHT ATRIUM           Index LA diam:        4.50 cm 2.19 cm/m   RA Area:     19.50 cm LA Vol (A2C):   43.6 ml 21.22 ml/m  RA Volume:   57.30 ml  27.88 ml/m LA Vol (A4C):   52.8 ml 25.69 ml/m LA Biplane Vol: 46.9 ml 22.82 ml/m  AORTIC VALVE LVOT Vmax:   108.00 cm/s LVOT Vmean:  70.200 cm/s LVOT VTI:    0.182 m MITRAL VALVE               TRICUSPID VALVE MV Area (PHT): 4.68 cm    TR Peak grad:   20.6 mmHg MV Decel Time: 162 msec    TR Vmax:        227.00 cm/s MV E velocity: 90.10 cm/s MV A velocity: 40.90 cm/s  SHUNTS MV E/A ratio:  2.20        Systemic VTI:  0.18 m                            Systemic Diam: 2.20 cm Franck Azobou Tonleu Electronically signed by Joelle Cedars Tonleu Signature Date/Time: 06/24/2024/3:28:56 PM    Final     Scheduled Meds:  aspirin  EC  81 mg Oral Daily   budesonide-glycopyrrolate-formoterol   2 puff Inhalation BID   enoxaparin (LOVENOX) injection  40 mg Subcutaneous Daily   ezetimibe   10 mg Oral Daily   furosemide   80 mg Intravenous BID   insulin  aspart  0-9 Units Subcutaneous TID AC & HS   insulin  glargine-yfgn  5 Units Subcutaneous Daily   metoprolol  succinate  12.5 mg Oral Daily   montelukast   10 mg Oral QHS   potassium chloride   40 mEq Oral BID   sodium chloride  flush  3 mL Intravenous Q12H   ticagrelor   90 mg Oral BID   Continuous Infusions:  sodium  chloride       LOS: 3 days   Joette Pebbles, MD [facility]

## 2024-06-26 NOTE — Progress Notes (Signed)
 Notified by nursing patient wanting to leave. He was told by Dr. Sheena he would be discharged today. Patient is on the medicine service. There was concern about rising creatinine following cardiac catheterization. He received IV Lasix  through this morning. 2d echo 06/24/24 raised question of possible early LV thrombus formation. Primary team was needing further medication recommendations. I discussed with Dr. Lonni as Dr. Sheena was off call. She looked at the echo images and felt that there is a possibility this could reflect trabeculation rather than LV thrombus. Given need for concomitant lifelong DAPT as well as not clear cut diagnosis of LV thrombus, she recommends additional imaging before committing to anticoagulation. The patient is adamant he is going home. He is unwilling to stay for any additional testing and was upset at his experience here today. Per discussion with Dr. Lonni, we have arranged for a repeat limited echo on Monday (will need to get LV apical images with and without definity /entered in order comments) at 12pm followed by appointment with PA at 2:15pm. I outlined these on AVS. Patient agreeable to plan. Until follow-up reassessment, we would suggest that we hold Lasix , Entresto , eperonone, Jardiance  until trajectory of renal function is verified to have stabilized - will need labs at that visit when provider sees them. Continue new low dose metoprolol  in place of PTA bisoprolol . ASA was missing from med list, reinforced need to continue ASA 81mg  daily plus Brilinta  as per prior outlined plans. Patient is adamant he does not need any refills of anything at this time. Recommendations relayed to Dr. Verdene.

## 2024-06-26 NOTE — Care Management Important Message (Signed)
 Important Message  Patient Details  Name: Stephen Jennings MRN: [id number] Date of Birth: 02-21-1956   Important Message Given:  Yes - Medicare IM     Claretta Deed 06/26/2024, 3:08 PM

## 2024-06-26 NOTE — Discharge Summary (Signed)
 [facility]  Physician Discharge Summary   Patient ID: Stephen Jennings MRN: [id number] DOB/AGE: [birth year] 68 y.o.  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 EMS arrived, started on BiPAP for flash pulmonary edema. On presentation he was on BiPAP, troponin 138, WC count 22.3. BNP greater than 1600. Admitted with diuresis, azithromycin  Rocephin.   HOSPITAL COURSE:   Acute on chronic combined congestive heart failure, nonischemic cardiomyopathy with EF 40 to 45% Non-STEMI in a patient with known coronary artery disease Required BiPAP initially.  Now saturating normal on room air. Patient noted to be on aspirin , furosemide , metoprolol .  Monitor electrolytes and creatinine.  Slight increase in creatinine noted today.  Initially plan was to repeat his labs tomorrow but patient very keen on going home.  Discussed with cardiology who are okay for discharge today.  LV thrombus concern raised for LV thrombus.  Reviewed by cardiology.  Could be trabeculations.  They will arrange for close follow-up with repeat echocardiogram early next week since patient is very keen on going home today.  They did discuss with him about staying another day but he refused.   Elevated  troponin Troponin peaked at 5061 Patient was initially treated with IV heparin .  Currently on aspirin  and Brilinta .   Underwent cardiac catheterization.  No significant CAD was noted.  Medical management.   Community-acquired pneumonia: Suspected.  Ruled out.  WBC normalized.  Does not have any evidence of bacterial infection.  Cultures negative.  Respiratory virus panel negative.  Patient was treated with antibiotics which were discontinued.  WBC is normal.  No symptoms of pneumonia noted.     Type 2 diabetes with hyperglycemia   AKI: Recently known creatinine of 1.01.  Creasing creatinine noted.  Cardiology to arrange close follow-up for labs.   Asthma /COPD: Remains optimized on bronchodilator therapy, Breztri  and Singulair .  Patient not willing to stay in the hospital another day.  Discussed with cardiology who have discussed with the patient.  Patient discharged in stable condition with close outpatient follow-up.   PERTINENT LABS:  The results of significant diagnostics from this hospitalization (including imaging, microbiology, ancillary and laboratory) are listed below for reference.    Microbiology: Recent Results (from the past 240 hours)  Blood culture (routine x 2)     Status: None (Preliminary result)   Collection Time: 06/23/24  5:29 AM   Specimen: BLOOD LEFT HAND  Result Value Ref Range Status   Specimen Description BLOOD LEFT HAND  Final   Special Requests   Final    BOTTLES DRAWN AEROBIC ONLY Blood Culture adequate volume   Culture   Final    NO GROWTH 4 DAYS Performed at [facility], [address], [place], [place] [address]    Report Status PENDING  Incomplete  Blood culture (routine x 2)     Status: None (Preliminary result)   Collection Time: 06/23/24  5:38 AM  Specimen: BLOOD RIGHT ARM  Result Value Ref Range Status   Specimen Description BLOOD RIGHT ARM  Final   Special Requests   Final    BOTTLES DRAWN AEROBIC AND ANAEROBIC Blood Culture  adequate volume   Culture   Final    NO GROWTH 4 DAYS Performed at [facility], [address], [place], [place] [address]    Report Status PENDING  Incomplete  Resp panel by RT-PCR (RSV, Flu A&B, Covid) Anterior Nasal Swab     Status: None   Collection Time: 06/23/24  6:49 AM   Specimen: Anterior Nasal Swab  Result Value Ref Range Status   SARS Coronavirus 2 by RT PCR NEGATIVE NEGATIVE Final   Influenza A by PCR NEGATIVE NEGATIVE Final   Influenza B by PCR NEGATIVE NEGATIVE Final    Comment: (NOTE) The Xpert Xpress SARS-CoV-2/FLU/RSV plus assay is intended as an aid in the diagnosis of influenza from Nasopharyngeal swab specimens and should not be used as a sole basis for treatment. Nasal washings and aspirates are unacceptable for Xpert Xpress SARS-CoV-2/FLU/RSV testing.  Fact Sheet for Patients: bloggercourse.com  Fact Sheet for Healthcare Providers: seriousbroker.it  This test is not yet approved or cleared by the [place]  FDA and has been authorized for detection and/or diagnosis of SARS-CoV-2 by FDA under an Emergency Use Authorization (EUA). This EUA will remain in effect (meaning this test can be used) for the duration of the COVID-19 declaration under Section 564(b)(1) of the Act, 21 U.S.C. section 360bbb-3(b)(1), unless the authorization is terminated or revoked.     Resp Syncytial Virus by PCR NEGATIVE NEGATIVE Final    Comment: (NOTE) Fact Sheet for Patients: bloggercourse.com  Fact Sheet for Healthcare Providers: seriousbroker.it  This test is not yet approved or cleared by the [place]  FDA and has been authorized for detection and/or diagnosis of SARS-CoV-2 by FDA under an Emergency Use Authorization (EUA). This EUA will remain in effect (meaning this test can be used) for the duration of the COVID-19 declaration under Section 564(b)(1) of the  Act, 21 U.S.C. section 360bbb-3(b)(1), unless the authorization is terminated or revoked.  Performed at [facility], [address], [place], [place] [address]   Respiratory (~20 pathogens) panel by PCR     Status: None   Collection Time: 06/23/24  7:58 AM   Specimen: Nasopharyngeal Swab; Respiratory  Result Value Ref Range Status   Adenovirus NOT DETECTED NOT DETECTED Final   Coronavirus 229E NOT DETECTED NOT DETECTED Final    Comment: (NOTE) The Coronavirus on the Respiratory Panel, DOES NOT test for the novel  Coronavirus (2019 nCoV)    Coronavirus HKU1 NOT DETECTED NOT DETECTED Final   Coronavirus NL63 NOT DETECTED NOT DETECTED Final   Coronavirus OC43 NOT DETECTED NOT DETECTED Final   Metapneumovirus NOT DETECTED NOT DETECTED Final   Rhinovirus / Enterovirus NOT DETECTED NOT DETECTED Final   Influenza A NOT DETECTED NOT DETECTED Final   Influenza B NOT DETECTED NOT DETECTED Final   Parainfluenza Virus 1 NOT DETECTED NOT DETECTED Final   Parainfluenza Virus 2 NOT DETECTED NOT DETECTED Final   Parainfluenza Virus 3 NOT DETECTED NOT DETECTED Final   Parainfluenza Virus 4 NOT DETECTED NOT DETECTED Final   Respiratory Syncytial Virus NOT DETECTED NOT DETECTED Final   Bordetella pertussis NOT DETECTED NOT DETECTED Final   Bordetella Parapertussis NOT DETECTED NOT DETECTED Final   Chlamydophila pneumoniae NOT DETECTED NOT DETECTED Final   Mycoplasma pneumoniae NOT DETECTED NOT  DETECTED Final    Comment: Performed at [facility], [address] [place], [place] [address]  MRSA Next Gen by PCR, Nasal     Status: None   Collection Time: 06/23/24  1:46 PM   Specimen: Nasal Mucosa; Nasal Swab  Result Value Ref Range Status   MRSA by PCR Next Gen NOT DETECTED NOT DETECTED Final    Comment: (NOTE) The GeneXpert MRSA Assay (FDA approved for NASAL specimens only), is one component of a comprehensive MRSA colonization surveillance program. It is not intended to diagnose  MRSA infection nor to guide or monitor treatment for MRSA infections. Test performance is not FDA approved in patients less than 31 years old. Performed at [facility], [address] [place], [place] [address]      Labs:   Basic Metabolic Panel: Recent Labs  Lab 06/23/24 0401 06/23/24 9391 06/23/24 9378 06/24/24 0207 06/25/24 0617 06/25/24 1609 06/25/24 1613 06/25/24 1615 06/26/24 0748  NA 138   < >  --  141 144 121*  142 142 142 138  K 4.4   < >  --  3.7 3.5 2.4*  3.5 3.5 3.6 3.5  CL 104  --   --  105 99  --   --   --  96*  CO2 23  --   --  25 27  --   --   --  27  GLUCOSE 360*  --   --  141* 110*  --   --   --  201*  BUN 16  --   --  26* 29*  --   --   --  31*  CREATININE 1.34*  --   --  1.34* 1.17  --   --   --  1.57*  CALCIUM  8.7*  --   --  8.4* 9.1  --   --   --  9.3  MG  --   --  2.3 2.1  --   --   --   --   --   PHOS  --   --  2.0* 4.2  --   --   --   --   --    < > = values in this interval not displayed.    CBC: Recent Labs  Lab 06/23/24 0401 06/23/24 0608 06/24/24 0207 06/25/24 0220 06/25/24 1609 06/25/24 1613 06/25/24 1615 06/26/24 0228  WBC 22.3*  --  14.2* 10.1  --   --   --  8.3  NEUTROABS  --   --  12.2*  --   --   --   --   --   HGB 14.2   < > 12.7* 14.5 14.6  16.0 16.0 16.0 15.4  HCT 45.3   < > 38.5* 43.9 43.0  47.0 47.0 47.0 46.8  MCV 94.8  --  91.4 90.5  --   --   --  90.2  PLT 415*  --  251 264  --   --   --  283   < > = values in this interval not displayed.   BNP: BNP (last 3 results) Recent Labs    12/26/23 0928 06/23/24 0401  BNP 515.9* 1,604.7*     CBG: Recent Labs  Lab 06/25/24 1141 06/25/24 1648 06/25/24 2136 06/26/24 0632 06/26/24 1119  GLUCAP 127* 108* 130* 128* 160*     IMAGING STUDIES CARDIAC CATHETERIZATION Result Date: 06/25/2024 Images from the original result were not included. 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        Mid 40% disease RCA: Prox 20% disease LVEDP 10 mmHg Right heart catheterization 06/25/2024: RA: 4 mmHg RV: 28/0 mmHg PA: 23/10 mmHg, mPAP 16 mmHg PCW: 6 mmHg AO sats: 95% PA sats: 67% CO: 4.9 L/min CI: 2.4 L/min/m2 Conclusion: Moderate nonobstructive coronary artery disease Compensated nonischemic cardiomyopathy Recommendation: GDMT for HFrEF Newman JINNY Lawrence, MD   ECHOCARDIOGRAM COMPLETE Result Date: 06/24/2024    ECHOCARDIOGRAM REPORT   Patient Name:   Stephen Jennings Date of Exam: 06/24/2024 Medical Rec #:  [id number]     Height:       68.0 in Accession #:    [id number]    Weight:       202.6 lb Date of Birth:  [birth year]     BSA:          2.055 m Patient Age:    68 years      BP:           111/79 mmHg Patient Gender: M             HR:           72 bpm. Exam Location:  Inpatient Procedure: 2D Echo, Cardiac Doppler, Color Doppler and Intracardiac            Opacification Agent (Both Spectral and Color Flow Doppler were            utilized during procedure). Indications:    I50.40* Unspecified combined systolic (congestive) and diastolic                 (congestive) heart failure  History:        Patient has prior history of Echocardiogram examinations, most                 recent 01/31/2024. CHF, CAD and Previous Myocardial Infarction,                 Defibrillator and Abnormal ECG, COPD, Signs/Symptoms:Shortness                 of Breath and Dyspnea; Risk Factors:Diabetes, Dyslipidemia and                 Current Smoker.  Sonographer:    Ellouise Mose RDCS Referring Phys: [id number] CAROLE N HALL IMPRESSIONS  1. There is hypokinesis of mid-distal anterior wall, apex, and distal anterolateral wall. Anteroseptal wall not well visualized. There is swirling coagulum present in LV apex with Definity  suggestive of early thrombus formation. Left ventricular ejection fraction, by estimation, is 30 to 35%. The left ventricle has  moderately decreased function. The left ventricle demonstrates regional wall motion abnormalities (see scoring diagram/findings for description). The left ventricular internal cavity size was severely dilated. There is moderate eccentric left ventricular hypertrophy. Left ventricular diastolic parameters were normal.  2. Right ventricular systolic function is low normal. The right ventricular size is moderately enlarged. There is normal pulmonary artery systolic pressure. The estimated right ventricular systolic pressure is 23.6 mmHg.  3. Lead in RA.  4. The mitral valve is normal in structure. Mild mitral valve regurgitation. No evidence of mitral stenosis.  5. The aortic valve is tricuspid. Aortic valve regurgitation is not visualized. No aortic stenosis is present.  6. The inferior vena cava is  normal in size with greater than 50% respiratory variability, suggesting right atrial pressure of 3 mmHg. Comparison(s): Compared to prior echo, EF is slightly lower and there is evidence of early LV thrombus formation. FINDINGS  Left Ventricle: There is hypokinesis of mid-distal anterior wall, apex, and distal anterolateral wall. Anteroseptal wall not well visualized. There is swirling coagulum present in LV apex with Definity  suggestive of early thrombus formation. Left ventricular ejection fraction, by estimation, is 30 to 35%. The left ventricle has moderately decreased function. The left ventricle demonstrates regional wall motion abnormalities. Definity  contrast agent was given IV to delineate the left ventricular endocardial borders. The left ventricular internal cavity size was severely dilated. There is moderate eccentric left ventricular hypertrophy. Left ventricular diastolic parameters were normal.  LV Wall Scoring: The mid and distal anterior wall, apical lateral segment, and apex are akinetic. The inferior septum, entire inferior wall, posterior wall, and mid anterolateral segment are hypokinetic. The basal  anterolateral segment and basal anterior segment are normal. Right Ventricle: The right ventricular size is moderately enlarged. No increase in right ventricular wall thickness. Right ventricular systolic function is low normal. There is normal pulmonary artery systolic pressure. The tricuspid regurgitant velocity  is 2.27 m/s, and with an assumed right atrial pressure of 3 mmHg, the estimated right ventricular systolic pressure is 23.6 mmHg. Left Atrium: Left atrial size was normal in size. Right Atrium: Lead in RA. Right atrial size was normal in size. Pericardium: There is no evidence of pericardial effusion. Mitral Valve: The mitral valve is normal in structure. Mild mitral valve regurgitation. No evidence of mitral valve stenosis. Tricuspid Valve: The tricuspid valve is normal in structure. Tricuspid valve regurgitation is trivial. No evidence of tricuspid stenosis. Aortic Valve: The aortic valve is tricuspid. Aortic valve regurgitation is not visualized. No aortic stenosis is present. Pulmonic Valve: The pulmonic valve was not well visualized. Pulmonic valve regurgitation is not visualized. No evidence of pulmonic stenosis. Aorta: The aortic root is normal in size and structure. Venous: The inferior vena cava is normal in size with greater than 50% respiratory variability, suggesting right atrial pressure of 3 mmHg. IAS/Shunts: No atrial level shunt detected by color flow Doppler. Additional Comments: A device lead is visualized.  LEFT VENTRICLE PLAX 2D LVIDd:         6.30 cm      Diastology LVIDs:         5.30 cm      LV e' medial:    7.51 cm/s LV PW:         1.10 cm      LV E/e' medial:  12.0 LV IVS:        1.00 cm      LV e' lateral:   10.90 cm/s LVOT diam:     2.20 cm      LV E/e' lateral: 8.3 LV SV:         69 LV SV Index:   34 LVOT Area:     3.80 cm  LV Volumes (MOD) LV vol d, MOD A2C: 187.0 ml LV vol d, MOD A4C: 201.0 ml LV vol s, MOD A2C: 116.0 ml LV vol s, MOD A4C: 155.0 ml LV SV MOD A2C:     71.0 ml  LV SV MOD A4C:     201.0 ml LV SV MOD BP:      62.4 ml RIGHT VENTRICLE         IVC TAPSE (M-mode): 1.7 cm  IVC diam: 1.60 cm  PULMONARY VEINS                         Diastolic Velocity: 44.70 cm/s                         S/D Velocity:       1.10                         Systolic Velocity:  47.90 cm/s LEFT ATRIUM             Index        RIGHT ATRIUM           Index LA diam:        4.50 cm 2.19 cm/m   RA Area:     19.50 cm LA Vol (A2C):   43.6 ml 21.22 ml/m  RA Volume:   57.30 ml  27.88 ml/m LA Vol (A4C):   52.8 ml 25.69 ml/m LA Biplane Vol: 46.9 ml 22.82 ml/m  AORTIC VALVE LVOT Vmax:   108.00 cm/s LVOT Vmean:  70.200 cm/s LVOT VTI:    0.182 m MITRAL VALVE               TRICUSPID VALVE MV Area (PHT): 4.68 cm    TR Peak grad:   20.6 mmHg MV Decel Time: 162 msec    TR Vmax:        227.00 cm/s MV E velocity: 90.10 cm/s MV A velocity: 40.90 cm/s  SHUNTS MV E/A ratio:  2.20        Systemic VTI:  0.18 m                            Systemic Diam: 2.20 cm Joelle Azobou Tonleu Electronically signed by Joelle Cedars Tonleu Signature Date/Time: 06/24/2024/3:28:56 PM    Final    DG Chest Portable 1 View Result Date: 06/23/2024 CLINICAL DATA:  Initial evaluation for acute shortness of breath. EXAM: PORTABLE CHEST 1 VIEW COMPARISON:  Prior radiograph from 07/16/2022. FINDINGS: Diffuse related pad overlies the right chest. Left-sided dual lead transvenous pacemaker/AICD in place with electrodes overlying the right atrium and right ventricle. Transverse heart size at the upper limits of normal. Mediastinal silhouette within normal limits. Lungs normally inflated. Perihilar vascular congestion with diffuse interstitial prominence, consistent with a degree of pulmonary interstitial edema. Superimposed patchy consolidative opacity at the right lower lobe, concerning for pneumonia. Probable trace right pleural effusion. No pneumothorax. Visualized osseous structures and soft tissues demonstrate no acute  finding. IMPRESSION: 1. Patchy consolidative right lower lobe opacity, concerning for pneumonia. 2. Underlying diffuse interstitial prominence, consistent with pulmonary interstitial edema. 3. Probable trace right pleural effusion. Electronically Signed   By: Morene Hoard M.D.   On: 06/23/2024 04:47    DISCHARGE EXAMINATION: See progress note from earlier today   DISPOSITION: Home  Discharge Instructions     (HEART FAILURE PATIENTS) Call MD:  Anytime you have any of the following symptoms: 1) 3 pound weight gain in 24 hours or 5 pounds in 1 week 2) shortness of breath, with or without a dry hacking cough 3) swelling in the hands, feet or stomach 4) if you have to sleep on extra pillows at night in order to breathe.   Complete by: As directed    Call MD for:  difficulty breathing, headache or visual disturbances   Complete  by: As directed    Call MD for:  extreme fatigue   Complete by: As directed    Call MD for:  persistant dizziness or light-headedness   Complete by: As directed    Call MD for:  persistant nausea and vomiting   Complete by: As directed    Call MD for:  severe uncontrolled pain   Complete by: As directed    Call MD for:  temperature >100.4   Complete by: As directed    Diet - low sodium heart healthy   Complete by: As directed    Increase activity slowly   Complete by: As directed          Allergies as of 06/26/2024       Reactions   Aldactone  [spironolactone ] Other (See Comments)   Gynecomastia    Lipitor  [atorvastatin ] Other (See Comments)   Myalgias Fatigue   Glucophage  [metformin ] Anxiety, Palpitations        Medication List     STOP taking these medications    bisoprolol  5 MG tablet Commonly known as: ZEBETA    Entresto  97-103 MG Generic drug: sacubitril -valsartan    eplerenone  25 MG tablet Commonly known as: INSPRA    ezetimibe  10 MG tablet Commonly known as: ZETIA    furosemide  20 MG tablet Commonly known as: LASIX     potassium chloride  SA 20 MEQ tablet Commonly known as: Klor-Con  M20   pravastatin  80 MG tablet Commonly known as: Pravachol        TAKE these medications    albuterol  (2.5 MG/3ML) 0.083% nebulizer solution Commonly known as: PROVENTIL  Take 3 mLs (2.5 mg total) by nebulization every 6 (six) hours as needed for wheezing or shortness of breath.   albuterol  108 (90 Base) MCG/ACT inhaler Commonly known as: VENTOLIN  HFA INHALE 2 PUFFS BY MOUTH EVERY 6 HOURS AS NEEDED FOR WHEEZING FOR SHORTNESS OF BREATH   aspirin  EC 81 MG tablet Take 1 tablet (81 mg total) by mouth daily. Swallow whole.   Breztri  Aerosphere 160-9-4.8 MCG/ACT Aero inhaler Generic drug: budesonide-glycopyrrolate-formoterol  Inhale 2 puffs into the lungs 2 (two) times daily.   fluticasone  50 MCG/ACT nasal spray Commonly known as: FLONASE  USE 1 SPRAY IN EACH NOSTRIL ONCE DAILY AS NEEDED FOR ALLERGIES OR RHINITIS   Jardiance  10 MG Tabs tablet Generic drug: empagliflozin  Take 1 tablet (10 mg total) by mouth daily. Must have office visit for refills   loratadine  10 MG tablet Commonly known as: CLARITIN  Take 1 tablet by mouth once daily   metoprolol  succinate 25 MG 24 hr tablet Commonly known as: TOPROL -XL Take 0.5 tablets (12.5 mg total) by mouth daily.   montelukast  10 MG tablet Commonly known as: SINGULAIR  TAKE ONE TABLET BY MOUTH DAILY AT BEDTIME   nitroGLYCERIN  0.4 MG SL tablet Commonly known as: NITROSTAT  Place 1 tablet (0.4 mg total) under the tongue every 5 (five) minutes x 3 doses as needed for chest pain.   omeprazole  20 MG capsule Commonly known as: PRILOSEC Take 1 capsule (20 mg total) by mouth daily.   Repatha  SureClick 140 MG/ML Soaj Generic drug: Evolocumab  Inject 140 mg into the skin every 14 (fourteen) days.   ticagrelor  90 MG Tabs tablet Commonly known as: Brilinta  Take 1 tablet (90 mg total) by mouth 2 (two) times daily.   traZODone  50 MG tablet Commonly known as: DESYREL  TAKE HALF  TABLET BY MOUTH AT BEDTIME          Follow-up Information     [facility]  Follow up.   Specialty: Cardiology Why: Advanced Heart Failure Clinic follow-up at [facility] on Friday Jul 05, 2024 at 3:00 PM. Arrive 15 minutes prior to appointment to check in. Contact information: [address] [place]  [phone]        [facility] Dept of [employer]. [facility] Follow up.   Specialty: Cardiology Why: VERY IMPORTANT CLOSE FOLLOW-UP at [facility] location on Monday, November 10th, 2025  - Heart ultrasound scheduled at 12pm - please arrive by 11:45 am to check in. - Appointment at 2:15pm with PA Thom Sluder - check in by 1:55pm. Thom is one of our PAs that works with our cardiology team. Contact information: [address] [place]  [address] [phone]                TOTAL DISCHARGE TIME: 35 minutes  Joette Pebbles  [facility] Pager on [email or web address]  06/27/2024, 10:16 AM

## 2024-06-26 NOTE — Progress Notes (Signed)
Went over discharge paper work with patient. All questions answered. PIV and telemetry removed. All belongings at bedside.  

## 2024-06-26 NOTE — Progress Notes (Signed)
 Mobility Specialist Progress Note:    06/26/24 1414  Mobility  Activity Ambulated with assistance  Level of Assistance Independent after set-up  Assistive Device None  Distance Ambulated (ft) 200 ft  Activity Response Tolerated well  Mobility Referral Yes  Mobility visit 1 Mobility  Mobility Specialist Start Time (ACUTE ONLY) 1414  Mobility Specialist Stop Time (ACUTE ONLY) 1421  Mobility Specialist Time Calculation (min) (ACUTE ONLY) 7 min   Pt received in bed, agreeable to mobility session. Ambulated in hallway, SV, No AD required. Tolerated well, asx throughout. Max HR 91 bpm. Returned pt to room, eager for d/c.    Asad Keeven Mobility Specialist Please contact via SecureChat or  Rehab office at [phone]

## 2024-06-27 ENCOUNTER — Telehealth: Payer: Self-pay | Admitting: *Deleted

## 2024-06-27 ENCOUNTER — Other Ambulatory Visit: Payer: Self-pay | Admitting: Internal Medicine

## 2024-06-27 ENCOUNTER — Other Ambulatory Visit: Payer: Self-pay

## 2024-06-27 DIAGNOSIS — J439 Emphysema, unspecified: Secondary | ICD-10-CM

## 2024-06-27 NOTE — Telephone Encounter (Unsigned)
 Copied from CRM #[id number]. Topic: Clinical - Medication Refill >> Jun 27, 2024  2:03 PM Zebedee SAUNDERS wrote: Medication: montelukast  (SINGULAIR ) 10 MG tablet  Has the patient contacted their pharmacy? Yes (Agent: If no, request that the patient contact the pharmacy for the refill. If patient does not wish to contact the pharmacy document the reason why and proceed with request.) (Agent: If yes, when and what did the pharmacy advise?)Pharmacy need approval.   This is the patient's preferred pharmacy:  [facility] # [address] [place] - [address] [place] [address] Phone: [phone] Fax: [phone]  Is this the correct pharmacy for this prescription? Yes If no, delete pharmacy and type the correct one.   Has the prescription been filled recently? Yes  Is the patient out of the medication? Yes  Has the patient been seen for an appointment in the last year OR does the patient have an upcoming appointment? Yes  Can we respond through MyChart? Yes  Agent: Please be advised that Rx refills may take up to 3 business days. We ask that you follow-up with your pharmacy.

## 2024-06-27 NOTE — Transitions of Care (Post Inpatient/ED Visit) (Signed)
   06/27/2024  Name: Stephen Jennings MRN: [id number] DOB: 06-30-1956  Today's TOC FU Call Status: Today's TOC FU Call Status:: Unsuccessful Call (1st Attempt) Unsuccessful Call (1st Attempt) Date: 06/27/24  Attempted to reach the patient regarding the most recent Inpatient/ED visit.  Follow Up Plan: Additional outreach attempts will be made to reach the patient to complete the Transitions of Care (Post Inpatient/ED visit) call.   Andrea Dimes RN, BSN [facility]  [facility] RN Care Manager [phone]

## 2024-06-28 ENCOUNTER — Telehealth: Payer: Self-pay | Admitting: *Deleted

## 2024-06-28 LAB — CULTURE, BLOOD (ROUTINE X 2)
Culture: NO GROWTH
Culture: NO GROWTH
Special Requests: ADEQUATE
Special Requests: ADEQUATE

## 2024-06-28 NOTE — Telephone Encounter (Signed)
 Called pt - left message on machine to return our call and set an appointment.

## 2024-06-28 NOTE — Telephone Encounter (Signed)
 Requested medications are due for refill today.  yes  Requested medications are on the active medications list.  yes  Last refill. 05/15/2024 #30 0 rf  Future visit scheduled.   no  Notes to clinic.  Called pt and left message to call back and schedule an OV.    Requested Prescriptions  Pending Prescriptions Disp Refills   montelukast  (SINGULAIR ) 10 MG tablet 30 tablet 0    Sig: Take 1 tablet (10 mg total) by mouth at bedtime.     Pulmonology:  Leukotriene Inhibitors Passed - 06/28/2024  3:43 PM      Passed - Valid encounter within last 12 months    Recent Outpatient Visits           9 months ago Type 2 diabetes mellitus with obesity   [facility] [place]. [facility] Vicci Sober B, MD   1 year ago Controlled type 2 diabetes mellitus with other circulatory complication, without long-term current use of insulin    [facility] [place]. [facility] Vicci Sober B, MD   1 year ago Type 2 diabetes mellitus with other circulatory complication, with long-term current use of insulin  [facility])   [facility] [place]. [facility] Vicci Sober NOVAK, MD   1 year ago Type 2 diabetes mellitus with obesity   [facility] [place]. [facility] Vicci Sober NOVAK, MD   1 year ago Upper respiratory tract infection, unspecified type   [facility] Of [place]. [facility], Jon HERO, [name]       Future Appointments             In 3 days Darryle Thom CROME, [name] [facility] [employer] [facility] [employer]. [facility] [employer], [employer]

## 2024-06-28 NOTE — Transitions of Care (Post Inpatient/ED Visit) (Signed)
 06/28/2024  Name: Stephen Jennings MRN: [id number] DOB: 1956-05-27  Today's TOC FU Call Status: Today's TOC FU Call Status:: Successful TOC FU Call Completed TOC FU Call Complete Date: 06/28/24 Patient's Name and Date of Birth confirmed.  Transition Care Management Follow-up Telephone Call Date of Discharge: 06/26/24 Discharge Facility: [facility]) Type of Discharge: Inpatient Admission Primary Inpatient Discharge Diagnosis:: Acute on chronic systolic (congestive) heart failure How have you been since you were released from the hospital?: Better Any questions or concerns?: No  Items Reviewed: Did you receive and understand the discharge instructions provided?: Yes Medications obtained,verified, and reconciled?: Yes (Medications Reviewed) Any new allergies since your discharge?: No Dietary orders reviewed?: Yes Type of Diet Ordered:: low sodium heart healthy Do you have support at home?: Yes People in Home [RPT]: spouse Name of Support/Comfort Primary Source: Reda/Spouse  Medications Reviewed Today: Medications Reviewed Today     Reviewed by Lucky Andrea LABOR, RN (Registered Nurse) on 06/28/24 at 1209  Med List Status: <None>   Medication Order Taking? Sig Documenting Provider Last Dose Status Informant  albuterol  (PROVENTIL ) (2.5 MG/3ML) 0.083% nebulizer solution [id number] Yes Take 3 mLs (2.5 mg total) by nebulization every 6 (six) hours as needed for wheezing or shortness of breath. Doretha Folks, MD  Active Self, Pharmacy Records  albuterol  (VENTOLIN  HFA) 108 [phone] Base) MCG/ACT inhaler [id number] Yes INHALE 2 PUFFS BY MOUTH EVERY 6 HOURS AS NEEDED FOR WHEEZING FOR SHORTNESS OF BREATH Hunsucker, Donnice SAUNDERS, MD  Active Self, Pharmacy Records  aspirin  EC 81 MG tablet [id number] Yes Take 1 tablet (81 mg total) by mouth daily. Swallow whole. Krishnan, Gokul, MD  Active   budesonide-glycopyrrolate-formoterol  (BREZTRI  AEROSPHERE) 160-9-4.8 MCG/ACT AERO inhaler [id number] Yes Inhale 2  puffs into the lungs 2 (two) times daily. Hunsucker, Donnice SAUNDERS, MD  Active Self, Pharmacy Records  Evolocumab  (REPATHA  SURECLICK) 140 MG/ML EMMANUEL [id number] Yes Inject 140 mg into the skin every 14 (fourteen) days. Inocencio Soyla Lunger, MD  Active Self, Pharmacy Records  fluticasone  (FLONASE ) 50 MCG/ACT nasal spray [id number] Yes USE 1 SPRAY IN EACH NOSTRIL ONCE DAILY AS NEEDED FOR ALLERGIES OR RHINITIS Vicci Barnie NOVAK, MD  Active Self, Pharmacy Records  JARDIANCE  10 MG TABS tablet [id number]  Take 1 tablet (10 mg total) by mouth daily. Must have office visit for refills  Patient not taking: Reported on 06/28/2024   Vicci Barnie NOVAK, MD  Active Self, Pharmacy Records  loratadine  (CLARITIN ) 10 MG tablet [id number] Yes Take 1 tablet by mouth once daily Hunsucker, Donnice SAUNDERS, MD  Active Self, Pharmacy Records  metoprolol  succinate (TOPROL -XL) 25 MG 24 hr tablet [id number] Yes Take 0.5 tablets (12.5 mg total) by mouth daily. Krishnan, Gokul, MD  Active   montelukast  (SINGULAIR ) 10 MG tablet [id number]  TAKE ONE TABLET BY MOUTH DAILY AT BEDTIME  Patient not taking: Reported on 06/28/2024   Vicci Barnie NOVAK, MD  Active Self, Pharmacy Records  nitroGLYCERIN  (NITROSTAT ) 0.4 MG SL tablet [id number] Yes Place 1 tablet (0.4 mg total) under the tongue every 5 (five) minutes x 3 doses as needed for chest pain. Madie Jon Garre, PA  Active Self, Pharmacy Records           Med Note (COFFELL, JON HERO   Mon Jun 24, 2024  2:47 PM) Has available but never used from home supply.  omeprazole  (PRILOSEC) 20 MG capsule [id number]  Take 1 capsule (20 mg total) by mouth daily.  Patient not taking: Reported on 06/28/2024   Vicci Barnie NOVAK, MD  Active Self, Pharmacy Records  ticagrelor  (BRILINTA ) 90 MG TABS tablet [id number] Yes Take 1 tablet (90 mg total) by mouth 2 (two) times daily. Bensimhon, Toribio SAUNDERS, MD  Active Self, Pharmacy Records  traZODone  (DESYREL ) 50 MG tablet [id number] Yes TAKE HALF TABLET BY MOUTH AT BEDTIME  Vicci Barnie NOVAK, MD  Active Self, Pharmacy Records            Home Care and Equipment/Supplies: Were Home Health Services Ordered?: No Any new equipment or medical supplies ordered?: No  Functional Questionnaire: Do you need assistance with bathing/showering or dressing?: No Do you need assistance with meal preparation?: No Do you need assistance with eating?: No Do you have difficulty maintaining continence: No Do you need assistance with getting out of bed/getting out of a chair/moving?: No Do you have difficulty managing or taking your medications?: No  Follow up appointments reviewed: PCP Follow-up appointment confirmed?: Yes Date of PCP follow-up appointment?: 07/16/24 Follow-up Provider: Dr. Vicci Specialist [facility] Follow-up appointment confirmed?: Yes Date of Specialist follow-up appointment?: 07/01/24 Follow-Up Specialty Provider:: HF Clinic for ultrasound and provider visit Do you need transportation to your follow-up appointment?: No Do you understand care options if your condition(s) worsen?: Yes-patient verbalized understanding  SDOH Interventions Today    Flowsheet Row Most Recent Value  SDOH Interventions   Food Insecurity Interventions Intervention Not Indicated  Housing Interventions Intervention Not Indicated  Transportation Interventions Intervention Not Indicated  Utilities Interventions Intervention Not Indicated    Goals Addressed             This Visit's Progress    VBCI Transitions of Care (TOC) Care Plan       Problems:  Recent Hospitalization for treatment of CHF Knowledge Deficit Related to CHF  Goal:  Over the next 30 days, the patient will not experience hospital readmission  Interventions:  Transitions of Care: Durable Medical Equipment (DME) reviewed with patient/caregiver Doctor Visits  - discussed the importance of doctor visits Post discharge activity limitations prescribed by provider reviewed Medication Review,  discussed new medications and discontinued medications Advised patient to call for refills prior to running out of medication Advised patient to take medications to upcoming appointments SDOH assessment   Heart Failure Interventions: Basic overview and discussion of pathophysiology of Heart Failure reviewed Discussed importance of daily weight and advised patient to weigh and record daily Reviewed role of diuretics in prevention of fluid overload and management of heart failure; Discussed the importance of keeping all appointments with provider Provided patient with education about the role of exercise in the management of heart failure Assessed social determinant of health barriers   Patient Self Care Activities:  Attend all scheduled provider appointments Call pharmacy for medication refills 3-7 days in advance of running out of medications Call provider office for new concerns or questions  Notify RN Care Manager of TOC call rescheduling needs Participate in Transition of Care Program/Attend TOC scheduled calls Take medications as prescribed   use salt in moderation weigh myself daily bring diary to all appointments know when to call the doctor:weight gain of 2-3 # overnight or 5 # in a week dress right for the weather, hot or cold  Plan:  Telephone follow up appointment with care management team member scheduled for:  07/04/24 at 1pm        Andrea Dimes RN, BSN [place]  [facility] RN Care Manager [phone]

## 2024-07-01 ENCOUNTER — Inpatient Hospital Stay: Admitting: Cardiology

## 2024-07-01 ENCOUNTER — Encounter: Payer: Self-pay | Admitting: Cardiology

## 2024-07-01 ENCOUNTER — Inpatient Hospital Stay (HOSPITAL_COMMUNITY)
Admit: 2024-07-01 | Discharge: 2024-07-01 | Disposition: A | Attending: Physician Assistant | Admitting: Physician Assistant

## 2024-07-01 ENCOUNTER — Ambulatory Visit: Payer: Self-pay | Admitting: Physician Assistant

## 2024-07-01 ENCOUNTER — Inpatient Hospital Stay: Admitting: Physician Assistant

## 2024-07-01 VITALS — BP 128/74 | HR 66 | Ht 68.0 in | Wt 191.0 lb

## 2024-07-01 DIAGNOSIS — I2510 Atherosclerotic heart disease of native coronary artery without angina pectoris: Secondary | ICD-10-CM | POA: Diagnosis not present

## 2024-07-01 DIAGNOSIS — Z95810 Presence of automatic (implantable) cardiac defibrillator: Secondary | ICD-10-CM | POA: Diagnosis not present

## 2024-07-01 DIAGNOSIS — I513 Intracardiac thrombosis, not elsewhere classified: Secondary | ICD-10-CM | POA: Diagnosis not present

## 2024-07-01 DIAGNOSIS — I255 Ischemic cardiomyopathy: Secondary | ICD-10-CM

## 2024-07-01 DIAGNOSIS — I5020 Unspecified systolic (congestive) heart failure: Secondary | ICD-10-CM

## 2024-07-01 DIAGNOSIS — J449 Chronic obstructive pulmonary disease, unspecified: Secondary | ICD-10-CM

## 2024-07-01 DIAGNOSIS — E785 Hyperlipidemia, unspecified: Secondary | ICD-10-CM

## 2024-07-01 DIAGNOSIS — I10 Essential (primary) hypertension: Secondary | ICD-10-CM

## 2024-07-01 LAB — ECHOCARDIOGRAM LIMITED
Calc EF: 33.3 %
S' Lateral: 4.9 cm
Single Plane A2C EF: 32.7 %
Single Plane A4C EF: 33.6 %

## 2024-07-01 MED ORDER — PERFLUTREN LIPID MICROSPHERE
1.0000 mL | INTRAVENOUS | Status: AC | PRN
  Administered 2024-07-01: 2 mL via INTRAVENOUS

## 2024-07-01 MED ORDER — PRAVASTATIN SODIUM 80 MG PO TABS: 80.0000 mg | ORAL_TABLET | Freq: Every day | ORAL | 1 refills | Status: AC

## 2024-07-01 NOTE — Progress Notes (Signed)
 Cardiology Office Note:  .   Date:  07/01/2024  ID:  Stephen Jennings, DOB 1956/07/20, MRN [id number] PCP: Vicci Barnie NOVAK, MD  Stephen Jennings Cardiologist:  Dub Huntsman, DO Electrophysiologist:  Will Gladis Norton, MD {  History of Present Illness: .   Stephen Jennings is a 68 y.o. male with history of CAD with anterior STEMI with cardiac arrest complicated by cardiogenic shock with LAD PCI 2020, torsades status post ICD, ICM with reduced EF, COPD, type 2 diabetes.     Cardiomyopathy CAD 03/2019 admitted with anterior STEMI cardiac arrest/respiratory failure.  LHC three-vessel CAD and totally occluded proximal LAD.  LCx anomalous off RCA.  Underwent extensive PCI of LAD complicated by cardiogenic shock requiring Impella support and episode of torsades.  EF 20 to 25%.  ICD placed. 09/2019 EF 45 to 50% 11/2020 EF 55 to 60% 01/2021 admitted with NSTEMI, stopped Brilinta  2 months prior.  Cath with patent proximal LAD with 20% in-stent restenosis.  Significant thrombus burden immediately after the stent extending into the diagonal vessel and mid LAD with TIMI-3 flow.  Treated with Aggrastat  x 48 hours.  EF 35 to 40%.  Relook angiography of the left coronary system showed improvement in the previous thrombus burden in the diagonal and LAD distal stent and beyond but moderate residual thrombus was still present. Underwent successful percutaneous coronary intervention of the LAD utilizing Pronto thrombectomy, intravascular ultrasound and ultimate PTCA of the distal stented segment and mid LAD with residual narrowing 0%. Recommended indefinite DAPT.  07/2021 EF 35 to 40% 01/2024 EF 40 to 45% 06/2024 admitted with NSTEMI and acute respiratory failure requiring BiPAP. LA 3. PNA ruled out.  Underwent right and left heart catheterization with patent LAD stent, 20% late lumen loss.  20 to 40% nonobstructive disease in LCx/RCA.  Compensated right heart cath numbers.  Question of LV thrombus formation  versus trabeculation.  Limited echocardiogram recommended outpatient.  Social history  Former smoker.  Quit 5 years ago.  No alcohol  or drugs. Very active 5+ days per week.     Patient with complex cardiac history with ischemic cardiomyopathy due to anterior STEMI complicated by cardiogenic shock in 2020 leading to torsades status post ICD.  Because of NSTEMI in 2022 (not compliant with Brilinta ) with cath showing significant thrombus burden after the LAD stent extending into the diagonal vessels and mid LAD, he has been on DAPT therapy indefinitely.  He was most recently admitted again in 06/2024 for NSTEMI and acute flash pulmonary edema with troponins increasing up to 5000, lactic acidosis of 3,  underwent right left heart catheterization with overall nonobstructive disease and patent LAD stent.  On echocardiogram there was question of LV thrombus versus trabeculation so outpatient echocardiogram was ordered to further define this.  At discharge his Entresto  97-23 mg twice daily, eplerenone , Lasix , pravastatin  were all held due to elevated creatinine and soft BP.  Metoprolol  was replaced instead of bisoprolol .  Today patient presents for hospital follow-up.  He reports that he normally does exceptionally well and not functionally limited in any way.  He was active all summer helping his son with different projects and doing landscaping projects the entire day.  He said he was doing completely fine and then 1 night he woke up extremely short of breath out of nowhere.  Reported stable weights and no real changes in his current regiment.  Compliant with all of his medications.  He feels better today and back to his baseline.  Weight per  him typically is between 185 to 190 pounds.  He has no acute complaints today, hoping to get back on his GDMT.  ROS: Denies: Chest pain, shortness of breath, orthopnea, peripheral edema, palpitations, decreased exercise intolerance, fatigue, lightheadedness.   Studies  Reviewed: .         Risk Assessment/Calculations:             Physical Exam:   VS:  BP 128/74   Pulse 66   Ht 5' 8 (1.727 m)   Wt 191 lb (86.6 kg)   SpO2 97%   BMI 29.04 kg/m    Wt Readings from Last 6 Encounters:  07/01/24 191 lb (86.6 kg)  06/28/24 185 lb (83.9 kg)  06/26/24 185 lb 9.6 oz (84.2 kg)  05/29/24 198 lb 9.6 oz (90.1 kg)  05/02/24 196 lb 3.2 oz (89 kg)  12/26/23 198 lb (89.8 kg)    GEN: Well nourished, well developed in no acute distress NECK: No JVD; No carotid bruits CARDIAC: RRR, no murmurs, rubs, gallops RESPIRATORY:  Clear to auscultation without rales, wheezing or rhonchi  ABDOMEN: Soft, non-tender, non-distended EXTREMITIES:  No edema; No deformity   ASSESSMENT AND PLAN: .    Chronic HFrEF/ICM - Echo 8/20 EF 20-25% - Echo 09/23/19: EF 45-50% - Echo 4/22: EF 55-60%  - NSTEMI in 6/22 -> Echo 6/22 EF 35-40% - Echo  07/27/21 EF 35-40% .  - Echo 6/25 EF 40-45% - Echo 06/2024 EF 30 to 35%, question of trabeculation versus LV thrombus.  Echocardiogram performed today demonstrates no evidence of LV thrombus.  EF 30 to 35% with dyskinesis of entire apical segment consistent with prior infarct.  No significant valvular disease.  Typically he demonstrates NYHA class I symptoms and does very well.  His admission for NSTEMI and respiratory failure with lactic acidosis does not have clear precipitator.  Right left heart catheterization had demonstrated overall moderate nonobstructive disease with patent stents and compensated cardiomyopathy.  But today doing very well, euvolemic, stable weights around 185-190.  GDMT: Continue with Jardiance  10 mg, Toprol -XL 12.5 mg.  At discharge his Entresto  97-102 mg, bisoprolol  5 mg, eplerenone  25 mg, Lasix  20 mg were all stopped due to soft blood pressure and elevated creatinine. Will obtain CMP today with plans to titrate back his GDMT.  He will log blood pressure and daily weights until he sees advanced heart failure later this  week.  He is well-established with them so will defer medication management to them. Had gynecomastia with spironolactone  and he reported it made him feel jittery.  CAD  - Anterior MI 03/2019 with extensive PCI/DES to diffusely diseased LAD.  - NSTEMI in 01/2021 after stopping Brillinta. Stent patent but significant clot after stent -> s/p thrombectomy and POBA - NSTEMI 06/2024 patent LAD stent, 20% late lumen loss.  20 to 40% nonobstructive disease in LCx/RCA. Complex disease as above but seems to be stable and with no concerning anginal complaints today.  Compliant with all of his medications. He is on DAPT therapy with aspirin  and Brilinta  indefinitely.  Continue Repatha  and will restart his pravastatin  80 mg today. LDL at target goal less than 55.  04/2024 LDL 38.  Cardiac arrest/torsades - Post anterior MI complication 03/2019 ICD in place.  Consider interrogating device at follow-up for further clarification of his decompensated event.  Type 2 diabetes Well-controlled A1c 5.5% 06/2024.  COPD Follows with pulmonology.  Stable.    Hypertension Blood pressure good today 128/74.  Continue in the context above  and titrate GDMT as tolerated.    Dispo: Already has appointment with advanced heart failure later this week on Friday.  Continue to titrate GDMT as tolerated.  Signed, Thom LITTIE Sluder, [name]

## 2024-07-01 NOTE — Patient Instructions (Signed)
 Medication Instructions:  Continue Pravastatin .  *If you need a refill on your cardiac medications before your next appointment, please call your pharmacy*  Lab Work: Today : CMET If you have labs (blood work) drawn today and your tests are completely normal, you will receive your results only by: MyChart Message (if you have MyChart) OR A paper copy in the mail If you have any lab test that is abnormal or we need to change your treatment, we will call you to review the results.  Testing/Procedures: None  Follow-Up: At [facility], you and your health needs are our priority.  As part of our continuing mission to provide you with exceptional heart care, our providers are all part of one team.  This team includes your primary Cardiologist (physician) and Advanced Practice Providers or APPs (Physician Assistants and Nurse Practitioners) who all work together to provide you with the care you need, when you need it.  Your next appointment:   As scheduled     Provider:   Heart Failure Clinic  We recommend signing up for the patient portal called MyChart.  Sign up information is provided on this After Visit Summary.  MyChart is used to connect with patients for Virtual Visits (Telemedicine).  Patients are able to view lab/test results, encounter notes, upcoming appointments, etc.  Non-urgent messages can be sent to your provider as well.   To learn more about what you can do with MyChart, go to [email or web address].   Other Instructions Check weight and blood pressure daily.  Take readings to your scheduled heart failure clinic appointment.

## 2024-07-02 ENCOUNTER — Ambulatory Visit: Payer: Self-pay | Admitting: Cardiology

## 2024-07-02 LAB — COMPREHENSIVE METABOLIC PANEL WITH GFR
ALT: 24 IU/L (ref 0–44)
AST: 23 IU/L (ref 0–40)
Albumin: 4.4 g/dL (ref 3.9–4.9)
Alkaline Phosphatase: 92 IU/L (ref 47–123)
BUN/Creatinine Ratio: 16 (ref 10–24)
BUN: 18 mg/dL (ref 8–27)
Bilirubin Total: 1 mg/dL (ref 0.0–1.2)
CO2: 23 mmol/L (ref 20–29)
Calcium: 9.6 mg/dL (ref 8.6–10.2)
Chloride: 103 mmol/L (ref 96–106)
Creatinine, Ser: 1.1 mg/dL (ref 0.76–1.27)
Globulin, Total: 2.6 g/dL (ref 1.5–4.5)
Glucose: 89 mg/dL (ref 70–99)
Potassium: 4.9 mmol/L (ref 3.5–5.2)
Sodium: 140 mmol/L (ref 134–144)
Total Protein: 7 g/dL (ref 6.0–8.5)
eGFR: 73 mL/min/1.73 (ref >59)

## 2024-07-02 NOTE — Telephone Encounter (Signed)
 Spoke with pt regarding lab results. Pt verbalized understanding and agreed to plan. Pt was advise to  call us  if he has any questions.

## 2024-07-04 ENCOUNTER — Other Ambulatory Visit: Payer: Self-pay | Admitting: *Deleted

## 2024-07-04 ENCOUNTER — Telehealth (HOSPITAL_COMMUNITY): Payer: Self-pay

## 2024-07-04 NOTE — Patient Instructions (Signed)
 Visit Information  Thank you for taking time to visit with me today. Please don't hesitate to contact me if I can be of assistance to you before our next scheduled telephone appointment.   Following is a copy of your care plan:   Goals Addressed             This Visit's Progress    VBCI Transitions of Care (TOC) Care Plan       Problems:  Recent Hospitalization for treatment of CHF Knowledge Deficit Related to CHF  Goal:  Over the next 30 days, the patient will not experience hospital readmission  Interventions:  Transitions of Care: Durable Medical Equipment (DME) reviewed with patient/caregiver Doctor Visits  - discussed the importance of doctor visits Post discharge activity limitations prescribed by provider reviewed Medication Review, discussed new medications and discontinued medications Secure communication to Dr. Vicci regarding patient needing Omeprazole  and Montelukast  refills Advised patient to take medications to upcoming appointments Reviewed upcoming appointments including:07/05/24 with HF Clinic, 07/16/24 for AWV and 08/05/24 with PCP for Hospital follow up Discussed daily weights and BP-reviewed readings   Heart Failure Interventions: Discussed importance of daily weight and advised patient to weigh and record daily Reviewed role of diuretics in prevention of fluid overload and management of heart failure; Discussed the importance of keeping all appointments with provider  Patient Self Care Activities:  Attend all scheduled provider appointments Call pharmacy for medication refills 3-7 days in advance of running out of medications Call provider office for new concerns or questions  Notify RN Care Manager of TOC call rescheduling needs Participate in Transition of Care Program/Attend TOC scheduled calls Take medications as prescribed   use salt in moderation weigh myself daily bring diary to all appointments know when to call the doctor:weight gain of  2-3 # overnight or 5 # in a week dress right for the weather, hot or cold  Plan:  Telephone follow up appointment with care management team member scheduled for:  07/11/24 at 1pm        Patient verbalizes understanding of instructions and care plan provided today and agrees to view in MyChart. Active MyChart status and patient understanding of how to access instructions and care plan via MyChart confirmed with patient.     Telephone follow up appointment with care management team member scheduled for:07/11/24 at 1pm  Please call the care guide team at [phone] if you need to cancel or reschedule your appointment.   Please call 1-800-273-TALK (toll free, 24 hour hotline) go to [facility] Urgent [facility] [address] [place] [phone]) call the [facility] Line: [phone] if you are experiencing a Mental Health or Behavioral Health Crisis or need someone to talk to.  Andrea Dimes RN, BSN [place]  [facility] Health RN Care Manager [phone]

## 2024-07-04 NOTE — Transitions of Care (Post Inpatient/ED Visit) (Signed)
 Transition of Care week 2  Visit Note  07/04/2024  Name: Stephen Jennings MRN: [id number]          DOB: [birth year]  Situation: Patient enrolled in [facility] 30-day program. Visit completed with Stephen Jennings by telephone.   Background:   Initial Transition Care Management Follow-up Telephone Call Discharge Date and Diagnosis: 06/26/24, Acute on chronic systolic (congestive) heart failure   Past Medical History:  Diagnosis Date   Cardiogenic shock (HCC)    CHF (congestive heart failure) (HCC)    Coronary artery disease    Diabetes mellitus without complication (HCC)    Hyperlipidemia    Medical history non-contributory     Assessment: Patient Reported Symptoms: Cognitive Cognitive Status: Able to follow simple commands, Alert and oriented to person, place, and time, Normal speech and language skills      Neurological Neurological Review of Symptoms: No symptoms reported    HEENT HEENT Symptoms Reported: Other: (nasal congestion) HEENT Management Strategies: Adequate rest, Routine screening HEENT Self-Management Outcome: 3 (uncertain) HEENT Comment: Feel like I have a cold. RNCM discussed when to see his provider. Discussed options, virtual, Urgent Care if unable to see PCP.    Cardiovascular Cardiovascular Symptoms Reported: No symptoms reported Weight: 189 lb (85.7 kg) (home device) Cardiovascular Self-Management Outcome: 4 (good) Cardiovascular Comment: BP today 132/80. Patient has follow up with HF Clinic tomorrow.  Respiratory Respiratory Symptoms Reported: No symptoms reported Additional Respiratory Details: Denies breathing difficulties today Respiratory Management Strategies: Routine screening, Adequate rest, Coping strategies, Medication therapy Respiratory Self-Management Outcome: 4 (good)  Endocrine Endocrine Symptoms Reported: No symptoms reported Is patient diabetic?: Yes Is patient checking blood sugars at home?: No Endocrine Self-Management Outcome: 4 (good)   Gastrointestinal Gastrointestinal Symptoms Reported: No symptoms reported Additional Gastrointestinal Details: LBM 07/04/24      Genitourinary Genitourinary Symptoms Reported: No symptoms reported    Integumentary Integumentary Symptoms Reported: No symptoms reported    Musculoskeletal Musculoskelatal Symptoms Reviewed: No symptoms reported        Psychosocial Psychosocial Symptoms Reported: Not assessed         Vitals:   07/04/24 1343  BP: 132/80   Pain Score: 0-No pain  Medications Reviewed Today     Reviewed by Lucky Andrea LABOR, RN (Registered Nurse) on 07/04/24 at 1316  Med List Status: <None>   Medication Order Taking? Sig Documenting Provider Last Dose Status Informant  albuterol  (PROVENTIL ) (2.5 MG/3ML) 0.083% nebulizer solution [id number] Yes Take 3 mLs (2.5 mg total) by nebulization every 6 (six) hours as needed for wheezing or shortness of breath. Doretha Folks, MD  Active Self, Pharmacy Records  albuterol  (VENTOLIN  HFA) 108 [phone] Base) MCG/ACT inhaler [id number] Yes INHALE 2 PUFFS BY MOUTH EVERY 6 HOURS AS NEEDED FOR WHEEZING FOR SHORTNESS OF BREATH Hunsucker, Donnice SAUNDERS, MD  Active Self, Pharmacy Records  aspirin  EC 81 MG tablet [id number] Yes Take 1 tablet (81 mg total) by mouth daily. Swallow whole. Krishnan, Gokul, MD  Active   budesonide-glycopyrrolate-formoterol  (BREZTRI  AEROSPHERE) 160-9-4.8 MCG/ACT AERO inhaler [id number] Yes Inhale 2 puffs into the lungs 2 (two) times daily. Hunsucker, Donnice SAUNDERS, MD  Active Self, Pharmacy Records  Evolocumab  (REPATHA  SURECLICK) 140 MG/ML EMMANUEL [id number] Yes Inject 140 mg into the skin every 14 (fourteen) days. Inocencio Soyla Lunger, MD  Active Self, Pharmacy Records  fluticasone  (FLONASE ) 50 MCG/ACT nasal spray [id number] Yes USE 1 SPRAY IN EACH NOSTRIL ONCE DAILY AS NEEDED FOR ALLERGIES OR RHINITIS Vicci Barnie NOVAK, MD  Active Self, Pharmacy Records  JARDIANCE  10  MG TABS tablet [id number] Yes Take 1 tablet (10 mg total) by mouth  daily. Must have office visit for refills Vicci Barnie NOVAK, MD  Active Self, Pharmacy Records  loratadine  (CLARITIN ) 10 MG tablet [id number] Yes Take 1 tablet by mouth once daily Hunsucker, Donnice SAUNDERS, MD  Active Self, Pharmacy Records  metoprolol  succinate (TOPROL -XL) 25 MG 24 hr tablet [id number] Yes Take 0.5 tablets (12.5 mg total) by mouth daily. Krishnan, Gokul, MD  Active   montelukast  (SINGULAIR ) 10 MG tablet [id number]  TAKE ONE TABLET BY MOUTH DAILY AT BEDTIME  Patient not taking: Reported on 07/04/2024   Vicci Barnie NOVAK, MD  Active Self, Pharmacy Records  nitroGLYCERIN  (NITROSTAT ) 0.4 MG SL tablet [id number] Yes Place 1 tablet (0.4 mg total) under the tongue every 5 (five) minutes x 3 doses as needed for chest pain. Madie Jon Garre, PA  Active Self, Pharmacy Records           Med Note (COFFELL, JON HERO   Mon Jun 24, 2024  2:47 PM) Has available but never used from home supply.  omeprazole  (PRILOSEC) 20 MG capsule [id number]  Take 1 capsule (20 mg total) by mouth daily.  Patient not taking: Reported on 07/04/2024   Vicci Barnie NOVAK, MD  Active Self, Pharmacy Records  pravastatin  (PRAVACHOL ) 80 MG tablet [id number] Yes Take 1 tablet (80 mg total) by mouth daily. Darryle Thom CROME, [name]  Active   ticagrelor  (BRILINTA ) 90 MG TABS tablet [id number] Yes Take 1 tablet (90 mg total) by mouth 2 (two) times daily. Bensimhon, Toribio SAUNDERS, MD  Active Self, Pharmacy Records  traZODone  (DESYREL ) 50 MG tablet [id number] Yes TAKE HALF TABLET BY MOUTH AT BEDTIME Vicci Barnie NOVAK, MD  Active Self, Pharmacy Records            Recommendation:   Continue Current Plan of Care  Follow Up Plan:   Telephone follow-up in 1 week  Andrea Dimes RN, BSN [place]  [facility] RN Care Manager [phone]

## 2024-07-04 NOTE — Telephone Encounter (Signed)
 Called to confirm/remind patient of their appointment at the [facility] on 07/05/24 3:00.   Appointment:   [] Confirmed  [x] Left mess   [] No answer/No voice mail  [] VM Full/unable to leave message  [] Phone not in service  Patient reminded to bring all medications and/or complete list.  Confirmed patient has transportation. Gave directions, instructed to utilize valet parking.

## 2024-07-05 ENCOUNTER — Encounter (HOSPITAL_COMMUNITY): Payer: Self-pay

## 2024-07-05 ENCOUNTER — Ambulatory Visit (HOSPITAL_COMMUNITY)
Admit: 2024-07-05 | Discharge: 2024-07-05 | Disposition: A | Discharge status: Home / Self Care | Source: Ambulatory Visit | Attending: Cardiology | Admitting: Cardiology

## 2024-07-05 ENCOUNTER — Other Ambulatory Visit: Payer: Self-pay

## 2024-07-05 ENCOUNTER — Telehealth: Payer: Self-pay | Admitting: Internal Medicine

## 2024-07-05 VITALS — BP 150/86 | HR 84 | Ht 68.0 in | Wt 195.0 lb

## 2024-07-05 DIAGNOSIS — Z7984 Long term (current) use of oral hypoglycemic drugs: Secondary | ICD-10-CM | POA: Insufficient documentation

## 2024-07-05 DIAGNOSIS — Z79899 Other long term (current) drug therapy: Secondary | ICD-10-CM | POA: Insufficient documentation

## 2024-07-05 DIAGNOSIS — Z8674 Personal history of sudden cardiac arrest: Secondary | ICD-10-CM | POA: Diagnosis not present

## 2024-07-05 DIAGNOSIS — I255 Ischemic cardiomyopathy: Secondary | ICD-10-CM | POA: Diagnosis present

## 2024-07-05 DIAGNOSIS — I214 Non-ST elevation (NSTEMI) myocardial infarction: Secondary | ICD-10-CM | POA: Diagnosis not present

## 2024-07-05 DIAGNOSIS — I10 Essential (primary) hypertension: Secondary | ICD-10-CM | POA: Diagnosis not present

## 2024-07-05 DIAGNOSIS — I252 Old myocardial infarction: Secondary | ICD-10-CM | POA: Diagnosis not present

## 2024-07-05 DIAGNOSIS — I2510 Atherosclerotic heart disease of native coronary artery without angina pectoris: Secondary | ICD-10-CM | POA: Diagnosis present

## 2024-07-05 DIAGNOSIS — Z7902 Long term (current) use of antithrombotics/antiplatelets: Secondary | ICD-10-CM | POA: Diagnosis not present

## 2024-07-05 DIAGNOSIS — Z87891 Personal history of nicotine dependence: Secondary | ICD-10-CM | POA: Insufficient documentation

## 2024-07-05 DIAGNOSIS — I5022 Chronic systolic (congestive) heart failure: Secondary | ICD-10-CM | POA: Insufficient documentation

## 2024-07-05 DIAGNOSIS — E119 Type 2 diabetes mellitus without complications: Secondary | ICD-10-CM | POA: Insufficient documentation

## 2024-07-05 DIAGNOSIS — I110 Hypertensive heart disease with heart failure: Secondary | ICD-10-CM | POA: Insufficient documentation

## 2024-07-05 DIAGNOSIS — J449 Chronic obstructive pulmonary disease, unspecified: Secondary | ICD-10-CM | POA: Diagnosis not present

## 2024-07-05 DIAGNOSIS — Z7982 Long term (current) use of aspirin: Secondary | ICD-10-CM | POA: Insufficient documentation

## 2024-07-05 NOTE — Patient Instructions (Signed)
 TAKE 1/2 a tablet of Entresto  Twice daily  Blood work in 10 days.  Your physician recommends that you schedule a follow-up appointment in: 2 weeks.  If you have any questions or concerns before your next appointment please send us  a message through [facility] or call our office at [phone].    TO LEAVE A MESSAGE FOR THE NURSE SELECT OPTION 2, PLEASE LEAVE A MESSAGE INCLUDING: YOUR NAME DATE OF BIRTH CALL BACK NUMBER REASON FOR CALL**this is important as we prioritize the call backs  YOU WILL RECEIVE A CALL BACK THE SAME DAY AS LONG AS YOU CALL BEFORE 4:00 PM  At the [facility], you and your health needs are our priority. As part of our continuing mission to provide you with exceptional heart care, we have created designated Provider Care Teams. These Care Teams include your primary Cardiologist (physician) and Advanced Practice Providers (APPs- Physician Assistants and Nurse Practitioners) who all work together to provide you with the care you need, when you need it.   You may see any of the following providers on your designated Care Team at your next follow up: Dr Toribio Fuel Dr Ezra Shuck Dr. Morene Brownie Greig Mosses, NP Caffie Shed, [place] [facility] [place], [place] Beckey Coe, NP Jordan Lee, NP Ellouise Class, NP Tinnie Redman, PharmD Jaun Bash, PharmD   Please be sure to bring in all your medications bottles to every appointment.    Thank you for choosing [facility]

## 2024-07-05 NOTE — Telephone Encounter (Signed)
 Contacted patient. Patient scheduled for 12/4 at 2:10 pm.

## 2024-07-05 NOTE — Progress Notes (Signed)
 Advanced [facility] Failure Clinic Note   PCP: Vicci Barnie NOVAK, MD Primary Cardiologist: Kardie Tobb, DO  HF Cardiologist: Dr. Cherrie  HPI: Mr Baskette is a 68 y.o. 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    Brought to ER 8/20 with respiratory failure/cardiac arrest. ECG with anterior ST elevation. Cath with 3v CAD and totally occluded prox LAD. LCX anomalous off RCA. Underwent PCI of LAD which was diffusely diseased vessel. Developed cardiogenic shock requiring pressors and mechanical support with impella.  8/20 Echo 20-25%. Hospital course complicated by Torsades. EP consulted placed ICD prior to discharge.    He stopped Brillinta in 4/22. Admitted in 6/22 with NSTEMI. Cath with patent proximal LAD stent with mild 20% in-stent narrowing.  There is significant thrombus burden immediately after the stent extending into the diagonal vessel and mid LAD with TIMI-3 flow. Treated with Aggrastat  x 48 hours. ECHO 6/22 EF 35-40% Relook angiography of the left coronary system showed improvement in the previous thrombus burden in the diagonal and LAD distal stent and beyond but moderate residual thrombus was still present. Underwent successful percutaneous coronary intervention of the LAD utilizing Pronto thrombectomy, intravascular ultrasound and ultimate PTCA of the distal stented segment and mid LAD with residual narrowing 0%. Recommended indefinite DAPT.   Readmitted 06/25/24 with NSTEMI. Cath showed patent stents and moderate nonobstructive disease. Normal filling pressures and marginally low CI. Echo with EF 30-35%, trabeculae concerning for LV thrombus, nl RV.  Was seen for cardiology f/u 07/01/24 doing well, repeat echo negative for thrombus.   He returns today for heart failure follow up. Overall feeling well. NYHA II. Reports fatigue. Denies chest pain, dyspnea, palpitations, and dizziness. Able to perform ADLs. Appetite okay. BP at follow up appointments has been elevated.  Compliant with all medications.  Cardiac Studies: - Echo 11/25: EF 30-35% - Echo 6/25 EF 40-45% - Echo 12/22 EF 35-40%. - Echo 4/22 EF 55-60%   - Echo 2/21  EF 45-50%   Past Medical History:  Diagnosis Date   Cardiogenic shock (HCC)    CHF (congestive heart failure) (HCC)    Coronary artery disease    Diabetes mellitus without complication (HCC)    Hyperlipidemia    Medical history non-contributory    Current Outpatient Medications  Medication Sig Dispense Refill   albuterol  (PROVENTIL ) (2.5 MG/3ML) 0.083% nebulizer solution Take 3 mLs (2.5 mg total) by nebulization every 6 (six) hours as needed for wheezing or shortness of breath. 75 mL 12   albuterol  (VENTOLIN  HFA) 108 (90 Base) MCG/ACT inhaler INHALE 2 PUFFS BY MOUTH EVERY 6 HOURS AS NEEDED FOR WHEEZING FOR SHORTNESS OF BREATH 18 g 0   aspirin  EC 81 MG tablet Take 1 tablet (81 mg total) by mouth daily. Swallow whole. 30 tablet 12   budesonide-glycopyrrolate-formoterol  (BREZTRI  AEROSPHERE) 160-9-4.8 MCG/ACT AERO inhaler Inhale 2 puffs into the lungs 2 (two) times daily. 3 each 4   Evolocumab  (REPATHA  SURECLICK) 140 MG/ML SOAJ Inject 140 mg into the skin every 14 (fourteen) days. 6 mL 3   fluticasone  (FLONASE ) 50 MCG/ACT nasal spray USE 1 SPRAY IN EACH NOSTRIL ONCE DAILY AS NEEDED FOR ALLERGIES OR RHINITIS 48 g 0   JARDIANCE  10 MG TABS tablet Take 1 tablet (10 mg total) by mouth daily. Must have office visit for refills 30 tablet 0   loratadine  (CLARITIN ) 10 MG tablet Take 1 tablet by mouth once daily 90 tablet 3   metoprolol  succinate (TOPROL -XL) 25 MG 24 hr tablet  Take 0.5 tablets (12.5 mg total) by mouth daily. 30 tablet 1   montelukast  (SINGULAIR ) 10 MG tablet TAKE ONE TABLET BY MOUTH DAILY AT BEDTIME (Patient not taking: Reported on 07/04/2024) 30 tablet 0   nitroGLYCERIN  (NITROSTAT ) 0.4 MG SL tablet Place 1 tablet (0.4 mg total) under the tongue every 5 (five) minutes x 3 doses as needed for chest pain. 25 tablet 3   omeprazole   (PRILOSEC) 20 MG capsule Take 1 capsule (20 mg total) by mouth daily. (Patient not taking: Reported on 07/04/2024) 90 capsule 1   pravastatin  (PRAVACHOL ) 80 MG tablet Take 1 tablet (80 mg total) by mouth daily. 90 tablet 1   ticagrelor  (BRILINTA ) 90 MG TABS tablet Take 1 tablet (90 mg total) by mouth 2 (two) times daily. 60 tablet 11   traZODone  (DESYREL ) 50 MG tablet TAKE HALF TABLET BY MOUTH AT BEDTIME 30 tablet 1   No current facility-administered medications for this visit.   Allergies  Allergen Reactions   Aldactone  [Spironolactone ] Other (See Comments)    Gynecomastia    Lipitor  [Atorvastatin ] Other (See Comments)    Myalgias Fatigue   Glucophage  [Metformin ] Anxiety and Palpitations   Social History   Socioeconomic History   Marital status: Married    Spouse name: Not on file   Number of children: 1   Years of education: 8   Highest education level: 8th grade  Occupational History   Not on file  Tobacco Use   Smoking status: Former    Current packs/day: 0.00    Types: Cigarettes    Quit date: 04/08/2019    Years since quitting: 5.2    Passive exposure: Current (wife smokes)   Smokeless tobacco: Never  Vaping Use   Vaping status: Never Used  Substance and Sexual Activity   Alcohol  use: No   Drug use: No   Sexual activity: Not Currently  Other Topics Concern   Not on file  Social History Narrative   Lives with wife   Right handed   Caffeine: about 2 cups of coffee every morning, no soda   Social Drivers of Corporate Investment Banker Strain: Low Risk  (09/08/2023)   Overall Financial Resource Strain (CARDIA)    Difficulty of Paying Living Expenses: Not very hard  Food Insecurity: No Food Insecurity (06/28/2024)   Hunger Vital Sign    Worried About Running Out of Food in the Last Year: Never true    Ran Out of Food in the Last Year: Never true  Transportation Needs: No Transportation Needs (06/28/2024)   PRAPARE - Administrator, Civil Service  (Medical): No    Lack of Transportation (Non-Medical): No  Physical Activity: Inactive (09/08/2023)   Exercise Vital Sign    Days of Exercise per Week: 0 days    Minutes of Exercise per Session: 0 min  Stress: No Stress Concern Present (09/08/2023)   [employer] of Occupational Health - Occupational Stress Questionnaire    Feeling of Stress : Not at all  Social Connections: Moderately Isolated (06/23/2024)   Social Connection and Isolation Panel    Frequency of Communication with Friends and Family: More than three times a week    Frequency of Social Gatherings with Friends and Family: Twice a week    Attends Religious Services: Never    Database Administrator or Organizations: No    Attends Banker Meetings: Never    Marital Status: Married  Catering Manager Violence: Not At Risk (06/28/2024)  Humiliation, Afraid, Rape, and Kick questionnaire    Fear of Current or Ex-Partner: No    Emotionally Abused: No    Physically Abused: No    Sexually Abused: No   Family History  Problem Relation Age of Onset   Hypertension Mother    Heart failure Mother    Headache Neg Hx    Migraines Neg Hx    There were no vitals taken for this visit.  Wt Readings from Last 3 Encounters:  07/04/24 85.7 kg (189 lb)  07/01/24 86.6 kg (191 lb)  06/28/24 83.9 kg (185 lb)   PHYSICAL EXAM: General: Elderly appearing. No distress  Cardiac: JVP flat. No murmurs  Abdomen: Soft, non-distended.  Extremities: Warm and dry.  No edema.  Neuro: A&O x3. Affect pleasant.   ECG (personally reviewed): NSR 79 bpm  Device Interrogation (personally reviewed): 0 VT/VF. 0 AF, CorVue significantly above reference during admission, appeared very dry, back to baseline  ASSESSMENT & PLAN: 1. Chronic systolic HF - Echo 8/20 EF 20-25% - Echo 09/23/19: EF 45-50% - Echo 4/22: EF 55-60%  - Echo 12/22 EF 35-40% .  - Echo 6/25 EF 40-45% - Echo 11/25: EF 30-25% - NYHA I. Volume status ok  - Restart  Entresto  to 49/51 mg bid, hope to get back to full dose at follow up - Continue Toprol  XL 12.5 mg daily.  - Continue Jardiance  10 mg daily. - Hold on restarting epleronone with K - Recent bloodwork 07/01/24, K 4.9 SCr 1.1  2. 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 - No VT on interrogation - has f/u with device RN 10/17   3. CAD  - s/p acute anterior MI 8/20 with PCI/DES to LAD. Diffusely diseased vessel + residual CAD in RCA and anomalous LCX. - NSTEMI in 6/22 after stopping Brillinta. Stent patent but clot after stent -> s/p thrombectomy and POBA - NSTEMI 11/25: patent LAD stenti, nonobstructive disease in Lcx/RCA. - Continue DAPT and statin indefinitely. On Rapatha - No chest pain.  - Last LDL 38   4. DM2 - On metformin  & Jardiance .  - Per PCP   5. COPD - Stable. Continue inhalers - No longer smoking  6. HTN - add back GDMT   Follow up in 2 weeks with APP (add back GDMT)  Elmyra Banwart, NP 07/05/24

## 2024-07-05 NOTE — Telephone Encounter (Signed)
 Copied from CRM #[id number]. Topic: Appointments - Appointment Info/Confirmation >> Jul 04, 2024  2:45 PM Montie POUR wrote: Patient/patient representative is calling for information regarding an appointment.  Please call Hillel at [phone] to let him know if he could get a sooner appointment than 08/05/24. He discharge from hospital on 06/26/24 and this is suppose to be a hospital follow. He also need refills on medications.

## 2024-07-07 ENCOUNTER — Other Ambulatory Visit: Payer: Self-pay | Admitting: Internal Medicine

## 2024-07-07 DIAGNOSIS — K219 Gastro-esophageal reflux disease without esophagitis: Secondary | ICD-10-CM

## 2024-07-07 DIAGNOSIS — J439 Emphysema, unspecified: Secondary | ICD-10-CM

## 2024-07-07 MED ORDER — OMEPRAZOLE 20 MG PO CPDR: 20.0000 mg | DELAYED_RELEASE_CAPSULE | Freq: Every day | ORAL | 0 refills | Status: DC

## 2024-07-07 MED ORDER — MONTELUKAST SODIUM 10 MG PO TABS: 10.0000 mg | ORAL_TABLET | Freq: Every day | ORAL | 0 refills | Status: DC

## 2024-07-08 ENCOUNTER — Other Ambulatory Visit: Payer: Self-pay | Admitting: Internal Medicine

## 2024-07-08 ENCOUNTER — Ambulatory Visit: Attending: Cardiology

## 2024-07-08 ENCOUNTER — Telehealth: Payer: Self-pay

## 2024-07-08 DIAGNOSIS — Z95810 Presence of automatic (implantable) cardiac defibrillator: Secondary | ICD-10-CM

## 2024-07-08 DIAGNOSIS — G4700 Insomnia, unspecified: Secondary | ICD-10-CM

## 2024-07-08 DIAGNOSIS — I5022 Chronic systolic (congestive) heart failure: Secondary | ICD-10-CM | POA: Diagnosis not present

## 2024-07-08 DIAGNOSIS — Z6379 Other stressful life events affecting family and household: Secondary | ICD-10-CM

## 2024-07-08 NOTE — Telephone Encounter (Signed)
 Remote ICM transmission received.  Attempted call to patient regarding ICM remote transmission and no answer.

## 2024-07-08 NOTE — Progress Notes (Signed)
 EPIC Encounter for ICM Monitoring  Patient Name: Stephen Jennings is a 68 y.o. male Date: 07/08/2024 Primary Care Physican: Vicci Barnie NOVAK, MD Primary Cardiologist: [place]/Bensimhon Electrophysiologist: [facility] 08/24/2022 Weight: 194 lbs (baseline 194-195) 12/26/2023 Office Weight: 198 lbs   04/23/2024 Weight: 195 lbs      05/02/2024 Office Visit: 196 lbs           07/05/2024 Office Weight: 195 lbs                             Attempted call to patient and unable to reach.  Transmission results reviewed.    Diet:  N/A   Since 06/10/2024 ICM Remote Transmission: CorVue thoracic impedance suggesting possible fluid accumulation 06/17/2024-06/23/2024 and possible dryness from 06/24/2024-07/02/2024.  Returned to normal 07/03/2024.   Prescribed:  No diuretic   Labs: 07/15/2024 BMET scheduled at HF clinic 07/01/2024 Creatinine 1.10, BUN 18, Potassium 4.9, Sodium 140, GFR 73  06/26/2024 Creatinine 1.57, BUN 31, Potassium 3.5, Sodium 138  A complete set of results can be found in Results Review.   Recommendations:  Unable to reach.     Follow-up plan: ICM clinic phone appointment on 08/19/2024.   91 day device clinic remote transmission 07/17/2024.     EP/Cardiology Office Visits: 07/30/2024 with HF clinic 08/19/2024.   Recall 05/02/2025 with Dr Cherrie.   Recall 10/07/2024 with Jodie Passey, PA.   Copy of ICM check sent to Dr. Inocencio.   Remote monitoring is medically necessary for Heart Failure Management.    Daily Thoracic Impedance ICM trend: 04/09/2024 through 07/08/2024.    12-14 Month Thoracic Impedance ICM trend:     Stephen GORMAN Garner, RN 07/08/2024 10:41 AM

## 2024-07-11 ENCOUNTER — Telehealth: Payer: Self-pay | Admitting: *Deleted

## 2024-07-11 ENCOUNTER — Encounter: Payer: Self-pay | Admitting: *Deleted

## 2024-07-12 ENCOUNTER — Other Ambulatory Visit: Payer: Self-pay | Admitting: *Deleted

## 2024-07-12 NOTE — Patient Instructions (Signed)
 Visit Information  Thank you for taking time to visit with me today. Please don't hesitate to contact me if I can be of assistance to you before our next scheduled telephone appointment.   Following is a copy of your care plan:   Goals Addressed             This Visit's Progress    VBCI Transitions of Care (TOC) Care Plan       Problems:  Recent Hospitalization for treatment of CHF Knowledge Deficit Related to CHF  Goal:  Over the next 30 days, the patient will not experience hospital readmission  Interventions:  Transitions of Care: Durable Medical Equipment (DME) reviewed with patient/caregiver Doctor Visits  - discussed the importance of doctor visits Post discharge activity limitations prescribed by provider reviewed Medication Review, discussed restarting Entresto  Secure communication to Dr. Vicci regarding patient needing Omeprazole  and Montelukast  refills-verified patient received medications Advised patient to take medications to upcoming appointments Reviewed upcoming appointments including:07/15/24 for Lab, 07/16/24 telephone call for AWV and 07/25/24 with PCP for Hospital follow up Discussed daily weights and BP-reviewed readings Reviewed BS reading  Heart Failure Interventions: Discussed importance of daily weight and advised patient to weigh and record daily Discussed the importance of keeping all appointments with provider Reviewed provider note form HF visit and discussed   Patient Self Care Activities:  Attend all scheduled provider appointments Call pharmacy for medication refills 3-7 days in advance of running out of medications Call provider office for new concerns or questions  Notify RN Care Manager of TOC call rescheduling needs Participate in Transition of Care Program/Attend TOC scheduled calls Take medications as prescribed   use salt in moderation weigh myself daily bring diary to all appointments know when to call the doctor:weight gain of  2-3 # overnight or 5 # in a week dress right for the weather, hot or cold  Plan:  Telephone follow up appointment with care management team member scheduled for:  07/24/24 at 1pm        The patient verbalized understanding of instructions, educational materials, and care plan provided today and agreed to receive a mailed copy of patient instructions, educational materials, and care plan.   Telephone follow up appointment with care management team member scheduled for:07/24/24 at 1pm  Please call the care guide team at [phone] if you need to cancel or reschedule your appointment.   Please call [phone]-TALK (toll free, 24 hour hotline) go to [facility] Urgent [facility] [address] [place] [phone]) call 911 if you are experiencing a Mental Health or Behavioral Health Crisis or need someone to talk to.  Andrea Dimes RN, BSN [place]  [facility] RN Care Manager [phone]

## 2024-07-12 NOTE — Transitions of Care (Post Inpatient/ED Visit) (Signed)
 Transition of Care week 3  Visit Note  07/12/2024  Name: Stephen Jennings MRN: [id number]          DOB: Jun 11, 1956  Situation: Patient enrolled in [facility] 30-day program. Visit completed with Stephen Jennings by telephone.   Background:   Initial Transition Care Management Follow-up Telephone Call Discharge Date and Diagnosis: 06/26/24, Acute on chronic systolic (congestive) heart failure   Past Medical History:  Diagnosis Date   Cardiogenic shock (HCC)    CHF (congestive heart failure) (HCC)    Coronary artery disease    Diabetes mellitus without complication (HCC)    Hyperlipidemia    Medical history non-contributory     Assessment: Patient Reported Symptoms: Cognitive Cognitive Status: Able to follow simple commands, Alert and oriented to person, place, and time, Normal speech and language skills      Neurological Neurological Review of Symptoms: No symptoms reported    HEENT HEENT Symptoms Reported: Other: (nasal congestion) HEENT Management Strategies: Routine screening, Coping strategies, Adequate rest HEENT Self-Management Outcome: 3 (uncertain) HEENT Comment: Continues to have nasal congestion, denies fever, taking allergy medication and using flonase     Cardiovascular Cardiovascular Symptoms Reported: No symptoms reported Does patient have uncontrolled Hypertension?: Yes Is patient checking Blood Pressure at home?: Yes Patient's Recent BP reading at home: 130/77 Cardiovascular Management Strategies: Routine screening, Medication therapy, Coping strategies, Adequate rest Weight: 184 lb (83.5 kg) Cardiovascular Self-Management Outcome: 4 (good) Cardiovascular Comment: HF visit reviewed discussed restarting Entresto . Has Entresto  and taking as directed.  Respiratory Respiratory Symptoms Reported: No symptoms reported Respiratory Management Strategies: Routine screening, Coping strategies, Medication therapy, Adequate rest Respiratory Self-Management Outcome: 4 (good)   Endocrine Endocrine Symptoms Reported: No symptoms reported Is patient diabetic?: Yes Is patient checking blood sugars at home?: Yes List most recent blood sugar readings, include date and time of day: 122 this morning Endocrine Self-Management Outcome: 4 (good)  Gastrointestinal Gastrointestinal Symptoms Reported: Not assessed      Genitourinary Genitourinary Symptoms Reported: Not assessed    Integumentary Integumentary Symptoms Reported: Not assessed    Musculoskeletal Musculoskelatal Symptoms Reviewed: No symptoms reported        Psychosocial Psychosocial Symptoms Reported: Not assessed         Today's Vitals   07/12/24 1043  BP: 130/77  Weight: 184 lb (83.5 kg)   Pain Score: 0-No pain  Medications Reviewed Today     Reviewed by Lucky Andrea LABOR, RN (Registered Nurse) on 07/12/24 at 1037  Med List Status: <None>   Medication Order Taking? Sig Documenting Provider Last Dose Status Informant  albuterol  (PROVENTIL ) (2.5 MG/3ML) 0.083% nebulizer solution [id number] Yes Take 3 mLs (2.5 mg total) by nebulization every 6 (six) hours as needed for wheezing or shortness of breath. Doretha Folks, MD  Active Self, Pharmacy Records  albuterol  (VENTOLIN  HFA) 108 [phone] Base) MCG/ACT inhaler [id number] Yes INHALE 2 PUFFS BY MOUTH EVERY 6 HOURS AS NEEDED FOR WHEEZING FOR SHORTNESS OF BREATH Hunsucker, Donnice SAUNDERS, MD  Active Self, Pharmacy Records  aspirin  EC 81 MG tablet [id number] Yes Take 1 tablet (81 mg total) by mouth daily. Swallow whole. Krishnan, Gokul, MD  Active   budesonide -glycopyrrolate -formoterol  (BREZTRI  AEROSPHERE) 160-9-4.8 MCG/ACT AERO inhaler [id number] Yes Inhale 2 puffs into the lungs 2 (two) times daily. Hunsucker, Donnice SAUNDERS, MD  Active Self, Pharmacy Records  Evolocumab  (REPATHA  SURECLICK) 140 MG/ML EMMANUEL [id number] Yes Inject 140 mg into the skin every 14 (fourteen) days. Inocencio Soyla Lunger, MD  Active Self, Pharmacy Records  fluticasone  (FLONASE ) 50 MCG/ACT  nasal spray  [id number] Yes USE 1 SPRAY IN EACH NOSTRIL ONCE DAILY AS NEEDED FOR ALLERGIES OR RHINITIS Vicci Barnie NOVAK, MD  Active Self, Pharmacy Records  JARDIANCE  10 MG TABS tablet [id number] Yes Take 1 tablet (10 mg total) by mouth daily. Must have office visit for refills Vicci Barnie NOVAK, MD  Active Self, Pharmacy Records  loratadine  (CLARITIN ) 10 MG tablet [id number] Yes Take 1 tablet by mouth once daily Hunsucker, Donnice SAUNDERS, MD  Active Self, Pharmacy Records  metoprolol  succinate (TOPROL -XL) 25 MG 24 hr tablet [id number] Yes Take 0.5 tablets (12.5 mg total) by mouth daily. Krishnan, Gokul, MD  Active   montelukast  (SINGULAIR ) 10 MG tablet [id number] Yes Take 1 tablet (10 mg total) by mouth at bedtime. Vicci Barnie NOVAK, MD  Active   nitroGLYCERIN  (NITROSTAT ) 0.4 MG SL tablet [id number] Yes Place 1 tablet (0.4 mg total) under the tongue every 5 (five) minutes x 3 doses as needed for chest pain. Madie Jon Garre, PA  Active Self, Pharmacy Records           Med Note (COFFELL, JON HERO   Mon Jun 24, 2024  2:47 PM) Has available but never used from home supply.  omeprazole  (PRILOSEC) 20 MG capsule [id number] Yes Take 1 capsule (20 mg total) by mouth daily. Vicci Barnie NOVAK, MD  Active   pravastatin  (PRAVACHOL ) 80 MG tablet [id number] Yes Take 1 tablet (80 mg total) by mouth daily. Darryle Thom CROME, [name]  Active   sacubitril -valsartan  (ENTRESTO ) 97-103 MG [id number] Yes Take 0.5 tablets by mouth 2 (two) times daily. [provider]  Active   ticagrelor  (BRILINTA ) 90 MG TABS tablet [id number] Yes Take 1 tablet (90 mg total) by mouth 2 (two) times daily. Bensimhon, Toribio SAUNDERS, MD  Active Self, Pharmacy Records  traZODone  (DESYREL ) 50 MG tablet [id number] Yes TAKE HALF TABLET BY MOUTH AT BEDTIME Vicci Barnie NOVAK, MD  Active             Recommendation:   Continue Current Plan of Care  Follow Up Plan:   Telephone follow-up in 1 week  Andrea Dimes RN, BSN [place]  Value-Based Care  [facility] RN Care Manager [phone]

## 2024-07-15 ENCOUNTER — Ambulatory Visit (HOSPITAL_COMMUNITY)
Admission: RE | Admit: 2024-07-15 | Discharge: 2024-07-15 | Disposition: A | Discharge status: Home / Self Care | Source: Ambulatory Visit | Attending: Cardiology | Admitting: Cardiology

## 2024-07-15 DIAGNOSIS — I5022 Chronic systolic (congestive) heart failure: Secondary | ICD-10-CM | POA: Diagnosis present

## 2024-07-15 LAB — BASIC METABOLIC PANEL WITH GFR
Anion gap: 12 (ref 5–15)
BUN: 24 mg/dL — ABNORMAL HIGH (ref 8–23)
CO2: 24 mmol/L (ref 22–32)
Calcium: 9 mg/dL (ref 8.9–10.3)
Chloride: 103 mmol/L (ref 98–111)
Creatinine, Ser: 1.24 mg/dL (ref 0.61–1.24)
GFR, Estimated: >60 mL/min (ref >60)
Glucose, Bld: 122 mg/dL — ABNORMAL HIGH (ref 70–99)
Potassium: 3.9 mmol/L (ref 3.5–5.1)
Sodium: 139 mmol/L (ref 135–145)

## 2024-07-16 ENCOUNTER — Ambulatory Visit: Payer: Medicare HMO | Attending: Internal Medicine

## 2024-07-16 ENCOUNTER — Ambulatory Visit (HOSPITAL_COMMUNITY): Payer: Self-pay | Admitting: Cardiology

## 2024-07-16 VITALS — Ht 68.0 in | Wt 185.0 lb

## 2024-07-16 DIAGNOSIS — Z0000 Encounter for general adult medical examination without abnormal findings: Secondary | ICD-10-CM | POA: Diagnosis not present

## 2024-07-16 LAB — POCT I-STAT EG7
Acid-base deficit: 7 mmol/L — ABNORMAL HIGH (ref 0.0–2.0)
Bicarbonate: 19.1 mmol/L — ABNORMAL LOW (ref 20.0–28.0)
Calcium, Ion: 0.76 mmol/L — CL (ref 1.15–1.40)
HCT: 43 % (ref 39.0–52.0)
Hemoglobin: 14.6 g/dL (ref 13.0–17.0)
O2 Saturation: 58 %
Potassium: 2.4 mmol/L — CL (ref 3.5–5.1)
Sodium: 121 mmol/L — ABNORMAL LOW (ref 135–145)
TCO2: 20 mmol/L — ABNORMAL LOW (ref 22–32)
pCO2, Ven: 41.4 mmHg — ABNORMAL LOW (ref 44–60)
pH, Ven: 7.272 (ref 7.25–7.43)
pO2, Ven: 34 mmHg (ref 32–45)

## 2024-07-16 NOTE — Patient Instructions (Signed)
 Stephen Jennings,  Thank you for taking the time for your Medicare Wellness Visit. I appreciate your continued commitment to your health goals. Please review the care plan we discussed, and feel free to reach out if I can assist you further.  Please note that Annual Wellness Visits do not include a physical exam. Some assessments may be limited, especially if the visit was conducted virtually. If needed, we may recommend an in-person follow-up with your provider.  Ongoing Care Seeing your primary care provider every 3 to 6 months helps us  monitor your health and provide consistent, personalized care.   Referrals If a referral was made during today's visit and you haven't received any updates within two weeks, please contact the referred provider directly to check on the status.  Recommended Screenings:  Health Maintenance  Topic Date Due   Stool Blood Test  Never done   Colon Cancer Screening  Never done   Zoster (Shingles) Vaccine (1 of 2) Never done   Eye exam for diabetics  11/07/2023   Flu Shot  03/22/2024   COVID-19 Vaccine (3 - 2025-26 season) 04/22/2024   Medicare Annual Wellness Visit  07/10/2024   Yearly kidney health urinalysis for diabetes  09/07/2024   Complete foot exam   09/07/2024   Hemoglobin A1C  12/21/2024   Yearly kidney function blood test for diabetes  07/15/2025   Pneumococcal Vaccine for age over 15  Completed   Hepatitis C Screening  Completed   Meningitis B Vaccine  Aged Out   DTaP/Tdap/Td vaccine  Discontinued       07/16/2024    8:40 AM  Advanced Directives  Does Patient Have a Medical Advance Directive? No  Would patient like information on creating a medical advance directive? No - Patient declined    Vision: Annual vision screenings are recommended for early detection of glaucoma, cataracts, and diabetic retinopathy. These exams can also reveal signs of chronic conditions such as diabetes and high blood pressure.  Dental: Annual dental screenings  help detect early signs of oral cancer, gum disease, and other conditions linked to overall health, including heart disease and diabetes.  Please see the attached documents for additional preventive care recommendations.

## 2024-07-16 NOTE — Progress Notes (Signed)
 I connected with  Stephen Jennings on 07/16/24 by a audio enabled telemedicine application and verified that I am speaking with the correct person using two identifiers.  Patient Location: Home  Provider Location: Office/Clinic  Persons Participating in Visit: Patient.  I discussed the limitations of evaluation and management by telemedicine. The patient expressed understanding and agreed to proceed.   Vital Signs: Because this visit was a virtual/telehealth visit, some criteria may be missing or patient reported. Any vitals not documented were not able to be obtained and vitals that have been documented are patient reported.     Chief Complaint  Patient presents with   Medicare Wellness    SUBSEQUENT     Subjective:   Stephen Jennings is a 68 y.o. male who presents for a Medicare Annual Wellness Visit.  Allergies (verified) Aldactone  [spironolactone ], Lipitor  [atorvastatin ], and Glucophage  [metformin ]   History: Past Medical History:  Diagnosis Date   Cardiogenic shock (HCC)    CHF (congestive heart failure) (HCC)    Coronary artery disease    Diabetes mellitus without complication (HCC)    Hyperlipidemia    Medical history non-contributory    Past Surgical History:  Procedure Laterality Date   CARDIAC CATHETERIZATION     CORONARY BALLOON ANGIOPLASTY N/A 02/04/2021   Procedure: CORONARY BALLOON ANGIOPLASTY;  Surgeon: Burnard Debby LABOR, MD;  Location: [facility] INVASIVE CV LAB;  Service: Cardiovascular;  Laterality: N/A;   CORONARY THROMBECTOMY N/A 02/04/2021   Procedure: Coronary Thrombectomy;  Surgeon: Burnard Debby LABOR, MD;  Location: [facility] INVASIVE CV LAB;  Service: Cardiovascular;  Laterality: N/A;   CORONARY ULTRASOUND/IVUS N/A 02/04/2021   Procedure: Intravascular Ultrasound/IVUS;  Surgeon: Burnard Debby LABOR, MD;  Location: [facility] INVASIVE CV LAB;  Service: Cardiovascular;  Laterality: N/A;   CORONARY/GRAFT ACUTE MI REVASCULARIZATION N/A 03/31/2019   Procedure: Coronary/Graft Acute MI  Revascularization;  Surgeon: Burnard Debby LABOR, MD;  Location: [facility] INVASIVE CV LAB;  Service: Cardiovascular;  Laterality: N/A;   ICD IMPLANT N/A 04/15/2019   Procedure: ICD IMPLANT;  Surgeon: Inocencio Soyla Lunger, MD;  Location[facility] CV LAB;  Service: Cardiovascular;  Laterality: N/A;   LEFT HEART CATH AND CORONARY ANGIOGRAPHY N/A 03/31/2019   Procedure: LEFT HEART CATH AND CORONARY ANGIOGRAPHY;  Surgeon: Burnard Debby LABOR, MD;  Location: [facility] INVASIVE CV LAB;  Service: Cardiovascular;  Laterality: N/A;   LEFT HEART CATH AND CORONARY ANGIOGRAPHY N/A 02/02/2021   Procedure: LEFT HEART CATH AND CORONARY ANGIOGRAPHY;  Surgeon: Burnard Debby LABOR, MD;  Location: [facility] INVASIVE CV LAB;  Service: Cardiovascular;  Laterality: N/A;   LEFT HEART CATH AND CORONARY ANGIOGRAPHY N/A 02/04/2021   Procedure: LEFT HEART CATH AND CORONARY ANGIOGRAPHY;  Surgeon: Burnard Debby LABOR, MD;  Location: [facility] INVASIVE CV LAB;  Service: Cardiovascular;  Laterality: N/A;   NO PAST SURGERIES     RIGHT HEART CATH N/A 03/31/2019   Procedure: RIGHT HEART CATH;  Surgeon: Burnard Debby LABOR, MD;  Location: [facility] INVASIVE CV LAB;  Service: Cardiovascular;  Laterality: N/A;   RIGHT/LEFT HEART CATH AND CORONARY ANGIOGRAPHY N/A 06/25/2024   Procedure: RIGHT/LEFT HEART CATH AND CORONARY ANGIOGRAPHY;  Surgeon: Elmira Newman PARAS, MD;  Location: [facility] INVASIVE CV LAB;  Service: Cardiovascular;  Laterality: N/A;   VENTRICULAR ASSIST DEVICE INSERTION N/A 03/31/2019   Procedure: VENTRICULAR ASSIST DEVICE INSERTION;  Surgeon: Burnard Debby LABOR, MD;  Location: [facility] INVASIVE CV LAB;  Service: Cardiovascular;  Laterality: N/A;   Family History  Problem Relation Age of Onset   Hypertension Mother    Heart  failure Mother    Headache Neg Hx    Migraines Neg Hx    Social History   Occupational History   Not on file  Tobacco Use   Smoking status: Former    Current packs/day: 0.00    Types: Cigarettes    Quit date: 04/08/2019    Years since quitting: 5.2    Passive  exposure: Current (wife smokes)   Smokeless tobacco: Never  Vaping Use   Vaping status: Never Used  Substance and Sexual Activity   Alcohol  use: No   Drug use: No   Sexual activity: Not Currently   Tobacco Counseling Counseling given: Not Answered  SDOH Screenings   Food Insecurity: No Food Insecurity (07/16/2024)  Housing: Low Risk  (07/16/2024)  Transportation Needs: No Transportation Needs (07/16/2024)  Utilities: Not At Risk (07/16/2024)  Alcohol  Screen: Low Risk  (07/16/2024)  Depression (PHQ2-9): Low Risk  (07/16/2024)  Financial Resource Strain: Low Risk  (07/16/2024)  Physical Activity: Inactive (07/16/2024)  Social Connections: Moderately Isolated (07/16/2024)  Stress: No Stress Concern Present (07/16/2024)  Tobacco Use: Medium Risk (07/16/2024)  Health Literacy: Adequate Health Literacy (07/16/2024)   See flowsheets for full screening details  Depression Screen PHQ 2 & 9 Depression Scale- Over the past 2 weeks, how often have you been bothered by any of the following problems? Little interest or pleasure in doing things: 0 Feeling down, depressed, or hopeless (PHQ Adolescent also includes...irritable): 0 PHQ-2 Total Score: 0 Trouble falling or staying asleep, or sleeping too much: 0 Feeling tired or having little energy: 0 Poor appetite or overeating (PHQ Adolescent also includes...weight loss): 0 Feeling bad about yourself - or that you are a failure or have let yourself or your family down: 0 Trouble concentrating on things, such as reading the newspaper or watching television (PHQ Adolescent also includes...like school work): 0 Moving or speaking so slowly that other people could have noticed. Or the opposite - being so fidgety or restless that you have been moving around a lot more than usual: 0 Thoughts that you would be better off dead, or of hurting yourself in some way: 0 PHQ-9 Total Score: 0 If you checked off any problems, how difficult have these  problems made it for you to do your work, take care of things at home, or get along with other people?: Not difficult at all  Depression Treatment Depression Interventions/Treatment : EYV7-0 Score <4 Follow-up Not Indicated     Goals Addressed             This Visit's Progress    07/16/2024: My goal is to stay well and fit.         Visit info / Clinical Intake: Medicare Wellness Visit Type:: Subsequent Annual Wellness Visit Persons participating in visit:: patient Medicare Wellness Visit Mode:: Telephone If telephone:: video declined Because this visit was a virtual/telehealth visit:: pt reported vitals If Telephone or Video please confirm:: I connected with the patient using audio enabled telemedicine application and verified that I am speaking with the correct person using two identifiers; I discussed the limitations of evaluation and management by telemedicine; The patient expressed understanding and agreed to proceed Patient Location:: HOME Provider Location:: OFFICE Information given by:: patient Interpreter Needed?: No Pre-visit prep was completed: yes AWV questionnaire completed by patient prior to visit?: no Living arrangements:: lives with spouse/significant other Patient's Overall Health Status Rating: good Typical amount of pain: none Does pain affect daily life?: no Are you currently prescribed opioids?: no  Dietary  Habits and Nutritional Risks How many meals a day?: 3 Eats fruit and vegetables daily?: yes Most meals are obtained by: preparing own meals In the last 2 weeks, have you had any of the following?: none Diabetic:: no  Functional Status Activities of Daily Living (to include ambulation/medication): Independent Ambulation: Independent with device- listed below Home Assistive Devices/Equipment: Elevated toliet seat; Eyeglasses; Shower/tub chair; Other (Comment) (GRAB BARS) Medication Administration: Independent Home Management: Independent Manage  your own finances?: yes Primary transportation is: driving Concerns about vision?: no *vision screening is required for WTM* Concerns about hearing?: no  Fall Screening Falls in the past year?: 0 Number of falls in past year: 0 Was there an injury with Fall?: 0 Fall Risk Category Calculator: 0 Patient Fall Risk Level: Low Fall Risk  Fall Risk Patient at Risk for Falls Due to: No Fall Risks Fall risk Follow up: Falls evaluation completed; Education provided  Home and Transportation Safety: All rugs have non-skid backing?: N/A, no rugs All stairs or steps have railings?: N/A, no stairs Grab bars in the bathtub or shower?: yes Have non-skid surface in bathtub or shower?: yes Good home lighting?: yes Regular seat belt use?: yes Hospital stays in the last year:: (!) yes How many hospital stays:: 1 Reason: CHF and PNEUMONIA  Cognitive Assessment Difficulty concentrating, remembering, or making decisions? : no Will 6CIT or Mini Cog be Completed: no 6CIT or Mini Cog Declined: patient alert, oriented, able to answer questions appropriately and recall recent events  Advance Directives (For Healthcare) Does Patient Have a Medical Advance Directive?: No Would patient like information on creating a medical advance directive?: No - Patient declined  Reviewed/Updated  Reviewed/Updated: Reviewed All (Medical, Surgical, Family, Medications, Allergies, Care Teams, Patient Goals)        Objective:    Today's Vitals   07/16/24 0839  Weight: 185 lb (83.9 kg)  Height: 5' 8 (1.727 m)  PainSc: 0-No pain   Body mass index is 28.13 kg/m.  Current Medications (verified) Outpatient Encounter Medications as of 07/16/2024  Medication Sig   albuterol  (PROVENTIL ) (2.5 MG/3ML) 0.083% nebulizer solution Take 3 mLs (2.5 mg total) by nebulization every 6 (six) hours as needed for wheezing or shortness of breath.   albuterol  (VENTOLIN  HFA) 108 (90 Base) MCG/ACT inhaler INHALE 2 PUFFS BY MOUTH  EVERY 6 HOURS AS NEEDED FOR WHEEZING FOR SHORTNESS OF BREATH   aspirin  EC 81 MG tablet Take 1 tablet (81 mg total) by mouth daily. Swallow whole.   budesonide -glycopyrrolate -formoterol  (BREZTRI  AEROSPHERE) 160-9-4.8 MCG/ACT AERO inhaler Inhale 2 puffs into the lungs 2 (two) times daily.   Evolocumab  (REPATHA  SURECLICK) 140 MG/ML SOAJ Inject 140 mg into the skin every 14 (fourteen) days.   fluticasone  (FLONASE ) 50 MCG/ACT nasal spray USE 1 SPRAY IN EACH NOSTRIL ONCE DAILY AS NEEDED FOR ALLERGIES OR RHINITIS   JARDIANCE  10 MG TABS tablet Take 1 tablet (10 mg total) by mouth daily. Must have office visit for refills   loratadine  (CLARITIN ) 10 MG tablet Take 1 tablet by mouth once daily   metoprolol  succinate (TOPROL -XL) 25 MG 24 hr tablet Take 0.5 tablets (12.5 mg total) by mouth daily.   montelukast  (SINGULAIR ) 10 MG tablet Take 1 tablet (10 mg total) by mouth at bedtime.   nitroGLYCERIN  (NITROSTAT ) 0.4 MG SL tablet Place 1 tablet (0.4 mg total) under the tongue every 5 (five) minutes x 3 doses as needed for chest pain.   omeprazole  (PRILOSEC) 20 MG capsule Take 1 capsule (20 mg total) by  mouth daily.   pravastatin  (PRAVACHOL ) 80 MG tablet Take 1 tablet (80 mg total) by mouth daily.   sacubitril -valsartan  (ENTRESTO ) 97-103 MG Take 0.5 tablets by mouth 2 (two) times daily.   ticagrelor  (BRILINTA ) 90 MG TABS tablet Take 1 tablet (90 mg total) by mouth 2 (two) times daily.   traZODone  (DESYREL ) 50 MG tablet TAKE HALF TABLET BY MOUTH AT BEDTIME   No facility-administered encounter medications on file as of 07/16/2024.   Hearing/Vision screen Hearing Screening - Comments:: Denies hearing difficulties.  Vision Screening - Comments:: Wears reading glasses. Up to date with [facility] Immunizations and Health Maintenance Health Maintenance  Topic Date Due   COLON CANCER SCREENING ANNUAL FOBT  Never done   Colonoscopy  Never done   Zoster Vaccines- Shingrix (1 of 2) Never done    OPHTHALMOLOGY EXAM  11/07/2023   Influenza Vaccine  03/22/2024   COVID-19 Vaccine (3 - 2025-26 season) 04/22/2024   Diabetic kidney evaluation - Urine ACR  09/07/2024   FOOT EXAM  09/07/2024   HEMOGLOBIN A1C  12/21/2024   Diabetic kidney evaluation - eGFR measurement  07/15/2025   Medicare Annual Wellness (AWV)  07/16/2025   Pneumococcal Vaccine: 50+ Years  Completed   Hepatitis C Screening  Completed   Meningococcal B Vaccine  Aged Out   DTaP/Tdap/Td  Discontinued        Assessment/Plan:  This is a routine wellness examination for Daequan.  Patient Care Team: Vicci Barnie NOVAK, MD as PCP - General (Internal Medicine) Inocencio Soyla Lunger, MD as PCP - Electrophysiology (Cardiology) Sheena Pugh, DO as PCP - Cardiology (Cardiology) Lucky Andrea LABOR, RN as [facility] Care Management  I have personally reviewed and noted the following in the patient's chart:   Medical and social history Use of alcohol , tobacco or illicit drugs  Current medications and supplements including opioid prescriptions. Functional ability and status Nutritional status Physical activity Advanced directives List of other physicians Hospitalizations, surgeries, and ER visits in previous 12 months Vitals Screenings to include cognitive, depression, and falls Referrals and appointments  No orders of the defined types were placed in this encounter.  In addition, I have reviewed and discussed with patient certain preventive protocols, quality metrics, and best practice recommendations. A written personalized care plan for preventive services as well as general preventive health recommendations were provided to patient.   Roz LOISE Fuller, LPN   [id number]   Return in 1 year (on 07/16/2025).  After Visit Summary: (Mail) Due to this being a telephonic visit, the after visit summary with patients personalized plan was offered to patient via mail   Nurse Notes: Patient aware of current care gaps.

## 2024-07-17 ENCOUNTER — Ambulatory Visit: Payer: Medicare HMO

## 2024-07-17 DIAGNOSIS — I5022 Chronic systolic (congestive) heart failure: Secondary | ICD-10-CM | POA: Diagnosis not present

## 2024-07-19 LAB — CUP PACEART REMOTE DEVICE CHECK
Battery Remaining Longevity: 52 mo
Battery Remaining Percentage: 53 %
Battery Voltage: 2.95 V
Brady Statistic AP VP Percent: 1 %
Brady Statistic AP VS Percent: 9.8 %
Brady Statistic AS VP Percent: 1 %
Brady Statistic AS VS Percent: 89 %
Brady Statistic RA Percent Paced: 8.5 %
Brady Statistic RV Percent Paced: 1 %
Date Time Interrogation Session: 20251126040019
HighPow Impedance: 72 Ohm
HighPow Impedance: 72 Ohm
Implantable Lead Connection Status: 753985
Implantable Lead Connection Status: 753985
Implantable Lead Implant Date: 20200824
Implantable Lead Implant Date: 20200824
Implantable Lead Location: 753859
Implantable Lead Location: 753860
Implantable Pulse Generator Implant Date: 20200824
Lead Channel Impedance Value: 340 Ohm
Lead Channel Impedance Value: 390 Ohm
Lead Channel Pacing Threshold Amplitude: 0.75 V
Lead Channel Pacing Threshold Amplitude: 1 V
Lead Channel Pacing Threshold Pulse Width: 0.5 ms
Lead Channel Pacing Threshold Pulse Width: 0.5 ms
Lead Channel Sensing Intrinsic Amplitude: 1.2 mV
Lead Channel Sensing Intrinsic Amplitude: 11.9 mV
Lead Channel Setting Pacing Amplitude: 2 V
Lead Channel Setting Pacing Amplitude: 2.5 V
Lead Channel Setting Pacing Pulse Width: 0.5 ms
Lead Channel Setting Sensing Sensitivity: 0.5 mV
Pulse Gen Serial Number: 1300954

## 2024-07-22 ENCOUNTER — Ambulatory Visit: Payer: Self-pay | Admitting: Cardiology

## 2024-07-22 NOTE — Progress Notes (Signed)
 Remote ICD Transmission

## 2024-07-24 ENCOUNTER — Other Ambulatory Visit: Payer: Self-pay | Admitting: Internal Medicine

## 2024-07-24 ENCOUNTER — Telehealth: Payer: Self-pay | Admitting: *Deleted

## 2024-07-24 ENCOUNTER — Encounter: Payer: Self-pay | Admitting: *Deleted

## 2024-07-24 ENCOUNTER — Telehealth: Payer: Self-pay | Admitting: Internal Medicine

## 2024-07-24 NOTE — Telephone Encounter (Signed)
 CONFIRMED APPT FOR 12/4

## 2024-07-25 ENCOUNTER — Ambulatory Visit: Attending: Internal Medicine | Admitting: Internal Medicine

## 2024-07-25 ENCOUNTER — Encounter: Payer: Self-pay | Admitting: Internal Medicine

## 2024-07-25 ENCOUNTER — Encounter: Payer: Self-pay | Admitting: *Deleted

## 2024-07-25 VITALS — BP 123/69 | HR 67 | Temp 97.7°F | Ht 68.0 in | Wt 201.0 lb

## 2024-07-25 DIAGNOSIS — I2510 Atherosclerotic heart disease of native coronary artery without angina pectoris: Secondary | ICD-10-CM | POA: Diagnosis not present

## 2024-07-25 DIAGNOSIS — E119 Type 2 diabetes mellitus without complications: Secondary | ICD-10-CM

## 2024-07-25 DIAGNOSIS — Z5320 Procedure and treatment not carried out because of patient's decision for unspecified reasons: Secondary | ICD-10-CM

## 2024-07-25 DIAGNOSIS — Z2821 Immunization not carried out because of patient refusal: Secondary | ICD-10-CM

## 2024-07-25 DIAGNOSIS — E1159 Type 2 diabetes mellitus with other circulatory complications: Secondary | ICD-10-CM

## 2024-07-25 DIAGNOSIS — I5042 Chronic combined systolic (congestive) and diastolic (congestive) heart failure: Secondary | ICD-10-CM | POA: Diagnosis not present

## 2024-07-25 DIAGNOSIS — I152 Hypertension secondary to endocrine disorders: Secondary | ICD-10-CM

## 2024-07-25 DIAGNOSIS — K219 Gastro-esophageal reflux disease without esophagitis: Secondary | ICD-10-CM

## 2024-07-25 DIAGNOSIS — Z7984 Long term (current) use of oral hypoglycemic drugs: Secondary | ICD-10-CM

## 2024-07-25 DIAGNOSIS — J449 Chronic obstructive pulmonary disease, unspecified: Secondary | ICD-10-CM

## 2024-07-25 MED ORDER — OMEPRAZOLE 40 MG PO CPDR: 40.0000 mg | DELAYED_RELEASE_CAPSULE | Freq: Every day | ORAL | 1 refills | Status: AC

## 2024-07-25 NOTE — Progress Notes (Signed)
 Patient ID: Stephen Jennings, male    DOB: [birth year]  MRN: [id number]  CC: Hospitalization Follow-up (Hospitalization f/u./No questions / concerns/No to all vax)   Subjective: Stephen Jennings is a 68 y.o. male who presents for chronic ds management. Stephen Jenkins Houseman NP is with me. His concerns today include:  patient with history of CAD(NSTEMI with stent to LAD 03/2019), ICM with last EF 30-35% 06/2024 (mild to moderate MR and MS 8/24), ICD present,  former smoker, COPD/asthma overlap (Dr. Annella), DM 2, anemia (resolved on labs 10/21   Discussed the use of AI scribe software for clinical note transcription with the patient, who gave verbal consent to proceed.  History of Present Illness Stephen Jennings is a 68 year old male with congestive heart failure who presents for a follow-up visit after recent hospitalization.  He was hospitalized about a month ago for several days due to decompensation of his congestive heart failure, possibly accompanied by a small myocardial infarction and pneumonia. During his hospitalization, he underwent a cardiac catheterization which showed no significant blockages, and an echocardiogram which revealed a left ventricular ejection fraction of 30-35% with questionable LT thrombus. Repeat echo on hosp f/u with cardiology showed no clot.  HTN/CAD/CHF: Since discharge, he has experienced no recurrence of shortness of breath, leg swelling, or orthopnea. His current medication regimen includes Entresto  97/103 mg (half tablet twice daily), aspirin  81 mg daily, metoprolol  XL 25 mg (half tablet daily), Repatha  injection every two weeks, Brilinta  90 mg twice daily, Jardiance  10 mg daily, and pravastatin  80 mg nightly. He is concerned about being on both Repatha  and pravastatin  due to conflicting instructions regarding his use.  Reports in the past the cardiologist had stopped the pravastatin  once he started the Repatha .  However on hospital discharge the pravastatin  was  restarted.  He has elevated lipoprotein a of 103.8 but LDL cholesterol was 38  DM: His diabetes is well-controlled with a recent A1c of 5.5%, and he continues to take Jardiance  daily. There was a concern about kidney function during his hospital stay, but it has since stabilized.  GERD: He has been experiencing increased acid reflux since his hospitalization, requiring daily omeprazole  20 mg, which he feels is less effective than before.  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 He is also managing care for his wife, who has stage four cancer and has recently suffered two leg fractures.   CODP: He uses his Breztri  inhaler twice daily and albuterol  as needed, but not daily.  He feels his breathing is stable.  HM: He is not ready to pursue colon cancer screening.  He has an ophthalmology exam that is scheduled already.    Patient Active Problem List   Diagnosis Date Noted   Acute on chronic systolic (congestive) heart failure (HCC) 06/23/2024   Controlled type 2 diabetes mellitus with hyperglycemia, without long-term current use of insulin  (HCC) 08/29/2022   Hyperlipidemia 02/06/2021   Elevated troponin    NSTEMI (non-ST elevated myocardial infarction) (HCC) 02/01/2021   Non-seasonal allergic rhinitis 01/20/2021   Pulmonary emphysema (HCC) 12/03/2020   SOB (shortness of breath) 12/02/2020   History of tobacco abuse 12/02/2020   Chronic systolic heart failure (HCC) 10/05/2020   Coronary artery disease of native artery of native heart with stable angina pectoris 10/05/2020   Gastroesophageal reflux disease without esophagitis 10/05/2020   Prediabetes 10/05/2020   Essential hypertension 06/08/2020   Obesity (  BMI 30.0-34.9) 06/08/2020   Snoring 11/28/2019   Morning headache 11/28/2019   Chronic daily headache 10/08/2019   Coronary artery disease involving left main coronary artery 05/08/2019   History of  placement of stent in LAD coronary artery 05/08/2019   Diabetes mellitus type II, controlled (HCC) 05/08/2019     Current Outpatient Medications on File Prior to Visit  Medication Sig Dispense Refill   albuterol  (PROVENTIL ) (2.5 MG/3ML) 0.083% nebulizer solution Take 3 mLs (2.5 mg total) by nebulization every 6 (six) hours as needed for wheezing or shortness of breath. 75 mL 12   albuterol  (VENTOLIN  HFA) 108 (90 Base) MCG/ACT inhaler INHALE 2 PUFFS BY MOUTH EVERY 6 HOURS AS NEEDED FOR WHEEZING FOR SHORTNESS OF BREATH 18 g 0   aspirin  EC 81 MG tablet Take 1 tablet (81 mg total) by mouth daily. Swallow whole. 30 tablet 12   budesonide -glycopyrrolate -formoterol  (BREZTRI  AEROSPHERE) 160-9-4.8 MCG/ACT AERO inhaler Inhale 2 puffs into the lungs 2 (two) times daily. 3 each 4   Evolocumab  (REPATHA  SURECLICK) 140 MG/ML SOAJ Inject 140 mg into the skin every 14 (fourteen) days. 6 mL 3   fluticasone  (FLONASE ) 50 MCG/ACT nasal spray USE 1 SPRAY IN EACH NOSTRIL ONCE DAILY AS NEEDED FOR ALLERGIES OR RHINITIS 48 g 0   JARDIANCE  10 MG TABS tablet TAKE 1 TABLET (10 MG TOTAL) BY MOUTH DAILY. 90 tablet 1   loratadine  (CLARITIN ) 10 MG tablet Take 1 tablet by mouth once daily 90 tablet 3   metoprolol  succinate (TOPROL -XL) 25 MG 24 hr tablet Take 0.5 tablets (12.5 mg total) by mouth daily. 30 tablet 1   montelukast  (SINGULAIR ) 10 MG tablet Take 1 tablet (10 mg total) by mouth at bedtime. 30 tablet 0   nitroGLYCERIN  (NITROSTAT ) 0.4 MG SL tablet Place 1 tablet (0.4 mg total) under the tongue every 5 (five) minutes x 3 doses as needed for chest pain. 25 tablet 3   pravastatin  (PRAVACHOL ) 80 MG tablet Take 1 tablet (80 mg total) by mouth daily. 90 tablet 1   sacubitril -valsartan  (ENTRESTO ) 97-103 MG Take 0.5 tablets by mouth 2 (two) times daily.     ticagrelor  (BRILINTA ) 90 MG TABS tablet Take 1 tablet (90 mg total) by mouth 2 (two) times daily. 60 tablet 11   traZODone  (DESYREL ) 50 MG tablet TAKE HALF TABLET BY MOUTH AT  BEDTIME 30 tablet 1   No current facility-administered medications on file prior to visit.    Allergies  Allergen Reactions   Aldactone  [Spironolactone ] Other (See Comments)    Gynecomastia    Lipitor  [Atorvastatin ] Other (See Comments)    Myalgias Fatigue   Glucophage  [Metformin ] Anxiety and Palpitations    Social History   Socioeconomic History   Marital status: Married    Spouse name: Not on file   Number of children: 1   Years of education: 8   Highest education level: 8th grade  Occupational History   Not on file  Tobacco Use   Smoking status: Former    Current packs/day: 0.00    Types: Cigarettes    Quit date: 04/08/2019    Years since quitting: 5.3    Passive exposure: Current (wife smokes)   Smokeless tobacco: Never  Vaping Use   Vaping status: Never Used  Substance and Sexual Activity   Alcohol  use: No   Drug use: No   Sexual activity: Not Currently  Other Topics Concern   Not on file  Social History Narrative   Lives with wife   Right handed  Caffeine: about 2 cups of coffee every morning, no soda   Social Drivers of Corporate Investment Banker Strain: Low Risk  (07/16/2024)   Overall Financial Resource Strain (CARDIA)    Difficulty of Paying Living Expenses: Not hard at all  Food Insecurity: No Food Insecurity (07/16/2024)   Hunger Vital Sign    Worried About Running Out of Food in the Last Year: Never true    Ran Out of Food in the Last Year: Never true  Transportation Needs: No Transportation Needs (07/16/2024)   PRAPARE - Administrator, Civil Service (Medical): No    Lack of Transportation (Non-Medical): No  Physical Activity: Inactive (07/16/2024)   Exercise Vital Sign    Days of Exercise per Week: 0 days    Minutes of Exercise per Session: 0 min  Stress: No Stress Concern Present (07/16/2024)   [employer] of Occupational Health - Occupational Stress Questionnaire    Feeling of Stress: Not at all  Social Connections:  Moderately Isolated (07/16/2024)   Social Connection and Isolation Panel    Frequency of Communication with Friends and Family: More than three times a week    Frequency of Social Gatherings with Friends and Family: Twice a week    Attends Religious Services: Never    Database Administrator or Organizations: No    Attends Banker Meetings: Never    Marital Status: Married  Catering Manager Violence: Not At Risk (07/16/2024)   Humiliation, Afraid, Rape, and Kick questionnaire    Fear of Current or Ex-Partner: No    Emotionally Abused: No    Physically Abused: No    Sexually Abused: No    Family History  Problem Relation Age of Onset   Hypertension Mother    Heart failure Mother    Headache Neg Hx    Migraines Neg Hx     Past Surgical History:  Procedure Laterality Date   CARDIAC CATHETERIZATION     CORONARY BALLOON ANGIOPLASTY N/A 02/04/2021   Procedure: CORONARY BALLOON ANGIOPLASTY;  Surgeon: Burnard Debby LABOR, MD;  Location: [facility]ASIVE [facility];  Service: Cardiovascular;  Laterality: N/A;   CORONARY THROMBECTOMY N/A 02/04/2021   Procedure: Coronary Thrombectomy;  Surgeon: Burnard Debby LABOR, MD;  Location: [facility] INVASIVE CV LAB;  Service: Cardiovascular;  Laterality: N/A;   CORONARY ULTRASOUND/IVUS N/A 02/04/2021   Procedure: Intravascular Ultrasound/IVUS;  Surgeon: Burnard Debby LABOR, MD;  Location: [facility] INVASIVE CV LAB;  Service: Cardiovascular;  Laterality: N/A;   CORONARY/GRAFT ACUTE MI REVASCULARIZATION N/A 03/31/2019   Procedure: Coronary/Graft Acute MI Revascularization;  Surgeon: Burnard Debby LABOR, MD;  Location: [facility] IN[facility]IVE CV [facility];  Service: Cardiovascular;  Laterality: N/A;   ICD IMPLANT N/A 04/15/2019   Procedure: ICD IMPLANT;  Surgeon: Inocencio Soyla Lunger, MD;  Location: [facility] INVASIVE CV LAB;  Service: Cardiovascular;  Laterality: N/A;   LEFT HEART CATH AND CORONARY ANGIOGRAPHY N/A 03/31/2019   Procedure: LEFT HEART CATH AND CORONARY ANGIOGRAPHY;  Surgeon: Burnard Debby LABOR, MD;   Location: [facility] CV [facility];  Service: Cardiovascular;  Laterality: N/A;   LEFT HEART CATH AND CORONARY ANGIOGRAPHY N/A 02/02/2021   Procedure: LEFT HEART CATH AND CORONARY ANGIOGRAPHY;  Surgeon: Burnard Debby LABOR, MD;  Location: [facility];  Service: Cardiovascular;  Laterality: N/A;   LEFT HEART CATH AND CORONARY ANGIOGRAPHY N/A 02/04/2021   Procedure: LEFT HEART CATH AND CORONARY ANGIOGRAPHY;  Surgeon: Burnard Debby LABOR, MD;  Location: [facility];  Service: Cardiovascular;  Laterality:  N/A;   NO PAST SURGERIES     RIGHT HEART CATH N/A 03/31/2019   Procedure: RIGHT HEART CATH;  Surgeon: Burnard Debby LABOR, MD;  Location: [facility] INVASIVE CV LAB;  Service: Cardiovascular;  Laterality: N/A;   RIGHT/LEFT HEART CATH AND CORONARY ANGIOGRAPHY N/A 06/25/2024   Procedure: RIGHT/LEFT HEART CATH AND CORONARY ANGIOGRAPHY;  Surgeon: Elmira Newman PARAS, MD;  Location: [facility] CV [facility];  Service: Cardiovascular;  Laterality: N/A;   VENTRICULAR ASSIST DEVICE INSERTION N/A 03/31/2019   Procedure: VENTRICULAR ASSIST DEVICE INSERTION;  Surgeon: Burnard Debby LABOR, MD;  Location: [facility];  Service: Cardiovascular;  Laterality: N/A;    ROS: Review of Systems Negative except as stated above  PHYSICAL EXAM: BP 123/69 (BP Location: Left Arm, Patient Position: Sitting, Cuff Size: Large)   Jennings 67   Temp 97.7 F (36.5 C) (Oral)   Ht 5' 8 (1.727 m)   Wt 201 lb (91.2 kg)   SpO2 96%   BMI 30.56 kg/m   Physical Exam General appearance - alert, well appearing, older Caucasian male and in no distress Mental status - normal mood, behavior, speech, dress, motor activity, and thought processes Neck - supple, no significant adenopathy Chest - breath sounds decrease BL, equal, no wheezes Heart - normal rate, regular rhythm, normal S1, S2, no murmurs, rubs, clicks or gallops Extremities - no LE edema  Lab Results  Component Value Date   HGBA1C 5.5 06/23/2024       Latest Ref Rng & Units 07/15/2024     9:38 AM 07/01/2024    2:03 PM 06/26/2024    7:48 AM  CMP  Glucose 70 - 99 mg/dL 877  89  798   BUN 8 - 23 mg/dL 24  18  31    Creatinine 0.61 - 1.24 mg/dL 8.75  8.89  8.42   Sodium 135 - 145 mmol/L 139  140  138   Potassium 3.5 - 5.1 mmol/L 3.9  4.9  3.5   Chloride 98 - 111 mmol/L 103  103  96   CO2 22 - 32 mmol/L 24  23  27    Calcium  8.9 - 10.3 mg/dL 9.0  9.6  9.3   Total Protein 6.0 - 8.5 g/dL  7.0    Total Bilirubin 0.0 - 1.2 mg/dL  1.0    Alkaline Phos 47 - 123 IU/L  92    AST 0 - 40 IU/L  23    ALT 0 - 44 IU/L  24     Lipid Panel     Component Value Date/Time   CHOL 103 05/13/2024 0910   TRIG 58 05/13/2024 0910   HDL 52 05/13/2024 0910   CHOLHDL 2.0 05/13/2024 0910   CHOLHDL 5.6 08/12/2022 1056   VLDL 14 08/12/2022 1056   LDLCALC 38 05/13/2024 0910    CBC    Component Value Date/Time   WBC 8.3 06/26/2024 0228   RBC 5.19 06/26/2024 0228   HGB 15.4 06/26/2024 0228   HGB 13.4 12/26/2023 0928   HCT 46.8 06/26/2024 0228   HCT 42.0 12/26/2023 0928   PLT 283 06/26/2024 0228   PLT 218 12/26/2023 0928   MCV 90.2 06/26/2024 0228   MCV 94 12/26/2023 0928   MCH 29.7 06/26/2024 0228   MCHC 32.9 06/26/2024 0228   RDW 13.9 06/26/2024 0228   RDW 13.5 12/26/2023 0928   LYMPHSABS 0.8 06/24/2024 0207   MONOABS 1.1 (H) 06/24/2024 0207   EOSABS 0.0 06/24/2024 0207   BASOSABS 0.0 06/24/2024  0207    ASSESSMENT AND PLAN: 1. Chronic combined systolic and diastolic CHF (congestive heart failure) (HCC) (Primary) Stable and compensated.  Continue Entresto , Jardiance , metoprolol   2. Diabetes mellitus treated with oral medication (HCC) Controlled.  Continue Jardiance   3. Coronary artery disease involving native coronary artery of native heart without angina pectoris Stable.  Continue Brilinta  Repatha , metoprolol , aspirin .  Will send message to cardiology nurse practitioner who saw him recently to inquire whether he should continue pravastatin   4. Hypertension associated with  type 2 diabetes mellitus (HCC) Controlled.  Continue Entresto  metoprolol   5. Chronic obstructive pulmonary disease, unspecified COPD type (HCC) Stable on Breztri  inhaler  6. Gastroesophageal reflux disease without esophagitis GERD precautions discussed.  Advised to avoid certain foods like spicy foods, tomato-based foods, juices and excessive caffeine.  Advised to eat his last meal at least 2 to 3 hours before laying down at nights and to sleep with his head slightly elevated.   Increase omeprazole  to 40 mg daily - omeprazole  (PRILOSEC) 40 MG capsule; Take 1 capsule (40 mg total) by mouth daily.  Dispense: 90 capsule; Refill: 1  7. Colon cancer screening declined Patient is declining all forms of colon cancer screening for now.  8. Influenza vaccination declined Recommended.  Patient declined.    Patient was given the opportunity to ask questions.  Patient verbalized understanding of the plan and was able to repeat key elements of the plan.   This documentation was completed using Paediatric nurse.  Any transcriptional errors are unintentional.  No orders of the defined types were placed in this encounter.    Requested Prescriptions   Signed Prescriptions Disp Refills   omeprazole  (PRILOSEC) 40 MG capsule 90 capsule 1    Sig: Take 1 capsule (40 mg total) by mouth daily.    Return in about 4 months (around 11/23/2024).  Barnie Louder, MD, FACP

## 2024-07-25 NOTE — Patient Instructions (Signed)
  VISIT SUMMARY: You had a follow-up visit today to review your health after your recent hospitalization for heart failure, a possible small heart attack, and pneumonia. We discussed your current medications, your well-controlled diabetes, and your increased acid reflux symptoms. We also reviewed your general health maintenance needs.  YOUR PLAN: -CHRONIC SYSTOLIC HEART FAILURE: Chronic systolic heart failure means your heart's ability to pump blood is weaker than normal. Your ejection fraction is 30-35%, which is below normal. Continue taking Entresto , aspirin , metoprolol , Brilinta , Jardiance , and pravastatin  as prescribed. We will consult with your cardiologist about the necessity of Repatha .  -CORONARY ARTERY DISEASE: Coronary artery disease is a condition where the blood vessels supplying your heart are narrowed or blocked. Your recent catheterization showed no significant issues. Continue your current cardiac medications as part of your heart failure management.  -TYPE 2 DIABETES MELLITUS: Type 2 diabetes is a condition where your body does not use insulin  properly. Your diabetes is well-controlled with an A1c of 5.5%. Continue taking Jardiance  10 mg daily.  -CHRONIC OBSTRUCTIVE PULMONARY DISEASE (COPD): COPD is a chronic lung disease that makes it hard to breathe. You do not need to use your albuterol  inhaler daily. Continue using your Breztri  inhaler twice daily and use the albuterol  inhaler as needed.  -GASTROESOPHAGEAL REFLUX DISEASE (GERD): GERD is a condition where stomach acid frequently flows back into the tube connecting your mouth and stomach. Your symptoms have increased since your hospitalization. Increase your omeprazole  to 40 mg daily, avoid foods that trigger acid reflux, eat your last meal 1-2 hours before lying down, and sleep with your head elevated.  -GENERAL HEALTH MAINTENANCE: You need to complete your colon cancer screening when you are ready and attend your scheduled eye  exam with Dr. Eyvonne. You have declined the flu vaccine.  INSTRUCTIONS: Please follow up with your cardiologist regarding the necessity of Repatha . Complete your colon cancer screening when you are ready and attend your scheduled eye exam with Dr. Eyvonne.                      Contains text generated by [employer]bridge.                                 Contains text generated by [employer]bridge.

## 2024-07-26 ENCOUNTER — Encounter: Payer: Self-pay | Admitting: *Deleted

## 2024-07-28 ENCOUNTER — Telehealth: Payer: Self-pay | Admitting: Internal Medicine

## 2024-07-28 NOTE — Telephone Encounter (Signed)
 Let patient know that I heard back from the cardiology PA.  They want him to remain on both Repatha  and the oral cholesterol-lowering medication.

## 2024-07-28 NOTE — Telephone Encounter (Signed)
-----   Message from Thom LITTIE Sluder sent at 07/25/2024  6:36 PM EST ----- Regarding: RE: Repatha  and Pravachol  Should be on both. ----- Message ----- From: Vicci Barnie NOVAK, MD Sent: 07/25/2024   6:31 PM EST To: Thom LITTIE Sluder, [name] Subject: Repatha  and Pravachol                           I am the PCP for this patient.  I saw him today in follow-up.  He had a question of whether he needed to be on both the Repatha  and Pravachol .  He reports that in the past the cardiologist had him stop the Pravachol  once he started the Repatha .

## 2024-07-29 ENCOUNTER — Telehealth (HOSPITAL_COMMUNITY): Payer: Self-pay

## 2024-07-29 NOTE — Telephone Encounter (Signed)
 Called to confirm/remind patient of [facility] on [facility].   Appointment:   [] Confirmed  [x] Left mess   [] No answer/No voice mail  [] VM Full/unable to leave message  [] Phone not in service  And to bring in all medications and/or complete list.

## 2024-07-29 NOTE — Telephone Encounter (Signed)
 Called & spoke to the patient. Verified name & DOB. Informed patient of Dr.Johnson's message as follow  Let patient know that I heard back from the cardiology PA.  They want him to remain on both Repatha  and the oral cholesterol-lowering medication. Patient expressed verbal understanding of all discussed.

## 2024-07-29 NOTE — Progress Notes (Signed)
 Advanced Heart Failure Clinic Note   PCP: Vicci Barnie NOVAK, MD Primary Cardiologist: Kardie Tobb, DO  HF Cardiologist: Dr. Cherrie  HPI: Stephen Jennings is a 68 y.o. 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    Brought to ER 8/20 with respiratory failure/cardiac arrest. ECG with anterior ST elevation. Cath with 3v CAD and totally occluded prox LAD. LCX anomalous off RCA. Underwent PCI of LAD which was diffusely diseased vessel. Developed cardiogenic shock requiring pressors and mechanical support with impella.  8/20 Echo 20-25%. Hospital course complicated by Torsades. EP consulted placed ICD prior to discharge.    He stopped Brillinta in 4/22. Admitted in 6/22 with NSTEMI. Cath with patent proximal LAD stent with mild 20% in-stent narrowing.  There is significant thrombus burden immediately after the stent extending into the diagonal vessel and mid LAD with TIMI-3 flow. Treated with Aggrastat  x 48 hours. ECHO 6/22 EF 35-40% Relook angiography of the left coronary system showed improvement in the previous thrombus burden in the diagonal and LAD distal stent and beyond but moderate residual thrombus was still present. Underwent successful percutaneous coronary intervention of the LAD utilizing Pronto thrombectomy, intravascular ultrasound and ultimate PTCA of the distal stented segment and mid LAD with residual narrowing 0%. Recommended indefinite DAPT.   Readmitted 06/25/24 with NSTEMI. Cath showed patent stents and moderate nonobstructive disease. Normal filling pressures and marginally low CI. Echo with EF 30-35%, trabeculae concerning for LV thrombus, nl RV.  Was seen for cardiology f/u 07/01/24 doing well, repeat echo negative for thrombus.   Today he returns for HF follow up. Overall feeling fine. No undue dyspnea with activity. Denies palpitations, abnormal bleeding, CP, dizziness, edema, or PND/Orthopnea. Appetite ok. Weight at home 196 pounds. Taking all medications, take Lasix   20 mg bid x years.  Cardiac Studies: - Echo 11/25: EF 30-35% - Echo 6/25 EF 40-45% - Echo 12/22 EF 35-40%. - Echo 4/22 EF 55-60%   - Echo 2/21  EF 45-50%   Past Medical History:  Diagnosis Date   Cardiogenic shock (HCC)    CHF (congestive heart failure) (HCC)    Coronary artery disease    Diabetes mellitus without complication (HCC)    Hyperlipidemia    Medical history non-contributory    Current Outpatient Medications  Medication Sig Dispense Refill   albuterol  (PROVENTIL ) (2.5 MG/3ML) 0.083% nebulizer solution Take 3 mLs (2.5 mg total) by nebulization every 6 (six) hours as needed for wheezing or shortness of breath. 75 mL 12   albuterol  (VENTOLIN  HFA) 108 (90 Base) MCG/ACT inhaler INHALE 2 PUFFS BY MOUTH EVERY 6 HOURS AS NEEDED FOR WHEEZING FOR SHORTNESS OF BREATH 18 g 0   aspirin  EC 81 MG tablet Take 1 tablet (81 mg total) by mouth daily. Swallow whole. 30 tablet 12   budesonide -glycopyrrolate -formoterol  (BREZTRI  AEROSPHERE) 160-9-4.8 MCG/ACT AERO inhaler Inhale 2 puffs into the lungs 2 (two) times daily. 3 each 4   Evolocumab  (REPATHA  SURECLICK) 140 MG/ML SOAJ Inject 140 mg into the skin every 14 (fourteen) days. 6 mL 3   fluticasone  (FLONASE ) 50 MCG/ACT nasal spray USE 1 SPRAY IN EACH NOSTRIL ONCE DAILY AS NEEDED FOR ALLERGIES OR RHINITIS 48 g 0   JARDIANCE  10 MG TABS tablet TAKE 1 TABLET (10 MG TOTAL) BY MOUTH DAILY. 90 tablet 1   loratadine  (CLARITIN ) 10 MG tablet Take 1 tablet by mouth once daily 90 tablet 3   metoprolol  succinate (TOPROL -XL) 25 MG 24 hr tablet Take 0.5 tablets (12.5 mg  total) by mouth daily. 30 tablet 1   montelukast  (SINGULAIR ) 10 MG tablet Take 1 tablet (10 mg total) by mouth at bedtime. 30 tablet 0   nitroGLYCERIN  (NITROSTAT ) 0.4 MG SL tablet Place 1 tablet (0.4 mg total) under the tongue every 5 (five) minutes x 3 doses as needed for chest pain. 25 tablet 3   omeprazole  (PRILOSEC) 40 MG capsule Take 1 capsule (40 mg total) by mouth daily. 90 capsule 1    pravastatin  (PRAVACHOL ) 80 MG tablet Take 1 tablet (80 mg total) by mouth daily. 90 tablet 1   sacubitril -valsartan  (ENTRESTO ) 97-103 MG Take 0.5 tablets by mouth 2 (two) times daily.     ticagrelor  (BRILINTA ) 90 MG TABS tablet Take 1 tablet (90 mg total) by mouth 2 (two) times daily. 60 tablet 11   traZODone  (DESYREL ) 50 MG tablet TAKE HALF TABLET BY MOUTH AT BEDTIME 30 tablet 1   No current facility-administered medications for this encounter.   Allergies  Allergen Reactions   Aldactone  [Spironolactone ] Other (See Comments)    Gynecomastia    Lipitor  [Atorvastatin ] Other (See Comments)    Myalgias Fatigue   Glucophage  [Metformin ] Anxiety and Palpitations   Social History   Socioeconomic History   Marital status: Married    Spouse name: Not on file   Number of children: 1   Years of education: 8   Highest education level: 8th grade  Occupational History   Not on file  Tobacco Use   Smoking status: Former    Current packs/day: 0.00    Types: Cigarettes    Quit date: 04/08/2019    Years since quitting: 5.3    Passive exposure: Current (wife smokes)   Smokeless tobacco: Never  Vaping Use   Vaping status: Never Used  Substance and Sexual Activity   Alcohol  use: No   Drug use: No   Sexual activity: Not Currently  Other Topics Concern   Not on file  Social History Narrative   Lives with wife   Right handed   Caffeine: about 2 cups of coffee every morning, no soda   Social Drivers of Corporate Investment Banker Strain: Low Risk  (07/16/2024)   Overall Financial Resource Strain (CARDIA)    Difficulty of Paying Living Expenses: Not hard at all  Food Insecurity: No Food Insecurity (07/16/2024)   Hunger Vital Sign    Worried About Running Out of Food in the Last Year: Never true    Ran Out of Food in the Last Year: Never true  Transportation Needs: No Transportation Needs (07/16/2024)   PRAPARE - Administrator, Civil Service (Medical): No    Lack of  Transportation (Non-Medical): No  Physical Activity: Inactive (07/16/2024)   Exercise Vital Sign    Days of Exercise per Week: 0 days    Minutes of Exercise per Session: 0 min  Stress: No Stress Concern Present (07/16/2024)   [employer] of Occupational Health - Occupational Stress Questionnaire    Feeling of Stress: Not at all  Social Connections: Moderately Isolated (07/16/2024)   Social Connection and Isolation Panel    Frequency of Communication with Friends and Family: More than three times a week    Frequency of Social Gatherings with Friends and Family: Twice a week    Attends Religious Services: Never    Database Administrator or Organizations: No    Attends Banker Meetings: Never    Marital Status: Married  Catering Manager Violence: Not At Risk (  07/16/2024)   Humiliation, Afraid, Rape, and Kick questionnaire    Fear of Current or Ex-Partner: No    Emotionally Abused: No    Physically Abused: No    Sexually Abused: No   Family History  Problem Relation Age of Onset   Hypertension Mother    Heart failure Mother    Headache Neg Hx    Migraines Neg Hx    Wt Readings from Last 3 Encounters:  07/30/24 90.2 kg (198 lb 12.8 oz)  07/25/24 91.2 kg (201 lb)  07/16/24 83.9 kg (185 lb)   BP 122/80   Pulse 71   Ht 5' 8 (1.727 m)   Wt 90.2 kg (198 lb 12.8 oz)   SpO2 94%   BMI 30.23 kg/m   PHYSICAL EXAM: General:  NAD. No resp difficulty, walked into clinic HEENT: Normal Neck: Supple. No JVD. Cor: Regular rate & rhythm. No rubs, gallops or murmurs. Lungs: Clear Abdomen: Soft, nontender, nondistended.  Extremities: No cyanosis, clubbing, rash, edema Neuro: Alert & oriented x 3, moves all 4 extremities w/o difficulty. Affect pleasant.  Device Interrogation (personally reviewed): CorVue recently suggested hypervolemia but daily impedence back to reference line, <1% AT/AF burden, 8.2% AP, no VT  ASSESSMENT & PLAN: 1. Chronic systolic HF - Echo 8/20  EF 20-25% - Echo 09/23/19: EF 45-50% - Echo 4/22: EF 55-60%  - Echo 12/22 EF 35-40%. - Echo 6/25 EF 40-45% - Echo 11/25: EF 30-35% - NYHA I. Volume status ok  - Restart eplerenone  25 mg daily - Change Lasix  20 mg to PRN - Continue Entresto  49/51 mg bid  - Continue Toprol  XL 12.5 mg daily.  - Continue Jardiance  10 mg daily. - Labs today, repeat BMET in 1 week - Repeat echo next visit  2. 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 - No VT on interrogation   3. CAD  - s/p acute anterior MI 8/20 with PCI/DES to LAD. Diffusely diseased vessel + residual CAD in RCA and anomalous LCX. - NSTEMI in 6/22 after stopping Brillinta. Stent patent but clot after stent -> s/p thrombectomy and POBA - NSTEMI 11/25: patent LAD stent, nonobstructive disease in Lcx/RCA. - No chest pain - Continue DAPT and statin indefinitely.  - Continue Repatha  - Last LDL 38 - We discussed CR today, he declines.   4. DM2 - On Jardiance .  - Per PCP   5. COPD - Stable. Continue inhalers - No longer smoking  6. HTN - BP well controlled - Meds as above   Follow up in 3-4 months with Dr. Cherrie + echo  Harlene HERO [place], [place] 07/30/24

## 2024-07-30 ENCOUNTER — Ambulatory Visit (HOSPITAL_COMMUNITY)
Admission: RE | Admit: 2024-07-30 | Discharge: 2024-07-30 | Disposition: A | Source: Ambulatory Visit | Attending: Family Medicine

## 2024-07-30 ENCOUNTER — Ambulatory Visit (HOSPITAL_COMMUNITY): Payer: Self-pay | Admitting: Family Medicine

## 2024-07-30 ENCOUNTER — Other Ambulatory Visit (HOSPITAL_COMMUNITY): Payer: Self-pay

## 2024-07-30 ENCOUNTER — Encounter (HOSPITAL_COMMUNITY): Payer: Self-pay

## 2024-07-30 VITALS — BP 122/80 | HR 71 | Ht 68.0 in | Wt 198.8 lb

## 2024-07-30 DIAGNOSIS — E119 Type 2 diabetes mellitus without complications: Secondary | ICD-10-CM

## 2024-07-30 DIAGNOSIS — I5022 Chronic systolic (congestive) heart failure: Secondary | ICD-10-CM

## 2024-07-30 DIAGNOSIS — I2510 Atherosclerotic heart disease of native coronary artery without angina pectoris: Secondary | ICD-10-CM

## 2024-07-30 DIAGNOSIS — Z95810 Presence of automatic (implantable) cardiac defibrillator: Secondary | ICD-10-CM

## 2024-07-30 DIAGNOSIS — J449 Chronic obstructive pulmonary disease, unspecified: Secondary | ICD-10-CM

## 2024-07-30 DIAGNOSIS — I10 Essential (primary) hypertension: Secondary | ICD-10-CM

## 2024-07-30 LAB — BRAIN NATRIURETIC PEPTIDE: B Natriuretic Peptide: 159.8 pg/mL — ABNORMAL HIGH (ref 0.0–100.0)

## 2024-07-30 LAB — BASIC METABOLIC PANEL WITH GFR
Anion gap: 11 (ref 5–15)
BUN: 17 mg/dL (ref 8–23)
CO2: 28 mmol/L (ref 22–32)
Calcium: 9.2 mg/dL (ref 8.9–10.3)
Chloride: 101 mmol/L (ref 98–111)
Creatinine, Ser: 1.24 mg/dL (ref 0.61–1.24)
GFR, Estimated: >60 mL/min (ref >60)
Glucose, Bld: 112 mg/dL — ABNORMAL HIGH (ref 70–99)
Potassium: 3.6 mmol/L (ref 3.5–5.1)
Sodium: 140 mmol/L (ref 135–145)

## 2024-07-30 MED ORDER — FUROSEMIDE 20 MG PO TABS: 20.0000 mg | ORAL_TABLET | Freq: Every day | ORAL | 5 refills | Status: AC | PRN

## 2024-07-30 MED ORDER — EPLERENONE 25 MG PO TABS: 25.0000 mg | ORAL_TABLET | Freq: Every day | ORAL | 5 refills | Status: AC

## 2024-07-30 NOTE — Patient Instructions (Addendum)
 Medication Changes:  RESTART EPLERENONE  25MG  ONCE DAILY   CHANGE LASIX  (FUROSEMIDE ) TO 20MG  ONCE DAILY ONLY AS NEEDED FOR SWELLING OR WEIGHT GAIN OF 3 POUNDS OVERNIGHT OR 5 POUNDS IN 1 WEEK   Lab Work:  Labs done today, your results will be available in MyChart, we will contact you for abnormal readings.  AND THEN LABS AGAIN IN 1 WEEK AS SCHEDULED   Follow-Up in: 4 MONTHS WITH DR. CHERRIE with an ECHO PLEASE CALL OUR OFFICE AROUND FEB/MARCH 2026 TO GET SCHEDULED FOR YOUR APPOINTMENT. PHONE NUMBER IS [phone] OPTION 2   At the [facility], you and your health needs are our priority. We have a designated team specialized in the treatment of Heart Failure. This Care Team includes your primary Heart Failure Specialized Cardiologist (physician), Advanced Practice Providers (APPs- Physician Assistants and Nurse Practitioners), and Pharmacist who all work together to provide you with the care you need, when you need it.   You may see any of the following providers on your designated Care Team at your next follow up:  Dr. Toribio Cherrie Dr. Ezra Shuck Dr. Odis Brownie Greig Mosses, NP Caffie Shed, [place] [facility] [place], [place] Beckey Coe, NP Jordan Lee, NP Tinnie Redman, PharmD   Please be sure to bring in all your medications bottles to every appointment.   Need to Contact Us :  If you have any questions or concerns before your next appointment please send us  a message through [place] or call our office at [phone].    TO LEAVE A MESSAGE FOR THE NURSE SELECT OPTION 2, PLEASE LEAVE A MESSAGE INCLUDING: YOUR NAME DATE OF BIRTH CALL BACK NUMBER REASON FOR CALL**this is important as we prioritize the call backs  YOU WILL RECEIVE A CALL BACK THE SAME DAY AS LONG AS YOU CALL BEFORE 4:00 PM

## 2024-07-31 ENCOUNTER — Telehealth: Payer: Self-pay | Admitting: Internal Medicine

## 2024-07-31 NOTE — Telephone Encounter (Signed)
 Patient came in the office today 12/10 and dropped off Patient Assistance Program form (PAP). Form placed in provider's box.

## 2024-08-01 NOTE — Telephone Encounter (Signed)
 Any PAP form for medications should be sent to [facility] in pharmacy.

## 2024-08-03 ENCOUNTER — Other Ambulatory Visit: Payer: Self-pay | Admitting: Internal Medicine

## 2024-08-03 DIAGNOSIS — J439 Emphysema, unspecified: Secondary | ICD-10-CM

## 2024-08-05 ENCOUNTER — Ambulatory Visit: Admitting: Internal Medicine

## 2024-08-06 ENCOUNTER — Ambulatory Visit (HOSPITAL_COMMUNITY)

## 2024-08-07 ENCOUNTER — Other Ambulatory Visit: Payer: Self-pay | Admitting: Pulmonary Disease

## 2024-08-07 DIAGNOSIS — J329 Chronic sinusitis, unspecified: Secondary | ICD-10-CM

## 2024-08-07 DIAGNOSIS — J439 Emphysema, unspecified: Secondary | ICD-10-CM

## 2024-08-09 ENCOUNTER — Other Ambulatory Visit (HOSPITAL_COMMUNITY): Payer: Self-pay | Admitting: Internal Medicine

## 2024-08-09 ENCOUNTER — Other Ambulatory Visit: Payer: Self-pay

## 2024-08-09 MED ORDER — TICAGRELOR 90 MG PO TABS
90.0000 mg | ORAL_TABLET | Freq: Two times a day (BID) | ORAL | 11 refills | Status: AC
  Filled 2024-08-09: qty 60, 30d supply, fill #0

## 2024-08-12 ENCOUNTER — Encounter: Payer: Self-pay | Admitting: Internal Medicine

## 2024-08-16 ENCOUNTER — Telehealth: Payer: Self-pay | Admitting: Internal Medicine

## 2024-08-16 NOTE — Telephone Encounter (Signed)
 Copied from CRM #[id number]. Topic: General - Other >> Aug 16, 2024  9:32 AM Jasmin G wrote:  Reason for CRM: [employer] called to request supporting documentation for chronic condition diagnosis as pt requested to join a special plan. Fax over documentation at [phone].

## 2024-08-16 NOTE — Telephone Encounter (Signed)
 Called & spoke to a [facility] representative that stated the last page still needs to be completed. Please select the conditions applicable to the patient. Form left on your desk for completion. Please let me know when it is ready so I can fax it back. Thank you.

## 2024-08-16 NOTE — Telephone Encounter (Signed)
 I did not call the patient.

## 2024-08-16 NOTE — Telephone Encounter (Signed)
 I am very sure I completed the form last week or the week before and gave to you in the forms envelope last Thursday.

## 2024-08-16 NOTE — Telephone Encounter (Signed)
 Form placed in provider box for completion. Please advise when it is signed and ready to be faxed back. Thank you.

## 2024-08-16 NOTE — Telephone Encounter (Signed)
 Called [facility] and spoke to Leonard B. Requested for the verification form to be faxed to [facility]. Chandra confirmed that it will be faxed to [phone]. Awaiting fax.

## 2024-08-16 NOTE — Telephone Encounter (Signed)
 No notes or information in the system.

## 2024-08-16 NOTE — Telephone Encounter (Signed)
 Copied from CRM #[id number]. Topic: General - Call Back - No Documentation >> Aug 16, 2024  4:15 PM Shanda MATSU wrote:  Reason for CRM: Patient returned call made to him, adv patient I do not show any notes where we contacted him, adv that I will send info to Clinical Team to see if they need to speak with him.

## 2024-08-16 NOTE — Telephone Encounter (Signed)
 Fax received from [employer].

## 2024-08-19 ENCOUNTER — Ambulatory Visit: Attending: Cardiology

## 2024-08-19 DIAGNOSIS — Z95810 Presence of automatic (implantable) cardiac defibrillator: Secondary | ICD-10-CM

## 2024-08-19 DIAGNOSIS — I5022 Chronic systolic (congestive) heart failure: Secondary | ICD-10-CM | POA: Diagnosis not present

## 2024-08-20 NOTE — Progress Notes (Signed)
 EPIC Encounter for ICM Monitoring  Patient Name: Stephen Jennings is a 68 y.o. male Date: 08/20/2024 Primary Care Physican: Vicci Barnie NOVAK, MD Primary Cardiologist: [facility] Electrophysiologist: [facility] 08/24/2022 Weight: 194 lbs (baseline 194-195) 12/26/2023 Office Weight: 198 lbs   04/23/2024 Weight: 195 lbs      05/02/2024 Office Visit: 196 lbs           07/05/2024 Office Weight: 195 lbs                             Transmission results reviewed.    Diet:  N/A   Since 07/08/2024 ICM Remote Transmission: CorVue thoracic impedance suggesting normal fluid levels with the exception of possible fluid accumulation 08/02/2024-08/08/2024.   Prescribed:  Furosemide  20 mg take 1 tablet (20 mg total) by mouth daily as needed (for swelling or weight gain of 3 lbs overnight or 5 pounds in a week)   Labs: 07/30/2024 Creatinine 1.24, BUN 17, Potassium 3.6, Sodium 140, GFR >60 07/15/2024 Creatinine 1.24, BUN 24, Potassium 3.9, Sodium 139, GFR >60 07/01/2024 Creatinine 1.10, BUN 18, Potassium 4.9, Sodium 140, GFR 73  06/26/2024 Creatinine 1.57, BUN 31, Potassium 3.5, Sodium 138  A complete set of results can be found in Results Review.   Recommendations:  No changes.   Follow-up plan: ICM clinic phone appointment on 09/23/2024.   91 day device clinic remote transmission 10/16/2024.     EP/Cardiology Office Visits:     Recall 11/27/2024 with Dr Cherrie.   Recall 10/07/2024 with Jodie Passey, PA.  Recall 06/27/2024 with Dr Anner.   Copy of ICM check sent to Dr. Inocencio.   Remote monitoring is medically necessary for Heart Failure Management.    Daily Thoracic Impedance ICM trend: 05/21/2024 through 08/19/2024.    12-14 Month Thoracic Impedance ICM trend:     Mitzie GORMAN Garner, RN 08/20/2024 9:39 AM

## 2024-08-20 NOTE — Telephone Encounter (Signed)
 Completed form left on your desk this a.m.

## 2024-08-21 NOTE — Telephone Encounter (Signed)
 Updated form faxed to [facility] on 08/20/2024. Fax #: [phone].

## 2024-08-29 ENCOUNTER — Ambulatory Visit (HOSPITAL_COMMUNITY)
Admission: RE | Admit: 2024-08-29 | Discharge: 2024-08-29 | Disposition: A | Discharge status: Home / Self Care | Source: Ambulatory Visit | Attending: Internal Medicine | Admitting: Internal Medicine

## 2024-08-29 DIAGNOSIS — I5022 Chronic systolic (congestive) heart failure: Secondary | ICD-10-CM | POA: Diagnosis not present

## 2024-08-29 LAB — BASIC METABOLIC PANEL WITH GFR
Anion gap: 8 (ref 5–15)
BUN: 14 mg/dL (ref 8–23)
CO2: 27 mmol/L (ref 22–32)
Calcium: 9.3 mg/dL (ref 8.9–10.3)
Chloride: 106 mmol/L (ref 98–111)
Creatinine, Ser: 1.08 mg/dL (ref 0.61–1.24)
GFR, Estimated: >60 mL/min
Glucose, Bld: 120 mg/dL — ABNORMAL HIGH (ref 70–99)
Potassium: 4.5 mmol/L (ref 3.5–5.1)
Sodium: 142 mmol/L (ref 135–145)

## 2024-08-30 ENCOUNTER — Telehealth (HOSPITAL_COMMUNITY): Payer: Self-pay

## 2024-08-30 ENCOUNTER — Other Ambulatory Visit (HOSPITAL_COMMUNITY): Payer: Self-pay

## 2024-08-30 MED ORDER — SACUBITRIL-VALSARTAN 49-51 MG PO TABS: 1.0000 | ORAL_TABLET | Freq: Two times a day (BID) | ORAL | 1 refills | Status: AC

## 2024-08-30 MED ORDER — JARDIANCE 10 MG PO TABS: 10.0000 mg | ORAL_TABLET | Freq: Every day | ORAL | 1 refills | Status: AC

## 2024-08-30 NOTE — Addendum Note (Signed)
 Addended by: Jhayden Demuro, DALTON HERO on: 08/30/2024 03:56 PM   Modules accepted: Orders

## 2024-08-30 NOTE — Telephone Encounter (Signed)
 Advanced Heart Failure Patient Advocate Encounter  The patient was approved for a [employer] grant that will help cover the cost of Entresto , Eplerenone , Jardiance , Metoprolol .  Total amount awarded, $7,500.  Effective: 07/31/2024 - 07/30/2025.  BIN [id number] PCN [id number] Group [id number] ID [id number]  Pharmacy provided with approval and processing information. Patient informed via phone.  Rachel DEL, CPhT Rx Patient Advocate Phone: [phone]

## 2024-09-02 ENCOUNTER — Other Ambulatory Visit (HOSPITAL_COMMUNITY): Payer: Self-pay

## 2024-09-02 ENCOUNTER — Telehealth: Payer: Self-pay | Admitting: Pharmacy Technician

## 2024-09-02 NOTE — Telephone Encounter (Signed)
 Pharmacy Patient Advocate Encounter   Received notification from [facility] KEY that prior authorization for repatha  is required/requested.   Insurance verification completed.   The patient is insured through [employer].   Per test claim: PA required; PA submitted to above mentioned insurance via Latent Key/confirmation #/EOC [id number] Status is pending

## 2024-09-02 NOTE — Progress Notes (Signed)
 Stephen Jennings                                          MRN: [id number]   09/02/2024   The VBCI Quality Team Specialist reviewed this patient medical record for the purposes of chart review for care gap closure. The following were reviewed: abstraction for care gap closure-glycemic status assessment.    VBCI Quality Team

## 2024-09-02 NOTE — Telephone Encounter (Signed)
 Pharmacy Patient Advocate Encounter  Received notification from [employer] that Prior Authorization for repatha  has been APPROVED from 08/22/24 to 09/02/25   PA #/Case ID/Reference #: [id number]

## 2024-09-06 ENCOUNTER — Other Ambulatory Visit: Payer: Self-pay | Admitting: Pulmonary Disease

## 2024-09-06 DIAGNOSIS — J069 Acute upper respiratory infection, unspecified: Secondary | ICD-10-CM

## 2024-09-13 ENCOUNTER — Other Ambulatory Visit: Payer: Self-pay | Admitting: Pulmonary Disease

## 2024-09-13 DIAGNOSIS — J069 Acute upper respiratory infection, unspecified: Secondary | ICD-10-CM

## 2024-09-17 ENCOUNTER — Telehealth: Payer: Self-pay | Admitting: Internal Medicine

## 2024-09-17 DIAGNOSIS — R0609 Other forms of dyspnea: Secondary | ICD-10-CM

## 2024-09-17 MED ORDER — TRELEGY ELLIPTA 100-62.5-25 MCG/ACT IN AEPB: INHALATION_SPRAY | RESPIRATORY_TRACT | 4 refills | Status: DC

## 2024-09-17 NOTE — Addendum Note (Signed)
 Addended by: VICCI SOBER B on: 09/17/2024 01:07 PM   Modules accepted: Orders

## 2024-09-17 NOTE — Telephone Encounter (Signed)
 Insurance will no longer pay for Preztri inhaler. Changed to Trelegy Inhaler. Rxn sent to pharmacy.

## 2024-09-17 NOTE — Telephone Encounter (Signed)
 Copied from CRM #[id number]. Topic: Clinical - Prescription Issue >> Sep 17, 2024 11:35 AM   Wess RAMAN wrote:  Reason for CRM: Nahal from [facility] stated patient's insurance no longer covers budesonide -glycopyrrolate -formoterol  (BREZTRI  AEROSPHERE) 160-9-4.8 MCG/ACT AERO inhaler. They will need to be switched to Trelegy Ellipta  (fluticasone  furoate/umeclidinium/vilanterol).

## 2024-09-18 NOTE — Telephone Encounter (Signed)
 Called & spoke to the patient. Verified name & DOB. Informed that insurance will no longer pay for Preztri inhaler. Changed to Trelegy Inhaler. Rxn sent to pharmacy. Patient expressed verbal understanding of all discussed.

## 2024-09-19 MED ORDER — BREZTRI AEROSPHERE 160-9-4.8 MCG/ACT IN AERO: 2.0000 | INHALATION_SPRAY | Freq: Two times a day (BID) | RESPIRATORY_TRACT | 4 refills | Status: DC

## 2024-09-19 NOTE — Telephone Encounter (Signed)
 Routing to PCP for review.

## 2024-09-19 NOTE — Telephone Encounter (Signed)
 Patient states Fluticasone -Umeclidin-Vilant (TRELEGY ELLIPTA ) 100-62.5-25 MCG/ACT AEPB will cost him $149.94, patient would like alternate prescription due to the cost.   Informed patient to contact [employer] to inquire about alternates that would be cover with a lower cost. Patient voiced understanding and will follow up.      [facility] [address] ([address] [place] - [address] Phone: [phone]  Fax: [phone]

## 2024-09-19 NOTE — Addendum Note (Signed)
 Addended by: VICCI SOBER B on: 09/19/2024 09:18 PM   Modules accepted: Orders

## 2024-09-19 NOTE — Telephone Encounter (Signed)
 His pulmonary physician had sent one in called Breztri  in 05/2024. Was he able to get that one instead?

## 2024-09-19 NOTE — Progress Notes (Signed)
 31 day ICM Remote transmission canceled due to [facility] clinic is on hold until further notice.  91 day remote monitoring will continue per protocol.

## 2024-09-23 ENCOUNTER — Telehealth: Payer: Self-pay | Admitting: *Deleted

## 2024-09-23 ENCOUNTER — Other Ambulatory Visit: Payer: Self-pay

## 2024-09-23 NOTE — Transitions of Care (Post Inpatient/ED Visit) (Signed)
" ° °  09/23/2024  Name: Nguyen Todorov MRN: [id number] DOB: 02-14-1956  RNCM returning call to Mr. Bistline. Mr. Shafer expresses concern regarding cost of Breztri  inhaler with new insurance will be over $160 per month. RNCM advised contacting Pulmonology office to inquire about cost effective options and contact [facility] [phone] to inquire about pricing. Mr. Ferrentino expressed understanding and will call today.  Andrea Dimes RN, BSN [place]  [facility] RN Care Manager [phone]  "

## 2024-09-24 NOTE — Telephone Encounter (Signed)
 Called but no answer. LVM to call back.

## 2024-09-25 ENCOUNTER — Telehealth: Payer: Self-pay

## 2024-09-25 NOTE — Telephone Encounter (Signed)
 Herlene can you assist with this? Insurance no longer pays for Breztri  and co-pay for trelegy too high.  Has [employer] Medicare with [facility]. Would Symbicort  or Dulera be covered with [employer]?

## 2024-09-25 NOTE — Telephone Encounter (Unsigned)
 Copied from CRM #[id number]. Topic: Clinical - Prescription Issue >> Sep 23, 2024  4:41 PM Alfonso ORN wrote: Reason for CRM: inhaler , new insurance this year copay is now too expensive and wants to see if it can be replaced

## 2024-09-25 NOTE — Telephone Encounter (Signed)
 I called and spoke with patient, he changed insurance companies and he no longer has [employer].  He now has [employer] and he cannot afford the Breztri  inhaler.  This is the information from his insurance card:  ID#:  [id number], Group #:  [id number], CMS#:  Y4700-982, Issuer #:  [id number].  I let him know that I would provide this information to our pharmacy team to find out what is covered under his new insurance that is comparable to Breztri  and once we hear back from them, we will send a message to his doctor with the options for a different inhaler.  He verbalized understanding.  Pharmacy team, please advise regarding a different inhaler comparable to Breztri  with his new insurance:  [employer]:  ID#:  [id number]    Group #:  [id number]    CMS#:  [id number] Issuer #:  [phone]  Patient and son did not see a BIN # on the card.  Thank you.

## 2024-09-26 ENCOUNTER — Other Ambulatory Visit: Payer: Self-pay | Admitting: Pharmacist

## 2024-09-26 ENCOUNTER — Other Ambulatory Visit: Payer: Self-pay

## 2024-09-26 ENCOUNTER — Other Ambulatory Visit (HOSPITAL_COMMUNITY): Payer: Self-pay

## 2024-09-26 MED ORDER — BREZTRI AEROSPHERE 160-9-4.8 MCG/ACT IN AERO: 2.0000 | INHALATION_SPRAY | Freq: Two times a day (BID) | RESPIRATORY_TRACT | 11 refills | Status: AC

## 2024-09-26 MED ORDER — BREZTRI AEROSPHERE 160-9-4.8 MCG/ACT IN AERO
2.0000 | INHALATION_SPRAY | Freq: Two times a day (BID) | RESPIRATORY_TRACT | 4 refills | Status: AC
  Filled 2024-09-26: qty 10.7, 30d supply, fill #0

## 2024-09-26 NOTE — Telephone Encounter (Signed)
 Tried to reach out to patient - Vm/LM -return call    Please see pharmacy note from 09/26/24

## 2024-09-26 NOTE — Telephone Encounter (Signed)
 Clarisa: let pt know that he can get Breztri  inhaler through our pharmacy. Will have to come in and sign up for the Patient Assistance Program with [employer].

## 2024-09-26 NOTE — Addendum Note (Signed)
 Addended by: VICCI SOBER B on: 09/26/2024 08:58 AM   Modules accepted: Orders

## 2024-09-26 NOTE — Telephone Encounter (Signed)
 Called & spoke to the patient. Verified name & DOB. Informed that he can get Breztri  inhaler through our pharmacy. Will have to come in and sign up for the Patient Assistance Program with [employer]. Patient expressed verbal understanding of all discussed.

## 2024-09-27 ENCOUNTER — Other Ambulatory Visit: Payer: Self-pay

## 2024-10-16 ENCOUNTER — Ambulatory Visit: Payer: Medicare HMO

## 2024-11-25 ENCOUNTER — Ambulatory Visit: Admitting: Internal Medicine
